# Patient Record
Sex: Female | Born: 1960
Health system: Southern US, Community
[De-identification: ages and names within clinical notes are randomized; demographics above are authoritative.]

## PROBLEM LIST (undated history)

## (undated) DIAGNOSIS — R7611 Nonspecific reaction to tuberculin skin test without active tuberculosis: Secondary | ICD-10-CM

## (undated) DIAGNOSIS — M869 Osteomyelitis, unspecified: Secondary | ICD-10-CM

## (undated) DIAGNOSIS — M545 Low back pain, unspecified: Secondary | ICD-10-CM

## (undated) DIAGNOSIS — D126 Benign neoplasm of colon, unspecified: Secondary | ICD-10-CM

## (undated) DIAGNOSIS — T7840XA Allergy, unspecified, initial encounter: Secondary | ICD-10-CM

## (undated) DIAGNOSIS — R9431 Abnormal electrocardiogram [ECG] [EKG]: Secondary | ICD-10-CM

## (undated) DIAGNOSIS — E0500 Thyrotoxicosis with diffuse goiter without thyrotoxic crisis or storm: Secondary | ICD-10-CM

## (undated) DIAGNOSIS — K5790 Diverticulosis of intestine, part unspecified, without perforation or abscess without bleeding: Secondary | ICD-10-CM

## (undated) DIAGNOSIS — I10 Essential (primary) hypertension: Secondary | ICD-10-CM

## (undated) DIAGNOSIS — Z85038 Personal history of other malignant neoplasm of large intestine: Secondary | ICD-10-CM

## (undated) DIAGNOSIS — G5621 Lesion of ulnar nerve, right upper limb: Secondary | ICD-10-CM

## (undated) DIAGNOSIS — G4730 Sleep apnea, unspecified: Secondary | ICD-10-CM

## (undated) DIAGNOSIS — D649 Anemia, unspecified: Secondary | ICD-10-CM

## (undated) DIAGNOSIS — E0590 Thyrotoxicosis, unspecified without thyrotoxic crisis or storm: Secondary | ICD-10-CM

## (undated) DIAGNOSIS — K219 Gastro-esophageal reflux disease without esophagitis: Secondary | ICD-10-CM

## (undated) DIAGNOSIS — M25562 Pain in left knee: Secondary | ICD-10-CM

## (undated) DIAGNOSIS — I639 Cerebral infarction, unspecified: Secondary | ICD-10-CM

## (undated) DIAGNOSIS — G8929 Other chronic pain: Secondary | ICD-10-CM

## (undated) DIAGNOSIS — Z89422 Acquired absence of other left toe(s): Secondary | ICD-10-CM

## (undated) DIAGNOSIS — K2970 Gastritis, unspecified, without bleeding: Secondary | ICD-10-CM

## (undated) DIAGNOSIS — M5010 Cervical disc disorder with radiculopathy, unspecified cervical region: Secondary | ICD-10-CM

## (undated) DIAGNOSIS — S161XXA Strain of muscle, fascia and tendon at neck level, initial encounter: Secondary | ICD-10-CM

## (undated) DIAGNOSIS — R002 Palpitations: Secondary | ICD-10-CM

## (undated) DIAGNOSIS — E039 Hypothyroidism, unspecified: Secondary | ICD-10-CM

## (undated) DIAGNOSIS — G43909 Migraine, unspecified, not intractable, without status migrainosus: Secondary | ICD-10-CM

## (undated) DIAGNOSIS — K449 Diaphragmatic hernia without obstruction or gangrene: Secondary | ICD-10-CM

## (undated) DIAGNOSIS — Z8719 Personal history of other diseases of the digestive system: Secondary | ICD-10-CM

## (undated) HISTORY — DX: Abnormal electrocardiogram (ECG) (EKG): R94.31

## (undated) HISTORY — PX: UPPER GASTROINTESTINAL ENDOSCOPY: SHX188

## (undated) HISTORY — PX: CHOLECYSTECTOMY: SHX55

## (undated) HISTORY — DX: Personal history of other malignant neoplasm of large intestine: Z85.038

## (undated) HISTORY — PX: COLONOSCOPY: SHX174

## (undated) HISTORY — DX: Benign neoplasm of colon, unspecified: D12.6

## (undated) HISTORY — DX: Strain of muscle, fascia and tendon at neck level, initial encounter: S16.1XXA

## (undated) HISTORY — DX: Lesion of ulnar nerve, right upper limb: G56.21

## (undated) HISTORY — DX: Palpitations: R00.2

## (undated) HISTORY — DX: Osteomyelitis, unspecified: M86.9

## (undated) HISTORY — PX: GALLBLADDER SURGERY: SHX652

## (undated) HISTORY — DX: Gastritis, unspecified, without bleeding: K29.70

## (undated) HISTORY — DX: Cervical disc disorder with radiculopathy, unspecified cervical region: M50.10

## (undated) HISTORY — DX: Diverticulosis of intestine, part unspecified, without perforation or abscess without bleeding: K57.90

## (undated) HISTORY — DX: Allergy, unspecified, initial encounter: T78.40XA

## (undated) HISTORY — DX: Sleep apnea, unspecified: G47.30

## (undated) HISTORY — DX: Cerebral infarction, unspecified: I63.9

## (undated) HISTORY — DX: Pain in left knee: M25.562

## (undated) HISTORY — DX: Acquired absence of other left toe(s): Z89.422

---

## 1988-06-27 HISTORY — PX: TUBAL LIGATION: SHX77

## 1998-01-22 ENCOUNTER — Encounter: Admission: RE | Admit: 1998-01-22 | Discharge: 1998-01-22 | Payer: Self-pay | Admitting: Family Medicine

## 1998-01-24 ENCOUNTER — Emergency Department (HOSPITAL_COMMUNITY): Admission: EM | Admit: 1998-01-24 | Discharge: 1998-01-25 | Payer: Self-pay | Admitting: Emergency Medicine

## 1998-05-04 ENCOUNTER — Encounter: Admission: RE | Admit: 1998-05-04 | Discharge: 1998-05-04 | Payer: Self-pay | Admitting: Family Medicine

## 1998-05-04 ENCOUNTER — Other Ambulatory Visit: Admission: RE | Admit: 1998-05-04 | Discharge: 1998-05-04 | Payer: Self-pay | Admitting: *Deleted

## 1998-11-23 ENCOUNTER — Encounter: Admission: RE | Admit: 1998-11-23 | Discharge: 1998-11-23 | Payer: Self-pay | Admitting: Family Medicine

## 1999-05-17 ENCOUNTER — Encounter: Admission: RE | Admit: 1999-05-17 | Discharge: 1999-05-17 | Payer: Self-pay | Admitting: Family Medicine

## 1999-05-31 ENCOUNTER — Encounter: Admission: RE | Admit: 1999-05-31 | Discharge: 1999-05-31 | Payer: Self-pay | Admitting: Family Medicine

## 2000-01-19 ENCOUNTER — Emergency Department (HOSPITAL_COMMUNITY): Admission: EM | Admit: 2000-01-19 | Discharge: 2000-01-19 | Payer: Self-pay | Admitting: Emergency Medicine

## 2000-02-01 ENCOUNTER — Encounter: Admission: RE | Admit: 2000-02-01 | Discharge: 2000-02-01 | Payer: Self-pay | Admitting: Family Medicine

## 2000-02-08 ENCOUNTER — Encounter: Admission: RE | Admit: 2000-02-08 | Discharge: 2000-02-08 | Payer: Self-pay | Admitting: Family Medicine

## 2000-03-08 ENCOUNTER — Encounter: Admission: RE | Admit: 2000-03-08 | Discharge: 2000-03-08 | Payer: Self-pay | Admitting: Family Medicine

## 2000-03-22 ENCOUNTER — Encounter: Admission: RE | Admit: 2000-03-22 | Discharge: 2000-03-22 | Payer: Self-pay | Admitting: Family Medicine

## 2000-04-21 ENCOUNTER — Encounter: Admission: RE | Admit: 2000-04-21 | Discharge: 2000-04-21 | Payer: Self-pay | Admitting: Family Medicine

## 2000-04-28 ENCOUNTER — Encounter: Admission: RE | Admit: 2000-04-28 | Discharge: 2000-04-28 | Payer: Self-pay | Admitting: *Deleted

## 2000-07-06 ENCOUNTER — Encounter: Admission: RE | Admit: 2000-07-06 | Discharge: 2000-07-06 | Payer: Self-pay | Admitting: Family Medicine

## 2000-07-28 ENCOUNTER — Encounter (INDEPENDENT_AMBULATORY_CARE_PROVIDER_SITE_OTHER): Payer: Self-pay | Admitting: *Deleted

## 2000-07-28 LAB — CONVERTED CEMR LAB

## 2000-08-06 ENCOUNTER — Emergency Department (HOSPITAL_COMMUNITY): Admission: EM | Admit: 2000-08-06 | Discharge: 2000-08-06 | Payer: Self-pay | Admitting: Internal Medicine

## 2000-08-06 ENCOUNTER — Encounter: Payer: Self-pay | Admitting: Internal Medicine

## 2000-08-11 ENCOUNTER — Other Ambulatory Visit: Admission: RE | Admit: 2000-08-11 | Discharge: 2000-08-11 | Payer: Self-pay | Admitting: Family Medicine

## 2000-09-06 ENCOUNTER — Encounter: Admission: RE | Admit: 2000-09-06 | Discharge: 2000-09-06 | Payer: Self-pay | Admitting: Family Medicine

## 2001-05-17 ENCOUNTER — Other Ambulatory Visit: Admission: RE | Admit: 2001-05-17 | Discharge: 2001-06-11 | Payer: Self-pay | Admitting: Unknown Physician Specialty

## 2001-05-21 ENCOUNTER — Ambulatory Visit (HOSPITAL_COMMUNITY): Admission: RE | Admit: 2001-05-21 | Discharge: 2001-05-21 | Payer: Self-pay | Admitting: Family Medicine

## 2001-05-22 ENCOUNTER — Ambulatory Visit (HOSPITAL_COMMUNITY): Admission: RE | Admit: 2001-05-22 | Discharge: 2001-05-22 | Payer: Self-pay | Admitting: *Deleted

## 2001-05-22 ENCOUNTER — Encounter: Payer: Self-pay | Admitting: *Deleted

## 2001-10-20 ENCOUNTER — Emergency Department (HOSPITAL_COMMUNITY): Admission: EM | Admit: 2001-10-20 | Discharge: 2001-10-20 | Payer: Self-pay | Admitting: Emergency Medicine

## 2001-10-22 ENCOUNTER — Encounter: Payer: Self-pay | Admitting: Emergency Medicine

## 2001-10-22 ENCOUNTER — Emergency Department (HOSPITAL_COMMUNITY): Admission: EM | Admit: 2001-10-22 | Discharge: 2001-10-22 | Payer: Self-pay | Admitting: Emergency Medicine

## 2002-01-08 ENCOUNTER — Encounter: Admission: RE | Admit: 2002-01-08 | Discharge: 2002-04-08 | Payer: Self-pay | Admitting: Internal Medicine

## 2002-02-14 ENCOUNTER — Encounter: Payer: Self-pay | Admitting: Internal Medicine

## 2002-02-14 ENCOUNTER — Encounter: Admission: RE | Admit: 2002-02-14 | Discharge: 2002-02-14 | Payer: Self-pay | Admitting: Internal Medicine

## 2002-08-12 ENCOUNTER — Other Ambulatory Visit: Admission: RE | Admit: 2002-08-12 | Discharge: 2002-08-12 | Payer: Self-pay | Admitting: Obstetrics and Gynecology

## 2002-12-07 ENCOUNTER — Emergency Department (HOSPITAL_COMMUNITY): Admission: AD | Admit: 2002-12-07 | Discharge: 2002-12-07 | Payer: Self-pay

## 2003-01-25 ENCOUNTER — Emergency Department (HOSPITAL_COMMUNITY): Admission: EM | Admit: 2003-01-25 | Discharge: 2003-01-25 | Payer: Self-pay | Admitting: Emergency Medicine

## 2003-03-25 ENCOUNTER — Encounter: Admission: RE | Admit: 2003-03-25 | Discharge: 2003-03-25 | Payer: Self-pay | Admitting: Internal Medicine

## 2003-03-25 ENCOUNTER — Encounter: Payer: Self-pay | Admitting: Internal Medicine

## 2003-04-14 ENCOUNTER — Emergency Department (HOSPITAL_COMMUNITY): Admission: AD | Admit: 2003-04-14 | Discharge: 2003-04-14 | Payer: Self-pay | Admitting: Family Medicine

## 2003-04-27 ENCOUNTER — Emergency Department (HOSPITAL_COMMUNITY): Admission: EM | Admit: 2003-04-27 | Discharge: 2003-04-27 | Payer: Self-pay | Admitting: Emergency Medicine

## 2003-10-16 ENCOUNTER — Emergency Department (HOSPITAL_COMMUNITY): Admission: EM | Admit: 2003-10-16 | Discharge: 2003-10-16 | Payer: Self-pay | Admitting: Emergency Medicine

## 2003-12-09 ENCOUNTER — Encounter: Admission: RE | Admit: 2003-12-09 | Discharge: 2003-12-09 | Payer: Self-pay | Admitting: Internal Medicine

## 2004-06-27 HISTORY — PX: ABDOMINAL HYSTERECTOMY: SHX81

## 2004-06-27 HISTORY — PX: APPENDECTOMY: SHX54

## 2004-07-31 ENCOUNTER — Emergency Department (HOSPITAL_COMMUNITY): Admission: EM | Admit: 2004-07-31 | Discharge: 2004-07-31 | Payer: Self-pay | Admitting: Emergency Medicine

## 2004-08-07 ENCOUNTER — Ambulatory Visit (HOSPITAL_COMMUNITY): Admission: RE | Admit: 2004-08-07 | Discharge: 2004-08-07 | Payer: Self-pay | Admitting: Internal Medicine

## 2004-09-18 ENCOUNTER — Emergency Department (HOSPITAL_COMMUNITY): Admission: EM | Admit: 2004-09-18 | Discharge: 2004-09-18 | Payer: Self-pay | Admitting: Emergency Medicine

## 2004-09-20 ENCOUNTER — Emergency Department (HOSPITAL_COMMUNITY): Admission: EM | Admit: 2004-09-20 | Discharge: 2004-09-21 | Payer: Self-pay | Admitting: Emergency Medicine

## 2004-09-21 ENCOUNTER — Inpatient Hospital Stay (HOSPITAL_COMMUNITY): Admission: AD | Admit: 2004-09-21 | Discharge: 2004-09-24 | Payer: Self-pay | Admitting: Internal Medicine

## 2004-10-18 ENCOUNTER — Other Ambulatory Visit: Admission: RE | Admit: 2004-10-18 | Discharge: 2004-10-18 | Payer: Self-pay | Admitting: Obstetrics and Gynecology

## 2004-11-02 ENCOUNTER — Encounter (INDEPENDENT_AMBULATORY_CARE_PROVIDER_SITE_OTHER): Payer: Self-pay | Admitting: Specialist

## 2004-11-02 ENCOUNTER — Inpatient Hospital Stay (HOSPITAL_COMMUNITY): Admission: RE | Admit: 2004-11-02 | Discharge: 2004-11-04 | Payer: Self-pay | Admitting: Obstetrics and Gynecology

## 2004-11-12 ENCOUNTER — Inpatient Hospital Stay (HOSPITAL_COMMUNITY): Admission: AD | Admit: 2004-11-12 | Discharge: 2004-11-12 | Payer: Self-pay | Admitting: Obstetrics and Gynecology

## 2004-11-14 ENCOUNTER — Encounter: Payer: Self-pay | Admitting: Obstetrics and Gynecology

## 2004-11-14 ENCOUNTER — Observation Stay (HOSPITAL_COMMUNITY): Admission: AD | Admit: 2004-11-14 | Discharge: 2004-11-15 | Payer: Self-pay | Admitting: General Surgery

## 2004-11-15 ENCOUNTER — Encounter (INDEPENDENT_AMBULATORY_CARE_PROVIDER_SITE_OTHER): Payer: Self-pay | Admitting: Specialist

## 2005-10-17 ENCOUNTER — Encounter (HOSPITAL_COMMUNITY): Admission: RE | Admit: 2005-10-17 | Discharge: 2006-01-15 | Payer: Self-pay | Admitting: Internal Medicine

## 2006-03-24 ENCOUNTER — Emergency Department (HOSPITAL_COMMUNITY): Admission: EM | Admit: 2006-03-24 | Discharge: 2006-03-24 | Payer: Self-pay | Admitting: Emergency Medicine

## 2006-08-14 ENCOUNTER — Ambulatory Visit (HOSPITAL_BASED_OUTPATIENT_CLINIC_OR_DEPARTMENT_OTHER): Admission: RE | Admit: 2006-08-14 | Discharge: 2006-08-14 | Payer: Self-pay | Admitting: Ophthalmology

## 2006-08-25 ENCOUNTER — Encounter (INDEPENDENT_AMBULATORY_CARE_PROVIDER_SITE_OTHER): Payer: Self-pay | Admitting: *Deleted

## 2006-12-27 ENCOUNTER — Encounter: Admission: RE | Admit: 2006-12-27 | Discharge: 2006-12-27 | Payer: Self-pay | Admitting: Internal Medicine

## 2007-01-01 ENCOUNTER — Encounter: Admission: RE | Admit: 2007-01-01 | Discharge: 2007-04-01 | Payer: Self-pay | Admitting: Internal Medicine

## 2007-01-03 ENCOUNTER — Emergency Department (HOSPITAL_COMMUNITY): Admission: EM | Admit: 2007-01-03 | Discharge: 2007-01-03 | Payer: Self-pay | Admitting: Emergency Medicine

## 2007-04-30 ENCOUNTER — Encounter: Admission: RE | Admit: 2007-04-30 | Discharge: 2007-04-30 | Payer: Self-pay | Admitting: Internal Medicine

## 2007-05-03 ENCOUNTER — Encounter: Admission: RE | Admit: 2007-05-03 | Discharge: 2007-05-03 | Payer: Self-pay | Admitting: Internal Medicine

## 2007-05-25 ENCOUNTER — Emergency Department (HOSPITAL_COMMUNITY): Admission: EM | Admit: 2007-05-25 | Discharge: 2007-05-25 | Payer: Self-pay | Admitting: Emergency Medicine

## 2007-06-06 ENCOUNTER — Encounter: Admission: RE | Admit: 2007-06-06 | Discharge: 2007-06-06 | Payer: Self-pay | Admitting: Orthopaedic Surgery

## 2007-09-21 ENCOUNTER — Encounter: Admission: RE | Admit: 2007-09-21 | Discharge: 2007-09-21 | Payer: Self-pay | Admitting: Obstetrics and Gynecology

## 2008-08-10 ENCOUNTER — Emergency Department (HOSPITAL_COMMUNITY): Admission: EM | Admit: 2008-08-10 | Discharge: 2008-08-10 | Payer: Self-pay | Admitting: Emergency Medicine

## 2009-04-07 ENCOUNTER — Emergency Department (HOSPITAL_COMMUNITY): Admission: EM | Admit: 2009-04-07 | Discharge: 2009-04-07 | Payer: Self-pay | Admitting: Emergency Medicine

## 2009-04-09 ENCOUNTER — Emergency Department (HOSPITAL_COMMUNITY): Admission: EM | Admit: 2009-04-09 | Discharge: 2009-04-09 | Payer: Self-pay | Admitting: Emergency Medicine

## 2009-09-29 ENCOUNTER — Emergency Department (HOSPITAL_COMMUNITY): Admission: EM | Admit: 2009-09-29 | Discharge: 2009-09-29 | Payer: Self-pay | Admitting: Emergency Medicine

## 2010-02-09 ENCOUNTER — Emergency Department (HOSPITAL_COMMUNITY): Admission: EM | Admit: 2010-02-09 | Discharge: 2010-02-10 | Payer: Self-pay | Admitting: Emergency Medicine

## 2010-07-18 ENCOUNTER — Encounter: Payer: Self-pay | Admitting: Internal Medicine

## 2010-09-09 LAB — DIFFERENTIAL
Basophils Absolute: 0 10*3/uL (ref 0.0–0.1)
Basophils Relative: 0 % (ref 0–1)
Eosinophils Absolute: 0.1 10*3/uL (ref 0.0–0.7)
Eosinophils Relative: 1 % (ref 0–5)
Lymphocytes Relative: 30 % (ref 12–46)
Lymphs Abs: 1.8 10*3/uL (ref 0.7–4.0)
Monocytes Absolute: 0.4 10*3/uL (ref 0.1–1.0)
Monocytes Relative: 7 % (ref 3–12)
Neutro Abs: 3.8 10*3/uL (ref 1.7–7.7)
Neutrophils Relative %: 63 % (ref 43–77)

## 2010-09-09 LAB — URINALYSIS, ROUTINE W REFLEX MICROSCOPIC
Bilirubin Urine: NEGATIVE
Glucose, UA: NEGATIVE mg/dL
Ketones, ur: NEGATIVE mg/dL
Leukocytes, UA: NEGATIVE
Nitrite: NEGATIVE
Protein, ur: NEGATIVE mg/dL
Specific Gravity, Urine: 1.015 (ref 1.005–1.030)
Urobilinogen, UA: 1 mg/dL (ref 0.0–1.0)
pH: 6.5 (ref 5.0–8.0)

## 2010-09-09 LAB — RAPID URINE DRUG SCREEN, HOSP PERFORMED
Amphetamines: NOT DETECTED
Barbiturates: NOT DETECTED
Benzodiazepines: NOT DETECTED
Cocaine: NOT DETECTED
Opiates: NOT DETECTED
Tetrahydrocannabinol: NOT DETECTED

## 2010-09-09 LAB — COMPREHENSIVE METABOLIC PANEL
ALT: 19 U/L (ref 0–35)
AST: 26 U/L (ref 0–37)
Albumin: 3.6 g/dL (ref 3.5–5.2)
Alkaline Phosphatase: 58 U/L (ref 39–117)
BUN: 10 mg/dL (ref 6–23)
CO2: 29 mEq/L (ref 19–32)
Calcium: 9.1 mg/dL (ref 8.4–10.5)
Chloride: 104 mEq/L (ref 96–112)
Creatinine, Ser: 0.97 mg/dL (ref 0.4–1.2)
GFR calc Af Amer: >60 mL/min (ref >60)
GFR calc non Af Amer: >60 mL/min (ref >60)
Glucose, Bld: 186 mg/dL — ABNORMAL HIGH (ref 70–99)
Potassium: 3.2 mEq/L — ABNORMAL LOW (ref 3.5–5.1)
Sodium: 139 mEq/L (ref 135–145)
Total Bilirubin: 0.8 mg/dL (ref 0.3–1.2)
Total Protein: 6.5 g/dL (ref 6.0–8.3)

## 2010-09-09 LAB — CBC
HCT: 37.4 % (ref 36.0–46.0)
Hemoglobin: 12.6 g/dL (ref 12.0–15.0)
MCH: 27.6 pg (ref 26.0–34.0)
MCHC: 33.7 g/dL (ref 30.0–36.0)
MCV: 81.8 fL (ref 78.0–100.0)
Platelets: 271 10*3/uL (ref 150–400)
RBC: 4.57 MIL/uL (ref 3.87–5.11)
RDW: 13.9 % (ref 11.5–15.5)
WBC: 6.1 10*3/uL (ref 4.0–10.5)

## 2010-09-09 LAB — URINE MICROSCOPIC-ADD ON

## 2010-09-09 LAB — LIPASE, BLOOD: Lipase: 26 U/L (ref 11–59)

## 2010-09-15 LAB — DIFFERENTIAL
Basophils Absolute: 0 10*3/uL (ref 0.0–0.1)
Basophils Relative: 0 % (ref 0–1)
Eosinophils Absolute: 0.2 10*3/uL (ref 0.0–0.7)
Eosinophils Relative: 3 % (ref 0–5)
Lymphocytes Relative: 36 % (ref 12–46)
Lymphs Abs: 2.8 10*3/uL (ref 0.7–4.0)
Monocytes Absolute: 0.3 10*3/uL (ref 0.1–1.0)
Monocytes Relative: 4 % (ref 3–12)
Neutro Abs: 4.6 10*3/uL (ref 1.7–7.7)
Neutrophils Relative %: 57 % (ref 43–77)

## 2010-09-15 LAB — URINALYSIS, ROUTINE W REFLEX MICROSCOPIC
Bilirubin Urine: NEGATIVE
Glucose, UA: NEGATIVE mg/dL
Ketones, ur: NEGATIVE mg/dL
Leukocytes, UA: NEGATIVE
Nitrite: NEGATIVE
Protein, ur: NEGATIVE mg/dL
Specific Gravity, Urine: 1.014 (ref 1.005–1.030)
Urobilinogen, UA: 0.2 mg/dL (ref 0.0–1.0)
pH: 7 (ref 5.0–8.0)

## 2010-09-15 LAB — COMPREHENSIVE METABOLIC PANEL
ALT: 22 U/L (ref 0–35)
AST: 20 U/L (ref 0–37)
Albumin: 3.9 g/dL (ref 3.5–5.2)
Alkaline Phosphatase: 57 U/L (ref 39–117)
BUN: 16 mg/dL (ref 6–23)
CO2: 24 mEq/L (ref 19–32)
Calcium: 9.2 mg/dL (ref 8.4–10.5)
Chloride: 107 mEq/L (ref 96–112)
Creatinine, Ser: 0.89 mg/dL (ref 0.4–1.2)
GFR calc Af Amer: >60 mL/min (ref >60)
GFR calc non Af Amer: >60 mL/min (ref >60)
Glucose, Bld: 91 mg/dL (ref 70–99)
Potassium: 3.3 mEq/L — ABNORMAL LOW (ref 3.5–5.1)
Sodium: 139 mEq/L (ref 135–145)
Total Bilirubin: 0.5 mg/dL (ref 0.3–1.2)
Total Protein: 7 g/dL (ref 6.0–8.3)

## 2010-09-15 LAB — CBC
HCT: 40.1 % (ref 36.0–46.0)
Hemoglobin: 13.4 g/dL (ref 12.0–15.0)
MCHC: 33.3 g/dL (ref 30.0–36.0)
MCV: 84.4 fL (ref 78.0–100.0)
Platelets: 229 10*3/uL (ref 150–400)
RBC: 4.75 MIL/uL (ref 3.87–5.11)
RDW: 14.1 % (ref 11.5–15.5)
WBC: 7.9 10*3/uL (ref 4.0–10.5)

## 2010-09-15 LAB — CK: Total CK: 142 U/L (ref 7–177)

## 2010-09-15 LAB — URINE MICROSCOPIC-ADD ON

## 2010-09-30 LAB — URINALYSIS, ROUTINE W REFLEX MICROSCOPIC
Glucose, UA: NEGATIVE mg/dL
Hgb urine dipstick: NEGATIVE
Ketones, ur: NEGATIVE mg/dL
Nitrite: NEGATIVE
Protein, ur: NEGATIVE mg/dL
Specific Gravity, Urine: 1.027 (ref 1.005–1.030)
Urobilinogen, UA: 1 mg/dL (ref 0.0–1.0)
pH: 6 (ref 5.0–8.0)

## 2010-09-30 LAB — BASIC METABOLIC PANEL
BUN: 9 mg/dL (ref 6–23)
CO2: 28 mEq/L (ref 19–32)
Calcium: 9.8 mg/dL (ref 8.4–10.5)
Chloride: 107 mEq/L (ref 96–112)
Creatinine, Ser: 0.99 mg/dL (ref 0.4–1.2)
GFR calc Af Amer: >60 mL/min (ref >60)
GFR calc non Af Amer: >60 mL/min (ref >60)
Glucose, Bld: 89 mg/dL (ref 70–99)
Potassium: 3.5 mEq/L (ref 3.5–5.1)
Sodium: 142 mEq/L (ref 135–145)

## 2010-09-30 LAB — POCT I-STAT, CHEM 8
BUN: 16 mg/dL (ref 6–23)
Calcium, Ion: 1.18 mmol/L (ref 1.12–1.32)
Chloride: 107 mEq/L (ref 96–112)
Creatinine, Ser: 1 mg/dL (ref 0.4–1.2)
Glucose, Bld: 91 mg/dL (ref 70–99)
HCT: 39 % (ref 36.0–46.0)
Hemoglobin: 13.3 g/dL (ref 12.0–15.0)
Potassium: 3.5 mEq/L (ref 3.5–5.1)
Sodium: 141 mEq/L (ref 135–145)
TCO2: 23 mmol/L (ref 0–100)

## 2010-09-30 LAB — DIFFERENTIAL
Basophils Absolute: 0.1 10*3/uL (ref 0.0–0.1)
Basophils Relative: 1 % (ref 0–1)
Eosinophils Absolute: 0.2 10*3/uL (ref 0.0–0.7)
Eosinophils Relative: 2 % (ref 0–5)
Lymphocytes Relative: 32 % (ref 12–46)
Lymphs Abs: 2.8 10*3/uL (ref 0.7–4.0)
Monocytes Absolute: 0.4 10*3/uL (ref 0.1–1.0)
Monocytes Relative: 5 % (ref 3–12)
Neutro Abs: 5.2 10*3/uL (ref 1.7–7.7)
Neutrophils Relative %: 60 % (ref 43–77)

## 2010-09-30 LAB — CBC
HCT: 42.1 % (ref 36.0–46.0)
Hemoglobin: 14 g/dL (ref 12.0–15.0)
MCHC: 33.3 g/dL (ref 30.0–36.0)
MCV: 85.1 fL (ref 78.0–100.0)
Platelets: 236 10*3/uL (ref 150–400)
RBC: 4.95 MIL/uL (ref 3.87–5.11)
RDW: 13.8 % (ref 11.5–15.5)
WBC: 8.7 10*3/uL (ref 4.0–10.5)

## 2010-10-10 ENCOUNTER — Emergency Department (HOSPITAL_COMMUNITY): Payer: Self-pay

## 2010-10-10 ENCOUNTER — Emergency Department (HOSPITAL_COMMUNITY)
Admission: EM | Admit: 2010-10-10 | Discharge: 2010-10-10 | Disposition: A | Discharge status: Home / Self Care | Payer: Self-pay | Attending: Emergency Medicine | Admitting: Emergency Medicine

## 2010-10-10 DIAGNOSIS — M79609 Pain in unspecified limb: Secondary | ICD-10-CM | POA: Insufficient documentation

## 2010-10-10 DIAGNOSIS — F17200 Nicotine dependence, unspecified, uncomplicated: Secondary | ICD-10-CM | POA: Insufficient documentation

## 2010-10-10 DIAGNOSIS — R209 Unspecified disturbances of skin sensation: Secondary | ICD-10-CM | POA: Insufficient documentation

## 2010-10-10 DIAGNOSIS — M62838 Other muscle spasm: Secondary | ICD-10-CM | POA: Insufficient documentation

## 2010-10-10 DIAGNOSIS — I10 Essential (primary) hypertension: Secondary | ICD-10-CM | POA: Insufficient documentation

## 2010-10-10 LAB — BASIC METABOLIC PANEL
BUN: 12 mg/dL (ref 6–23)
CO2: 27 mEq/L (ref 19–32)
Calcium: 9.4 mg/dL (ref 8.4–10.5)
Chloride: 100 mEq/L (ref 96–112)
Creatinine, Ser: 1.03 mg/dL (ref 0.4–1.2)
GFR calc Af Amer: >60 mL/min (ref >60)
GFR calc non Af Amer: 57 mL/min — ABNORMAL LOW (ref >60)
Glucose, Bld: 100 mg/dL — ABNORMAL HIGH (ref 70–99)
Potassium: 3.3 mEq/L — ABNORMAL LOW (ref 3.5–5.1)
Sodium: 138 mEq/L (ref 135–145)

## 2010-10-10 LAB — CBC
HCT: 42.4 % (ref 36.0–46.0)
Hemoglobin: 14.5 g/dL (ref 12.0–15.0)
MCH: 27.7 pg (ref 26.0–34.0)
MCHC: 34.2 g/dL (ref 30.0–36.0)
MCV: 81.1 fL (ref 78.0–100.0)
Platelets: 293 10*3/uL (ref 150–400)
RBC: 5.23 MIL/uL — ABNORMAL HIGH (ref 3.87–5.11)
RDW: 14.1 % (ref 11.5–15.5)
WBC: 10.9 10*3/uL — ABNORMAL HIGH (ref 4.0–10.5)

## 2010-10-10 LAB — DIFFERENTIAL
Basophils Absolute: 0.1 10*3/uL (ref 0.0–0.1)
Basophils Relative: 1 % (ref 0–1)
Eosinophils Absolute: 0.4 10*3/uL (ref 0.0–0.7)
Eosinophils Relative: 4 % (ref 0–5)
Lymphocytes Relative: 31 % (ref 12–46)
Lymphs Abs: 3.4 10*3/uL (ref 0.7–4.0)
Monocytes Absolute: 0.7 10*3/uL (ref 0.1–1.0)
Monocytes Relative: 7 % (ref 3–12)
Neutro Abs: 6.3 10*3/uL (ref 1.7–7.7)
Neutrophils Relative %: 58 % (ref 43–77)

## 2010-10-10 LAB — CK TOTAL AND CKMB (NOT AT ARMC)
CK, MB: 0.9 ng/mL (ref 0.3–4.0)
Relative Index: 0.8 (ref 0.0–2.5)
Total CK: 108 U/L (ref 7–177)

## 2010-10-10 LAB — TROPONIN I: Troponin I: 0.02 ng/mL (ref 0.00–0.06)

## 2010-11-12 NOTE — Op Note (Signed)
Teresa [name], Teresa [name]               ACCOUNT NO.:  [id number]   MEDICAL RECORD NO.:  [id number]          PATIENT TYPE:  OBV   LOCATION:  9399                          FACILITY:  [facility]   PHYSICIAN:  Hal Morales, M.D.DATE OF BIRTH:  [birth year]   DATE OF PROCEDURE:  11/02/2004  DATE OF DISCHARGE:                                 OPERATIVE REPORT   PREOPERATIVE DIAGNOSES:  Menometrorrhagia, severe dysmenorrhea.   POSTOPERATIVE DIAGNOSES:  Menometrorrhagia, severe dysmenorrhea. Uterine  fibroids, pelvic adhesions, rule out endometriosis and simple left ovarian  cyst.   PROCEDURE:  Open laparoscopy total abdominal hysterectomy, bilateral  salpingectomy with lysis of adhesions.   SURGEON:  Hal Morales, M.D.   FIRST ASSISTANT:  Elmira J. Adline Peals.  Student; Channing Carter   ANESTHESIA:  General orotracheal.   ESTIMATED BLOOD LOSS:  250 mL.   COMPLICATIONS:  None.   FINDINGS:  The uterus was upper limits of normal size with a 2 cm myoma on  the anterior fundus. The tubes were status post interruption for tubal  sterilization. The ovaries were normal bilaterally with a simple left  ovarian cyst measuring approximately 2 cm. There was a peritoneal window in  the posterior cul-de-sac near the uterosacral ligament that could be  consistent with endometriosis. There were no other stigmata of  endometriosis. There were adhesions to the left round ligament from the left  pelvic side Danzer. There were adhesions in the bladder flap area.   PROCEDURE:  The patient was taken to the operating room after appropriate  identification and placed on the operating table. After the attainment of  adequate general anesthesia she was placed in modified lithotomy position.  The abdomen, perineum and vagina were prepped with multiple layers of  Betadine. A Foley catheter was inserted into the bladder and connected to  straight drainage. A Hulka tenaculum was placed on the anterior cervix. The  abdomen and perineum were draped as a sterile field. Subumbilical and  suprapubic injection of 0.25% Marcaine for total of 10 mL was undertaken. A  subumbilical incision was made and the abdomen opened in layers to enter the  peritoneal cavity. The Hassan cannula was placed through that incision into  the peritoneal cavity and pneumoperitoneum was created with 3 liters of CO2.  Laparoscope was placed through that trocar sleeve and suprapubic incisions  to the right and left of midline were made. Laparoscopic probe trocars were  placed through those incisions into the peritoneal cavity under direct  visualization. The above-noted findings were made. The right round ligament  was cauterized and cut and the anterior leaf of the broad ligament incised  down to the level of the bladder flap. Here it was clear that dense  adhesions of the bladder flap would make it difficult to take the bladder  down either vaginally or laparoscopically. Likewise, it was noted that there  was limited mobility of the uterus because of the aforementioned adhesions.  At this time the decision was made to proceed with abdominal hysterectomy  and all instruments were removed from the peritoneal cavity. The  subumbilical incision was  closed with fascial sutures of 0 Vicryl and a  subcuticular suture of 3-0 Vicryl. The suprapubic incisions were then  connected to complete a suprapubic transverse incision and the abdomen  opened in layers. Peritoneum was entered and a self-retaining retractor was  placed in the peritoneal cavity. The uterus was elevated with Kelly clamps  at each cornual region. The utero-ovarian ligament on the right side was  clamped, cut, tied with free tie and suture ligated.  The left round  ligament was dissected away from the aforementioned adhesions with a  combination of blunt and sharp dissection. Once this had been skeletonized.  It was suture ligated and incised. That incision was taken toward  the  bladder. The pubocervical fascia was then identified by incising the  uterocervical junction and the bladder dissected sharply off the anterior  cervix. The left utero-ovarian ligament was then clamped, cut, tied with  free tie and suture ligated. The uterine arteries on the right and left side  were skeletonized, clamped, cut and suture ligated. The parametrial tissues,  paracervical tissues, uterosacral ligaments and vaginal angles were then  successively clamped, cut and suture ligated. The sutures on the vaginal  angles and uterosacral ligaments were held. The remainder of the uterus and  cervix were excised from the upper vagina and the vaginal cuff closed with  figure-of-eight sutures. All sutures to this point were 0 Vicryl. Copious  irrigation carried out and a figure-of-eight suture of 3-0 Vicryl was used  to achieve hemostasis on the posterior bladder. The distal right fallopian  tube was then excised after clamping with Kelly clamp and suture ligating.  The sutures on the uterosacral ligaments and vaginal angles on each side  were tied together and all instruments removed from the abdominal cavity  after having ensured that hemostasis was adequate. At this time sponge and  instrument counts were correct and the abdominal peritoneum closed running  suture of 2-0 Vicryl. The rectus fascia was closed with running sutures and  reinforced on either side of midline with figure-of-eight sutures of 0  Vicryl. The subcutaneous tissue was copiously irrigated and made hemostatic  with Bovie cautery. A subcuticular suture of 3-0 Monocryl was used to close  the skin incision. Sterile dressing was applied and the patient awakened  from general anesthesia and taken to the recovery room in satisfactory  condition having tolerated the procedure well with sponge and instrument  counts correct.   SPECIMENS TO PATHOLOGY:  Uterus and cervix, and portions of right and left  tube.       VPH/MEDQ  D:  11/02/2004  T:  11/02/2004  Job:  [id number]

## 2010-11-12 NOTE — Consult Note (Signed)
Teresa [name], [name]               ACCOUNT NO.:  [id number]   MEDICAL RECORD NO.:  [id number]          PATIENT TYPE:  INP   LOCATION:  2013                         FACILITY:  [facility]   PHYSICIAN:  Hal Morales, M.D.DATE OF BIRTH:  [birth year]   DATE OF CONSULTATION:  09/23/2004  DATE OF DISCHARGE:                                   CONSULTATION   HISTORY OF THE PRESENT ILLNESS:  The patient is a 50 year old black female  para 3-0-0-3 who is followed by [facility] for  menorrhagia and dysmenorrhea.  The patient was admitted on September 21, 2004  for management of the acute onset of right lower quadrant pain, right flank  pain, and radiating pain to the right leg which began on September 18, 2004.   The patient has a long history of dysmenorrhea with severe menstrual cramps  with her periods.  She also has a long history of irregular menses, and  after extensive workup and numerous discussions the patient has made a  decision that she will undergo hysterectomy.  She was in the process of  waiting for the scheduling of this procedure when she was admitted to the  hospital.  She denies any vomiting, though she has had nausea.  She has had  no constipation, but did have an episode of diarrhea on September 18, 2004.  She  stated that the pain began at her umbilicus and then moved to the right  lower quadrant and radiated to her back.  At its worst, she was unable to  walk during the time of this pain.  She was seen in the emergency department  and was discharged home with Vicodin.  She took the Vicodin for most of the  day on September 19, 2004, with some relief of her pain.  By the morning of  September 20, 2004, however, the pain had become extremely severe, and she  presented to her primary care physician, Dr. Concepcion Elk, for evaluation.  He  ordered a CT scan that was indeterminate for appendicitis originally, and  requested a general surgery consultation in the emergency room.   The patient  was unable to be given certainty that appendicitis was the etiology of her  pain, and she declined surgical intervention at that time.  It was  recommended that she undergo gynecologic evaluation because a small cyst was  seen on the left ovary at the time of the CT scan.  She was evaluated in our  office on September 21, 2004, with no specific gynecologic etiology found for  her right lower quadrant pain.  Dr. Concepcion Elk was notified at that time, and  directly admitted her to the hospital.  A repeat CT scan was read as  negative for appendicitis, and the left adnexal cystic mass remained.  On  September 22, 2004, a gyn consultation was requested.  An ultrasound was  ordered, which showed a normal-sized uterus, thin endometrium, normal right  ovary, and a left ovary with a 3.2 x 2.7 x 2.3 cm mildly hyperechoic nodule  in the left ovary, with a nonspecific appearance that  could represent a  hemorrhagic cyst or potentially a small dermoid, though the Hounsfield  attenuation of 29 seemed to make a dermoid less likely.  Recommendation was  for followup in one to two months.   The last menstrual period began on September 02, 2004 and lasted for five days,  with no subsequent bleeding.  Her menses typically are irregular and very  heavy.  This last menstrual period was less heavy than usual.   PAST MEDICAL HISTORY:  1.  Hypertension.  2.  Allergies.  3.  Hiatal hernia.   SURGICAL HISTORY:  Cesarean sections x 3.   REVIEW OF SYSTEMS:  As noted above.   EXAMINATION:  GENERAL:  The patient is a well-developed black female in no  acute distress.  VITAL SIGNS:  Temperature 98.1; pulse 70; respirations 20; blood pressure  159/99.  ABDOMEN:  Soft, without masses or organomegaly.  There is a well-healed  midline incision.  There is some tenderness to deep palpation in the right  lower quadrant, with no rebound.  PELVIC:  EGBUS within normal limits.  The speculum examination is deferred,  as it  was performed in our office on September 21, 2004 and was felt to be  unremarkable.  Bimanual examination:  Cervix was mildly tender to lateral  motion.  The uterus was upper limits of normal size, mobile, and nontender.  The adnexae were without palpable masses.   LABORATORY STUDIES:  GC and Chlamydia cultures from February 2006 were  negative.  Endometrial biopsy from February 2006 was benign.  Pap smear from  February 2006 was within normal limits.   IMPRESSION:  1.  Long history of irregular menses, dysmenorrhea, and menorrhagia, with      the patient having decided to undergo hysterectomy for management of      these gynecologic issues.  2.  Acute onset of right lower quadrant pain on September 18, 2004, which is now      improving, but without clear gynecologic etiology.  3.  Left ovarian cyst which appears of a non-neoplastic etiology, probably a      functional hemorrhagic corpus luteum.   DISPOSITION:  1.  The patient has decided to undergo hysterectomy for management of her      menorrhagia, dysmenorrhea, and that will be scheduled.  The left ovarian      cyst can be evaluated at that time.  I will contact the patient with the      date for the scheduling of her surgery.  2.  I agreed that the patient can be discharged once her hypertension and      hypokalemia are management, and we will contact her about followup for      her gynecologic concerns.      VPH/MEDQ  D:  09/23/2004  T:  09/23/2004  Job:  [id number]

## 2010-11-12 NOTE — Discharge Summary (Signed)
Teresa [name], Teresa [name]               ACCOUNT NO.:  [id number]   MEDICAL RECORD NO.:  [id number]          PATIENT TYPE:  INP   LOCATION:  9317                          FACILITY:  [facility]   PHYSICIAN:  Hal Morales, M.D.DATE OF BIRTH:  [birth year]   DATE OF ADMISSION:  11/02/2004  DATE OF DISCHARGE:  11/04/2004                                 DISCHARGE SUMMARY   DISCHARGE DIAGNOSES:  1.  Menometrorrhagia.  2.  Severe dysmenorrhea.  3.  Fibroid uterus.  4.  Pelvic adhesions.  5.  Simple left ovarian cyst.   PROCEDURE:  On the date of admission, the patient underwent an open  laparoscopy with a total abdominal hysterectomy, bilateral salpingectomy,  and lysis of adhesions.  Tolerated the procedures well.  The patient was  found to have an upper limits of normal size uterus weighing 158.2 grams  along with normal appearing right and left tubes and right ovary with a  simple cyst on her left ovary.   HISTORY OF PRESENT ILLNESS:  Teresa [name] is a 50 year old single African-  American female with a history of hypertension, para 3-0-1-3, who presents  for hysterectomy because of severe dysmenorrhea and menometrorrhagia.  Please see the patient's dictated history and physical examination for  details.   PREOPERATIVE PHYSICAL EXAMINATION:  VITAL SIGNS:  Blood pressure 124/80,  weight 168, height 5 feet 4-1/2 inches tall.  Temperature 98.3 degrees  Fahrenheit orally.  GENERAL:  Within normal limits.  However, do note on abdominal examination  the patient's bowel sounds were present, it was soft, the patient did have  diffuse lower quadrant tenderness with voluntary guarding, however, there  was no rebound or organomegaly.  PELVIC:  EGBUS was within normal limits.  The patient does have bilateral  inguinal tenderness, however, there is no adenopathy.  The vagina is rugous.  Cervix is nontender without lesions.  Uterus appears normal size, shape, and  consistency with tenderness over the fundal  region.  The adnexa was without  any palpable masses.  However, there was a left adnexal tenderness and a  palpable mass.  RECTOVAGINAL:  Without tenderness or masses.   HOSPITAL COURSE:  On the date of admission, the patient underwent  aforementioned procedures tolerating them all well.  Postoperative course  was marked by the patient having poor control of her hypertension, however,  she tolerated a postoperative hemoglobin of 10.5 without difficulty  (preoperative hemoglobin 12.9).  By postoperative day #2, the patient had  resumed bowel and bladder function and was therefore deemed ready for  discharge home with recommendation to follow up with her primary physician  at [facility] for further management of her chronic hypertension.   DISCHARGE MEDICATIONS:  1.  Phenergan 25 mg one tablet every 6 hours as needed for nausea.  2.  Iron 325 mg one tablet twice daily for 6 weeks.  3.  Colace 100 mg one tablet twice daily until bowel movements are regular.  4.  Tylox one to two tablets every 4 to 6 hours as needed for pain.  5.  Lotrel 5/10 one tablet daily.  6.  Hydrochlorothiazide 25 mg one tablet daily.  7.  Flexeril 10 mg one tablet three times a day as needed.   FOLLOW UP:  The patient is scheduled for a 6-week postoperative visit with  Dr. Pennie Rushing on December 07, 2004, at 11:30 a.m.  The patient is to call [facility] to schedule an appointment with Fleet Contras, M.D. for  management of her hypertension.   DISCHARGE INSTRUCTIONS:  The patient was given a copy of [facility] postoperative instruction sheet.  She is further advised to avoid  driving for 2 weeks, heavy lifting for 4 weeks, and intercourse for 6 weeks.  The patient's diet was without restriction.   PATHOLOGY:  Uterus and cervix with portions of left and right fallopian  tubes:  Cervix; squamous metaplasia, no dysplasia identified, proliferative  endometrium, no hyperplasia or carcinoma  identified.  Leiomyomata,  submucosal, intramural, and subserosal. Uterine serosa; focus fibrous  adhesions, no endometriosis or evidence of malignancy.  Right and left  fallopian tubes; findings consistent with previous tubal ligation.  Detached  mucosal tissue.  Benign squamous mucosa.       EJP/MEDQ  D:  12/14/2004  T:  12/14/2004  Job:  [id number]

## 2010-11-12 NOTE — Consult Note (Signed)
[name], Teresa [name]               ACCOUNT NO.:  [id number]   MEDICAL RECORD NO.:  [id number]          PATIENT TYPE:  EMS   LOCATION:  ED                           [facility]ACILITY[facility]   PHYSICIAN:  Anselm Pancoast. Weatherly, M.D.DATE OF BIRTH:  [birth year]   DATE OF CONSULTATION:  09/20/2004  DATE OF DISCHARGE:                                   CONSULTATION   CHIEF COMPLAINT:  Abdominal pain.   HISTORY OF PRESENT ILLNESS:  Teresa [name] is a 50 year old black female who  has been in the ER several times and the last one was here Saturday when she  was seen by Dr. Markham Jordan L. Effie Shy with kind of diffuse abdominal pain, several  loose bowel movements. She had laboratory studies with a urinalysis  performed with a white count of 7500 with no left shift, normal urinalysis,  and normal CMET. She was treated with pain medication. No definite diagnosis  made but not thought to be acute appendicitis. The patient about two years  ago was seen here in the ER with similar findings. Had a CT at that time  that showed a normal appendix with air and contrast in it and she was  released. The patient was given pain medication, Vicodin, and instructed to  follow with her regular physician if she was not improved. Today, she saw  [facility] and  his note is included. She complains of sharp abdominal pain, nausea. No  vomiting of two days duration and stated that she was treated with pain  medication of Vicodin with mild relief. She had diarrhea initially but now  is more like constipation and was nauseated but has not vomited.   When she was first seen, she was thought to have pain more on the right  lower quadrant and then sent for a CT at [facility] and then Dr.  Frederica Kuster supposedly read the x-ray and called Dr. Fleet Contras and  said there was mild enlargement in the middle of the appendix and requested  to go by and get the x-rays and  bring them and send a report of the those x-  rays. The patient did go back to [facility] and picked up the CT and  comes in and was with numerous family members initially.   On physical exam when I first examined her here, she was more tender in the  left lower quadrant with a kind of cramp and bowel sounds down the right  lower quadrant. I obtained a CBC and CMET. The CBC showed a white count of  6400, CMET was normal, and on review of the CTs I was unable to definitely  see any evidence of appendiceal stranding. I think what I see is an appendix  that may be slightly larger than normal but not any obvious periappendiceal  inflammation.   Dr. Sandria Bales. Ezzard Standing was here seeing a trauma patient in the adjacent room  and I got him to both examine the patient and review her CT. He likewise was  not impressed on her  abdominal exam and on review of the CTs we were never  able to definitely convince ourselves that we thought that this was  consistent with acute appendicitis.   I then called her primary care physician, Dr. Fleet Contras, who stated that  she frequently presents to his office with abdominal pain, has a back  problem, has sometimes pain on the left and sometimes on the right. She has  a history of fibroids. She saw Dr. Maris Berger [facility] approximately a month  ago and had not found any GYN pathology. Has no explanation of her pain and  he would recommend that we go ahead and remove the appendix just to rule out  this as a source of this recurring chronic pain. I likewise went back to CT  and found the old x-rays of two years ago and compared the picture side-to-  side and the midportion of the appendix is slightly larger now than it was  two years ago, though I agree with Dr. Suzie Portela even though we do not have an  official x-ray report that possibly the appendix is slightly larger.  However, on examination of the patient at this time, she is really not  tender on deep palpation  of the right lower quadrant.   At this point, I recommended to the patient that we proceed on with an open  appendectomy and she balked and said unless I guarantee that this was going  to control this recurring, chronic abdominal pain that has been reasonably  diffuse and not localized to the right lower quadrant, she did not want her  appendix removed at this time.   On physical exam now, she has active bowel sounds. Her pain is more on the  left lower quadrant probably related to the cramping from the CT contrast  and is certainly not muscle guarding or rebound tenderness well localized to  the right lower quadrant. She is afebrile and she has a normal white count.  I certainly do not think there is any urgency that she has to have an  appendectomy this evening. I have suggested that if she does not want to  proceed with having her appendix removed tonight then I would be happy to  see her in the office in follow-up either tomorrow or Wednesday and she  stated that she rather just go home since she has had these pains off and on  and not proceed with surgery tonight.   I had called the operating room to post her and now called and canceled her  and we will release her with no additional medications and suggest if she  returns to see [place] either in the morning office or return to the ER if she  has increasing right lower quadrant pain.      WJW/MEDQ  D:  09/20/2004  T:  09/21/2004  Job:  [id number]   cc:   Patient's chart   Fleet Contras, M.D.  [address]  [place]  [place] [address]  Fax: [phone]

## 2010-11-12 NOTE — H&P (Signed)
NAME:  Teresa [name], Teresa [name]               ACCOUNT NO.:  [id number]   MEDICAL RECORD NO.:  [id number]          PATIENT TYPE:  OBV   LOCATION:  9306                          FACILITY:  [facility]   PHYSICIAN:  Janine Limbo, M.D.DATE OF BIRTH:  26-Dec-1960   DATE OF ADMISSION:  11/14/2004  DATE OF DISCHARGE:                                HISTORY & PHYSICAL   HISTORY OF PRESENT ILLNESS:  Ms. Dancer is a 50 year old female, para 3-0-1-3,  who presents to the maternity admissions area at the [facility]  [facility] complaining of severe abdominal pain.  The patient had an  abdominal hysterectomy performed on Nov 02, 2004.  Operative findings at the  time included pelvic adhesions and a fibroid.  The patient's indications for  her hysterectomy were menometrorrhagia, dysmenorrhea, fibroids, and pelvic  adhesions.  The patient reports that she has experienced constipation  recently.  She has tried stool softeners and this was not successful in  helping her have a good bowel movement.  She was able to have a small amount  of a bowel movement.  She denies any rectal bleeding.  She denies vomiting  at home.  She did have a small amount of nausea.  Her last meal was at 7  p.m. on Nov 13, 2004.  The patient was seen on Nov 12, 2004 and was  diagnosed with a urinary tract infection.  She was treated with  ciprofloxacin.  The patient reports that on Nov 13, 2004 her upper abdominal  pain began to get severely and progressively worse.  She presents at this  time for evaluation.  She reports that her lower abdominal pain from her  surgery is actually slightly improved.   OBSTETRICAL HISTORY:  The patient has had three cesarean sections in the  past.   DRUG ALLERGIES:  The patient says that ASPIRIN and IBUPROFEN causes  gastrointestinal distress.  She reports that LATEX causes a rash.   PAST MEDICAL HISTORY:  The patient has a history of hypertension,  gastroesophageal reflux disease, degenerative disk  disease at the level of  L5-S1 with nerve root compression, migraine headaches, anemia, and a history  of ovarian cysts.   CURRENT MEDICATIONS:  1. Flexeril 10 mg one tablet as needed.  2. Lotrel 5/10 one tablet daily.  3. Hydrochlorothiazide 25 mg daily.  4. Ciprofloxacin 500 mg b.i.d.  5. Hydrocodone.     SURGICAL HISTORY:  The patient had a dilatation and curettage in 1986.  She  had a tubal ligation in 1990.   REVIEW OF SYSTEMS:  Please see history of present illness.   SOCIAL HISTORY:  The patient smokes one pack of cigarettes each day.  She  denies alcohol use and other recreational drug uses.  The patient is single  and she works as a Interior and spatial designer.   FAMILY HISTORY:  Noncontributory.   PHYSICAL EXAMINATION:  VITAL SIGNS:  Temperature is 98, pulse 96,  respirations 24, blood pressure is 181/107.  HEENT:  Patient is writhing in pain in the bed.  She does respond to  commands.  CHEST:  Clear.  HEART:  Regular rate and rhythm.  ABDOMEN:  Her abdomen is actually soft and there is no rebound tenderness.  Her bowel sounds are positive.  There is some voluntary guarding.  EXTREMITIES:  Her extremities appear to be grossly normal.  NEUROLOGIC EXAM:  Grossly normal.  PELVIC EXAM:  External genitalia is normal.  The vagina is tender to exam  but does not elicit the tenderness that the patient expresses when she comes  in.  Cervix is absent.  Uterus is absent.  Adnexa tenderness bilaterally but  the pain that the patient expresses is not generated on exam.   LABORATORY VALUES:  Her chemistries are within normal limits except for a  potassium of 3.2, a glucose of 118, and an albumin of 3.4.  Her white blood  cell count is 9300, her hemoglobin is 11.3, her hematocrit is 34.5%, her  platelet count is 520,000.   ASSESSMENT:  1. Upper abdominal pain of uncertain etiology.  2. Constipation.  3. Status post abdominal hysterectomy and bilateral salpingectomy on Nov 02, 2004.   4. Hypertension.  5. Gastroesophageal reflux disease.     PLAN:  The patient will be observed at the [facility] [place].  We will give her pain medicine and evaluate her upper abdominal pain.  We  will give her medicine for her gastroesophageal reflux disease.  We will  obtain ultrasound to rule out pelvic abscess formation and we will obtain x-  rays to rule out a bowel obstruction.      AVS/MEDQ  D:  11/14/2004  T:  11/14/2004  Job:  [id number]

## 2010-11-12 NOTE — Op Note (Signed)
[name], [name]               ACCOUNT NO.:  [id number]   MEDICAL RECORD NO.:  [id number]          PATIENT TYPE:  INP   LOCATION:  1611                         FACILITY:  [facility]   PHYSICIAN:  Sharlet Salina T. Hoxworth, M.D.DATE OF BIRTH:  10-04-1960   DATE OF PROCEDURE:  11/14/2004  DATE OF DISCHARGE:                                 OPERATIVE REPORT   PREOPERATIVE DIAGNOSIS:  Acute appendicitis.   POSTOPERATIVE DIAGNOSIS:  Acute appendicitis.   SURGICAL PROCEDURES:  Laparoscopic appendectomy.   SURGEON:  Lorne Skeens. Hoxworth, M.D.   ANESTHESIA:  General.   BRIEF HISTORY:  Teresa [name] is a 50 year old female who is approximately two  weeks status post hysterectomy through a Pfannenstiel incision.  She has  developed a progressive epigastric, right midabdominal and right lower  quadrant pain over the last several days and was admitted to gynecology for  evaluation.  A CT scan was obtained today, which shows definite evidence of  acute appendicitis.  Exam was consistent with this.  I recommended  proceeding with laparoscopic appendectomy.  The nature of the procedure, its  indications, the risks of bleeding, infection, and possible need for open  procedure were discussed and understood.  She is now brought to the  operating room for this procedure.   DESCRIPTION OF OPERATION:  The patient was brought to the operating room and  placed in the supine position on the operating table and general orotracheal  anesthesia was induced.  She was already on IV antibiotics.  The abdomen was  widely sterilely prepped and draped.  Local anesthesia was used to  infiltrate the trocar sites prior to the incisions.  The recent  infraumbilical laparoscopic incision was anesthetized, sharply opened, and  dissection carried down to the midline fascia.  A suture was removed and  with careful blunt dissection, this incision was opened into the free  peritoneal cavity.  Through a mattress suture of Vicryl,  he Hasson trocar  was placed and pneumoperitoneum established.  Now laparoscopy revealed  relatively few adhesions.  There were some chronic adhesions of the omentum  up over the edge of the right lobe of the liver.  No apparent acute  inflammatory process here.  Bowel loops were mildly dilated but certainly no  evidence of obstruction or inflammation.  The appendix was visualized and  did appear acutely inflamed with some exudate at the tip and adherent to the  right lateral pelvic sidewall.  Vaginal cuff was inspected and appeared  normal.  There was a small amount of bloody fluid in the cul-de-sac.  Ovaries were somewhat adherent down over the colon due to recent surgery but  appeared otherwise normal, and the sigmoid colon appeared normal.  The  gallbladder was not inflamed.  Liver appeared normal.  The appendix was grasped and elevated and the base  the appendix and mesoappendix exposed.  The mesoappendix was sequentially  divided with the Harmonic scalpel and appendix was completely freed down to  the base and the tip of the cecum.  The base was slightly thickened, but the  cecum appeared normal.  The Endo-GIA 45  mm blue load stapler was then used  to divide the appendix just below its base.  The staple line was intact and  without bleeding.  The appendix was placed in an EndoCatch bag and brought  out through the umbilicus.  The abdomen was then thoroughly irrigated and  completed and hemostasis assured.  Trocars were then  removed under direct vision and all CO2 evacuated.  The mattress suture was  secured to the umbilicus.  Skin incisions were closed with interrupted  subcuticular 4-0 Monocryl and Steri-Strips.  Sponge and needle counts were  correct. Dry sterile dressings were applied and the patient taken to  recovery in good condition.      BTH/MEDQ  D:  11/14/2004  T:  11/15/2004  Job:  [id number]

## 2010-11-12 NOTE — Discharge Summary (Signed)
[name], [name]               ACCOUNT NO.:  [id number]   MEDICAL RECORD NO.:  [id number]          PATIENT TYPE:  INP   LOCATION:  2013                         FACILITY:  [facility]   PHYSICIAN:  Fleet Contras, M.D.    DATE OF BIRTH:  1961-03-29   DATE OF ADMISSION:  09/21/2004  DATE OF DISCHARGE:  09/24/2004                                 DISCHARGE SUMMARY   Teresa [name] is a 50 year old African American lady with a history of recurrent  abdominal pain for several months.  She has been evaluated by a gynecologist  without significant findings.  She has been seen in the emergency room twice  with severe abdominal pain.  A CT scan performed as an outpatient suggested  the possibility of appendicitis.  She was thus admitted for that surgical  evaluation.  She was seen by Dr. Zachery Dakins who felt that the patient did not  have evidence of acute appendicitis.  The patient wanted to be discharged  home.  The patient could not be given a guarantee that appendectomy would  relieve all her symptoms.  Due to the persistent nature of the abdominal  pain, she was admitted now to the hospital for further evaluation.   HOSPITAL COURSE:  On admission, the patient was placed on a clear liquid  diet.  Further laboratory studies including CBC, C-MET, urinalysis, EKG, CT  scan of the abdomen and pelvis were performed.  A gynecologic consult was  requested.  The patient was seen by Dr. Pennie Rushing.  Acute appendicitis was  ruled.  She was given intravenous narcotic analgesic with adequate symptom  relief.  Her diet was slowly advanced.  An ultrasound scan of the pelvis was  performed to evaluate for possible ovarian cyst or masses.  She had some  hypokalemia episode in the hospital and this was replenished orally.  Ultrasound scan did show a left renal cyst but the gynecologist doubted this  as the cause of her symptomatology.  On September 14, 2004, her vital signs were  stable.  She has much less abdominal pain.   She was tolerating a full oral  intake without any nausea, vomiting, or diarrhea.  She was, therefore,  considered stable for discharge home.   DISCHARGE DIAGNOSES:  1.  Lower quadrant abdominal pain.  2.  Ovarian cyst.   DISCHARGE MEDICATIONS:  1.  HCTZ 25 mg every day.  2.  Ferrous sulfate 325 mg every day.  3.  Lotrel 5/20 one every day.  4.  Protonix 40 mg every day.  5.  Percocet 5/325 two q.6h. p.r.n. #30.   Follow up with Dr. Fleet Contras in one week.       EA/MEDQ  D:  12/07/2004  T:  12/07/2004  Job:  [id number]

## 2010-11-12 NOTE — Op Note (Signed)
NAME:  Teresa [name], Teresa [name]               ACCOUNT NO.:  [id number]   MEDICAL RECORD NO.:  [id number]          PATIENT TYPE:  AMB   LOCATION:  DSC                          FACILITY:  [facility]   PHYSICIAN:  Salley Scarlet., M.D.DATE OF BIRTH:  Feb 06, 1961   DATE OF PROCEDURE:  08/16/2006  DATE OF DISCHARGE:  08/14/2006                               OPERATIVE REPORT   PREOPERATIVE DIAGNOSIS:  Chalazion upper and lower lids, left eye.   POSTOPERATIVE DIAGNOSIS:  Chalazion upper and lower lids, left eye.   OPERATION:  Chalazion excision, upper and lower lid, left eye.   ANESTHESIA:  Local using Xylocaine 1%.   PROCEDURE:  The upper and lower lids of the left eye were inspected and  there were moderate size chalazia along the medial aspect of the upper  and lower lid of the left eye.  The patient was prepped and draped in  the usual manner.  Each lid, upper and lower lid was infiltrated with  several mL of Xylocaine 1%.  The chalazion clamp was applied over the  lesion which was located on the lower lid.  The lid was everted and a  cruciate incision made in the tarsus over the lesion.  The chalazion was  curetted using chalazion curette.  The sac was incised in toto using  sharp and blunt dissection.  A second chalazion clamp was applied over  the lesion on the upper lid.  The lid was everted and a cruciate  incision made in the tarsus over the lesion.  The lesion was curetted  using chalazion curette.  Each chalazion clamp was then removed and  after polysporin ophthalmic was applied to the eye.  A pressure patch  was applied.  The patient tolerated the procedure well and was  discharged to the recovery room in satisfactory condition.  She is  instructed to take Vicodin every four hours as needed for pain.  She is  instructed to remove patch tomorrow, to resume her drops four times a  day and to see me in the office in one week.   DISCHARGE DIAGNOSIS:  Chalazion upper and lower lid, left  eye.      Salley Scarlet., M.D.  Electronically Signed     TB/MEDQ  D:  08/16/2006  T:  08/17/2006  Job:  [id number]

## 2010-11-12 NOTE — H&P (Signed)
NAME:  Teresa [name], Marquisa               ACCOUNT NO.:  [id number]   MEDICAL RECORD NO.:  [id number]          PATIENT TYPE:  AMB   LOCATION:  SDC                           FACILITY:  [facility]   PHYSICIAN:  Hal Morales, M.D.DATE OF BIRTH:  [birth year]   DATE OF ADMISSION:  DATE OF DISCHARGE:                                HISTORY & PHYSICAL   HISTORY OF PRESENT ILLNESS:  Teresa [name] is a 50 year old single African-  American female with a history of hypertension, para 3-0-1-3, who presents  for hysterectomy because of severe dysmenorrhea and menometrorrhagia. For  the past 10 years, the patient has complained of heavy painful periods which  have worsened over the past 2 years. The patient's menses are characterized  by bleeding with clots every 2 weeks for 3 days in which she changes a  tampon every 30-90 minutes. She also experiences severe cramping which she  rates as a 10/10 on a 10-point scale with minimal relief from narcotic  analgesia and muscle relaxants. She denies any dyspareunia, vaginitis  symptoms, fever, or urinary tract symptoms. In March 2006 the patient  experienced severe right lower quadrant pelvic pain and was seen by her  family physician and by [facility] emergency department. The patient had a CT scan  done at that time which was indeterminate for appendicitis; however, it did  reveal a small cyst on the patient's left ovary. A follow-up pelvic  ultrasound at that same time showed a normal-appearing uterus and right  ovary along with a 3.2 cm x 2.7 cm x 2.3 cm hyperechoic nodule arising from  the left ovary. The characteristics of this nodule yielded a differential  diagnosis of hemorrhagic versus dermoid cyst; however, hemorrhagic cyst was  deemed to be most likely. There was no definitive diagnosis given for the  patient's right-sided pelvic pain and gonorrhea with chlamydia cultures done  in February 2006 had returned negative. Additionally, an endometrial biopsy  done in  February 2006 was benign. A review of management options were given  for the patient's presenting symptoms to include Lupron Depot, laparoscopy  with ablative procedure, and hysterectomy. In 2004 the patient had expressed  a desire for hysterectomy, but had not followed through with that decision.  Again, at this time, given the disruptive and chronic nature of her  symptoms, she wishes to proceed with definitive therapy.   PAST MEDICAL HISTORY:  1.  OB history:  Gravida 4 para 3-0-1-3. The patient had cesarean sections      in 1979, 1986, and 1990.  2.  GYN history: Menarche 50 years old. The patient uses bilateral tubal      ligation as her method of contraception. Her last menstrual period was      October 02, 2004. She denies any history of sexually-transmitted diseases      or abnormal Pap smear. The patient's last Pap smear was October 18, 2004      and it is pending at the time of this dictation.  3.  Medical history:  Positive for hypertension, hiatal hernia, degenerative      disc disease at  the level of L5-S1 with nerve root compression, ovarian      cyst, anemia, gastroesophageal reflux disease, migraines.  4.  Surgical history:  In 1986, D&C. In 1990, bilateral tubal ligation.      Denies any history of blood transfusions or problems with anesthesia.   FAMILY HISTORY:  Positive for cardiovascular disease, hypertension, diabetes  mellitus, stroke, thyroid disease, colon cancer, lung cancer, skin cancer,  and asthma.   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   HABITS:  She smokes one pack of cigarettes per day. She denies alcohol use.   CURRENT MEDICATIONS:  1.  Flexeril 10 mg one tablet as needed.  2.  Lotrel 5/10 one tablet daily.  3.  Hydrochlorothiazide 25 mg one tablet daily.   ALLERGIES:  1.  ASPIRIN and IBUPROFEN both cause gastrointestinal upset.  2.  LATEX causes a rash.   REVIEW OF SYSTEMS:  The patient does wear glasses. She has lower  back pain,  problems with constipation. Otherwise, review of systems are negative except  as is mentioned in the history of present illness.   PHYSICAL EXAMINATION:  VITAL SIGNS:  Blood pressure 124/80, weight is 168,  height is 5 feet 4.5 inches tall, temperature is 98.3 degrees Fahrenheit  orally.  NECK:  Supple. There are no masses, adenopathy, or thyromegaly.  HEART:  Regular rate and rhythm. There is no murmur.  LUNGS:  Clear to auscultation. There are no wheezes, rales, or rhonchi.  BACK:  No CVA tenderness.  ABDOMEN:  Bowel sounds are present. It is soft. The patient does have  diffuse lower quadrant tenderness with voluntary guarding. However, there is  no rebound or organomegaly.  EXTREMITIES:  Without clubbing, cyanosis, or edema.  PELVIC:  EG/BUS is within normal limits. The patient does have bilateral  inguinal tenderness; however, there is no adenopathy. The vagina is rugous.  Cervix is nontender without lesions. Uterus appears normal size, shape, and  consistency; however, there is tenderness over the fundal region. Adnexa  without any palpable masses; however, there is left adnexal tenderness and a  palpable mass. Rectovaginal exam without tenderness or masses.   IMPRESSION:  1.  Menometrorrhagia.  2.  Severe dysmenorrhea.   DISPOSITION:  As previously outlined, a discussion was held with the patient  regarding the options for management of her symptoms, and she has decided to  proceed  with definitive therapy. The patient verbalizes understanding of  the indications for her procedure, along with the risks associated with it  to include but not limited to reaction to anesthesia, damage to adjacent  organs, infection, and excessive bleeding. The patient was given a Fleet's  Phospho-Soda bowel prep to be administered 24 hours prior to her surgical  procedure, along with the [employer] brochure on understanding hysterectomy and  preparing for  surgery. The patient is consented to undergo a laparoscopic-assisted vaginal  hysterectomy with the possibility of a total abdominal hysterectomy and  possible bilateral salpingo-oophorectomy at [facility] [place]  on Nov 02, 2004 at 7:30 a.m.      EJP/MEDQ  D:  10/21/2004  T:  10/21/2004  Job:  [id number]

## 2010-11-12 NOTE — Consult Note (Signed)
NAMETORIANN, SPADONI               ACCOUNT NO.:  [id number]   MEDICAL RECORD NO.:  [id number]          PATIENT TYPE:  INP   LOCATION:  1611                         FACILITY:  [facility]   PHYSICIAN:  Sharlet Salina T. Hoxworth, M.D.DATE OF BIRTH:  1960/09/01   DATE OF CONSULTATION:  11/14/2004  DATE OF DISCHARGE:                                   CONSULTATION   CHIEF COMPLAINT:  Abdominal pain.   HISTORY OF PRESENT ILLNESS:  I was asked by Dr. Stefano Gaul to evaluate Ms.  Vines.  She is a 50 year old black female who is approximately two weeks  status post hysterectomy for dysfunctional uterine bleeding.  This procedure  was done through a Pfannenstiel incision without complication.  The patient  was discharged after two days doing well.  Her recovery initially seemed  uneventful, but she did have trouble with constipation requiring laxative  prescribed by her primary care physician.  Her abdomen felt kind of bloated  and tight because of this.  She was having some mild, crampy pain.  This  gradually began to worsen about five days ago when she began to have  intermittent worsening epigastric right mid abdominal and right lower  quadrant abdominal pain.  This was occasionally sharp or crampy.  It  gradually became more frequent and severe.  She was actually seen in [facility] triage two days ago and was apparently found to have a urinary  tract infection, and was started on p.o. Cipro at that time.  Last night,  however, the pain became much more intense and constant, again, located in  her epigastrium, right mid abdomen and right lower quadrant.  This became  associated with nausea and vomiting.  She presented back again to [facility] and was admitted late last night, early this morning by Dr.  Stefano Gaul.  She has been passing gas, but only had small loose bowel  movements in surgery with laxative.  She interestingly had an episode of  right lower quadrant pain in March of this year  and underwent evaluation in  the emergency room with a CT scan that showed no evidence of appendicitis  and no definite diagnosis was made.   She currently has no urinary symptoms.  No vomiting since last night.  No  melena or hematochezia.  No fever or chills.   PAST MEDICAL HISTORY:  Surgery includes C section x2 and hysterectomy  through Pfannenstiel incision as above.  Medically, she is followed for  hypertension and seasonal allergies.   MEDICATIONS:  1.  Lotrel 5/10 one daily.  2.  Hydrochlorothiazide 25 mg daily.  3.  Zyrtec one daily.  4.  Flonase nasal spray daily, and she has been on oral Cipro for two days      for presumed UTI.   ALLERGIES:  ASPIRIN (GI UPSET), LATEX CAUSES ITCHING AND RASH.   SOCIAL HISTORY:  Smokes cigarettes, does not drink alcohol.   FAMILY HISTORY:  Noncontributory.   REVIEW OF SYSTEMS:  GENERAL:  No fevers, chills, weight change.  RESPIRATORY:  No shortness of breath, cough, wheezing.  HEENT:  No vision,  hearing or swallowing problems.  CARDIAC:  No history of chest pain or  palpitations.  ABDOMEN:  GI as above.  GU:  Denies urinary burning  frequency.  HEMATOLOGIC:  No history of blood clots or excessive bleeding.   PHYSICAL EXAMINATION:  VITAL SIGNS:  She is afebrile and vital signs are all  within normal limits.  GENERAL APPEARANCE:  Well-developed black female in no distress.  SKIN:  Warm and dry without rash or infection.  HEENT:  No palpable mass or thyromegaly.  Sclerae nonicteric.  LUNGS:  Clear to auscultation without wheezes, increased work or breathing.  CARDIAC:  Regular rate and rhythm without murmurs.  No edema.  No JVD.  ABDOMEN:  There is a well-healed low midline incision and a healing  Pfannenstiel incision with no evidence of infection or hematoma.  Bowel  sounds are present but hypoactive.  There is tenderness in the epigastrium,  right mid abdomen and right lower quadrant, but greatest in the right lower  quadrant with  guarding in this area.  No palpable masses or  hepatosplenomegaly.  PELVIC/RECTAL:  No repeated.  EXTREMITIES:  No joint swelling or deformity.  NEUROLOGICAL:  Alert and oriented.  Gait normal.   LABORATORY AND X-RAY:  CBC, electrolytes, urinalysis all unremarkable.   I reviewed the CT scan of the abdomen and pelvis which was obtained today.  This shows clear evidence of appendicitis with a thickened, dilated appendix  and small amount of periappendiceal fluid.  No other abnormalities seen  except a stable hemangioma of the liver.   ASSESSMENT/PLAN:  Apparent acute appendicitis which is developed in the  early postoperative period following hysterectomy.  I see no evidence for  any postoperative complication that could be causing this CT finding.  I  have recommended proceeding with laparoscopic appendectomy, and the patient  is to be transferred to [facility] for this procedure.      BTH/MEDQ  D:  11/14/2004  T:  11/15/2004  Job:  [id number]   cc:   Janine Limbo, M.D.  [address], [address]  [place]  [place] [address]  Fax: [phone]   Hal Morales, M.D.  Fax: [phone]

## 2010-11-12 NOTE — H&P (Signed)
Teresa [name], Teresa [name]               ACCOUNT NO.:  [id number]   MEDICAL RECORD NO.:  [id number]          PATIENT TYPE:  INP   LOCATION:  2013                         FACILITY:  [facility]   PHYSICIAN:  Fleet Contras, M.D.    DATE OF BIRTH:  13-Mar-1961   DATE OF ADMISSION:  09/21/2004  DATE OF DISCHARGE:                                HISTORY & PHYSICAL   PRESENTING COMPLAINT:  Abdominal pain.   HISTORY OF PRESENTING COMPLAINT:  [name] is a 50 year old,  African-American lady who has had recurrent abdominal pain for several  months and has been evaluated by a gynecologist as well as two recent  emergency room visits in the last week with severe abdominal pain.  She  stated that the pain started in the epigastrium and the periumbilical region  but has centered in the lower abdomen, specifically in the left lower  quadrant and right lower quadrant in the last few days.  This is associated  with nausea.  The patient denies any vomiting.  She initially had some  diarrhea, but she is more constipated now.  She has had no fevers but has  some chills and sweating episodes.  She has been unable to eat or keep any  food in her stomach for almost 48 hours.  She was further evaluated by me 48  hours ago and was thought to have developed acute appendicitis.  She  attended [facility] and had a CT scan of her abdomen and pelvis.  The  report on this is attached, and according to the reading by Dr. Cristal Deer  Pain, the mid-appendix was dilated to about 11 mm in diameter, probably  associated with mild circumferential Ishibashi thickening and trace  periappendicular inflammation, likely reflecting appendicitis.  The scan  also demonstrated two small liver lesions, maybe hemangiomas, as well as a  2.5 cm nonenhancing left adnexal mass, likely reflecting hemorrhagic left  ovarian cyst.  The patient was sent to the emergency room to be evaluated by  a surgeon, Dr. Zachery Dakins, who kindly agreed to see  the patient.  He  evaluated the patient, and although he was not entirely convinced, based on  the patient's physical examination, of the CT scan findings, he was willing  to proceed with appendectomy to rule this out as the cause of her chronic  recurrent abdominal pain.  However, according to his report, the patient  declined proceeding with the surgery unless she could be guaranteed that her  pain would completely resolve.  Dr. Zachery Dakins obviously could not give her  this guarantee, and the patient chose to go home and follow up with him in  his office the next day.  I was called by the nurse practitioner working  with her gynecologist, Dr. Osborn Coho, today, saying that the patient  was in their facility with the same complaints and wanted some gynecological  procedure performed.  After a long discussion with her, I agreed to take  over her care and admit her to the hospital for further evaluation and  treatment.   PAST MEDICAL HISTORY:  1.  Hypertension.  2.  History of herniated lumbar disc.  3.  History of uterine fibroids.  4.  History of tobacco use disorder.  5.  History of anemia.  6.  History of recurrent headaches.   MEDICATION HISTORY:  1.  Hydrochlorothiazide 25 mg one p.o. daily.  2.  Flonase 1 spray each nostril daily.  3.  Ferrous sulfate 325 mg one p.o. daily.  4.  Zyrtec 10 mg one daily.  5.  Lotrel 5/10 one p.o. daily.  6.  Ambien 10 mg one p.o. at bedtime p.r.n.   PAST SURGICAL HISTORY:  1.  C-section x 3.  2.  Tubal ligation in 1990.   FAMILY AND SOCIAL HISTORY:  She resides with her family.  She has three  children in good health.  She is a Social worker by trade.  Her father is  deceased from colon cancer.  Her mother is alive with hypertension.  She  smokes about one pack of cigarettes per two days.  She drinks alcohol  occasionally.  She does not use any illicit drugs.   REVIEW OF SYSTEMS:  GENERAL:  She denies any fevers.  She has no skin  rash.  RESPIRATORY:  She has no cough, sputum production, hemoptysis, or wheezing.  CARDIAC:  She denies any chest pain, dyspnea, palpitations, orthopnea, or  PND.  GI:  As above.  URINARY:  She denies any dysuria, frequency, or  hematuria.  MUSCULOSKELETAL:  She denies any joint pains, joint swelling, or  joint stiffness.  NEUROLOGIC:  She denies any syncope, dizziness, focal  weakness, or paresthesias.   PHYSICAL EXAMINATION:  GENERAL:  She is well nourished, but she is  uncomfortable, acutely ill looking.  She is in moderate pain.  She is not  dyspneic.  She is slightly dehydrated, not pale, not febrile.  VITAL SIGNS:  Blood pressure 150/90; heart rate 76, regular; respiratory  rate 18; temperature 99 degrees Fahrenheit; weight 168; she is 5 feet 5  inches tall.  HEENT:  She is normocephalic and atraumatic.  NECK:  Supple, with no elevated JVD.  Carotid pulses are present  bilaterally, with no bruit.  CHEST:  Shows good air entry bilaterally, with no adventitious sounds.  HEART:  Sounds 1 and 2 are heard, with no murmurs.  ABDOMEN:  Obese, with a suprapubic scar.  She is diffusely tender in the  abdomen, mostly in the right lower quadrant and left lower quadrant, where  there is guarding and rebound.  Her bowel sounds are present.  There are no  palpable masses.  EXTREMITIES:  Show no edema, no calf tenderness, no finger clubbing.  CNS:  Alert, oriented x 3, with no focal neurological deficits.  PULSES:  Her peripheral pulses are present bilaterally.   LABORATORY STUDIES:  Labs performed at the emergency room yesterday showed  WBC 6.3, hemoglobin 12.1, hematocrit 37, platelet count 326.  Sodium 134,  potassium 3.2, chloride 101, bicarbonate 27, glucose 90, BUN 4, creatinine  0.8, bilirubin 0.8, alkaline phosphatase 70, AST 16, ALT 20, albumin 3.8.  Her urinalysis was negative.  Urine drug screen was negative.  Her pregnancy test was negative.  Amylase and lipase that were performed  on September 18, 2004  at the emergency room were both normal.  CT scan of the abdomen and pelvis  performed at [facility] on September 20, 2004 was reported above.   ASSESSMENT:  [name] is a 50 year old, African-American lady with  acute on chronic abdominal pain.  She is  being admitted for further  evaluation, pain control, and appropriate consultation and therapy.   ADMITTING DIAGNOSIS:  Abdominal pain, rule out acute appendicitis.   PLAN OF CARE:  Admit to medical bed.  To be placed on clear liquid diet.  Vital signs q.4 hours.  Further laboratory studies including a CBC, CMET,  urinalysis, EKG, CT scan of the abdomen and pelvis will be performed.  She  will be continued on her home medications.  She will also receive IV  Dilaudid 2-4 mg q.4 h. p.r.n. for pain; IV  Protonix 40 mg daily; and IV Phenergan 12.5 mg q.6 h.; IV D5-half normal  saline with 20 mEq of potassium at 100 cc/hour.  This plan of care has been  discussed with her and her mother, and further treatment plan will be  dependent on results of above investigations.      EA/MEDQ  D:  09/22/2004  T:  09/22/2004  Job:  [id number]

## 2010-11-12 NOTE — Consult Note (Signed)
Teresa [name], Teresa [name]               ACCOUNT NO.:  [id number]   MEDICAL RECORD NO.:  [id number]          PATIENT TYPE:  INP   LOCATION:  2013                         FACILITY:  [facility]   PHYSICIAN:  Cherylynn Ridges, M.D.    DATE OF BIRTH:  06/08/1961   DATE OF CONSULTATION:  09/22/2004  DATE OF DISCHARGE:                                   CONSULTATION   Thank you for asking [place] to see this 50 year old female who had previously  been evaluated over the weekend with abdominal pain.  She had had an outside  CT scan done which demonstrated questionable inflammation in the right lower  quadrant but not definitive enough for Dr. Zachery Dakins who had seen the  patient to recommend a definite appendectomy.  The patient, however, because  of that equivocation decided to go home but came back in today with  abdominal pain.  She had a repeat CT scan which shows no appendicitis but an  ovarian cyst.  She has had a previous history of fibroid tumors and ovarian  cyst and is planned for hysterectomy in the near future but it is not on the  schedule.   PAST MEDICAL HISTORY:  1.  Hypertension.  2.  History of uterine and ovarian tumors and cyst.   PAST SURGICAL HISTORY:  Previous C-section.   ALLERGIES:  SHE HAS AN INTOLERANCE TO ASPIRIN.   MEDICATIONS:  1.  Norvasc 5 mg a day.  2.  Lotensin 10 mg a day.  3.  Iron daily.  4.  Hydrochlorothiazide 25 mg a day.  5.  Claritin 10 mg a day.  6.  Protonix 40 mg IV now and Dilaudid p.r.n. for pain in the hospital.   SOCIAL HISTORY:  Unremarkable, she lives in [place], works at [employer].  Stephania Fragmin, she is a one pack a day smoker, she does not drink alcohol and she  is no restricted diet.   FAMILY HISTORY:  Noncontributory for this current process.   REVIEW OF SYSTEMS:  She has had some fevers and chills, and some possible  night sweats.  Her last menstrual period was on September 02, 2004.  She has had  nausea, vomiting and diarrhea.   PHYSICAL EXAMINATION:   VITAL SIGNS:  Currently, she is afebrile, pulse 66,  blood pressure 124/79, O2 saturations are 97% on room air.  GENERAL:  She does not appear to be in significant distress but she has just  received Dilaudid for pain.  HEENT:  She is normocephalic, atraumatic and anicteric.  NECK:  Supple, there are no bruits, no palpable adenopathy.  CHEST:  Clear to auscultation and she has symmetrical excursion bilaterally.  CARDIAC:  Regular rate and rhythm, no murmurs, rubs, gallops or heaves.  ABDOMEN:  Soft and mildly tender in the lower portion but no very focal  points of tenderness.  She has normoactive bowel sounds.  RECTAL:  Not performed.  EXTREMITIES:  She has no clubbing, cyanosis or edema.  She has palpable  pulses that are symmetrical bilaterally.   Reviewing laboratory studies, her white count is 6.2 without a left shift,  her hemoglobin is 12.  Normal liver function tests and her electrolytes are  normal with the exception of a potassium of 3.1.   IMPRESSION:  Lower abdominal pain in the pelvic area both right and left  side, possibly and probably attributing both to a gynecologic pathology.  She does have an ovarian cyst and a history of fibroid tumors by history.  She also has a history of hypertension.  With her negative computerized  tomographic scan for acute appendicitis recently and normal white count  without a left shift and no focal tenderness in the right lower quadrant, it  is very unlikely she has a general surgical process.  However, her cyst and  tumors may be causing her lower abdominal pain and a gynecologic  consultation would be recommended.  There is no need for general surgery  involvement further today.      JOW/MEDQ  D:  09/22/2004  T:  09/22/2004  Job:  [id number]

## 2011-04-05 LAB — POCT CARDIAC MARKERS
CKMB, poc: <1 — ABNORMAL LOW
CKMB, poc: <1 — ABNORMAL LOW
Myoglobin, poc: 39.6
Myoglobin, poc: 52.1
Operator id: 4533
Operator id: 4661
Troponin i, poc: <0.05
Troponin i, poc: <0.05

## 2011-04-05 LAB — COMPREHENSIVE METABOLIC PANEL
ALT: 21
AST: 19
Albumin: 3.7
Alkaline Phosphatase: 65
BUN: 12
CO2: 28
Calcium: 8.9
Chloride: 102
Creatinine, Ser: 0.83
GFR calc Af Amer: >60
GFR calc non Af Amer: >60
Glucose, Bld: 98
Potassium: 2.9 — ABNORMAL LOW
Sodium: 140
Total Bilirubin: 0.7
Total Protein: 6.6

## 2011-04-05 LAB — CBC
HCT: 40.1
Hemoglobin: 13.8
MCHC: 34.3
MCV: 82.4
Platelets: 288
RBC: 4.87
RDW: 13.4
WBC: 13.5 — ABNORMAL HIGH

## 2011-04-05 LAB — D-DIMER, QUANTITATIVE: D-Dimer, Quant: <0.22

## 2011-05-04 ENCOUNTER — Encounter: Payer: Self-pay | Admitting: *Deleted

## 2011-05-04 ENCOUNTER — Emergency Department (HOSPITAL_COMMUNITY): Payer: Self-pay

## 2011-05-04 ENCOUNTER — Other Ambulatory Visit: Payer: Self-pay

## 2011-05-04 ENCOUNTER — Emergency Department (HOSPITAL_COMMUNITY)
Admission: EM | Admit: 2011-05-04 | Discharge: 2011-05-05 | Disposition: A | Discharge status: Home / Self Care | Payer: Self-pay | Attending: Emergency Medicine | Admitting: Emergency Medicine

## 2011-05-04 DIAGNOSIS — R0789 Other chest pain: Secondary | ICD-10-CM | POA: Insufficient documentation

## 2011-05-04 DIAGNOSIS — R202 Paresthesia of skin: Secondary | ICD-10-CM

## 2011-05-04 DIAGNOSIS — I10 Essential (primary) hypertension: Secondary | ICD-10-CM | POA: Insufficient documentation

## 2011-05-04 DIAGNOSIS — R209 Unspecified disturbances of skin sensation: Secondary | ICD-10-CM | POA: Insufficient documentation

## 2011-05-04 DIAGNOSIS — G8929 Other chronic pain: Secondary | ICD-10-CM | POA: Insufficient documentation

## 2011-05-04 DIAGNOSIS — M542 Cervicalgia: Secondary | ICD-10-CM | POA: Insufficient documentation

## 2011-05-04 DIAGNOSIS — R42 Dizziness and giddiness: Secondary | ICD-10-CM | POA: Insufficient documentation

## 2011-05-04 DIAGNOSIS — F17200 Nicotine dependence, unspecified, uncomplicated: Secondary | ICD-10-CM | POA: Insufficient documentation

## 2011-05-04 HISTORY — DX: Diaphragmatic hernia without obstruction or gangrene: K44.9

## 2011-05-04 HISTORY — DX: Migraine, unspecified, not intractable, without status migrainosus: G43.909

## 2011-05-04 HISTORY — DX: Essential (primary) hypertension: I10

## 2011-05-04 HISTORY — DX: Anemia, unspecified: D64.9

## 2011-05-04 LAB — URINALYSIS, ROUTINE W REFLEX MICROSCOPIC
Bilirubin Urine: NEGATIVE
Glucose, UA: NEGATIVE mg/dL
Ketones, ur: NEGATIVE mg/dL
Leukocytes, UA: NEGATIVE
Nitrite: NEGATIVE
Protein, ur: NEGATIVE mg/dL
Specific Gravity, Urine: 1.01 (ref 1.005–1.030)
Urobilinogen, UA: 0.2 mg/dL (ref 0.0–1.0)
pH: 8 (ref 5.0–8.0)

## 2011-05-04 LAB — BASIC METABOLIC PANEL
BUN: 12 mg/dL (ref 6–23)
CO2: 27 mEq/L (ref 19–32)
Calcium: 10 mg/dL (ref 8.4–10.5)
Chloride: 100 mEq/L (ref 96–112)
Creatinine, Ser: 0.79 mg/dL (ref 0.50–1.10)
GFR calc Af Amer: >90 mL/min (ref >90)
GFR calc non Af Amer: >90 mL/min (ref >90)
Glucose, Bld: 88 mg/dL (ref 70–99)
Potassium: 3.4 mEq/L — ABNORMAL LOW (ref 3.5–5.1)
Sodium: 138 mEq/L (ref 135–145)

## 2011-05-04 LAB — DIFFERENTIAL
Basophils Absolute: 0.1 10*3/uL (ref 0.0–0.1)
Basophils Relative: 1 % (ref 0–1)
Eosinophils Absolute: 0.2 10*3/uL (ref 0.0–0.7)
Eosinophils Relative: 2 % (ref 0–5)
Lymphocytes Relative: 36 % (ref 12–46)
Lymphs Abs: 3.3 10*3/uL (ref 0.7–4.0)
Monocytes Absolute: 0.4 10*3/uL (ref 0.1–1.0)
Monocytes Relative: 4 % (ref 3–12)
Neutro Abs: 5.2 10*3/uL (ref 1.7–7.7)
Neutrophils Relative %: 57 % (ref 43–77)

## 2011-05-04 LAB — CBC
HCT: 41.2 % (ref 36.0–46.0)
Hemoglobin: 14.2 g/dL (ref 12.0–15.0)
MCH: 28.2 pg (ref 26.0–34.0)
MCHC: 34.5 g/dL (ref 30.0–36.0)
MCV: 81.9 fL (ref 78.0–100.0)
Platelets: 276 10*3/uL (ref 150–400)
RBC: 5.03 MIL/uL (ref 3.87–5.11)
RDW: 13.8 % (ref 11.5–15.5)
WBC: 9.1 10*3/uL (ref 4.0–10.5)

## 2011-05-04 LAB — URINE MICROSCOPIC-ADD ON

## 2011-05-04 LAB — POCT PREGNANCY, URINE: Preg Test, Ur: NEGATIVE

## 2011-05-04 LAB — POCT I-STAT TROPONIN I: Troponin i, poc: 0 ng/mL (ref 0.00–0.08)

## 2011-05-04 MED ORDER — MORPHINE SULFATE 4 MG/ML IJ SOLN
4.0000 mg | INTRAMUSCULAR | Status: DC | PRN
  Administered 2011-05-04: 4 mg via INTRAVENOUS
  Filled 2011-05-04: qty 1

## 2011-05-04 MED ORDER — HYDROMORPHONE HCL PF 1 MG/ML IJ SOLN
1.0000 mg | Freq: Once | INTRAMUSCULAR | Status: AC
  Administered 2011-05-04: 1 mg via INTRAVENOUS
  Filled 2011-05-04: qty 1

## 2011-05-04 MED ORDER — SODIUM CHLORIDE 0.9 % IV SOLN
INTRAVENOUS | Status: DC
  Administered 2011-05-04: 21:00:00 via INTRAVENOUS

## 2011-05-04 NOTE — ED Provider Notes (Signed)
History     CSN: [id number] Arrival date & time: 05/04/2011  8:03 PM     Chief Complaint  Patient presents with  . Chest Pain     HPI Pt was seen at 2035.  Per pt, c/o gradual onset and persistence of constant left lower ribs "pain" x4 days.  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  Also c/o gradual onset and persistence of constant left side of neck "pain" as well as left hand "tingling" x3-4 months.  Pt describes the tingling as starting in her left hand and radiating up into her left forearm.  States she has been out of her anti-HTN meds for "at least " 6 months or longer.  States she "owes her doctor money" and hasn't followed up in the office.  Denies shoulder pain.  No palpitations, no cough/SOB, no fevers, no rash, no abd pain, no N/V/D, no visual changes, no focal motor weakness.    Past Medical History  Diagnosis Date  . Hypertension   . Hiatal hernia   . Chronic back pain   . Anemia   . Migraine headache     Past Surgical History  Procedure Date  . Abdominal hysterectomy      History  Substance Use Topics  . Smoking status: Current Everyday Smoker  . Smokeless tobacco: Not on file  . Alcohol Use: No    Review of Systems ROS: Statement: All systems negative except as marked or noted in the HPI; Constitutional: Negative for fever and chills. ; ; Eyes: Negative for eye pain, redness and discharge. ; ; ENMT: Negative for ear pain, hoarseness, nasal congestion, sinus pressure and sore throat. ; ; Cardiovascular: +chest pain.  Negative for palpitations, diaphoresis, dyspnea and peripheral edema. ; ; Respiratory: Negative for cough, wheezing and stridor. ; ; Gastrointestinal: Negative for nausea, vomiting, diarrhea and abdominal pain, blood in stool, hematemesis, jaundice and rectal bleeding. . ; ; Genitourinary: Negative for dysuria, flank pain and hematuria. ; ; Musculoskeletal: Negative for back pain  and +left neck pain. Negative for swelling and trauma.; ; Skin: Negative for pruritus, rash, abrasions, blisters, bruising and skin lesion.; ; Neuro: Negative for headache, lightheadedness and neck stiffness. Negative for weakness, altered level of consciousness , altered mental status, extremity weakness, involuntary movement, seizure and syncope. +paresthesias left hand.   Allergies  Aspirin  Home Medications   Current Outpatient Rx  Name Route Sig Dispense Refill  . CALCIUM-MAGNESIUM-ZINC 500-250-12.5 MG PO TABS Oral Take 1 tablet by mouth 2 (two) times daily.      Dewayne Shorter SEED OIL Oral Take 1 tablet by mouth 3 (three) times daily.      Marland Kitchen VITAMIN D 1000 UNITS PO TABS Oral Take 1,000 Units by mouth daily.      . CYANOCOBALAMIN 1000 MCG PO TABS Oral Take 200 mcg by mouth daily.        BP 215/117  Pulse 84  Temp(Src) 98.8 F (37.1 C) (Oral)  Resp 20  SpO2 98% BP 189/108  Pulse 72  Temp(Src) 98.1 F (36.7 C) (Oral)  Resp 20  SpO2 96%   Physical Exam 2040: Physical examination:  Nursing notes reviewed; Vital signs and O2 SAT reviewed;  Constitutional: Well developed, Well nourished, Well hydrated, In no acute distress; Head:  Normocephalic, atraumatic; Eyes: EOMI, PERRL, No scleral icterus; ENMT: Mouth and pharynx normal, Mucous membranes moist; Neck: Supple, Full range of motion, No lymphadenopathy; Cardiovascular: Regular  rate and rhythm, No murmur, rub, or gallop; Respiratory: Breath sounds clear & equal bilaterally, No rales, rhonchi, wheezes, or rub, Normal respiratory effort/excursion; Chest: +left lower anterior-lateral ribs and left latissimus muscle tender to light touch. Movement normal; Abdomen: Soft, Nontender, Nondistended, Normal bowel sounds; Extremities: Pulses normal, No tenderness, No edema, No calf edema or asymmetry.  Spine:  No midline CS, TS, LS tenderness. +TTP hypertonic left trapezius muscle.; Neuro: AA&Ox3, Major CN grossly intact. Strength 5/5 equal bilat UE's  and LE's.  DTR 2/4 equal bilat UE's and LE's. Normal cerebellar testing bilat UE's and LE's.  Speech clear.  No facial droop.  No nystagmus.   Motor strength at left shoulder normal.  Sensation intact over left deltoid region.  Left hand has intact strength in the distribution of the median, radial, and ulnar nerve function with subjective decreased sensation left hand; otherwise no gross focal sensory deficits.  Strong radial pulse left wrist.  +FROM left elbow with intact motor strength biceps and triceps muscles to resistance.  LUE muscle compartments soft and NT to palp..; Skin: Color normal, Warm, Dry, no rash.    ED Course  Procedures  2040:  Upon my arrival into exam room, pt was talking on the phone, holding it in her left hand, NAD.  After I entered, pt closed the phone and started to loudly complain CP.  Pt has been eval in ED previously for RIGHT UE "numbness and tingling" with RIGHT UE subjective sensory deficits at that time (09/2010), as well as left forearm tingling/numbness (03/2009) .  Pt asking me after my exam "should I call for a ride since you'll be giving me pain meds?"  Will continue workup.  MDM  MDM Reviewed: nursing note and vitals Reviewed previous: ECG Interpretation: labs, ECG, x-ray and CT scan    Date: 05/04/2011  Rate: 88  Rhythm: normal sinus rhythm  QRS Axis: normal  Intervals: normal  ST/T Wave abnormalities: nonspecific ST/T changes, flipped T-waves ant-lat and inferior leads  Conduction Disutrbances:none  Narrative Interpretation:   Old EKG Reviewed: unchanged, no significant changes from previous EKG dated 10/10/2010.  Results for orders placed during the hospital encounter of 05/04/11  BASIC METABOLIC PANEL      Component Value Range   Sodium 138  135 - 145 (mEq/L)   Potassium 3.4 (*) 3.5 - 5.1 (mEq/L)   Chloride 100  96 - 112 (mEq/L)   CO2 27  19 - 32 (mEq/L)   Glucose, Bld 88  70 - 99 (mg/dL)   BUN 12  6 - 23 (mg/dL)   Creatinine, Ser 6.21   0.50 - 1.10 (mg/dL)   Calcium 30.8  8.4 - 10.5 (mg/dL)   GFR calc non Af Amer >90  >90 (mL/min)   GFR calc Af Amer >90  >90 (mL/min)  CBC      Component Value Range   WBC 9.1  4.0 - 10.5 (K/uL)   RBC 5.03  3.87 - 5.11 (MIL/uL)   Hemoglobin 14.2  12.0 - 15.0 (g/dL)   HCT 65.7  84.6 - 96.2 (%)   MCV 81.9  78.0 - 100.0 (fL)   MCH 28.2  26.0 - 34.0 (pg)   MCHC 34.5  30.0 - 36.0 (g/dL)   RDW 95.2  84.1 - 32.4 (%)   Platelets 276  150 - 400 (K/uL)  DIFFERENTIAL      Component Value Range   Neutrophils Relative 57  43 - 77 (%)   Neutro Abs 5.2  1.7 -  7.7 (K/uL)   Lymphocytes Relative 36  12 - 46 (%)   Lymphs Abs 3.3  0.7 - 4.0 (K/uL)   Monocytes Relative 4  3 - 12 (%)   Monocytes Absolute 0.4  0.1 - 1.0 (K/uL)   Eosinophils Relative 2  0 - 5 (%)   Eosinophils Absolute 0.2  0.0 - 0.7 (K/uL)   Basophils Relative 1  0 - 1 (%)   Basophils Absolute 0.1  0.0 - 0.1 (K/uL)  URINALYSIS, ROUTINE W REFLEX MICROSCOPIC      Component Value Range   Color, Urine YELLOW  YELLOW    Appearance CLOUDY (*) CLEAR    Specific Gravity, Urine 1.010  1.005 - 1.030    pH 8.0  5.0 - 8.0    Glucose, UA NEGATIVE  NEGATIVE (mg/dL)   Hgb urine dipstick SMALL (*) NEGATIVE    Bilirubin Urine NEGATIVE  NEGATIVE    Ketones, ur NEGATIVE  NEGATIVE (mg/dL)   Protein, ur NEGATIVE  NEGATIVE (mg/dL)   Urobilinogen, UA 0.2  0.0 - 1.0 (mg/dL)   Nitrite NEGATIVE  NEGATIVE    Leukocytes, UA NEGATIVE  NEGATIVE   POCT PREGNANCY, URINE      Component Value Range   Preg Test, Ur NEGATIVE    POCT I-STAT TROPONIN I      Component Value Range   Troponin i, poc 0.00  0.00 - 0.08 (ng/mL)   Comment 3           URINE MICROSCOPIC-ADD ON      Component Value Range   Squamous Epithelial / LPF FEW (*) RARE    WBC, UA 0-2  <3 (WBC/hpf)   RBC / HPF 3-6  <3 (RBC/hpf)   Bacteria, UA RARE  RARE    Dg Chest 2 View  05/04/2011  *RADIOLOGY REPORT*  Clinical Data: Chest pain and weakness.  CHEST - 2 VIEW  Comparison: None  Findings:  The cardiac silhouette, mediastinal and hilar contours are stable.  The lungs are clear.  No pleural effusion.  The bony thorax is intact.  IMPRESSION: No acute cardiopulmonary findings.  Original Report Authenticated By: P. Loralie Champagne, M.D.   Ct Head Wo Contrast  05/04/2011  *RADIOLOGY REPORT*  Clinical Data:  Dizziness.  Left arm numbness.  CT HEAD WITHOUT CONTRAST CT CERVICAL SPINE WITHOUT CONTRAST  Technique:  Multidetector CT imaging of the head and cervical spine was performed following the standard protocol without intravenous contrast.  Multiplanar CT image reconstructions of the cervical spine were also generated.  Comparison:  Head CT 02/10/2010.  CT HEAD  Findings: The ventricles are normal.  No extra-axial fluid collections are seen.  The brainstem and cerebellum are unremarkable.  No acute intracranial findings such as infarction or hemorrhage.  No mass lesions.  The bony calvarium is intact.  The visualized paranasal sinuses and mastoid air cells are clear.  IMPRESSION: No acute intracranial findings or mass lesions.  No change since prior study.  CT CERVICAL SPINE  Findings: The sagittal reformatted images demonstrate normal alignment of the cervical vertebral bodies.  Disc spaces and vertebral bodies are maintained.  No acute bony findings or abnormal prevertebral soft tissue swelling.  The facets are normally aligned.  No facet or laminar fractures are seen. No large disc protrusions.  The neural foramen are patent.  The skull base C1 and C1-C2 articulations are maintained.  The dens is normal.  There are scattered cervical lymph nodes.  The lung apices are clear.  IMPRESSION: Normal alignment  and no acute bony findings.  Original Report Authenticated By: P. Loralie Champagne, M.D.   Ct Cervical Spine Wo Contrast  05/04/2011  *RADIOLOGY REPORT*  Clinical Data:  Dizziness.  Left arm numbness.  CT HEAD WITHOUT CONTRAST CT CERVICAL SPINE WITHOUT CONTRAST  Technique:  Multidetector CT imaging of  the head and cervical spine was performed following the standard protocol without intravenous contrast.  Multiplanar CT image reconstructions of the cervical spine were also generated.  Comparison:  Head CT 02/10/2010.  CT HEAD  Findings: The ventricles are normal.  No extra-axial fluid collections are seen.  The brainstem and cerebellum are unremarkable.  No acute intracranial findings such as infarction or hemorrhage.  No mass lesions.  The bony calvarium is intact.  The visualized paranasal sinuses and mastoid air cells are clear.  IMPRESSION: No acute intracranial findings or mass lesions.  No change since prior study.  CT CERVICAL SPINE  Findings: The sagittal reformatted images demonstrate normal alignment of the cervical vertebral bodies.  Disc spaces and vertebral bodies are maintained.  No acute bony findings or abnormal prevertebral soft tissue swelling.  The facets are normally aligned.  No facet or laminar fractures are seen. No large disc protrusions.  The neural foramen are patent.  The skull base C1 and C1-C2 articulations are maintained.  The dens is normal.  There are scattered cervical lymph nodes.  The lung apices are clear.  IMPRESSION: Normal alignment and no acute bony findings.  Original Report Authenticated By: P. Loralie Champagne, M.D.    1:24 AM:  Pt is sound asleep on stretcher with her significant other laying next to her with her left arm wrapped around/under them.  NAD, resps easy.  When she is woken up for re-eval, she starts yelling that she "is in so much pain."  Doubt ACS as cause for her CP today, given she has had 4 days of constant pain with unchanged EKG from previous and normal troponin.  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  Stressed importance of taking her BP meds and f/u with her PMD for same.   States she "thinks she had a problem with" clonidine, but has taken HCTZ previously without any issues.  Will rx today. Family wants to go home now, pt also wants to go home.  Dx testing d/w pt and family.  Questions answered.  Verb understanding, agreeable to d/c home with outpt f/u.   New Prescriptions   HYDROCHLOROTHIAZIDE (HYDRODIURIL) 25 MG TABLET    Take 1 tablet (25 mg total) by mouth daily.   METHOCARBAMOL (ROBAXIN) 500 MG TABLET    Take 2 tablets (1,000 mg total) by mouth 4 (four) times daily as needed (muscle spasm/pain).   OXYCODONE-ACETAMINOPHEN (PERCOCET) 5-325 MG PER TABLET    1 or 2 tabs PO q6h prn pain        Shatori Bertucci Allison Quarry, DO 05/05/11 1421

## 2011-05-04 NOTE — ED Notes (Signed)
Pt in c/o left sided chest pain x4 days with left arm numbness today, chest is tender to palpation, pt states pain is intermittent and increased when laying down, pt states today also noted lightheadedness and also shortness of breath

## 2011-05-05 LAB — URINE CULTURE
Colony Count: 8000
Culture  Setup Time: 201211080132

## 2011-05-05 MED ORDER — METHOCARBAMOL 500 MG PO TABS: 1000.0000 mg | ORAL_TABLET | Freq: Four times a day (QID) | ORAL | Status: AC | PRN

## 2011-05-05 MED ORDER — HYDROCHLOROTHIAZIDE 25 MG PO TABS: 25.0000 mg | ORAL_TABLET | Freq: Every day | ORAL | Status: DC

## 2011-05-05 MED ORDER — OXYCODONE-ACETAMINOPHEN 5-325 MG PO TABS: ORAL_TABLET | ORAL | Status: AC

## 2011-05-05 NOTE — ED Notes (Signed)
MD at bedside. 

## 2011-07-13 ENCOUNTER — Encounter (HOSPITAL_COMMUNITY): Payer: Self-pay | Admitting: Cardiology

## 2011-07-13 ENCOUNTER — Emergency Department (INDEPENDENT_AMBULATORY_CARE_PROVIDER_SITE_OTHER)
Admission: EM | Admit: 2011-07-13 | Discharge: 2011-07-13 | Disposition: A | Discharge status: Home / Self Care | Payer: Self-pay | Source: Home / Self Care

## 2011-07-13 DIAGNOSIS — B9789 Other viral agents as the cause of diseases classified elsewhere: Secondary | ICD-10-CM

## 2011-07-13 DIAGNOSIS — I10 Essential (primary) hypertension: Secondary | ICD-10-CM

## 2011-07-13 LAB — POCT I-STAT, CHEM 8
BUN: 9 mg/dL (ref 6–23)
Calcium, Ion: 1.17 mmol/L (ref 1.12–1.32)
Chloride: 103 mEq/L (ref 96–112)
Creatinine, Ser: 1 mg/dL (ref 0.50–1.10)
Glucose, Bld: 91 mg/dL (ref 70–99)
HCT: 47 % — ABNORMAL HIGH (ref 36.0–46.0)
Hemoglobin: 16 g/dL — ABNORMAL HIGH (ref 12.0–15.0)
Potassium: 3.4 mEq/L — ABNORMAL LOW (ref 3.5–5.1)
Sodium: 143 mEq/L (ref 135–145)
TCO2: 27 mmol/L (ref 0–100)

## 2011-07-13 MED ORDER — GUAIFENESIN-CODEINE 100-10 MG/5ML PO SYRP: ORAL_SOLUTION | ORAL | Status: AC

## 2011-07-13 MED ORDER — ACETAMINOPHEN 325 MG PO TABS
650.0000 mg | ORAL_TABLET | Freq: Once | ORAL | Status: AC
  Administered 2011-07-13: 650 mg via ORAL

## 2011-07-13 MED ORDER — HYDROCHLOROTHIAZIDE 25 MG PO TABS: 25.0000 mg | ORAL_TABLET | Freq: Every day | ORAL | Status: DC

## 2011-07-13 MED ORDER — CLONIDINE HCL 0.1 MG PO TABS
0.1000 mg | ORAL_TABLET | Freq: Once | ORAL | Status: AC
  Administered 2011-07-13: 0.1 mg via ORAL

## 2011-07-13 MED ORDER — CLONIDINE HCL 0.1 MG PO TABS
ORAL_TABLET | ORAL | Status: AC
  Filled 2011-07-13: qty 1

## 2011-07-13 MED ORDER — ACETAMINOPHEN 325 MG PO TABS
ORAL_TABLET | ORAL | Status: AC
  Filled 2011-07-13: qty 2

## 2011-07-13 NOTE — ED Notes (Signed)
Pt reports sore throat, bilat ear pain, headache and cough that started this past Sat. Denies fever. Toleration PO intake. BS CTA. Pt has tried OTC contac with some relief of headache.

## 2011-07-13 NOTE — ED Provider Notes (Signed)
History     CSN: [id number]  Arrival date & time 07/13/11  1055   None     Chief Complaint  Patient presents with  . Sore Throat  . Otalgia  . Headache  . Cough    (Consider location/radiation/quality/duration/timing/severity/associated sxs/prior treatment) HPI Comments: Patient presents with complaints of sore throat, productive cough with clear clear phlegm, nasal congestion, sneezing and headache 5 days ago. She has been afebrile. She has been taking Contact at home without improvement though denies taking any today. She also has a history of hypertension and has been out of her blood pressure medications for the last 2 months. She states that she has been unable to see her primary care doctor but because she is currently uninsured and cannot afford the office visit. Her PCP will not refill her medication any more until she is seen. She hopes to afford an OV the end of this month. She denies chest pain, peripheral edema or dyspnea. 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    Past Medical History  Diagnosis Date  . Hypertension   . Hiatal hernia   . Chronic back pain   . Anemia   . Migraine headache   . Thyroid disease     Past Surgical History  Procedure Date  . Cesarean section 1979 1986 1990  . Appendectomy 2006  . Abdominal hysterectomy 2006    Family History  Problem Relation Age of Onset  . Heart disease Other   . Diabetes Other     History  Substance Use Topics  . Smoking status: Current Everyday Smoker -- 0.2 packs/day    Types: Cigarettes  . Smokeless tobacco: Not on file  . Alcohol Use: No    OB History    Grav Para Term Preterm Abortions TAB SAB Ect Mult Living                  Review of Systems  Constitutional: Positive for fever, chills, appetite change and fatigue.  HENT: Positive for congestion, sore throat, rhinorrhea and sinus pressure. Negative for ear pain, sneezing and postnasal drip.   Respiratory:  Positive for cough. Negative for shortness of breath and wheezing.   Cardiovascular: Negative for chest pain, palpitations and leg swelling.  Gastrointestinal: Negative for nausea, vomiting and abdominal pain.  Neurological: Positive for headaches. Negative for dizziness.    Allergies  Aspirin  Home Medications   Current Outpatient Rx  Name Route Sig Dispense Refill  . CALCIUM-MAGNESIUM-ZINC 500-250-12.5 MG PO TABS Oral Take 1 tablet by mouth 2 (two) times daily.      Dewayne Shorter SEED OIL Oral Take 1 tablet by mouth 3 (three) times daily.      Marland Kitchen VITAMIN D 1000 UNITS PO TABS Oral Take 1,000 Units by mouth daily.      . CYANOCOBALAMIN 1000 MCG PO TABS Oral Take 200 mcg by mouth daily.      Marland Kitchen GARLIC PO Oral Take 1 tablet by mouth daily.    . GUAIFENESIN-CODEINE 100-10 MG/5ML PO SYRP  1-2 tsp every 6 hrs prn cough 120 mL 0  . HYDROCHLOROTHIAZIDE 25 MG PO TABS Oral Take 1 tablet (25 mg total) by mouth daily. 30 tablet 0    BP 180/105  Pulse 66  Temp(Src) 98.2 F (36.8 C) (Oral)  Resp 16  SpO2 100%  Physical Exam  Nursing note and vitals reviewed. Constitutional: She appears well-developed and well-nourished. No distress.  HENT:  Head: Normocephalic and atraumatic.  Right Ear: Tympanic membrane, external ear and ear canal normal.  Left Ear: Tympanic membrane, external ear and ear canal normal.  Nose: Nose normal.  Mouth/Throat: Uvula is midline, oropharynx is clear and moist and mucous membranes are normal. No oropharyngeal exudate, posterior oropharyngeal edema or posterior oropharyngeal erythema.  Eyes: Conjunctivae, EOM and lids are normal. Pupils are equal, round, and reactive to light.  Neck: Neck supple.  Cardiovascular: Normal rate, regular rhythm and normal heart sounds.   Pulmonary/Chest: Effort normal and breath sounds normal. No respiratory distress.  Lymphadenopathy:    She has no cervical adenopathy.  Neurological: She is alert. No cranial nerve deficit.  Reflex  Scores:      Bicep reflexes are 2+ on the right side and 2+ on the left side. Skin: Skin is warm and dry.  Psychiatric: She has a normal mood and affect.    ED Course  Procedures (including critical care time)  Labs Reviewed  POCT I-STAT, CHEM 8 - Abnormal; Notable for the following:    Potassium 3.4 (*)    Hemoglobin 16.0 (*)    HCT 47.0 (*)    All other components within normal limits  I-STAT, CHEM 8   No results found.   1. Viral respiratory illness   2. Uncontrolled hypertension       MDM  Viral respiratory symptoms w/o fever.  Uncontrolled hypertension. No medication x 2 months.  Pt reports improvement of HA with Clonidine and Tylenol. Encouraged medication compliance for HTN and f/u with PCP in one week.         Melody Comas, [place] 07/13/11 1328

## 2011-07-14 NOTE — ED Provider Notes (Signed)
Medical screening examination/treatment/procedure(s) were performed by non-physician practitioner and as supervising physician I was immediately available for consultation/collaboration.   MORENO-COLL,Taequan Stockhausen; MD   Zalmen Wrightsman Moreno-Coll, MD 07/14/11 2121 

## 2011-08-26 ENCOUNTER — Other Ambulatory Visit: Payer: Self-pay | Admitting: Internal Medicine

## 2011-08-26 DIAGNOSIS — E039 Hypothyroidism, unspecified: Secondary | ICD-10-CM

## 2011-08-31 ENCOUNTER — Ambulatory Visit
Admission: RE | Admit: 2011-08-31 | Discharge: 2011-08-31 | Disposition: A | Discharge status: Home / Self Care | Payer: No Typology Code available for payment source | Source: Ambulatory Visit | Attending: Internal Medicine | Admitting: Internal Medicine

## 2011-08-31 DIAGNOSIS — E039 Hypothyroidism, unspecified: Secondary | ICD-10-CM

## 2011-09-08 ENCOUNTER — Encounter (HOSPITAL_COMMUNITY): Payer: Self-pay | Admitting: *Deleted

## 2011-09-08 ENCOUNTER — Emergency Department (HOSPITAL_COMMUNITY): Payer: Self-pay

## 2011-09-08 ENCOUNTER — Emergency Department (HOSPITAL_COMMUNITY)
Admission: EM | Admit: 2011-09-08 | Discharge: 2011-09-08 | Disposition: A | Discharge status: Home / Self Care | Payer: Self-pay | Attending: Emergency Medicine | Admitting: Emergency Medicine

## 2011-09-08 DIAGNOSIS — M542 Cervicalgia: Secondary | ICD-10-CM | POA: Insufficient documentation

## 2011-09-08 DIAGNOSIS — R1310 Dysphagia, unspecified: Secondary | ICD-10-CM | POA: Insufficient documentation

## 2011-09-08 DIAGNOSIS — Z79899 Other long term (current) drug therapy: Secondary | ICD-10-CM | POA: Insufficient documentation

## 2011-09-08 DIAGNOSIS — H9209 Otalgia, unspecified ear: Secondary | ICD-10-CM | POA: Insufficient documentation

## 2011-09-08 DIAGNOSIS — I10 Essential (primary) hypertension: Secondary | ICD-10-CM | POA: Insufficient documentation

## 2011-09-08 LAB — CBC
HCT: 39.2 % (ref 36.0–46.0)
Hemoglobin: 13.4 g/dL (ref 12.0–15.0)
MCH: 27.6 pg (ref 26.0–34.0)
MCHC: 34.2 g/dL (ref 30.0–36.0)
MCV: 80.7 fL (ref 78.0–100.0)
Platelets: 308 10*3/uL (ref 150–400)
RBC: 4.86 MIL/uL (ref 3.87–5.11)
RDW: 13.4 % (ref 11.5–15.5)
WBC: 8.4 10*3/uL (ref 4.0–10.5)

## 2011-09-08 LAB — POCT I-STAT, CHEM 8
BUN: 10 mg/dL (ref 6–23)
Calcium, Ion: 1.22 mmol/L (ref 1.12–1.32)
Chloride: 103 mEq/L (ref 96–112)
Creatinine, Ser: 0.8 mg/dL (ref 0.50–1.10)
Glucose, Bld: 89 mg/dL (ref 70–99)
HCT: 41 % (ref 36.0–46.0)
Hemoglobin: 13.9 g/dL (ref 12.0–15.0)
Potassium: 3.3 mEq/L — ABNORMAL LOW (ref 3.5–5.1)
Sodium: 141 mEq/L (ref 135–145)
TCO2: 27 mmol/L (ref 0–100)

## 2011-09-08 LAB — DIFFERENTIAL
Basophils Absolute: 0.1 10*3/uL (ref 0.0–0.1)
Basophils Relative: 1 % (ref 0–1)
Eosinophils Absolute: 0.3 10*3/uL (ref 0.0–0.7)
Eosinophils Relative: 3 % (ref 0–5)
Lymphocytes Relative: 41 % (ref 12–46)
Lymphs Abs: 3.5 10*3/uL (ref 0.7–4.0)
Monocytes Absolute: 0.4 10*3/uL (ref 0.1–1.0)
Monocytes Relative: 5 % (ref 3–12)
Neutro Abs: 4.2 10*3/uL (ref 1.7–7.7)
Neutrophils Relative %: 50 % (ref 43–77)

## 2011-09-08 MED ORDER — SODIUM CHLORIDE 0.9 % IV SOLN
Freq: Once | INTRAVENOUS | Status: AC
  Administered 2011-09-08: 04:00:00 via INTRAVENOUS

## 2011-09-08 MED ORDER — ONDANSETRON HCL 4 MG/2ML IJ SOLN
4.0000 mg | Freq: Once | INTRAMUSCULAR | Status: AC
  Administered 2011-09-08: 4 mg via INTRAVENOUS
  Filled 2011-09-08: qty 2

## 2011-09-08 MED ORDER — IOHEXOL 300 MG/ML  SOLN
100.0000 mL | Freq: Once | INTRAMUSCULAR | Status: AC | PRN
  Administered 2011-09-08: 100 mL via INTRAVENOUS

## 2011-09-08 MED ORDER — HYDROCODONE-ACETAMINOPHEN 7.5-500 MG/15ML PO SOLN: 5.0000 mL | Freq: Four times a day (QID) | ORAL | Status: AC | PRN

## 2011-09-08 MED ORDER — MORPHINE SULFATE 4 MG/ML IJ SOLN
4.0000 mg | Freq: Once | INTRAMUSCULAR | Status: AC
  Administered 2011-09-08: 4 mg via INTRAVENOUS
  Filled 2011-09-08: qty 1

## 2011-09-08 NOTE — ED Provider Notes (Signed)
History     CSN: [id number]  Arrival date & time 09/08/11  0038   First MD Initiated Contact with Patient 09/08/11 0242      Chief Complaint  Patient presents with  . Sore Throat    (Consider location/radiation/quality/duration/timing/severity/associated sxs/prior treatment) HPI Comments: Patient has had for greater than one month.  Throat pain, and dysphagia.  She has been seen by Dr. Concepcion Elk several times.  She recently had an ultrasound of her thyroid to 2 enlargement.,  Which revealed a small enlargement on the right side that we'll need to be monitored.  She says she has persistent neck pain, just at the sternoclavicular joint without history of trauma swelling erythema.  She states she has been treated with multiple courses of antibiotics for sinusitis without any relief of her "throat pain"  Patient is a 51 y.o. female presenting with pharyngitis. The history is provided by the patient.  Sore Throat This is a chronic problem. The current episode started more than 1 month ago. The problem occurs constantly. The problem has been unchanged. Associated symptoms include neck pain, a sore throat and swollen glands. Pertinent negatives include no congestion, fever, headaches or nausea. The symptoms are aggravated by eating and swallowing. The treatment provided no relief.    Past Medical History  Diagnosis Date  . Hypertension   . Hiatal hernia   . Chronic back pain   . Anemia   . Migraine headache   . Thyroid disease     Past Surgical History  Procedure Date  . Cesarean section 1979 1986 1990  . Appendectomy 2006  . Abdominal hysterectomy 2006    Family History  Problem Relation Age of Onset  . Heart disease Other   . Diabetes Other     History  Substance Use Topics  . Smoking status: Current Everyday Smoker -- 0.2 packs/day    Types: Cigarettes  . Smokeless tobacco: Not on file  . Alcohol Use: No    OB History    Grav Para Term Preterm Abortions TAB SAB Ect  Mult Living                  Review of Systems  Constitutional: Negative for fever.  HENT: Positive for ear pain, sore throat, trouble swallowing and neck pain. Negative for nosebleeds, congestion, facial swelling, rhinorrhea, mouth sores, neck stiffness and tinnitus.   Gastrointestinal: Negative for nausea.  Musculoskeletal: Negative for back pain.  Neurological: Negative for dizziness and headaches.    Allergies  Aspirin  Home Medications   Current Outpatient Rx  Name Route Sig Dispense Refill  . AMLODIPINE-OLMESARTAN 10-40 MG PO TABS Oral Take 1 tablet by mouth daily.    Marland Kitchen CALCIUM-MAGNESIUM-ZINC 500-250-12.5 MG PO TABS Oral Take 1 tablet by mouth 2 (two) times daily.      Dewayne Shorter SEED OIL Oral Take 1 tablet by mouth 3 (three) times daily.      Marland Kitchen VITAMIN D 1000 UNITS PO TABS Oral Take 1,000 Units by mouth daily.      . CYANOCOBALAMIN 1000 MCG PO TABS Oral Take 200 mcg by mouth daily.      Marland Kitchen FERROUS SULFATE 325 (65 FE) MG PO TABS Oral Take 325 mg by mouth daily with breakfast.    . GARLIC PO Oral Take 1 tablet by mouth daily.    Marland Kitchen HYDROCHLOROTHIAZIDE 25 MG PO TABS Oral Take 1 tablet (25 mg total) by mouth daily. 30 tablet 0  . LEVOTHYROXINE SODIUM 50 MCG PO TABS  Oral Take 50 mcg by mouth daily.    . NEBIVOLOL HCL 10 MG PO TABS Oral Take 10 mg by mouth daily.    Marland Kitchen HYDROCODONE-ACETAMINOPHEN 7.5-500 MG/15ML PO SOLN Oral Take 5 mLs by mouth every 6 (six) hours as needed for pain. 120 mL 0    BP 129/84  Pulse 62  Temp(Src) 98.8 F (37.1 C) (Oral)  Resp 14  SpO2 93%  Physical Exam  ED Course  Procedures (including critical care time)  Labs Reviewed  POCT I-STAT, CHEM 8 - Abnormal; Notable for the following:    Potassium 3.3 (*)    All other components within normal limits  CBC  DIFFERENTIAL   Ct Soft Tissue Neck W Contrast  09/08/2011  *RADIOLOGY REPORT*  Clinical Data: Right-sided neck pain, sore throat.  CT NECK WITH CONTRAST  Technique:  Multidetector CT imaging of  the neck was performed with intravenous contrast.  Contrast: OMNIPAQUE IOHEXOL 300 MG/ML IJ SOLN  Comparison: 05/04/2011  Findings: Lung apices are predominately clear.  The visualized intracranial contents are within normal limits. The visualized paranasal sinuses and mastoid air cells are clear.  The nasal cavity and nasopharynx are within normal limits.  There is asymmetry of the left palatine tonsil with low attenuation (image 42 of series 604).  Normal appearance to the epiglottis and glottis. No appreciable adenopathy.  Symmetric parotid and submandibular glands.  Bilateral carotid and jugular vasculature within normal limits. Atrophic thyroid gland.  No acute osseous abnormality.  IMPRESSION: Left tonsillar hypoattenuation is nonspecific. Correlate with clinical history and direct inspection if concerned for developing infection or less likely mass.  Original Report Authenticated By: Waneta Martins, M.D.     1. Neck pain on right side   2. Difficulty swallowing       MDM  CT neck due to patient's chronic complaint, looking for posterior pharyngeal abscess, Mass,         Arman Filter, NP 09/08/11 [phone]

## 2011-09-08 NOTE — ED Notes (Signed)
Pt in c/o sore throat and swelling x3 months, dx with sinus infection, worse over last few days

## 2011-09-08 NOTE — ED Provider Notes (Signed)
Medical screening examination/treatment/procedure(s) were performed by non-physician practitioner and as supervising physician I was immediately available for consultation/collaboration.   Hanley Seamen, MD 09/08/11 [phone]

## 2011-09-08 NOTE — ED Notes (Signed)
NP at bedside. NP Manus Rudd, at bedside.

## 2011-09-08 NOTE — Discharge Instructions (Signed)
Today your CT scan of your neck is normal.  Your white count, which indicates infection is normal.  Your electrolytes are in normal as well.  No definitive cause for your discomfort has been ascertained tonight .  Please followup with Dr. Claudie Revering.  You have been given a prescription for pain medication.  Please use this as needed

## 2011-09-21 ENCOUNTER — Encounter: Payer: Self-pay | Admitting: Family Medicine

## 2011-09-21 ENCOUNTER — Ambulatory Visit (INDEPENDENT_AMBULATORY_CARE_PROVIDER_SITE_OTHER): Payer: No Typology Code available for payment source | Admitting: Family Medicine

## 2011-09-21 VITALS — BP 132/76 | HR 60 | Temp 98.2°F | Ht 65.0 in | Wt 179.0 lb

## 2011-09-21 DIAGNOSIS — Z720 Tobacco use: Secondary | ICD-10-CM

## 2011-09-21 DIAGNOSIS — R070 Pain in throat: Secondary | ICD-10-CM | POA: Insufficient documentation

## 2011-09-21 DIAGNOSIS — F17200 Nicotine dependence, unspecified, uncomplicated: Secondary | ICD-10-CM

## 2011-09-21 DIAGNOSIS — I10 Essential (primary) hypertension: Secondary | ICD-10-CM | POA: Insufficient documentation

## 2011-09-21 DIAGNOSIS — Z87891 Personal history of nicotine dependence: Secondary | ICD-10-CM | POA: Insufficient documentation

## 2011-09-21 DIAGNOSIS — D649 Anemia, unspecified: Secondary | ICD-10-CM | POA: Insufficient documentation

## 2011-09-21 DIAGNOSIS — E039 Hypothyroidism, unspecified: Secondary | ICD-10-CM | POA: Insufficient documentation

## 2011-09-21 HISTORY — DX: Pain in throat: R07.0

## 2011-09-21 NOTE — Patient Instructions (Signed)
Nice to meet you!  Make appointment for pharmacy clinic for ambulatory blood pressure monitoring.  [place] [facility] smoking counseling: 1-800-QUIT-NOW  Take a daily antihistamine for your allergies (may help with drainage causing sore throat and itchy eyes)  Consider generic zyrtec or claritin.  Let me know if Jaynee Eagles knows of available ENT referral for uninsured patients.and I would be glad to refer you.  At your next appointment (please make for 2-3 weeks after pharmacy clinic) we will go over your results from blood pressure clinic, your records from [facility]

## 2011-09-21 NOTE — Assessment & Plan Note (Signed)
Patient states was checked at prior [facility] in past month.  Will get records.

## 2011-09-21 NOTE — Assessment & Plan Note (Signed)
Intermittent throat pain and dysphagia.  Ultrasound and CT of neck do not show any mass lesion (March 2013).  Seasonal allergies may be playing a role- advised to start taking zyrtec.  Would like to refer to ENT, limited by no insurance.  Patient will inquire with hospital indigent program about resources.

## 2011-09-21 NOTE — Assessment & Plan Note (Addendum)
Has tried Chantix before but had possible allergic reaction.  Has been successful in cutting back on her own.  Counseled on [place] quit line and possible availability of free nicotine replacement products.

## 2011-09-21 NOTE — Assessment & Plan Note (Addendum)
Chronic, no known cause per patient.  Hospital records indicate normal CBC.  May consider d/c iron supplements in future.

## 2011-09-21 NOTE — Progress Notes (Signed)
  Subjective:    Patient ID: Teresa [name], female    DOB: December 04, 1960, 51 y.o.   MRN: [id number]  HPINew patient  HYPERTENSION  BP Readings from Last 3 Encounters:  09/21/11 158/82  09/08/11 129/84  07/13/11 180/105    Hypertension ROS: taking medications as instructed, no medication side effects noted, no TIA's, no chest pain on exertion, no dyspnea on exertion and no swelling of ankles.   Dysphagia:  Trouble for several months.  Intermittent.  Sudden onset, with right under jaw pain.  No associated with swelling, trouble breathing.  ? Itchiness in throat.  Episode lasted until she got narcotic and for next 3 days.  Notes has been hoarse.       Review of SystemsGen:  No fever, chills, unexplained weight loss Ears:  Feels her hearing is worse bilaterally, using sweet oil Eyes: Notes Blurry vision, white spots Nose:  No rhinorrhea, congestion Throat:   Trouble swallowing CV:  No chest pain, palpitations, dyspnea on exertion, or edema Resp: No cough, dyspnea, wheezing Abd: No nausea, vomting, diarrhea, constipation, or change in bowel color, size, or caliber. MSK: joint pain in back and neck Neuro:  No headache, numbness, weakness  Notes tingnlging in feet.       Objective:   Physical Exam  GEN: Alert & Oriented, No acute distress HEENT: [place]/AT. EOMI, PERRLA, no conjunctival injection or scleral icterus.  Bilateral tympanic membranes intact without erythema or effusion.  .  Nares without edema or rhinorrhea.  Oropharynx is without erythema or exudates.  No anterior or posterior cervical lymphadenopathy.  No thyromgelaly or nodules. CV:  Regular Rate & Rhythm, no murmur Respiratory:  Normal work of breathing, CTAB Abd:  + BS, soft, no tenderness to palpation Ext: no pre-tibial edema Psych:  Normal judgment, momry.  Mood and affect euthymic.       Assessment & Plan:

## 2011-09-21 NOTE — Assessment & Plan Note (Addendum)
Strong family history of early CAD, Stroke, Renal failure.   Brings in BP meds today- is compliant on 4 agents.  Before making changes to long term meds or evaluating renal artery (uninsured) will get ambulatory BP monitoring.  Will follow-up after appt with pharmacy clinic.  Supplements she takes are for blood pressure.  Discussed I do not feel they are necessary and if financial limitation, may consider d/c. (garlic, calcium.mg/zinc, B12, vit D)  Continue daily multivitamin.

## 2011-09-26 ENCOUNTER — Ambulatory Visit (INDEPENDENT_AMBULATORY_CARE_PROVIDER_SITE_OTHER): Payer: No Typology Code available for payment source | Admitting: Family Medicine

## 2011-09-26 ENCOUNTER — Encounter: Payer: Self-pay | Admitting: Pharmacist

## 2011-09-26 ENCOUNTER — Encounter: Payer: Self-pay | Admitting: Family Medicine

## 2011-09-26 ENCOUNTER — Ambulatory Visit (INDEPENDENT_AMBULATORY_CARE_PROVIDER_SITE_OTHER): Payer: No Typology Code available for payment source | Admitting: Pharmacist

## 2011-09-26 VITALS — BP 142/82 | HR 88 | Temp 98.6°F | Ht 65.0 in | Wt 181.0 lb

## 2011-09-26 DIAGNOSIS — J019 Acute sinusitis, unspecified: Secondary | ICD-10-CM

## 2011-09-26 DIAGNOSIS — J209 Acute bronchitis, unspecified: Secondary | ICD-10-CM

## 2011-09-26 DIAGNOSIS — J029 Acute pharyngitis, unspecified: Secondary | ICD-10-CM

## 2011-09-26 LAB — POCT RAPID STREP A (OFFICE): Rapid Strep A Screen: NEGATIVE

## 2011-09-26 MED ORDER — GUAIFENESIN-CODEINE 100-10 MG/5ML PO SYRP: 5.0000 mL | ORAL_SOLUTION | Freq: Three times a day (TID) | ORAL | Status: DC | PRN

## 2011-09-26 MED ORDER — AZITHROMYCIN 500 MG PO TABS: 500.0000 mg | ORAL_TABLET | Freq: Every day | ORAL | Status: AC

## 2011-09-26 NOTE — Assessment & Plan Note (Signed)
Advised to quit smoking. She will call the quitline.  Advised to use gum.

## 2011-09-26 NOTE — Assessment & Plan Note (Signed)
Will use zithromax for 5 days.

## 2011-09-26 NOTE — Patient Instructions (Signed)
You have bronchitis and sinusitis. This is most likely VIRAL related and will clear up on its own. I will treat you with antibiotics for five days to make sure there is no bacterial component.  This is directly related to your smoking habit. If you quit smoking you will not have bronchitis anymore. Smoking will give you COPD and you will have to use oxygen to breath. You are almost 51, it is time to quit for real.   Please call 1800 quit now

## 2011-09-26 NOTE — Progress Notes (Signed)
  Subjective:    Patient ID: Teresa [name], female    DOB: 04-04-1961, 51 y.o.   MRN: [id number]  HPI 1. Cough/sinus pain/left ear pain Patient has 4 day history of cough with sputum production. She is a smoker 6 per day, down from pack per day. She has not had a fever. No n/v/d/ abd. Pain. No hemoptysis. She has not had sick contacts.she has had sinusitis and bronchitis in the past. She has no SOB. No appetite loss. No weight loss.  Strep negative today.   Tobacco use: Patient is a smoker. Advised patient to about the risks of smoking to health.  Review of Systems Pertinent items are noted in HPI.    Objective:   Physical Exam Vital signs: 98.6. AVSS  Lungs:  Normal respiratory effort, chest expands symmetrically. Lungs are clear to auscultation, no crackles or wheezes. Heart - Regular rate and rhythm.  No murmurs, gallops or rubs.    Throat: normal mucosa, no exudate, uvula midline, no redness Ears:  External ear exam shows no significant lesions or deformities.  TM intact, no bulging. Nose:  External nasal examination shows no deformity or inflammation. Nasal mucosa are pink and moist without lesions or exudates. No septal dislocation or dislocation.No obstruction to airflow.     Assessment & Plan:

## 2011-09-27 MED ORDER — AZITHROMYCIN 500 MG PO TABS: 500.0000 mg | ORAL_TABLET | Freq: Every day | ORAL | Status: AC

## 2011-09-27 MED ORDER — GUAIFENESIN-CODEINE 100-10 MG/5ML PO SYRP: 5.0000 mL | ORAL_SOLUTION | Freq: Three times a day (TID) | ORAL | Status: AC | PRN

## 2011-09-27 NOTE — Progress Notes (Signed)
Opened encounter to sign off medications.

## 2011-10-06 ENCOUNTER — Encounter: Payer: Self-pay | Admitting: Pharmacist

## 2011-10-06 ENCOUNTER — Ambulatory Visit (INDEPENDENT_AMBULATORY_CARE_PROVIDER_SITE_OTHER): Payer: No Typology Code available for payment source | Admitting: Pharmacist

## 2011-10-06 VITALS — BP 152/88 | Ht 65.0 in | Wt 182.8 lb

## 2011-10-06 DIAGNOSIS — I10 Essential (primary) hypertension: Secondary | ICD-10-CM

## 2011-10-06 NOTE — Progress Notes (Signed)
Subjective:    Patient ID: Teresa [name], female    DOB: Jun 23, 1961, 51 y.o.   MRN: [id number]  HPI  50yoAA female with a long history of hypertension (>20 years) and strong family history of early CAD, stroke, CKD presented to the clinic for 24-hour ambulatory blood pressure monitoring. She has had several elevated office BP readings despite being adherent with her medications. She gets her Azor and Bystolic through samples (Dr. Norris Cross??) since she does not have insurance coverage; discussed that we will keep that in mind if we need possible medication changes. She is a current smoker but is interested in quitting. She has cut down on the number of cigarettes she smokes and is planning on calling 1-800-QUIT-NOW. She also reports having significant trouble falling asleep, where she lays down to sleep around 9:30pm but does not fall asleep until 1-2am but is able to stay asleep after that.  Discussed procedure for wearing monitor and gave directions to come back to the clinic on 4/12 for monitor removal. Patient denied any significant issues during the day other than slight discomfort x 2. Reports having issues falling asleep (went to sleep at 10:00pm but didn't fall asleep until ~2:30am). Says she kept thinking about her blood pressure all day but other than that things went well. Also reports calling the 1-800-QUIT-NOW number and she will be getting lozenges sent to her. She reported some visual changes that she described as "kaleidoscope-like," but denied having issues today. She says it occurs occasionally (maybe once every 1-2 months). Told her to follow-up with Dr. Earnest Bailey or an eye doctor and to monitor BP at home during these episodes to rule out elevated BP causes.   Current Antihypertensive Regimen: HCTZ 25mg  QAM Azor (Amlodipine/Olmesartan) 10-40mg  QAM Bystolic (Nebivolol) 10mg  QAM  Allergies: Aspirin (nausea) Clonidine (facial swelling)  Review of Systems     Objective:   Physical  Exam  Previous Office BP readings: 09/26/11: 142/82 mmHg 09/21/11: 132/76 mmHg 07/13/11: 180/105 mmHg  Today's BP reading (manual after sitting several minutes): 152/88 mmHg  24-ABPM Study Data:  Arm Placement right arm   Overall Mean 24hr BP:   152/ 88 mmHg HR: 65    Daytime Mean BP:    155/90 mmHg HR: 69  Nighttime Mean BP:    146/83 mmHg HR: 58  Dipping Pattern:  Abnormal (-5.9% Sys, -8.4% Dias)     [normal dipping ~10-20%]  BMET    Component Value Date/Time   NA 141 09/08/2011 0316   K 3.3* 09/08/2011 0316   CL 103 09/08/2011 0316   CO2 27 05/04/2011 2115   GLUCOSE 89 09/08/2011 0316   BUN 10 09/08/2011 0316   CREATININE 0.80 09/08/2011 0316   CALCIUM 10.0 05/04/2011 2115   GFRNONAA >90 05/04/2011 2115   GFRAA >90 05/04/2011 2115       Assessment & Plan:   50yoF with significant family history of CAD, stroke, CKD with uncontrolled hypertension (goal <140/90 mmHg) based on 24-hour ambulatory blood pressure average of 152/88 mmHg with abnormal dipping pattern. Normal diurnal dipping pattern may be blunted due to reported issues with sleep, so addressing this issue at follow-up may improve overnight BP control in addition to recommending patient to start taking Azor combination pill in the evening. Patient would also benefit from additional drug therapy as overall average is high. Initiated spironolactone 25mg  daily. Discussed with patient and patient verbalized understanding. Follow-up for BP and BMET check next Friday, April 19th. Patient seen with Benjaman Pott, PharmD Resident and  Zoe Lan, PharmD Resident.

## 2011-10-06 NOTE — Patient Instructions (Addendum)
It was good to meet you!  Your 24-hour average blood pressure was 152/88 mmHg (which is above goal of less than 140/90 mmHg)  1) Start taking Spironolactone 25mg  once daily 2) Starting tomorrow, start taking Azor tablet in the evening 3) Come back to the clinic next week to check your blood pressure and get some blood work. 4) Call the clinic if you have any questions or experience any issues. 5) Follow-up with Dr. Earnest Bailey and/or eye doctor in regards to your vision changes.   Clinic follow-up with Dr. Raymondo Band next Friday April 19th at 9:45.

## 2011-10-07 MED ORDER — SPIRONOLACTONE 25 MG PO TABS: 25.0000 mg | ORAL_TABLET | Freq: Every day | ORAL | Status: DC

## 2011-10-07 NOTE — Progress Notes (Signed)
  Subjective:    Patient ID: Teresa [name], female    DOB: 30-Dec-1960, 51 y.o.   MRN: [id number]  HPI Reviewed and agree with Dr. Macky Lower management.    Review of Systems     Objective:   Physical Exam        Assessment & Plan:

## 2011-10-07 NOTE — Assessment & Plan Note (Signed)
  50yoF with significant family history of CAD, stroke, CKD with uncontrolled hypertension (goal <140/90 mmHg) based on 24-hour ambulatory blood pressure average of 152/88 mmHg with abnormal dipping pattern. Normal diurnal dipping pattern may be blunted due to reported issues with sleep, so addressing this issue at follow-up may improve overnight BP control in addition to recommending patient to start taking Azor combination pill in the evening. Patient would also benefit from additional drug therapy as overall average is high. Initiated spironolactone 25mg  daily. Discussed with patient and patient verbalized understanding. Follow-up for BP and BMET check next Friday, April 19th. Patient seen with Benjaman Pott, PharmD Resident and Zoe Lan, PharmD Resident.

## 2011-10-14 ENCOUNTER — Encounter: Payer: Self-pay | Admitting: Pharmacist

## 2011-10-14 ENCOUNTER — Ambulatory Visit (INDEPENDENT_AMBULATORY_CARE_PROVIDER_SITE_OTHER): Payer: Self-pay | Admitting: Pharmacist

## 2011-10-14 ENCOUNTER — Telehealth: Payer: Self-pay | Admitting: Family Medicine

## 2011-10-14 VITALS — BP 150/78 | HR 64 | Ht 65.0 in | Wt 181.3 lb

## 2011-10-14 DIAGNOSIS — I10 Essential (primary) hypertension: Secondary | ICD-10-CM

## 2011-10-14 DIAGNOSIS — Z720 Tobacco use: Secondary | ICD-10-CM

## 2011-10-14 DIAGNOSIS — F17200 Nicotine dependence, unspecified, uncomplicated: Secondary | ICD-10-CM

## 2011-10-14 MED ORDER — CARVEDILOL 12.5 MG PO TABS: 12.5000 mg | ORAL_TABLET | Freq: Two times a day (BID) | ORAL | Status: DC

## 2011-10-14 MED ORDER — SPIRONOLACTONE 25 MG PO TABS: 25.0000 mg | ORAL_TABLET | Freq: Every day | ORAL | Status: DC

## 2011-10-14 MED ORDER — LISINOPRIL-HYDROCHLOROTHIAZIDE 20-25 MG PO TABS: 1.0000 | ORAL_TABLET | Freq: Every day | ORAL | Status: DC

## 2011-10-14 MED ORDER — AMLODIPINE BESYLATE 10 MG PO TABS: 10.0000 mg | ORAL_TABLET | Freq: Every day | ORAL | Status: DC

## 2011-10-14 NOTE — Telephone Encounter (Signed)
Attempted call back left message - Apologized that amlodipine was NOT covered at [facility]   However [facility] does cover this for similar $10 per month (with 5 joining fee).   She was asked to call back and provide correct pharmacy [facility] was appropriate).   A few other options exist if [facility] is not acceptable.

## 2011-10-14 NOTE — Progress Notes (Signed)
  Subjective:    Patient ID: Teresa [name], female    DOB: 09/07/1960, 50 y.o.   MRN: [id number]  HPI Reviewed and agree with Dr. Koval's management.    Review of Systems     Objective:   Physical Exam        Assessment & Plan:   

## 2011-10-14 NOTE — Assessment & Plan Note (Signed)
50yoF with significant family history of CAD, stroke, CKD with resistant, uncontrolled hypertension (goal <140/90 mmHg). Patient is adherent to her medications but has been relying on samples, which she is almost run completely out of. Patient denies any intolerable side effects from her BP medicines, tolerating new spironolactone medication well. Patient is not surprised that her BP is above goal today, considering recent distressing family news re: sister and mother and lack of sleep last night after mother fell. She expresses willingness to spend ~$16-20 per month worth of HTN medications.   1. Replace nebivolol with carvedilol 12.5mg  PO BID. 2. Replace olmesartan/amlodipine and HCTZ with lisinopril/HCTZ 20mg /25mg  PO daily and amlodipine 10mg  PO qHS. 3. Continue taking spironolactone 25mg  PO daily. 4. Will reassess BMET on spironolactone and BP control at follow up visit 11/04/11. Morning pills: carvedilol, lisinopril/HCTZ combination pill, spironolactone Evening pills: carvedilol and amlodipine  Total time in face-to-face counseling 40 minutes.  Patient seen with Tana Conch, PharmD, Pharmacy Resident.

## 2011-10-14 NOTE — Progress Notes (Signed)
  Subjective:    Patient ID: Teresa [name], female    DOB: [birth year], 51 y.o.   MRN: [id number]  HPI Patient arrives distressed about recent family stresses. Pt denies hypotensive side effects from blood pressure medications. Tolerating spironolactone well, started during last visit.  Age when started using tobacco on a daily basis over 20 years ago. Number of Cigarettes per day down to 3-6. Brand smoked Misty Menthol light. Estimated Nicotine Content per Cigarette (mg) 0.5-0.8.  Estimated Nicotine intake per day 2-5 mg.   Smokes first cigarette after she returns from work, before starting homework. Estimated Fagerstrom Score <5/10.  Most recent quit attempt 1990. Longest time ever been tobacco free 1-2 years. Triggers to use tobacco include relaxation after work.   Review of Systems     Objective:   Physical Exam        Assessment & Plan:  51yoF with significant family history of CAD, stroke, CKD with resistant, uncontrolled hypertension (goal <140/90 mmHg). Patient is adherent to her medications but has been relying on samples, which she is almost run completely out of. Patient denies any intolerable side effects from her BP medicines, tolerating new spironolactone medication well. Patient is not surprised that her BP is above goal today, considering recent distressing family news re: sister and mother and lack of sleep last night after mother fell. She expresses willingness to spend ~$16-20 per month worth of HTN medications.   1. Replace nebivolol with carvedilol 12.5mg  PO BID. 2. Replace olmesartan/amlodipine and HCTZ with lisinopril/HCTZ 20mg /25mg  PO daily and amlodipine 10mg  PO qHS. 3. Continue taking spironolactone 25mg  PO daily. 4. Will reassess BMET on spironolactone and BP control at follow up visit 11/04/11. Morning pills: carvedilol, lisinopril/HCTZ combination pill, spironolactone Evening pills: carvedilol and amlodipine  Mild nicotine dependence of over 20 years  duration in a patient who is fair candidate for success b/c of lack of recent long-term quit and recent family stresses. Initiated nicotine replacement tx using lozenges through 1800 QUIT NOW program. Patients plans for quit date 10/25/11. Patient counseled on purpose, proper use, and potential adverse effects. F/U Rx Clinic Visit 11/04/11. Total time in face-to-face counseling 40 minutes.  Patient seen with Tana Conch, PharmD, Pharmacy Resident.

## 2011-10-14 NOTE — Assessment & Plan Note (Signed)
Mild nicotine dependence of over 20 years duration in a patient who is fair candidate for success b/c of lack of recent long-term quit and recent family stresses. Initiated nicotine replacement tx using lozenges through 1800 QUIT NOW program. Patients plans for quit date 10/25/11. Patient counseled on purpose, proper use, and potential adverse effects. F/U Rx Clinic Visit 11/04/11. Total time in face-to-face counseling 40 minutes.  Patient seen with Tana Conch, PharmD, Pharmacy Resident.

## 2011-10-14 NOTE — Patient Instructions (Addendum)
It is great to see you again today. Here are some changes to your blood pressure medications: 1. Stop taking Bystolic, Azor, and hydrochlorothiazide. 2. Start taking carvedilol 12.5mg  by mouth twice daily. This will replace your Bystolic. 3. Start taking amlodipine 10mg  by mouth at night before bed. This was part of Azor. 4. Start taking lisinopril/HCTZ 20mg /25mg  (has 2 medicines in one pill) by mouth daily.  5. Continue taking spironolactone 25mg  by mouth daily.  Morning pills: carvedilol, lisinopril/HCTZ combination pill, spironolactone Evening pills: carvedilol and amlodipine

## 2011-10-14 NOTE — Telephone Encounter (Signed)
The Rx for Norvasc (Amlodipine) is not $4.00, it will cost $60 and she needs it to be $4.00.

## 2011-10-17 MED ORDER — AMLODIPINE BESYLATE 10 MG PO TABS: 10.0000 mg | ORAL_TABLET | Freq: Every day | ORAL | Status: DC

## 2011-10-17 NOTE — Telephone Encounter (Signed)
Addended by: Kathrin Ruddy on: 10/17/2011 11:34 AM   Modules accepted: Orders

## 2011-10-17 NOTE — Telephone Encounter (Signed)
Clarified best pharmacy with patient.  Patient requested use of [employer].     New Rx for amlodipine 10mg  take 1 daily #90 with 3 refills provided.  Asked for patient drug discount list to be used.     Patient call returned and advised to make switch for amlodipine at [facility] and other 3 Rxs (spiro, lisino/HCTZ and Carvedilol) at [facility].

## 2011-10-19 ENCOUNTER — Ambulatory Visit: Payer: No Typology Code available for payment source | Admitting: Family Medicine

## 2011-10-20 ENCOUNTER — Encounter: Payer: Self-pay | Admitting: *Deleted

## 2011-10-20 ENCOUNTER — Telehealth: Payer: Self-pay | Admitting: Family Medicine

## 2011-10-20 NOTE — Telephone Encounter (Signed)
This encounter was created in error - please disregard.

## 2011-10-20 NOTE — Telephone Encounter (Addendum)
Patient states she started new meds on Monday of this week.  On Tuesday started with diarrhea , very light in color ( light tan ) Has diarrhea 3-4 times daily  but generally after eating.    Started with headache yesterday and today. Wakes up with headache .  Does not take anything and will usually go away as day goes on. Also feels discomfort in muscles of neck and shoulders . she has no appetite. No nausea but just doesn't feel like eating . Feet are swelling , but she stands on feet all day and thinks this has been going on since before starting any new meds. Hasn't taken meds today yet.  Advised will discuss with MD and call her back.

## 2011-10-20 NOTE — Telephone Encounter (Addendum)
Consulted with MD and she states these symptoms are generally not a common problem with any of these meds. She would recommend that patient continue meds now and if getting worse or she feels she cannot tolerate needs to come in for appointment.  Patient actually has appointment scheduled for tomorrow she states. She voices understanding. Offered appointment today but she declines.   Patient denies any fevers. Does report light headedness. Cautioned her about sudden change in position. Advised to  avoid greasy or fried foods.  Drinks plenty of fluids  while having diarrhea.

## 2011-10-20 NOTE — Telephone Encounter (Signed)
Pt changed meds and is now having diarrhea and headaches.  Dr Raymondo Band is the one that changed her meds.

## 2011-10-21 ENCOUNTER — Ambulatory Visit: Payer: No Typology Code available for payment source | Admitting: Family Medicine

## 2011-10-24 ENCOUNTER — Encounter: Payer: Self-pay | Admitting: Family Medicine

## 2011-10-24 ENCOUNTER — Ambulatory Visit (INDEPENDENT_AMBULATORY_CARE_PROVIDER_SITE_OTHER): Payer: Self-pay | Admitting: Family Medicine

## 2011-10-24 VITALS — BP 151/96 | HR 70 | Temp 98.4°F | Ht 65.0 in | Wt 182.6 lb

## 2011-10-24 DIAGNOSIS — E039 Hypothyroidism, unspecified: Secondary | ICD-10-CM

## 2011-10-24 DIAGNOSIS — I10 Essential (primary) hypertension: Secondary | ICD-10-CM

## 2011-10-24 DIAGNOSIS — R070 Pain in throat: Secondary | ICD-10-CM

## 2011-10-24 LAB — COMPREHENSIVE METABOLIC PANEL
ALT: 41 U/L — ABNORMAL HIGH (ref 0–35)
AST: 29 U/L (ref 0–37)
Albumin: 4.5 g/dL (ref 3.5–5.2)
Alkaline Phosphatase: 61 U/L (ref 39–117)
BUN: 13 mg/dL (ref 6–23)
CO2: 27 mEq/L (ref 19–32)
Calcium: 9.9 mg/dL (ref 8.4–10.5)
Chloride: 101 mEq/L (ref 96–112)
Creat: 0.85 mg/dL (ref 0.50–1.10)
Glucose, Bld: 112 mg/dL — ABNORMAL HIGH (ref 70–99)
Potassium: 3.9 mEq/L (ref 3.5–5.3)
Sodium: 138 mEq/L (ref 135–145)
Total Bilirubin: 0.4 mg/dL (ref 0.3–1.2)
Total Protein: 6.9 g/dL (ref 6.0–8.3)

## 2011-10-24 LAB — TSH: TSH: 11.468 u[IU]/mL — ABNORMAL HIGH (ref 0.350–4.500)

## 2011-10-24 NOTE — Assessment & Plan Note (Addendum)
Symptoms not typical for medications she is on.  Patient had previously been on spironolactone without symptoms so not likely the cause.  Will stop BP meds except for spironolactone and Lisinopril-HCTZ, if well tolerated, will add back amlodipine, if well tolerated there, will add back carvedilol.  Has follow-up appt with pharm clinic in approx 10 days  Will check CMET

## 2011-10-24 NOTE — Progress Notes (Signed)
  Subjective:    Patient ID: Teresa [name], female    DOB: 03/09/1961, 51 y.o.   MRN: [id number]  HPI Here for follow-up of hypertension  Has been seeing pharmacy clinic- many medications changed due to now uninsured- needed generic equivalent.  The day after she started new medications, had diarrhea, lighter colored stools, nausea, vision  Blurriness.  Continues to take medications with no improvement.    NO fever, pain, abdominal pain ,chest pain.    Review of Systems See HPI    Objective:   Physical Exam  GEN: Alert & Oriented, No acute distress CV:  Regular Rate & Rhythm, no murmur Respiratory:  Normal work of breathing, CTAB Abd:  + BS, soft, no tenderness to palpation Ext: no pre-tibial edema       Assessment & Plan:

## 2011-10-24 NOTE — Assessment & Plan Note (Addendum)
Will check TSH today 

## 2011-10-24 NOTE — Patient Instructions (Addendum)
Get record of labwork from doctor  Will check thyroid and kidney/potassium labs today  Will check out blood pressure medicines  Step 1: spironolactone + lisinopril/HCTZ Step 2: add on Amlodipine Step 3:  Add carvedilol  See Dr. Raymondo Band next week  Use nasal saline irrigation (neti pot) daily  Follow-up with me in 2 weeks.

## 2011-10-24 NOTE — Assessment & Plan Note (Signed)
Advised stopping smoking, daily nasal saline irrigation, antihistamine.  Unable to refer to ENT due to uninsured.

## 2011-10-25 ENCOUNTER — Telehealth: Payer: Self-pay | Admitting: Family Medicine

## 2011-10-25 NOTE — Telephone Encounter (Signed)
Called to discuss thyroid labs.

## 2011-10-27 MED ORDER — LEVOTHYROXINE SODIUM 100 MCG PO TABS: 50.0000 ug | ORAL_TABLET | Freq: Every day | ORAL | Status: DC

## 2011-10-27 NOTE — Telephone Encounter (Signed)
Patient will take increase from 50 to 75 mg, if no adverse symptoms, will go up to 100 mcg daily and recheck in 6-8 weeks.  I have sent in Rx for 100 mcg tablets.

## 2011-11-04 ENCOUNTER — Ambulatory Visit (INDEPENDENT_AMBULATORY_CARE_PROVIDER_SITE_OTHER): Payer: Self-pay | Admitting: Family Medicine

## 2011-11-04 ENCOUNTER — Encounter: Payer: Self-pay | Admitting: Family Medicine

## 2011-11-04 ENCOUNTER — Encounter: Payer: Self-pay | Admitting: Pharmacist

## 2011-11-04 ENCOUNTER — Ambulatory Visit (INDEPENDENT_AMBULATORY_CARE_PROVIDER_SITE_OTHER): Payer: Self-pay | Admitting: Pharmacist

## 2011-11-04 VITALS — BP 145/90 | HR 60 | Ht 65.0 in | Wt 183.6 lb

## 2011-11-04 VITALS — BP 159/92 | HR 62 | Temp 98.8°F | Ht 65.0 in | Wt 183.6 lb

## 2011-11-04 DIAGNOSIS — I10 Essential (primary) hypertension: Secondary | ICD-10-CM

## 2011-11-04 DIAGNOSIS — T6391XA Toxic effect of contact with unspecified venomous animal, accidental (unintentional), initial encounter: Secondary | ICD-10-CM

## 2011-11-04 DIAGNOSIS — Z720 Tobacco use: Secondary | ICD-10-CM

## 2011-11-04 DIAGNOSIS — T63301A Toxic effect of unspecified spider venom, accidental (unintentional), initial encounter: Secondary | ICD-10-CM

## 2011-11-04 DIAGNOSIS — F17200 Nicotine dependence, unspecified, uncomplicated: Secondary | ICD-10-CM

## 2011-11-04 DIAGNOSIS — T63391A Toxic effect of venom of other spider, accidental (unintentional), initial encounter: Secondary | ICD-10-CM

## 2011-11-04 MED ORDER — METOPROLOL TARTRATE 25 MG PO TABS: 12.5000 mg | ORAL_TABLET | Freq: Two times a day (BID) | ORAL | Status: DC

## 2011-11-04 NOTE — Patient Instructions (Addendum)
Great job with smoking cessation. Continue to watch and avoid triggers and life stressors: bingo, family, other stressors. Continue to use the nicotine lozenges as needed Stop Taking Carvedilol Start taking Metoprolol 12.5 mg twice a day on Monday Continue exercise regimen and pilates and as tolerated Continue to drink more water/liquids to overcome dehydration Follow up appointment in 1 month with Dr. Earnest Bailey

## 2011-11-04 NOTE — Assessment & Plan Note (Signed)
Possible arthropod bite.  Versus allergic irritation of the skin.   Does not appear to be infected currently. Plan to treat symptomatically with hydrocortisone, antibiotic ointments as needed. Recommended Zyrtec. Discussed signs or symptoms of infection. Please see discharge instructions. Patient expresses understanding.

## 2011-11-04 NOTE — Assessment & Plan Note (Signed)
Patient has been started back on blood pressure regimen and is tolerating the regimen. Blood pressure today is 145/90 (not at goal) but is improving. She tolerated restarting her medications but began feeling GI effects potentially from carvedilol. Plan to stop carvedilol now and change to Metoprolol 12.5 mg bid starting 11/07/11. Patient mentioning edema in lower extremities. This is could be from amlodipine as a potential cause, but swelling/edema is not a concern at this time and appears to potentially be due to long hours on her feet at work. Discussed wearing compression stockings at work to increase flow and periodic leg exercises. Discussed calling back if swelling worsens/unable to put shoes on. May consider decreasing or stopping amlodipine. Will hold off on this for now especially with starting different beta blocker. Patient to begin exercising at pilates. Counseled patient on drinking more water and minimizing caffeine to improve hydration and urine output. Follow up with Dr. Earnest Bailey on 5/15. Patient seen with Melynda Ripple, PharmD. Candidate and Janace Litten, PharmD, Pharmacy Resident.

## 2011-11-04 NOTE — Progress Notes (Signed)
Teresa [name] is a 51 y.o. female who presents to [facility] today for question of a spider bite on her left forearm.  Patient woke with a painful red bump on her left forearm 2 days ago. She noted that it initially had extending swelling tenderness and redness.  This has reduced with hydrocortisone cream.  She denies any fevers chills difficulty moving her arm or significant pain.   PMH: Reviewed patient has hypertension History  Substance Use Topics  . Smoking status: Former Smoker -- 0.0 packs/day    Types: Cigarettes    Quit date: 10/25/2011  . Smokeless tobacco: Never Used   Comment: Quit date planned for October 25, 2011. Been smoking for over 20 years.  . Alcohol Use: No   ROS as above  Medications reviewed. Current Outpatient Prescriptions  Medication Sig Dispense Refill  . amLODipine (NORVASC) 10 MG tablet Take 1 tablet (10 mg total) by mouth at bedtime.  90 tablet  3  . Calcium-Magnesium-Zinc 500-250-12.5 MG TABS Take 1 tablet by mouth daily.       . carvedilol (COREG) 12.5 MG tablet Take 1 tablet (12.5 mg total) by mouth 2 (two) times daily with a meal.  180 tablet  3  . levothyroxine (SYNTHROID, LEVOTHROID) 100 MCG tablet Take 100 mcg by mouth daily.      Marland Kitchen lisinopril-hydrochlorothiazide (PRINZIDE,ZESTORETIC) 20-25 MG per tablet Take 1 tablet by mouth daily.  90 tablet  3  . Multiple Vitamins-Minerals (MULTIVITAMIN WITH MINERALS) tablet Take 1 tablet by mouth daily.      Marland Kitchen spironolactone (ALDACTONE) 25 MG tablet Take 1 tablet (25 mg total) by mouth daily.  90 tablet  3  . DISCONTD: levothyroxine (SYNTHROID, LEVOTHROID) 100 MCG tablet Take 0.5 tablets (50 mcg total) by mouth daily.  30 tablet  1    Exam:  BP 159/92  Pulse 62  Temp(Src) 98.8 F (37.1 C) (Oral)  Ht 5\' 5"  (1.651 m)  Wt 183 lb 9.6 oz (83.28 kg)  BMI 30.55 kg/m2 Gen: Well NAD SKIN: Small erythematous papule on left forearm the size of a dime with extending induration without erythema approximately 2 cm in diameter.   Normal hand motion and strength.  Mildly tender papule  No results found for this or any previous visit (from the past 72 hour(s)).

## 2011-11-04 NOTE — Assessment & Plan Note (Signed)
Smoker of > 20 years who is a good candidate for smoking cessation based on her quit date of 10/25/11 and doing well since then, only using 1 cigarette since this time. She uses minimal nicotine replacement therapy (lozenges) for cravings and during stressful times. She is aware and proactive at avoiding her triggers which include playing bingo, and working at the computer. Plan is to encourage continued smoking cessation and avoiding triggers. Continue to use nicotine replacement therapy as necessary. Will follow up in 1 month to evaluate progress. Patient seen with Melynda Ripple, PharmD. Candidate and Janace Litten, PharmD, Pharmacy Resident.

## 2011-11-04 NOTE — Progress Notes (Signed)
  Subjective:    Patient ID: Teresa [name], female    DOB: 07/08/1960, 51 y.o.   MRN: [id number]  HPI 51 yo Female who returns to clinic today for follow up to smoking cessation and hypertension. Pt reports left arm redness/itching in the last 24 hours that appears could be a "spider bite." This was evaluated and seen by Dr. Denyse Amass. The swelling and redness has decreased significantly in the last 24hrs. She set a quit date for smoking on 4/30 and has done well since then. Only having 1 cigarette due to traveling out of town. She has begun exercising with pilates class this week and plans to continue. She has been restarted on a blood pressure regimen with: lisinopril/HCTZ, spironolactone, carvedilol, and amlodipine. She has been complaining of GI side effects (diarrhea, upset stomach) since starting the carvedilol.    Review of Systems     Objective:   Physical Exam        Assessment & Plan:  .tobn

## 2011-11-04 NOTE — Patient Instructions (Signed)
Thank you for coming in today.  I think you may have been bitten.  Please put antibiotic ointment on your arm. If it starts itching again takes Zyrtec and apply "[place] itch" Go to urgent care or the emergency room if it gets very red and painful or you have a fever. That is a sign of infection.  Spider Bite Spider bites are not common. Most spider bites do not cause serious problems. The elderly, very young children, and people with certain existing medical conditions are more likely to experience significant symptoms. SYMPTOMS   Spider bites may not cause any pain at first. Within 1 or 2 days of the bite, there may be swelling, redness, and pain in the bite area. However, some spider bites can cause pain within the first hour. TREATMENT   Your caregiver may prescribe antibiotic medicine if a bacterial infection develops in the bite. However, not all spider bites require antibiotics or prescription medicines.   HOME CARE INSTRUCTIONS  Do not scratch the bite area.   Keep the bite area clean and dry. Wash the area with soap and water as directed.   Put ice or cool compresses on the bite area.   Put ice in a plastic bag.   Place a towel between your skin and the bag.   Leave the ice on for 20 minutes, 4 times a day for the first 2 to 3 days, or as directed.   Keep the bite area elevated above the level of your heart. This helps reduce redness and swelling.   Only take over-the-counter or prescription medicines as directed by your caregiver.   If you are given antibiotics, take them as directed. Finish them even if you start to feel better.  You may need a tetanus shot if:  You cannot remember when you had your last tetanus shot.   You have never had a tetanus shot.   The injury broke your skin.  If you get a tetanus shot, your arm may swell, get red, and feel warm to the touch. This is common and not a problem. If you need a tetanus shot and you choose not to have one, there  is a rare chance of getting tetanus. Sickness from tetanus can be serious. SEEK MEDICAL CARE IF: Your bite is not better after 3 days of treatment. SEEK IMMEDIATE MEDICAL CARE IF:  Your bite turns purple or develops increased swelling, pain, or redness.   You develop shortness of breath or chest pain.   You have muscle cramps or painful muscle spasms.   You develop abdominal pain, nausea, or vomiting.   You feel unusually tired or sleepy.  MAKE SURE YOU:  Understand these instructions.   Will watch your condition.   Will get help right away if you are not doing well or get worse.  Document Released: 07/21/2004 Document Revised: 06/02/2011 Document Reviewed: 01/12/2011 [facility] Patient Information 2012 [place], [place].

## 2011-11-04 NOTE — Progress Notes (Signed)
  Subjective:    Patient ID: Teresa [name], female    DOB: 1960/10/08, 51 y.o.   MRN: [id number]  HPI 51 yo Female who returns to clinic today for follow up on smoking cessation and blood pressure management. She also was seen by Dr. Denyse Amass today for a potential spider-bite on her left forearm (that is improving). She set a quit date to stop smoking on 10/25/11. States she has had 1 cigarette since this time, when she went out of town for a day. She states that she has control over her triggers and understands that going out to play bingo and working on the computer were her main triggers. Since last visit she has been restarted on a blood pressure regimen with: lisinopril/HCTZ, amlodipine, spironolactone, and carvedilol. Since starting the carvedilol she has been experiencing GI effects (diarrhea, upset stomach). She also complains of swelling in her feet as well as decreased urination during the day.   Review of Systems     Objective:   Physical Exam 0-2 pitting edema      Assessment & Plan:  Tobacco Smoker of > 20 years who is a good candidate for smoking cessation based on her quit date of 10/25/11 and doing well since then, only using 1 cigarette since this time. She uses minimal nicotine replacement therapy (lozenges) for cravings and during stressful times. She is aware and proactive at avoiding her triggers which include playing bingo, and working at the computer. Plan is to encourage continued smoking cessation and avoiding triggers. Continue to use nicotine replacement therapy as necessary. Will follow up in 1 month to evaluate progress. Patient seen with Melynda Ripple, PharmD. Candidate and Janace Litten, PharmD, Pharmacy Resident.    Hypertension Patient has been started back on blood pressure regimen and is tolerating the regimen. Blood pressure today is 145/90 (not at goal) but is improving. She tolerated restarting her medications but began feeling GI effects potentially from  carvedilol. Plan to stop carvedilol now and change to Metoprolol 12.5 mg bid starting 11/07/11. Patient mentioning edema in lower extremities. This is could be from amlodipine as a potential cause, but swelling/edema is not a concern at this time and appears to potentially be due to long hours on her feet at work. Discussed wearing compression stockings at work to increase flow and periodic leg exercises. Discussed calling back if swelling worsens/unable to put shoes on. May consider decreasing or stopping amlodipine. Will hold off on this for now especially with starting different beta blocker. Patient to begin exercising at pilates. Counseled patient on drinking more water and minimizing caffeine to improve hydration and urine output. Follow up with Dr. Earnest Bailey on 5/15. Patient seen with Melynda Ripple, PharmD. Candidate and Janace Litten, PharmD, Pharmacy Resident.

## 2011-11-07 ENCOUNTER — Telehealth: Payer: Self-pay | Admitting: Family Medicine

## 2011-11-07 MED ORDER — LEVOTHYROXINE SODIUM 100 MCG PO TABS: 100.0000 ug | ORAL_TABLET | Freq: Every day | ORAL | Status: DC

## 2011-11-07 NOTE — Telephone Encounter (Signed)
lvm for pt informing that Rx sent to pharmacy.Teresa [name]'

## 2011-11-07 NOTE — Telephone Encounter (Signed)
90 day supply sent to pharmacy

## 2011-11-07 NOTE — Telephone Encounter (Signed)
Pt stated that her synthroid was to be changed to 100 mcg and the pharmacy has not received the new Rx for this as of yet, also pt is asking if this can be a 90 Rx instead of 1 month. She is using [facility] on [address]. Pt is asking that if this cannot be done as a 3 month supply to please inform her.Loralee Pacas Holden

## 2011-11-07 NOTE — Progress Notes (Signed)
Patient ID: Teresa [name], female   DOB: February 06, 1961, 51 y.o.   MRN: [id number] Reviewed and agree with agree with Dr. Macky Lower management.

## 2011-11-09 ENCOUNTER — Ambulatory Visit (INDEPENDENT_AMBULATORY_CARE_PROVIDER_SITE_OTHER): Payer: Self-pay | Admitting: Family Medicine

## 2011-11-09 ENCOUNTER — Encounter: Payer: Self-pay | Admitting: Family Medicine

## 2011-11-09 VITALS — BP 154/82 | HR 60 | Temp 98.7°F | Ht 65.0 in | Wt 185.2 lb

## 2011-11-09 DIAGNOSIS — T63391A Toxic effect of venom of other spider, accidental (unintentional), initial encounter: Secondary | ICD-10-CM

## 2011-11-09 DIAGNOSIS — T63301A Toxic effect of unspecified spider venom, accidental (unintentional), initial encounter: Secondary | ICD-10-CM

## 2011-11-09 DIAGNOSIS — R609 Edema, unspecified: Secondary | ICD-10-CM | POA: Insufficient documentation

## 2011-11-09 DIAGNOSIS — T6391XA Toxic effect of contact with unspecified venomous animal, accidental (unintentional), initial encounter: Secondary | ICD-10-CM

## 2011-11-09 DIAGNOSIS — N644 Mastodynia: Secondary | ICD-10-CM

## 2011-11-09 MED ORDER — LEVOTHYROXINE SODIUM 100 MCG PO TABS: 100.0000 ug | ORAL_TABLET | Freq: Every day | ORAL | Status: DC

## 2011-11-09 NOTE — Assessment & Plan Note (Signed)
Area has resolved with only   Mild postinflammatory hyperpigmentation.

## 2011-11-09 NOTE — Progress Notes (Signed)
  Subjective:    Patient ID: Teresa [name], female    DOB: [birth year], 51 y.o.   MRN: [id number]  HPI Here to discuss foot swelling and breast tenderness  Foot swelling, bilateral, right >L intermittent for years, feel it has worsened since starting blood pressure medicines.  Did not make any of the changes discussed at pharmacy clinic.  Also notes 1 day of pain on top of foot.  Requests a letter for her workplace stating she needs to be able to wear supportive shoes.  Breast pain:  Bilateral breast pain x 2 weeks.  No skin changes, masses, nipple discharge.  Last mammogram 1.5 years ago per patient, normal.  HTN:  BP elevated today.  States she has not stated to take lopressor yet.  She does not want to talk about her blood pressure at this visit and instead desires to keep appointment with pharmacy clinic.  I have reviewed patient's  PMH, FH, and Social history and Medications as related to this visit.  Review of Systemssee HPI     Objective:   Physical Exam  GEN: Alert & Oriented, No acute distress CV:  Regular Rate & Rhythm, no murmur Respiratory:  Normal work of breathing, CTAB Abd:  + BS, soft, no tenderness to palpation Ext: no pre-tibial edema.  Right foot with dorsal foot pain, no swelling, erythema. Breast:  Tender throughout.  No erythema, nodules, skin changes, nipple discharge.       Assessment & Plan:

## 2011-11-09 NOTE — Assessment & Plan Note (Signed)
Bilateral breast pain, non focal, no abnormalities noted on exam.  Possibly due to initiation of spironolactone.  Advised tylenol/motrin for pain.  She has many intolerances to medications and will try to work past some of these that may be clinically mild.  If continues may need to d/c spironolactone.  Patient filled out form for free breast cancer screening today.

## 2011-11-09 NOTE — Assessment & Plan Note (Signed)
Mild dependent edema after a long day on her feet.  Not significant on exam today.  Agreed to write letter stating she may wear supportive shoes at work. Urged her to consider knee high support stockings and dietary considerations as discussed with Dr. Raymondo Band.  Not concerned for need to decrease amlodipine at this time.

## 2011-11-09 NOTE — Patient Instructions (Signed)
I sent refill for thyroid medicine to your [facility]  Buy some knee high compression stockings to help with swelling and leg fatigue  Use ibuprofen and tylenol for breast pain- will see if improves once your body gets used to medicines  See information to schedule a mammogram  Dont forget to get your thyroid level checked after you have been taking new dose for 6 weeks

## 2011-11-11 ENCOUNTER — Other Ambulatory Visit: Payer: Self-pay | Admitting: Family Medicine

## 2011-11-11 DIAGNOSIS — Z1231 Encounter for screening mammogram for malignant neoplasm of breast: Secondary | ICD-10-CM

## 2011-11-15 ENCOUNTER — Ambulatory Visit (HOSPITAL_COMMUNITY)
Admission: RE | Admit: 2011-11-15 | Discharge: 2011-11-15 | Disposition: A | Discharge status: Home / Self Care | Payer: Self-pay | Source: Ambulatory Visit | Attending: Family Medicine | Admitting: Family Medicine

## 2011-11-15 DIAGNOSIS — Z1231 Encounter for screening mammogram for malignant neoplasm of breast: Secondary | ICD-10-CM

## 2011-12-06 ENCOUNTER — Ambulatory Visit: Payer: Self-pay | Admitting: Pharmacist

## 2011-12-09 ENCOUNTER — Encounter: Payer: Self-pay | Admitting: Pharmacist

## 2011-12-09 ENCOUNTER — Ambulatory Visit (INDEPENDENT_AMBULATORY_CARE_PROVIDER_SITE_OTHER): Payer: Self-pay | Admitting: Pharmacist

## 2011-12-09 VITALS — BP 133/84 | HR 58 | Ht 65.5 in | Wt 183.6 lb

## 2011-12-09 DIAGNOSIS — F17200 Nicotine dependence, unspecified, uncomplicated: Secondary | ICD-10-CM

## 2011-12-09 DIAGNOSIS — Z720 Tobacco use: Secondary | ICD-10-CM

## 2011-12-09 DIAGNOSIS — R609 Edema, unspecified: Secondary | ICD-10-CM

## 2011-12-09 DIAGNOSIS — I10 Essential (primary) hypertension: Secondary | ICD-10-CM

## 2011-12-09 NOTE — Patient Instructions (Addendum)
Great to see you today! STAY quit from smoking.   Do some go active things to keep your weight at goal.  Next visit with Dr. Earnest Bailey in late July

## 2011-12-09 NOTE — Progress Notes (Signed)
  Subjective:    Patient ID: Teresa [name], female    DOB: 1961/06/10, 51 y.o.   MRN: [id number]  HPI Patient arrives in good spirits.  She is chewing on a lollypop stick.   She states she has had two slips of a "few drags" on a cigarette (from her son) during stress over the last week.   She denies smoking other than these episodes of stress.     She shared that she believes her stress level will decrease when her 26yo son moves out from her house within the next month.      He states adherence with all blood pressure medications as prescribed AND denies ANY intolerances with current regimen.  She also states that she has been able to afford this regimen.   Self-established weight goal per patient is 165 lbs.    Review of Systems     Objective:   Physical Exam  Filed Vitals:   12/09/11 0942  Height: 5' 5.75" (1.67 m)  Weight: 183 lb 9.6 oz (83.28 kg)        Assessment & Plan:   Chronic hypertension current under improved control with multiple drug regimen that the patient is able to afford.   Encouraged more activity and to attempt stress reduction and weight loss through increase in exercise.   Patient is willing to restart scheduled exercise regimen.   If BP remains elevated above 140 Systolic in Right Arm at next visit, consider increase in spironolactone as next time.   Hx of moderate Nicotine Dependence in a patient who abstained from smoking cigarettes for > 1 month with two slips of smoking a few puffs this past week.   She remains an excellent candidate for long-term success b/c of current level of motivation AND potential for reduction in stress and availability of cigarettes with her son moving out in the the next 30 days.   Patient continues to use 1 800-QUIT NOW support program.  F/U Clinic Visit with Dr. Earnest Bailey   Total time in face-to-face counseling 20 minutes.

## 2011-12-09 NOTE — Assessment & Plan Note (Signed)
Chronic hypertension current under improved control with multiple drug regimen that the patient is able to afford.   Encouraged more activity and to attempt stress reduction and weight loss through increase in exercise.   Patient is willing to restart scheduled exercise regimen.   If BP remains elevated above 140 Systolic in Right Arm at next visit, consider increase in spironolactone as next time.

## 2011-12-09 NOTE — Assessment & Plan Note (Signed)
Denies edema with use of amlodipine at this time.  Has been able to tolerate amlodipine without edema.

## 2011-12-09 NOTE — Progress Notes (Signed)
Patient ID: Teresa [name], female   DOB: 1960/12/24, 51 y.o.   MRN: [id number] Reviewed and agree with Dr. Macky Lower management.

## 2011-12-09 NOTE — Assessment & Plan Note (Signed)
Hx of moderate Nicotine Dependence in a patient who abstained from smoking cigarettes for > 1 month with two slips of smoking a few puffs this past week.   She remains an excellent candidate for long-term success b/c of current level of motivation AND potential for reduction in stress and availability of cigarettes with her son moving out in the the next 30 days.   Patient continues to use 1 800-QUIT NOW support program.  F/U Clinic Visit with Dr. Earnest Bailey   Total time in face-to-face counseling 20 minutes.

## 2012-01-19 ENCOUNTER — Ambulatory Visit (INDEPENDENT_AMBULATORY_CARE_PROVIDER_SITE_OTHER): Payer: Self-pay | Admitting: Family Medicine

## 2012-01-19 ENCOUNTER — Encounter: Payer: Self-pay | Admitting: Family Medicine

## 2012-01-19 VITALS — BP 158/103 | HR 63 | Temp 98.6°F | Ht 65.5 in | Wt 186.8 lb

## 2012-01-19 DIAGNOSIS — F17200 Nicotine dependence, unspecified, uncomplicated: Secondary | ICD-10-CM

## 2012-01-19 DIAGNOSIS — R070 Pain in throat: Secondary | ICD-10-CM

## 2012-01-19 DIAGNOSIS — E039 Hypothyroidism, unspecified: Secondary | ICD-10-CM

## 2012-01-19 DIAGNOSIS — Z720 Tobacco use: Secondary | ICD-10-CM

## 2012-01-19 DIAGNOSIS — N390 Urinary tract infection, site not specified: Secondary | ICD-10-CM

## 2012-01-19 DIAGNOSIS — R300 Dysuria: Secondary | ICD-10-CM

## 2012-01-19 DIAGNOSIS — I10 Essential (primary) hypertension: Secondary | ICD-10-CM

## 2012-01-19 LAB — POCT URINALYSIS DIPSTICK
Bilirubin, UA: NEGATIVE
Glucose, UA: NEGATIVE
Ketones, UA: NEGATIVE
Nitrite, UA: NEGATIVE
Protein, UA: NEGATIVE
Spec Grav, UA: 1.015
Urobilinogen, UA: 0.2
pH, UA: 7

## 2012-01-19 LAB — POCT UA - MICROSCOPIC ONLY: WBC, Ur, HPF, POC: >20

## 2012-01-19 MED ORDER — SPIRONOLACTONE 25 MG PO TABS: 50.0000 mg | ORAL_TABLET | Freq: Every day | ORAL | Status: DC

## 2012-01-19 MED ORDER — SPIRONOLACTONE 50 MG PO TABS: 50.0000 mg | ORAL_TABLET | Freq: Every day | ORAL | Status: DC

## 2012-01-19 MED ORDER — CEPHALEXIN 500 MG PO CAPS: 500.0000 mg | ORAL_CAPSULE | Freq: Two times a day (BID) | ORAL | Status: AC

## 2012-01-19 NOTE — Patient Instructions (Addendum)
Come back in 1 week for fasting labwork (nothing but water and medicines for 8 hours)  Make follow-up appt in 2 weeks  Increase your spironolactone from 25 mg to 50 mg a day

## 2012-01-19 NOTE — Progress Notes (Signed)
  Subjective:    Patient ID: Teresa [name], female    DOB: 08/12/1960, 51 y.o.   MRN: [id number]  HPI Itchy throat:  Having to clear throat due to itching throat.  Started since she has started back smoking.  States she was tempted from being exposed to smells from smoking from her son.  Son has now moved out.  Feels she has a supportive home to quit on her own.  HYPERTENSION  BP Readings from Last 3 Encounters:  01/19/12 158/103  12/09/11 133/84  11/09/11 154/82    Hypertension ROS: taking medications as instructed, no medication side effects noted, no chest pain on exertion, no dyspnea on exertion, no swelling of ankles and no intermittent claudication symptoms.  States has been taking meds off schedule, but has taken them in the past few days.  Dysuria:  Mild burnign with urination.  Intermittent for the past 2 weeks.  She associates this with drinking soda.  Had some frequency, but now resolved.   Wt Readings from Last 3 Encounters:  01/19/12 186 lb 12.8 oz (84.732 kg)  12/09/11 183 lb 9.6 oz (83.28 kg)  11/09/11 185 lb 3.2 oz (84.006 kg)       Review of Systems See HPI    Objective:   Physical Exam GEN: Alert & Oriented, No acute distress HEENT: [place]/AT. EOMI, PERRLA, no conjunctival injection or scleral icterus.  Bilateral tympanic membranes intact, left tm ? Prominent ossicle vs retraction no erythema  .  Nares without edema or rhinorrhea.  Oropharynx is without erythema or exudates.  No anterior or posterior cervical lymphadenopathy. CV:  Regular Rate & Rhythm, no murmur Respiratory:  Normal work of breathing, CTAB Abd:  + BS, soft, no tenderness to palpation Ext: no pre-tibial edema        Assessment & Plan:

## 2012-01-20 ENCOUNTER — Telehealth: Payer: Self-pay | Admitting: *Deleted

## 2012-01-20 DIAGNOSIS — N390 Urinary tract infection, site not specified: Secondary | ICD-10-CM | POA: Insufficient documentation

## 2012-01-20 NOTE — Assessment & Plan Note (Signed)
Will increase spironolcatone, follow-up labs in 1 week, follow-up bp in 2 weeks

## 2012-01-20 NOTE — Telephone Encounter (Signed)
Message copied by Farrell Ours on Fri Jan 20, 2012  5:36 PM ------      Message from: Macy Mis      Created: Fri Jan 20, 2012  2:51 PM       Please call and let her know i sent in rx for keflex for urinary tract infection

## 2012-01-20 NOTE — Assessment & Plan Note (Signed)
Low physiologic nicotine dependence at this point.  She feels she has the behavioral tools she needs to quit at this time

## 2012-01-20 NOTE — Assessment & Plan Note (Signed)
Will rx keflex 

## 2012-01-20 NOTE — Assessment & Plan Note (Signed)
Now with itchiness, since restarting smoking. Likely irritant.  No indicated fir further evaluation at this time.  Will continue to quit smoking, advised to let me know if continues so we can evaluate for need for further intervention.

## 2012-01-20 NOTE — Telephone Encounter (Signed)
LM informing patient of below

## 2012-01-20 NOTE — Assessment & Plan Note (Signed)
Did not come for follow-up repeat TSh after was found to be 11 in April and dose was increased.  Will check when she comes back for fasting labwork next week.

## 2012-01-26 ENCOUNTER — Other Ambulatory Visit: Payer: Self-pay

## 2012-01-26 DIAGNOSIS — I10 Essential (primary) hypertension: Secondary | ICD-10-CM

## 2012-01-26 DIAGNOSIS — E039 Hypothyroidism, unspecified: Secondary | ICD-10-CM

## 2012-01-26 LAB — COMPREHENSIVE METABOLIC PANEL
ALT: 23 U/L (ref 0–35)
AST: 17 U/L (ref 0–37)
Albumin: 4.3 g/dL (ref 3.5–5.2)
Alkaline Phosphatase: 56 U/L (ref 39–117)
BUN: 14 mg/dL (ref 6–23)
CO2: 27 mEq/L (ref 19–32)
Calcium: 10 mg/dL (ref 8.4–10.5)
Chloride: 104 mEq/L (ref 96–112)
Creat: 0.89 mg/dL (ref 0.50–1.10)
Glucose, Bld: 93 mg/dL (ref 70–99)
Potassium: 4 mEq/L (ref 3.5–5.3)
Sodium: 137 mEq/L (ref 135–145)
Total Bilirubin: 0.5 mg/dL (ref 0.3–1.2)
Total Protein: 6.9 g/dL (ref 6.0–8.3)

## 2012-01-26 LAB — TSH: TSH: 2.61 u[IU]/mL (ref 0.350–4.500)

## 2012-01-26 LAB — LIPID PANEL
Cholesterol: 157 mg/dL (ref 0–200)
HDL: 38 mg/dL — ABNORMAL LOW (ref >39)
LDL Cholesterol: 109 mg/dL — ABNORMAL HIGH (ref 0–99)
Total CHOL/HDL Ratio: 4.1 Ratio
Triglycerides: 50 mg/dL (ref <150)
VLDL: 10 mg/dL (ref 0–40)

## 2012-01-26 NOTE — Progress Notes (Signed)
CMP,FLP AND TSH DONE TODAY Teresa [name] 

## 2012-01-27 ENCOUNTER — Encounter: Payer: Self-pay | Admitting: Family Medicine

## 2012-02-06 ENCOUNTER — Ambulatory Visit (INDEPENDENT_AMBULATORY_CARE_PROVIDER_SITE_OTHER): Payer: Self-pay | Admitting: Family Medicine

## 2012-02-06 ENCOUNTER — Encounter: Payer: Self-pay | Admitting: Family Medicine

## 2012-02-06 VITALS — BP 129/84 | HR 64 | Temp 98.3°F | Ht 65.5 in | Wt 183.0 lb

## 2012-02-06 DIAGNOSIS — I10 Essential (primary) hypertension: Secondary | ICD-10-CM

## 2012-02-06 DIAGNOSIS — R070 Pain in throat: Secondary | ICD-10-CM

## 2012-02-06 DIAGNOSIS — K219 Gastro-esophageal reflux disease without esophagitis: Secondary | ICD-10-CM

## 2012-02-06 DIAGNOSIS — M542 Cervicalgia: Secondary | ICD-10-CM

## 2012-02-06 DIAGNOSIS — S161XXA Strain of muscle, fascia and tendon at neck level, initial encounter: Secondary | ICD-10-CM | POA: Insufficient documentation

## 2012-02-06 HISTORY — DX: Strain of muscle, fascia and tendon at neck level, initial encounter: S16.1XXA

## 2012-02-06 MED ORDER — OMEPRAZOLE 20 MG PO CPDR: 20.0000 mg | DELAYED_RELEASE_CAPSULE | Freq: Every day | ORAL | Status: DC

## 2012-02-06 NOTE — Patient Instructions (Addendum)
Acid reducer medicine- omeprazole  Will refer you for physical therapy, call office if you dont hear about appointment in 1 week  Congratulations on  your blood pressure  If you have chest pain while walking or new symptoms, please let me know  Gastroesophageal Reflux Disease, Adult Gastroesophageal reflux disease (GERD) happens when acid from your stomach goes into your food pipe (esophagus). The acid can cause a burning feeling in your chest. Over time, the acid can make small holes (ulcers) in your food pipe.   HOME CARE  Ask your doctor for advice about:   Losing weight.   Quitting smoking.   Alcohol use.   Avoid foods and drinks that make your problems worse. You may want to avoid:   Caffeine and alcohol.   Chocolate.   Mints.   Garlic and onions.   Spicy foods.   Citrus fruits, such as oranges, lemons, or limes.   Foods that contain tomato, such as sauce, chili, salsa, and pizza.   Fried and fatty foods.   Avoid lying down for 3 hours before you go to bed or before you take a nap.   Eat small meals often, instead of large meals.   Wear loose-fitting clothing. Do not wear anything tight around your waist.   Raise (elevate) the head of your bed 6 to 8 inches with wood blocks. Using extra pillows does not help.   Only take medicines as told by your doctor.   Do not take aspirin or ibuprofen.  GET HELP RIGHT AWAY IF:    You have pain in your arms, neck, jaw, teeth, or back.   Your pain gets worse or changes.   You feel sick to your stomach (nauseous), throw up (vomit), or sweat (diaphoresis).   You feel short of breath, or you pass out (faint).   Your throw up is green, yellow, black, or looks like coffee grounds or blood.   Your poop (stool) is red, bloody, or black.  MAKE SURE YOU:    Understand these instructions.   Will watch your condition.   Will get help right away if you are not doing well or get worse.  Document Released: 11/30/2007  Document Revised: 06/02/2011 Document Reviewed: 12/31/2010 [facility] Patient Information 2012 [place], [place].

## 2012-02-06 NOTE — Assessment & Plan Note (Signed)
Improved.  Continue current regimen

## 2012-02-06 NOTE — Progress Notes (Signed)
  Subjective:    Patient ID: Teresa [name], female    DOB: 03-04-1961, 51 y.o.   MRN: [id number]  HPIFollow-up appt:  HTN: at last visit increased spironolactone.  Tolerating well.  BP normal..  Reports good compliance with meds, no side effects.  Continued ear pain pulling into her throat.  Chronic.  Has tried antibiotics and cough medications in the past without improvement.  Feels a pressure like a ball in the back of her nexk at times.  2-3 weeks of chest burning: Wakes up in the middle of the night with chest burning- feels like heartburn per patient.  Makes herself regurgitate to help.   Triggered by eating salads.  Took zantac which helped.  Started back walking- walking 2.5 miles per day.  No chest pain with walking or dyspnea.  Also has pains in leftneck and arms, wakes her up at night.  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   Tobacco abuse- smoking 1-2 cigarettes per week.  Review of Systems See HPI    Objective:   Physical Exam GEN: Alert & Oriented, No acute distress HEENT: Bilateral tympanic membranes intact without erythema or effusion.    Oropharynx is without erythema or exudates.  No anterior or posterior cervical lymphadenopathy. CV:  Regular Rate & Rhythm, no murmur Respiratory:  Normal work of breathing, CTAB Abd:  + BS, soft, no tenderness to palpation Ext: no pre-tibial edema MSK:  Area of pain on left trapezius consistent with muscle spasm.  Full rom of neck.       Assessment & Plan:

## 2012-02-06 NOTE — Assessment & Plan Note (Signed)
Throat pain today sounds like left trapezius MSK pain.  No evidence of acute infection.  No mass on exam or imaging.

## 2012-02-06 NOTE — Assessment & Plan Note (Signed)
Will rx omeprazole for GERD symptoms.  Discussed that her symptoms are atypical for chest pain and is reassuring that she has started a new walking program and has no symptoms of chest pain or dyspnea when walking.  Discussed red flags for re-eveluation.

## 2012-02-06 NOTE — Assessment & Plan Note (Signed)
Pain MSK in origin.  Will refer to physical therapy

## 2012-02-09 ENCOUNTER — Ambulatory Visit: Payer: Self-pay | Attending: Family Medicine

## 2012-02-09 ENCOUNTER — Other Ambulatory Visit: Payer: Self-pay | Admitting: Family Medicine

## 2012-02-09 DIAGNOSIS — IMO0001 Reserved for inherently not codable concepts without codable children: Secondary | ICD-10-CM | POA: Insufficient documentation

## 2012-02-09 DIAGNOSIS — M2569 Stiffness of other specified joint, not elsewhere classified: Secondary | ICD-10-CM | POA: Insufficient documentation

## 2012-02-09 DIAGNOSIS — M2550 Pain in unspecified joint: Secondary | ICD-10-CM | POA: Insufficient documentation

## 2012-02-16 ENCOUNTER — Ambulatory Visit: Payer: Self-pay | Admitting: Physical Therapy

## 2012-02-17 ENCOUNTER — Ambulatory Visit: Payer: Self-pay

## 2012-02-22 ENCOUNTER — Ambulatory Visit: Payer: Self-pay | Admitting: Physical Therapy

## 2012-02-24 ENCOUNTER — Ambulatory Visit: Payer: Self-pay

## 2012-03-06 ENCOUNTER — Ambulatory Visit: Payer: Self-pay | Admitting: Family Medicine

## 2012-03-12 ENCOUNTER — Encounter: Payer: Self-pay | Admitting: Physical Therapy

## 2012-03-15 ENCOUNTER — Encounter: Payer: Self-pay | Admitting: Physical Therapy

## 2012-03-20 ENCOUNTER — Encounter: Payer: Self-pay | Admitting: Physical Therapy

## 2012-03-21 ENCOUNTER — Other Ambulatory Visit: Payer: Self-pay | Admitting: Family Medicine

## 2012-03-21 ENCOUNTER — Encounter: Payer: Self-pay | Admitting: Family Medicine

## 2012-03-23 ENCOUNTER — Emergency Department (HOSPITAL_COMMUNITY)
Admission: EM | Admit: 2012-03-23 | Discharge: 2012-03-23 | Disposition: A | Discharge status: Home / Self Care | Payer: Self-pay | Attending: Emergency Medicine | Admitting: Emergency Medicine

## 2012-03-23 ENCOUNTER — Encounter (HOSPITAL_COMMUNITY): Payer: Self-pay | Admitting: Family Medicine

## 2012-03-23 ENCOUNTER — Emergency Department (INDEPENDENT_AMBULATORY_CARE_PROVIDER_SITE_OTHER)
Admission: EM | Admit: 2012-03-23 | Discharge: 2012-03-23 | Disposition: A | Discharge status: Nursing Home (Includes ICF) | Payer: Self-pay | Source: Home / Self Care | Attending: Family Medicine | Admitting: Family Medicine

## 2012-03-23 ENCOUNTER — Emergency Department (HOSPITAL_COMMUNITY): Payer: Self-pay

## 2012-03-23 ENCOUNTER — Encounter (HOSPITAL_COMMUNITY): Payer: Self-pay | Admitting: *Deleted

## 2012-03-23 DIAGNOSIS — R519 Headache, unspecified: Secondary | ICD-10-CM

## 2012-03-23 DIAGNOSIS — I10 Essential (primary) hypertension: Secondary | ICD-10-CM | POA: Insufficient documentation

## 2012-03-23 DIAGNOSIS — Z9089 Acquired absence of other organs: Secondary | ICD-10-CM | POA: Insufficient documentation

## 2012-03-23 DIAGNOSIS — G8929 Other chronic pain: Secondary | ICD-10-CM | POA: Insufficient documentation

## 2012-03-23 DIAGNOSIS — R51 Headache: Secondary | ICD-10-CM | POA: Insufficient documentation

## 2012-03-23 DIAGNOSIS — Z79899 Other long term (current) drug therapy: Secondary | ICD-10-CM | POA: Insufficient documentation

## 2012-03-23 LAB — BASIC METABOLIC PANEL
BUN: 12 mg/dL (ref 6–23)
CO2: 28 mEq/L (ref 19–32)
Calcium: 10.5 mg/dL (ref 8.4–10.5)
Chloride: 100 mEq/L (ref 96–112)
Creatinine, Ser: 0.93 mg/dL (ref 0.50–1.10)
GFR calc Af Amer: 82 mL/min — ABNORMAL LOW (ref >90)
GFR calc non Af Amer: 70 mL/min — ABNORMAL LOW (ref >90)
Glucose, Bld: 85 mg/dL (ref 70–99)
Potassium: 3.9 mEq/L (ref 3.5–5.1)
Sodium: 139 mEq/L (ref 135–145)

## 2012-03-23 MED ORDER — ONDANSETRON HCL 4 MG/2ML IJ SOLN
4.0000 mg | Freq: Once | INTRAMUSCULAR | Status: AC
  Administered 2012-03-23: 4 mg via INTRAVENOUS
  Filled 2012-03-23: qty 2

## 2012-03-23 MED ORDER — ONDANSETRON 4 MG PO TBDP
ORAL_TABLET | ORAL | Status: AC
  Filled 2012-03-23: qty 1

## 2012-03-23 MED ORDER — KETOROLAC TROMETHAMINE 30 MG/ML IJ SOLN: 30.0000 mg | Freq: Once | INTRAMUSCULAR | Status: DC

## 2012-03-23 MED ORDER — HYDROMORPHONE HCL PF 1 MG/ML IJ SOLN
2.0000 mg | Freq: Once | INTRAMUSCULAR | Status: AC
  Administered 2012-03-23: 2 mg via INTRAMUSCULAR

## 2012-03-23 MED ORDER — KETOROLAC TROMETHAMINE 60 MG/2ML IM SOLN
60.0000 mg | Freq: Once | INTRAMUSCULAR | Status: AC
  Administered 2012-03-23: 60 mg via INTRAMUSCULAR

## 2012-03-23 MED ORDER — HYDROMORPHONE HCL PF 1 MG/ML IJ SOLN
INTRAMUSCULAR | Status: AC
  Filled 2012-03-23: qty 2

## 2012-03-23 MED ORDER — KETOROLAC TROMETHAMINE 60 MG/2ML IM SOLN
INTRAMUSCULAR | Status: AC
  Filled 2012-03-23: qty 2

## 2012-03-23 MED ORDER — HYDRALAZINE HCL 20 MG/ML IJ SOLN
10.0000 mg | Freq: Once | INTRAMUSCULAR | Status: AC
  Administered 2012-03-23: 10 mg via INTRAVENOUS
  Filled 2012-03-23: qty 1

## 2012-03-23 MED ORDER — HYDROMORPHONE HCL PF 1 MG/ML IJ SOLN
0.5000 mg | Freq: Once | INTRAMUSCULAR | Status: AC
  Administered 2012-03-23: 0.5 mg via INTRAVENOUS
  Filled 2012-03-23: qty 1

## 2012-03-23 MED ORDER — HYDROMORPHONE HCL PF 1 MG/ML IJ SOLN
1.0000 mg | Freq: Once | INTRAMUSCULAR | Status: AC
  Administered 2012-03-23: 1 mg via INTRAVENOUS
  Filled 2012-03-23: qty 1

## 2012-03-23 MED ORDER — ONDANSETRON 4 MG PO TBDP
4.0000 mg | ORAL_TABLET | Freq: Once | ORAL | Status: AC
  Administered 2012-03-23: 4 mg via ORAL

## 2012-03-23 NOTE — ED Notes (Signed)
Pt  Reports  Pain mainly  On r  Side  Of head

## 2012-03-23 NOTE — ED Provider Notes (Addendum)
History     CSN: [id number]  Arrival date & time 03/23/12  1135   First MD Initiated Contact with Patient 03/23/12 1153      Chief Complaint  Patient presents with  . Headache    (Consider location/radiation/quality/duration/timing/severity/associated sxs/prior treatment) Patient is a 51 y.o. female presenting with neurologic complaint. The history is provided by the patient.  Neurologic Problem The primary symptoms include headaches. The symptoms began 2 to 6 hours ago. The symptoms are worsening. The neurological symptoms are focal.  The headache is not associated with eye pain.  Associated symptoms comments: Acute lancinating excruciating shooting pains to right auricular face, then radiating to scalp focally...    Past Medical History  Diagnosis Date  . Hypertension   . Chronic back pain   . Anemia   . Migraine headache   . Thyroid disease 2005    [facility]  . Neck strain 02/06/2012    Past Surgical History  Procedure Date  . Cesarean section 1979 1986 1990  . Appendectomy 2006  . Abdominal hysterectomy 2006    Family History  Problem Relation Age of Onset  . Heart disease Other   . Diabetes Other   . Heart disease Mother     CAB age 69  . Diabetes Mother   . Cancer Father 11    colon cancer  . Stroke Father   . Diabetes Sister   . Kidney disease Sister     renal failure  . Diabetes Brother   . Kidney disease Brother     renal failure    History  Substance Use Topics  . Smoking status: Current Some Day Smoker -- 0.1 packs/day    Types: Cigarettes    Last Attempt to Quit: 10/25/2011  . Smokeless tobacco: Never Used   Comment: Quit date planned for October 25, 2011. Been smoking for over 20 years.  . Alcohol Use: No    OB History    Grav Para Term Preterm Abortions TAB SAB Ect Mult Living                  Review of Systems  Constitutional: Negative.   HENT: Positive for ear pain. Negative for neck pain.   Eyes: Negative for pain.    Cardiovascular: Negative for chest pain.  Neurological: Positive for headaches.    Allergies  Clonidine derivatives and Aspirin  Home Medications   Current Outpatient Rx  Name Route Sig Dispense Refill  . AMLODIPINE BESYLATE 10 MG PO TABS Oral Take 1 tablet (10 mg total) by mouth at bedtime. 90 tablet 3  . LEVOTHYROXINE SODIUM 100 MCG PO TABS Oral Take 100 mcg by mouth every morning.    Marland Kitchen LISINOPRIL-HYDROCHLOROTHIAZIDE 20-25 MG PO TABS Oral Take 1 tablet by mouth daily. 90 tablet 3  . METOPROLOL TARTRATE 25 MG PO TABS Oral Take 0.5 tablets (12.5 mg total) by mouth 2 (two) times daily. 180 tablet 3  . OMEPRAZOLE 20 MG PO CPDR Oral Take 1 capsule (20 mg total) by mouth daily. 30 capsule 5  . SPIRONOLACTONE 25 MG PO TABS Oral Take 50 mg by mouth daily.      BP 190/92  Pulse 78  Temp 98.6 F (37 C) (Oral)  Resp 16  SpO2 100%  Physical Exam  Nursing note and vitals reviewed. Constitutional: She is oriented to person, place, and time. She appears well-developed and well-nourished. She appears distressed.  HENT:  Head: Normocephalic.  Right Ear: External ear normal.  Left Ear: External  ear normal.  Mouth/Throat: Oropharynx is clear and moist.  Eyes: Conjunctivae normal are normal. Pupils are equal, round, and reactive to light.  Neck: Normal range of motion. Neck supple.  Lymphadenopathy:    She has no cervical adenopathy.  Neurological: She is alert and oriented to person, place, and time.       Crying from acute right scalp pain.  Skin: Skin is warm and dry.    ED Course  Procedures (including critical care time)  Labs Reviewed - No data to display Ct Head Wo Contrast  03/23/2012  *RADIOLOGY REPORT*  Clinical Data: Stabbing pain in the right occipital area.  CT HEAD WITHOUT CONTRAST  Technique:  Contiguous axial images were obtained from the base of the skull through the vertex without contrast.  Comparison: 05/04/2011.  Findings: No evidence of acute infarct, acute  hemorrhage, mass lesion, mass effect or hydrocephalus.  Visualized portions of the paranasal sinuses show scattered mucosal thickening in the ethmoid air cells.  Mastoid air cells are clear.  IMPRESSION: No acute intracranial abnormality.   Original Report Authenticated By: Reyes Ivan, M.D.      1. Headache disorder       MDM          Linna Hoff, MD 03/23/12 7829  Linna Hoff, MD 03/24/12 [id number]

## 2012-03-23 NOTE — ED Notes (Addendum)
Pt updated on care and plan.

## 2012-03-23 NOTE — ED Notes (Signed)
Pt returned from CT °

## 2012-03-23 NOTE — ED Provider Notes (Signed)
History     CSN: [id number]  Arrival date & time 03/23/12  1242   First MD Initiated Contact with Patient 03/23/12 1808      Chief Complaint  Patient presents with  . Headache    (Consider location/radiation/quality/duration/timing/severity/associated sxs/prior treatment) Patient is a 51 y.o. female presenting with headaches. The history is provided by the patient.  Headache  This is a new problem. The current episode started 6 to 12 hours ago. The problem occurs constantly. The problem has not changed since onset.The headache is associated with nothing. The pain is located in the right unilateral and parietal region. The quality of the pain is described as sharp. The pain is at a severity of 10/10. The pain is severe. The pain does not radiate. Pertinent negatives include no anorexia, no fever, no malaise/fatigue, no chest pressure, no near-syncope, no orthopnea, no palpitations, no syncope, no shortness of breath, no nausea and no vomiting. She has tried nothing (Pt states she did take her BP meds today) for the symptoms. The treatment provided no relief.    Past Medical History  Diagnosis Date  . Hypertension   . Chronic back pain   . Anemia   . Migraine headache   . Thyroid disease 2005    [facility]  . Neck strain 02/06/2012    Past Surgical History  Procedure Date  . Cesarean section 1979 1986 1990  . Appendectomy 2006  . Abdominal hysterectomy 2006    Family History  Problem Relation Age of Onset  . Heart disease Other   . Diabetes Other   . Heart disease Mother     CAB age 31  . Diabetes Mother   . Cancer Father 49    colon cancer  . Stroke Father   . Diabetes Sister   . Kidney disease Sister     renal failure  . Diabetes Brother   . Kidney disease Brother     renal failure    History  Substance Use Topics  . Smoking status: Current Some Day Smoker -- 0.1 packs/day    Types: Cigarettes    Last Attempt to Quit: 10/25/2011  . Smokeless tobacco: Never Used    Comment: Quit date planned for October 25, 2011. Been smoking for over 20 years.  . Alcohol Use: No    OB History    Grav Para Term Preterm Abortions TAB SAB Ect Mult Living                  Review of Systems  Constitutional: Negative for fever and malaise/fatigue.  Respiratory: Negative for cough and shortness of breath.   Cardiovascular: Negative for chest pain, palpitations, orthopnea, syncope and near-syncope.  Gastrointestinal: Negative for nausea, vomiting, abdominal pain, diarrhea and anorexia.  Neurological: Positive for headaches.  All other systems reviewed and are negative.    Allergies  Clonidine derivatives and Aspirin  Home Medications   Current Outpatient Rx  Name Route Sig Dispense Refill  . AMLODIPINE BESYLATE 10 MG PO TABS Oral Take 1 tablet (10 mg total) by mouth at bedtime. 90 tablet 3  . LEVOTHYROXINE SODIUM 100 MCG PO TABS Oral Take 100 mcg by mouth every morning.    Marland Kitchen LISINOPRIL-HYDROCHLOROTHIAZIDE 20-25 MG PO TABS Oral Take 1 tablet by mouth daily. 90 tablet 3  . METOPROLOL TARTRATE 25 MG PO TABS Oral Take 0.5 tablets (12.5 mg total) by mouth 2 (two) times daily. 180 tablet 3  . OMEPRAZOLE 20 MG PO CPDR Oral Take 1 capsule (  20 mg total) by mouth daily. 30 capsule 5  . SPIRONOLACTONE 25 MG PO TABS Oral Take 50 mg by mouth daily.      BP 181/90  Pulse 54  Temp 98.3 F (36.8 C) (Oral)  Resp 12  SpO2 100%  Physical Exam  Nursing note and vitals reviewed. Constitutional: She is oriented to person, place, and time. She appears well-developed and well-nourished. No distress.  HENT:  Head: Normocephalic and atraumatic.       Scarring of bilateral TM  Eyes: EOM are normal. Pupils are equal, round, and reactive to light.  Neck: Normal range of motion.  Cardiovascular: Normal rate and normal heart sounds.   Pulmonary/Chest: Effort normal and breath sounds normal. No respiratory distress.  Abdominal: Soft. She exhibits no distension. There is no  tenderness.  Musculoskeletal: Normal range of motion.  Neurological: She is alert and oriented to person, place, and time. No cranial nerve deficit. She exhibits normal muscle tone. Coordination normal.  Skin: Skin is warm and dry.    ED Course  Procedures (including critical care time)  Labs Reviewed - No data to display Ct Head Wo Contrast  03/23/2012  *RADIOLOGY REPORT*  Clinical Data: Stabbing pain in the right occipital area.  CT HEAD WITHOUT CONTRAST  Technique:  Contiguous axial images were obtained from the base of the skull through the vertex without contrast.  Comparison: 05/04/2011.  Findings: No evidence of acute infarct, acute hemorrhage, mass lesion, mass effect or hydrocephalus.  Visualized portions of the paranasal sinuses show scattered mucosal thickening in the ethmoid air cells.  Mastoid air cells are clear.  IMPRESSION: No acute intracranial abnormality.   Original Report Authenticated By: Reyes Ivan, M.D.      1. Headache       MDM   6:25 PM Pt seen and examined. Pt awoke this morning with a sharp right sided pain in the parietal region. She states this is different from her typical migraines. Pt got some relief with dose of toradol. Pt also with chronic ear infections that have not resolved after three rounds of antibiotics. Concern for possible intercranial process, so will get head CT. Will give pain medicine for headache.  8:09 PM CT head unremarkable. Pt states that recently her BP has been well controlled so these numbers for her are unormally high. Will get EKG and BMP to ensure no end organ damage. Will then treat BP with hydralizine.  10:52 PM Pt now remembering that she sewed her weave on last night and had to scrub real hard in that area and feels that irritation is where the pain is coming from. Pt advised to follow with her doctor to talk about her headache and high BP. BP lower (170s now). BMP unremarkable.     Daleen Bo, MD 03/23/12  [phone]

## 2012-03-23 NOTE — ED Notes (Signed)
Explained wait time and plan of care verbalized understanding.

## 2012-03-23 NOTE — ED Notes (Signed)
Pt sts she woke up this am to intermittent sharp pains on the right side of head. Sent here from [facility]. Pain treated at [facility]

## 2012-03-23 NOTE — ED Notes (Addendum)
Pt   Reports      History      Headache       In  Past       Reports  Symptoms  Of  A  Headache    No  Nausea  No  Vomiting         Symptoms   Began    After  Waking  Up   2  Hours  Ago       She  Is  Crying  In pain            She  Reports   She  Has  Had  Ct  Scan  In  Past  About 1  Yr  Ago   What  She  descibes was  Negative

## 2012-03-23 NOTE — ED Notes (Signed)
Pt admitted today for a headache. Pt rates pain as 4/10. She states that at times she reaches a 10. Pt states that headache started this morning around 0830 and awakened her from sleep. Pt states that pain is stabbing pain R posterior.

## 2012-03-24 NOTE — ED Provider Notes (Addendum)
  I performed a history and physical examination of Teresa [name] and discussed her management with Dr. Aubery Lapping.  I agree with the history, physical, assessment, and plan of care, with the following exceptions: None  O2: 99%ra, normal  Cardiac: 55sr, normal   Date: 03/24/2012  Rate: 54  Rhythm: normal sinus rhythm  QRS Axis: normal  Intervals: normal  ST/T Wave abnormalities: nonspecific T wave changes  Conduction Disutrbances:none  Narrative Interpretation:   Old EKG Reviewed: unchanged BORDERLINE ECG   Ramel Tobon, Elvis Coil, MD 03/24/12 1610  Gerhard Munch, MD 03/24/12 0028

## 2012-06-12 ENCOUNTER — Ambulatory Visit: Payer: Self-pay | Admitting: Family Medicine

## 2012-06-14 ENCOUNTER — Ambulatory Visit: Payer: Self-pay | Admitting: Family Medicine

## 2012-06-21 ENCOUNTER — Ambulatory Visit: Payer: Self-pay | Admitting: Family Medicine

## 2012-06-22 ENCOUNTER — Encounter: Payer: Self-pay | Admitting: Family Medicine

## 2012-06-22 ENCOUNTER — Ambulatory Visit (INDEPENDENT_AMBULATORY_CARE_PROVIDER_SITE_OTHER): Payer: No Typology Code available for payment source | Admitting: Family Medicine

## 2012-06-22 VITALS — BP 174/94 | HR 58 | Temp 98.8°F | Ht 65.0 in | Wt 187.7 lb

## 2012-06-22 DIAGNOSIS — H6690 Otitis media, unspecified, unspecified ear: Secondary | ICD-10-CM | POA: Insufficient documentation

## 2012-06-22 DIAGNOSIS — J029 Acute pharyngitis, unspecified: Secondary | ICD-10-CM

## 2012-06-22 DIAGNOSIS — J069 Acute upper respiratory infection, unspecified: Secondary | ICD-10-CM

## 2012-06-22 LAB — POCT RAPID STREP A (OFFICE): Rapid Strep A Screen: NEGATIVE

## 2012-06-22 MED ORDER — AMOXICILLIN 500 MG PO CAPS: 500.0000 mg | ORAL_CAPSULE | Freq: Two times a day (BID) | ORAL | Status: DC

## 2012-06-22 MED ORDER — BENZONATATE 100 MG PO CAPS: 100.0000 mg | ORAL_CAPSULE | Freq: Two times a day (BID) | ORAL | Status: DC | PRN

## 2012-06-22 NOTE — Assessment & Plan Note (Signed)
Likely 2/2 viral URI, L ear. Appears purulent with some erythema  Amoxicillin 500 BID for 7 days, explained that it may take a few weeks for the pressure to relieve.

## 2012-06-22 NOTE — Progress Notes (Signed)
  Subjective:    Patient ID: Teresa [name], female    DOB: 1960/10/06, 51 y.o.   MRN: [id number]  HPI 51 y/o F here with sore throat, cough, chest pain (similar to chronic GERD), headache, ear pain, and congestion for 5 days No sick contacts Chest pain started last 2 days and is like chronic GERD No wheezing Cough productive of clear mucus Fever subjectively X1 3 days ago, ocasional chills Smoking 3-6 ciggarettes a day, quit but recently started back  Review of Systems Gen: + fever/chils HEENT: As above+ Hoarseness Resp: No SOB, + cough GI: No n/v/d     Objective:   Physical Exam  Gen: NAD, alert, cooperative with exam HEENT: NCAT, EOMI, PERRL, L Ear with thick white material behind it and erythemetous strip. R ear appears normal but view limited by cerumen, No LAD, nares clear, throat erythemetous, Hoarse when speaking CV: RRR, good S1/S2, no murmur Resp: CTABL, no wheezes, non-labored     Assessment & Plan:

## 2012-06-22 NOTE — Patient Instructions (Signed)
Thanks for coming in today, It was nice to meet you  You have an ear infection probably caused by this virus like I explained  I have sent two prescriptions Amoxicillin to treat the infection- finish all of these pills Tessalon pearls for the cough  Seek medical attention right away for difficulty breathing or chest [ain that is unlike your usual heartburn.

## 2012-06-22 NOTE — Assessment & Plan Note (Addendum)
For 5 days, no respiratory distress, afebrile Rapid Strep negative  Fluids and rest as possible Amoxicillin for OM OK to return to work as last fever was 3+ days ago.  Counseled for reasons to RTC

## 2012-06-28 ENCOUNTER — Telehealth: Payer: Self-pay | Admitting: Family Medicine

## 2012-06-28 DIAGNOSIS — B373 Candidiasis of vulva and vagina: Secondary | ICD-10-CM

## 2012-06-28 DIAGNOSIS — B3731 Acute candidiasis of vulva and vagina: Secondary | ICD-10-CM

## 2012-06-28 NOTE — Telephone Encounter (Signed)
Chart reviewed.  Pt was started on amox, so a yeast infection is certainly possible.  If she calls back and is reporting typical yeast infection symptoms (vaginal itching, burning, possibly discharge), then I think it would be reasonable to call in a dose of fluconazole.

## 2012-06-28 NOTE — Telephone Encounter (Signed)
Pt was on abx and now has a yeast inf - wants to know if she can have something called in.   [facility] [place]

## 2012-06-28 NOTE — Telephone Encounter (Signed)
Attempted call back , message left to call me back . Will send  message to preceptor.

## 2012-06-29 MED ORDER — FLUCONAZOLE 150 MG PO TABS: 150.0000 mg | ORAL_TABLET | Freq: Once | ORAL | Status: DC

## 2012-06-29 NOTE — Telephone Encounter (Signed)
Spoke with patient and she reports vaginal itching  And burning, not really discharge . States she always gets this when she takes antibiotic. RX sent to pharmacy as directed by Dr. Swazil[place].Marland Kitchen

## 2012-06-29 NOTE — Telephone Encounter (Signed)
Pt called back and she is asking about meds- I read to her message from Dr [place] and told her that the nurse can have that called in today

## 2012-07-17 ENCOUNTER — Ambulatory Visit (INDEPENDENT_AMBULATORY_CARE_PROVIDER_SITE_OTHER): Payer: No Typology Code available for payment source | Admitting: Family Medicine

## 2012-07-17 ENCOUNTER — Encounter: Payer: Self-pay | Admitting: Family Medicine

## 2012-07-17 VITALS — BP 140/84 | HR 62 | Temp 98.8°F | Ht 65.0 in | Wt 182.3 lb

## 2012-07-17 DIAGNOSIS — H60399 Other infective otitis externa, unspecified ear: Secondary | ICD-10-CM

## 2012-07-17 DIAGNOSIS — I10 Essential (primary) hypertension: Secondary | ICD-10-CM

## 2012-07-17 DIAGNOSIS — Z23 Encounter for immunization: Secondary | ICD-10-CM

## 2012-07-17 DIAGNOSIS — Z1211 Encounter for screening for malignant neoplasm of colon: Secondary | ICD-10-CM

## 2012-07-17 DIAGNOSIS — H6090 Unspecified otitis externa, unspecified ear: Secondary | ICD-10-CM

## 2012-07-17 DIAGNOSIS — R519 Headache, unspecified: Secondary | ICD-10-CM | POA: Insufficient documentation

## 2012-07-17 DIAGNOSIS — R51 Headache: Secondary | ICD-10-CM

## 2012-07-17 DIAGNOSIS — R070 Pain in throat: Secondary | ICD-10-CM

## 2012-07-17 MED ORDER — AMLODIPINE BESYLATE 10 MG PO TABS: 10.0000 mg | ORAL_TABLET | Freq: Every day | ORAL | Status: DC

## 2012-07-17 MED ORDER — CIPROFLOXACIN HCL 0.2 % OT SOLN: 0.2000 mL | Freq: Two times a day (BID) | OTIC | Status: DC

## 2012-07-17 MED ORDER — AMITRIPTYLINE HCL 25 MG PO TABS: 25.0000 mg | ORAL_TABLET | Freq: Every day | ORAL | Status: DC

## 2012-07-17 MED ORDER — NEOMYCIN-POLYMYXIN-HC 3.5-10000-1 OT SOLN: 3.0000 [drp] | Freq: Four times a day (QID) | OTIC | Status: DC

## 2012-07-17 NOTE — Assessment & Plan Note (Signed)
Improved on recheck.  Encouraged her to restart amlodipine- inadvertently left off due to patient misunderstanding

## 2012-07-17 NOTE — Assessment & Plan Note (Signed)
Otitis media improved, now with left sided otitis externa.  Will treat with cirpodex drops

## 2012-07-17 NOTE — Patient Instructions (Addendum)
Due for follow-up ultrasound for thyroid in march  Stop smoking- worsens cough and throat drainage  Ear drops for ear pain  Amitriptyline use at night.  Can cause sleepiness.  Will help with recurrent facial pain/headache  Follow-up in 4-6 weeks

## 2012-07-17 NOTE — Assessment & Plan Note (Signed)
vague chronic symtpoms  X 1 year or more of occipital headache, facial pain, neck pain, with sensation of right sided neck pain but not all at once.  Changes in character- at times throbbing or stabbing.  Otitis externa today, but is chronic, intermittent.  No evidence of temporal artery tenderness on exam, No TMJ or dental pain, no mass felt.  No hearing loss ortinnitus.  Will try amitriptyline 35 mg qhs for what sounds to be neuropathic pain- ? Atypical trigeminal neuralgia.  Follow-up in 3-4 weeks.

## 2012-07-17 NOTE — Progress Notes (Signed)
  Subjective:    Patient ID: Teresa [name], female    DOB: 07/19/1960, 52 y.o.   MRN: [id number]  HPI Here for re-evaluation of left sides ear/neck/head pain  Chronic.  Has normal neck US and Neck CT in March 2013.  Went to Urgent care in Fall 2013 and had normal Head CT.   Had ear infection with effusion in December treated with antibiotics.  Pain is present both days.  Located in left ear and throat, sometimes radiates.  "feel like something is crawling in ear:  Has a draining cough. Headache can be in back of neck or in between eyes or near ear. No hearing change.  Both ears canals also itch.  Past tinnitus but not present any longer.      HYPERTENSION  BP Readings from Last 3 Encounters:  07/17/12 140/84  06/22/12 174/94  03/23/12 188/91    Has not been taking amlodipine       Review of Systems See HPI    Objective:   Physical Exam  GEN: Alert & Oriented, No acute distress HEENT: [place]/AT. EOMI, PERRLA, no conjunctival injection or scleral icterus.  Bilateral tympanic membranes intact without erythema or effusion.  .  Nares without edema or rhinorrhea.  Oropharynx is without erythema or exudates.  No anterior or posterior cervical lymphadenopathy.  CV:  Regular Rate & Rhythm, no murmur Respiratory:  Normal work of breathing, CTAB Abd:  + BS, soft, no tenderness to palpation Ext: no pre-tibial edema       Assessment & Plan:

## 2012-07-18 ENCOUNTER — Other Ambulatory Visit: Payer: Self-pay | Admitting: Family Medicine

## 2012-07-18 DIAGNOSIS — Z1231 Encounter for screening mammogram for malignant neoplasm of breast: Secondary | ICD-10-CM

## 2012-07-25 ENCOUNTER — Ambulatory Visit (INDEPENDENT_AMBULATORY_CARE_PROVIDER_SITE_OTHER): Payer: No Typology Code available for payment source | Admitting: Family Medicine

## 2012-07-25 ENCOUNTER — Encounter: Payer: Self-pay | Admitting: Family Medicine

## 2012-07-25 ENCOUNTER — Ambulatory Visit: Payer: No Typology Code available for payment source | Admitting: Family Medicine

## 2012-07-25 ENCOUNTER — Ambulatory Visit (HOSPITAL_COMMUNITY): Payer: No Typology Code available for payment source

## 2012-07-25 VITALS — BP 184/95 | HR 62 | Temp 98.0°F | Ht 65.0 in | Wt 182.7 lb

## 2012-07-25 DIAGNOSIS — H60399 Other infective otitis externa, unspecified ear: Secondary | ICD-10-CM

## 2012-07-25 DIAGNOSIS — H6090 Unspecified otitis externa, unspecified ear: Secondary | ICD-10-CM

## 2012-07-25 MED ORDER — NEOMYCIN-POLYMYXIN-HC 3.5-10000-1 OT SOLN: 3.0000 [drp] | Freq: Four times a day (QID) | OTIC | Status: DC

## 2012-07-25 NOTE — Patient Instructions (Addendum)
Nice to meet you. I am unsure why you have ear pain for so long. Keep using drops for ear canal infection and elavil at night. Will make ENT referral for evaluation. Make appointment with Dr. Earnest Bailey for follow up 1 week.

## 2012-07-25 NOTE — Progress Notes (Signed)
  Subjective:    Patient ID: Teresa [name], female    DOB: 06-Nov-1960, 52 y.o.   MRN: [id number]  HPI  1. Right ear pain. 52 yo patient with HTN, smoker having chronic problems with bilateral ear pain, shooting head pains. Has been seen for headaches, ear pain, and multiple bilateral otitis media/externa in past one year with negative head and neck CT scan. Treated for left otitis externa one week ago and started on elavil for neuralgia. Her ear symptoms have not improved, but worsened last night in the right ear. She states elavil did seem to help her sleep and pain slightly, but she woke up at 2 am with sharp pain in right ear again. Radiates to right jaw sometimes, or to inside of her head other times.  Has chronic dry eyes.    Review of Systems She denies ear drainage, hearing deficits, new visual deficit, significant pain with chewing, hoarseness, confusion, fever, chills, numbness, tingling, weakness.    Objective:   Physical Exam  Vitals reviewed. Constitutional: She is oriented to person, place, and time. She appears well-developed and well-nourished.  HENT:  Head: Normocephalic and atraumatic.  Mouth/Throat: Oropharynx is clear and moist. No oropharyngeal exudate.       TTP in right mandibular margin, anterior to ear, right paraspinal musculature/trapezius.  No clicks with TMJ ROM. No sinus or temporal TTP.  Right ear TTP with manipulation of pinna, pain with insertion of speculum. White discharge/pus in external canal and erythema adjacent to TM. TM appears normal. Left TM with slight similar appearing white discharge, TM normal landmarks.  Eyes: Conjunctivae normal and EOM are normal. Pupils are equal, round, and reactive to light.  Neck: Normal range of motion. Neck supple.  Pulmonary/Chest: Effort normal.  Lymphadenopathy:    She has no cervical adenopathy.  Neurological: She is alert and oriented to person, place, and time.  Skin: No rash noted. She is not diaphoretic.    Psychiatric:       Somewhat agitated but calms down after explaining her frustration with longstanding symptoms.       Assessment & Plan:

## 2012-07-25 NOTE — Assessment & Plan Note (Signed)
Recurrent infections with appearance of right otitis externa today, which may be triggering ear pains. Will continue drops and refer to ENT given longstanding and recurrent nature of the problem. Could also consider fungal coverage, but do not suspect based on exam.  Additionally, the shooting head pains/sharp ear pains could be a neuralgia or migrainous type syndrome, such as glossopharyngeal neuralgia, so recommended patient continue the elavil if beneficial without side effect for now. Could also consider tegretol if fails to respond after adequate trial of medication.  Case d/w Dr. Sheffield Slider.

## 2012-08-01 ENCOUNTER — Ambulatory Visit (INDEPENDENT_AMBULATORY_CARE_PROVIDER_SITE_OTHER): Payer: No Typology Code available for payment source | Admitting: Family Medicine

## 2012-08-01 ENCOUNTER — Encounter: Payer: Self-pay | Admitting: Family Medicine

## 2012-08-01 ENCOUNTER — Ambulatory Visit (HOSPITAL_COMMUNITY)
Admission: RE | Admit: 2012-08-01 | Discharge: 2012-08-01 | Disposition: A | Discharge status: Home / Self Care | Payer: No Typology Code available for payment source | Source: Ambulatory Visit | Attending: Family Medicine | Admitting: Family Medicine

## 2012-08-01 VITALS — BP 155/85 | HR 67 | Temp 98.8°F | Ht 65.0 in | Wt 182.0 lb

## 2012-08-01 DIAGNOSIS — H60399 Other infective otitis externa, unspecified ear: Secondary | ICD-10-CM

## 2012-08-01 DIAGNOSIS — Z1231 Encounter for screening mammogram for malignant neoplasm of breast: Secondary | ICD-10-CM | POA: Insufficient documentation

## 2012-08-01 DIAGNOSIS — J329 Chronic sinusitis, unspecified: Secondary | ICD-10-CM

## 2012-08-01 DIAGNOSIS — R070 Pain in throat: Secondary | ICD-10-CM

## 2012-08-01 DIAGNOSIS — H6090 Unspecified otitis externa, unspecified ear: Secondary | ICD-10-CM

## 2012-08-01 MED ORDER — HYDROCORTISONE-ACETIC ACID 1-2 % OT SOLN: 4.0000 [drp] | Freq: Two times a day (BID) | OTIC | Status: DC

## 2012-08-01 MED ORDER — DOXYCYCLINE HYCLATE 100 MG PO TABS: 100.0000 mg | ORAL_TABLET | Freq: Two times a day (BID) | ORAL | Status: DC

## 2012-08-01 NOTE — Progress Notes (Signed)
  Subjective:    Patient ID: Teresa [name], female    DOB: 05/11/1961, 52 y.o.   MRN: [id number]  HPI Here for follow-up of right otitis externa  Has been using polymixin drops for 1-2 weeks.  Somewhat improved but now some pain in left ear as well.  Described as intense itching.  No ear drainage.  Also notes worsening facial pressure, bilateral sinus congestion despite use of neti pot.  NO fever, chills or Cough.  Over maxillary sinuses and eye pressure  amitryptilien 25 mg was started several weeks ago for facial pain, patient reports it has helped with sleep and has reduced the number of painful episodes.   I have reviewed patient's  PMH, FH, and Social history and Medications as related to this visit. Patient is a smoker   Review of Systemssee HPI     Objective:   Physical Exam GEN: Alert & Oriented, No acute distress HEENT: [place]/AT. EOMI, PERRLA, no conjunctival injection or scleral icterus.  Bilateral tympanic membranes intact without erythema or effusion. Canals with dryness but no erythema or exudates.  Nares without edema or rhinorrhea.  Oropharynx is without erythema or exudates.  No anterior or posterior cervical lymphadenopathy. CV:  Regular Rate & Rhythm, no murmur Respiratory:  Normal work of breathing, CTAB          Assessment & Plan:

## 2012-08-01 NOTE — Patient Instructions (Addendum)
Stop using your polymixin ear drops  I prescribed VOsol HC for yoru ear itching  Continue amitryptiline  Start doxycycline for sinus infection  You are on the waiting list to see an ENT  Sinusitis Sinusitis an infection of the air pockets (sinuses) in your face. This can cause puffiness (swelling). It can also cause drainage from your sinuses.   HOME CARE    Only take medicine as told by your doctor.   Drink enough fluids to keep your pee (urine) clear or pale yellow.   Apply moist heat or ice packs for pain relief.   Use salt (saline) nose sprays. The spray will wet the thick fluid in the nose. This can help the sinuses drain.  GET HELP RIGHT AWAY IF:    You have a fever.   Your baby is older than 3 months with a rectal temperature of 102 F (38.9 C) or higher.   Your baby is 51 months old or younger with a rectal temperature of 100.4 F (38 C) or higher.   The pain gets worse.   You get a very bad headache.   You keep throwing up (vomiting).   Your face gets puffy.  MAKE SURE YOU:    Understand these instructions.   Will watch your condition.   Will get help right away if you are not doing well or get worse.  Document Released: 11/30/2007 Document Revised: 09/05/2011 Document Reviewed: 11/30/2007 [facility] Patient Information 2013 [place], [place].

## 2012-08-01 NOTE — Assessment & Plan Note (Signed)
otits externa now resolved with some residual itching possibly from contact dermatitis from polymixin ear drops.  Will rx vosol-HC.  Advised patient if not able to afford, may try some 1% hydrocortisone cream OTC

## 2012-08-01 NOTE — Assessment & Plan Note (Signed)
Somewhat improved with amytriptlyine.  I gave patient copies of her CT scans and ultrasounds to take with her to ENT referral.

## 2012-08-01 NOTE — Assessment & Plan Note (Signed)
Will treat with doxycycline as Augmentin not affordable for patient.

## 2012-09-03 ENCOUNTER — Encounter (HOSPITAL_COMMUNITY): Payer: Self-pay

## 2012-09-03 ENCOUNTER — Emergency Department (INDEPENDENT_AMBULATORY_CARE_PROVIDER_SITE_OTHER)
Admission: EM | Admit: 2012-09-03 | Discharge: 2012-09-03 | Disposition: A | Discharge status: Home / Self Care | Payer: No Typology Code available for payment source | Source: Home / Self Care

## 2012-09-03 DIAGNOSIS — H61109 Unspecified noninfective disorders of pinna, unspecified ear: Secondary | ICD-10-CM

## 2012-09-03 DIAGNOSIS — H61102 Unspecified noninfective disorders of pinna, left ear: Secondary | ICD-10-CM

## 2012-09-03 DIAGNOSIS — H60392 Other infective otitis externa, left ear: Secondary | ICD-10-CM

## 2012-09-03 DIAGNOSIS — H60399 Other infective otitis externa, unspecified ear: Secondary | ICD-10-CM

## 2012-09-03 MED ORDER — CEPHALEXIN 500 MG PO CAPS: 500.0000 mg | ORAL_CAPSULE | Freq: Four times a day (QID) | ORAL | Status: DC

## 2012-09-03 MED ORDER — HYDROCODONE-ACETAMINOPHEN 5-325 MG PO TABS: 1.0000 | ORAL_TABLET | ORAL | Status: DC | PRN

## 2012-09-03 MED ORDER — TRIAMCINOLONE ACETONIDE 40 MG/ML IJ SUSP
40.0000 mg | Freq: Once | INTRAMUSCULAR | Status: AC
  Administered 2012-09-03: 40 mg via INTRAMUSCULAR

## 2012-09-03 MED ORDER — TRIAMCINOLONE ACETONIDE 40 MG/ML IJ SUSP
INTRAMUSCULAR | Status: AC
  Filled 2012-09-03: qty 5

## 2012-09-03 MED ORDER — TRIAMCINOLONE ACETONIDE 0.1 % EX CREA: TOPICAL_CREAM | Freq: Two times a day (BID) | CUTANEOUS | Status: DC

## 2012-09-03 NOTE — ED Notes (Signed)
Reports pain in her left ear since last PM, thought it was a blackhead , so she had been scratching at it . This AM, left ear felt swollen, daughter commented she looked as if something had bitten her on her ear, throat feels swollen, felt lighthead

## 2012-09-03 NOTE — ED Provider Notes (Signed)
History     CSN: [id number]  Arrival date & time 09/03/12  1853   None     Chief Complaint  Patient presents with  . Otalgia    (Consider location/radiation/quality/duration/timing/severity/associated sxs/prior treatment) HPI Comments: 52 year old female presents with pain of the left outer ear. It started much earlier today. Initially there was some itching and mild swelling to the the outer ear and she applied hydrocortisone cream. Is minimally improved. But she is concerned about the discomfort, mild itching and swelling. She is also developed a mild headache associated with it. She denies problems with hearing she denies fever, chills, intraoral complaints. Her only other complaint is occasional lightheadedness.   Past Medical History  Diagnosis Date  . Hypertension   . Chronic back pain   . Anemia   . Migraine headache   . Thyroid disease 2005    [facility]  . Neck strain 02/06/2012    Past Surgical History  Procedure Laterality Date  . Cesarean section  1979 1986 1990  . Appendectomy  2006  . Abdominal hysterectomy  2006    Family History  Problem Relation Age of Onset  . Heart disease Other   . Diabetes Other   . Heart disease Mother     CAB age 27  . Diabetes Mother   . Cancer Father 70    colon cancer  . Stroke Father   . Diabetes Sister   . Kidney disease Sister     renal failure  . Diabetes Brother   . Kidney disease Brother     renal failure    History  Substance Use Topics  . Smoking status: Current Some Day Smoker -- 0.10 packs/day    Types: Cigarettes    Last Attempt to Quit: 10/25/2011  . Smokeless tobacco: Never Used     Comment: Quit date planned for October 25, 2011. Been smoking for over 20 years.  . Alcohol Use: No    OB History   Grav Para Term Preterm Abortions TAB SAB Ect Mult Living                  Review of Systems  Constitutional: Negative.   HENT: Positive for ear pain. Negative for hearing loss, sore throat, facial  swelling, rhinorrhea, sneezing, neck pain, postnasal drip and ear discharge.   Eyes: Negative.   Respiratory: Negative.   Gastrointestinal: Negative.   Musculoskeletal: Negative.   Skin: Negative.   Psychiatric/Behavioral: Negative.     Allergies  Clonidine derivatives and Aspirin  Home Medications   Current Outpatient Rx  Name  Route  Sig  Dispense  Refill  . acetic acid-hydrocortisone (VOSOL-HC) otic solution   Both Ears   Place 4 drops into both ears 2 (two) times daily.   10 mL   0   . amitriptyline (ELAVIL) 25 MG tablet   Oral   Take 1 tablet (25 mg total) by mouth at bedtime.   30 tablet   2   . amLODipine (NORVASC) 10 MG tablet   Oral   Take 1 tablet (10 mg total) by mouth at bedtime.   90 tablet   3   . cephALEXin (KEFLEX) 500 MG capsule   Oral   Take 1 capsule (500 mg total) by mouth 4 (four) times daily.   28 capsule   0   . doxycycline (VIBRA-TABS) 100 MG tablet   Oral   Take 1 tablet (100 mg total) by mouth 2 (two) times daily.   14 tablet  0   . fluconazole (DIFLUCAN) 150 MG tablet   Oral   Take 1 tablet (150 mg total) by mouth once.   1 tablet   0   . levothyroxine (SYNTHROID, LEVOTHROID) 100 MCG tablet   Oral   Take 100 mcg by mouth every morning.         Marland Kitchen lisinopril-hydrochlorothiazide (PRINZIDE,ZESTORETIC) 20-25 MG per tablet   Oral   Take 1 tablet by mouth daily.   90 tablet   3   . metoprolol tartrate (LOPRESSOR) 25 MG tablet   Oral   Take 0.5 tablets (12.5 mg total) by mouth 2 (two) times daily.   180 tablet   3   . neomycin-polymyxin-hydrocortisone (CORTISPORIN) otic solution   Both Ears   Place 3 drops into both ears 4 (four) times daily.   10 mL   0   . omeprazole (PRILOSEC) 20 MG capsule   Oral   Take 1 capsule (20 mg total) by mouth daily.   30 capsule   5   . spironolactone (ALDACTONE) 25 MG tablet   Oral   Take 50 mg by mouth daily.         Marland Kitchen triamcinolone cream (KENALOG) 0.1 %   Topical   Apply  topically 2 (two) times daily. Apply for 2 weeks. May use on face   30 g   0     BP 153/94  Pulse 78  Temp(Src) 98.6 F (37 C) (Oral)  Resp 20  SpO2 95%  Physical Exam  Nursing note and vitals reviewed. Constitutional: She is oriented to person, place, and time. She appears well-developed and well-nourished. No distress.  HENT:  Right Ear: External ear normal.  Nose: Nose normal.  Mouth/Throat: Oropharynx is clear and moist. No oropharyngeal exudate.  Swelling of the outer ear along the helix, and the antihelix and choncha. The EAC is is widely patent. There is minor retraction of the eardrum however the patient states that she has been having trouble with that for months and is currently seeing an ENT. No surrounding visible or palpable lymphadenopathy. No erythema or evidence of cellulitis. No drainage or lesions suggestive of a bite or pustule.  Eyes: Conjunctivae and EOM are normal.  Neck: Neck supple.  Cardiovascular: Normal rate.   Pulmonary/Chest: Effort normal.  Lymphadenopathy:    She has no cervical adenopathy.  Neurological: She is alert and oriented to person, place, and time. She exhibits normal muscle tone.  Skin: Skin is warm and dry. No erythema.  Psychiatric: She has a normal mood and affect.    ED Course  Procedures (including critical care time)  Labs Reviewed - No data to display No results found.   1. Pinna infection, acute, left   2. Pinna disorder, left       MDM   Triamcinolone cream to the outer ear 2-3 times a day Keflex 500 mg 4 times a day as directed  Kenalog 40 mg IM now  Followup with your primary care doctor later this week to clear getting better. If worse may return  Norco 5 mg every 4 hours when necessary pain #10   Hayden Rasmussen, NP 09/03/12 2104

## 2012-09-05 NOTE — ED Provider Notes (Signed)
Medical screening examination/treatment/procedure(s) were performed by resident physician or non-physician practitioner and as supervising physician I was immediately available for consultation/collaboration.   Marquia Costello DOUGLAS MD.   [place] Souter D Chezney Huether, MD 09/05/12 2034 

## 2012-09-25 ENCOUNTER — Ambulatory Visit (INDEPENDENT_AMBULATORY_CARE_PROVIDER_SITE_OTHER): Payer: No Typology Code available for payment source | Admitting: Family Medicine

## 2012-09-25 ENCOUNTER — Encounter: Payer: Self-pay | Admitting: Family Medicine

## 2012-09-25 VITALS — BP 156/80 | HR 69 | Temp 98.1°F | Ht 65.0 in | Wt 180.0 lb

## 2012-09-25 DIAGNOSIS — J329 Chronic sinusitis, unspecified: Secondary | ICD-10-CM

## 2012-09-25 DIAGNOSIS — S025XXA Fracture of tooth (traumatic), initial encounter for closed fracture: Secondary | ICD-10-CM | POA: Insufficient documentation

## 2012-09-25 DIAGNOSIS — S025XXS Fracture of tooth (traumatic), sequela: Secondary | ICD-10-CM

## 2012-09-25 DIAGNOSIS — S0291XS Unspecified fracture of skull, sequela: Secondary | ICD-10-CM

## 2012-09-25 DIAGNOSIS — I10 Essential (primary) hypertension: Secondary | ICD-10-CM

## 2012-09-25 MED ORDER — FLUTICASONE PROPIONATE 50 MCG/ACT NA SUSP: 2.0000 | Freq: Every day | NASAL | Status: DC

## 2012-09-25 MED ORDER — TRAMADOL HCL 50 MG PO TABS: 25.0000 mg | ORAL_TABLET | Freq: Three times a day (TID) | ORAL | Status: DC | PRN

## 2012-09-25 NOTE — Assessment & Plan Note (Signed)
Symptomatic with toothache. Tramadol prn pain. Dental care instruction was given. I referred to dentist for assessment.

## 2012-09-25 NOTE — Assessment & Plan Note (Signed)
Trial of Flonase recommended Prescription e-scribed.

## 2012-09-25 NOTE — Patient Instructions (Addendum)
Tooth Fracture A tooth consists of the root and the crown. The yellowish, softer dentin is covered by enamel to form the crown. The crown is the visible white part of the tooth. The tooth is attached by the root to the tooth socket by small ligaments. The pulp of the tooth contains the nerves and blood vessels. These are found in the pulp chamber and root canals.  THREE MAIN AND DIFFERENT TOOTH INJURIES ARE:  A fracture usually splits the tooth into two or more parts, one attached to the socket and one free.  A luxation shifts the tooth position at the level of the root but does not remove it from the socket.  An avulsion removes the entire tooth from its socket. A fracture can be classified as a root fracture, broken tooth (crown fracture), or chipped tooth. They can also be broken down to fracture types of crown, crown-root, or root. Sometimes they are also broken down into two categories that are simple (no pulp involvement) or complex (pulp involvement). A crown fracture can involve the pulp.  Tooth fracture may cause problems ranging from cosmetic defects to tooth death. Involvement of the pulp is the most important factor in severity rather than the amount of the tooth affected. Pulp involvement can be identified by a bleeding site or a pink or red dot in the middle of the dentin. Pulp exposure can be very painful. Limiting nerve exposure to air, saliva, temperature changes, and the tongue will decrease the pain. TREATMENT   The root canal can be sealed by covering the nerve with a drop of a "super glue," or cyanoacrylate. The use of cyanoacrylate has not been approved by the [place] Food and Drug Administration. This is called an off label use. However, cyanoacrylate is currently used in dentistry for similar purposes.  Gently biting into gauze or a towel will help control the bleeding. An exposed nerve requires a dental exam and care. Referral need not be immediate if the pain is  controlled.  Cover exposed dentin with a layer of zinc oxide or calcium hydroxide paste (Dycal) if it is available. PREVENTION   Most dental and associated injuries can be prevented or lessened by the use of a mouth guard. These should be worn in all contact.  Mouth guards also prevent concussions in contact and non contact sports. Document Released: 06/14/2004 Document Revised: 09/05/2011 Document Reviewed: 06/01/2007 [facility] Patient Information 2013 [place], [place].

## 2012-09-25 NOTE — Assessment & Plan Note (Signed)
Poorly controlled HTN I rechecked her BP with only slight improvement from the initial record. I recommended medication optimization,she declined at this time. Patient prefer to continue current med and work on diet and exercise. She is instructed to follow up with her PMD in 1 wk for reassessment,she agreed with plan.

## 2012-09-25 NOTE — Progress Notes (Signed)
Subjective:     Patient ID: Teresa [name], female   DOB: 16-Aug-1960, 52 y.o.   MRN: [id number]  HPI Tooth pain: C/O pain in her tooth on the right side which started while eating about 3 wks ago.She noticed while eating 3 wks ago a smal part of her tooth came off in her food,she denies any trauma or any other injury.No fever.Her pain is about 7/10 in severity,aching whenever she eats or drink some form of liquid. EXB:MWUXLKG has been compliant with all BP  Med as listed,last dose was this morning,she claimed her BP has always been good even at other doctors office.She denies any other concern related to her BP. Allergy: C/O  Nasal congestion on and off for many years for which she had been on Claritin and Zyrtec with no improvement.She has had symptom all her life and this has been worsening over the last few week.Her symptom occurs anytime of the season.She denies headache,no fever.  Past Medical History  Diagnosis Date  . Hypertension   . Chronic back pain   . Anemia   . Migraine headache   . Thyroid disease 2005    [place]  . Neck strain 02/06/2012    Review of Systems  Constitutional: Negative for fever and fatigue.  HENT: Positive for sneezing, dental problem and sinus pressure. Negative for facial swelling.   Respiratory: Negative.   Cardiovascular: Negative.   Gastrointestinal: Negative.   Neurological: Negative.   All other systems reviewed and are negative.   Filed Vitals:   09/25/12 1010 09/25/12 1025  BP: 161/82 156/80  Pulse: 69   Temp: 98.1 F (36.7 C)   TempSrc: Oral   Height: 5\' 5"  (1.651 m)   Weight: 180 lb (81.647 kg)        Objective:   Physical Exam  Nursing note and vitals reviewed. Constitutional: She is oriented to person, place, and time. She appears well-developed. No distress.  HENT:  Head: Normocephalic.  Nose: Right sinus exhibits no maxillary sinus tenderness. Left sinus exhibits no maxillary sinus tenderness.  Mouth/Throat:    Eyes:  Conjunctivae are normal.  Cardiovascular: Normal rate, regular rhythm, normal heart sounds and intact distal pulses.   No murmur heard. Pulmonary/Chest: Effort normal and breath sounds normal. No respiratory distress. She has no wheezes.  Abdominal: Soft. Bowel sounds are normal. She exhibits no distension and no mass. There is no tenderness.  Neurological: She is alert and oriented to person, place, and time. No cranial nerve deficit.        Assessment/Plan:

## 2012-10-20 ENCOUNTER — Other Ambulatory Visit: Payer: Self-pay | Admitting: Family Medicine

## 2012-10-23 ENCOUNTER — Ambulatory Visit (INDEPENDENT_AMBULATORY_CARE_PROVIDER_SITE_OTHER): Payer: No Typology Code available for payment source | Admitting: Family Medicine

## 2012-10-23 ENCOUNTER — Encounter: Payer: Self-pay | Admitting: Family Medicine

## 2012-10-23 VITALS — BP 192/94 | HR 66 | Temp 98.9°F | Ht 65.0 in | Wt 179.0 lb

## 2012-10-23 DIAGNOSIS — I10 Essential (primary) hypertension: Secondary | ICD-10-CM

## 2012-10-23 DIAGNOSIS — J329 Chronic sinusitis, unspecified: Secondary | ICD-10-CM

## 2012-10-23 MED ORDER — OMEPRAZOLE 20 MG PO CPDR: 20.0000 mg | DELAYED_RELEASE_CAPSULE | Freq: Every day | ORAL | Status: DC

## 2012-10-23 MED ORDER — SPIRONOLACTONE 25 MG PO TABS: ORAL_TABLET | ORAL | Status: DC

## 2012-10-23 MED ORDER — AMLODIPINE BESYLATE 10 MG PO TABS: 10.0000 mg | ORAL_TABLET | Freq: Every day | ORAL | Status: DC

## 2012-10-23 MED ORDER — LEVOTHYROXINE SODIUM 100 MCG PO TABS: 100.0000 ug | ORAL_TABLET | Freq: Every morning | ORAL | Status: DC

## 2012-10-23 MED ORDER — LISINOPRIL-HYDROCHLOROTHIAZIDE 20-25 MG PO TABS: ORAL_TABLET | ORAL | Status: DC

## 2012-10-23 MED ORDER — METOPROLOL TARTRATE 25 MG PO TABS: 12.5000 mg | ORAL_TABLET | Freq: Two times a day (BID) | ORAL | Status: DC

## 2012-10-23 MED ORDER — CETIRIZINE HCL 10 MG PO TABS: 10.0000 mg | ORAL_TABLET | Freq: Every day | ORAL | Status: DC

## 2012-10-23 NOTE — Patient Instructions (Addendum)
It was nice seeing you today.  I have refilled your medications and also started you on an antihistamine.  You can also use a Nettipot or nasal saline.  Please be sure to follow up with ENT and continue to use your Flonase.  Be sure to address your chronic sinus issues with ENT.

## 2012-10-23 NOTE — Assessment & Plan Note (Signed)
Patient reports that she is out of her medication and has not taken it today. Refilled all antihypertensives today.  Encouraged compliance.

## 2012-10-23 NOTE — Progress Notes (Signed)
Subjective:     Patient ID: Teresa [name], female   DOB: April 25, 1961, 52 y.o.   MRN: [id number]  HPI 52 year old female presents with URI symptoms and chest congestion  1) URI symptoms and chest congestion - She has had sore throat, sneezing, runny nose, and cough.  This has been present for weeks and worsening for approximately 1 week. - No associated fever, chills.  - Reports clear sputum production.  Also reports chest congestion.  She states that she feels "rattling" in her chest. - No purulent nasal discharge. - This, especially the sore throat, appears to be a chronic problem for the patient.  She is followed by ENT and was seen last week.  She continues to be on Flonase.  Review of Systems See HPI     Objective:   Physical Exam  Constitutional: She appears well-developed and well-nourished.  Appears in mild distress secondary to pain.  HENT:  Head: Normocephalic and atraumatic.  Mouth/Throat: No oropharyngeal exudate.    Right and left TM's appear scarred/sclerotic. Labelled tooth recently removed.  No abscess or focal infection.  Pulmonary/Chest: Effort normal and breath sounds normal. She has no wheezes. She has no rales.  Lymphadenopathy:    She has no cervical adenopathy.      Assessment:         Plan:

## 2012-10-23 NOTE — Assessment & Plan Note (Signed)
Appears to be chronic. Recommended that patient follow up with closely with ENT. Started Antihistamine for symptomatic treatment today.  Also recommended daily use of nettipot. Due to lack of systemic symptoms and signs of bacterial infection (no purulent discharge), will not prescribe antibiotic course at this time.   Encouraged patient to return if symptoms worsen.

## 2012-11-12 ENCOUNTER — Ambulatory Visit: Payer: No Typology Code available for payment source | Admitting: Family Medicine

## 2013-02-08 ENCOUNTER — Encounter (HOSPITAL_COMMUNITY): Payer: Self-pay | Admitting: Emergency Medicine

## 2013-02-08 ENCOUNTER — Emergency Department (HOSPITAL_COMMUNITY)
Admission: EM | Admit: 2013-02-08 | Discharge: 2013-02-08 | Disposition: A | Discharge status: Home / Self Care | Payer: Self-pay | Attending: Emergency Medicine | Admitting: Emergency Medicine

## 2013-02-08 DIAGNOSIS — Z8739 Personal history of other diseases of the musculoskeletal system and connective tissue: Secondary | ICD-10-CM | POA: Insufficient documentation

## 2013-02-08 DIAGNOSIS — F17200 Nicotine dependence, unspecified, uncomplicated: Secondary | ICD-10-CM | POA: Insufficient documentation

## 2013-02-08 DIAGNOSIS — Z87828 Personal history of other (healed) physical injury and trauma: Secondary | ICD-10-CM | POA: Insufficient documentation

## 2013-02-08 DIAGNOSIS — E0500 Thyrotoxicosis with diffuse goiter without thyrotoxic crisis or storm: Secondary | ICD-10-CM | POA: Insufficient documentation

## 2013-02-08 DIAGNOSIS — L03113 Cellulitis of right upper limb: Secondary | ICD-10-CM

## 2013-02-08 DIAGNOSIS — G43909 Migraine, unspecified, not intractable, without status migrainosus: Secondary | ICD-10-CM | POA: Insufficient documentation

## 2013-02-08 DIAGNOSIS — R21 Rash and other nonspecific skin eruption: Secondary | ICD-10-CM | POA: Insufficient documentation

## 2013-02-08 DIAGNOSIS — I10 Essential (primary) hypertension: Secondary | ICD-10-CM | POA: Insufficient documentation

## 2013-02-08 DIAGNOSIS — L02519 Cutaneous abscess of unspecified hand: Secondary | ICD-10-CM | POA: Insufficient documentation

## 2013-02-08 DIAGNOSIS — R6889 Other general symptoms and signs: Secondary | ICD-10-CM | POA: Insufficient documentation

## 2013-02-08 DIAGNOSIS — Z862 Personal history of diseases of the blood and blood-forming organs and certain disorders involving the immune mechanism: Secondary | ICD-10-CM | POA: Insufficient documentation

## 2013-02-08 DIAGNOSIS — L03019 Cellulitis of unspecified finger: Secondary | ICD-10-CM | POA: Insufficient documentation

## 2013-02-08 DIAGNOSIS — Z79899 Other long term (current) drug therapy: Secondary | ICD-10-CM | POA: Insufficient documentation

## 2013-02-08 MED ORDER — CEPHALEXIN 250 MG PO CAPS
500.0000 mg | ORAL_CAPSULE | Freq: Once | ORAL | Status: AC
  Administered 2013-02-08: 500 mg via ORAL
  Filled 2013-02-08: qty 2

## 2013-02-08 MED ORDER — CEPHALEXIN 500 MG PO CAPS: 500.0000 mg | ORAL_CAPSULE | Freq: Four times a day (QID) | ORAL | Status: DC

## 2013-02-08 NOTE — ED Provider Notes (Signed)
CSN: [id number]     Arrival date & time 02/08/13  1020 History     First MD Initiated Contact with Patient 02/08/13 1024     Chief Complaint  Patient presents with  . Wound Infection   (Consider location/radiation/quality/duration/timing/severity/associated sxs/prior Treatment) HPI  52 year old female with a erythematous lesion to right forearm which he first noticed yesterday. No specific trauma that she is aware of. Area itches. Today she noticed some red streaking extending up her arm from this area. No fevers or chills. No nausea or vomiting. Patient did rub some hydrocortisone cream on it yesterday, otherwise no intervention.  On review of symptoms she is also c/o of some throat tightness. This preceded other symptoms by about 2 days though. Constant. No facial swelling. No cough, wheezing or sob. No dizziness or lightheadedness. No n/v or abdominal pain. No new specific exposures that she is aware of. She has been in the process of moving though over the past couple day though.   Past Medical History  Diagnosis Date  . Hypertension   . Chronic back pain   . Anemia   . Migraine headache   . Thyroid disease 2005    [place]  . Neck strain 02/06/2012   Past Surgical History  Procedure Laterality Date  . Cesarean section  1979 1986 1990  . Appendectomy  2006  . Abdominal hysterectomy  2006   Family History  Problem Relation Age of Onset  . Heart disease Other   . Diabetes Other   . Heart disease Mother     CAB age 40  . Diabetes Mother   . Cancer Father 64    colon cancer  . Stroke Father   . Diabetes Sister   . Kidney disease Sister     renal failure  . Diabetes Brother   . Kidney disease Brother     renal failure   History  Substance Use Topics  . Smoking status: Current Some Day Smoker -- 0.10 packs/day    Types: Cigarettes    Last Attempt to Quit: 10/25/2011  . Smokeless tobacco: Never Used     Comment: Quit date is today (09/25/12)  . Alcohol Use: No   OB  History   Grav Para Term Preterm Abortions TAB SAB Ect Mult Living                 Review of Systems  All systems reviewed and negative, other than as noted in HPI.   Allergies  Clonidine derivatives and Aspirin  Home Medications   Current Outpatient Rx  Name  Route  Sig  Dispense  Refill  . amitriptyline (ELAVIL) 25 MG tablet   Oral   Take 1 tablet (25 mg total) by mouth at bedtime.   30 tablet   2   . amLODipine (NORVASC) 10 MG tablet   Oral   Take 1 tablet (10 mg total) by mouth at bedtime.   90 tablet   3   . cephALEXin (KEFLEX) 500 MG capsule   Oral   Take 1 capsule (500 mg total) by mouth 4 (four) times daily.   28 capsule   0   . cetirizine (ZYRTEC) 10 MG tablet   Oral   Take 1 tablet (10 mg total) by mouth daily.   90 tablet   3   . levothyroxine (SYNTHROID, LEVOTHROID) 100 MCG tablet   Oral   Take 1 tablet (100 mcg total) by mouth every morning.   90 tablet   3   .  lisinopril-hydrochlorothiazide (PRINZIDE,ZESTORETIC) 20-25 MG per tablet      TAKE ONE TABLET BY MOUTH EVERY DAY   90 tablet   3   . metoprolol tartrate (LOPRESSOR) 25 MG tablet   Oral   Take 0.5 tablets (12.5 mg total) by mouth 2 (two) times daily.   90 tablet   3   . spironolactone (ALDACTONE) 25 MG tablet      TAKE TWO TABLETS BY MOUTH EVERY DAY   180 tablet   3    BP 160/86  Pulse 76  Temp(Src) 98.6 F (37 C)  Resp 16  SpO2 100% Physical Exam  Nursing note and vitals reviewed. Constitutional: She appears well-developed and well-nourished. No distress.  HENT:  Head: Normocephalic and atraumatic.  Mouth/Throat: Oropharynx is clear and moist.  Eyes: Conjunctivae are normal. Right eye exhibits no discharge. Left eye exhibits no discharge.  Neck: Normal range of motion. Neck supple.  Cardiovascular: Normal rate, regular rhythm and normal heart sounds.  Exam reveals no gallop and no friction rub.   No murmur heard. Pulmonary/Chest: Effort normal and breath sounds  normal. No stridor. No respiratory distress.  Abdominal: Soft. She exhibits no distension. There is no tenderness.  Musculoskeletal: She exhibits no edema and no tenderness.  Neurological: She is alert.  Skin: Skin is warm and dry. Rash noted. There is erythema.  Approximately 3 centimeter roughly circular area soft tissue swelling and induration into the volar aspect proximal right forearm. There is a small area of streaking erythema proximally to the level of antecubital fossa. Warm to touch. No fluctuance. No drainage. Not tender to touch. Neurovascularly intact distally. No axillary nodes palpated.  Psychiatric: She has a normal mood and affect. Her behavior is normal. Thought content normal.    ED Course   Procedures (including critical care time)  Labs Reviewed - No data to display No results found. 1. Cellulitis of forearm, right     MDM  52 year old female with mild cellulitis to right forearm. Afebrile and not systemically ill. No history of diabetes or other identified immunocompromising factors. No associated abscess. Is appropriate for outpatient therapy. Will give a course of cephalexin. Return precautions were discussed. Outpatient followup otherwise.   Raeford Razor, MD 02/08/13 1640

## 2013-02-08 NOTE — ED Notes (Signed)
Pt has bite of some type on back of right forearm that is red swollen and has heat.  There is one red streak moving from bite to elbow.  Pt alert oriented X4.  Pt has denied fever, chills but states her thraot feels congested.

## 2013-02-08 NOTE — ED Notes (Addendum)
Bump to rt forearm since yesterday has gotten bigger and she thinks she has streaks going up her arm area is re and swollen and her throat feels clogged

## 2013-02-11 ENCOUNTER — Inpatient Hospital Stay (HOSPITAL_COMMUNITY)
Admission: EM | Admit: 2013-02-11 | Discharge: 2013-02-13 | DRG: 603 | Disposition: A | Discharge status: Home / Self Care | Payer: MEDICAID | Attending: Internal Medicine | Admitting: Internal Medicine

## 2013-02-11 ENCOUNTER — Encounter (HOSPITAL_COMMUNITY): Payer: Self-pay | Admitting: *Deleted

## 2013-02-11 DIAGNOSIS — Z87891 Personal history of nicotine dependence: Secondary | ICD-10-CM | POA: Diagnosis present

## 2013-02-11 DIAGNOSIS — K219 Gastro-esophageal reflux disease without esophagitis: Secondary | ICD-10-CM

## 2013-02-11 DIAGNOSIS — E039 Hypothyroidism, unspecified: Secondary | ICD-10-CM | POA: Diagnosis present

## 2013-02-11 DIAGNOSIS — E041 Nontoxic single thyroid nodule: Secondary | ICD-10-CM | POA: Diagnosis present

## 2013-02-11 DIAGNOSIS — G43909 Migraine, unspecified, not intractable, without status migrainosus: Secondary | ICD-10-CM | POA: Diagnosis present

## 2013-02-11 DIAGNOSIS — F17200 Nicotine dependence, unspecified, uncomplicated: Secondary | ICD-10-CM | POA: Diagnosis present

## 2013-02-11 DIAGNOSIS — G8929 Other chronic pain: Secondary | ICD-10-CM | POA: Diagnosis present

## 2013-02-11 DIAGNOSIS — L0291 Cutaneous abscess, unspecified: Secondary | ICD-10-CM

## 2013-02-11 DIAGNOSIS — Z720 Tobacco use: Secondary | ICD-10-CM

## 2013-02-11 DIAGNOSIS — IMO0002 Reserved for concepts with insufficient information to code with codable children: Principal | ICD-10-CM | POA: Diagnosis present

## 2013-02-11 DIAGNOSIS — I10 Essential (primary) hypertension: Secondary | ICD-10-CM | POA: Diagnosis present

## 2013-02-11 DIAGNOSIS — M549 Dorsalgia, unspecified: Secondary | ICD-10-CM | POA: Diagnosis present

## 2013-02-11 DIAGNOSIS — E876 Hypokalemia: Secondary | ICD-10-CM | POA: Diagnosis present

## 2013-02-11 DIAGNOSIS — Z79899 Other long term (current) drug therapy: Secondary | ICD-10-CM

## 2013-02-11 DIAGNOSIS — L0390 Cellulitis, unspecified: Secondary | ICD-10-CM

## 2013-02-11 HISTORY — DX: Nonspecific reaction to tuberculin skin test without active tuberculosis: R76.11

## 2013-02-11 HISTORY — DX: Personal history of other diseases of the digestive system: Z87.19

## 2013-02-11 HISTORY — DX: Low back pain: M54.5

## 2013-02-11 HISTORY — DX: Low back pain, unspecified: M54.50

## 2013-02-11 HISTORY — DX: Other chronic pain: G89.29

## 2013-02-11 HISTORY — DX: Thyrotoxicosis with diffuse goiter without thyrotoxic crisis or storm: E05.00

## 2013-02-11 LAB — CBC WITH DIFFERENTIAL/PLATELET
Basophils Absolute: 0.1 10*3/uL (ref 0.0–0.1)
Basophils Relative: 1 % (ref 0–1)
Eosinophils Absolute: 0.4 10*3/uL (ref 0.0–0.7)
Eosinophils Relative: 5 % (ref 0–5)
HCT: 36.8 % (ref 36.0–46.0)
Hemoglobin: 12.7 g/dL (ref 12.0–15.0)
Lymphocytes Relative: 40 % (ref 12–46)
Lymphs Abs: 3.3 10*3/uL (ref 0.7–4.0)
MCH: 28 pg (ref 26.0–34.0)
MCHC: 34.5 g/dL (ref 30.0–36.0)
MCV: 81.2 fL (ref 78.0–100.0)
Monocytes Absolute: 0.4 10*3/uL (ref 0.1–1.0)
Monocytes Relative: 4 % (ref 3–12)
Neutro Abs: 4.3 10*3/uL (ref 1.7–7.7)
Neutrophils Relative %: 51 % (ref 43–77)
Platelets: 287 10*3/uL (ref 150–400)
RBC: 4.53 MIL/uL (ref 3.87–5.11)
RDW: 13.9 % (ref 11.5–15.5)
WBC: 8.4 10*3/uL (ref 4.0–10.5)

## 2013-02-11 LAB — POCT I-STAT, CHEM 8
BUN: 11 mg/dL (ref 6–23)
Calcium, Ion: 1.19 mmol/L (ref 1.12–1.23)
Chloride: 105 mEq/L (ref 96–112)
Creatinine, Ser: 0.8 mg/dL (ref 0.50–1.10)
Glucose, Bld: 96 mg/dL (ref 70–99)
HCT: 39 % (ref 36.0–46.0)
Hemoglobin: 13.3 g/dL (ref 12.0–15.0)
Potassium: 3.2 mEq/L — ABNORMAL LOW (ref 3.5–5.1)
Sodium: 140 mEq/L (ref 135–145)
TCO2: 24 mmol/L (ref 0–100)

## 2013-02-11 MED ORDER — MORPHINE SULFATE 4 MG/ML IJ SOLN
4.0000 mg | Freq: Once | INTRAMUSCULAR | Status: AC
  Administered 2013-02-11: 4 mg via INTRAVENOUS
  Filled 2013-02-11: qty 1

## 2013-02-11 MED ORDER — POTASSIUM CHLORIDE CRYS ER 20 MEQ PO TBCR
40.0000 meq | EXTENDED_RELEASE_TABLET | Freq: Once | ORAL | Status: AC
  Administered 2013-02-11: 40 meq via ORAL
  Filled 2013-02-11: qty 2

## 2013-02-11 MED ORDER — VANCOMYCIN HCL IN DEXTROSE 1-5 GM/200ML-% IV SOLN
1000.0000 mg | Freq: Once | INTRAVENOUS | Status: AC
  Administered 2013-02-11: 1000 mg via INTRAVENOUS
  Filled 2013-02-11: qty 200

## 2013-02-11 MED ORDER — FAMOTIDINE IN NACL 20-0.9 MG/50ML-% IV SOLN
20.0000 mg | Freq: Once | INTRAVENOUS | Status: AC
  Administered 2013-02-11: 20 mg via INTRAVENOUS
  Filled 2013-02-11: qty 50

## 2013-02-11 MED ORDER — SODIUM CHLORIDE 0.9 % IV SOLN
INTRAVENOUS | Status: DC
  Administered 2013-02-11 – 2013-02-13 (×4): via INTRAVENOUS

## 2013-02-11 MED ORDER — ONDANSETRON HCL 4 MG/2ML IJ SOLN
4.0000 mg | Freq: Once | INTRAMUSCULAR | Status: AC
  Administered 2013-02-11: 4 mg via INTRAVENOUS
  Filled 2013-02-11: qty 2

## 2013-02-11 MED ORDER — SODIUM CHLORIDE 0.9 % IV SOLN
Freq: Once | INTRAVENOUS | Status: AC
  Administered 2013-02-11: 20:00:00 via INTRAVENOUS

## 2013-02-11 NOTE — ED Notes (Signed)
Pt was seen here and placed on po anbx for spider biter to right forearm but now redness is swelling up her arm

## 2013-02-11 NOTE — H&P (Signed)
[facility] History and Physical  Teresa [name] AVW:[id number] DOB: [birth year]    PCP:   NONE  Chief Complaint: right arm redness and swelling increased.  HPI: Teresa [name] is an 52 y.o. female with hx of HTN, Grave's disease, anemia, hx of migraine, presents to the ER as her right arm redness and swelling has increased since she has been taken her Keflex.  She said she thought she was bitten by something on her right forearm, and it has been tender there.  She has been placed on Keflex, and it has been five days since the redness increase and spread proximally.  There has been no fever, chills, or any systemic symptoms.  She has no leukocytosis, and her labs were unremarkable.  Hospitalist was asked to admit her for cellulitis not responding to outpatient oral antibiotic.  Rewiew of Systems:  Constitutional: Negative for malaise, fever and chills. No significant weight loss or weight gain Eyes: Negative for eye pain, redness and discharge, diplopia, visual changes, or flashes of light. ENMT: Negative for ear pain, hoarseness, nasal congestion, sinus pressure and sore throat. No headaches; tinnitus, drooling, or problem swallowing. Cardiovascular: Negative for chest pain, palpitations, diaphoresis, dyspnea and peripheral edema. ; No orthopnea, PND Respiratory: Negative for cough, hemoptysis, wheezing and stridor. No pleuritic chestpain. Gastrointestinal: Negative for nausea, vomiting, diarrhea, constipation, abdominal pain, melena, blood in stool, hematemesis, jaundice and rectal bleeding.    Genitourinary: Negative for frequency, dysuria, incontinence,flank pain and hematuria; Musculoskeletal: Negative for back pain and neck pain. Negative for swelling and trauma.;  Skin: . Negative for pruritus, rash, abrasions, bruising and skin lesion.; ulcerations Neuro: Negative for headache, lightheadedness and neck stiffness. Negative for weakness, altered level of consciousness , altered mental  status, extremity weakness, burning feet, involuntary movement, seizure and syncope.  Psych: negative for anxiety, depression, insomnia, tearfulness, panic attacks, hallucinations, paranoia, suicidal or homicidal ideation   Past Medical History  Diagnosis Date  . Hypertension   . Chronic back pain   . Anemia   . Migraine headache   . Thyroid disease 2005    Graves  . Neck strain 02/06/2012    Past Surgical History  Procedure Laterality Date  . Cesarean section  1979 1986 1990  . Appendectomy  2006  . Abdominal hysterectomy  2006    Medications:  HOME MEDS: Prior to Admission medications   Medication Sig Start Date End Date Taking? Authorizing Provider  amitriptyline (ELAVIL) 25 MG tablet Take 1 tablet (25 mg total) by mouth at bedtime. 07/17/12  Yes Macy Mis, MD  amLODipine (NORVASC) 10 MG tablet Take 1 tablet (10 mg total) by mouth at bedtime. 10/23/12 10/23/13 Yes Jayce G Cook, DO  cephALEXin (KEFLEX) 500 MG capsule Take 500 mg by mouth 4 (four) times daily. For 7 days; Start date 02/08/13   Yes Historical Provider, MD  cetirizine (ZYRTEC) 10 MG tablet Take 1 tablet (10 mg total) by mouth daily. 10/23/12  Yes Tommie Sams, DO  levothyroxine (SYNTHROID, LEVOTHROID) 100 MCG tablet Take 1 tablet (100 mcg total) by mouth every morning. 10/23/12  Yes Tommie Sams, DO  lisinopril-hydrochlorothiazide (PRINZIDE,ZESTORETIC) 20-25 MG per tablet Take 1 tablet by mouth daily.  10/23/12  Yes Tommie Sams, DO  metoprolol tartrate (LOPRESSOR) 25 MG tablet Take 0.5 tablets (12.5 mg total) by mouth 2 (two) times daily. 10/23/12 10/23/13 Yes Jayce G Cook, DO  spironolactone (ALDACTONE) 25 MG tablet Take 25 mg by mouth 2 (two) times daily. 10/23/12  Yes Jayce  Berton Lan, DO     Allergies:  Allergies  Allergen Reactions  . Clonidine Derivatives Swelling    Facial swelling  . Aspirin Nausea Only    Social History:   reports that she has been smoking Cigarettes.  She has been smoking about 0.10  packs per day. She has never used smokeless tobacco. She reports that she does not drink alcohol or use illicit drugs.  Family History: Family History  Problem Relation Age of Onset  . Heart disease Other   . Diabetes Other   . Heart disease Mother     CAB age 46  . Diabetes Mother   . Cancer Father 21    colon cancer  . Stroke Father   . Diabetes Sister   . Kidney disease Sister     renal failure  . Diabetes Brother   . Kidney disease Brother     renal failure     Physical Exam: Filed Vitals:   02/11/13 1948 02/11/13 2000 02/11/13 2015 02/11/13 2030  BP:  152/86  167/89  Pulse: 59 56 57 58  Temp:      TempSrc:      Resp:      SpO2: 100% 100% 99% 99%   Blood pressure 167/89, pulse 58, temperature 98.3 F (36.8 C), temperature source Oral, resp. rate 18, SpO2 99.00%.  GEN:  Pleasant  patient lying in the stretcher in no acute distress; cooperative with exam. PSYCH:  alert and oriented x4; does not appear anxious or depressed; affect is appropriate. HEENT: Mucous membranes pink and anicteric; PERRLA; EOM intact; no cervical lymphadenopathy nor thyromegaly or carotid bruit; no JVD; There were no stridor. Neck is very supple. Breasts:: Not examined CHEST [place]: No tenderness CHEST: Normal respiration, clear to auscultation bilaterally.  HEART: Regular rate and rhythm.  There are no murmur, rub, or gallops.   BACK: No kyphosis or scoliosis; no CVA tenderness ABDOMEN: soft and non-tender; no masses, no organomegaly, normal abdominal bowel sounds; no pannus; no intertriginous candida. There is no rebound and no distention. Rectal Exam: Not done EXTREMITIES: No bone or joint deformity; age-appropriate arthropathy of the hands and knees; no edema; no ulcerations.  There is no calf tenderness. Genitalia: not examined PULSES: 2+ and symmetric SKIN: Normal hydration. There is a rash on the right arm and forearm.  It was marked, dated, and time. CNS: Cranial nerves 2-12 grossly  intact no focal lateralizing neurologic deficit.  Speech is fluent; uvula elevated with phonation, facial symmetry and tongue midline. DTR are normal bilaterally, cerebella exam is intact, barbinski is negative and strengths are equaled bilaterally.  No sensory loss.   Labs on Admission:  Basic Metabolic Panel:  Recent Labs Lab 02/11/13 2020  NA 140  K 3.2*  CL 105  GLUCOSE 96  BUN 11  CREATININE 0.80   Liver Function Tests: No results found for this basename: AST, ALT, ALKPHOS, BILITOT, PROT, ALBUMIN,  in the last 168 hours No results found for this basename: LIPASE, AMYLASE,  in the last 168 hours No results found for this basename: AMMONIA,  in the last 168 hours CBC:  Recent Labs Lab 02/11/13 1958 02/11/13 2020  WBC 8.4  --   NEUTROABS 4.3  --   HGB 12.7 13.3  HCT 36.8 39.0  MCV 81.2  --   PLT 287  --    Cardiac Enzymes: No results found for this basename: CKTOTAL, CKMB, CKMBINDEX, TROPONINI,  in the last 168 hours  CBG: No results found  for this basename: GLUCAP,  in the last 168 hours   Radiological Exams on Admission: No results found.  Assessment/Plan Present on Admission:  . Cellulitis and abscess of upper arm and forearm . Hypertension . Hypothyroidism . Tobacco abuse  PLAN:  I am not sure if it is cellulitis or an allergic, inflammatory response.  I suspect it may be a cellulitis.  In any event, there area is quite small.  Will give VAN IV along with antihistamine.  I have drawn the border of her rash, dated, and timed for comparison.  For her hypothyrodism, will continue her med and check TSH.  For her HTN, will continue her meds as well.  She was given some pain meds, and I have encouraged her to stop smoking (she smokes 3 cigarettes per day). She has slightly low K and will be supplemented.  She is stable, full code, and will be admitted to [facility] [place] service.  Thank you for allowing me to take care of this pleasant patient.  Other plans as per  orders.  Code Status: FULL Unk Lightning, MD. [facility] Pager [phone] 7pm to 7am.  02/11/2013, 11:59 PM

## 2013-02-11 NOTE — ED Provider Notes (Signed)
CSN: [id number]     Arrival date & time 02/11/13  1823 History     First MD Initiated Contact with Patient 02/11/13 1957     Chief Complaint  Patient presents with  . Insect Bite   (Consider location/radiation/quality/duration/timing/severity/associated sxs/prior Treatment) HPI Comments: Patient was seen in this emergency room 3 days ago for presumed insect bite on her right forearm.  She, states she's been taking the antibiotic/Keflex, as directed.  Her arm.  Has become more tender, swollen, red, with streaking up to mid upper arm.  She's developed chills, and myalgias, and return for further treatment.  The history is provided by the patient.    Past Medical History  Diagnosis Date  . Hypertension   . Chronic back pain   . Anemia   . Migraine headache   . Thyroid disease 2005    Gra[place]  . Neck strain 02/06/2012   Past Surgical History  Procedure Laterality Date  . Cesarean section  1979 1986 1990  . Appendectomy  2006  . Abdominal hysterectomy  2006   Family History  Problem Relation Age of Onset  . Heart disease Other   . Diabetes Other   . Heart disease Mother     CAB age 35  . Diabetes Mother   . Cancer Father 18    colon cancer  . Stroke Father   . Diabetes Sister   . Kidney disease Sister     renal failure  . Diabetes Brother   . Kidney disease Brother     renal failure   History  Substance Use Topics  . Smoking status: Current Some Day Smoker -- 0.10 packs/day    Types: Cigarettes    Last Attempt to Quit: 10/25/2011  . Smokeless tobacco: Never Used     Comment: Quit date is today (09/25/12)  . Alcohol Use: No   OB History   Grav Para Term Preterm Abortions TAB SAB Ect Mult Living                 Review of Systems  Constitutional: Positive for chills. Negative for fever.  Musculoskeletal: Positive for myalgias.  Skin: Positive for wound.  All other systems reviewed and are negative.    Allergies  Clonidine derivatives and Aspirin  Home  Medications   Current Outpatient Rx  Name  Route  Sig  Dispense  Refill  . amitriptyline (ELAVIL) 25 MG tablet   Oral   Take 1 tablet (25 mg total) by mouth at bedtime.   30 tablet   2   . amLODipine (NORVASC) 10 MG tablet   Oral   Take 1 tablet (10 mg total) by mouth at bedtime.   90 tablet   3   . cephALEXin (KEFLEX) 500 MG capsule   Oral   Take 500 mg by mouth 4 (four) times daily. For 7 days; Start date 02/08/13         . cetirizine (ZYRTEC) 10 MG tablet   Oral   Take 1 tablet (10 mg total) by mouth daily.   90 tablet   3   . levothyroxine (SYNTHROID, LEVOTHROID) 100 MCG tablet   Oral   Take 1 tablet (100 mcg total) by mouth every morning.   90 tablet   3   . lisinopril-hydrochlorothiazide (PRINZIDE,ZESTORETIC) 20-25 MG per tablet   Oral   Take 1 tablet by mouth daily.          . metoprolol tartrate (LOPRESSOR) 25 MG tablet   Oral  Take 0.5 tablets (12.5 mg total) by mouth 2 (two) times daily.   90 tablet   3   . spironolactone (ALDACTONE) 25 MG tablet   Oral   Take 25 mg by mouth 2 (two) times daily.          BP 167/89  Pulse 58  Temp(Src) 98.3 F (36.8 C) (Oral)  Resp 18  SpO2 99% Physical Exam  Nursing note and vitals reviewed. Constitutional: She appears well-developed and well-nourished.  HENT:  Head: Normocephalic.  Eyes: Pupils are equal, round, and reactive to light.  Neck: Normal range of motion.  Cardiovascular: Normal rate and regular rhythm.   Pulmonary/Chest: Effort normal and breath sounds normal.  Musculoskeletal: Normal range of motion. She exhibits tenderness. She exhibits no edema.       Right elbow: She exhibits normal range of motion, no swelling, no effusion and no deformity. Tenderness found.       Arms: Neurological: She is alert.  Skin: Skin is warm. There is erythema.    ED Course   Procedures (including critical care time)  Labs Reviewed  POCT I-STAT, CHEM 8 - Abnormal; Notable for the following:     Potassium 3.2 (*)    All other components within normal limits  CBC WITH DIFFERENTIAL   No results found. 1. Cellulitis     MDM   Will obtain labs, start IV vancomycin consider admission We'll admit for outpatient therapy, failure  Arman Filter, NP 02/11/13 2012  Arman Filter, NP 02/11/13 2256  Arman Filter, NP 02/11/13 2258

## 2013-02-12 ENCOUNTER — Encounter (HOSPITAL_COMMUNITY): Payer: Self-pay | Admitting: General Practice

## 2013-02-12 DIAGNOSIS — IMO0002 Reserved for concepts with insufficient information to code with codable children: Principal | ICD-10-CM

## 2013-02-12 DIAGNOSIS — E041 Nontoxic single thyroid nodule: Secondary | ICD-10-CM | POA: Diagnosis present

## 2013-02-12 LAB — TSH
TSH: 15.778 u[IU]/mL — ABNORMAL HIGH (ref 0.350–4.500)
TSH: 12.639 u[IU]/mL — ABNORMAL HIGH (ref 0.350–4.500)

## 2013-02-12 LAB — T4, FREE: Free T4: 0.91 ng/dL (ref 0.80–1.80)

## 2013-02-12 MED ORDER — ONDANSETRON HCL 4 MG/2ML IJ SOLN
4.0000 mg | Freq: Four times a day (QID) | INTRAMUSCULAR | Status: DC | PRN
Start: 2013-02-12 — End: 2013-02-13

## 2013-02-12 MED ORDER — LEVOTHYROXINE SODIUM 50 MCG PO TABS
50.0000 ug | ORAL_TABLET | Freq: Every day | ORAL | Status: DC
  Administered 2013-02-12 – 2013-02-13 (×2): 50 ug via ORAL
  Filled 2013-02-12 (×3): qty 1

## 2013-02-12 MED ORDER — MORPHINE SULFATE 2 MG/ML IJ SOLN
2.0000 mg | INTRAMUSCULAR | Status: DC | PRN
  Administered 2013-02-12 (×3): 2 mg via INTRAVENOUS
  Filled 2013-02-12 (×3): qty 1

## 2013-02-12 MED ORDER — POTASSIUM CHLORIDE CRYS ER 20 MEQ PO TBCR
40.0000 meq | EXTENDED_RELEASE_TABLET | Freq: Every day | ORAL | Status: DC
  Administered 2013-02-12 – 2013-02-13 (×2): 40 meq via ORAL
  Filled 2013-02-12 (×2): qty 2

## 2013-02-12 MED ORDER — DOCUSATE SODIUM 100 MG PO CAPS
100.0000 mg | ORAL_CAPSULE | Freq: Two times a day (BID) | ORAL | Status: DC
  Administered 2013-02-12 – 2013-02-13 (×3): 100 mg via ORAL
  Filled 2013-02-12 (×4): qty 1

## 2013-02-12 MED ORDER — LORATADINE 10 MG PO TABS
10.0000 mg | ORAL_TABLET | Freq: Every day | ORAL | Status: DC
  Administered 2013-02-12 – 2013-02-13 (×2): 10 mg via ORAL
  Filled 2013-02-12 (×2): qty 1

## 2013-02-12 MED ORDER — LEVOTHYROXINE SODIUM 100 MCG PO TABS
100.0000 ug | ORAL_TABLET | Freq: Every day | ORAL | Status: DC
  Administered 2013-02-12: 100 ug via ORAL
  Filled 2013-02-12 (×2): qty 1

## 2013-02-12 MED ORDER — SPIRONOLACTONE 25 MG PO TABS
25.0000 mg | ORAL_TABLET | Freq: Two times a day (BID) | ORAL | Status: DC
  Administered 2013-02-12 – 2013-02-13 (×4): 25 mg via ORAL
  Filled 2013-02-12 (×5): qty 1

## 2013-02-12 MED ORDER — HYDROCHLOROTHIAZIDE 25 MG PO TABS
25.0000 mg | ORAL_TABLET | Freq: Every day | ORAL | Status: DC
  Filled 2013-02-12: qty 1

## 2013-02-12 MED ORDER — METOPROLOL TARTRATE 12.5 MG HALF TABLET
12.5000 mg | ORAL_TABLET | Freq: Two times a day (BID) | ORAL | Status: DC
  Administered 2013-02-12 – 2013-02-13 (×4): 12.5 mg via ORAL
  Filled 2013-02-12 (×5): qty 1

## 2013-02-12 MED ORDER — AMITRIPTYLINE HCL 25 MG PO TABS
25.0000 mg | ORAL_TABLET | Freq: Every day | ORAL | Status: DC
  Administered 2013-02-12 (×2): 25 mg via ORAL
  Filled 2013-02-12 (×3): qty 1

## 2013-02-12 MED ORDER — AMLODIPINE BESYLATE 10 MG PO TABS
10.0000 mg | ORAL_TABLET | Freq: Every day | ORAL | Status: DC
  Administered 2013-02-12 (×2): 10 mg via ORAL
  Filled 2013-02-12 (×3): qty 1

## 2013-02-12 MED ORDER — ONDANSETRON HCL 4 MG PO TABS: 4.0000 mg | ORAL_TABLET | Freq: Four times a day (QID) | ORAL | Status: DC | PRN

## 2013-02-12 MED ORDER — LISINOPRIL 40 MG PO TABS
40.0000 mg | ORAL_TABLET | Freq: Every day | ORAL | Status: DC
  Administered 2013-02-12 – 2013-02-13 (×2): 40 mg via ORAL
  Filled 2013-02-12 (×2): qty 1

## 2013-02-12 MED ORDER — HYDROCHLOROTHIAZIDE 25 MG PO TABS
25.0000 mg | ORAL_TABLET | Freq: Every day | ORAL | Status: DC
  Administered 2013-02-12 – 2013-02-13 (×2): 25 mg via ORAL
  Filled 2013-02-12 (×2): qty 1

## 2013-02-12 MED ORDER — LISINOPRIL 20 MG PO TABS
20.0000 mg | ORAL_TABLET | Freq: Every day | ORAL | Status: DC
  Filled 2013-02-12: qty 1

## 2013-02-12 MED ORDER — ZOLPIDEM TARTRATE 5 MG PO TABS
5.0000 mg | ORAL_TABLET | Freq: Every evening | ORAL | Status: DC | PRN
  Administered 2013-02-13: 5 mg via ORAL
  Filled 2013-02-12: qty 1

## 2013-02-12 MED ORDER — LISINOPRIL-HYDROCHLOROTHIAZIDE 20-25 MG PO TABS: 1.0000 | ORAL_TABLET | Freq: Every day | ORAL | Status: DC

## 2013-02-12 MED ORDER — VANCOMYCIN HCL IN DEXTROSE 1-5 GM/200ML-% IV SOLN
1000.0000 mg | Freq: Two times a day (BID) | INTRAVENOUS | Status: DC
  Administered 2013-02-12 – 2013-02-13 (×3): 1000 mg via INTRAVENOUS
  Filled 2013-02-12 (×4): qty 200

## 2013-02-12 MED ORDER — DIPHENHYDRAMINE HCL 50 MG/ML IJ SOLN
25.0000 mg | Freq: Four times a day (QID) | INTRAMUSCULAR | Status: DC | PRN
  Administered 2013-02-12: 25 mg via INTRAVENOUS
  Filled 2013-02-12: qty 1

## 2013-02-12 NOTE — Progress Notes (Signed)
ANTIBIOTIC CONSULT NOTE - INITIAL  Pharmacy Consult for vancomycin  Indication: cellulitis right forearm failed keflex  Allergies  Allergen Reactions  . Clonidine Derivatives Swelling    Facial swelling  . Aspirin Nausea Only    Patient Measurements: Height: 5\' 5"  (165.1 cm) Weight: 184 lb (83.462 kg) IBW/kg (Calculated) : 57 Adjusted Body Weight:   Vital Signs: Temp: 97.8 F (36.6 C) (08/19 0007) Temp src: Oral (08/19 0007) BP: 190/87 mmHg (08/19 0007) Pulse Rate: 51 (08/19 0007) Intake/Output from previous day:   Intake/Output from this shift:    Labs:  Recent Labs  02/11/13 1958 02/11/13 2020  WBC 8.4  --   HGB 12.7 13.3  PLT 287  --   CREATININE  --  0.80   Estimated Creatinine Clearance: 89.8 ml/min (by C-G formula based on Cr of 0.8). No results found for this basename: VANCOTROUGH, VANCOPEAK, VANCORANDOM, GENTTROUGH, GENTPEAK, GENTRANDOM, TOBRATROUGH, TOBRAPEAK, TOBRARND, AMIKACINPEAK, AMIKACINTROU, AMIKACIN,  in the last 72 hours   Microbiology: No results found for this or any previous visit (from the past 720 hour(s)).  Medical History: Past Medical History  Diagnosis Date  . Hypertension   . Chronic back pain   . Anemia   . Migraine headache   . Thyroid disease 2005    Graves  . Neck strain 02/06/2012    Medications:  Prescriptions prior to admission  Medication Sig Dispense Refill  . amitriptyline (ELAVIL) 25 MG tablet Take 1 tablet (25 mg total) by mouth at bedtime.  30 tablet  2  . amLODipine (NORVASC) 10 MG tablet Take 1 tablet (10 mg total) by mouth at bedtime.  90 tablet  3  . cephALEXin (KEFLEX) 500 MG capsule Take 500 mg by mouth 4 (four) times daily. For 7 days; Start date 02/08/13      . cetirizine (ZYRTEC) 10 MG tablet Take 1 tablet (10 mg total) by mouth daily.  90 tablet  3  . levothyroxine (SYNTHROID, LEVOTHROID) 100 MCG tablet Take 1 tablet (100 mcg total) by mouth every morning.  90 tablet  3  . lisinopril-hydrochlorothiazide  (PRINZIDE,ZESTORETIC) 20-25 MG per tablet Take 1 tablet by mouth daily.       . metoprolol tartrate (LOPRESSOR) 25 MG tablet Take 0.5 tablets (12.5 mg total) by mouth 2 (two) times daily.  90 tablet  3  . spironolactone (ALDACTONE) 25 MG tablet Take 25 mg by mouth 2 (two) times daily.       Assessment: Bitten on right forearm with subsequent cellulitits that failed keflex 500 qid x5 of 7 days. Now admitted for iv vancomycin. No systemic response  Goal of Therapy:  Vancomycin trough level 10-15 mcg/ml  Plan:  Vancomycin 1gm q12h  Trough before 4th dose.   Janice Coffin 02/12/2013,12:21 AM

## 2013-02-12 NOTE — Discharge Summary (Addendum)
Physician Discharge Summary  Teresa [name] UJW:[id number] DOB: [birth year] DOA: 02/11/2013  PCP: No PCP Per Patient  Admit date: 02/11/2013 Discharge date: 02/13/2013  Time spent: 30 minutes  Recommendations for Outpatient Follow-up:  1. Cellulitis; continue vancomycin 2. HTN uncontrolled on current medication regimen, and hypokalemic will increase lisinopril to 40 mg daily 3. Thyroid nodules; some tenderness to palpation bilateral lobes seen #4 4. Subacute Hypothyroidism; TSH= 12.60, free T4= 2.91 will start patient on daily --Would follow up on thyroid ultrasound secondary to bilateral thyroid nodules diagnosed 2013 --Order to antithyroid peroxidase, anti-thyroglobulin antibody R/O Hashimoto's 5. tobacco abuse; strongly counseled patient to be D/C smoking  6. Repeat renal panel in 1-2 weeks to eval potassium level  Procedure Thyroid ultrasound 08/31/2011 Focal nodules: 4 x 4 x 6 mm hypoechoic solid, mid-right  4 x 5 x 5 mm hypoechoic solid, superior left  Discharge Diagnoses:  Principal Problem:   Cellulitis and abscess of upper arm and forearm Active Problems:   Tobacco abuse   Hypertension   Hypothyroidism   Thyroid nodule  Discharge Condition:  Stable  Diet recommendation:  Regular  Filed Weights   02/12/13 0007 02/12/13 2038  Weight: 83.462 kg (184 lb) 83.598 kg (184 lb 4.8 oz)    History of present illness:  Teresa [name] 52 y.o. BF PMHx HTN, Grave's disease, anemia, hx of migraine, presents to the ER as her right arm redness and swelling has increased since she has been taken her Keflex. She said she thought she was bitten by something on her right forearm, and it has been tender there. She has been placed on Keflex, and it has been five days since the redness increase and spread proximally. There has been no fever, chills, or any systemic symptoms. She has no leukocytosis, and her labs were unremarkable. Hospitalist was asked to admit her for cellulitis not  responding to outpatient oral antibiotic.   Antibiotic  Vancomycin 02/11/2013 day 2  Hospital Course: The patient was admitted to the floor. IV vancomycin was started. The patient remained without leukocytosis or fevers. The area of erythema had improved markedly. The patient was otherwise stable for discharge home. Since the patient did not respond to keflex, will d/c with empiric doxy.  Discharge Exam: Filed Vitals:   02/12/13 1743 02/12/13 2038 02/13/13 0554 02/13/13 1000  BP: 136/76 149/76 130/75 138/76  Pulse: 78 58 60 66  Temp: 98.6 F (37 C) 98.4 F (36.9 C) 98.3 F (36.8 C) 98.5 F (36.9 C)  TempSrc: Oral Oral Oral Oral  Resp: 18 18 18 18   Height:  5\' 5"  (1.651 m)    Weight:  83.598 kg (184 lb 4.8 oz)    SpO2: 100% 98% 96% 98%    General:Alert, NAD Cardiovascular: Regular rhythm and rate, negative murmurs rubs gallops, DP/PT pulse 2+ bilateral Respiratory: Clear to auscultation bilaterally ENT; bilateral thyroid lobes/inferior thyroid lobe both diffusely tender, unable to palpate nodules  Discharge Instructions     Medication List    STOP taking these medications       cephALEXin 500 MG capsule  Commonly known as:  KEFLEX      TAKE these medications       amitriptyline 25 MG tablet  Commonly known as:  ELAVIL  Take 1 tablet (25 mg total) by mouth at bedtime.     amLODipine 10 MG tablet  Commonly known as:  NORVASC  Take 1 tablet (10 mg total) by mouth at bedtime.     cetirizine 10  MG tablet  Commonly known as:  ZYRTEC  Take 1 tablet (10 mg total) by mouth daily.     doxycycline 100 MG capsule  Commonly known as:  VIBRAMYCIN  Take 1 capsule (100 mg total) by mouth 2 (two) times daily.     levothyroxine 100 MCG tablet  Commonly known as:  SYNTHROID, LEVOTHROID  Take 1 tablet (100 mcg total) by mouth every morning.     lisinopril-hydrochlorothiazide 20-25 MG per tablet  Commonly known as:  PRINZIDE,ZESTORETIC  Take 1 tablet by mouth daily.      metoprolol tartrate 25 MG tablet  Commonly known as:  LOPRESSOR  Take 0.5 tablets (12.5 mg total) by mouth 2 (two) times daily.     potassium chloride SA 20 MEQ tablet  Commonly known as:  K-DUR,KLOR-CON  Take 2 tablets (40 mEq total) by mouth daily.     spironolactone 25 MG tablet  Commonly known as:  ALDACTONE  Take 25 mg by mouth 2 (two) times daily.       Allergies  Allergen Reactions  . Clonidine Derivatives Swelling    Facial swelling  . Aspirin Nausea Only      The results of significant diagnostics from this hospitalization (including imaging, microbiology, ancillary and laboratory) are listed below for reference.    Significant Diagnostic Studies: No results found.  Microbiology: No results found for this or any previous visit (from the past 240 hour(s)).   Labs: Basic Metabolic Panel:  Recent Labs Lab 02/11/13 2020  NA 140  K 3.2*  CL 105  GLUCOSE 96  BUN 11  CREATININE 0.80   Liver Function Tests: No results found for this basename: AST, ALT, ALKPHOS, BILITOT, PROT, ALBUMIN,  in the last 168 hours No results found for this basename: LIPASE, AMYLASE,  in the last 168 hours No results found for this basename: AMMONIA,  in the last 168 hours CBC:  Recent Labs Lab 02/11/13 1958 02/11/13 2020  WBC 8.4  --   NEUTROABS 4.3  --   HGB 12.7 13.3  HCT 36.8 39.0  MCV 81.2  --   PLT 287  --    Cardiac Enzymes: No results found for this basename: CKTOTAL, CKMB, CKMBINDEX, TROPONINI,  in the last 168 hours BNP: BNP (last 3 results) No results found for this basename: PROBNP,  in the last 8760 hours CBG: No results found for this basename: GLUCAP,  in the last 168 hours     Signed:  Nesiah Jump K  [facility] 02/13/2013, 1:05 PM

## 2013-02-12 NOTE — ED Provider Notes (Signed)
Medical screening examination/treatment/procedure(s) were performed by non-physician practitioner and as supervising physician I was immediately available for consultation/collaboration.   Junius Argyle, MD 02/12/13 [phone]

## 2013-02-13 ENCOUNTER — Inpatient Hospital Stay (HOSPITAL_COMMUNITY): Payer: MEDICAID

## 2013-02-13 LAB — THYROID PEROXIDASE ANTIBODY: Thyroperoxidase Ab SerPl-aCnc: 15.3 IU/mL (ref <35.0)

## 2013-02-13 LAB — THYROGLOBULIN ANTIBODY: Thyroglobulin Ab: <20 U/mL (ref <40.0)

## 2013-02-13 MED ORDER — DOXYCYCLINE HYCLATE 100 MG PO CAPS: 100.0000 mg | ORAL_CAPSULE | Freq: Two times a day (BID) | ORAL | Status: DC

## 2013-02-13 MED ORDER — SULFAMETHOXAZOLE-TMP DS 800-160 MG PO TABS: 1.0000 | ORAL_TABLET | Freq: Two times a day (BID) | ORAL | Status: DC

## 2013-02-13 MED ORDER — POTASSIUM CHLORIDE CRYS ER 20 MEQ PO TBCR: 40.0000 meq | EXTENDED_RELEASE_TABLET | Freq: Every day | ORAL | Status: DC

## 2013-02-13 NOTE — Progress Notes (Signed)
Talked to patient about follow up medical care; patient stated that her PCP is Dr Delbert Harness with [facility] and plans go making an appointment with the MD after discharge. Prescriptions are filled at [facility] on [address], [place]; patient stated that she is able to get her prescription filled at discharge. Abelino Derrick RN,BSN,MHA

## 2013-02-13 NOTE — Progress Notes (Signed)
Patient discharged home.  IV removed.  Educated on discharge instructions.  Patient notified of new medications and how to take medications.  Patient verbalized return of all belongings.  Social worker gave patient paper to obtain medications at [facility] pharmacy.  Patient ambulatory, steady gait, no pain medications or driving restrictions, able to drive self home.

## 2013-03-06 ENCOUNTER — Ambulatory Visit: Payer: Self-pay

## 2013-05-02 ENCOUNTER — Other Ambulatory Visit: Payer: Self-pay

## 2013-05-28 ENCOUNTER — Ambulatory Visit: Payer: Self-pay | Admitting: Family Medicine

## 2013-05-30 ENCOUNTER — Ambulatory Visit (INDEPENDENT_AMBULATORY_CARE_PROVIDER_SITE_OTHER): Payer: Self-pay | Admitting: Family Medicine

## 2013-05-30 ENCOUNTER — Encounter: Payer: Self-pay | Admitting: Family Medicine

## 2013-05-30 VITALS — BP 191/98 | HR 64 | Temp 98.9°F | Wt 184.0 lb

## 2013-05-30 DIAGNOSIS — Z23 Encounter for immunization: Secondary | ICD-10-CM

## 2013-05-30 DIAGNOSIS — E039 Hypothyroidism, unspecified: Secondary | ICD-10-CM

## 2013-05-30 DIAGNOSIS — I10 Essential (primary) hypertension: Secondary | ICD-10-CM

## 2013-05-30 MED ORDER — METOPROLOL TARTRATE 25 MG PO TABS: 12.5000 mg | ORAL_TABLET | Freq: Two times a day (BID) | ORAL | Status: DC

## 2013-05-30 MED ORDER — LISINOPRIL-HYDROCHLOROTHIAZIDE 20-25 MG PO TABS: 1.0000 | ORAL_TABLET | Freq: Every day | ORAL | Status: DC

## 2013-05-30 MED ORDER — SPIRONOLACTONE 25 MG PO TABS: 25.0000 mg | ORAL_TABLET | Freq: Two times a day (BID) | ORAL | Status: DC

## 2013-05-30 MED ORDER — AMLODIPINE BESYLATE 10 MG PO TABS: 10.0000 mg | ORAL_TABLET | Freq: Every day | ORAL | Status: DC

## 2013-05-30 NOTE — Assessment & Plan Note (Signed)
Patient reports being out of Synthroid for one month - explained the serious consequences of hypothyroidism with patient - She reports she will go directly to the pharmacy and get Synthroid after her visit today - Schedule appointment with PCP in 2 weeks consider checking TSH at that time

## 2013-05-30 NOTE — Progress Notes (Signed)
  Patient name: Teresa [name] MRN [id number]  Date of birth: [birth year]  CC & HPI:  Teresa [name] is a 52 y.o. female presnting today for for blood pressure check.  She reports being out of most of her blood pressure medications and thyroid medications for one month.  Reports periodic headaches over the course of the month that were consistent with previous migraine, but denies chest pain, shortness of breath, or lower extremity edema.  She reports taking her metoprolol today, but needs refills on all other medications.  She reports a plan for our chart in the past, but was told that she made too much money at that time.  He denies having any financial limitations preventing her from getting her medications currently.  But will see financial counselor at the health department to discuss medication assistance programs. He reports doing like her throat is swelling, but denies trouble breathing or swallowing.  She also endorses weight gain, and feeling pressure behind her eye, but has been told that no ophthalmologist takes orange-card.  She has questions about whether thyroid surgery would be of benefit to her.  ROS: See HPI above otherwise negative.  Medical & Surgical Hx:  Reviewed.  Medications & Allergies: Reviewed & Updated - see associated section Social History: Reviewed:   Objective Findings:  Vitals: BP 191/98  Pulse 64  Temp(Src) 98.9 F (37.2 C) (Oral)  Wt 184 lb (83.462 kg)  Gen: NAD, Obese Neck: Supple & nontender; Trachea midline; Thyroid normal; No lymph nodes palpated  CV: RRR w/o m/r/g, pulses +2 b/l Resp: CTAB w/ normal respiratory effort Lower Ext: No skin changes; No edema; distal pulses intact; Calves nontender  Assessment & Plan:   Please See Problem Focused Assessment & Plan

## 2013-05-30 NOTE — Assessment & Plan Note (Signed)
Hypertensive urgency today BP 191/98 w/ HA; denies chest pain, shortness of breath, or vision changes - reports being out of BP medications for one month, but took metoprolol today - Unable to give clonidine today due to allergies - patient chooses to go to pharmacy and refill BP meds after her appointment today - schedule follow-up appointment with PCP in 2 weeks for recheck

## 2013-05-30 NOTE — Patient Instructions (Addendum)
It was great seeing you today.   1. Restart your Synthroid as soon as possible 2. Restart your Lisinopril-Hydrochlorothiazide and Spironolactone as soon as possible 3. Restart you Amlodipine next week 4. Make apt with Dr Lum Babe in 2 weeks for BP check: Make apt for morning and do not eat the morning of your visit   Please bring all your medications to every doctors visit  Sign up for My Chart to have easy access to your labs results, and communication with your Primary care physician.   Please check-out at the front desk before leaving the clinic.   I look forward to talking with you again at our next visit. If you have any questions or concerns before then, please call the clinic at [phone].  Take Care,   Dr Wenda Low

## 2013-06-10 ENCOUNTER — Ambulatory Visit: Payer: Self-pay | Admitting: Family Medicine

## 2013-07-19 ENCOUNTER — Telehealth: Payer: Self-pay | Admitting: Family Medicine

## 2013-07-19 ENCOUNTER — Encounter: Payer: Self-pay | Admitting: Family Medicine

## 2013-07-19 ENCOUNTER — Ambulatory Visit (INDEPENDENT_AMBULATORY_CARE_PROVIDER_SITE_OTHER): Payer: Self-pay | Admitting: Family Medicine

## 2013-07-19 ENCOUNTER — Ambulatory Visit: Payer: Self-pay | Admitting: Family Medicine

## 2013-07-19 VITALS — BP 159/90 | HR 67 | Temp 98.2°F | Ht 65.0 in | Wt 185.0 lb

## 2013-07-19 DIAGNOSIS — M25569 Pain in unspecified knee: Secondary | ICD-10-CM

## 2013-07-19 DIAGNOSIS — H1010 Acute atopic conjunctivitis, unspecified eye: Secondary | ICD-10-CM | POA: Insufficient documentation

## 2013-07-19 DIAGNOSIS — E039 Hypothyroidism, unspecified: Secondary | ICD-10-CM

## 2013-07-19 DIAGNOSIS — J309 Allergic rhinitis, unspecified: Secondary | ICD-10-CM

## 2013-07-19 LAB — TSH: TSH: 0.892 u[IU]/mL (ref 0.350–4.500)

## 2013-07-19 MED ORDER — TRIAMCINOLONE ACETONIDE 55 MCG/ACT NA AERO: 2.0000 | INHALATION_SPRAY | Freq: Every day | NASAL | Status: DC

## 2013-07-19 MED ORDER — ACETAMINOPHEN 325 MG PO TABS: 650.0000 mg | ORAL_TABLET | Freq: Four times a day (QID) | ORAL | Status: DC

## 2013-07-19 MED ORDER — CETIRIZINE HCL 10 MG PO TABS: 10.0000 mg | ORAL_TABLET | Freq: Every day | ORAL | Status: DC

## 2013-07-19 NOTE — Assessment & Plan Note (Signed)
Check TSH today and adjust meds accordingly.

## 2013-07-19 NOTE — Assessment & Plan Note (Signed)
Likely bursitis/tendonitis.  Needs NSAIDS--will try tylenol.  If not helpful, could try very short steroid burst.

## 2013-07-19 NOTE — Progress Notes (Signed)
   Subjective:    Patient ID: [name], female    DOB: 1960-08-19, 53 y.o.   MRN: [id number]  Knee Pain    Fell at apartment complex on stairs about 4 wks ago.  Still having pain on the outside portion of knee.  Tried ice, heat.  Can not take NSAIDS secondary to GI upset.  Has knee brace.  Working on [employer].  Works as a Theatre manager. Needs TSH repeated.  Was elevated in 8/14 and then had dosage increased of Synthroid.  She was unable to return for further testing.   Review of Systems  Constitutional: Negative for fever and fatigue.  Respiratory: Negative for shortness of breath and wheezing.   Cardiovascular: Negative for chest pain.  Gastrointestinal: Negative for abdominal pain.       Objective:   Physical Exam  Vitals reviewed. Constitutional: She is oriented to person, place, and time. She appears well-developed and well-nourished.  HENT:  Head: Normocephalic and atraumatic.  Eyes: No scleral icterus.  Neck: Neck supple.  Cardiovascular: Normal rate and regular rhythm.   Pulmonary/Chest: Effort normal. She has no wheezes.  Abdominal: There is no tenderness.  Musculoskeletal: She exhibits tenderness.  Lateral aspect of left lower leg, below knee joint. There is point tenderness.  Neurological: She is alert and oriented to person, place, and time.          Assessment & Plan:

## 2013-07-19 NOTE — Telephone Encounter (Signed)
Pt called and the Nasacort is too expensive. She would like if you could find something on the 4 dollar list she can get since she does not have any insurance. jw

## 2013-07-19 NOTE — Patient Instructions (Signed)
Knee Pain Knee pain can be a result of an injury or other medical conditions. Treatment will depend on the cause of your pain. HOME CARE  Only take medicine as told by your doctor.  Keep a healthy weight. Being overweight can make the knee hurt more.  Stretch before exercising or playing sports.  If there is constant knee pain, change the way you exercise. Ask your doctor for advice.  Make sure shoes fit well. Choose the right shoe for the sport or activity.  Protect your knees. Wear kneepads if needed.  Rest when you are tired. GET HELP RIGHT AWAY IF:   Your knee pain does not stop.  Your knee pain does not get better.  Your knee joint feels hot to the touch.  You have a fever. MAKE SURE YOU:   Understand these instructions.  Will watch this condition.  Will get help right away if you are not doing well or get worse. Document Released: 09/09/2008 Document Revised: 09/05/2011 Document Reviewed: 09/09/2008 [facility] Patient Information 2014 [place], [place]. Allergies Allergies may happen from anything your body is sensitive to. This may be food, medicines, pollens, chemicals, and nearly anything around you in everyday life that produces allergens. An allergen is anything that causes an allergy producing substance. Heredity is often a factor in causing these problems. This means you may have some of the same allergies as your parents. Food allergies happen in all age groups. Food allergies are some of the most severe and life threatening. Some common food allergies are cow's milk, seafood, eggs, nuts, wheat, and soybeans. SYMPTOMS   Swelling around the mouth.  An itchy red rash or hives.  Vomiting or diarrhea.  Difficulty breathing. SEVERE ALLERGIC REACTIONS ARE LIFE-THREATENING. This reaction is called anaphylaxis. It can cause the mouth and throat to swell and cause difficulty with breathing and swallowing. In severe reactions only a trace amount of food (for example,  peanut oil in a salad) may cause death within seconds. Seasonal allergies occur in all age groups. These are seasonal because they usually occur during the same season every year. They may be a reaction to molds, grass pollens, or tree pollens. Other causes of problems are house dust mite allergens, pet dander, and mold spores. The symptoms often consist of nasal congestion, a runny itchy nose associated with sneezing, and tearing itchy eyes. There is often an associated itching of the mouth and ears. The problems happen when you come in contact with pollens and other allergens. Allergens are the particles in the air that the body reacts to with an allergic reaction. This causes you to release allergic antibodies. Through a chain of events, these eventually cause you to release histamine into the blood stream. Although it is meant to be protective to the body, it is this release that causes your discomfort. This is why you were given anti-histamines to feel better. If you are unable to pinpoint the offending allergen, it may be determined by skin or blood testing. Allergies cannot be cured but can be controlled with medicine. Hay fever is a collection of all or some of the seasonal allergy problems. It may often be treated with simple over-the-counter medicine such as diphenhydramine. Take medicine as directed. Do not drink alcohol or drive while taking this medicine. Check with your caregiver or package insert for child dosages. If these medicines are not effective, there are many new medicines your caregiver can prescribe. Stronger medicine such as nasal spray, eye drops, and corticosteroids may be  used if the first things you try do not work well. Other treatments such as immunotherapy or desensitizing injections can be used if all else fails. Follow up with your caregiver if problems continue. These seasonal allergies are usually not life threatening. They are generally more of a nuisance that can often be  handled using medicine. HOME CARE INSTRUCTIONS   If unsure what causes a reaction, keep a diary of foods eaten and symptoms that follow. Avoid foods that cause reactions.  If hives or rash are present:  Take medicine as directed.  You may use an over-the-counter antihistamine (diphenhydramine) for hives and itching as needed.  Apply cold compresses (cloths) to the skin or take baths in cool water. Avoid hot baths or showers. Heat will make a rash and itching worse.  If you are severely allergic:  Following a treatment for a severe reaction, hospitalization is often required for closer follow-up.  Wear a medic-alert bracelet or necklace stating the allergy.  You and your family must learn how to give adrenaline or use an anaphylaxis kit.  If you have had a severe reaction, always carry your anaphylaxis kit or EpiPen with you. Use this medicine as directed by your caregiver if a severe reaction is occurring. Failure to do so could have a fatal outcome. SEEK MEDICAL CARE IF:  You suspect a food allergy. Symptoms generally happen within 30 minutes of eating a food.  Your symptoms have not gone away within 2 days or are getting worse.  You develop new symptoms.  You want to retest yourself or your child with a food or drink you think causes an allergic reaction. Never do this if an anaphylactic reaction to that food or drink has happened before. Only do this under the care of a caregiver. SEEK IMMEDIATE MEDICAL CARE IF:   You have difficulty breathing, are wheezing, or have a tight feeling in your chest or throat.  You have a swollen mouth, or you have hives, swelling, or itching all over your body.  You have had a severe reaction that has responded to your anaphylaxis kit or an EpiPen. These reactions may return when the medicine has worn off. These reactions should be considered life threatening. MAKE SURE YOU:   Understand these instructions.  Will watch your  condition.  Will get help right away if you are not doing well or get worse. Document Released: 09/06/2002 Document Revised: 10/08/2012 Document Reviewed: 02/11/2008 [facility] Patient Information 2014 [place].

## 2013-07-23 NOTE — Telephone Encounter (Signed)
Patient informed of message from MD, expressed understanding. 

## 2013-07-23 NOTE — Telephone Encounter (Signed)
Please inform patient none of the nasal allergy medicine is on $4 list at [facility],not sure it will be at any place. Here are the prices without insurance: Nasacort Rx >$100 Nasonex $158.22 Flonase $106.29  Alternative that might be cheaper without insurance: Nasacort OTC: She can ask the pharmacy for over the counter Nasacort allergy 24hr. Benedryl allergy oral. Allegra oral.

## 2013-07-23 NOTE — Telephone Encounter (Signed)
LMOVM for pt to return call .Jordy Hewins Dawn  

## 2013-07-28 ENCOUNTER — Emergency Department (HOSPITAL_COMMUNITY): Payer: Self-pay

## 2013-07-28 ENCOUNTER — Encounter (HOSPITAL_COMMUNITY): Payer: Self-pay | Admitting: Emergency Medicine

## 2013-07-28 ENCOUNTER — Emergency Department (HOSPITAL_COMMUNITY)
Admission: EM | Admit: 2013-07-28 | Discharge: 2013-07-28 | Disposition: A | Discharge status: Home / Self Care | Payer: Self-pay | Attending: Emergency Medicine | Admitting: Emergency Medicine

## 2013-07-28 DIAGNOSIS — Z79899 Other long term (current) drug therapy: Secondary | ICD-10-CM | POA: Insufficient documentation

## 2013-07-28 DIAGNOSIS — IMO0002 Reserved for concepts with insufficient information to code with codable children: Secondary | ICD-10-CM | POA: Insufficient documentation

## 2013-07-28 DIAGNOSIS — Z862 Personal history of diseases of the blood and blood-forming organs and certain disorders involving the immune mechanism: Secondary | ICD-10-CM | POA: Insufficient documentation

## 2013-07-28 DIAGNOSIS — I10 Essential (primary) hypertension: Secondary | ICD-10-CM | POA: Insufficient documentation

## 2013-07-28 DIAGNOSIS — M25579 Pain in unspecified ankle and joints of unspecified foot: Secondary | ICD-10-CM | POA: Insufficient documentation

## 2013-07-28 DIAGNOSIS — F17200 Nicotine dependence, unspecified, uncomplicated: Secondary | ICD-10-CM | POA: Insufficient documentation

## 2013-07-28 DIAGNOSIS — G8929 Other chronic pain: Secondary | ICD-10-CM | POA: Insufficient documentation

## 2013-07-28 DIAGNOSIS — Z8719 Personal history of other diseases of the digestive system: Secondary | ICD-10-CM | POA: Insufficient documentation

## 2013-07-28 DIAGNOSIS — Z8639 Personal history of other endocrine, nutritional and metabolic disease: Secondary | ICD-10-CM | POA: Insufficient documentation

## 2013-07-28 DIAGNOSIS — G8911 Acute pain due to trauma: Secondary | ICD-10-CM | POA: Insufficient documentation

## 2013-07-28 DIAGNOSIS — M25569 Pain in unspecified knee: Secondary | ICD-10-CM | POA: Insufficient documentation

## 2013-07-28 DIAGNOSIS — G43909 Migraine, unspecified, not intractable, without status migrainosus: Secondary | ICD-10-CM | POA: Insufficient documentation

## 2013-07-28 MED ORDER — HYDROCODONE-ACETAMINOPHEN 5-325 MG PO TABS: 1.0000 | ORAL_TABLET | Freq: Four times a day (QID) | ORAL | Status: DC | PRN

## 2013-07-28 MED ORDER — HYDROCODONE-ACETAMINOPHEN 5-325 MG PO TABS
2.0000 | ORAL_TABLET | Freq: Once | ORAL | Status: AC
  Administered 2013-07-28: 2 via ORAL
  Filled 2013-07-28: qty 2

## 2013-07-28 MED ORDER — ONDANSETRON 4 MG PO TBDP
4.0000 mg | ORAL_TABLET | Freq: Once | ORAL | Status: AC
  Administered 2013-07-28: 4 mg via ORAL
  Filled 2013-07-28: qty 1

## 2013-07-28 NOTE — ED Notes (Signed)
EDP at bedside. Doppler screen in progress

## 2013-07-28 NOTE — ED Provider Notes (Signed)
CSN: [id number]     Arrival date & time 07/28/13  1030 History   First MD Initiated Contact with Patient 07/28/13 1043     Chief Complaint  Patient presents with  . Ankle Pain    l/ankle pain x 2 month  . Leg Pain    pain from midcalf to thigh  . Fall    fell 2 months ago   (Consider location/radiation/quality/duration/timing/severity/associated sxs/prior Treatment) HPI This is a 53 year old female who presents emergency department chief complaint of severe left leg pain.  The patient had a fall off of 4 stairs approximately 2 months ago.  She was seen by Dr.  Darron Doom one month ago for the same complaint of severe pain.  On physical exam and that visit she had point tenderness over the left lateral aspect of the knee.  Patient states he has had continued pain and been using Tylenol however the pain has severely worsened over the past day.  She complains that the pain is radiating up her leg and her ankle is also painful.  She complains of swelling in the left ankle. Patient denies heat, redness, swelling in the knee joint.  She has pain with ambulation.  She states her pain is worse after sitting still for a long time and getting up.  Denies fevers, chills, myalgias, arthralgias. Denies DOE, SOB, chest tightness or pressure, radiation to left arm, jaw or back, or diaphoresis. Denies dysuria, flank pain, suprapubic pain, frequency, urgency, or hematuria. Denies headaches, light headedness, weakness, visual disturbances. Denies abdominal pain, nausea, vomiting, diarrhea or constipation.    Past Medical History  Diagnosis Date  . Hypertension   . Anemia   . Neck strain 02/06/2012  . Graves' disease   . Positive TB test   . H/O hiatal hernia   . Migraine headache     "couple times/month" (02/12/2013)  . Chronic lower back pain    Past Surgical History  Procedure Laterality Date  . Cesarean section  1979 1986 1990  . Appendectomy  2006  . Abdominal hysterectomy  2006   Family  History  Problem Relation Age of Onset  . Heart disease Other   . Diabetes Other   . Heart disease Mother     CAB age 11  . Diabetes Mother   . Cancer Father 26    colon cancer  . Stroke Father   . Diabetes Sister   . Kidney disease Sister     renal failure  . Diabetes Brother   . Kidney disease Brother     renal failure   History  Substance Use Topics  . Smoking status: Current Every Day Smoker -- 0.25 packs/day for 30 years    Types: Cigarettes  . Smokeless tobacco: Never Used  . Alcohol Use: No   OB History   Grav Para Term Preterm Abortions TAB SAB Ect Mult Living                 Review of Systems Ten systems reviewed and are negative for acute change, except as noted in the HPI.   Allergies  Clonidine derivatives and Aspirin  Home Medications   Current Outpatient Rx  Name  Route  Sig  Dispense  Refill  . acetaminophen (TYLENOL) 325 MG tablet   Oral   Take 2 tablets (650 mg total) by mouth every 6 (six) hours.   30 tablet   1   . amLODipine (NORVASC) 10 MG tablet   Oral   Take  1 tablet (10 mg total) by mouth at bedtime.   90 tablet   3   . cetirizine (ZYRTEC) 10 MG tablet   Oral   Take 1 tablet (10 mg total) by mouth daily.   90 tablet   3   . levothyroxine (SYNTHROID, LEVOTHROID) 100 MCG tablet   Oral   Take 1 tablet (100 mcg total) by mouth every morning.   90 tablet   3   . lisinopril-hydrochlorothiazide (PRINZIDE,ZESTORETIC) 20-25 MG per tablet   Oral   Take 1 tablet by mouth daily.   30 tablet   3   . metoprolol tartrate (LOPRESSOR) 25 MG tablet   Oral   Take 0.5 tablets (12.5 mg total) by mouth 2 (two) times daily.   90 tablet   3   . spironolactone (ALDACTONE) 25 MG tablet   Oral   Take 1 tablet (25 mg total) by mouth 2 (two) times daily.   30 tablet   3   . triamcinolone (NASACORT AQ) 55 MCG/ACT AERO nasal inhaler   Nasal   Place 2 sprays into the nose daily.   1 Inhaler   12    BP 140/79  Pulse 74  Temp(Src) 97.9  F (36.6 C) (Oral)  Resp 16  SpO2 98% Physical Exam Physical Exam  Nursing note and vitals reviewed. Constitutional: She is oriented to person, place, and time. She appears well-developed and well-nourished.  Smells of cigarettes.  Patient is tearful. HENT:  Head: Normocephalic and atraumatic.  Eyes: Conjunctivae normal and EOM are normal. Pupils are equal, round, and reactive to light. No scleral icterus.  Neck: Normal range of motion.  Cardiovascular: Normal rate, regular rhythm and normal heart sounds.  Exam reveals no gallop and no friction rub.   No murmur heard. Pulmonary/Chest: Effort normal and breath sounds normal. No respiratory distress.  Abdominal: Soft. Bowel sounds are normal. She exhibits no distension and no mass. There is no tenderness. There is no guarding.  Musculoskeletal: Left ankle with nonpitting edema over the left lateral malleolus.  It is equal in swelling to the right ankle.  Does not appear that there is any difference in size between the left calf the right calf.  Distal pulses are strong.  Range of motion and strength are maintained in the left ankle.  No ligamentous instability.  Sensation is intact.  Patient has full range of motion of the left knee.  She has exquisite point tenderness over the left iliotibial band and surgeon.  No swelling crepitus deformity or ligamentous instability of the left leg noted.  Patient has full range of motion and strength of the left hip.  She has pain with range of motion.  Gait assessment is referred to pain at this time.   Neurological: She is alert and oriented to person, place, and time.  Skin: Skin is warm and dry. She is not diaphoretic.    ED Course  Procedures (including critical care time) Labs Review Labs Reviewed - No data to display Imaging Review No results found.  EKG Interpretation   None       MDM   1. Lateral knee pain    Patient with severe pain in the left leg.  Films are being taken of the  ankle knee and hip.  To rule out any hip issues causing radiating pain to the knee.  I suspect iliotibial band dysfunction versus bursitis.   Patient seen in shared visit with Dr. Darl Householder. Beside US performed (see Dr. Kathleen Lime  note)/ no bursitis effusions noted. Patient will be given pain meds./ advised to follow  with Ortho.   Margarita Mail, PA-C 07/28/13 2125

## 2013-07-28 NOTE — Discharge Instructions (Signed)
Do not drive, operate heavy machinery, drink alcohol, or take other tylenol containing products with Norco. Follow up with the orthopedist for continued knee pain   Knee Pain Knee pain can be a result of an injury or other medical conditions. Treatment will depend on the cause of your pain. HOME CARE  Only take medicine as told by your doctor.  Keep a healthy weight. Being overweight can make the knee hurt more.  Stretch before exercising or playing sports.  If there is constant knee pain, change the way you exercise. Ask your doctor for advice.  Make sure shoes fit well. Choose the right shoe for the sport or activity.  Protect your knees. Wear kneepads if needed.  Rest when you are tired. GET HELP RIGHT AWAY IF:   Your knee pain does not stop.  Your knee pain does not get better.  Your knee joint feels hot to the touch.  You have a fever. MAKE SURE YOU:   Understand these instructions.  Will watch this condition.  Will get help right away if you are not doing well or get worse. Document Released: 09/09/2008 Document Revised: 09/05/2011 Document Reviewed: 09/09/2008 [facility] Patient Information 2014 [place], [place].

## 2013-07-28 NOTE — ED Notes (Signed)
Pt reports pain in l/ankle and upper  l/leg x 2 months. Seen by PCP last week. No xrays completed by PCP. Pt reports increased pain while walking today.

## 2013-07-29 NOTE — ED Provider Notes (Signed)
Medical screening examination/treatment/procedure(s) were conducted as a shared visit with non-physician practitioner(s) and myself.  I personally evaluated the patient during the encounter.  EKG Interpretation   None       Teresa [name] is a 53 y.o. female hx of gout here with L leg pain. L leg pain for the last several months. She noticed that her L ankle and knee was more swollen since yesterday. On exam, minimally swollen L knee and L ankle. No calf tenderness. Joints were not red and hot and she had full range of motion. xrays showed no fracture and she had no trama. I was suspicious for gout flare vs iliotibial band dysfunction vs bursitis. I performed bedside US below to look for knee effusion. There is small knee effusion, likely physiologic. Too small for an arthrocentesis. She was given steroids, pain meds empirically for gout.    EMERGENCY DEPARTMENT US SOFT TISSUE INTERPRETATION "Study: Limited Ultrasound of the noted body part in comments below"  INDICATIONS: Pain Multiple views of the body part are obtained with a multi-frequency linear probe  PERFORMED BY:  Myself  IMAGES ARCHIVED?: Yes  SIDE:Left  BODY PART:Other soft tisse (comment in note)  FINDINGS: Other small knee effusion, no obvious loculations  LIMITATIONS:  Emergent Procedure  INTERPRETATION:  No cellulitis noted  COMMENT:  L knee US performed, small effusion, likely physiologic. Too small for arthrocentesis     Wandra Arthurs, MD 07/29/13 [id number]

## 2013-08-06 ENCOUNTER — Encounter: Payer: Self-pay | Admitting: Family Medicine

## 2013-08-06 ENCOUNTER — Ambulatory Visit (INDEPENDENT_AMBULATORY_CARE_PROVIDER_SITE_OTHER): Payer: Self-pay | Admitting: Family Medicine

## 2013-08-06 VITALS — BP 152/79 | HR 64 | Temp 98.8°F | Resp 20 | Wt 179.0 lb

## 2013-08-06 DIAGNOSIS — M25562 Pain in left knee: Secondary | ICD-10-CM | POA: Insufficient documentation

## 2013-08-06 DIAGNOSIS — I10 Essential (primary) hypertension: Secondary | ICD-10-CM

## 2013-08-06 DIAGNOSIS — M25569 Pain in unspecified knee: Secondary | ICD-10-CM

## 2013-08-06 HISTORY — DX: Pain in left knee: M25.562

## 2013-08-06 MED ORDER — METHYLPREDNISOLONE ACETATE 40 MG/ML IJ SUSP
40.0000 mg | Freq: Once | INTRAMUSCULAR | Status: AC
  Administered 2013-08-06: 40 mg via INTRA_ARTICULAR

## 2013-08-06 NOTE — Assessment & Plan Note (Signed)
BP elevated today despite normal values at home. This might be due to her pain. I rechecked her BP with some improvement. Continue current BP regimen.

## 2013-08-06 NOTE — Assessment & Plan Note (Addendum)
I reviewed her xray of the left knee done few weeks ago which was normal. Etiology of pain might be traumatic from previous fall. Intraarticular depo-medrol + Lidocaine injection was performed under sterile condition after obtaining written and informed consent. Patient felt immediate relieve after procedure. Ace bandage was applied to her left knee and I recommended knee brace. Continue Narco prn pain for now. I referred her for PT therapy, if no improvement  As discussed with patient I would obtain knee MRI.  More than 30 min spent on face to face encounter,knee joint injection and coordination of care. If worsening pain she is advised to proceed to the ED.

## 2013-08-06 NOTE — Progress Notes (Signed)
Subjective:     Patient ID: Teresa [name], female   DOB: September 06, 1960, 53 y.o.   MRN: [id number]  HPI Knee pain: Here for worsening left knee pain which started after she took a fall in Nov 2014, yesterday while walking on a flat surface at home she heard a pop sound on her left knee with her knee giving way, since then she has had pain about 7/10 in severity, she uses Norco with minimal improvement. Denies any recent trauma to her left knee.ssively worsening..Pain is worse with movement. Se would like to try hydrocortisone shot which she had in the past for her back pain.She feels like her knee is a little swollen. HTN: Compliant with her medications,last dose was prior to her visit to the clinic today. Her home BP has been normal. Current Outpatient Prescriptions on File Prior to Visit  Medication Sig Dispense Refill  . amLODipine (NORVASC) 10 MG tablet Take 1 tablet (10 mg total) by mouth at bedtime.  90 tablet  3  . HYDROcodone-acetaminophen (NORCO) 5-325 MG per tablet Take 1-2 tablets by mouth every 6 (six) hours as needed for moderate pain.  20 tablet  0  . levothyroxine (SYNTHROID, LEVOTHROID) 100 MCG tablet Take 1 tablet (100 mcg total) by mouth every morning.  90 tablet  3  . lisinopril-hydrochlorothiazide (PRINZIDE,ZESTORETIC) 20-25 MG per tablet Take 1 tablet by mouth daily.  30 tablet  3  . metoprolol tartrate (LOPRESSOR) 25 MG tablet Take 0.5 tablets (12.5 mg total) by mouth 2 (two) times daily.  90 tablet  3  . spironolactone (ALDACTONE) 25 MG tablet Take 1 tablet (25 mg total) by mouth 2 (two) times daily.  30 tablet  3  . acetaminophen (TYLENOL) 325 MG tablet Take 2 tablets (650 mg total) by mouth every 6 (six) hours.  30 tablet  1   No current facility-administered medications on file prior to visit.   Past Medical History  Diagnosis Date  . Hypertension   . Anemia   . Neck strain 02/06/2012  . Graves' disease   . Positive TB test   . H/O hiatal hernia   . Migraine headache     "couple times/month" (02/12/2013)  . Chronic lower back pain       Review of Systems  Respiratory: Negative.   Cardiovascular: Negative.   Gastrointestinal: Negative.   Musculoskeletal: Positive for arthralgias.       Left knee pain.  All other systems reviewed and are negative.   Filed Vitals:   08/06/13 1109 08/06/13 1132  BP: 165/98 152/79  Pulse: 64   Temp: 98.8 F (37.1 C)   TempSrc: Oral   Resp: 20   Weight: 179 lb (81.194 kg)   SpO2: 97%        Objective:   Physical Exam  Nursing note and vitals reviewed. Constitutional: She appears well-developed. No distress.  Cardiovascular: Normal rate, regular rhythm, normal heart sounds and intact distal pulses.   No murmur heard. Pulmonary/Chest: Effort normal and breath sounds normal. No respiratory distress. She has no wheezes. She exhibits no tenderness.  Abdominal: Soft. Bowel sounds are normal. She exhibits no distension and no mass. There is no tenderness.  Musculoskeletal:       Right knee: Normal.       Left knee: She exhibits decreased range of motion. She exhibits no effusion. Tenderness found.       Legs:      Assessment:     Knee pain: Left HTN:  Plan:     Check problem list.

## 2013-08-06 NOTE — Patient Instructions (Signed)
Knee Exercises EXERCISES RANGE OF MOTION(ROM) AND STRETCHING EXERCISES These exercises may help you when beginning to rehabilitate your injury. Your symptoms may resolve with or without further involvement from your physician, physical therapist or athletic trainer. While completing these exercises, remember:   Restoring tissue flexibility helps normal motion to return to the joints. This allows healthier, less painful movement and activity.  An effective stretch should be held for at least 30 seconds.  A stretch should never be painful. You should only feel a gentle lengthening or release in the stretched tissue. STRETCH - Knee Extension, Prone  Lie on your stomach on a firm surface, such as a bed or countertop. Place your right / left knee and leg just beyond the edge of the surface. You may wish to place a towel under the far end of your right / left thigh for comfort.  Relax your leg muscles and allow gravity to straighten your knee. Your clinician may advise you to add an ankle weight if more resistance is helpful for you.  You should feel a stretch in the back of your right / left knee. Hold this position for __________ seconds. Repeat __________ times. Complete this stretch __________ times per day. * Your physician, physical therapist or athletic trainer may ask you to add ankle weight to enhance your stretch.  RANGE OF MOTION - Knee Flexion, Active  Lie on your back with both knees straight. (If this causes back discomfort, bend your opposite knee, placing your foot flat on the floor.)  Slowly slide your heel back toward your buttocks until you feel a gentle stretch in the front of your knee or thigh.  Hold for __________ seconds. Slowly slide your heel back to the starting position. Repeat __________ times. Complete this exercise __________ times per day.  STRETCH - Quadriceps, Prone   Lie on your stomach on a firm surface, such as a bed or padded floor.  Bend your right /  left knee and grasp your ankle. If you are unable to reach, your ankle or pant leg, use a belt around your foot to lengthen your reach.  Gently pull your heel toward your buttocks. Your knee should not slide out to the side. You should feel a stretch in the front of your thigh and/or knee.  Hold this position for __________ seconds. Repeat __________ times. Complete this stretch __________ times per day.  STRETCH  Hamstrings, Supine   Lie on your back. Loop a belt or towel over the ball of your right / left foot.  Straighten your right / left knee and slowly pull on the belt to raise your leg. Do not allow the right / left knee to bend. Keep your opposite leg flat on the floor.  Raise the leg until you feel a gentle stretch behind your right / left knee or thigh. Hold this position for __________ seconds. Repeat __________ times. Complete this stretch __________ times per day.  STRENGTHENING EXERCISES These exercises may help you when beginning to rehabilitate your injury. They may resolve your symptoms with or without further involvement from your physician, physical therapist or athletic trainer. While completing these exercises, remember:   Muscles can gain both the endurance and the strength needed for everyday activities through controlled exercises.  Complete these exercises as instructed by your physician, physical therapist or athletic trainer. Progress the resistance and repetitions only as guided.  You may experience muscle soreness or fatigue, but the pain or discomfort you are trying to eliminate should   never worsen during these exercises. If this pain does worsen, stop and make certain you are following the directions exactly. If the pain is still present after adjustments, discontinue the exercise until you can discuss the trouble with your clinician. STRENGTH - Quadriceps, Isometrics  Lie on your back with your right / left leg extended and your opposite knee bent.  Gradually  tense the muscles in the front of your right / left thigh. You should see either your knee cap slide up toward your hip or increased dimpling just above the knee. This motion will push the back of the knee down toward the floor/mat/bed on which you are lying.  Hold the muscle as tight as you can without increasing your pain for __________ seconds.  Relax the muscles slowly and completely in between each repetition. Repeat __________ times. Complete this exercise __________ times per day.  STRENGTH - Quadriceps, Short Arcs   Lie on your back. Place a __________ inch towel roll under your knee so that the knee slightly bends.  Raise only your lower leg by tightening the muscles in the front of your thigh. Do not allow your thigh to rise.  Hold this position for __________ seconds. Repeat __________ times. Complete this exercise __________ times per day.  OPTIONAL ANKLE WEIGHTS: Begin with ____________________, but DO NOT exceed ____________________. Increase in 1 pound/0.5 kilogram increments.  STRENGTH - Quadriceps, Straight Leg Raises  Quality counts! Watch for signs that the quadriceps muscle is working to insure you are strengthening the correct muscles and not "cheating" by substituting with healthier muscles.  Lay on your back with your right / left leg extended and your opposite knee bent.  Tense the muscles in the front of your right / left thigh. You should see either your knee cap slide up or increased dimpling just above the knee. Your thigh may even quiver.  Tighten these muscles even more and raise your leg 4 to 6 inches off the floor. Hold for __________ seconds.  Keeping these muscles tense, lower your leg.  Relax the muscles slowly and completely in between each repetition. Repeat __________ times. Complete this exercise __________ times per day.  STRENGTH - Hamstring, Curls  Lay on your stomach with your legs extended. (If you lay on a bed, your feet may hang over the  edge.)  Tighten the muscles in the back of your thigh to bend your right / left knee up to 90 degrees. Keep your hips flat on the bed/floor.  Hold this position for __________ seconds.  Slowly lower your leg back to the starting position. Repeat __________ times. Complete this exercise __________ times per day.  OPTIONAL ANKLE WEIGHTS: Begin with ____________________, but DO NOT exceed ____________________. Increase in 1 pound/0.5 kilogram increments.  STRENGTH  Quadriceps, Squats  Stand in a door frame so that your feet and knees are in line with the frame.  Use your hands for balance, not support, on the frame.  Slowly lower your weight, bending at the hips and knees. Keep your lower legs upright so that they are parallel with the door frame. Squat only within the range that does not increase your knee pain. Never let your hips drop below your knees.  Slowly return upright, pushing with your legs, not pulling with your hands. Repeat __________ times. Complete this exercise __________ times per day.  STRENGTH - Quadriceps, Slotnick Slides  Follow guidelines for form closely. Increased knee pain often results from poorly placed feet or knees.  Lean against   a smooth [place] or door and walk your feet out 18-24 inches. Place your feet hip-width apart.  Slowly slide down the [place] or door until your knees bend __________ degrees.* Keep your knees over your heels, not your toes, and in line with your hips, not falling to either side.  Hold for __________ seconds. Stand up to rest for __________ seconds in between each repetition. Repeat __________ times. Complete this exercise __________ times per day. * Your physician, physical therapist or athletic trainer will alter this angle based on your symptoms and progress. Document Released: 04/27/2005 Document Revised: 09/05/2011 Document Reviewed: 09/25/2008 [employer] Patient Information 2014 [employer].  

## 2013-08-07 ENCOUNTER — Telehealth: Payer: Self-pay | Admitting: Family Medicine

## 2013-08-07 NOTE — Telephone Encounter (Signed)
Spoke with [facility] and PT doesn't accept the pink card which is what she has.  Just needed to know if you mentioned this patient when you spoke with her earlier. Jazmin Hartsell,CMA

## 2013-08-07 NOTE — Telephone Encounter (Signed)
Pt called and wanted the doctor to know that the cortisone shots helped some but she still hurts. SHe has until Friday to get a MRI done if the doctor wants her to. Please call and discuss her options. jw

## 2013-08-07 NOTE — Telephone Encounter (Signed)
I just sent [facility] blue team this message about 30 min ago,please review.  Hi Teresa [name] or Teresa [name],   Could you please inform patient PT would not take her insurance i.e pink card, if she continues to have pain to go to the ED.

## 2013-08-07 NOTE — Telephone Encounter (Signed)
Spoke with patient and she is aware of this.  She can not afford to go cash pay. Prithvi Kooi,CMA

## 2013-08-07 NOTE — Telephone Encounter (Signed)
I spoke with patient, she stated her knee pain is better but she mentioned to the person she spoke to earlier is foot pain. At this time I recommended PT, if no improvement to schedule follow up for reassessment.

## 2013-10-27 ENCOUNTER — Emergency Department (HOSPITAL_COMMUNITY)
Admission: EM | Admit: 2013-10-27 | Discharge: 2013-10-27 | Disposition: A | Discharge status: Home / Self Care | Payer: Self-pay | Attending: Emergency Medicine | Admitting: Emergency Medicine

## 2013-10-27 ENCOUNTER — Encounter (HOSPITAL_COMMUNITY): Payer: Self-pay | Admitting: Emergency Medicine

## 2013-10-27 ENCOUNTER — Emergency Department (HOSPITAL_COMMUNITY): Payer: Self-pay

## 2013-10-27 DIAGNOSIS — Z87828 Personal history of other (healed) physical injury and trauma: Secondary | ICD-10-CM | POA: Insufficient documentation

## 2013-10-27 DIAGNOSIS — Z862 Personal history of diseases of the blood and blood-forming organs and certain disorders involving the immune mechanism: Secondary | ICD-10-CM | POA: Insufficient documentation

## 2013-10-27 DIAGNOSIS — F17200 Nicotine dependence, unspecified, uncomplicated: Secondary | ICD-10-CM | POA: Insufficient documentation

## 2013-10-27 DIAGNOSIS — J309 Allergic rhinitis, unspecified: Secondary | ICD-10-CM | POA: Insufficient documentation

## 2013-10-27 DIAGNOSIS — J302 Other seasonal allergic rhinitis: Secondary | ICD-10-CM

## 2013-10-27 DIAGNOSIS — I10 Essential (primary) hypertension: Secondary | ICD-10-CM | POA: Insufficient documentation

## 2013-10-27 DIAGNOSIS — Z79899 Other long term (current) drug therapy: Secondary | ICD-10-CM | POA: Insufficient documentation

## 2013-10-27 DIAGNOSIS — G8929 Other chronic pain: Secondary | ICD-10-CM | POA: Insufficient documentation

## 2013-10-27 DIAGNOSIS — Z91040 Latex allergy status: Secondary | ICD-10-CM | POA: Insufficient documentation

## 2013-10-27 DIAGNOSIS — Z8611 Personal history of tuberculosis: Secondary | ICD-10-CM | POA: Insufficient documentation

## 2013-10-27 DIAGNOSIS — Z8639 Personal history of other endocrine, nutritional and metabolic disease: Secondary | ICD-10-CM | POA: Insufficient documentation

## 2013-10-27 DIAGNOSIS — M549 Dorsalgia, unspecified: Secondary | ICD-10-CM | POA: Insufficient documentation

## 2013-10-27 LAB — BASIC METABOLIC PANEL
BUN: 12 mg/dL (ref 6–23)
CO2: 24 mEq/L (ref 19–32)
Calcium: 9.4 mg/dL (ref 8.4–10.5)
Chloride: 102 mEq/L (ref 96–112)
Creatinine, Ser: 0.97 mg/dL (ref 0.50–1.10)
GFR calc Af Amer: 77 mL/min — ABNORMAL LOW (ref >90)
GFR calc non Af Amer: 66 mL/min — ABNORMAL LOW (ref >90)
Glucose, Bld: 82 mg/dL (ref 70–99)
Potassium: 4.1 mEq/L (ref 3.7–5.3)
Sodium: 139 mEq/L (ref 137–147)

## 2013-10-27 LAB — CBC
HCT: 41.4 % (ref 36.0–46.0)
Hemoglobin: 14.1 g/dL (ref 12.0–15.0)
MCH: 27.7 pg (ref 26.0–34.0)
MCHC: 34.1 g/dL (ref 30.0–36.0)
MCV: 81.3 fL (ref 78.0–100.0)
Platelets: 322 10*3/uL (ref 150–400)
RBC: 5.09 MIL/uL (ref 3.87–5.11)
RDW: 13.1 % (ref 11.5–15.5)
WBC: 7.1 10*3/uL (ref 4.0–10.5)

## 2013-10-27 LAB — PRO B NATRIURETIC PEPTIDE: Pro B Natriuretic peptide (BNP): 107.8 pg/mL (ref 0–125)

## 2013-10-27 LAB — I-STAT TROPONIN, ED: Troponin i, poc: 0 ng/mL (ref 0.00–0.08)

## 2013-10-27 MED ORDER — FEXOFENADINE HCL 60 MG PO TABS: 60.0000 mg | ORAL_TABLET | Freq: Every day | ORAL | Status: DC

## 2013-10-27 MED ORDER — ALBUTEROL SULFATE HFA 108 (90 BASE) MCG/ACT IN AERS: 1.0000 | INHALATION_SPRAY | Freq: Four times a day (QID) | RESPIRATORY_TRACT | Status: DC | PRN

## 2013-10-27 MED ORDER — IBUPROFEN 800 MG PO TABS
800.0000 mg | ORAL_TABLET | Freq: Once | ORAL | Status: AC
  Administered 2013-10-27: 800 mg via ORAL
  Filled 2013-10-27: qty 1

## 2013-10-27 MED ORDER — ALBUTEROL SULFATE (2.5 MG/3ML) 0.083% IN NEBU: 5.0000 mg | INHALATION_SOLUTION | Freq: Once | RESPIRATORY_TRACT | Status: DC

## 2013-10-27 MED ORDER — FLUTICASONE PROPIONATE 50 MCG/ACT NA SUSP: 2.0000 | Freq: Every day | NASAL | Status: DC

## 2013-10-27 NOTE — ED Notes (Signed)
Pt from home c/o of central chest pain radiating to back. She has had congestion and "allergies" x 2 weeks. She also c/o a headache.

## 2013-10-27 NOTE — ED Provider Notes (Signed)
CSN: [id number]     Arrival date & time 10/27/13  1415 History   First MD Initiated Contact with Patient 10/27/13 1537     Chief Complaint  Patient presents with  . Chest Pain  . Nasal Congestion  . Back Pain     (Consider location/radiation/quality/duration/timing/severity/associated sxs/prior Treatment) The history is provided by the patient.  Cortne Zywicki is a 53 y.o. female hx of HTN, anemia, chronic back pain, seasonal allergies here with sinus congestion, cough. Has been having sinus congestion about a month ago. She has tried over-the-counter Coricidin and improved her symptoms briefly. For the last two weeks, she had worse congestion and nonproductive cough. No fevers. Had worse cough yesterday and had trouble sleeping. When she coughs, she gets headaches and worsening of her chronic back pain. Had seasonal allergies last year, improved with flonase and allegra but didn't have insurance now so wasn't able to see her doctor.    Past Medical History  Diagnosis Date  . Hypertension   . Anemia   . Neck strain 02/06/2012  . Graves' disease   . Positive TB test   . H/O hiatal hernia   . Migraine headache     "couple times/month" (02/12/2013)  . Chronic lower back pain    Past Surgical History  Procedure Laterality Date  . Cesarean section  1979 1986 1990  . Appendectomy  2006  . Abdominal hysterectomy  2006   Family History  Problem Relation Age of Onset  . Heart disease Other   . Diabetes Other   . Heart disease Mother     CAB age 44  . Diabetes Mother   . Cancer Father 15    colon cancer  . Stroke Father   . Diabetes Sister   . Kidney disease Sister     renal failure  . Diabetes Brother   . Kidney disease Brother     renal failure   History  Substance Use Topics  . Smoking status: Current Every Day Smoker -- 0.25 packs/day for 30 years    Types: Cigarettes  . Smokeless tobacco: Never Used  . Alcohol Use: No   OB History   Grav Para Term Preterm Abortions  TAB SAB Ect Mult Living                 Review of Systems  Cardiovascular: Positive for chest pain.  Musculoskeletal: Positive for back pain.  All other systems reviewed and are negative.     Allergies  Clonidine derivatives; Aspirin; and Latex  Home Medications   Prior to Admission medications   Medication Sig Start Date End Date Taking? Authorizing Provider  amLODipine (NORVASC) 10 MG tablet Take 1 tablet (10 mg total) by mouth at bedtime. 05/30/13 05/30/14 Yes Phill Myron, MD  Chlorpheniramine-Acetaminophen (CORICIDIN HBP COLD/FLU PO) Take 1 tablet by mouth 2 (two) times daily.   Yes Historical Provider, MD  levothyroxine (SYNTHROID, LEVOTHROID) 100 MCG tablet Take 1 tablet (100 mcg total) by mouth every morning. 10/23/12  Yes Coral Spikes, DO  lisinopril-hydrochlorothiazide (PRINZIDE,ZESTORETIC) 20-25 MG per tablet Take 1 tablet by mouth daily. 05/30/13  Yes Phill Myron, MD  metoprolol tartrate (LOPRESSOR) 25 MG tablet Take 0.5 tablets (12.5 mg total) by mouth 2 (two) times daily. 05/30/13 05/30/14 Yes Phill Myron, MD  Polyvinyl Alcohol-Povidone (REFRESH OP) Apply 2 drops to eye 3 (three) times daily.   Yes Historical Provider, MD  potassium chloride SA (K-DUR,KLOR-CON) 20 MEQ tablet Take 20 mEq by mouth daily.  Yes Historical Provider, MD  spironolactone (ALDACTONE) 25 MG tablet Take 1 tablet (25 mg total) by mouth 2 (two) times daily. 05/30/13  Yes Phill Myron, MD   BP 152/90  Pulse 71  Temp(Src) 97.9 F (36.6 C) (Oral)  Resp 18  Ht 5\' 5"  (1.651 m)  Wt 180 lb (81.647 kg)  BMI 29.95 kg/m2  SpO2 98% Physical Exam  Nursing note and vitals reviewed. Constitutional: She is oriented to person, place, and time. She appears well-developed and well-nourished.  HENT:  Head: Normocephalic.  Mouth/Throat: Oropharynx is clear and moist.  No sinus tenderness   Eyes: Conjunctivae and EOM are normal. Pupils are equal, round, and reactive to light.  Neck: Normal range of motion. Neck  supple.  Cardiovascular: Normal rate, regular rhythm and normal heart sounds.   Pulmonary/Chest: Effort normal and breath sounds normal. No respiratory distress. She has no wheezes. She has no rales.  Abdominal: Soft. Bowel sounds are normal. She exhibits no distension. There is no tenderness. There is no rebound and no guarding.  Musculoskeletal: Normal range of motion. She exhibits no edema and no tenderness.  Neurological: She is alert and oriented to person, place, and time. No cranial nerve deficit. Coordination normal.  Skin: Skin is warm and dry.  Psychiatric: She has a normal mood and affect. Her behavior is normal. Judgment and thought content normal.    ED Course  Procedures (including critical care time) Labs Review Labs Reviewed  BASIC METABOLIC PANEL - Abnormal; Notable for the following:    GFR calc non Af Amer 66 (*)    GFR calc Af Amer 77 (*)    All other components within normal limits  CBC  PRO B NATRIURETIC PEPTIDE  I-STAT TROPOININ, ED    Imaging Review Dg Chest 2 View  10/27/2013   CLINICAL DATA:  Chest pain  EXAM: CHEST  2 VIEW  COMPARISON:  05/04/2011  FINDINGS: Cardiac silhouette is normal in size. Aorta is mildly uncoiled. No mediastinal or hilar masses. Clear lungs. No pleural effusion. No pneumothorax.  Bony thorax is intact.  IMPRESSION: No active cardiopulmonary disease.   Electronically Signed   By: Lajean Manes M.D.   On: 10/27/2013 14:56     EKG Interpretation   Date/Time:  Sunday Oct 27 2013 14:31:03 EDT Ventricular Rate:  65 PR Interval:  146 QRS Duration: 77 QT Interval:  423 QTC Calculation: 440 R Axis:   20 Text Interpretation:  Sinus rhythm Probable left atrial enlargement Left  ventricular hypertrophy Abnormal T, consider ischemia, diffuse leads  Baseline wander in lead(s) II aVF No significant change since last tracing  Confirmed by POLLINA  MD, CHRISTOPHER [phone]) on 10/27/2013 2:45:08 PM      MDM   Final diagnoses:  None    Keni Grippi is a 53 y.o. female here with cough, congestion. CXR nl, labs sent in triage and is nl. EKG unchanged. I doubt PE or ACS or pneumonia. Likely seasonal allergies. Will give flonase, allegra, prn albuterol for cough. Recommend outpatient f/u.    Wandra Arthurs, MD 10/27/13 [phone]

## 2013-10-27 NOTE — ED Notes (Signed)
MD at bedside. 

## 2013-10-27 NOTE — Discharge Instructions (Signed)
Take allegra daily.   Use flonase as prescribed.   Use albuterol as needed for trouble breathing.   Follow up with your doctor.   Return to ER if you have worse congestion, trouble breathing.

## 2013-12-01 ENCOUNTER — Emergency Department (HOSPITAL_COMMUNITY)
Admission: EM | Admit: 2013-12-01 | Discharge: 2013-12-01 | Disposition: A | Discharge status: Home / Self Care | Payer: Self-pay | Attending: Emergency Medicine | Admitting: Emergency Medicine

## 2013-12-01 ENCOUNTER — Encounter (HOSPITAL_COMMUNITY): Payer: Self-pay | Admitting: Emergency Medicine

## 2013-12-01 DIAGNOSIS — W57XXXA Bitten or stung by nonvenomous insect and other nonvenomous arthropods, initial encounter: Secondary | ICD-10-CM

## 2013-12-01 DIAGNOSIS — Z8639 Personal history of other endocrine, nutritional and metabolic disease: Secondary | ICD-10-CM | POA: Insufficient documentation

## 2013-12-01 DIAGNOSIS — Z8719 Personal history of other diseases of the digestive system: Secondary | ICD-10-CM | POA: Insufficient documentation

## 2013-12-01 DIAGNOSIS — G43909 Migraine, unspecified, not intractable, without status migrainosus: Secondary | ICD-10-CM | POA: Insufficient documentation

## 2013-12-01 DIAGNOSIS — Z79899 Other long term (current) drug therapy: Secondary | ICD-10-CM | POA: Insufficient documentation

## 2013-12-01 DIAGNOSIS — L089 Local infection of the skin and subcutaneous tissue, unspecified: Secondary | ICD-10-CM | POA: Insufficient documentation

## 2013-12-01 DIAGNOSIS — I10 Essential (primary) hypertension: Secondary | ICD-10-CM | POA: Insufficient documentation

## 2013-12-01 DIAGNOSIS — Z91040 Latex allergy status: Secondary | ICD-10-CM | POA: Insufficient documentation

## 2013-12-01 DIAGNOSIS — Y929 Unspecified place or not applicable: Secondary | ICD-10-CM | POA: Insufficient documentation

## 2013-12-01 DIAGNOSIS — Z87828 Personal history of other (healed) physical injury and trauma: Secondary | ICD-10-CM | POA: Insufficient documentation

## 2013-12-01 DIAGNOSIS — G8929 Other chronic pain: Secondary | ICD-10-CM | POA: Insufficient documentation

## 2013-12-01 DIAGNOSIS — IMO0002 Reserved for concepts with insufficient information to code with codable children: Secondary | ICD-10-CM | POA: Insufficient documentation

## 2013-12-01 DIAGNOSIS — F17200 Nicotine dependence, unspecified, uncomplicated: Secondary | ICD-10-CM | POA: Insufficient documentation

## 2013-12-01 DIAGNOSIS — S20169A Insect bite (nonvenomous) of breast, unspecified breast, initial encounter: Principal | ICD-10-CM

## 2013-12-01 DIAGNOSIS — Z862 Personal history of diseases of the blood and blood-forming organs and certain disorders involving the immune mechanism: Secondary | ICD-10-CM | POA: Insufficient documentation

## 2013-12-01 DIAGNOSIS — Y9389 Activity, other specified: Secondary | ICD-10-CM | POA: Insufficient documentation

## 2013-12-01 MED ORDER — DOXYCYCLINE HYCLATE 100 MG PO CAPS: 100.0000 mg | ORAL_CAPSULE | Freq: Two times a day (BID) | ORAL | Status: DC

## 2013-12-01 NOTE — ED Notes (Signed)
Declined W/C at D/C and was escorted to lobby by RN. 

## 2013-12-01 NOTE — ED Notes (Signed)
She found a tick on her back Thursday. She thought she got the tick off but today she woke with redness and tenderness at the site. She is concerned that some of the tick may remain

## 2013-12-01 NOTE — Discharge Instructions (Signed)
Read the information below.  Use the prescribed medication as directed.  Please discuss all new medications with your pharmacist.  You may return to the Emergency Department at any time for worsening condition or any new symptoms that concern you.    If you develop fever, chills, body aches, diffuse rash, return to the ER immediately for a recheck.    Tick Bite Information Ticks are insects that attach themselves to the skin and draw blood for food. There are various types of ticks. Common types include wood ticks and deer ticks. Most ticks live in shrubs and grassy areas. Ticks can climb onto your body when you make contact with leaves or grass where the tick is waiting. The most common places on the body for ticks to attach themselves are the scalp, neck, armpits, waist, and groin. Most tick bites are harmless, but sometimes ticks carry germs that cause diseases. These germs can be spread to a person during the tick's feeding process. The chance of a disease spreading through a tick bite depends on:   The type of tick.  Time of year.   How long the tick is attached.   Geographic location.  HOW CAN YOU PREVENT TICK BITES? Take these steps to help prevent tick bites when you are outdoors:  Wear protective clothing. Long sleeves and long pants are best.   Wear white clothes so you can see ticks more easily.  Tuck your pant legs into your socks.   If walking on a trail, stay in the middle of the trail to avoid brushing against bushes.  Avoid walking through areas with long grass.  Put insect repellent on all exposed skin and along boot tops, pant legs, and sleeve cuffs.   Check clothing, hair, and skin repeatedly and before going inside.   Brush off any ticks that are not attached.  Take a shower or bath as soon as possible after being outdoors.  WHAT IS THE PROPER WAY TO REMOVE A TICK? Ticks should be removed as soon as possible to help prevent diseases caused by tick  bites. 1. If latex gloves are available, put them on before trying to remove a tick.  2. Using fine-point tweezers, grasp the tick as close to the skin as possible. You may also use curved forceps or a tick removal tool. Grasp the tick as close to its head as possible. Avoid grasping the tick on its body. 3. Pull gently with steady upward pressure until the tick lets go. Do not twist the tick or jerk it suddenly. This may break off the tick's head or mouth parts. 4. Do not squeeze or crush the tick's body. This could force disease-carrying fluids from the tick into your body.  5. After the tick is removed, wash the bite area and your hands with soap and water or other disinfectant such as alcohol. 6. Apply a small amount of antiseptic cream or ointment to the bite site.  7. Wash and disinfect any instruments that were used.  Do not try to remove a tick by applying a hot match, petroleum jelly, or fingernail polish to the tick. These methods do not work and may increase the chances of disease being spread from the tick bite.  WHEN SHOULD YOU SEEK MEDICAL CARE? Contact your health care provider if you are unable to remove a tick from your skin or if a part of the tick breaks off and is stuck in the skin.  After a tick bite, you need to  be aware of signs and symptoms that could be related to diseases spread by ticks. Contact your health care provider if you develop any of the following in the days or weeks after the tick bite:  Unexplained fever.  Rash. A circular rash that appears days or weeks after the tick bite may indicate the possibility of Lyme disease. The rash may resemble a target with a bull's-eye and may occur at a different part of your body than the tick bite.  Redness and swelling in the area of the tick bite.   Tender, swollen lymph glands.   Diarrhea.   Weight loss.   Cough.   Fatigue.   Muscle, joint, or bone pain.   Abdominal pain.   Headache.    Lethargy or a change in your level of consciousness.  Difficulty walking or moving your legs.   Numbness in the legs.   Paralysis.  Shortness of breath.   Confusion.   Repeated vomiting.  Document Released: 06/10/2000 Document Revised: 04/03/2013 Document Reviewed: 11/21/2012 [facility] Patient Information 2014 [place].

## 2013-12-01 NOTE — ED Provider Notes (Signed)
CSN: [id number]     Arrival date & time 12/01/13  1016 History  This chart was scribed for non-physician practitioner, Clayton Bibles, PA-C working with Neta Ehlers, MD, by Erling Conte, ED Scribe. This patient was seen in room TR06C/TR06C and the patient's care was started at 10:30 AM.    Chief Complaint  Patient presents with  . Tick Removal      The history is provided by the patient. No language interpreter was used.   HPI Comments: Teresa [name] is a 53 y.o. female who presents to the Emergency Department due a tick she found on her back two days ago. States it was likely attached two days as she went fishing two days prior to finding the tick. Patient states she kept having this sharp pain in her right armpit and she taking her clothes off and found the tick. Removed the tick with tweezers. She states that she has some swelling, itchiness, redness and tenderness in the site where she removed the tick. She states that a bump formed in the site and that her sister popped the bumped and their was some discharge from it. Patient is unsure of what the discharge consisted of. She is concerned some of the tick could be stuck in her body. Patient denies any fever, chills, myalgias, diffuse rash.  She generally feels well, has no other complaints.         Past Medical History  Diagnosis Date  . Hypertension   . Anemia   . Neck strain 02/06/2012  . Graves' disease   . Positive TB test   . H/O hiatal hernia   . Migraine headache     "couple times/month" (02/12/2013)  . Chronic lower back pain    Past Surgical History  Procedure Laterality Date  . Cesarean section  1979 1986 1990  . Appendectomy  2006  . Abdominal hysterectomy  2006   Family History  Problem Relation Age of Onset  . Heart disease Other   . Diabetes Other   . Heart disease Mother     CAB age 69  . Diabetes Mother   . Cancer Father 40    colon cancer  . Stroke Father   . Diabetes Sister   . Kidney disease  Sister     renal failure  . Diabetes Brother   . Kidney disease Brother     renal failure   History  Substance Use Topics  . Smoking status: Current Every Day Smoker -- 0.25 packs/day for 30 years    Types: Cigarettes  . Smokeless tobacco: Never Used  . Alcohol Use: No   OB History   Grav Para Term Preterm Abortions TAB SAB Ect Mult Living                 Review of Systems  Constitutional: Negative for fever.  Skin: Positive for color change (redness of tick bite site).       Tick bite to her back, tenderness, itching, and swelling present  All other systems reviewed and are negative.     Allergies  Clonidine derivatives; Aspirin; and Latex  Home Medications   Prior to Admission medications   Medication Sig Start Date End Date Taking? Authorizing Provider  albuterol (PROVENTIL HFA;VENTOLIN HFA) 108 (90 BASE) MCG/ACT inhaler Inhale 1-2 puffs into the lungs every 6 (six) hours as needed for wheezing or shortness of breath. 10/27/13   Wandra Arthurs, MD  amLODipine (NORVASC) 10 MG tablet Take 1 tablet (10  mg total) by mouth at bedtime. 05/30/13 05/30/14  Phill Myron, MD  Chlorpheniramine-Acetaminophen (CORICIDIN HBP COLD/FLU PO) Take 1 tablet by mouth 2 (two) times daily.    Historical Provider, MD  fexofenadine (ALLEGRA) 60 MG tablet Take 1 tablet (60 mg total) by mouth daily. 10/27/13   Wandra Arthurs, MD  fluticasone (FLONASE) 50 MCG/ACT nasal spray Place 2 sprays into both nostrils daily. 10/27/13   Wandra Arthurs, MD  levothyroxine (SYNTHROID, LEVOTHROID) 100 MCG tablet Take 1 tablet (100 mcg total) by mouth every morning. 10/23/12   Coral Spikes, DO  lisinopril-hydrochlorothiazide (PRINZIDE,ZESTORETIC) 20-25 MG per tablet Take 1 tablet by mouth daily. 05/30/13   Phill Myron, MD  metoprolol tartrate (LOPRESSOR) 25 MG tablet Take 0.5 tablets (12.5 mg total) by mouth 2 (two) times daily. 05/30/13 05/30/14  Phill Myron, MD  Polyvinyl Alcohol-Povidone (REFRESH OP) Apply 2 drops to eye 3 (three)  times daily.    Historical Provider, MD  potassium chloride SA (K-DUR,KLOR-CON) 20 MEQ tablet Take 20 mEq by mouth daily.    Historical Provider, MD  spironolactone (ALDACTONE) 25 MG tablet Take 1 tablet (25 mg total) by mouth 2 (two) times daily. 05/30/13   Phill Myron, MD   Triage Vitals: BP 192/89  Pulse 58  Temp(Src) 98.2 F (36.8 C) (Oral)  SpO2 100%  Physical Exam  Nursing note and vitals reviewed. Constitutional: She appears well-developed and well-nourished. No distress.  HENT:  Head: Normocephalic and atraumatic.  Neck: Neck supple.  Pulmonary/Chest: Effort normal.  Lymphadenopathy:    She has no axillary adenopathy.       Right: No supraclavicular and no epitrochlear adenopathy present.  Neurological: She is alert.  Skin: She is not diaphoretic.  Right mid back with small area of induration, approx 1 by 3cm, with overlying erythema, and central scabbing,  no fluctuance, no discharge. No central clearing. Tender to palpation.     ED Course  Procedures (including critical care time)  DIAGNOSTIC STUDIES: Oxygen Saturation is 100% on RA, normal by my interpretation.    COORDINATION OF CARE: 11:39 AM Will discharge and order doxycycline.  Pt advised of plan for treatment and pt agrees.   Labs Review Labs Reviewed - No data to display  Imaging Review No results found.   EKG Interpretation None      MDM   Final diagnoses:  Infected tick bite    Afebrile nontoxic patient with tick attached approximately 48 hours, now two days later with localized erythema and induration.  No active discharge.  Suspect local infection.  Will start doxycycline.  Have advised pt to follow up in 2-3 days for recheck.  Doubt disseminated infection such as Lyme or RSSS but doxycycline started for infection and pt to have close follow up.  Discussed findings, treatment, and follow up  with patient.  Pt given return precautions.  Pt verbalizes understanding and agrees with plan.      I  personally performed the services described in this documentation, which was scribed in my presence. The recorded information has been reviewed and is accurate.     Clayton Bibles, PA-C 12/01/13 1212

## 2013-12-02 NOTE — ED Provider Notes (Signed)
Medical screening examination/treatment/procedure(s) were performed by non-physician practitioner and as supervising physician I was immediately available for consultation/collaboration.  Neta Ehlers, MD 12/02/13 [phone]

## 2013-12-06 ENCOUNTER — Encounter (HOSPITAL_COMMUNITY): Payer: Self-pay | Admitting: Emergency Medicine

## 2013-12-06 ENCOUNTER — Emergency Department (HOSPITAL_COMMUNITY)
Admission: EM | Admit: 2013-12-06 | Discharge: 2013-12-06 | Disposition: A | Discharge status: Home / Self Care | Payer: Self-pay | Attending: Emergency Medicine | Admitting: Emergency Medicine

## 2013-12-06 DIAGNOSIS — L02219 Cutaneous abscess of trunk, unspecified: Secondary | ICD-10-CM | POA: Insufficient documentation

## 2013-12-06 DIAGNOSIS — Z8611 Personal history of tuberculosis: Secondary | ICD-10-CM | POA: Insufficient documentation

## 2013-12-06 DIAGNOSIS — I10 Essential (primary) hypertension: Secondary | ICD-10-CM | POA: Insufficient documentation

## 2013-12-06 DIAGNOSIS — Z792 Long term (current) use of antibiotics: Secondary | ICD-10-CM | POA: Insufficient documentation

## 2013-12-06 DIAGNOSIS — Z91040 Latex allergy status: Secondary | ICD-10-CM | POA: Insufficient documentation

## 2013-12-06 DIAGNOSIS — L03319 Cellulitis of trunk, unspecified: Principal | ICD-10-CM

## 2013-12-06 DIAGNOSIS — M545 Low back pain, unspecified: Secondary | ICD-10-CM | POA: Insufficient documentation

## 2013-12-06 DIAGNOSIS — G8929 Other chronic pain: Secondary | ICD-10-CM | POA: Insufficient documentation

## 2013-12-06 DIAGNOSIS — IMO0002 Reserved for concepts with insufficient information to code with codable children: Secondary | ICD-10-CM | POA: Insufficient documentation

## 2013-12-06 DIAGNOSIS — Z79899 Other long term (current) drug therapy: Secondary | ICD-10-CM | POA: Insufficient documentation

## 2013-12-06 DIAGNOSIS — Z862 Personal history of diseases of the blood and blood-forming organs and certain disorders involving the immune mechanism: Secondary | ICD-10-CM | POA: Insufficient documentation

## 2013-12-06 DIAGNOSIS — Z8639 Personal history of other endocrine, nutritional and metabolic disease: Secondary | ICD-10-CM | POA: Insufficient documentation

## 2013-12-06 DIAGNOSIS — Z8719 Personal history of other diseases of the digestive system: Secondary | ICD-10-CM | POA: Insufficient documentation

## 2013-12-06 DIAGNOSIS — L0390 Cellulitis, unspecified: Secondary | ICD-10-CM

## 2013-12-06 DIAGNOSIS — F17200 Nicotine dependence, unspecified, uncomplicated: Secondary | ICD-10-CM | POA: Insufficient documentation

## 2013-12-06 MED ORDER — CEPHALEXIN 500 MG PO CAPS: 500.0000 mg | ORAL_CAPSULE | Freq: Four times a day (QID) | ORAL | Status: DC

## 2013-12-06 NOTE — ED Notes (Signed)
Pt reports she was bit by a tic last Wednesday and came to ER on Sunday and placed on antibiotic which she is still taking. Pt reports that area is getting bigger. Area is indurated and red. Pt sts it itches and only painful when its touched.

## 2013-12-06 NOTE — ED Provider Notes (Signed)
CSN: [id number]     Arrival date & time 12/06/13  1953 History   First MD Initiated Contact with Patient 12/06/13 2028    This chart was scribed for non-physician practitioner, Hyman Bible, PA, working with Osvaldo Shipper, MD by Terressa Koyanagi, ED Scribe. This patient was seen in room TR08C/TR08C and the patient's care was started at 9:45 PM.  PCP: [name], MD  Chief Complaint  Patient presents with  . Wound Check   HPI HPI Comments: Teresa [name] is a 53 y.o. female, with an extensive medical Hx including HTN and positive TB test, who presents to the Emergency Department complaining of wound check. Pt reports that she presented to, and was treated at, the ED 5 days ago for a tick bite to the right side of her back; at that time pt was started on doxycycline and advised to f/u in 2-3 days for recheck. Pt presently reports that the wound area is getting larger, is mildly indurated and red. Pt also states that it itches and is painful to touch.  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 Pt reports that she has been compliant with the doxycycline. Pt denies fever, chills, nausea, vomiting.  Patient denies history of DM.  Pt is a current everyday smoker who smokes .25 ppd for the past 30 years.   Past Medical History  Diagnosis Date  . Hypertension   . Anemia   . Neck strain 02/06/2012  . Graves' disease   . Positive TB test   . H/O hiatal hernia   . Migraine headache     "couple times/month" (02/12/2013)  . Chronic lower back pain    Past Surgical History  Procedure Laterality Date  . Cesarean section  1979 1986 1990  . Appendectomy  2006  . Abdominal hysterectomy  2006   Family History  Problem Relation Age of Onset  . Heart disease Other   . Diabetes Other   . Heart disease Mother     CAB age 7  . Diabetes Mother   . Cancer Father 65    colon cancer  . Stroke Father    . Diabetes Sister   . Kidney disease Sister     renal failure  . Diabetes Brother   . Kidney disease Brother     renal failure   History  Substance Use Topics  . Smoking status: Current Every Day Smoker -- 0.25 packs/day for 30 years    Types: Cigarettes  . Smokeless tobacco: Never Used  . Alcohol Use: No   OB History   Grav Para Term Preterm Abortions TAB SAB Ect Mult Living                 Review of Systems  Constitutional: Negative for fever and chills.  Gastrointestinal: Negative for nausea and vomiting.  Musculoskeletal:       Body aches (at baseline)  Skin:       Tick bite to back that is mildly indurated, red, itchy and painful when touched       Allergies  Clonidine derivatives; Aspirin; and Latex  Home Medications   Prior to Admission medications   Medication Sig Start Date End Date Taking? Authorizing Provider  albuterol (PROVENTIL HFA;VENTOLIN HFA) 108 (90 BASE) MCG/ACT inhaler Inhale 1-2 puffs into the lungs every 6 (six) hours as needed for wheezing or shortness of  breath. 10/27/13   Wandra Arthurs, MD  amLODipine (NORVASC) 10 MG tablet Take 1 tablet (10 mg total) by mouth at bedtime. 05/30/13 05/30/14  Phill Myron, MD  Chlorpheniramine-Acetaminophen (CORICIDIN HBP COLD/FLU PO) Take 1 tablet by mouth 2 (two) times daily.    Historical Provider, MD  doxycycline (VIBRAMYCIN) 100 MG capsule Take 1 capsule (100 mg total) by mouth 2 (two) times daily. One po bid x 7 days 12/01/13   Clayton Bibles, PA-C  fexofenadine (ALLEGRA) 60 MG tablet Take 1 tablet (60 mg total) by mouth daily. 10/27/13   Wandra Arthurs, MD  fluticasone (FLONASE) 50 MCG/ACT nasal spray Place 2 sprays into both nostrils daily. 10/27/13   Wandra Arthurs, MD  levothyroxine (SYNTHROID, LEVOTHROID) 100 MCG tablet Take 1 tablet (100 mcg total) by mouth every morning. 10/23/12   Coral Spikes, DO  lisinopril-hydrochlorothiazide (PRINZIDE,ZESTORETIC) 20-25 MG per tablet Take 1 tablet by mouth daily. 05/30/13   Phill Myron,  MD  metoprolol tartrate (LOPRESSOR) 25 MG tablet Take 0.5 tablets (12.5 mg total) by mouth 2 (two) times daily. 05/30/13 05/30/14  Phill Myron, MD  Polyvinyl Alcohol-Povidone (REFRESH OP) Apply 2 drops to eye 3 (three) times daily.    Historical Provider, MD  potassium chloride SA (K-DUR,KLOR-CON) 20 MEQ tablet Take 20 mEq by mouth daily.    Historical Provider, MD  spironolactone (ALDACTONE) 25 MG tablet Take 1 tablet (25 mg total) by mouth 2 (two) times daily. 05/30/13   Phill Myron, MD   Triage Vitals: BP 180/90  Pulse 72  Temp(Src) 98.6 F (37 C) (Oral)  Resp 20  Ht 5\' 5"  (1.651 m)  Wt 186 lb 11.2 oz (84.687 kg)  BMI 31.07 kg/m2  SpO2 95% Physical Exam  Nursing note and vitals reviewed. Constitutional: She is oriented to person, place, and time. She appears well-developed and well-nourished. No distress.  HENT:  Head: Normocephalic and atraumatic.  Eyes: Conjunctivae and EOM are normal.  Neck: Normal range of motion. No tracheal deviation present.  Cardiovascular: Normal rate.   Pulmonary/Chest: Effort normal. No respiratory distress.  Musculoskeletal: Normal range of motion.  Neurological: She is alert and oriented to person, place, and time.  Skin: Skin is warm and dry.  Right mid back with small area of induration, approx 2.5 by 1cm, with overlying erythema, and central scabbing,  no fluctuance, no discharge. No central clearing. Tender to palpation.    Psychiatric: She has a normal mood and affect. Her behavior is normal.    ED Course  Procedures (including critical care time) DIAGNOSTIC STUDIES: Oxygen Saturation is 95% on RA, adequate by my interpretation.    COORDINATION OF CARE: 9:53 PM-Discussed treatment plan which includes icing the area (20 min off/ 20 min on), adding another antibiotic (Kelfex), complete doxycycline, with pt at bedside. Patient verbalizes understanding and agrees with treatment plan. Pt advised to f/u at ED, PCP or Urgent Care if Sx not improving  in 2-3 days.   Labs Review Labs Reviewed - No data to display  Imaging Review No results found.   EKG Interpretation None      MDM   Final diagnoses:  None   Patient presenting with a tic bite to her back.  Area appears to be indurated, but no fluctuance.  Appearance not consistent with Lyme Disease.  Patient is afebrile and nontoxic appearing.  Suspect mild Cellulitis.  Patient instructed to continue Doxycycline and also added Keflex.  Feel that the patient is stable for discharge.  Return  precautions given.   Hyman Bible, PA-C 12/07/13 0139

## 2013-12-09 NOTE — ED Provider Notes (Signed)
Medical screening examination/treatment/procedure(s) were performed by non-physician practitioner and as supervising physician I was immediately available for consultation/collaboration.   EKG Interpretation None        Osvaldo Shipper, MD 12/09/13 [phone]

## 2013-12-19 ENCOUNTER — Emergency Department (HOSPITAL_COMMUNITY)
Admission: EM | Admit: 2013-12-19 | Discharge: 2013-12-19 | Disposition: A | Discharge status: Home / Self Care | Payer: Self-pay | Attending: Emergency Medicine | Admitting: Emergency Medicine

## 2013-12-19 ENCOUNTER — Encounter (HOSPITAL_COMMUNITY): Payer: Self-pay | Admitting: Emergency Medicine

## 2013-12-19 ENCOUNTER — Emergency Department (HOSPITAL_COMMUNITY): Payer: Self-pay

## 2013-12-19 DIAGNOSIS — M79605 Pain in left leg: Secondary | ICD-10-CM

## 2013-12-19 DIAGNOSIS — Z79899 Other long term (current) drug therapy: Secondary | ICD-10-CM | POA: Insufficient documentation

## 2013-12-19 DIAGNOSIS — Z87828 Personal history of other (healed) physical injury and trauma: Secondary | ICD-10-CM | POA: Insufficient documentation

## 2013-12-19 DIAGNOSIS — Z862 Personal history of diseases of the blood and blood-forming organs and certain disorders involving the immune mechanism: Secondary | ICD-10-CM | POA: Insufficient documentation

## 2013-12-19 DIAGNOSIS — M79609 Pain in unspecified limb: Secondary | ICD-10-CM | POA: Insufficient documentation

## 2013-12-19 DIAGNOSIS — M10062 Idiopathic gout, left knee: Secondary | ICD-10-CM

## 2013-12-19 DIAGNOSIS — G8929 Other chronic pain: Secondary | ICD-10-CM | POA: Insufficient documentation

## 2013-12-19 DIAGNOSIS — Z792 Long term (current) use of antibiotics: Secondary | ICD-10-CM | POA: Insufficient documentation

## 2013-12-19 DIAGNOSIS — I10 Essential (primary) hypertension: Secondary | ICD-10-CM | POA: Insufficient documentation

## 2013-12-19 DIAGNOSIS — F17200 Nicotine dependence, unspecified, uncomplicated: Secondary | ICD-10-CM | POA: Insufficient documentation

## 2013-12-19 DIAGNOSIS — Z91040 Latex allergy status: Secondary | ICD-10-CM | POA: Insufficient documentation

## 2013-12-19 DIAGNOSIS — G43909 Migraine, unspecified, not intractable, without status migrainosus: Secondary | ICD-10-CM | POA: Insufficient documentation

## 2013-12-19 DIAGNOSIS — M109 Gout, unspecified: Secondary | ICD-10-CM | POA: Insufficient documentation

## 2013-12-19 DIAGNOSIS — S8290XD Unspecified fracture of unspecified lower leg, subsequent encounter for closed fracture with routine healing: Secondary | ICD-10-CM | POA: Insufficient documentation

## 2013-12-19 DIAGNOSIS — Z8719 Personal history of other diseases of the digestive system: Secondary | ICD-10-CM | POA: Insufficient documentation

## 2013-12-19 DIAGNOSIS — S82142S Displaced bicondylar fracture of left tibia, sequela: Secondary | ICD-10-CM

## 2013-12-19 LAB — CBC WITH DIFFERENTIAL/PLATELET
Basophils Absolute: 0.1 10*3/uL (ref 0.0–0.1)
Basophils Relative: 1 % (ref 0–1)
Eosinophils Absolute: 0.3 10*3/uL (ref 0.0–0.7)
Eosinophils Relative: 4 % (ref 0–5)
HCT: 40.8 % (ref 36.0–46.0)
Hemoglobin: 13.5 g/dL (ref 12.0–15.0)
Lymphocytes Relative: 38 % (ref 12–46)
Lymphs Abs: 2.4 10*3/uL (ref 0.7–4.0)
MCH: 27 pg (ref 26.0–34.0)
MCHC: 33.1 g/dL (ref 30.0–36.0)
MCV: 81.6 fL (ref 78.0–100.0)
Monocytes Absolute: 0.4 10*3/uL (ref 0.1–1.0)
Monocytes Relative: 7 % (ref 3–12)
Neutro Abs: 3.3 10*3/uL (ref 1.7–7.7)
Neutrophils Relative %: 50 % (ref 43–77)
Platelets: 239 10*3/uL (ref 150–400)
RBC: 5 MIL/uL (ref 3.87–5.11)
RDW: 13.5 % (ref 11.5–15.5)
WBC: 6.5 10*3/uL (ref 4.0–10.5)

## 2013-12-19 LAB — BASIC METABOLIC PANEL
BUN: 12 mg/dL (ref 6–23)
CO2: 26 mEq/L (ref 19–32)
Calcium: 9.2 mg/dL (ref 8.4–10.5)
Chloride: 103 mEq/L (ref 96–112)
Creatinine, Ser: 0.93 mg/dL (ref 0.50–1.10)
GFR calc Af Amer: 81 mL/min — ABNORMAL LOW (ref >90)
GFR calc non Af Amer: 70 mL/min — ABNORMAL LOW (ref >90)
Glucose, Bld: 97 mg/dL (ref 70–99)
Potassium: 3.4 mEq/L — ABNORMAL LOW (ref 3.7–5.3)
Sodium: 144 mEq/L (ref 137–147)

## 2013-12-19 LAB — SEDIMENTATION RATE: Sed Rate: 2 mm/hr (ref 0–22)

## 2013-12-19 MED ORDER — MORPHINE SULFATE 4 MG/ML IJ SOLN
4.0000 mg | INTRAMUSCULAR | Status: DC | PRN
  Administered 2013-12-19 (×2): 4 mg via INTRAVENOUS
  Filled 2013-12-19 (×2): qty 1

## 2013-12-19 MED ORDER — KETOROLAC TROMETHAMINE 30 MG/ML IJ SOLN
30.0000 mg | Freq: Once | INTRAMUSCULAR | Status: AC
  Administered 2013-12-19: 30 mg via INTRAVENOUS
  Filled 2013-12-19: qty 1

## 2013-12-19 MED ORDER — ONDANSETRON HCL 4 MG/2ML IJ SOLN
4.0000 mg | Freq: Once | INTRAMUSCULAR | Status: AC
  Administered 2013-12-19: 4 mg via INTRAVENOUS
  Filled 2013-12-19: qty 2

## 2013-12-19 NOTE — ED Notes (Signed)
Pt sts left leg swelling x a few weeks. sts swelling from ankle to knee. sts that she doesn't know if it is from a tic bite or twisted ankle.

## 2013-12-19 NOTE — ED Provider Notes (Signed)
CSN: [id number]     Arrival date & time 12/19/13  0759 History   First MD Initiated Contact with Patient 12/19/13 0802     Chief Complaint  Patient presents with  . Leg Swelling      HPI  She presents with knee, and ankle pain. She twisted her knee about a month ago in her knee and ankle been sore since that time. Also was seen for a tick bite or some doxycycline about 3 weeks ago. Has a small area of induration that is healing on her right mid back. Left knee and ankle are painful red pain or swelling. History of gout. Does feel similar. Is taking hydrochlorothiazide, and has for years without other flares of gout. No rash. No fever. No chest pain difficulty breathing. No neurological symptoms.  Past Medical History  Diagnosis Date  . Hypertension   . Anemia   . Neck strain 02/06/2012  . Graves' disease   . Positive TB test   . H/O hiatal hernia   . Migraine headache     "couple times/month" (02/12/2013)  . Chronic lower back pain    Past Surgical History  Procedure Laterality Date  . Cesarean section  1979 1986 1990  . Appendectomy  2006  . Abdominal hysterectomy  2006   Family History  Problem Relation Age of Onset  . Heart disease Other   . Diabetes Other   . Heart disease Mother     CAB age 23  . Diabetes Mother   . Cancer Father 31    colon cancer  . Stroke Father   . Diabetes Sister   . Kidney disease Sister     renal failure  . Diabetes Brother   . Kidney disease Brother     renal failure   History  Substance Use Topics  . Smoking status: Current Every Day Smoker -- 0.25 packs/day for 30 years    Types: Cigarettes  . Smokeless tobacco: Never Used  . Alcohol Use: No   OB History   Grav Para Term Preterm Abortions TAB SAB Ect Mult Living                 Review of Systems  Constitutional: Negative for fever, chills, diaphoresis, appetite change and fatigue.  HENT: Negative for mouth sores, sore throat and trouble swallowing.   Eyes: Negative for  visual disturbance.  Respiratory: Negative for cough, chest tightness, shortness of breath and wheezing.   Cardiovascular: Negative for chest pain.  Gastrointestinal: Negative for nausea, vomiting, abdominal pain, diarrhea and abdominal distention.  Endocrine: Negative for polydipsia, polyphagia and polyuria.  Genitourinary: Negative for dysuria, frequency and hematuria.  Musculoskeletal: Positive for arthralgias. Negative for gait problem.       Complains of pain in the left knee, left ankle.  Skin: Negative for color change, pallor and rash.       Healing tick bite to the right mid back/flank  Neurological: Negative for dizziness, syncope, light-headedness and headaches.  Hematological: Does not bruise/bleed easily.  Psychiatric/Behavioral: Negative for behavioral problems and confusion.      Allergies  Clonidine derivatives; Aspirin; and Latex  Home Medications   Prior to Admission medications   Medication Sig Start Date End Date Taking? Authorizing Provider  amLODipine (NORVASC) 10 MG tablet Take 1 tablet (10 mg total) by mouth at bedtime. 05/30/13 05/30/14 Yes Phill Myron, MD  fexofenadine (ALLEGRA) 60 MG tablet Take 1 tablet (60 mg total) by mouth daily. 10/27/13  Yes Wandra Arthurs,  MD  levothyroxine (SYNTHROID, LEVOTHROID) 100 MCG tablet Take 1 tablet (100 mcg total) by mouth every morning. 10/23/12  Yes Coral Spikes, DO  lisinopril-hydrochlorothiazide (PRINZIDE,ZESTORETIC) 20-25 MG per tablet Take 1 tablet by mouth daily. 05/30/13  Yes Phill Myron, MD  metoprolol tartrate (LOPRESSOR) 25 MG tablet Take 0.5 tablets (12.5 mg total) by mouth 2 (two) times daily. 05/30/13 05/30/14 Yes Phill Myron, MD  Multiple Vitamin (MULTIVITAMIN WITH MINERALS) TABS tablet Take 1 tablet by mouth daily.   Yes Historical Provider, MD  potassium chloride SA (K-DUR,KLOR-CON) 20 MEQ tablet Take 20 mEq by mouth daily.   Yes Historical Provider, MD  spironolactone (ALDACTONE) 25 MG tablet Take 1 tablet (25 mg  total) by mouth 2 (two) times daily. 05/30/13  Yes Phill Myron, MD  cephALEXin (KEFLEX) 500 MG capsule Take 1 capsule (500 mg total) by mouth 4 (four) times daily. 12/06/13   Heather Laisure, PA-C  doxycycline (VIBRAMYCIN) 100 MG capsule Take 1 capsule (100 mg total) by mouth 2 (two) times daily. One po bid x 7 days 12/01/13   Clayton Bibles, PA-C   BP 182/96  Temp(Src) 98.3 F (36.8 C) (Oral)  Resp 15  SpO2 98% Physical Exam  Constitutional: She is oriented to person, place, and time. She appears well-developed and well-nourished. No distress.  HENT:  Head: Normocephalic.  Eyes: Conjunctivae are normal. Pupils are equal, round, and reactive to light. No scleral icterus.  Neck: Normal range of motion. Neck supple. No thyromegaly present.  Cardiovascular: Normal rate and regular rhythm.  Exam reveals no gallop and no friction rub.   No murmur heard. Pulmonary/Chest: Effort normal and breath sounds normal. No respiratory distress. She has no wheezes. She has no rales.  Abdominal: Soft. Bowel sounds are normal. She exhibits no distension. There is no tenderness. There is no rebound.  Musculoskeletal: Normal range of motion.       Back:       Legs: Neurological: She is alert and oriented to person, place, and time.  Skin: Skin is warm and dry. No rash noted.  Psychiatric: She has a normal mood and affect. Her behavior is normal.    ED Course  Procedures (including critical care time) Labs Review Labs Reviewed  BASIC METABOLIC PANEL - Abnormal; Notable for the following:    Potassium 3.4 (*)    GFR calc non Af Amer 70 (*)    GFR calc Af Amer 81 (*)    All other components within normal limits  CBC WITH DIFFERENTIAL  SEDIMENTATION RATE  ROCKY MTN SPOTTED FVR AB, IGG-BLOOD  ROCKY MTN SPOTTED FVR AB, IGM-BLOOD    Imaging Review Dg Ankle Complete Left  12/19/2013   CLINICAL DATA:  Lateral left ankle swelling. No reported history of trauma.  EXAM: LEFT ANKLE COMPLETE - 3+ VIEW   COMPARISON:  None.  FINDINGS: There is asymmetric lateral malleolar soft tissue swelling without underlying acute osseous abnormality. The mortise is symmetric. Tiny plantar calcaneal spur noted. No radiopaque foreign body.  IMPRESSION: Lateral malleolar soft tissue swelling which is of unclear clinical significance and could indicate ligamentous injury although there is a reported absence of trauma. Clinical correlation is recommended to determine whether a palpable mass is present.   Electronically Signed   By: Conchita Paris M.D.   On: 12/19/2013 11:32   Dg Knee Complete 4 Views Left  12/19/2013   CLINICAL DATA:  Left knee pain  EXAM: LEFT KNEE - COMPLETE 4+ VIEW  COMPARISON:  07/28/2013  FINDINGS:  Moderate joint effusion is identified. Mild irregularity in the lateral tibial plateau is noted suggestive of prior healed fracture. This was not well appreciated on the prior exam. No acute fracture or dislocation is seen.  IMPRESSION: Irregularity of the lateral tibial plateau which may be related to healed fracture.  Moderate joint effusion.   Electronically Signed   By: Inez Catalina M.D.   On: 12/19/2013 11:33     EKG Interpretation None      MDM   Final diagnoses:  Pain of left lower extremity  Acute idiopathic gout of left knee  Tibial plateau fracture, left, sequela    Patient's x-rays show old healing fracture. Does show joint effusion. Centesis was attempted and no fluid was able to be aspirated from the joint despite using approach with 18-gauge needle. Plan will be immobilizer and crutches. Minimize weightbearing. Orthopedic followup. Treatment for gout with colchicine, indomethacin, Percocet.    Tanna Furry, MD 12/19/13 1236

## 2013-12-19 NOTE — Progress Notes (Signed)
Orthopedic Tech Progress Note Patient Details:  Teresa [name] October 06, 1960 [id number] Knee immobilizer applied; cruthces fit for height and comfort Ortho Devices Type of Ortho Device: Crutches;Knee Immobilizer Ortho Device/Splint Location: LLE Ortho Device/Splint Interventions: Application   Asia R Thompson 12/19/2013, 12:50 PM

## 2013-12-19 NOTE — Discharge Instructions (Signed)
Your x rays show an old, healed fracture of the tibia (lower bone in the knee). You can work, and bear weight as tolerated on the leg. Use the immobilizer, and walk with crutches until your pain is better.  Gout Gout is when your joints become red, sore, and swell (inflammed). This is caused by the buildup of uric acid crystals in the joints. Uric acid is a chemical that is normally in the blood. If the level of uric acid gets too high in the blood, these crystals form in your joints and tissues. Over time, these crystals can form into masses near the joints and tissues. These masses can destroy bone and cause the bone to look misshapen (deformed). HOME CARE   Do not take aspirin for pain.  Only take medicine as told by your doctor.  Rest the joint as much as you can. When in bed, keep sheets and blankets off painful areas.  Keep the sore joints raised (elevated).  Put warm or cold packs on painful joints. Use of warm or cold packs depends on which works best for you.  Use crutches if the painful joint is in your leg.  Drink enough fluids to keep your pee (urine) clear or pale yellow. Limit alcohol, sugary drinks, and drinks with fructose in them.  Follow your diet instructions. Pay careful attention to how much protein you eat. Include fruits, vegetables, whole grains, and fat-free or low-fat milk products in your daily diet. Talk to your doctor or dietician about the use of coffee, vitamin C, and cherries. These may help lower uric acid levels.  Keep a healthy body weight. GET HELP RIGHT AWAY IF:   You have watery poop (diarrhea), throw up (vomit), or have any side effects from medicines.  You do not feel better in 24 hours, or you are getting worse.  Your joint becomes suddenly more tender, and you have chills or a fever. MAKE SURE YOU:   Understand these instructions.  Will watch your condition.  Will get help right away if you are not doing well or get worse. Document  Released: 03/22/2008 Document Revised: 10/08/2012 Document Reviewed: 09/21/2009 [facility] Patient Information 2015 [place], [place]. This information is not intended to replace advice given to you by your health care provider. Make sure you discuss any questions you have with your health care provider.

## 2013-12-20 LAB — ROCKY MTN SPOTTED FVR AB, IGM-BLOOD: RMSF IgM: 0.74 IV (ref 0.00–0.89)

## 2013-12-20 LAB — ROCKY MTN SPOTTED FVR AB, IGG-BLOOD: RMSF IgG: 0.16 IV

## 2013-12-20 NOTE — Discharge Planning (Signed)
[facility] was not able to see patient, [facility] orange card information and primary resource guide will be sent to the address provided in EPIC.

## 2014-01-22 ENCOUNTER — Ambulatory Visit: Payer: Self-pay

## 2014-01-27 ENCOUNTER — Emergency Department (HOSPITAL_COMMUNITY)
Admission: EM | Admit: 2014-01-27 | Discharge: 2014-01-27 | Disposition: A | Discharge status: Home / Self Care | Payer: Self-pay | Attending: Emergency Medicine | Admitting: Emergency Medicine

## 2014-01-27 ENCOUNTER — Ambulatory Visit (INDEPENDENT_AMBULATORY_CARE_PROVIDER_SITE_OTHER): Payer: Self-pay | Admitting: Family Medicine

## 2014-01-27 ENCOUNTER — Other Ambulatory Visit: Payer: Self-pay

## 2014-01-27 ENCOUNTER — Encounter (HOSPITAL_COMMUNITY): Payer: Self-pay | Admitting: Emergency Medicine

## 2014-01-27 ENCOUNTER — Ambulatory Visit (HOSPITAL_COMMUNITY)
Admission: RE | Admit: 2014-01-27 | Discharge: 2014-01-27 | Disposition: A | Discharge status: Home / Self Care | Payer: Self-pay | Source: Ambulatory Visit | Attending: Family Medicine | Admitting: Family Medicine

## 2014-01-27 ENCOUNTER — Emergency Department (HOSPITAL_COMMUNITY): Payer: Self-pay

## 2014-01-27 ENCOUNTER — Encounter: Payer: Self-pay | Admitting: Family Medicine

## 2014-01-27 VITALS — BP 193/108 | HR 62 | Temp 98.2°F | Wt 187.0 lb

## 2014-01-27 DIAGNOSIS — R12 Heartburn: Secondary | ICD-10-CM | POA: Insufficient documentation

## 2014-01-27 DIAGNOSIS — R059 Cough, unspecified: Secondary | ICD-10-CM | POA: Insufficient documentation

## 2014-01-27 DIAGNOSIS — E0500 Thyrotoxicosis with diffuse goiter without thyrotoxic crisis or storm: Secondary | ICD-10-CM | POA: Insufficient documentation

## 2014-01-27 DIAGNOSIS — Z79899 Other long term (current) drug therapy: Secondary | ICD-10-CM | POA: Insufficient documentation

## 2014-01-27 DIAGNOSIS — G43909 Migraine, unspecified, not intractable, without status migrainosus: Secondary | ICD-10-CM | POA: Insufficient documentation

## 2014-01-27 DIAGNOSIS — Z8719 Personal history of other diseases of the digestive system: Secondary | ICD-10-CM | POA: Insufficient documentation

## 2014-01-27 DIAGNOSIS — F17200 Nicotine dependence, unspecified, uncomplicated: Secondary | ICD-10-CM | POA: Insufficient documentation

## 2014-01-27 DIAGNOSIS — D649 Anemia, unspecified: Secondary | ICD-10-CM | POA: Insufficient documentation

## 2014-01-27 DIAGNOSIS — G8929 Other chronic pain: Secondary | ICD-10-CM | POA: Insufficient documentation

## 2014-01-27 DIAGNOSIS — I10 Essential (primary) hypertension: Secondary | ICD-10-CM

## 2014-01-27 DIAGNOSIS — Z91040 Latex allergy status: Secondary | ICD-10-CM | POA: Insufficient documentation

## 2014-01-27 DIAGNOSIS — R0789 Other chest pain: Secondary | ICD-10-CM | POA: Insufficient documentation

## 2014-01-27 DIAGNOSIS — R079 Chest pain, unspecified: Secondary | ICD-10-CM

## 2014-01-27 DIAGNOSIS — E039 Hypothyroidism, unspecified: Secondary | ICD-10-CM

## 2014-01-27 DIAGNOSIS — R05 Cough: Secondary | ICD-10-CM | POA: Insufficient documentation

## 2014-01-27 LAB — CBG MONITORING, ED: Glucose-Capillary: 90 mg/dL (ref 70–99)

## 2014-01-27 LAB — URINALYSIS, ROUTINE W REFLEX MICROSCOPIC
Bilirubin Urine: NEGATIVE
Glucose, UA: NEGATIVE mg/dL
Ketones, ur: NEGATIVE mg/dL
Leukocytes, UA: NEGATIVE
Nitrite: NEGATIVE
Protein, ur: NEGATIVE mg/dL
Specific Gravity, Urine: 1.018 (ref 1.005–1.030)
Urobilinogen, UA: 1 mg/dL (ref 0.0–1.0)
pH: 6.5 (ref 5.0–8.0)

## 2014-01-27 LAB — I-STAT TROPONIN, ED: Troponin i, poc: 0 ng/mL (ref 0.00–0.08)

## 2014-01-27 LAB — CBC WITH DIFFERENTIAL/PLATELET
Basophils Absolute: 0.1 10*3/uL (ref 0.0–0.1)
Basophils Relative: 1 % (ref 0–1)
Eosinophils Absolute: 0.3 10*3/uL (ref 0.0–0.7)
Eosinophils Relative: 4 % (ref 0–5)
HCT: 41.6 % (ref 36.0–46.0)
Hemoglobin: 13.9 g/dL (ref 12.0–15.0)
Lymphocytes Relative: 39 % (ref 12–46)
Lymphs Abs: 2.7 10*3/uL (ref 0.7–4.0)
MCH: 27.2 pg (ref 26.0–34.0)
MCHC: 33.4 g/dL (ref 30.0–36.0)
MCV: 81.4 fL (ref 78.0–100.0)
Monocytes Absolute: 0.3 10*3/uL (ref 0.1–1.0)
Monocytes Relative: 4 % (ref 3–12)
Neutro Abs: 3.7 10*3/uL (ref 1.7–7.7)
Neutrophils Relative %: 52 % (ref 43–77)
Platelets: 235 10*3/uL (ref 150–400)
RBC: 5.11 MIL/uL (ref 3.87–5.11)
RDW: 13.7 % (ref 11.5–15.5)
WBC: 7 10*3/uL (ref 4.0–10.5)

## 2014-01-27 LAB — BASIC METABOLIC PANEL
Anion gap: 12 (ref 5–15)
BUN: 12 mg/dL (ref 6–23)
CO2: 23 mEq/L (ref 19–32)
Calcium: 9 mg/dL (ref 8.4–10.5)
Chloride: 105 mEq/L (ref 96–112)
Creatinine, Ser: 0.79 mg/dL (ref 0.50–1.10)
GFR calc Af Amer: >90 mL/min (ref >90)
GFR calc non Af Amer: >90 mL/min (ref >90)
Glucose, Bld: 89 mg/dL (ref 70–99)
Potassium: 3.9 mEq/L (ref 3.7–5.3)
Sodium: 140 mEq/L (ref 137–147)

## 2014-01-27 LAB — URINE MICROSCOPIC-ADD ON

## 2014-01-27 MED ORDER — LISINOPRIL-HYDROCHLOROTHIAZIDE 20-25 MG PO TABS: 1.0000 | ORAL_TABLET | Freq: Every day | ORAL | Status: DC

## 2014-01-27 MED ORDER — LABETALOL HCL 5 MG/ML IV SOLN
20.0000 mg | Freq: Once | INTRAVENOUS | Status: AC
  Administered 2014-01-27: 10 mg via INTRAVENOUS
  Filled 2014-01-27: qty 4

## 2014-01-27 MED ORDER — SPIRONOLACTONE 25 MG PO TABS: 25.0000 mg | ORAL_TABLET | Freq: Two times a day (BID) | ORAL | Status: DC

## 2014-01-27 MED ORDER — METOPROLOL TARTRATE 25 MG PO TABS: 12.5000 mg | ORAL_TABLET | Freq: Two times a day (BID) | ORAL | Status: DC

## 2014-01-27 MED ORDER — MORPHINE SULFATE 4 MG/ML IJ SOLN: 4.0000 mg | Freq: Once | INTRAMUSCULAR | Status: DC

## 2014-01-27 MED ORDER — AMLODIPINE BESYLATE 10 MG PO TABS: 10.0000 mg | ORAL_TABLET | Freq: Every day | ORAL | Status: DC

## 2014-01-27 NOTE — Discharge Instructions (Signed)

## 2014-01-27 NOTE — Assessment & Plan Note (Signed)
HTN urgency, See A/P for CP - Refilled BP meds; re-eval in 1 week

## 2014-01-27 NOTE — Assessment & Plan Note (Addendum)
Pertinent S&O  Chest pain "tightness" x 2 days since being out of several BP meds; BP elevated today 193/108  Associated with SOB  Current smoker  No previous MI;  Assessment  HTN Urgency  EKG unchanged from previous except for sinus bradycardia; LVH present with t-wave inversions in V5-V6.  Plan  Patient transported to ED for Chest pain eval / ACS rule-out  Patient offered ASA 325mg  but she declined until she gets to the ED stating "it makes me vomit some times"  Refilled BP medications and stressed importance of compliance given CKD stage 2   She was instructed to follow-up with me in 1 week for BP check; If BP remains elevated after compliance with medications will concern evaluation for secondary HTN  Greater than 30 minutes was spent with patient discussing complications, prognosis and treatment/evaluation options

## 2014-01-27 NOTE — ED Provider Notes (Signed)
CSN: [id number]     Arrival date & time 01/27/14  1214 History   First MD Initiated Contact with Patient 01/27/14 1224     Chief Complaint  Patient presents with  . Blurred Vision  . Chest Pain  . Cough   Teresa [name] is a 53 yo AAF w/PMH of HTN, hiatal hernia and GERD who presents from PcPs' office today w/HTN and chest pressure. Pt says her chest pressure has been going on constantly for over 1 week now. It feels like her hiatal hernia is acting up. She can usually take warm tea w/resolution but this has not helped. Pt complains that yesterday she felt dizzy and lightheaded but never fainted. At PCP's office was found to be very hypertensive. Admits she has been out of her medications for a few days. She takes 4 BP meds.   She denies palpitations, LE edema, DOE, SOB, fever, chills, constipation, hematemesis, dysuria, hematuria, sick contacts, or recent travel.   (Consider location/radiation/quality/duration/timing/severity/associated sxs/prior Treatment) Patient is a 53 y.o. female presenting with chest pain. The history is provided by the patient.  Chest Pain Pain location:  Substernal area Pain quality: pressure   Pain radiates to:  Does not radiate Pain radiates to the back: no   Pain severity:  Moderate Onset quality:  Gradual Duration:  10 days Timing:  Constant Progression:  Waxing and waning Chronicity:  New Associated symptoms: cough, dizziness and heartburn   Associated symptoms: no abdominal pain, no back pain, no diaphoresis, no fatigue, no fever, no nausea, no numbness, no palpitations, no PND, no shortness of breath and no weakness   Risk factors: hypertension   Risk factors: no coronary artery disease, no diabetes mellitus and no prior DVT/PE     Past Medical History  Diagnosis Date  . Hypertension   . Anemia   . Neck strain 02/06/2012  . Graves' disease   . Positive TB test   . H/O hiatal hernia   . Migraine headache     "couple times/month" (02/12/2013)  .  Chronic lower back pain    Past Surgical History  Procedure Laterality Date  . Cesarean section  1979 1986 1990  . Appendectomy  2006  . Abdominal hysterectomy  2006   Family History  Problem Relation Age of Onset  . Heart disease Other   . Diabetes Other   . Heart disease Mother     CAB age 32  . Diabetes Mother   . Cancer Father 26    colon cancer  . Stroke Father   . Diabetes Sister   . Kidney disease Sister     renal failure  . Diabetes Brother   . Kidney disease Brother     renal failure   History  Substance Use Topics  . Smoking status: Current Every Day Smoker -- 0.25 packs/day for 30 years    Types: Cigarettes  . Smokeless tobacco: Never Used  . Alcohol Use: No   OB History   Grav Para Term Preterm Abortions TAB SAB Ect Mult Living                 Review of Systems  Unable to perform ROS Constitutional: Negative for fever, diaphoresis and fatigue.  Respiratory: Positive for cough and chest tightness. Negative for apnea, choking, shortness of breath, wheezing and stridor.   Cardiovascular: Positive for chest pain. Negative for palpitations, leg swelling and PND.  Gastrointestinal: Positive for heartburn. Negative for nausea, abdominal pain, diarrhea, constipation and abdominal distention.  Musculoskeletal: Negative for back pain.  Neurological: Positive for dizziness. Negative for weakness and numbness.  All other systems reviewed and are negative.     Allergies  Clonidine derivatives; Aspirin; and Latex  Home Medications   Prior to Admission medications   Medication Sig Start Date End Date Taking? Authorizing Provider  amLODipine (NORVASC) 10 MG tablet Take 1 tablet (10 mg total) by mouth at bedtime. 01/27/14 01/27/15 Yes Phill Myron, MD  Cyanocobalamin (VITAMIN B 12 PO) Take 1 tablet by mouth daily.   Yes Historical Provider, MD  fexofenadine (ALLEGRA) 60 MG tablet Take 1 tablet (60 mg total) by mouth daily. 10/27/13  Yes Wandra Arthurs, MD  levothyroxine  (SYNTHROID, LEVOTHROID) 100 MCG tablet Take 1 tablet (100 mcg total) by mouth every morning. 10/23/12  Yes Coral Spikes, DO  lisinopril-hydrochlorothiazide (PRINZIDE,ZESTORETIC) 20-25 MG per tablet Take 1 tablet by mouth daily. 01/27/14  Yes Phill Myron, MD  loratadine (CLARITIN) 10 MG tablet Take 10 mg by mouth daily.   Yes Historical Provider, MD  metoprolol tartrate (LOPRESSOR) 25 MG tablet Take 0.5 tablets (12.5 mg total) by mouth 2 (two) times daily. 01/27/14 01/27/15 Yes Phill Myron, MD  Multiple Vitamin (MULTIVITAMIN WITH MINERALS) TABS tablet Take 1 tablet by mouth daily.   Yes Historical Provider, MD  spironolactone (ALDACTONE) 25 MG tablet Take 1 tablet (25 mg total) by mouth 2 (two) times daily. 01/27/14  Yes Phill Myron, MD   BP 209/92  Pulse 66  Resp 17  SpO2 90% Physical Exam  Nursing note and vitals reviewed. Constitutional: She is oriented to person, place, and time. No distress.  Cardiovascular: Normal rate, regular rhythm, normal heart sounds and intact distal pulses.  Exam reveals no gallop and no friction rub.   No murmur heard. Pulmonary/Chest: Effort normal and breath sounds normal. No respiratory distress. She has no wheezes. She has no rales. She exhibits tenderness (lower substernal).  Abdominal: Soft. Bowel sounds are normal. She exhibits no distension and no mass. There is no tenderness. There is no rebound and no guarding.  Musculoskeletal: Normal range of motion. She exhibits no edema and no tenderness.  Neurological: She is alert and oriented to person, place, and time.  Skin: She is not diaphoretic.    ED Course  Procedures (including critical care time) Labs Review Labs Reviewed  URINALYSIS, ROUTINE W REFLEX MICROSCOPIC - Abnormal; Notable for the following:    APPearance HAZY (*)    Hgb urine dipstick TRACE (*)    All other components within normal limits  URINE MICROSCOPIC-ADD ON - Abnormal; Notable for the following:    Squamous Epithelial / LPF FEW (*)     Bacteria, UA FEW (*)    All other components within normal limits  CBC WITH DIFFERENTIAL  BASIC METABOLIC PANEL  CBG MONITORING, ED  Randolm Idol, ED    Imaging Review Dg Chest 2 View  01/27/2014   CLINICAL DATA:  Shortness of breath, chest pain  EXAM: CHEST  2 VIEW  COMPARISON:  10/27/2013  FINDINGS: Heart is borderline in size. Lungs are clear. No effusion or pneumothorax. No acute bony abnormality.  IMPRESSION: Borderline heart size.  No active disease.   Electronically Signed   By: Rolm Baptise M.D.   On: 01/27/2014 13:27     EKG Interpretation None      MDM   53 yo AAF here w/chest pressure and HTN. Please see HPI for details. AF. Vitals show HTN at 200/100s. Concern for ACS vs HTN  urgency/emergency. No focal neural deficits. Vision intact bilaterally. Will obtain EKG, CXR, CBC, BMP, and troponin to look for any end-organ signs / ACS. EKG w/no sign of ischemia or arrhythmia.  Given labetalol and BP down to 308M systolic. Labs wnl.  No sign of end-organ damage. Trop at 0.00. Renal function wnl. DCed home w/follow up to PCP w/in 1 week to ensure BP medications are controlling HTN. PCP refilled her BP meds today.  Strict return precautions include worsening CP, SOB, worst HA of life, decreased UOP or darker than normal urine.   Final diagnoses:  Essential hypertension    Pt was seen under the supervision of Dr. Ashok Cordia.     Sherian Maroon, MD 01/27/14 1730

## 2014-01-27 NOTE — Progress Notes (Signed)
   Subjective:    Patient ID: Teresa [name], female    DOB: Jun 30, 1960, 53 y.o.   MRN: [id number]  HPI Comments: Teresa [name] comes in today for evaluation of multiple issues.  She complains today of chest pain, and being out of her blood pressure medications (except Metoprolol) for 2 days. She reports substernal tightness that has been going on for the past 2 days.  Pain does not radiate.  She does endorse shortness of breath, but questions whether this could be associated with previous "hiatal hernia."  She denies any diaphoresis or lower extremity swelling. However she does report lightheadedness yesterday and presyncopal symptoms.  She continues to smoke. She reports being compliant with her Synthroid over the past several months. She reports difficult with affording medications and says she only qualified for Pink card for medication assistance.   She is also concerned about her ongoing left knee pain which has been previously evaluated by the ED and is still have pain and giving out; I deferred evaluation of her knee and asked her to schedule appointment next visit to discuss it.      Review of Systems  Constitutional: Negative for fever, chills and unexpected weight change.  Eyes: Positive for visual disturbance.  Respiratory: Positive for chest tightness and shortness of breath. Negative for cough and wheezing.   Cardiovascular: Positive for chest pain. Negative for palpitations and leg swelling.  Gastrointestinal: Positive for abdominal pain. Negative for nausea and vomiting.  Musculoskeletal: Positive for arthralgias.  Skin: Negative for pallor.       Objective:   Physical Exam  Constitutional: She is oriented to person, place, and time. She appears well-developed and well-nourished. No distress.  Eyes: Pupils are equal, round, and reactive to light.  Cardiovascular: Normal rate, regular rhythm and normal heart sounds.   No murmur heard. Pulmonary/Chest: Effort normal and breath  sounds normal. No respiratory distress. She has no wheezes. She exhibits no tenderness.  Abdominal: Soft. Bowel sounds are normal. There is no rebound and no guarding.  Neurological: She is alert and oriented to person, place, and time.  Skin: No rash noted. She is not diaphoretic. No pallor.     Assessment/Plan:      See Problem Focused Assessment & Plan

## 2014-01-27 NOTE — Progress Notes (Signed)
  CARE MANAGEMENT ED NOTE 01/27/2014  Patient:  Teresa [name], Teresa [name]   Account Number:  [id number]  Date Initiated:  01/27/2014  Documentation initiated by:  Edwyna Shell  Subjective/Objective Assessment:   53 yo presenting to the ED with HA and blurred vision     Subjective/Objective Assessment Detail:     Action/Plan:   Patient will continue to follow up with [facility] and use the [facility] $4 list for medication refills   Action/Plan Detail:   Anticipated DC Date:       Status Recommendation to Physician:   Result of Recommendation:  Agreed    DC Planning Services  CM consult  Other    Choice offered to / List presented to:  C-1 Patient          Status of service:  Completed, signed off  ED Comments:   ED Comments Detail:  CM consulted to assit with medications. CM spoke with the patient regarding the ED visit. She stated that she has been out of her medications becasue she has not been able to afford them. She did state that she is a patient at [facility] and she also stated that she gets most of her medications from [place] off the $4 list. The patient stated that she is not eligible for the orange card which is 100% coverage but she has the "pink card" which she states is 75% coverage. This CM provided the patient with the [facility] $4 medication list and recommended that is she is unable to afford the 90 day supply, which she stated [facility] usually prescribes, to then let them know to only do 30 days at a time for affordability. Patient verbalized understanding and had no other questions or concerns.

## 2014-01-27 NOTE — Assessment & Plan Note (Signed)
She reports compliance with Synthroid - Will recheck TSH next week in clinic if not evaluated in ED today

## 2014-01-27 NOTE — ED Notes (Signed)
Per [facility]: pt from [facility] c/o blurred vision x 2 days with some intermittent CP at times and cough; pt noted htn at PCP office; IV 22g R wrist

## 2014-01-28 ENCOUNTER — Other Ambulatory Visit: Payer: Self-pay | Admitting: Family Medicine

## 2014-01-28 ENCOUNTER — Telehealth: Payer: Self-pay | Admitting: Family Medicine

## 2014-01-28 DIAGNOSIS — I10 Essential (primary) hypertension: Secondary | ICD-10-CM

## 2014-01-28 MED ORDER — AMLODIPINE BESYLATE 10 MG PO TABS: 10.0000 mg | ORAL_TABLET | Freq: Every day | ORAL | Status: DC

## 2014-01-28 NOTE — Telephone Encounter (Signed)
Refill call in to pharmacy.

## 2014-01-28 NOTE — Telephone Encounter (Signed)
Pt called and needs to have her Amlodipine called in to the [facility] on [address]. jw

## 2014-01-28 NOTE — ED Provider Notes (Signed)
I saw and evaluated the patient, reviewed the resident's note and I agree with the findings and plan.  Pt w hx htn, recent non compliance w rx, c/o very high blood pressure. Denies headache. No cp. No numbness/weakness. Labs. Will provide rx w her bp meds.    Mirna Mires, MD 01/28/14 [phone]

## 2014-02-11 ENCOUNTER — Encounter: Payer: Self-pay | Admitting: Family Medicine

## 2014-02-11 ENCOUNTER — Encounter (HOSPITAL_COMMUNITY): Payer: Self-pay | Admitting: Emergency Medicine

## 2014-02-11 ENCOUNTER — Emergency Department (HOSPITAL_COMMUNITY): Payer: Self-pay

## 2014-02-11 ENCOUNTER — Ambulatory Visit (INDEPENDENT_AMBULATORY_CARE_PROVIDER_SITE_OTHER): Payer: Self-pay | Admitting: Family Medicine

## 2014-02-11 ENCOUNTER — Emergency Department (HOSPITAL_COMMUNITY)
Admission: EM | Admit: 2014-02-11 | Discharge: 2014-02-11 | Disposition: A | Discharge status: Home / Self Care | Payer: Self-pay | Attending: Emergency Medicine | Admitting: Emergency Medicine

## 2014-02-11 VITALS — BP 152/87 | HR 66 | Temp 98.2°F | Wt 185.0 lb

## 2014-02-11 DIAGNOSIS — G43909 Migraine, unspecified, not intractable, without status migrainosus: Secondary | ICD-10-CM | POA: Insufficient documentation

## 2014-02-11 DIAGNOSIS — M25562 Pain in left knee: Secondary | ICD-10-CM

## 2014-02-11 DIAGNOSIS — G8929 Other chronic pain: Secondary | ICD-10-CM

## 2014-02-11 DIAGNOSIS — X58XXXA Exposure to other specified factors, initial encounter: Secondary | ICD-10-CM | POA: Insufficient documentation

## 2014-02-11 DIAGNOSIS — Z79899 Other long term (current) drug therapy: Secondary | ICD-10-CM | POA: Insufficient documentation

## 2014-02-11 DIAGNOSIS — M25559 Pain in unspecified hip: Secondary | ICD-10-CM | POA: Insufficient documentation

## 2014-02-11 DIAGNOSIS — M25569 Pain in unspecified knee: Secondary | ICD-10-CM

## 2014-02-11 DIAGNOSIS — Z91040 Latex allergy status: Secondary | ICD-10-CM | POA: Insufficient documentation

## 2014-02-11 DIAGNOSIS — M542 Cervicalgia: Secondary | ICD-10-CM

## 2014-02-11 DIAGNOSIS — M25551 Pain in right hip: Secondary | ICD-10-CM | POA: Insufficient documentation

## 2014-02-11 DIAGNOSIS — Y939 Activity, unspecified: Secondary | ICD-10-CM | POA: Insufficient documentation

## 2014-02-11 DIAGNOSIS — R1013 Epigastric pain: Secondary | ICD-10-CM

## 2014-02-11 DIAGNOSIS — F17200 Nicotine dependence, unspecified, uncomplicated: Secondary | ICD-10-CM | POA: Insufficient documentation

## 2014-02-11 DIAGNOSIS — E039 Hypothyroidism, unspecified: Secondary | ICD-10-CM

## 2014-02-11 DIAGNOSIS — Y929 Unspecified place or not applicable: Secondary | ICD-10-CM | POA: Insufficient documentation

## 2014-02-11 DIAGNOSIS — I10 Essential (primary) hypertension: Secondary | ICD-10-CM

## 2014-02-11 DIAGNOSIS — S76011A Strain of muscle, fascia and tendon of right hip, initial encounter: Secondary | ICD-10-CM

## 2014-02-11 DIAGNOSIS — IMO0002 Reserved for concepts with insufficient information to code with codable children: Secondary | ICD-10-CM | POA: Insufficient documentation

## 2014-02-11 DIAGNOSIS — D649 Anemia, unspecified: Secondary | ICD-10-CM | POA: Insufficient documentation

## 2014-02-11 HISTORY — DX: Cervicalgia: M54.2

## 2014-02-11 LAB — CBC WITH DIFFERENTIAL/PLATELET
Basophils Absolute: 0.1 K/uL (ref 0.0–0.1)
Basophils Relative: 1 % (ref 0–1)
Eosinophils Absolute: 0.4 K/uL (ref 0.0–0.7)
Eosinophils Relative: 6 % — ABNORMAL HIGH (ref 0–5)
HCT: 44 % (ref 36.0–46.0)
Hemoglobin: 14.6 g/dL (ref 12.0–15.0)
Lymphocytes Relative: 42 % (ref 12–46)
Lymphs Abs: 2.6 K/uL (ref 0.7–4.0)
MCH: 26.6 pg (ref 26.0–34.0)
MCHC: 33.2 g/dL (ref 30.0–36.0)
MCV: 80.1 fL (ref 78.0–100.0)
Monocytes Absolute: 0.3 K/uL (ref 0.1–1.0)
Monocytes Relative: 4 % (ref 3–12)
Neutro Abs: 3 K/uL (ref 1.7–7.7)
Neutrophils Relative %: 47 % (ref 43–77)
Platelets: 279 K/uL (ref 150–400)
RBC: 5.49 MIL/uL — ABNORMAL HIGH (ref 3.87–5.11)
RDW: 13.5 % (ref 11.5–15.5)
WBC: 6.3 K/uL (ref 4.0–10.5)

## 2014-02-11 LAB — URINALYSIS, ROUTINE W REFLEX MICROSCOPIC
Bilirubin Urine: NEGATIVE
Glucose, UA: NEGATIVE mg/dL
Ketones, ur: NEGATIVE mg/dL
Leukocytes, UA: NEGATIVE
Nitrite: NEGATIVE
Protein, ur: NEGATIVE mg/dL
Specific Gravity, Urine: 1.013 (ref 1.005–1.030)
Urobilinogen, UA: 0.2 mg/dL (ref 0.0–1.0)
pH: 7 (ref 5.0–8.0)

## 2014-02-11 LAB — LIPID PANEL
Cholesterol: 143 mg/dL (ref 0–200)
HDL: 43 mg/dL (ref >39)
LDL Cholesterol: 90 mg/dL (ref 0–99)
Total CHOL/HDL Ratio: 3.3 Ratio
Triglycerides: 48 mg/dL (ref <150)
VLDL: 10 mg/dL (ref 0–40)

## 2014-02-11 LAB — URINE MICROSCOPIC-ADD ON

## 2014-02-11 LAB — TSH: TSH: 3.089 u[IU]/mL (ref 0.350–4.500)

## 2014-02-11 MED ORDER — PANTOPRAZOLE SODIUM 40 MG PO TBEC: 40.0000 mg | DELAYED_RELEASE_TABLET | Freq: Every day | ORAL | Status: DC

## 2014-02-11 MED ORDER — HYDROCODONE-ACETAMINOPHEN 5-325 MG PO TABS
1.0000 | ORAL_TABLET | ORAL | Status: DC | PRN
Start: 2014-02-11 — End: 2014-04-15

## 2014-02-11 MED ORDER — METHOCARBAMOL 500 MG PO TABS: 500.0000 mg | ORAL_TABLET | Freq: Two times a day (BID) | ORAL | Status: DC

## 2014-02-11 MED ORDER — NAPROXEN 500 MG PO TABS: 500.0000 mg | ORAL_TABLET | Freq: Two times a day (BID) | ORAL | Status: DC

## 2014-02-11 NOTE — Progress Notes (Signed)
Subjective:     Patient ID: Teresa [name], female   DOB: 01-09-1961, 53 y.o.   MRN: [id number]  HPI HTN: here for follow up after medication adjustment, denies any chest pain, SOB or palpitation. Hypothyroidism:Compliant with synthroid 100 mcg qd, here for follow up. Pelvic pain:C/O right hip pain on going for 2 days about 8/10 in severity, pain is worse with ambulation, she woke up with pain, she denies any injury.She does have pain medicine at home. Knee pain: Left knee pain has improved only a little but tolerable. More concern about her right hip pain today. Neck pain:C/O recurrent neck pain for months, this was initially mostly on the right side of her neck but now on the left. Pain is on and off currently asymptomatic. At times she will have firmness of her neck with what feels like a knot. Denies HA, no neck injury. She is concern about this and will like to get it checked out. Epigastric pain:C/O epigastric pain on and off for more than 1 month, now worsening over the last few days, she denies N/V, no constipation, she has occasional diarrhea,no blood in her stool. She feels like lump in her epigastric area with eating.  Current Outpatient Prescriptions on File Prior to Visit  Medication Sig Dispense Refill  . amLODipine (NORVASC) 10 MG tablet Take 1 tablet (10 mg total) by mouth at bedtime.  30 tablet  4  . Cyanocobalamin (VITAMIN B 12 PO) Take 1 tablet by mouth daily.      . fexofenadine (ALLEGRA) 60 MG tablet Take 1 tablet (60 mg total) by mouth daily.  30 tablet  0  . levothyroxine (SYNTHROID, LEVOTHROID) 100 MCG tablet Take 1 tablet (100 mcg total) by mouth every morning.  90 tablet  3  . lisinopril-hydrochlorothiazide (PRINZIDE,ZESTORETIC) 20-25 MG per tablet Take 1 tablet by mouth daily.  30 tablet  0  . metoprolol tartrate (LOPRESSOR) 25 MG tablet Take 0.5 tablets (12.5 mg total) by mouth 2 (two) times daily.  30 tablet  0  . Multiple Vitamin (MULTIVITAMIN WITH MINERALS) TABS tablet  Take 1 tablet by mouth daily.      Marland Kitchen spironolactone (ALDACTONE) 25 MG tablet Take 1 tablet (25 mg total) by mouth 2 (two) times daily.  30 tablet  0  . loratadine (CLARITIN) 10 MG tablet Take 10 mg by mouth daily.       No current facility-administered medications on file prior to visit.   Past Medical History  Diagnosis Date  . Hypertension   . Anemia   . Neck strain 02/06/2012  . Graves' disease   . Positive TB test   . H/O hiatal hernia   . Migraine headache     "couple times/month" (02/12/2013)  . Chronic lower back pain       Review of Systems  Respiratory: Negative.   Cardiovascular: Negative.   Gastrointestinal: Positive for diarrhea. Negative for vomiting, constipation, blood in stool and anal bleeding.       Epigastric pain  Genitourinary: Negative.   Musculoskeletal: Positive for arthralgias.       Right hip pain, left knee pain.  All other systems reviewed and are negative.  Filed Vitals:   02/11/14 0909  BP: 152/87  Pulse: 66  Temp: 98.2 F (36.8 C)  TempSrc: Oral  Weight: 185 lb (83.915 kg)       Objective:   Physical Exam  Nursing note and vitals reviewed. Constitutional: She appears well-developed. No distress.  Cardiovascular: Normal rate, regular rhythm  and normal heart sounds.   No murmur heard. Pulmonary/Chest: Effort normal and breath sounds normal. No respiratory distress. She has no wheezes. She exhibits no tenderness.  Abdominal: There is no hepatosplenomegaly. There is tenderness in the epigastric area. There is no rebound. No hernia.  Musculoskeletal:       Right hip: She exhibits decreased range of motion and tenderness. She exhibits no bony tenderness, no swelling and no crepitus.       Left hip: Normal.       Legs:      Assessment:     HTN: Hypothyroidism: Pelvic pain: Right Knee pain Neck pain Epigastric pain:    Plan:     Check problem list.

## 2014-02-11 NOTE — ED Notes (Signed)
Patient returned from xray.

## 2014-02-11 NOTE — Discharge Instructions (Signed)
Call your PCP regarding  Physical Therapy coverage.  Muscle Strain A muscle strain is an injury that occurs when a muscle is stretched beyond its normal length. Usually a small number of muscle fibers are torn when this happens. Muscle strain is rated in degrees. First-degree strains have the least amount of muscle fiber tearing and pain. Second-degree and third-degree strains have increasingly more tearing and pain.  Usually, recovery from muscle strain takes 1-2 weeks. Complete healing takes 5-6 weeks.  CAUSES  Muscle strain happens when a sudden, violent force placed on a muscle stretches it too far. This may occur with lifting, sports, or a fall.  RISK FACTORS Muscle strain is especially common in athletes.  SIGNS AND SYMPTOMS At the site of the muscle strain, there may be:  Pain.  Bruising.  Swelling.  Difficulty using the muscle due to pain or lack of normal function. DIAGNOSIS  Your health care provider will perform a physical exam and ask about your medical history. TREATMENT  Often, the best treatment for a muscle strain is resting, icing, and applying cold compresses to the injured area.  HOME CARE INSTRUCTIONS   Use the PRICE method of treatment to promote muscle healing during the first 2-3 days after your injury. The PRICE method involves:  Protecting the muscle from being injured again.  Restricting your activity and resting the injured body part.  Icing your injury. To do this, put ice in a plastic bag. Place a towel between your skin and the bag. Then, apply the ice and leave it on from 15-20 minutes each hour. After the third day, switch to moist heat packs.  Apply compression to the injured area with a splint or elastic bandage. Be careful not to wrap it too tightly. This may interfere with blood circulation or increase swelling.  Elevate the injured body part above the level of your heart as often as you can.  Only take over-the-counter or prescription  medicines for pain, discomfort, or fever as directed by your health care provider.  Warming up prior to exercise helps to prevent future muscle strains. SEEK MEDICAL CARE IF:   You have increasing pain or swelling in the injured area.  You have numbness, tingling, or a significant loss of strength in the injured area. MAKE SURE YOU:   Understand these instructions.  Will watch your condition.  Will get help right away if you are not doing well or get worse. Document Released: 06/13/2005 Document Revised: 04/03/2013 Document Reviewed: 01/10/2013 [facility] Patient Information 2015 [place], [place]. This information is not intended to replace advice given to you by your health care provider. Make sure you discuss any questions you have with your health care provider.

## 2014-02-11 NOTE — ED Notes (Signed)
Pt reports she woke up with her entire body hurting, now haivng continued back and hip pain. Seen her pmd and was sent here

## 2014-02-11 NOTE — Assessment & Plan Note (Signed)
Chronic but worsened over the last few days. Hx of GERD. Due to age and hx of chronic GERD she will benefit from EGD. Instruction was given on scheduling appointment with [facility] due to insurance. I also started her on protonix. F/U prn.

## 2014-02-11 NOTE — ED Provider Notes (Signed)
CSN: [id number]     Arrival date & time 02/11/14  0944 History   First MD Initiated Contact with Patient 02/11/14 1022     Chief Complaint  Patient presents with  . Hip Pain      HPI  Patient presents with right hip pain. Situate yes he was sore all over. She has a history of a previous knee injury with previous plateau fracture. She has not been seen by orthopedics in followup. Has received an orange card and [facility] She states she is to receive an orthopedic referral. Has intermittent swelling and intermittent pain causing limp in her left leg. 2 days ago she states she was sore all through her right side primarily her right leg. Today she has pain with lifting the right leg and points to her upper aspect of her right thigh as her area of pain.  Past Medical History  Diagnosis Date  . Hypertension   . Anemia   . Neck strain 02/06/2012  . Graves' disease   . Positive TB test   . H/O hiatal hernia   . Migraine headache     "couple times/month" (02/12/2013)  . Chronic lower back pain    Past Surgical History  Procedure Laterality Date  . Cesarean section  1979 1986 1990  . Appendectomy  2006  . Abdominal hysterectomy  2006   Family History  Problem Relation Age of Onset  . Heart disease Other   . Diabetes Other   . Heart disease Mother     CAB age 58  . Diabetes Mother   . Cancer Father 24    colon cancer  . Stroke Father   . Diabetes Sister   . Kidney disease Sister     renal failure  . Diabetes Brother   . Kidney disease Brother     renal failure   History  Substance Use Topics  . Smoking status: Current Every Day Smoker -- 0.25 packs/day for 30 years    Types: Cigarettes  . Smokeless tobacco: Never Used  . Alcohol Use: No   OB History   Grav Para Term Preterm Abortions TAB SAB Ect Mult Living                 Review of Systems  Constitutional: Negative for fever, chills, diaphoresis, appetite change and fatigue.  HENT:  Negative for mouth sores, sore throat and trouble swallowing.   Eyes: Negative for visual disturbance.  Respiratory: Negative for cough, chest tightness, shortness of breath and wheezing.   Cardiovascular: Negative for chest pain.  Gastrointestinal: Negative for nausea, vomiting, abdominal pain, diarrhea and abdominal distention.  Endocrine: Negative for polydipsia, polyphagia and polyuria.  Genitourinary: Negative for dysuria, frequency and hematuria.  Musculoskeletal: Positive for arthralgias and gait problem.  Skin: Negative for color change, pallor and rash.  Neurological: Negative for dizziness, syncope, light-headedness and headaches.  Hematological: Does not bruise/bleed easily.  Psychiatric/Behavioral: Negative for behavioral problems and confusion.      Allergies  Clonidine derivatives; Aspirin; and Latex  Home Medications   Prior to Admission medications   Medication Sig Start Date End Date Taking? Authorizing Provider  amLODipine (NORVASC) 10 MG tablet Take 1 tablet (10 mg total) by mouth at bedtime. 01/28/14 01/28/15 Yes [name], MD  Cholecalciferol (VITAMIN D PO) Take 1 tablet by mouth daily.   Yes Historical Provider, MD  Cyanocobalamin (VITAMIN B 12 PO) Take 1 tablet by mouth daily.   Yes Historical Provider,  MD  fexofenadine (ALLEGRA) 60 MG tablet Take 1 tablet (60 mg total) by mouth daily. 10/27/13  Yes Wandra Arthurs, MD  levothyroxine (SYNTHROID, LEVOTHROID) 100 MCG tablet Take 1 tablet (100 mcg total) by mouth every morning. 10/23/12  Yes Coral Spikes, DO  lisinopril-hydrochlorothiazide (PRINZIDE,ZESTORETIC) 20-25 MG per tablet Take 1 tablet by mouth daily. 01/27/14  Yes Olam Idler, MD  loratadine (CLARITIN) 10 MG tablet Take 10 mg by mouth daily.   Yes Historical Provider, MD  metoprolol tartrate (LOPRESSOR) 25 MG tablet Take 0.5 tablets (12.5 mg total) by mouth 2 (two) times daily. 01/27/14 01/27/15 Yes Olam Idler, MD  Multiple Vitamin (MULTIVITAMIN WITH MINERALS)  TABS tablet Take 1 tablet by mouth daily.   Yes Historical Provider, MD  spironolactone (ALDACTONE) 25 MG tablet Take 1 tablet (25 mg total) by mouth 2 (two) times daily. 01/27/14  Yes Olam Idler, MD  HYDROcodone-acetaminophen (NORCO/VICODIN) 5-325 MG per tablet Take 1 tablet by mouth every 4 (four) hours as needed. 02/11/14   Tanna Furry, MD  methocarbamol (ROBAXIN) 500 MG tablet Take 1 tablet (500 mg total) by mouth 2 (two) times daily. 02/11/14   Tanna Furry, MD  naproxen (NAPROSYN) 500 MG tablet Take 1 tablet (500 mg total) by mouth 2 (two) times daily. 02/11/14   Tanna Furry, MD   BP 160/87  Pulse 58  Temp(Src) 98.3 F (36.8 C) (Oral)  Resp 20  Ht 5\' 5"  (1.651 m)  Wt 184 lb (83.462 kg)  BMI 30.62 kg/m2  SpO2 100% Physical Exam  Constitutional: She is oriented to person, place, and time. She appears well-developed and well-nourished. No distress.  HENT:  Head: Normocephalic.  Eyes: Conjunctivae are normal. Pupils are equal, round, and reactive to light. No scleral icterus.  Neck: Normal range of motion. Neck supple. No thyromegaly present.  Cardiovascular: Normal rate and regular rhythm.  Exam reveals no gallop and no friction rub.   No murmur heard. Pulmonary/Chest: Effort normal and breath sounds normal. No respiratory distress. She has no wheezes. She has no rales.  Abdominal: Soft. Bowel sounds are normal. She exhibits no distension. There is no tenderness. There is no rebound.  Musculoskeletal: Normal range of motion.       Legs: Nontender the abdomen. Normal neurological exam the bilateral extremities. Her appendix is surgically absent by history.  Neurological: She is alert and oriented to person, place, and time.  Skin: Skin is warm and dry. No rash noted.  Psychiatric: She has a normal mood and affect. Her behavior is normal.    ED Course  Procedures (including critical care time) Labs Review Labs Reviewed  URINALYSIS, ROUTINE W REFLEX MICROSCOPIC - Abnormal; Notable for  the following:    Hgb urine dipstick TRACE (*)    All other components within normal limits  CBC WITH DIFFERENTIAL - Abnormal; Notable for the following:    RBC 5.49 (*)    Eosinophils Relative 6 (*)    All other components within normal limits  URINE MICROSCOPIC-ADD ON    Imaging Review Dg Hip Complete Right  02/11/2014   CLINICAL DATA:  Right hip pain.  EXAM: RIGHT HIP - COMPLETE 2+ VIEW  COMPARISON:  MRI 06/06/2007  FINDINGS: Nonspecific bowel gas pattern. The pelvic bony ring is intact. The right hip is located without a fracture.  IMPRESSION: No acute bone abnormality to the pelvis or right hip.   Electronically Signed   By: Markus Daft M.D.   On: 02/11/2014 11:25  EKG Interpretation None      MDM   Final diagnoses:  Strain of hip flexor, right, initial encounter    Symptoms localized to her right hip flexor muscles. She has pain    actively flex the right leg. Has pain with acids stretch of the right hip as well. Pain refers to the right anterior flexor crease. no pain in the midline back. No radicular findings or complaints. Think she is appropriate for outpatient treatment. Physical therapy referral. Anti-inflammatory, pain medicine, most relaxants.  Tanna Furry, MD 02/11/14 [phone]

## 2014-02-11 NOTE — Patient Instructions (Signed)
Gastroesophageal Reflux Disease, Adult Gastroesophageal reflux disease (GERD) happens when acid from your stomach flows up into the esophagus. When acid comes in contact with the esophagus, the acid causes soreness (inflammation) in the esophagus. Over time, GERD may create small holes (ulcers) in the lining of the esophagus. CAUSES   Increased body weight. This puts pressure on the stomach, making acid rise from the stomach into the esophagus.  Smoking. This increases acid production in the stomach.  Drinking alcohol. This causes decreased pressure in the lower esophageal sphincter (valve or ring of muscle between the esophagus and stomach), allowing acid from the stomach into the esophagus.  Late evening meals and a full stomach. This increases pressure and acid production in the stomach.  A malformed lower esophageal sphincter. Sometimes, no cause is found. SYMPTOMS   Burning pain in the lower part of the mid-chest behind the breastbone and in the mid-stomach area. This may occur twice a week or more often.  Trouble swallowing.  Sore throat.  Dry cough.  Asthma-like symptoms including chest tightness, shortness of breath, or wheezing. DIAGNOSIS  Your caregiver may be able to diagnose GERD based on your symptoms. In some cases, X-rays and other tests may be done to check for complications or to check the condition of your stomach and esophagus. TREATMENT  Your caregiver may recommend over-the-counter or prescription medicines to help decrease acid production. Ask your caregiver before starting or adding any new medicines.  HOME CARE INSTRUCTIONS   Change the factors that you can control. Ask your caregiver for guidance concerning weight loss, quitting smoking, and alcohol consumption.  Avoid foods and drinks that make your symptoms worse, such as:  Caffeine or alcoholic drinks.  Chocolate.  Peppermint or mint flavorings.  Garlic and onions.  Spicy foods.  Citrus fruits,  such as oranges, lemons, or limes.  Tomato-based foods such as sauce, chili, salsa, and pizza.  Fried and fatty foods.  Avoid lying down for the 3 hours prior to your bedtime or prior to taking a nap.  Eat small, frequent meals instead of large meals.  Wear loose-fitting clothing. Do not wear anything tight around your waist that causes pressure on your stomach.  Raise the head of your bed 6 to 8 inches with wood blocks to help you sleep. Extra pillows will not help.  Only take over-the-counter or prescription medicines for pain, discomfort, or fever as directed by your caregiver.  Do not take aspirin, ibuprofen, or other nonsteroidal anti-inflammatory drugs (NSAIDs). SEEK IMMEDIATE MEDICAL CARE IF:   You have pain in your arms, neck, jaw, teeth, or back.  Your pain increases or changes in intensity or duration.  You develop nausea, vomiting, or sweating (diaphoresis).  You develop shortness of breath, or you faint.  Your vomit is green, yellow, black, or looks like coffee grounds or blood.  Your stool is red, bloody, or black. These symptoms could be signs of other problems, such as heart disease, gastric bleeding, or esophageal bleeding. MAKE SURE YOU:   Understand these instructions.  Will watch your condition.  Will get help right away if you are not doing well or get worse. Document Released: 03/23/2005 Document Revised: 09/05/2011 Document Reviewed: 12/31/2010 Exit[employer] Patient Information 2015 [employer]. This information is not intended to replace advice given to you by your health care provider. Make sure you discuss any questions you have with your health care provider.  

## 2014-02-11 NOTE — Assessment & Plan Note (Addendum)
BP improved a lot from last visit. Continue current regimen. Lipid profile ordered today.

## 2014-02-11 NOTE — Assessment & Plan Note (Signed)
Continue synthroid at current dose. TSH rechecked today.

## 2014-02-11 NOTE — Assessment & Plan Note (Addendum)
Neck exam benign. ?? Arthritic changes. Xray neck ordered. Patient is aware.

## 2014-02-11 NOTE — Assessment & Plan Note (Signed)
Likely muscle sprain. Fracture or dislocation unlikely since no hx of fall. Xray ordered due to severity of pain which was normal. Continue home pain regimen. Consider PT referral if no improvement.

## 2014-02-11 NOTE — Assessment & Plan Note (Signed)
With minimal improvement. Xray done few months ago showed healed fracture. Consider orthopedic referral if persistent but she only has pink card/insurance. I will reassess at next visit.

## 2014-02-12 ENCOUNTER — Telehealth: Payer: Self-pay | Admitting: Family Medicine

## 2014-02-12 NOTE — Telephone Encounter (Signed)
Lab result discussed with patient this morning.

## 2014-02-20 ENCOUNTER — Telehealth: Payer: Self-pay | Admitting: Family Medicine

## 2014-02-20 NOTE — Telephone Encounter (Signed)
Pt said that Dr. Gwendlyn Deutscher referred her to [facility], but they will not see her because she has the orange card. The only Gastro office will see patients with the orange card is in [facility], but the patient has to bee seen by Ms. Pamala Hurry first to fill out a different application ([place]) through [facility] and if approved the doctor can do a referral to [facility] and then the patient can be seen. If not the patient will have to pay out of pocket. jw

## 2014-02-20 NOTE — Telephone Encounter (Signed)
Please have patient schedule appointment with Pamala Hurry to help with [facility] GI referral. Thanks.

## 2014-03-12 ENCOUNTER — Other Ambulatory Visit: Payer: Self-pay | Admitting: *Deleted

## 2014-03-12 ENCOUNTER — Other Ambulatory Visit: Payer: Self-pay | Admitting: Family Medicine

## 2014-03-12 DIAGNOSIS — I10 Essential (primary) hypertension: Secondary | ICD-10-CM

## 2014-03-12 NOTE — Telephone Encounter (Signed)
Please schedule lab work appointment for patient since she is on Aldactone.

## 2014-03-12 NOTE — Telephone Encounter (Signed)
Patient stated she has not been taking Aldactone and her BP is fine, she was given limited prescription and no refill. She thought it was just for a few period of time so she never checked back for refill. I recommended f/u with me soon to assess BP and adjust medication as needed. I am not so comfortable with her on Aldactone and Lisinopril due to risk for hyperkalemia. She verbalized understanding.   [facility], please schedule f/u appointment for patient with me.

## 2014-03-12 NOTE — Telephone Encounter (Signed)
LM for patient to call back and schedule an appt with her PCP. Jazmin Hartsell,CMA

## 2014-03-17 ENCOUNTER — Other Ambulatory Visit: Payer: Self-pay | Admitting: *Deleted

## 2014-03-17 DIAGNOSIS — I10 Essential (primary) hypertension: Secondary | ICD-10-CM

## 2014-03-17 MED ORDER — LISINOPRIL-HYDROCHLOROTHIAZIDE 20-25 MG PO TABS: 1.0000 | ORAL_TABLET | Freq: Every day | ORAL | Status: DC

## 2014-03-18 ENCOUNTER — Other Ambulatory Visit: Payer: Self-pay

## 2014-03-18 NOTE — Telephone Encounter (Signed)
Left voice message for pt to call for an appt for med refill.  Derl Barrow, RN

## 2014-03-18 NOTE — Telephone Encounter (Signed)
Please have patient follow up with me for refill of spironolactone.

## 2014-03-18 NOTE — Telephone Encounter (Signed)
I will like patient to be seen prior to refilling Aldactone, please schedule follow up with me or any provider in 1-2 wks. Need lab work  And BP check.

## 2014-03-20 ENCOUNTER — Other Ambulatory Visit: Payer: Self-pay

## 2014-03-20 DIAGNOSIS — I10 Essential (primary) hypertension: Secondary | ICD-10-CM

## 2014-03-20 LAB — BASIC METABOLIC PANEL
BUN: 11 mg/dL (ref 6–23)
CO2: 28 mEq/L (ref 19–32)
Calcium: 10 mg/dL (ref 8.4–10.5)
Chloride: 102 mEq/L (ref 96–112)
Creat: 1 mg/dL (ref 0.50–1.10)
Glucose, Bld: 92 mg/dL (ref 70–99)
Potassium: 3.5 mEq/L (ref 3.5–5.3)
Sodium: 140 mEq/L (ref 135–145)

## 2014-03-20 NOTE — Progress Notes (Signed)
BMP DONE TODAY Teresa [name] 

## 2014-03-21 ENCOUNTER — Encounter: Payer: Self-pay | Admitting: Family Medicine

## 2014-03-25 ENCOUNTER — Ambulatory Visit: Payer: Self-pay | Admitting: Family Medicine

## 2014-04-02 ENCOUNTER — Ambulatory Visit: Payer: Self-pay | Admitting: Family Medicine

## 2014-04-03 ENCOUNTER — Ambulatory Visit: Payer: Self-pay | Admitting: Family Medicine

## 2014-04-15 ENCOUNTER — Ambulatory Visit (INDEPENDENT_AMBULATORY_CARE_PROVIDER_SITE_OTHER): Payer: Self-pay | Admitting: Family Medicine

## 2014-04-15 ENCOUNTER — Encounter: Payer: Self-pay | Admitting: Family Medicine

## 2014-04-15 ENCOUNTER — Ambulatory Visit (HOSPITAL_COMMUNITY)
Admission: RE | Admit: 2014-04-15 | Discharge: 2014-04-15 | Disposition: A | Discharge status: Home / Self Care | Payer: Self-pay | Source: Ambulatory Visit | Attending: Family Medicine | Admitting: Family Medicine

## 2014-04-15 ENCOUNTER — Telehealth: Payer: Self-pay | Admitting: Family Medicine

## 2014-04-15 VITALS — BP 185/80 | HR 61 | Temp 98.2°F | Wt 187.0 lb

## 2014-04-15 DIAGNOSIS — I10 Essential (primary) hypertension: Secondary | ICD-10-CM

## 2014-04-15 DIAGNOSIS — M25521 Pain in right elbow: Secondary | ICD-10-CM | POA: Insufficient documentation

## 2014-04-15 DIAGNOSIS — Z23 Encounter for immunization: Secondary | ICD-10-CM

## 2014-04-15 DIAGNOSIS — E669 Obesity, unspecified: Secondary | ICD-10-CM

## 2014-04-15 DIAGNOSIS — Z0000 Encounter for general adult medical examination without abnormal findings: Secondary | ICD-10-CM | POA: Insufficient documentation

## 2014-04-15 DIAGNOSIS — M25511 Pain in right shoulder: Secondary | ICD-10-CM

## 2014-04-15 DIAGNOSIS — M542 Cervicalgia: Secondary | ICD-10-CM | POA: Insufficient documentation

## 2014-04-15 DIAGNOSIS — E039 Hypothyroidism, unspecified: Secondary | ICD-10-CM

## 2014-04-15 MED ORDER — SPIRONOLACTONE 50 MG PO TABS: 50.0000 mg | ORAL_TABLET | Freq: Every day | ORAL | Status: DC

## 2014-04-15 MED ORDER — HYDROCODONE-ACETAMINOPHEN 5-325 MG PO TABS
1.0000 | ORAL_TABLET | ORAL | Status: DC | PRN
Start: 2014-04-15 — End: 2014-04-26

## 2014-04-15 NOTE — Progress Notes (Signed)
Subjective:     Patient ID: Teresa [name], female   DOB: 10-Jan-1961, 53 y.o.   MRN: [id number]  HPI  HTN: here for follow up, currently on Prinzide 20/25 mg qd, Metoprolol 25 mg BID and Norvasc 10 mg qd, She was started on Aldactone a while ago but had not had any refill for weeks now. She denies any HA, no change in vision, no N/V, no limb weakness. Arm pain: C/O right arm for 1.5 months,she woke up with the pain, this is gradually worsening. Pain is throbbing in nature,starts in elbow and radiates to wrist and shoulder. Pain now is about 5/10 in severity. Positive stiffness in the right shoulder. 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 She had trauma to her right elbow  when she was younger when she fell on a hard rock landing onher right elbow. Neck pain: Still having neck pain for which she was seen few weeks ago, she was meant to get xray of the neck but missed her appointment, pain has been on going for 1 yr, she will like to get neck xray now. Hypothyroidism:Compliant with her synthroid, here for follow up. Weight issue: Tries to change diet. Does not often drink soda, eats a lot of fruit and vegetable. HM: She need Mammogram and flu.  Current Outpatient Prescriptions on File Prior to Visit  Medication Sig Dispense Refill  . amLODipine (NORVASC) 10 MG tablet Take 1 tablet (10 mg total) by mouth at bedtime.  30 tablet  4  . levothyroxine (SYNTHROID, LEVOTHROID) 100 MCG tablet TAKE ONE TABLET BY MOUTH IN THE MORNING  90 tablet  1  . lisinopril-hydrochlorothiazide (PRINZIDE,ZESTORETIC) 20-25 MG per tablet Take 1 tablet by mouth daily.  90 tablet  1  . loratadine (CLARITIN) 10 MG tablet Take 10 mg by mouth daily.      . metoprolol tartrate (LOPRESSOR) 25 MG tablet TAKE ONE-HALF TABLET BY MOUTH TWICE DAILY  90 tablet  1  . Multiple Vitamin (MULTIVITAMIN WITH MINERALS) TABS tablet Take 1 tablet by mouth daily.      . methocarbamol (ROBAXIN) 500 MG tablet Take 1  tablet (500 mg total) by mouth 2 (two) times daily.  20 tablet  0  . naproxen (NAPROSYN) 500 MG tablet Take 1 tablet (500 mg total) by mouth 2 (two) times daily.  30 tablet  0  . pantoprazole (PROTONIX) 40 MG tablet Take 1 tablet (40 mg total) by mouth daily.  30 tablet  3   No current facility-administered medications on file prior to visit.   Past Medical History  Diagnosis Date  . Hypertension   . Anemia   . Neck strain 02/06/2012  . Graves' disease   . Positive TB test   . H/O hiatal hernia   . Migraine headache     "couple times/month" (02/12/2013)  . Chronic lower back pain       Review of Systems  Respiratory: Negative.   Cardiovascular: Negative.   Gastrointestinal: Negative.   Genitourinary: Negative.   Musculoskeletal: Positive for arthralgias, myalgias and neck pain. Negative for back pain.  Neurological: Negative for dizziness, seizures, weakness and headaches.  All other systems reviewed and are negative.      Filed Vitals:   04/15/14 0855 04/15/14 0917  BP: 183/83 185/80  Pulse: 61   Temp: 98.2 F (36.8 C)   TempSrc: Oral   Weight: 187 lb (84.823 kg)     Objective:   Physical Exam  Nursing note and vitals reviewed. Constitutional: She is oriented to person, place, and time. She appears well-developed. No distress.  Cardiovascular: Normal rate, regular rhythm, normal heart sounds and intact distal pulses.   No murmur heard. Pulmonary/Chest: Effort normal and breath sounds normal. No respiratory distress. She has no wheezes.  Abdominal: Soft. Bowel sounds are normal. She exhibits no distension and no mass. There is no tenderness.  Musculoskeletal: She exhibits no edema.       Right shoulder: She exhibits decreased range of motion and tenderness. She exhibits no swelling, no crepitus and no deformity.       Right elbow: She exhibits decreased range of motion. She exhibits no swelling and no deformity. Tenderness found.       Cervical back: She exhibits  tenderness. She exhibits normal range of motion, no bony tenderness, no swelling and no deformity.  Neurological: She is alert and oriented to person, place, and time. She has normal strength. No cranial nerve deficit or sensory deficit.  Psychiatric: She has a normal mood and affect.       Assessment:     HTN Right elbow pain Right shoulder pain Neck pain Hypothyroidism Weight gain Health maintenance    Plan:     Check problem list.

## 2014-04-15 NOTE — Assessment & Plan Note (Signed)
BP elevated. I am trying to avoid Aldactone since she is already on Lisinopril which can cause hyperkalemia. I recommended adding hydralazine to regimen but she declined. Plan to give her treatment while in the clinic with systolic of 830 but we only have Clonidine which she is allergic to. Patient is currently asymptomatic. I refilled her Aldactone and instructed she pick it up from pharmacy and use right away. Return precaution given. Continue home BP monitoring. RTC in 2 wks for BP check and K+ check.

## 2014-04-15 NOTE — Assessment & Plan Note (Signed)
??   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 Xray of her elbow showed mild DJD with possible loose body. This could be bone fragment from previous elbow injury many years ago. I refilled her pain medicine for few more days. PT recommended but cannot afford it hence home exercise recommended. If no improvement may need more imaging ( MRI).

## 2014-04-15 NOTE — Assessment & Plan Note (Signed)
??   DJD. Xray done today was normal. Plan home exercise. I refilled her Oxycodone for few more days. Home exercise instruction given.

## 2014-04-15 NOTE — Telephone Encounter (Signed)
Xray result discussed with patient, cervical, shoulder and elbow of the right side. All normal except for left elbow with possible DJD and loose body which could be from trauma she had many years ago. Plan to continue pain medicine and home exercise as instructed. If no improvement I will obtain MRI to further assess her elbow joint. She verbalized understanding and agreed with plan.

## 2014-04-15 NOTE — Assessment & Plan Note (Signed)
Stable on current regimen. Last TSH done few weeks ago was normal.

## 2014-04-15 NOTE — Assessment & Plan Note (Signed)
Etiology unclear. Xray done today was normal. ?? Muscle/ ligament sprain. Home stretching exercise and pain medicine as needed recommended.

## 2014-04-15 NOTE — Assessment & Plan Note (Signed)
Body mass index is 31.12 kg/(m^2). Diet and exercise counseling done. She will work on this for the next few weeks.

## 2014-04-15 NOTE — Assessment & Plan Note (Signed)
Flu shot recommended and will be given today. I gave her mammogram scheduling instruction card today. She will call to schedule her own mammogram.

## 2014-04-15 NOTE — Patient Instructions (Addendum)
It was nice seeing you toay, BP is quite elevated. I have refilled your spironolactone, please go get it right away. Continue monitoring your BP at home, if persistently elevated despite medication or if you are having symptoms, please call or go to the ED. I will like to see you back in 2 wk.   Shoulder Range of Motion Exercises The shoulder is the most flexible joint in the human body. Because of this it is also the most unstable joint in the body. All ages can develop shoulder problems. Early treatment of problems is necessary for a good outcome. People react to shoulder pain by decreasing the movement of the joint. After a brief period of time, the shoulder can become "frozen". This is an almost complete loss of the ability to move the damaged shoulder. Following injuries your caregivers can give you instructions on exercises to keep your range of motion (ability to move your shoulder freely), or regain it if it has been lost.  Brooks: Codman's Exercise or Pendulum Exercise  This exercise may be performed in a prone (face-down) lying position or standing while leaning on a chair with the opposite arm. Its purpose is to relax the muscles in your shoulder and slowly but surely increase the range of motion and to relieve pain.  Lie on your stomach close to the side edge of the bed. Let your weak arm hang over the edge of the bed. Relax your shoulder, arm and hand. Let your shoulder blade relax and drop down.  Slowly and gently swing your arm forward and back. Do not use your neck muscles; relax them. It might be easier to have someone else gently start swinging your arm.  As pain decreases, increase your swing. To start, arm swing should begin at 15 degree angles. In time and as pain lessens, move to 30-45 degree angles. Start with swinging for about 15 seconds, and work towards swinging for 3 to 5 minutes.  This exercise may also be performed  in a standing/bent over position.  Stand and hold onto a sturdy chair with your good arm. Bend forward at the waist and bend your knees slightly to help protect your back. Relax your weak arm, let it hang limp. Relax your shoulder blade and let it drop.  Keep your shoulder relaxed and use body motion to swing your arm in small circles.  Stand up tall and relax.  Repeat motion and change direction of circles.  Start with swinging for about 30 seconds, and work towards swinging for 3 to 5 minutes. STRETCHING EXERCISES:  Lift your arm out in front of you with the elbow bent at 90 degrees. Using your other arm gently pull the elbow forward and across your body.  Bend one arm behind you with the palm facing outward. Using the other arm, hold a towel or rope and reach this arm up above your head, then bend it at the elbow to move your wrist to behind your neck. Grab the free end of the towel with the hand behind your back. Gently pull the towel up with the hand behind your neck, gradually increasing the pull on the hand behind the small of your back. Then, gradually pull down with the hand behind the small of your back. This will pull the hand and arm behind your neck further. Both shoulders will have an increased range of motion with repetition of this exercise. STRENGTHENING EXERCISES:  Standing with your  arm at your side and straight out from your shoulder with the elbow bent at 90 degrees, hold onto a small weight and slowly raise your hand so it points straight up in the air. Repeat this five times to begin with, and gradually increase to ten times. Do this four times per day. As you grow stronger you can gradually increase the weight.  Repeat the above exercise, only this time using an elastic band. Start with your hand up in the air and pull down until your hand is by your side. As you grow stronger, gradually increase the amount you pull by increasing the number or size of the elastic bands.  Use the same amount of repetitions.  Standing with your hand at your side and holding onto a weight, gradually lift the hand in front of you until it is over your head. Do the same also with the hand remaining at your side and lift the hand away from your body until it is again over your head. Repeat this five times to begin with, and gradually increase to ten times. Do this four times per day. As you grow stronger you can gradually increase the weight. Document Released: 03/12/2003 Document Revised: 06/18/2013 Document Reviewed: 06/13/2005 [facility] Patient Information 2015 [place], [place]. This information is not intended to replace advice given to you by your health care provider. Make sure you discuss any questions you have with your health care provider.

## 2014-04-16 ENCOUNTER — Other Ambulatory Visit: Payer: Self-pay | Admitting: Family Medicine

## 2014-04-16 DIAGNOSIS — Z1231 Encounter for screening mammogram for malignant neoplasm of breast: Secondary | ICD-10-CM

## 2014-04-23 ENCOUNTER — Ambulatory Visit (HOSPITAL_COMMUNITY)
Admission: RE | Admit: 2014-04-23 | Discharge: 2014-04-23 | Disposition: A | Discharge status: Home / Self Care | Payer: Self-pay | Source: Ambulatory Visit | Attending: Family Medicine | Admitting: Family Medicine

## 2014-04-23 DIAGNOSIS — Z1231 Encounter for screening mammogram for malignant neoplasm of breast: Secondary | ICD-10-CM | POA: Insufficient documentation

## 2014-04-24 ENCOUNTER — Other Ambulatory Visit: Payer: Self-pay | Admitting: Family Medicine

## 2014-04-24 DIAGNOSIS — R928 Other abnormal and inconclusive findings on diagnostic imaging of breast: Secondary | ICD-10-CM

## 2014-04-25 ENCOUNTER — Telehealth: Payer: Self-pay | Admitting: Family Medicine

## 2014-04-25 ENCOUNTER — Encounter: Payer: Self-pay | Admitting: Family Medicine

## 2014-04-25 NOTE — Telephone Encounter (Signed)
Pt made aware of appt on 05/08/2014 @ 8:15am.  Appt reminder mailed. Jazmin Hartsell,CMA

## 2014-04-25 NOTE — Telephone Encounter (Signed)
Patient returned call, I discussed her mammogram report with her. I advised she will need to get diagnostic mammogram. Our clinical staff informed to schedule this.

## 2014-04-25 NOTE — Telephone Encounter (Signed)
Message left for her to call back about her mammogram report.

## 2014-04-26 ENCOUNTER — Emergency Department (HOSPITAL_COMMUNITY)
Admission: EM | Admit: 2014-04-26 | Discharge: 2014-04-26 | Disposition: A | Discharge status: Home / Self Care | Payer: Self-pay | Attending: Emergency Medicine | Admitting: Emergency Medicine

## 2014-04-26 ENCOUNTER — Encounter (HOSPITAL_COMMUNITY): Payer: Self-pay | Admitting: Emergency Medicine

## 2014-04-26 DIAGNOSIS — Z791 Long term (current) use of non-steroidal anti-inflammatories (NSAID): Secondary | ICD-10-CM | POA: Insufficient documentation

## 2014-04-26 DIAGNOSIS — Z8639 Personal history of other endocrine, nutritional and metabolic disease: Secondary | ICD-10-CM | POA: Insufficient documentation

## 2014-04-26 DIAGNOSIS — I10 Essential (primary) hypertension: Secondary | ICD-10-CM | POA: Insufficient documentation

## 2014-04-26 DIAGNOSIS — G8929 Other chronic pain: Secondary | ICD-10-CM | POA: Insufficient documentation

## 2014-04-26 DIAGNOSIS — Z91040 Latex allergy status: Secondary | ICD-10-CM | POA: Insufficient documentation

## 2014-04-26 DIAGNOSIS — G43909 Migraine, unspecified, not intractable, without status migrainosus: Secondary | ICD-10-CM | POA: Insufficient documentation

## 2014-04-26 DIAGNOSIS — L03114 Cellulitis of left upper limb: Secondary | ICD-10-CM | POA: Insufficient documentation

## 2014-04-26 DIAGNOSIS — Z862 Personal history of diseases of the blood and blood-forming organs and certain disorders involving the immune mechanism: Secondary | ICD-10-CM | POA: Insufficient documentation

## 2014-04-26 DIAGNOSIS — Z720 Tobacco use: Secondary | ICD-10-CM | POA: Insufficient documentation

## 2014-04-26 DIAGNOSIS — Z79899 Other long term (current) drug therapy: Secondary | ICD-10-CM | POA: Insufficient documentation

## 2014-04-26 DIAGNOSIS — Z8611 Personal history of tuberculosis: Secondary | ICD-10-CM | POA: Insufficient documentation

## 2014-04-26 MED ORDER — SULFAMETHOXAZOLE-TRIMETHOPRIM 800-160 MG PO TABS: 2.0000 | ORAL_TABLET | Freq: Two times a day (BID) | ORAL | Status: DC

## 2014-04-26 MED ORDER — CEPHALEXIN 500 MG PO CAPS: 500.0000 mg | ORAL_CAPSULE | Freq: Four times a day (QID) | ORAL | Status: DC

## 2014-04-26 NOTE — ED Provider Notes (Signed)
Medical screening examination/treatment/procedure(s) were conducted as a shared visit with non-physician practitioner(s) or resident and myself. I personally evaluated the patient during the encounter and agree with the findings.  I have personally reviewed any xrays and/ or EKG's with the provider and I agree with interpretation.  Patient with history of insect bite presents with left arm warmth redness and tenderness. The erythema has spread since Thursday. On exam patient has warmth, erythema and tenderness the proximal left posterior arm. Patient denies fevers or vomiting. Patient doesn't have any immunosuppression history. Bedside ultrasound no significant fluid collection. Plan for cellulitis treatment oral antibiotics  EMERGENCY DEPARTMENT US SOFT TISSUE INTERPRETATION  "Study: Limited Soft Tissue Ultrasound"  INDICATIONS: Pain and Soft tissue infection  Multiple views of the body part were obtained in real-time with a multi-frequency linear probe  PERFORMED BY: Myself  IMAGES ARCHIVED?: Yes  SIDE:Left  BODY PART:Upper extremity  FINDINGS: No abcess noted and Cellulitis present  INTERPRETATION: No abcess noted and Cellulitis present  Left arm cellulitis   Mariea Clonts, MD 04/26/14 [phone]

## 2014-04-26 NOTE — ED Notes (Addendum)
Thursday evening while at choir practice, patient first noticed itching on left upper arm.  It progressively worsened until yesterday she noticed a reddened area the size of a pea.  This morning she woke up and  there was a slightly raised area 3 inches by 5 inches of erythema, redness, and induration.  Area is warm to touch.  Marked.  Patient was admitted for insect/spider bite last year

## 2014-04-26 NOTE — ED Provider Notes (Signed)
CSN: [id number]     Arrival date & time 04/26/14  1004 History   First MD Initiated Contact with Patient 04/26/14 1026     Chief Complaint  Patient presents with  . Insect Bite     (Consider location/radiation/quality/duration/timing/severity/associated sxs/prior Treatment) HPI Comments: Patient presents today with what she thinks is an insect bite to the left posterior upper arm.  She reports that she noticed a pea sized area two days ago.  She thinks it is an insect bite, but did not actually see an insect or feel an insect bite her.  This morning she states that she noticed increasing redness around the area and the area was tender to the touch.  She denies fever, chills, nausea, or vomiting.  No history of DM or Immunosuppresion.  She reports that she has applied Hydrocortisone to the area without improvement.  She denies swelling of the lips, tongue, throat, or SOB.  She reports that she was admitted for similar presentation 2 years ago.    The history is provided by the patient.    Past Medical History  Diagnosis Date  . Hypertension   . Anemia   . Neck strain 02/06/2012  . Graves' disease   . Positive TB test   . H/O hiatal hernia   . Migraine headache     "couple times/month" (02/12/2013)  . Chronic lower back pain    Past Surgical History  Procedure Laterality Date  . Cesarean section  1979 1986 1990  . Appendectomy  2006  . Abdominal hysterectomy  2006   Family History  Problem Relation Age of Onset  . Heart disease Other   . Diabetes Other   . Heart disease Mother     CAB age 62  . Diabetes Mother   . Cancer Father 27    colon cancer  . Stroke Father   . Diabetes Sister   . Kidney disease Sister     renal failure  . Diabetes Brother   . Kidney disease Brother     renal failure   History  Substance Use Topics  . Smoking status: Current Every Day Smoker -- 0.25 packs/day for 30 years    Types: Cigarettes  . Smokeless tobacco: Never Used  . Alcohol Use:  No   OB History   Grav Para Term Preterm Abortions TAB SAB Ect Mult Living                 Review of Systems  All other systems reviewed and are negative.     Allergies  Clonidine derivatives; Aspirin; and Latex  Home Medications   Prior to Admission medications   Medication Sig Start Date End Date Taking? Authorizing Provider  amLODipine (NORVASC) 10 MG tablet Take 1 tablet (10 mg total) by mouth at bedtime. 01/28/14 01/28/15  [name], MD  HYDROcodone-acetaminophen (NORCO/VICODIN) 5-325 MG per tablet Take 1 tablet by mouth every 4 (four) hours as needed. 04/15/14   [name], MD  levothyroxine (SYNTHROID, LEVOTHROID) 100 MCG tablet TAKE ONE TABLET BY MOUTH IN THE MORNING 03/12/14   [name], MD  lisinopril-hydrochlorothiazide (PRINZIDE,ZESTORETIC) 20-25 MG per tablet Take 1 tablet by mouth daily. 03/17/14   [name], MD  loratadine (CLARITIN) 10 MG tablet Take 10 mg by mouth daily.    Historical Provider, MD  methocarbamol (ROBAXIN) 500 MG tablet Take 1 tablet (500 mg total) by mouth 2 (two) times daily. 02/11/14   Tanna Furry, MD  metoprolol tartrate (LOPRESSOR) 25 MG tablet TAKE  ONE-HALF TABLET BY MOUTH TWICE DAILY 03/12/14   [name], MD  Multiple Vitamin (MULTIVITAMIN WITH MINERALS) TABS tablet Take 1 tablet by mouth daily.    Historical Provider, MD  naproxen (NAPROSYN) 500 MG tablet Take 1 tablet (500 mg total) by mouth 2 (two) times daily. 02/11/14   Tanna Furry, MD  pantoprazole (PROTONIX) 40 MG tablet Take 1 tablet (40 mg total) by mouth daily. 02/11/14   [name], MD  spironolactone (ALDACTONE) 50 MG tablet Take 1 tablet (50 mg total) by mouth daily. 04/15/14   [name], MD   BP 170/83  Pulse 67  Temp(Src) 97.9 F (36.6 C) (Oral)  Resp 18  SpO2 98% Physical Exam  Nursing note and vitals reviewed. Constitutional: She appears well-developed and well-nourished.  HENT:  Head: Normocephalic and atraumatic.  Neck: Normal range of motion.  Neck supple.  Cardiovascular: Normal rate, regular rhythm and normal heart sounds.   Pulses:      Radial pulses are 2+ on the left side.  Pulmonary/Chest: Effort normal and breath sounds normal.  Musculoskeletal: Normal range of motion.  Lymphadenopathy:       Left axillary: No pectoral and no lateral adenopathy present. Neurological: She is alert.  Distal sensation of left hand intact  Skin: Skin is warm and dry.  Approximately 6 cm x 8 cm erythematous, indurated, warm area of the posterior left upper arm.  No erythematous streaking.  No fluctuance.  Area marked with a skin marking pen.  Psychiatric: She has a normal mood and affect.    ED Course  Procedures (including critical care time) Labs Review Labs Reviewed - No data to display  Imaging Review No results found.   EKG Interpretation None      MDM   Final diagnoses:  None   Patient with cellulitis of the LUE.  Patient is afebrile and well appearing.  She is not immunocompromised.  No nausea or vomiting.  Therefore, feel that the patient can be treated with outpatient antibiotics.  Area of Cellulitis marked with a skin marking pen.  Area evaluated with bedside Ultrasound by Dr. Reather Converse and no fluid collection visualized.  Patient instructed to follow up with PCP in 2 days for recheck.  Patient stable for discharge.  Return precautions given.      Hyman Bible, PA-C 04/26/14 1350

## 2014-04-26 NOTE — ED Notes (Signed)
Dr Zavitz at bedside  

## 2014-04-26 NOTE — ED Notes (Signed)
Pt having increased redness on left posterior arm that has been worsening redness, hot, and itching since Thurs. Wound started as pea sized and is now spreading. Burning, itching, soreness.

## 2014-05-08 ENCOUNTER — Ambulatory Visit
Admission: RE | Admit: 2014-05-08 | Discharge: 2014-05-08 | Disposition: A | Discharge status: Home / Self Care | Payer: No Typology Code available for payment source | Source: Ambulatory Visit | Attending: Family Medicine | Admitting: Family Medicine

## 2014-05-08 DIAGNOSIS — R928 Other abnormal and inconclusive findings on diagnostic imaging of breast: Secondary | ICD-10-CM

## 2014-05-14 ENCOUNTER — Encounter: Payer: Self-pay | Admitting: Family Medicine

## 2014-05-22 ENCOUNTER — Emergency Department (HOSPITAL_COMMUNITY)
Admission: EM | Admit: 2014-05-22 | Discharge: 2014-05-22 | Disposition: A | Discharge status: Home / Self Care | Payer: Self-pay | Attending: Emergency Medicine | Admitting: Emergency Medicine

## 2014-05-22 ENCOUNTER — Encounter (HOSPITAL_COMMUNITY): Payer: Self-pay | Admitting: *Deleted

## 2014-05-22 ENCOUNTER — Emergency Department (HOSPITAL_COMMUNITY): Payer: Self-pay

## 2014-05-22 DIAGNOSIS — M25562 Pain in left knee: Secondary | ICD-10-CM

## 2014-05-22 DIAGNOSIS — Z862 Personal history of diseases of the blood and blood-forming organs and certain disorders involving the immune mechanism: Secondary | ICD-10-CM | POA: Insufficient documentation

## 2014-05-22 DIAGNOSIS — Z792 Long term (current) use of antibiotics: Secondary | ICD-10-CM | POA: Insufficient documentation

## 2014-05-22 DIAGNOSIS — Z8719 Personal history of other diseases of the digestive system: Secondary | ICD-10-CM | POA: Insufficient documentation

## 2014-05-22 DIAGNOSIS — Z91040 Latex allergy status: Secondary | ICD-10-CM | POA: Insufficient documentation

## 2014-05-22 DIAGNOSIS — Z8639 Personal history of other endocrine, nutritional and metabolic disease: Secondary | ICD-10-CM | POA: Insufficient documentation

## 2014-05-22 DIAGNOSIS — Z720 Tobacco use: Secondary | ICD-10-CM | POA: Insufficient documentation

## 2014-05-22 DIAGNOSIS — Z79899 Other long term (current) drug therapy: Secondary | ICD-10-CM | POA: Insufficient documentation

## 2014-05-22 DIAGNOSIS — I10 Essential (primary) hypertension: Secondary | ICD-10-CM | POA: Insufficient documentation

## 2014-05-22 DIAGNOSIS — G8929 Other chronic pain: Secondary | ICD-10-CM | POA: Insufficient documentation

## 2014-05-22 DIAGNOSIS — Z8611 Personal history of tuberculosis: Secondary | ICD-10-CM | POA: Insufficient documentation

## 2014-05-22 DIAGNOSIS — R0602 Shortness of breath: Secondary | ICD-10-CM

## 2014-05-22 LAB — BASIC METABOLIC PANEL
Anion gap: 15 (ref 5–15)
BUN: 17 mg/dL (ref 6–23)
CO2: 21 mEq/L (ref 19–32)
Calcium: 9.9 mg/dL (ref 8.4–10.5)
Chloride: 101 mEq/L (ref 96–112)
Creatinine, Ser: 1.05 mg/dL (ref 0.50–1.10)
GFR calc Af Amer: 70 mL/min — ABNORMAL LOW (ref >90)
GFR calc non Af Amer: 60 mL/min — ABNORMAL LOW (ref >90)
Glucose, Bld: 91 mg/dL (ref 70–99)
Potassium: 3.6 mEq/L — ABNORMAL LOW (ref 3.7–5.3)
Sodium: 137 mEq/L (ref 137–147)

## 2014-05-22 LAB — D-DIMER, QUANTITATIVE (NOT AT ARMC): D-Dimer, Quant: 0.28 ug/mL-FEU (ref 0.00–0.48)

## 2014-05-22 LAB — CBC WITH DIFFERENTIAL/PLATELET
Basophils Absolute: 0.1 10*3/uL (ref 0.0–0.1)
Basophils Relative: 1 % (ref 0–1)
Eosinophils Absolute: 0.3 10*3/uL (ref 0.0–0.7)
Eosinophils Relative: 4 % (ref 0–5)
HCT: 39.5 % (ref 36.0–46.0)
Hemoglobin: 13.3 g/dL (ref 12.0–15.0)
Lymphocytes Relative: 34 % (ref 12–46)
Lymphs Abs: 2.9 10*3/uL (ref 0.7–4.0)
MCH: 26.7 pg (ref 26.0–34.0)
MCHC: 33.7 g/dL (ref 30.0–36.0)
MCV: 79.2 fL (ref 78.0–100.0)
Monocytes Absolute: 0.4 10*3/uL (ref 0.1–1.0)
Monocytes Relative: 5 % (ref 3–12)
Neutro Abs: 4.8 10*3/uL (ref 1.7–7.7)
Neutrophils Relative %: 56 % (ref 43–77)
Platelets: 323 10*3/uL (ref 150–400)
RBC: 4.99 MIL/uL (ref 3.87–5.11)
RDW: 13.6 % (ref 11.5–15.5)
WBC: 8.5 10*3/uL (ref 4.0–10.5)

## 2014-05-22 LAB — I-STAT TROPONIN, ED
Troponin i, poc: 0 ng/mL (ref 0.00–0.08)
Troponin i, poc: 0.01 ng/mL (ref 0.00–0.08)

## 2014-05-22 MED ORDER — OXYCODONE-ACETAMINOPHEN 5-325 MG PO TABS: 2.0000 | ORAL_TABLET | Freq: Four times a day (QID) | ORAL | Status: DC | PRN

## 2014-05-22 MED ORDER — ACETAMINOPHEN 500 MG PO TABS
1000.0000 mg | ORAL_TABLET | Freq: Once | ORAL | Status: AC
  Administered 2014-05-22: 1000 mg via ORAL
  Filled 2014-05-22: qty 2

## 2014-05-22 MED ORDER — OXYCODONE-ACETAMINOPHEN 5-325 MG PO TABS
2.0000 | ORAL_TABLET | Freq: Once | ORAL | Status: AC
  Administered 2014-05-22: 2 via ORAL
  Filled 2014-05-22: qty 2

## 2014-05-22 NOTE — ED Provider Notes (Signed)
CSN: [id number]     Arrival date & time 05/22/14  1906 History   First MD Initiated Contact with Patient 05/22/14 1916     Chief Complaint  Patient presents with  . Knee Pain     (Consider location/radiation/quality/duration/timing/severity/associated sxs/prior Treatment) HPI Comments: Patient presents to the emergency department with chief complaint of left leg pain. She states that she has had pain in her left calf for the past 2 weeks. She denies any injury. Denies any recent travel or surgery. She states that the pain is worsened with palpation. She reports mild-to-moderate associated swelling. She denies any redness or infection. She has not taken anything to alleviate her symptoms.  Additionally, patient states that she has had some increasingly frequent shortness of breath. She denies any associated chest pain.  There are no aggravating or alleviating factors.  The history is provided by the patient. No language interpreter was used.    Past Medical History  Diagnosis Date  . Hypertension   . Anemia   . Neck strain 02/06/2012  . Graves' disease   . Positive TB test   . H/O hiatal hernia   . Migraine headache     "couple times/month" (02/12/2013)  . Chronic lower back pain    Past Surgical History  Procedure Laterality Date  . Cesarean section  1979 1986 1990  . Appendectomy  2006  . Abdominal hysterectomy  2006   Family History  Problem Relation Age of Onset  . Heart disease Other   . Diabetes Other   . Heart disease Mother     CAB age 48  . Diabetes Mother   . Cancer Father 67    colon cancer  . Stroke Father   . Diabetes Sister   . Kidney disease Sister     renal failure  . Diabetes Brother   . Kidney disease Brother     renal failure   History  Substance Use Topics  . Smoking status: Current Every Day Smoker -- 0.25 packs/day for 30 years    Types: Cigarettes  . Smokeless tobacco: Never Used  . Alcohol Use: No   OB History    No data available      Review of Systems  Constitutional: Negative for fever and chills.  Respiratory: Positive for shortness of breath.   Cardiovascular: Positive for leg swelling. Negative for chest pain.  Gastrointestinal: Negative for nausea, vomiting, diarrhea and constipation.  Genitourinary: Negative for dysuria.  Musculoskeletal: Positive for myalgias and arthralgias.  All other systems reviewed and are negative.     Allergies  Clonidine derivatives; Aspirin; and Latex  Home Medications   Prior to Admission medications   Medication Sig Start Date End Date Taking? Authorizing Provider  amLODipine (NORVASC) 10 MG tablet Take 1 tablet (10 mg total) by mouth at bedtime. 01/28/14 01/28/15  [name], MD  cephALEXin (KEFLEX) 500 MG capsule Take 1 capsule (500 mg total) by mouth 4 (four) times daily. 04/26/14   Heather Laisure, PA-C  Hypromellose (ARTIFICIAL TEARS OP) Place 1 drop into both eyes as needed (for dry eyes).    Historical Provider, MD  levothyroxine (SYNTHROID, LEVOTHROID) 100 MCG tablet Take 100 mcg by mouth daily before breakfast.    Historical Provider, MD  lisinopril-hydrochlorothiazide (PRINZIDE,ZESTORETIC) 20-25 MG per tablet Take 1 tablet by mouth daily. 03/17/14   [name], MD  loratadine (CLARITIN) 10 MG tablet Take 10 mg by mouth daily.    Historical Provider, MD  metoprolol tartrate (LOPRESSOR) 25 MG tablet  Take 12.5 mg by mouth 2 (two) times daily.    Historical Provider, MD  Multiple Vitamin (MULTIVITAMIN WITH MINERALS) TABS tablet Take 1 tablet by mouth daily.    Historical Provider, MD  spironolactone (ALDACTONE) 50 MG tablet Take 1 tablet (50 mg total) by mouth daily. 04/15/14   [name], MD  sulfamethoxazole-trimethoprim (SEPTRA DS) 800-160 MG per tablet Take 2 tablets by mouth 2 (two) times daily. 04/26/14   Heather Laisure, PA-C   BP 136/72 mmHg  Pulse 70  Temp(Src) 98.1 F (36.7 C) (Oral)  Resp 16  SpO2 96% Physical Exam  Constitutional: She is oriented  to person, place, and time. She appears well-developed and well-nourished.  HENT:  Head: Normocephalic and atraumatic.  Eyes: Conjunctivae and EOM are normal. Pupils are equal, round, and reactive to light.  Neck: Normal range of motion. Neck supple.  Cardiovascular: Normal rate and regular rhythm.  Exam reveals no gallop and no friction rub.   No murmur heard. Intact distal pulses with brisk capillary refill  Pulmonary/Chest: Effort normal and breath sounds normal. No respiratory distress. She has no wheezes. She has no rales. She exhibits no tenderness.  Clear to auscultation  Abdominal: Soft. Bowel sounds are normal. She exhibits no distension and no mass. There is no tenderness. There is no rebound and no guarding.  Musculoskeletal: Normal range of motion. She exhibits no edema or tenderness.  Very mild swelling to the left calf, with tenderness to the posterior calf, range of motion and strength 5/5, no bony abnormality or deformity  Neurological: She is alert and oriented to person, place, and time.  Sensation intact throughout  Skin: Skin is warm and dry.  Psychiatric: She has a normal mood and affect. Her behavior is normal. Judgment and thought content normal.  Nursing note and vitals reviewed.   ED Course  Procedures (including critical care time) Results for orders placed or performed during the hospital encounter of 05/22/14  D-dimer, quantitative  Result Value Ref Range   D-Dimer, Quant 0.28 0.00 - 0.48 ug/mL-FEU  CBC with Differential  Result Value Ref Range   WBC 8.5 4.0 - 10.5 K/uL   RBC 4.99 3.87 - 5.11 MIL/uL   Hemoglobin 13.3 12.0 - 15.0 g/dL   HCT 39.5 36.0 - 46.0 %   MCV 79.2 78.0 - 100.0 fL   MCH 26.7 26.0 - 34.0 pg   MCHC 33.7 30.0 - 36.0 g/dL   RDW 13.6 11.5 - 15.5 %   Platelets 323 150 - 400 K/uL   Neutrophils Relative % 56 43 - 77 %   Neutro Abs 4.8 1.7 - 7.7 K/uL   Lymphocytes Relative 34 12 - 46 %   Lymphs Abs 2.9 0.7 - 4.0 K/uL   Monocytes  Relative 5 3 - 12 %   Monocytes Absolute 0.4 0.1 - 1.0 K/uL   Eosinophils Relative 4 0 - 5 %   Eosinophils Absolute 0.3 0.0 - 0.7 K/uL   Basophils Relative 1 0 - 1 %   Basophils Absolute 0.1 0.0 - 0.1 K/uL  Basic metabolic panel  Result Value Ref Range   Sodium 137 137 - 147 mEq/L   Potassium 3.6 (L) 3.7 - 5.3 mEq/L   Chloride 101 96 - 112 mEq/L   CO2 21 19 - 32 mEq/L   Glucose, Bld 91 70 - 99 mg/dL   BUN 17 6 - 23 mg/dL   Creatinine, Ser 1.05 0.50 - 1.10 mg/dL   Calcium 9.9 8.4 - 10.5 mg/dL  GFR calc non Af Amer 60 (L) >90 mL/min   GFR calc Af Amer 70 (L) >90 mL/min   Anion gap 15 5 - 15  I-stat troponin, ED  Result Value Ref Range   Troponin i, poc 0.01 0.00 - 0.08 ng/mL   Comment 3          I-stat troponin, ED  Result Value Ref Range   Troponin i, poc 0.00 0.00 - 0.08 ng/mL   Comment 3           Dg Chest 2 View  05/22/2014   CLINICAL DATA:  53 year old female with chronic shortness of breath while standing. History of hypertension. Twenty year pack history of smoking.  EXAM: CHEST  2 VIEW  COMPARISON:  Chest x-ray 01/27/2014.  FINDINGS: Mild diffuse peribronchial cuffing. Lung volumes are normal. No consolidative airspace disease. No pleural effusions. No pneumothorax. No pulmonary nodule or mass noted. Pulmonary vasculature and the cardiomediastinal silhouette are within normal limits. Atherosclerosis in the thoracic aorta.  IMPRESSION: 1. Mild diffuse peribronchial cuffing. This could indicate either acute or chronic bronchitis in this patient with history of smoking. 2. Atherosclerosis.   Electronically Signed   By: Vinnie Langton M.D.   On: 05/22/2014 20:56   Dg Knee Complete 4 Views Left  05/22/2014   CLINICAL DATA:  Lower anterior knee pain for 2 weeks.  EXAM: LEFT KNEE - COMPLETE 4+ VIEW  COMPARISON:  None  FINDINGS: There is sharpening of the tibial spines. No fracture or subluxation identified. No radiopaque foreign bodies identifiedThere is no evidence of fracture,  dislocation, or joint effusion.  IMPRESSION: No acute findings.   Electronically Signed   By: Kerby Moors M.D.   On: 05/22/2014 20:57   Mm Digital Diagnostic Unilat L  05/08/2014   CLINICAL DATA:  Callback for left breast mass  EXAM: DIGITAL DIAGNOSTIC LEFT MAMMOGRAM WITH CAD  ULTRASOUND LEFT BREAST  COMPARISON:  04/23/2014, 08/01/2012 and 11/15/2011  ACR Breast Density Category b: There are scattered areas of fibroglandular density.  FINDINGS: The patient returns for additional views of a 7 mm circumscribed mass identified in the approximately 6 o'clock position of the left breast, posterior depth. This mass persists on additional imaging. This finding appears to be present on the MLO mammogram from 2013.  Mammographic images were processed with CAD.  On physical exam, I palpated no suspicious abnormality.  Ultrasound is performed, showing a 0.7 x 0.4 x 0.5 cm parallel hypoechoic mass in the 6 o'clock position of the left breast, 6 cm from the nipple. This finding is probably benign and was likely present on prior mammograms as above described above, demonstrating stability.  IMPRESSION: Left breast mass is probably benign.  RECOMMENDATION: Recommend follow-up diagnostic left breast mammogram and ultrasound in 6 months to monitor for continued stability.  I have discussed the findings and recommendations with the patient. Results were also provided in writing at the conclusion of the visit. If applicable, a reminder letter will be sent to the patient regarding the next appointment.  BI-RADS CATEGORY  3: Probably benign.   Electronically Signed   By: Rosemarie Ax   On: 05/08/2014 09:36   Mm Digital Screening Bilateral  04/23/2014   CLINICAL DATA:  Screening.  EXAM: DIGITAL SCREENING BILATERAL MAMMOGRAM WITH CAD  COMPARISON:  Previous exam(s)  ACR Breast Density Category b: There are scattered areas of fibroglandular density.  FINDINGS: In the left breast, a possible mass warrants further evaluation  with spot compression views and possibly  ultrasound. In the right breast, no findings suspicious for malignancy.  Images were processed with CAD.  IMPRESSION: Further evaluation is suggested for possible mass in the left breast.  RECOMMENDATION: Diagnostic mammogram and possibly ultrasound of the left breast. (Code:FI-L-80M)  The patient will be contacted regarding the findings, and additional imaging will be scheduled.  BI-RADS CATEGORY  0: Incomplete. Need additional imaging evaluation and/or prior mammograms for comparison.   Electronically Signed   By: Lajean Manes M.D.   On: 04/23/2014 16:25   US Breast Ltd Uni Left Inc Axilla  05/08/2014   CLINICAL DATA:  Callback for left breast mass  EXAM: DIGITAL DIAGNOSTIC LEFT MAMMOGRAM WITH CAD  ULTRASOUND LEFT BREAST  COMPARISON:  04/23/2014, 08/01/2012 and 11/15/2011  ACR Breast Density Category b: There are scattered areas of fibroglandular density.  FINDINGS: The patient returns for additional views of a 7 mm circumscribed mass identified in the approximately 6 o'clock position of the left breast, posterior depth. This mass persists on additional imaging. This finding appears to be present on the MLO mammogram from 2013.  Mammographic images were processed with CAD.  On physical exam, I palpated no suspicious abnormality.  Ultrasound is performed, showing a 0.7 x 0.4 x 0.5 cm parallel hypoechoic mass in the 6 o'clock position of the left breast, 6 cm from the nipple. This finding is probably benign and was likely present on prior mammograms as above described above, demonstrating stability.  IMPRESSION: Left breast mass is probably benign.  RECOMMENDATION: Recommend follow-up diagnostic left breast mammogram and ultrasound in 6 months to monitor for continued stability.  I have discussed the findings and recommendations with the patient. Results were also provided in writing at the conclusion of the visit. If applicable, a reminder letter will be sent to the  patient regarding the next appointment.  BI-RADS CATEGORY  3: Probably benign.   Electronically Signed   By: Rosemarie Ax   On: 05/08/2014 09:36     Imaging Review Dg Chest 2 View  05/22/2014   CLINICAL DATA:  53 year old female with chronic shortness of breath while standing. History of hypertension. Twenty year pack history of smoking.  EXAM: CHEST  2 VIEW  COMPARISON:  Chest x-ray 01/27/2014.  FINDINGS: Mild diffuse peribronchial cuffing. Lung volumes are normal. No consolidative airspace disease. No pleural effusions. No pneumothorax. No pulmonary nodule or mass noted. Pulmonary vasculature and the cardiomediastinal silhouette are within normal limits. Atherosclerosis in the thoracic aorta.  IMPRESSION: 1. Mild diffuse peribronchial cuffing. This could indicate either acute or chronic bronchitis in this patient with history of smoking. 2. Atherosclerosis.   Electronically Signed   By: Vinnie Langton M.D.   On: 05/22/2014 20:56   Dg Knee Complete 4 Views Left  05/22/2014   CLINICAL DATA:  Lower anterior knee pain for 2 weeks.  EXAM: LEFT KNEE - COMPLETE 4+ VIEW  COMPARISON:  None  FINDINGS: There is sharpening of the tibial spines. No fracture or subluxation identified. No radiopaque foreign bodies identifiedThere is no evidence of fracture, dislocation, or joint effusion.  IMPRESSION: No acute findings.   Electronically Signed   By: Kerby Moors M.D.   On: 05/22/2014 20:57     EKG Interpretation None      MDM   Final diagnoses:  SOB (shortness of breath)  Left knee pain    Patient with left calf pain and swelling. 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 No history of DVT or PE. Patient  also reports associated shortness of breath. We'll check basic labs, will reassess.  Cannot use PERC criteria given age.  With calf swelling and pain will check d-dimer.  If negative plan for discharge to home.    D-dimer is negative.  Wells' DVT score is 1,  indicating low risk of DVT.  Will recommend PCP follow-up.    Will give knee sleeve for chronic knee pain.  Patient is stable and ready for discharge.    Montine Circle, PA-C 05/22/14 2126  Tanna Furry, MD 05/27/14 [phone]

## 2014-05-22 NOTE — ED Notes (Signed)
Patient transported to X-ray 

## 2014-05-22 NOTE — ED Notes (Addendum)
Patient returned from X-ray 

## 2014-05-22 NOTE — ED Notes (Signed)
The pt is c/o lt knee and calf pain for 2 weeks and she feels like her foot is going numb.  lmp none

## 2014-05-22 NOTE — Discharge Instructions (Signed)

## 2014-05-30 ENCOUNTER — Ambulatory Visit: Payer: Self-pay | Admitting: Family Medicine

## 2014-06-02 ENCOUNTER — Encounter: Payer: Self-pay | Admitting: Family Medicine

## 2014-06-02 ENCOUNTER — Ambulatory Visit (INDEPENDENT_AMBULATORY_CARE_PROVIDER_SITE_OTHER): Payer: Self-pay | Admitting: Family Medicine

## 2014-06-02 VITALS — BP 151/84 | HR 76 | Temp 98.4°F | Wt 188.0 lb

## 2014-06-02 DIAGNOSIS — M25562 Pain in left knee: Secondary | ICD-10-CM

## 2014-06-02 MED ORDER — METHYLPREDNISOLONE ACETATE 40 MG/ML IJ SUSP: 40.0000 mg | Freq: Once | INTRAMUSCULAR | Status: DC

## 2014-06-02 MED ORDER — LIDOCAINE HCL 1 % IJ SOLN: 5.0000 mL | Freq: Once | INTRAMUSCULAR | Status: DC

## 2014-06-02 MED ORDER — NAPROXEN 500 MG PO TABS: 500.0000 mg | ORAL_TABLET | Freq: Two times a day (BID) | ORAL | Status: DC

## 2014-06-02 MED ORDER — METHYLPREDNISOLONE ACETATE 40 MG/ML IJ SUSP
40.0000 mg | Freq: Once | INTRAMUSCULAR | Status: AC
  Administered 2014-06-02: 40 mg via INTRA_ARTICULAR

## 2014-06-02 MED ORDER — CYCLOBENZAPRINE HCL 5 MG PO TABS: 5.0000 mg | ORAL_TABLET | Freq: Three times a day (TID) | ORAL | Status: DC | PRN

## 2014-06-02 NOTE — Patient Instructions (Signed)
Knee Injection Joint injections are shots. Your caregiver will place a needle into your knee joint. The needle is used to put medicine into the joint. These shots can be used to help treat different painful knee conditions such as osteoarthritis, bursitis, local flare-ups of rheumatoid arthritis, and pseudogout. Anti-inflammatory medicines such as corticosteroids and anesthetics are the most common medicines used for joint and soft tissue injections.  PROCEDURE  The skin over the kneecap will be cleaned with an antiseptic solution.  Your caregiver will inject a small amount of a local anesthetic (a medicine like Novocaine) just under the skin in the area that was cleaned.  After the area becomes numb, a second injection is done. This second injection usually includes an anesthetic and an anti-inflammatory medicine called a steroid or cortisone. The needle is carefully placed in between the kneecap and the knee, and the medicine is injected into the joint space.  After the injection is done, the needle is removed. Your caregiver may place a bandage over the injection site. The whole procedure takes no more than a couple of minutes. BEFORE THE PROCEDURE  Wash all of the skin around the entire knee area. Try to remove any loose, scaling skin. There is no other specific preparation necessary unless advised otherwise by your caregiver. LET YOUR CAREGIVER KNOW ABOUT:   Allergies.  Medications taken including herbs, eye drops, over the counter medications, and creams.  Use of steroids (by mouth or creams).  Possible pregnancy, if applicable.  Previous problems with anesthetics or Novocaine.  History of blood clots (thrombophlebitis).  History of bleeding or blood problems.  Previous surgery.  Other health problems. RISKS AND COMPLICATIONS Side effects from cortisone shots are rare. They include:   Slight bruising of the skin.  Shrinkage of the normal fatty tissue under the skin where  the shot was given.  Increase in pain after the shot.  Infection.  Weakening of tendons or tendon rupture.  Allergic reaction to the medicine.  Diabetics may have a temporary increase in their blood sugar after a shot.  Cortisone can temporarily weaken the immune system. While receiving these shots, you should not get certain vaccines. Also, avoid contact with anyone who has chickenpox or measles. Especially if you have never had these diseases or have not been previously immunized. Your immune system may not be strong enough to fight off the infection while the cortisone is in your system. AFTER THE PROCEDURE   You can go home after the procedure.  You may need to put ice on the joint 15-20 minutes every 3 or 4 hours until the pain goes away.  You may need to put an elastic bandage on the joint. HOME CARE INSTRUCTIONS   Only take over-the-counter or prescription medicines for pain, discomfort, or fever as directed by your caregiver.  You should avoid stressing the joint. Unless advised otherwise, avoid activities that put a lot of pressure on a knee joint, such as:  Jogging.  Bicycling.  Recreational climbing.  Hiking.  Laying down and elevating the leg/knee above the level of your heart can help to minimize swelling. SEEK MEDICAL CARE IF:   You have repeated or worsening swelling.  There is drainage from the puncture area.  You develop red streaking that extends above or below the site where the needle was inserted. SEEK IMMEDIATE MEDICAL CARE IF:   You develop a fever.  You have pain that gets worse even though you are taking pain medicine.  The area is   red and warm, and you have trouble moving the joint. MAKE SURE YOU:   Understand these instructions.  Will watch your condition.  Will get help right away if you are not doing well or get worse. Document Released: 09/04/2006 Document Revised: 09/05/2011 Document Reviewed: 06/01/2007 [employer] Patient  Information 2015 [employer]. This information is not intended to replace advice given to you by your health care provider. Make sure you discuss any questions you have with your health care provider.  

## 2014-06-02 NOTE — Addendum Note (Signed)
Addended by: Valerie Roys on: 06/02/2014 04:38 PM   Modules accepted: Orders

## 2014-06-02 NOTE — Progress Notes (Signed)
   Subjective:    Patient ID: Teresa [name], female    DOB: 1960-12-02, 53 y.o.   MRN: [id number]  HPI  Left knee pain: started last November, diagnosed with gout then diagnosed with ?healed tibial plateau fracture.  Reports inciting factor was fall down stairs.  Pain again started in November this year, 3/10 up to 10/10.  Takes tylenol occasionally.  Has tried ICE/heating with no improvement, has knee immobilizer from ER which she uses at night occasionally and feels it helps somewhat.  Seen in ER last month for knee and calf pain, D dimer negative.  Occasionally feels unsteady, also feels it locks at times.  Brain aneurysm: reports she feels muscle spasm in neck, often has to stretch neck to help with discomfort and concerned she has brain aneurysm.  Review of Systems  Constitutional: Negative for chills and diaphoresis.  Respiratory: Negative for cough and shortness of breath.   Cardiovascular: Negative for leg swelling.  Gastrointestinal: Negative for abdominal pain, diarrhea, constipation and anal bleeding.  Endocrine: Negative for cold intolerance and heat intolerance.  Genitourinary: Positive for frequency. Negative for dysuria, urgency and decreased urine volume.  Musculoskeletal: Positive for joint swelling (left knee).  Neurological: Negative for headaches.       Objective:   Physical Exam  Constitutional: She appears well-developed and well-nourished. No distress.  HENT:  Mouth/Throat: Mucous membranes are moist. Pharynx is normal.  Eyes: Conjunctivae and EOM are normal.  Neck: No adenopathy.  Cardiovascular: Normal rate and S2 normal.   Pulmonary/Chest: Effort normal. No respiratory distress.  Abdominal: She exhibits no distension. There is no tenderness.  Musculoskeletal: Normal range of motion.  Left knee Pain with valgus and varus stress (equal pain), neg anterior/posterior drawer, left knee mild effusion.  No calf size discrepancy  Neurological: She is alert. No  cranial nerve deficit. Coordination normal.  Skin: Skin is warm. No rash noted. She is not diaphoretic. No pallor.     Left knee injection - discussed risks and benefits - area cleansed with betadine x 2 - in sterile fashion 4cc lidocaine 1% and 40mg  DepoMedrol mixed administered via anterior approach.  No complications - pt tolerated well, EBL 0, no complications.     Assessment & Plan:  53 yo female with left knee pain with significant hx of ?tibial plateau fracture - trial of injection, overall knee exam nonspecific - if no improvement would consider MRI given complaints of knee locking and instability. - rx naproxen 500mg  BID x 2 weeks, RF flexeril  Pt has questions about brain aneurysm, muscle spasm, need for yearly colonoscopy.  She is to follow up in clinic in 6 weeks to follow up any other complaints.

## 2014-07-04 ENCOUNTER — Other Ambulatory Visit: Payer: Self-pay | Admitting: *Deleted

## 2014-07-04 MED ORDER — SPIRONOLACTONE 50 MG PO TABS: 50.0000 mg | ORAL_TABLET | Freq: Every day | ORAL | Status: DC

## 2014-08-06 ENCOUNTER — Ambulatory Visit (INDEPENDENT_AMBULATORY_CARE_PROVIDER_SITE_OTHER): Payer: No Typology Code available for payment source | Admitting: Family Medicine

## 2014-08-06 ENCOUNTER — Encounter: Payer: Self-pay | Admitting: Family Medicine

## 2014-08-06 VITALS — BP 186/90 | HR 64 | Temp 99.1°F | Ht 65.0 in | Wt 188.0 lb

## 2014-08-06 DIAGNOSIS — I10 Essential (primary) hypertension: Secondary | ICD-10-CM

## 2014-08-06 DIAGNOSIS — J0110 Acute frontal sinusitis, unspecified: Secondary | ICD-10-CM

## 2014-08-06 MED ORDER — FLUTICASONE PROPIONATE 50 MCG/ACT NA SUSP: 2.0000 | Freq: Every day | NASAL | Status: DC

## 2014-08-06 MED ORDER — AMOXICILLIN-POT CLAVULANATE 875-125 MG PO TABS: 1.0000 | ORAL_TABLET | Freq: Two times a day (BID) | ORAL | Status: DC

## 2014-08-06 NOTE — Patient Instructions (Addendum)
Good to see you.  This is likely a viral sinusitis, but there is the chance it is bacterial especially with your left ear symptoms and face pain. - Take augmentin daily for 10 days. - Hydrate with plenty of warm fluids. Wash hands plenty. Use neti pot and get lots of rest. - Use steroid spray daily.  Find out about the BP medications (amlodipine and spironolactone) - if covered by your new insurance. - If not, let us know right away this week so Dr Gwendlyn Deutscher can call in something different. - She will want to see you in clinic to follow up your BP and other chronic issues. - If you have chest pain or trouble breathing, or other concerns, seek immediate care. - Keep workign on quitting smoking! Use the QUIT line for help (1-800-QUIT-NOW) - free, trained professionals who can help you quit!  Best,  Teresa Sinclair, MD

## 2014-08-06 NOTE — Progress Notes (Signed)
Patient ID: Teresa [name], female   DOB: Aug 12, 1960, 54 y.o.   MRN: [id number] Subjective:   CC: Headache, face pain  HPI:   Headache Patient has had headache feeling like pressure in eyes.  Also reports chills, sore throat x 3 days, difficulty hearing, left ear pain and itching, cough with whitish sputum, rhinorrhea, sneezing, and eye clear discharge with itching. Has been around multiple sick contacts at work. No vomiting or stomach discomfort. Mild diarrhea. Denies dyspnea.  Not hungry but is making self eat.  Voiding normally per usual but chronic difficulty with this. Has had subjective fever Mon night. No stiff neck. No rashes. Took some medication for decongestion (unsure if sudafed, mucinex, or what it was).  Blood pressure elevated Has not been taking amlodipine and spironolactone for some time because insurance previously did not cover. Now has new insurance and has not checked to see if they will cover. Denies dyspnea. Does not report chest pain. No LE edema. Reports taking lisinopril-HCTZ.  Review of Systems - Per HPI.   PMH - h/o chest pain, GERD, HTN, hypothyroidism, obesity, throat pain, and tobacco abuse  Smoking status: trying to cut back, current every day smoker    Objective:  Physical Exam BP 186/90 mmHg  Pulse 64  Temp(Src) 99.1 F (37.3 C)  Ht 5\' 5"  (1.651 m)  Wt 188 lb (85.276 kg)  BMI 31.28 kg/m2  Prior BP 212/90 GEN: NAD CV: RRR, no m/r/g PULM: CTAB, normal effort, occasional dry-sounding cough ABD: S/NT/ND EXTR: No LE edema or calf tenderness HEENT: AT/[place], sclera clear, PERRL, EOMI, o/p clear with MMM, tenderness bilateral frontal and maxillary sinuses, left TM with erythema and moderate purulence but no bulging; right TM mildly erythematous; neck supple, no LAD SKIN: No rash or cyanosis  Assessment:     Teresa [name] is a 54 y.o. female here for headache/face pain.    Plan:     # See problem list and after visit summary for  problem-specific plans. - Discuss chronic voiding "difficulty" with PCP at a follow up appt.  # Health Maintenance: Discussed f/u with PCP for HTN.   Follow-up: Follow up in 1 week if sinusitis symptoms not improving.   Hilton Sinclair, MD [place]

## 2014-08-06 NOTE — Assessment & Plan Note (Signed)
On 3 BP meds, but not taking amlodipine and spironolactone for "a while" because of new insurance. Elevated 212/90, no reported chest pain or dyspnea. Possibly due to medication, though pt not sure what she took for decongestion. Recheck 186/90. - Find out what new insurance will cover this week and inform PCP; if not covered, she will likely be started on another medication. - Follow up with Dr Gwendlyn Deutscher when available. - Return precautions reviewed (chest pain, dyspnea, other concerns) - Quit line information provided and urged cessation.

## 2014-08-06 NOTE — Assessment & Plan Note (Signed)
Sinusitis, likely viral but with erythema and purulence left TM and tenderness bilateral frontal sinuses, will tx with antibiotics. - Augmentin x 10 days. - Discussed hydration, hand washing, and nasal saline. Pt has neti pot. - Flonase nasal spray rx'ed.  - F/u 1 week if not improving or sooner if worsening.

## 2014-08-07 ENCOUNTER — Emergency Department (HOSPITAL_COMMUNITY)
Admission: EM | Admit: 2014-08-07 | Discharge: 2014-08-07 | Disposition: A | Discharge status: Home / Self Care | Payer: No Typology Code available for payment source | Attending: Emergency Medicine | Admitting: Emergency Medicine

## 2014-08-07 ENCOUNTER — Encounter (HOSPITAL_COMMUNITY): Payer: Self-pay | Admitting: Adult Health

## 2014-08-07 DIAGNOSIS — G43909 Migraine, unspecified, not intractable, without status migrainosus: Secondary | ICD-10-CM | POA: Diagnosis not present

## 2014-08-07 DIAGNOSIS — J0110 Acute frontal sinusitis, unspecified: Secondary | ICD-10-CM

## 2014-08-07 DIAGNOSIS — G8929 Other chronic pain: Secondary | ICD-10-CM | POA: Insufficient documentation

## 2014-08-07 DIAGNOSIS — R197 Diarrhea, unspecified: Secondary | ICD-10-CM | POA: Insufficient documentation

## 2014-08-07 DIAGNOSIS — Z8639 Personal history of other endocrine, nutritional and metabolic disease: Secondary | ICD-10-CM | POA: Insufficient documentation

## 2014-08-07 DIAGNOSIS — Z79899 Other long term (current) drug therapy: Secondary | ICD-10-CM | POA: Insufficient documentation

## 2014-08-07 DIAGNOSIS — Z91040 Latex allergy status: Secondary | ICD-10-CM | POA: Insufficient documentation

## 2014-08-07 DIAGNOSIS — Z720 Tobacco use: Secondary | ICD-10-CM | POA: Diagnosis not present

## 2014-08-07 DIAGNOSIS — Z7951 Long term (current) use of inhaled steroids: Secondary | ICD-10-CM | POA: Insufficient documentation

## 2014-08-07 DIAGNOSIS — Z8611 Personal history of tuberculosis: Secondary | ICD-10-CM | POA: Insufficient documentation

## 2014-08-07 DIAGNOSIS — R112 Nausea with vomiting, unspecified: Secondary | ICD-10-CM | POA: Diagnosis not present

## 2014-08-07 DIAGNOSIS — Z87828 Personal history of other (healed) physical injury and trauma: Secondary | ICD-10-CM | POA: Diagnosis not present

## 2014-08-07 DIAGNOSIS — R51 Headache: Secondary | ICD-10-CM

## 2014-08-07 DIAGNOSIS — R1013 Epigastric pain: Secondary | ICD-10-CM | POA: Diagnosis not present

## 2014-08-07 DIAGNOSIS — J0100 Acute maxillary sinusitis, unspecified: Secondary | ICD-10-CM | POA: Diagnosis not present

## 2014-08-07 DIAGNOSIS — Z862 Personal history of diseases of the blood and blood-forming organs and certain disorders involving the immune mechanism: Secondary | ICD-10-CM | POA: Diagnosis not present

## 2014-08-07 DIAGNOSIS — I10 Essential (primary) hypertension: Secondary | ICD-10-CM

## 2014-08-07 DIAGNOSIS — Z791 Long term (current) use of non-steroidal anti-inflammatories (NSAID): Secondary | ICD-10-CM | POA: Diagnosis not present

## 2014-08-07 DIAGNOSIS — Z8719 Personal history of other diseases of the digestive system: Secondary | ICD-10-CM | POA: Insufficient documentation

## 2014-08-07 DIAGNOSIS — R519 Headache, unspecified: Secondary | ICD-10-CM

## 2014-08-07 LAB — CBC WITH DIFFERENTIAL/PLATELET
Basophils Absolute: 0.1 10*3/uL (ref 0.0–0.1)
Basophils Relative: 1 % (ref 0–1)
Eosinophils Absolute: 0.2 10*3/uL (ref 0.0–0.7)
Eosinophils Relative: 4 % (ref 0–5)
HCT: 48.4 % — ABNORMAL HIGH (ref 36.0–46.0)
Hemoglobin: 16.4 g/dL — ABNORMAL HIGH (ref 12.0–15.0)
Lymphocytes Relative: 33 % (ref 12–46)
Lymphs Abs: 2.1 10*3/uL (ref 0.7–4.0)
MCH: 27.9 pg (ref 26.0–34.0)
MCHC: 33.9 g/dL (ref 30.0–36.0)
MCV: 82.5 fL (ref 78.0–100.0)
Monocytes Absolute: 0.2 10*3/uL (ref 0.1–1.0)
Monocytes Relative: 4 % (ref 3–12)
Neutro Abs: 3.7 10*3/uL (ref 1.7–7.7)
Neutrophils Relative %: 58 % (ref 43–77)
Platelets: 278 10*3/uL (ref 150–400)
RBC: 5.87 MIL/uL — ABNORMAL HIGH (ref 3.87–5.11)
RDW: 14 % (ref 11.5–15.5)
WBC: 6.3 10*3/uL (ref 4.0–10.5)

## 2014-08-07 LAB — COMPREHENSIVE METABOLIC PANEL
ALT: 36 U/L — ABNORMAL HIGH (ref 0–35)
AST: 28 U/L (ref 0–37)
Albumin: 4.4 g/dL (ref 3.5–5.2)
Alkaline Phosphatase: 79 U/L (ref 39–117)
Anion gap: 11 (ref 5–15)
BUN: 10 mg/dL (ref 6–23)
CO2: 28 mmol/L (ref 19–32)
Calcium: 10.2 mg/dL (ref 8.4–10.5)
Chloride: 99 mmol/L (ref 96–112)
Creatinine, Ser: 1.1 mg/dL (ref 0.50–1.10)
GFR calc Af Amer: 66 mL/min — ABNORMAL LOW (ref >90)
GFR calc non Af Amer: 57 mL/min — ABNORMAL LOW (ref >90)
Glucose, Bld: 120 mg/dL — ABNORMAL HIGH (ref 70–99)
Potassium: 3.7 mmol/L (ref 3.5–5.1)
Sodium: 138 mmol/L (ref 135–145)
Total Bilirubin: 0.8 mg/dL (ref 0.3–1.2)
Total Protein: 8.3 g/dL (ref 6.0–8.3)

## 2014-08-07 MED ORDER — KETOROLAC TROMETHAMINE 30 MG/ML IJ SOLN
30.0000 mg | Freq: Once | INTRAMUSCULAR | Status: AC
  Administered 2014-08-07: 30 mg via INTRAVENOUS
  Filled 2014-08-07: qty 1

## 2014-08-07 MED ORDER — SODIUM CHLORIDE 0.9 % IV BOLUS (SEPSIS)
1000.0000 mL | Freq: Once | INTRAVENOUS | Status: AC
  Administered 2014-08-07: 1000 mL via INTRAVENOUS

## 2014-08-07 MED ORDER — METOCLOPRAMIDE HCL 5 MG/ML IJ SOLN
10.0000 mg | Freq: Once | INTRAMUSCULAR | Status: AC
  Administered 2014-08-07: 10 mg via INTRAVENOUS
  Filled 2014-08-07: qty 2

## 2014-08-07 MED ORDER — METOCLOPRAMIDE HCL 10 MG PO TABS
10.0000 mg | ORAL_TABLET | Freq: Four times a day (QID) | ORAL | Status: DC | PRN
Start: 2014-08-07 — End: 2015-08-13

## 2014-08-07 MED ORDER — NAPROXEN 500 MG PO TABS: 500.0000 mg | ORAL_TABLET | Freq: Two times a day (BID) | ORAL | Status: DC | PRN

## 2014-08-07 MED ORDER — DIPHENHYDRAMINE HCL 25 MG PO TABS: 25.0000 mg | ORAL_TABLET | Freq: Four times a day (QID) | ORAL | Status: DC | PRN

## 2014-08-07 MED ORDER — DOXYCYCLINE HYCLATE 100 MG PO CAPS: 100.0000 mg | ORAL_CAPSULE | Freq: Two times a day (BID) | ORAL | Status: DC

## 2014-08-07 MED ORDER — DIPHENHYDRAMINE HCL 50 MG/ML IJ SOLN
25.0000 mg | Freq: Once | INTRAMUSCULAR | Status: AC
Start: 2014-08-07 — End: 2014-08-07
  Administered 2014-08-07: 25 mg via INTRAVENOUS
  Filled 2014-08-07: qty 1

## 2014-08-07 NOTE — ED Provider Notes (Signed)
CSN: [id number]     Arrival date & time 08/07/14  1850 History   First MD Initiated Contact with Patient 08/07/14 1902     Chief Complaint  Patient presents with  . Headache     (Consider location/radiation/quality/duration/timing/severity/associated sxs/prior Treatment) HPI Comments: Teresa [name] is a 54 y.o. female with a PMHx of HTN, chronic migraines, chronic back/neck pain, Graves' disease, anemia, and hiatal hernia, with a PSHx of appendectomy and abd hysterectomy, who presents to the ED with complaints of gradual onset headache 3 days. She states that she was seen yesterday by her PCP for this headache as well as for sinus congestion and left ear pain, where she was diagnosed with sinusitis and left ear infection and given Augmentin and flonase. She states that she has been using these medications with no relief of her symptoms. She states that her headache is currently 10/10 frontal pressure-like pain behind her eyes, nonradiating, constant, worse with light exposure, and unrelieved with resting in a dark room, Flexeril, Flonase, and Augmentin. She states she has had one episode of nonbloody nonbilious emesis today, and diarrhea which is watery and nonbloody. She states she is unsure of how many episodes of diarrhea she has had, but states that it started after she took Augmentin. Chart Review reveals that she did have reports of diarrhea yesterday, and when asked patient states that she doesn't recall that she may have reported diarrhea yesterday as well. She states she continues to have sinus congestion. She reports associated lightheadedness along with this headache. She denies any fevers, chills, neck pain or stiffness, chest pain, shortness of breath, abdominal pain, constipation, obstipation, melena, hematochezia, dysuria, hematuria, vaginal bleeding or discharge, vision changes, numbness, tingling, weakness, or syncope. Denies any rashes. Reports sick contacts at work with these symptoms.     Patient is a 54 y.o. female presenting with headaches. The history is provided by the patient. No language interpreter was used.  Headache Pain location:  Frontal Quality: pressure. Radiates to:  Does not radiate Severity currently:  10/10 Severity at highest:  10/10 Onset quality:  Gradual Duration:  3 days Timing:  Constant Progression:  Unchanged Chronicity:  Recurrent Similar to prior headaches: yes   Context: bright light   Relieved by:  Nothing Worsened by:  Light Ineffective treatments:  Resting in a darkened room and prescription medications (flexeril, flonase, and augmentin) Associated symptoms: congestion, diarrhea, dizziness, nausea, photophobia, sinus pressure, sore throat, URI and vomiting   Associated symptoms: no abdominal pain, no back pain, no blurred vision, no cough, no drainage, no ear pain, no eye pain, no facial pain, no fever, no focal weakness, no hearing loss, no loss of balance, no myalgias, no near-syncope, no neck pain, no neck stiffness, no numbness, no paresthesias, no syncope, no tingling, no visual change and no weakness     Past Medical History  Diagnosis Date  . Hypertension   . Anemia   . Neck strain 02/06/2012  . Graves' disease   . Positive TB test   . H/O hiatal hernia   . Migraine headache     "couple times/month" (02/12/2013)  . Chronic lower back pain    Past Surgical History  Procedure Laterality Date  . Cesarean section  1979 1986 1990  . Appendectomy  2006  . Abdominal hysterectomy  2006   Family History  Problem Relation Age of Onset  . Heart disease Other   . Diabetes Other   . Heart disease Mother     CAB  age 73  . Diabetes Mother   . Cancer Father 11    colon cancer  . Stroke Father   . Diabetes Sister   . Kidney disease Sister     renal failure  . Diabetes Brother   . Kidney disease Brother     renal failure   History  Substance Use Topics  . Smoking status: Current Every Day Smoker -- 0.25 packs/day for 30  years    Types: Cigarettes  . Smokeless tobacco: Never Used  . Alcohol Use: No   OB History    No data available     Review of Systems  Constitutional: Negative for fever and chills.  HENT: Positive for congestion, sinus pressure and sore throat. Negative for ear discharge, ear pain, hearing loss and postnasal drip.   Eyes: Positive for photophobia. Negative for blurred vision and pain.  Respiratory: Negative for cough and shortness of breath.   Cardiovascular: Negative for chest pain, syncope and near-syncope.  Gastrointestinal: Positive for nausea, vomiting and diarrhea. Negative for abdominal pain, constipation and blood in stool.  Genitourinary: Negative for dysuria, hematuria, vaginal bleeding and vaginal discharge.  Musculoskeletal: Negative for myalgias, back pain, arthralgias, neck pain and neck stiffness.  Skin: Negative for rash.  Neurological: Positive for dizziness and headaches. Negative for tremors, focal weakness, syncope, weakness, numbness, paresthesias and loss of balance.  Psychiatric/Behavioral: Negative for confusion.   10 Systems reviewed and are negative for acute change except as noted in the HPI.    Allergies  Clonidine derivatives; Aspirin; and Latex  Home Medications   Prior to Admission medications   Medication Sig Start Date End Date Taking? Authorizing Provider  amLODipine (NORVASC) 10 MG tablet Take 1 tablet (10 mg total) by mouth at bedtime. 01/28/14 01/28/15  [name], MD  amoxicillin-clavulanate (AUGMENTIN) 875-125 MG per tablet Take 1 tablet by mouth 2 (two) times daily. 08/06/14   Hilton Sinclair, MD  cyclobenzaprine (FLEXERIL) 5 MG tablet Take 1 tablet (5 mg total) by mouth 3 (three) times daily as needed for muscle spasms. 06/02/14   Merla Riches, MD  fluticasone [facility]) 50 MCG/ACT nasal spray Place 2 sprays into both nostrils daily. 08/06/14   Hilton Sinclair, MD  Hypromellose (ARTIFICIAL TEARS OP) Place 1 drop into both  eyes as needed (for dry eyes).    Historical Provider, MD  levothyroxine (SYNTHROID, LEVOTHROID) 100 MCG tablet Take 100 mcg by mouth daily before breakfast.    Historical Provider, MD  lisinopril-hydrochlorothiazide (PRINZIDE,ZESTORETIC) 20-25 MG per tablet Take 1 tablet by mouth daily. 03/17/14   [name], MD  loratadine (CLARITIN) 10 MG tablet Take 10 mg by mouth daily.    Historical Provider, MD  metoprolol tartrate (LOPRESSOR) 25 MG tablet Take 12.5 mg by mouth 2 (two) times daily.    Historical Provider, MD  Multiple Vitamin (MULTIVITAMIN WITH MINERALS) TABS tablet Take 1 tablet by mouth daily.    Historical Provider, MD  naproxen (NAPROSYN) 500 MG tablet Take 1 tablet (500 mg total) by mouth 2 (two) times daily with a meal. 06/02/14   Merla Riches, MD  oxyCODONE-acetaminophen (PERCOCET/ROXICET) 5-325 MG per tablet Take 2 tablets by mouth every 6 (six) hours as needed for severe pain. 05/22/14   Montine Circle, PA-C  spironolactone (ALDACTONE) 50 MG tablet Take 1 tablet (50 mg total) by mouth daily. 07/04/14   [name], MD   BP 179/97 mmHg  Pulse 70  Temp(Src) 98 F (36.7 C) (Oral)  Resp 18  SpO2 99% Physical Exam  Constitutional: She is oriented to person, place, and time. She appears well-developed and well-nourished.  Non-toxic appearance. She appears distressed (appears uncomfortable).  Afebrile, nontoxic, uncomfortable appearing covering her eyes, mildly hypertensive but during exam noted to have BP of 130s/90s. VSS otherwise.   HENT:  Head: Normocephalic and atraumatic.  Right Ear: Hearing, tympanic membrane, external ear and ear canal normal.  Left Ear: Hearing, external ear and ear canal normal. Tympanic membrane is injected.  Nose: Mucosal edema and rhinorrhea present. Right sinus exhibits maxillary sinus tenderness and frontal sinus tenderness. Left sinus exhibits maxillary sinus tenderness and frontal sinus tenderness.  Mouth/Throat: Uvula is midline,  oropharynx is clear and moist and mucous membranes are normal. No trismus in the jaw. No uvula swelling.  Nose with b/l mucosal edema and erythema and mild rhinorrhea. B/L nasal sinuses TTP. Oropharynx clear and moist Mildly injected left TM, no bulging  Eyes: Conjunctivae and EOM are normal. Pupils are equal, round, and reactive to light. Right eye exhibits no discharge. Left eye exhibits no discharge.  PERRL, EOMI, no nystagmus  Neck: Normal range of motion. Neck supple. No spinous process tenderness and no muscular tenderness present. No rigidity. Normal range of motion present.  FROM intact without spinous process or paraspinous muscle TTP, no bony stepoffs or deformities, no muscle spasms. No rigidity or meningeal signs. No bruising or swelling.   Cardiovascular: Normal rate, regular rhythm, normal heart sounds and intact distal pulses.  Exam reveals no gallop and no friction rub.   No murmur heard. RRR, nl s1/s2, no m/r/g, distal pulses intact, no pedal edema   Pulmonary/Chest: Effort normal and breath sounds normal. No respiratory distress. She has no decreased breath sounds. She has no wheezes. She has no rhonchi. She has no rales.  CTAB in all lung fields, no w/r/r  Abdominal: Soft. Normal appearance and bowel sounds are normal. She exhibits no distension. There is tenderness in the epigastric area. There is no rigidity, no rebound, no guarding, no CVA tenderness, no tenderness at McBurney's point and negative [name]'s sign.    Soft, obese but nondistended, +BS throughout, with very mild epigastric TTP, no r/g/r, neg murphy's, neg mcburney's, no CVA TTP   Musculoskeletal: Normal range of motion.  MAE x4 Strength 5/5 in all extremities Sensation grossly intact in all extremities Distal pulses intact without pedal edema Gait steady  Neurological: She is alert and oriented to person, place, and time. She has normal strength. No cranial nerve deficit or sensory deficit. She displays a  negative Romberg sign. Coordination and gait normal. GCS eye subscore is 4. GCS verbal subscore is 5. GCS motor subscore is 6.  CN 2-12 grossly intact A&O x4 GCS 15 Sensation and strength intact Gait nonataxic including with tandem walking Coordination with finger-to-nose WNL Neg romberg, neg pronator drift   Skin: Skin is warm, dry and intact. No rash noted.  No rashes  Psychiatric: She has a normal mood and affect.  Nursing note and vitals reviewed.   ED Course  Procedures (including critical care time) Labs Review Labs Reviewed  CBC WITH DIFFERENTIAL/PLATELET - Abnormal; Notable for the following:    RBC 5.87 (*)    Hemoglobin 16.4 (*)    HCT 48.4 (*)    All other components within normal limits  COMPREHENSIVE METABOLIC PANEL - Abnormal; Notable for the following:    Glucose, Bld 120 (*)    ALT 36 (*)    GFR calc non Af Amer 57 (*)  GFR calc Af Amer 66 (*)    All other components within normal limits    Imaging Review No results found.   EKG Interpretation None      MDM   Final diagnoses:  Acute frontal sinusitis, recurrence not specified  Sinus headache  Nausea vomiting and diarrhea  Essential hypertension    54 y.o. female with headache x3 days. Diagnosed with sinusitis and L ear infection, given augmentin. States her n/v/d got worse since starting this medication, and she feels her headache worsened as well. States this headache is somewhat similar to prior headaches, although states the location is behind her eyes mostly which is different than her migraines, and that it won't go away. Nonfocal neuro exam, no meningismus. Very tender over sinuses bilaterally. No red flag s/sx, doubt SAH, TIA/CVA, meningitis, temporal arteritis, or other intracranial process requiring further CT imaging. Pt is hypertensive, but she denies symptoms of CP/SOB/LE edema, and she is actually less hypertensive than yesterday. Admits not taking all of her BP meds, only taking  lopressor, spironolactone, and lisinopril-HCTZ but not amlodipine. On exam her BP had actually improved down to the 130s/90s. Given ongoing n/v/d, will get basic labs. For her HA, will give migraine cocktail. Will reassess shortly.   9:19 PM BP now 148/64, headache completely resolved, nausea resolved. Labs reveal some hemoconcentration but overall unremarkable. Will PO challenge now. Will send home with reglan, naprosyn, and doxycycline to be used only if she uses augmentin today for second dose and still feels it's making her diarrhea worse. I believe the diarrhea was there before augmentin was started since chart review reveals this to be true. Pt willing to try augmentin but understands for severe persistent diarrhea with augmentin she can switch to doxycycline.   9:43 PM Tolerating PO well. Will d/c home with prior instructions. Will have her f/up with PCP in 3-5 days for ongoing re-evaluation and management. I explained the diagnosis and have given explicit precautions to return to the ER including for any other new or worsening symptoms. The patient understands and accepts the medical plan as it's been dictated and I have answered their questions. Discharge instructions concerning home care and prescriptions have been given. The patient is STABLE and is discharged to home in good condition.   BP 147/84 mmHg  Pulse 64  Temp(Src) 98 F (36.7 C) (Oral)  Resp 18  SpO2 97%  Meds ordered this encounter  Medications  . metoCLOPramide (REGLAN) injection 10 mg    Sig:    And  . diphenhydrAMINE (BENADRYL) injection 25 mg    Sig:    And  . sodium chloride 0.9 % bolus 1,000 mL    Sig:    And  . ketorolac (TORADOL) 30 MG/ML injection 30 mg    Sig:   . metoCLOPramide (REGLAN) 10 MG tablet    Sig: Take 1 tablet (10 mg total) by mouth every 6 (six) hours as needed for nausea (nausea/headache).    Dispense:  6 tablet    Refill:  0    Order Specific Question:  Supervising Provider    Answer:   Noemi Chapel D [[id number]]  . diphenhydrAMINE (BENADRYL) 25 MG tablet    Sig: Take 1 tablet (25 mg total) by mouth every 6 (six) hours as needed for sleep (with reglan for headaches).    Dispense:  10 tablet    Refill:  0    Order Specific Question:  Supervising Provider    Answer:  Johnna Acosta [  [id number]]  . naproxen (NAPROSYN) 500 MG tablet    Sig: Take 1 tablet (500 mg total) by mouth 2 (two) times daily as needed for mild pain, moderate pain or headache (TAKE WITH MEALS.).    Dispense:  20 tablet    Refill:  0    Order Specific Question:  Supervising Provider    Answer:  Noemi Chapel D [[id number]]  . doxycycline (VIBRAMYCIN) 100 MG capsule    Sig: Take 1 capsule (100 mg total) by mouth 2 (two) times daily. One po bid x 7 days. START ONLY IF YOU STOP TAKING AUGMENTIN DUE TO SIDE EFFECTS    Dispense:  14 capsule    Refill:  0    Order Specific Question:  Supervising Provider    Answer:  Johnna Acosta [address] [place], PA-C 08/07/14 2143  Dorie Rank, MD 08/07/14 2146

## 2014-08-07 NOTE — Discharge Instructions (Signed)
Continue to stay well-hydrated. Gargle warm salt water and spit it out. Continue to alternate between Tylenol and naprosyn for pain or fever. Use netipot and flonase to help with nasal congestion. Use reglan with benadryl for headaches and nausea. Continue to take Augmentin but if your diarrhea persists then you can switch to using doxycycline. Followup with your primary care doctor in 3-5 days for recheck of ongoing symptoms. Return to emergency department for emergent changing or worsening of symptoms. Stay well hydrated with small sips of fluids throughout the day. Follow a BRAT (banana-rice-applesauce-toast) diet as described below for the next 24-48 hours. The 'BRAT' diet is suggested, then progress to diet as tolerated as symptoms abate. Call if bloody stools, persistent diarrhea, vomiting, fever or abdominal pain. Return to ER for changing or worsening of symptoms.  Food Choices to Help Relieve Diarrhea When you have diarrhea, the foods you eat and your eating habits are very important. Choosing the right foods and drinks can help relieve diarrhea. Also, because diarrhea can last up to 7 days, you need to replace lost fluids and electrolytes (such as sodium, potassium, and chloride) in order to help prevent dehydration.  WHAT GENERAL GUIDELINES DO I NEED TO FOLLOW?  Slowly drink 1 cup (8 oz) of fluid for each episode of diarrhea. If you are getting enough fluid, your urine will be clear or pale yellow.  Eat starchy foods. Some good choices include white rice, white toast, pasta, low-fiber cereal, baked potatoes (without the skin), saltine crackers, and bagels.  Avoid large servings of any cooked vegetables.  Limit fruit to two servings per day. A serving is  cup or 1 small piece.  Choose foods with less than 2 g of fiber per serving.  Limit fats to less than 8 tsp (38 g) per day.  Avoid fried foods.  Eat foods that have probiotics in them. Probiotics can be found in certain dairy  products.  Avoid foods and beverages that may increase the speed at which food moves through the stomach and intestines (gastrointestinal tract). Things to avoid include:  High-fiber foods, such as dried fruit, raw fruits and vegetables, nuts, seeds, and whole grain foods.  Spicy foods and high-fat foods.  Foods and beverages sweetened with high-fructose corn syrup, honey, or sugar alcohols such as xylitol, sorbitol, and mannitol. WHAT FOODS ARE RECOMMENDED? Grains White rice. White, [place], or pita breads (fresh or toasted), including plain rolls, buns, or bagels. White pasta. Saltine, soda, or graham crackers. Pretzels. Low-fiber cereal. Cooked cereals made with water (such as cornmeal, farina, or cream cereals). Plain muffins. Matzo. Melba toast. Zwieback.  Vegetables Potatoes (without the skin). Strained tomato and vegetable juices. Most well-cooked and canned vegetables without seeds. Tender lettuce. Fruits Cooked or canned applesauce, apricots, cherries, fruit cocktail, grapefruit, peaches, pears, or plums. Fresh bananas, apples without skin, cherries, grapes, cantaloupe, grapefruit, peaches, oranges, or plums.  Meat and Other Protein Products Baked or boiled chicken. Eggs. Tofu. Fish. Seafood. Smooth peanut butter. Ground or well-cooked tender beef, ham, veal, lamb, pork, or poultry.  Dairy Plain yogurt, kefir, and unsweetened liquid yogurt. Lactose-free milk, buttermilk, or soy milk. Plain hard cheese. Beverages Sport drinks. Clear broths. Diluted fruit juices (except prune). Regular, caffeine-free sodas such as ginger ale. Water. Decaffeinated teas. Oral rehydration solutions. Sugar-free beverages not sweetened with sugar alcohols. Other Bouillon, broth, or soups made from recommended foods.  The items listed above may not be a complete list of recommended foods or beverages. Contact your dietitian for more  options. WHAT FOODS ARE NOT RECOMMENDED? Grains Whole grain, whole wheat,  bran, or rye breads, rolls, pastas, crackers, and cereals. Wild or brown rice. Cereals that contain more than 2 g of fiber per serving. Corn tortillas or taco shells. Cooked or dry oatmeal. Granola. Popcorn. Vegetables Raw vegetables. Cabbage, broccoli, Brussels sprouts, artichokes, baked beans, beet greens, corn, kale, legumes, peas, sweet potatoes, and yams. Potato skins. Cooked spinach and cabbage. Fruits Dried fruit, including raisins and dates. Raw fruits. Stewed or dried prunes. Fresh apples with skin, apricots, mangoes, pears, raspberries, and strawberries.  Meat and Other Protein Products Chunky peanut butter. Nuts and seeds. Beans and lentils. Berniece Salines.  Dairy High-fat cheeses. Milk, chocolate milk, and beverages made with milk, such as milk shakes. Cream. Ice cream. Sweets and Desserts Sweet rolls, doughnuts, and sweet breads. Pancakes and waffles. Fats and Oils Butter. Cream sauces. Margarine. Salad oils. Plain salad dressings. Olives. Avocados.  Beverages Caffeinated beverages (such as coffee, tea, soda, or energy drinks). Alcoholic beverages. Fruit juices with pulp. Prune juice. Soft drinks sweetened with high-fructose corn syrup or sugar alcohols. Other Coconut. Hot sauce. Chili powder. Mayonnaise. Gravy. Cream-based or milk-based soups.  The items listed above may not be a complete list of foods and beverages to avoid. Contact your dietitian for more information. WHAT SHOULD I DO IF I BECOME DEHYDRATED? Diarrhea can sometimes lead to dehydration. Signs of dehydration include dark urine and dry mouth and skin. If you think you are dehydrated, you should rehydrate with an oral rehydration solution. These solutions can be purchased at pharmacies, retail stores, or online.  Drink -1 cup (120-240 mL) of oral rehydration solution each time you have an episode of diarrhea. If drinking this amount makes your diarrhea worse, try drinking smaller amounts more often. For example, drink 1-3 tsp  (5-15 mL) every 5-10 minutes.  A general rule for staying hydrated is to drink 1-2 L of fluid per day. Talk to your health care provider about the specific amount you should be drinking each day. Drink enough fluids to keep your urine clear or pale yellow. Document Released: 09/03/2003 Document Revised: 06/18/2013 Document Reviewed: 05/06/2013 [facility] Patient Information 2015 [place], [place]. This information is not intended to replace advice given to you by your health care provider. Make sure you discuss any questions you have with your health care provider.    Sinusitis Sinusitis is redness, soreness, and inflammation of the paranasal sinuses. Paranasal sinuses are air pockets within the bones of your face (beneath the eyes, the middle of the forehead, or above the eyes). In healthy paranasal sinuses, mucus is able to drain out, and air is able to circulate through them by way of your nose. However, when your paranasal sinuses are inflamed, mucus and air can become trapped. This can allow bacteria and other germs to grow and cause infection. Sinusitis can develop quickly and last only a short time (acute) or continue over a long period (chronic). Sinusitis that lasts for more than 12 weeks is considered chronic.  CAUSES  Causes of sinusitis include:  Allergies.  Structural abnormalities, such as displacement of the cartilage that separates your nostrils (deviated septum), which can decrease the air flow through your nose and sinuses and affect sinus drainage.  Functional abnormalities, such as when the small hairs (cilia) that line your sinuses and help remove mucus do not work properly or are not present. SIGNS AND SYMPTOMS  Symptoms of acute and chronic sinusitis are the same. The primary symptoms are pain and  pressure around the affected sinuses. Other symptoms include:  Upper toothache.  Earache.  Headache.  Bad breath.  Decreased sense of smell and taste.  A cough, which  worsens when you are lying flat.  Fatigue.  Fever.  Thick drainage from your nose, which often is green and may contain pus (purulent).  Swelling and warmth over the affected sinuses. DIAGNOSIS  Your health care provider will perform a physical exam. During the exam, your health care provider may:  Look in your nose for signs of abnormal growths in your nostrils (nasal polyps).  Tap over the affected sinus to check for signs of infection.  View the inside of your sinuses (endoscopy) using an imaging device that has a light attached (endoscope). If your health care provider suspects that you have chronic sinusitis, one or more of the following tests may be recommended:  Allergy tests.  Nasal culture. A sample of mucus is taken from your nose, sent to a lab, and screened for bacteria.  Nasal cytology. A sample of mucus is taken from your nose and examined by your health care provider to determine if your sinusitis is related to an allergy. TREATMENT  Most cases of acute sinusitis are related to a viral infection and will resolve on their own within 10 days. Sometimes medicines are prescribed to help relieve symptoms (pain medicine, decongestants, nasal steroid sprays, or saline sprays).  However, for sinusitis related to a bacterial infection, your health care provider will prescribe antibiotic medicines. These are medicines that will help kill the bacteria causing the infection.  Rarely, sinusitis is caused by a fungal infection. In theses cases, your health care provider will prescribe antifungal medicine. For some cases of chronic sinusitis, surgery is needed. Generally, these are cases in which sinusitis recurs more than 3 times per year, despite other treatments. HOME CARE INSTRUCTIONS   Drink plenty of water. Water helps thin the mucus so your sinuses can drain more easily.  Use a humidifier.  Inhale steam 3 to 4 times a day (for example, sit in the bathroom with the shower  running).  Apply a warm, moist washcloth to your face 3 to 4 times a day, or as directed by your health care provider.  Use saline nasal sprays to help moisten and clean your sinuses.  Take medicines only as directed by your health care provider.  If you were prescribed either an antibiotic or antifungal medicine, finish it all even if you start to feel better. SEEK IMMEDIATE MEDICAL CARE IF:  You have increasing pain or severe headaches.  You have nausea, vomiting, or drowsiness.  You have swelling around your face.  You have vision problems.  You have a stiff neck.  You have difficulty breathing. MAKE SURE YOU:   Understand these instructions.  Will watch your condition.  Will get help right away if you are not doing well or get worse. Document Released: 06/13/2005 Document Revised: 10/28/2013 Document Reviewed: 06/28/2011 [facility] Patient Information 2015 [place], [place]. This information is not intended to replace advice given to you by your health care provider. Make sure you discuss any questions you have with your health care provider.  Sinus Headache A sinus headache happens when your sinuses become clogged or puffy (swollen). Sinus headaches can be mild or severe. HOME CARE  Take your medicines (antibiotics) as told. Finish them even if you start to feel better.  Only take medicine as told by your doctor.  Use a nose spray if you feel stuffed up (  congested). GET HELP RIGHT AWAY IF:  You have a fever.  You have trouble seeing.  You suddenly have pain in your face or head.  You start to twitch or shake (seizure).  You are confused.  You get headaches more than once a week.  Light or sound bothers you.  You feel sick to your stomach (nauseous) or throw up (vomit).  Your headaches do not get better with treatment. MAKE SURE YOU:  Understand these instructions.  Will watch your condition.  Will get help right away if you are not doing well or get  worse. Document Released: 10/13/2010 Document Revised: 09/05/2011 Document Reviewed: 10/13/2010 [facility] Patient Information 2015 [place], [place]. This information is not intended to replace advice given to you by your health care provider. Make sure you discuss any questions you have with your health care provider.  Viral Infections A virus is a type of germ. Viruses can cause:  Minor sore throats.  Aches and pains.  Headaches.  Runny nose.  Rashes.  Watery eyes.  Tiredness.  Coughs.  Loss of appetite.  Feeling sick to your stomach (nausea).  Throwing up (vomiting).  Watery poop (diarrhea). HOME CARE   Only take medicines as told by your doctor.  Drink enough water and fluids to keep your pee (urine) clear or pale yellow. Sports drinks are a good choice.  Get plenty of rest and eat healthy. Soups and broths with crackers or rice are fine. GET HELP RIGHT AWAY IF:   You have a very bad headache.  You have shortness of breath.  You have chest pain or neck pain.  You have an unusual rash.  You cannot stop throwing up.  You have watery poop that does not stop.  You cannot keep fluids down.  You or your child has a temperature by mouth above 102 F (38.9 C), not controlled by medicine.  Your baby is older than 3 months with a rectal temperature of 102 F (38.9 C) or higher.  Your baby is 18 months old or younger with a rectal temperature of 100.4 F (38 C) or higher. MAKE SURE YOU:   Understand these instructions.  Will watch this condition.  Will get help right away if you are not doing well or get worse. Document Released: 05/26/2008 Document Revised: 09/05/2011 Document Reviewed: 10/19/2010 [facility] Patient Information 2015 [place], [place]. This information is not intended to replace advice given to you by your health care provider. Make sure you discuss any questions you have with your health care provider.  Nausea and Vomiting Nausea is a sick  feeling that often comes before throwing up (vomiting). Vomiting is a reflex where stomach contents come out of your mouth. Vomiting can cause severe loss of body fluids (dehydration). Children and elderly adults can become dehydrated quickly, especially if they also have diarrhea. Nausea and vomiting are symptoms of a condition or disease. It is important to find the cause of your symptoms. CAUSES   Direct irritation of the stomach lining. This irritation can result from increased acid production (gastroesophageal reflux disease), infection, food poisoning, taking certain medicines (such as nonsteroidal anti-inflammatory drugs), alcohol use, or tobacco use.  Signals from the brain.These signals could be caused by a headache, heat exposure, an inner ear disturbance, increased pressure in the brain from injury, infection, a tumor, or a concussion, pain, emotional stimulus, or metabolic problems.  An obstruction in the gastrointestinal tract (bowel obstruction).  Illnesses such as diabetes, hepatitis, gallbladder problems, appendicitis, kidney problems, cancer,  sepsis, atypical symptoms of a heart attack, or eating disorders.  Medical treatments such as chemotherapy and radiation.  Receiving medicine that makes you sleep (general anesthetic) during surgery. DIAGNOSIS Your caregiver may ask for tests to be done if the problems do not improve after a few days. Tests may also be done if symptoms are severe or if the reason for the nausea and vomiting is not clear. Tests may include:  Urine tests.  Blood tests.  Stool tests.  Cultures (to look for evidence of infection).  X-rays or other imaging studies. Test results can help your caregiver make decisions about treatment or the need for additional tests. TREATMENT You need to stay well hydrated. Drink frequently but in small amounts.You may wish to drink water, sports drinks, clear broth, or eat frozen ice pops or gelatin dessert to help stay  hydrated.When you eat, eating slowly may help prevent nausea.There are also some antinausea medicines that may help prevent nausea. HOME CARE INSTRUCTIONS   Take all medicine as directed by your caregiver.  If you do not have an appetite, do not force yourself to eat. However, you must continue to drink fluids.  If you have an appetite, eat a normal diet unless your caregiver tells you differently.  Eat a variety of complex carbohydrates (rice, wheat, potatoes, bread), lean meats, yogurt, fruits, and vegetables.  Avoid high-fat foods because they are more difficult to digest.  Drink enough water and fluids to keep your urine clear or pale yellow.  If you are dehydrated, ask your caregiver for specific rehydration instructions. Signs of dehydration may include:  Severe thirst.  Dry lips and mouth.  Dizziness.  Dark urine.  Decreasing urine frequency and amount.  Confusion.  Rapid breathing or pulse. SEEK IMMEDIATE MEDICAL CARE IF:   You have blood or brown flecks (like coffee grounds) in your vomit.  You have black or bloody stools.  You have a severe headache or stiff neck.  You are confused.  You have severe abdominal pain.  You have chest pain or trouble breathing.  You do not urinate at least once every 8 hours.  You develop cold or clammy skin.  You continue to vomit for longer than 24 to 48 hours.  You have a fever. MAKE SURE YOU:   Understand these instructions.  Will watch your condition.  Will get help right away if you are not doing well or get worse. Document Released: 06/13/2005 Document Revised: 09/05/2011 Document Reviewed: 11/10/2010 [facility] Patient Information 2015 [place], [place]. This information is not intended to replace advice given to you by your health care provider. Make sure you discuss any questions you have with your health care provider.

## 2014-08-07 NOTE — ED Notes (Addendum)
Presents with 3 days of headache, she was seen by her primary yesterday and told she had an ear infection, placed on Amoxicillin-since starting the amox, headache is worse associated with nausea, light and sound sensitivity and vomiting and diarrhea. Pt is also hypertensive at 179/97. Alert, oriented and MAE x4. She is tearful and actively vomiting.

## 2014-08-27 ENCOUNTER — Encounter (HOSPITAL_COMMUNITY): Payer: Self-pay

## 2014-08-27 ENCOUNTER — Emergency Department (HOSPITAL_COMMUNITY)
Admission: EM | Admit: 2014-08-27 | Discharge: 2014-08-27 | Disposition: A | Discharge status: Home / Self Care | Payer: No Typology Code available for payment source | Attending: Emergency Medicine | Admitting: Emergency Medicine

## 2014-08-27 DIAGNOSIS — Z720 Tobacco use: Secondary | ICD-10-CM | POA: Diagnosis not present

## 2014-08-27 DIAGNOSIS — Z8639 Personal history of other endocrine, nutritional and metabolic disease: Secondary | ICD-10-CM | POA: Diagnosis not present

## 2014-08-27 DIAGNOSIS — Z79899 Other long term (current) drug therapy: Secondary | ICD-10-CM | POA: Insufficient documentation

## 2014-08-27 DIAGNOSIS — Z792 Long term (current) use of antibiotics: Secondary | ICD-10-CM | POA: Diagnosis not present

## 2014-08-27 DIAGNOSIS — Z791 Long term (current) use of non-steroidal anti-inflammatories (NSAID): Secondary | ICD-10-CM | POA: Insufficient documentation

## 2014-08-27 DIAGNOSIS — R51 Headache: Secondary | ICD-10-CM

## 2014-08-27 DIAGNOSIS — Z8719 Personal history of other diseases of the digestive system: Secondary | ICD-10-CM | POA: Insufficient documentation

## 2014-08-27 DIAGNOSIS — Z91040 Latex allergy status: Secondary | ICD-10-CM | POA: Insufficient documentation

## 2014-08-27 DIAGNOSIS — I10 Essential (primary) hypertension: Secondary | ICD-10-CM | POA: Insufficient documentation

## 2014-08-27 DIAGNOSIS — Z862 Personal history of diseases of the blood and blood-forming organs and certain disorders involving the immune mechanism: Secondary | ICD-10-CM | POA: Diagnosis not present

## 2014-08-27 DIAGNOSIS — G8929 Other chronic pain: Secondary | ICD-10-CM | POA: Diagnosis not present

## 2014-08-27 DIAGNOSIS — R519 Headache, unspecified: Secondary | ICD-10-CM

## 2014-08-27 DIAGNOSIS — G43909 Migraine, unspecified, not intractable, without status migrainosus: Secondary | ICD-10-CM | POA: Insufficient documentation

## 2014-08-27 DIAGNOSIS — Z7952 Long term (current) use of systemic steroids: Secondary | ICD-10-CM | POA: Diagnosis not present

## 2014-08-27 MED ORDER — ONDANSETRON 4 MG PO TBDP: 4.0000 mg | ORAL_TABLET | Freq: Three times a day (TID) | ORAL | Status: DC | PRN

## 2014-08-27 MED ORDER — KETOROLAC TROMETHAMINE 60 MG/2ML IM SOLN
60.0000 mg | Freq: Once | INTRAMUSCULAR | Status: AC
  Administered 2014-08-27: 60 mg via INTRAMUSCULAR
  Filled 2014-08-27: qty 2

## 2014-08-27 MED ORDER — DIAZEPAM 5 MG/ML IJ SOLN
5.0000 mg | Freq: Once | INTRAMUSCULAR | Status: AC
  Administered 2014-08-27: 5 mg via INTRAMUSCULAR
  Filled 2014-08-27: qty 2

## 2014-08-27 MED ORDER — DIAZEPAM 5 MG PO TABS: 5.0000 mg | ORAL_TABLET | Freq: Two times a day (BID) | ORAL | Status: DC

## 2014-08-27 MED ORDER — KETOROLAC TROMETHAMINE 10 MG PO TABS: 10.0000 mg | ORAL_TABLET | Freq: Four times a day (QID) | ORAL | Status: DC | PRN

## 2014-08-27 MED ORDER — DIAZEPAM 5 MG/ML IJ SOLN: 5.0000 mg | Freq: Once | INTRAMUSCULAR | Status: DC

## 2014-08-27 NOTE — Discharge Instructions (Signed)
Take the prescribed medication as directed. Follow-up with neurology-- call to schedule appt. Return to the ED for new or worsening symptoms.

## 2014-08-27 NOTE — ED Notes (Signed)
Pt reports intermittent "sharp pains" in the back, right side of head, stating, "its not a headache." pain is intermittent and when sharp pain occurs patient screams out and covers her right ear, endorsing right ear pain. Hx of ear infection but was left ear. Neuro exam intact.

## 2014-08-27 NOTE — ED Provider Notes (Signed)
CSN: [id number]     Arrival date & time 08/27/14  4098 History   First MD Initiated Contact with Patient 08/27/14 0601     Chief Complaint  Patient presents with  . Headache     (Consider location/radiation/quality/duration/timing/severity/associated sxs/prior Treatment) The history is provided by the patient and medical records.   This is a 54 year old female with past medical history significant for hypertension, Graves' disease, migraine headaches, chronic back pain, presenting to the ED for "sharp pains" in the back of her head. Patient states this is not a headache.  She states it feels like someone is stabbing here in the back of her head and neck.  Pains are intermittent and radiate to her right ear.  She does have some noted photophobia.  Denies dizziness, lightheadedness, tinnitus, changes in speech, visual disturbance, confusion, or ataxia.  No fever, chills, or neck stiffness.  Patient was recently treated for an ear infection, states she finished all her antibiotics.  Patient has been taking amitriptyline at night with relief, but states she cannot take this during the day because it causes drowsiness. She has not tried any other medications for day time.   Patient is not currently on any type of anti-coagulation.  No prior hx of TIA or stroke.  Patient not followed by neurology for migraines.  VSS on arrival.  Past Medical History  Diagnosis Date  . Hypertension   . Anemia   . Neck strain 02/06/2012  . Graves' disease   . Positive TB test   . H/O hiatal hernia   . Migraine headache     "couple times/month" (02/12/2013)  . Chronic lower back pain    Past Surgical History  Procedure Laterality Date  . Cesarean section  1979 1986 1990  . Appendectomy  2006  . Abdominal hysterectomy  2006   Family History  Problem Relation Age of Onset  . Heart disease Other   . Diabetes Other   . Heart disease Mother     CAB age 24  . Diabetes Mother   . Cancer Father 35    colon cancer   . Stroke Father   . Diabetes Sister   . Kidney disease Sister     renal failure  . Diabetes Brother   . Kidney disease Brother     renal failure   History  Substance Use Topics  . Smoking status: Current Every Day Smoker -- 0.25 packs/day for 30 years    Types: Cigarettes  . Smokeless tobacco: Never Used  . Alcohol Use: No   OB History    No data available     Review of Systems  Neurological: Positive for headaches.  All other systems reviewed and are negative.     Allergies  Clonidine derivatives; Aspirin; and Latex  Home Medications   Prior to Admission medications   Medication Sig Start Date End Date Taking? Authorizing Provider  amLODipine (NORVASC) 10 MG tablet Take 1 tablet (10 mg total) by mouth at bedtime. 01/28/14 01/28/15 Yes [name], MD  diphenhydrAMINE (BENADRYL) 25 MG tablet Take 1 tablet (25 mg total) by mouth every 6 (six) hours as needed for sleep (with reglan for headaches). 08/07/14  Yes Mercedes Strupp Camprubi-Soms, PA-C  fluticasone (FLONASE) 50 MCG/ACT nasal spray Place 2 sprays into both nostrils daily. 08/06/14  Yes Hilton Sinclair, MD  Hypromellose (ARTIFICIAL TEARS OP) Place 1 drop into both eyes as needed (for dry eyes).   Yes Historical Provider, MD  levothyroxine (SYNTHROID, LEVOTHROID) 100  MCG tablet Take 100 mcg by mouth daily before breakfast.   Yes Historical Provider, MD  lisinopril-hydrochlorothiazide (PRINZIDE,ZESTORETIC) 20-25 MG per tablet Take 1 tablet by mouth daily. 03/17/14  Yes [name], MD  loratadine (CLARITIN) 10 MG tablet Take 10 mg by mouth daily.   Yes Historical Provider, MD  metoCLOPramide (REGLAN) 10 MG tablet Take 1 tablet (10 mg total) by mouth every 6 (six) hours as needed for nausea (nausea/headache). 08/07/14  Yes Mercedes Strupp Camprubi-Soms, PA-C  metoprolol tartrate (LOPRESSOR) 25 MG tablet Take 12.5 mg by mouth 2 (two) times daily.   Yes Historical Provider, MD  Multiple Vitamin (MULTIVITAMIN WITH  MINERALS) TABS tablet Take 1 tablet by mouth daily.   Yes Historical Provider, MD  naproxen (NAPROSYN) 500 MG tablet Take 1 tablet (500 mg total) by mouth 2 (two) times daily as needed for mild pain, moderate pain or headache (TAKE WITH MEALS.). 08/07/14  Yes Mercedes Strupp Camprubi-Soms, PA-C  spironolactone (ALDACTONE) 50 MG tablet Take 1 tablet (50 mg total) by mouth daily. 07/04/14  Yes [name], MD  amoxicillin-clavulanate (AUGMENTIN) 875-125 MG per tablet Take 1 tablet by mouth 2 (two) times daily. Patient not taking: Reported on 08/27/2014 08/06/14   Hilton Sinclair, MD  cyclobenzaprine (FLEXERIL) 5 MG tablet Take 1 tablet (5 mg total) by mouth 3 (three) times daily as needed for muscle spasms. Patient not taking: Reported on 08/27/2014 06/02/14   Merla Riches, MD  doxycycline (VIBRAMYCIN) 100 MG capsule Take 1 capsule (100 mg total) by mouth 2 (two) times daily. One po bid x 7 days. START ONLY IF YOU STOP TAKING AUGMENTIN DUE TO SIDE EFFECTS Patient not taking: Reported on 08/27/2014 08/07/14   Patty Sermons Camprubi-Soms, PA-C  naproxen (NAPROSYN) 500 MG tablet Take 1 tablet (500 mg total) by mouth 2 (two) times daily with a meal. Patient not taking: Reported on 08/27/2014 06/02/14   Merla Riches, MD  oxyCODONE-acetaminophen (PERCOCET/ROXICET) 5-325 MG per tablet Take 2 tablets by mouth every 6 (six) hours as needed for severe pain. Patient not taking: Reported on 08/27/2014 05/22/14   Montine Circle, PA-C   BP 134/78 mmHg  Pulse 77  Temp(Src) 97.5 F (36.4 C) (Oral)  Resp 16  Ht 5\' 5"  (1.651 m)  Wt 188 lb (85.276 kg)  BMI 31.28 kg/m2  SpO2 99%   Physical Exam  Constitutional: She is oriented to person, place, and time. She appears well-developed and well-nourished. No distress.  HENT:  Head: Normocephalic and atraumatic.  Right Ear: Tympanic membrane and ear canal normal.  Left Ear: Tympanic membrane and ear canal normal.  Mouth/Throat: Uvula is midline, oropharynx  is clear and moist and mucous membranes are normal.  TM's normal bilaterally; mastoid's non-tender, no swelling noted  Eyes: Conjunctivae and EOM are normal. Pupils are equal, round, and reactive to light.  Neck: Trachea normal, normal range of motion, full passive range of motion without pain and phonation normal. Neck supple. Muscular tenderness present. No spinous process tenderness present. No rigidity.  Muscular tenderness with spasm along cervical paraspinal muscles, R > L; no midline tenderness or deformities; full ROM; no rigidity  Cardiovascular: Normal rate, regular rhythm and normal heart sounds.   Pulmonary/Chest: Effort normal and breath sounds normal. No respiratory distress. She has no wheezes.  Abdominal: Soft. Bowel sounds are normal. There is no tenderness. There is no guarding.  Musculoskeletal: Normal range of motion.  Neurological: She is alert and oriented to person, place, and time.  AAOx3, answering  questions and following commands appropriately; equal strength UE and LE bilaterally; CN grossly intact; moves all extremities appropriately without ataxia; no focal neuro deficits or facial asymmetry appreciated  Skin: Skin is warm and dry. She is not diaphoretic.  Psychiatric: She has a normal mood and affect.  Nursing note and vitals reviewed.   ED Course  Procedures (including critical care time) Labs Review Labs Reviewed - No data to display  Imaging Review No results found.   EKG Interpretation None      MDM   Final diagnoses:  Headache, unspecified headache type   54 year old female with sharp, stabbing pains in the back of her head and in her neck which radiate to her right ear. On exam, patient is afebrile and nontoxic in appearance. HEENT exam WNL, no mastoid tenderness.  She does have noted tenderness with spasm of her cervical paraspinal muscles, but maintains full range of motion of her neck without rigidity. When sharp pains come she screams out.   Her neurologic exam is intact. Low suspicion for acute intracranial pathology at this time.  Suspect tension headache.  Will treat with Toradol and Valium.  After medication patient is resting comfortably.  She states she is feeling much better, no recurrence of pain in the past 30 minutes.  Neurologic exam remains non-focal without clinical signs of meningitis.  Patient will be d/c home with toradol and valium.  She was given neurology follow-up, encouraged to FU with PCP in the interim.  Discussed plan with patient, he/she acknowledged understanding and agreed with plan of care.  Return precautions given for new or worsening symptoms.  Larene Pickett, PA-C 21/19/41 [id number]  Delora Fuel, MD 14/48/18 5631

## 2014-09-06 ENCOUNTER — Other Ambulatory Visit: Payer: Self-pay | Admitting: Family Medicine

## 2014-10-21 ENCOUNTER — Other Ambulatory Visit: Payer: Self-pay | Admitting: Family Medicine

## 2014-11-13 ENCOUNTER — Other Ambulatory Visit: Payer: Self-pay | Admitting: Family Medicine

## 2014-11-13 DIAGNOSIS — N63 Unspecified lump in unspecified breast: Secondary | ICD-10-CM

## 2014-11-19 ENCOUNTER — Ambulatory Visit
Admission: RE | Admit: 2014-11-19 | Discharge: 2014-11-19 | Disposition: A | Discharge status: Home / Self Care | Payer: No Typology Code available for payment source | Source: Ambulatory Visit | Attending: Family Medicine | Admitting: Family Medicine

## 2014-11-19 ENCOUNTER — Other Ambulatory Visit: Payer: Self-pay | Admitting: Family Medicine

## 2014-11-19 ENCOUNTER — Telehealth: Payer: Self-pay | Admitting: Family Medicine

## 2014-11-19 DIAGNOSIS — N63 Unspecified lump in unspecified breast: Secondary | ICD-10-CM

## 2014-11-19 DIAGNOSIS — N6320 Unspecified lump in the left breast, unspecified quadrant: Secondary | ICD-10-CM

## 2014-11-19 NOTE — Telephone Encounter (Signed)
I called patient to confirm that she is aware of her mammogram report and the need for biopsy. She stated her biopsy is scheduled for 11/26/14. She also asked for throat biopsy due to past hx of some findings in her throat in the past. I suggested she follow up with me soon to further discuss this, then I can refer her to ENT for assessment. She agreed with plan.

## 2014-11-20 ENCOUNTER — Other Ambulatory Visit: Payer: Self-pay | Admitting: Family Medicine

## 2014-11-20 DIAGNOSIS — N6320 Unspecified lump in the left breast, unspecified quadrant: Secondary | ICD-10-CM

## 2014-11-26 ENCOUNTER — Other Ambulatory Visit: Payer: Self-pay | Admitting: Diagnostic Radiology

## 2014-11-26 ENCOUNTER — Ambulatory Visit
Admission: RE | Admit: 2014-11-26 | Discharge: 2014-11-26 | Disposition: A | Discharge status: Home / Self Care | Payer: No Typology Code available for payment source | Source: Ambulatory Visit | Attending: Family Medicine | Admitting: Family Medicine

## 2014-11-26 DIAGNOSIS — N6320 Unspecified lump in the left breast, unspecified quadrant: Secondary | ICD-10-CM

## 2014-11-26 HISTORY — PX: BREAST BIOPSY: SHX20

## 2015-01-05 ENCOUNTER — Ambulatory Visit (INDEPENDENT_AMBULATORY_CARE_PROVIDER_SITE_OTHER): Payer: No Typology Code available for payment source | Admitting: Family Medicine

## 2015-01-05 ENCOUNTER — Encounter: Payer: Self-pay | Admitting: Family Medicine

## 2015-01-05 VITALS — BP 150/83 | HR 63 | Temp 98.6°F | Ht 65.0 in | Wt 184.7 lb

## 2015-01-05 DIAGNOSIS — L299 Pruritus, unspecified: Secondary | ICD-10-CM | POA: Diagnosis not present

## 2015-01-05 DIAGNOSIS — L089 Local infection of the skin and subcutaneous tissue, unspecified: Secondary | ICD-10-CM

## 2015-01-05 DIAGNOSIS — W57XXXA Bitten or stung by nonvenomous insect and other nonvenomous arthropods, initial encounter: Secondary | ICD-10-CM | POA: Diagnosis not present

## 2015-01-05 DIAGNOSIS — T148 Other injury of unspecified body region: Secondary | ICD-10-CM

## 2015-01-05 MED ORDER — DOXYCYCLINE HYCLATE 100 MG PO TABS: 100.0000 mg | ORAL_TABLET | Freq: Two times a day (BID) | ORAL | Status: DC

## 2015-01-05 MED ORDER — HYDROXYZINE HCL 10 MG PO TABS: 10.0000 mg | ORAL_TABLET | Freq: Three times a day (TID) | ORAL | Status: DC | PRN

## 2015-01-05 NOTE — Assessment & Plan Note (Addendum)
Unclear etiology of 2 discrete spots (left forearm and right inner thigh) of erythema, edema (improved) and tenderness x 2 days. Clinically appear may be localized cellulitis vs inflammatory reaction to bug bite, not consistent with target lesion or erythema migrans. No appreciable bite or erythema on face. Given significant h/o within 1-2 years of cellulitis from bug bite and tick exposure, concern infected insect / spider bites. - Afebrile, no abscess identified or purulence.  Plan: 1. Trial on Doxycycline 100mg  PO BID x 10 days, cover tick exposure and possible skin cellulitis 2. Add Hydroxyzine PO PRN itching, may try topical therapies PRN 3. RTC 1 week for re-evaluation, may need to switch coverage if more consistent with cellulitis

## 2015-01-05 NOTE — Patient Instructions (Signed)
Dear Teresa [name], Thank you for coming in to clinic today.  1. I think that this is a skin infection / allergic reaction to a bug bite. May be mosquito or may be spider, alternatively with history of ticks we will cover you for both. - Start Doxycycline 100mg  twice daily for next 10 days - Try OTC benadryl anti-itch or cortizone ointment as needed for symptoms - Given rx Hydroxyzine if worsening and need to try pill, can  Make you sleepy  Please schedule a follow-up appointment with Dr. Gwendlyn Deutscher in 1 week to follow-up see if improving.  If you have any other questions or concerns, please feel free to call the clinic to contact me. You may also schedule an earlier appointment if necessary.  However, if your symptoms get significantly worse, please go to the Emergency Department to seek immediate medical attention.  Nobie Putnam, [place]

## 2015-01-05 NOTE — Progress Notes (Signed)
   Subjective:    Patient ID: Teresa [name], female    DOB: 1960-10-28, 54 y.o.   MRN: [id number]  Patient presents for a same day appointment.  HPI  MULTIPLE BUG BITES: - Today complains of several bug bites experienced over weekend. Describes itching, pain, burning sensation in multiple skin spots (left forearm, right mid thigh, right face, left small toe). States she woke up Saturday morning with irritation in these locations. Admits to similar reaction 1-2 years ago with hospitalization for bug bite ("spider") and cellulitis, additionally had tick bite / exposure a different time, required Doxycycline therapy. - Currently living by herself and no one else with any other symptoms, sister at home without other symptoms. Recently outside but no known identified bugs, spiders, or ticks. - Admits to some swelling associated with each bite site with improvement now, but still redness. - Denies fevers/chills, abdominal pain, nausea / vomiting, joint pain or swelling, extensive spreading rash  I have reviewed and updated the following as appropriate: allergies and current medications  Social Hx: - Active smoker  Review of Systems  See above HPI    Objective:   Physical Exam  BP 150/83 mmHg  Pulse 63  Temp(Src) 98.6 F (37 C) (Oral)  Ht 5\' 5"  (1.651 m)  Wt 184 lb 11.2 oz (83.779 kg)  BMI 30.74 kg/m2  Gen - well-appearing, comfortable and cooperative, NAD HEENT - NCAT mild discomfort with +TTP right maxillary region without appreciable erythema or edema, PERRL, EOMI, patent nares w/o congestion, oropharynx clear, MMM Neck - supple, non-tender, no LAD Lungs - CTAB, no wheezing, crackles, or rhonchi. Normal work of breathing. Ext - non-tender, no edema, peripheral pulses intact +2 b/l Skin - warm, dry. Scattered multiple wide erythematous patches without discrete borders or pattern localized to left forearm and right inner thigh without evidence of tick or bug bite, no induration,  pustule, or drainage. Mild +TTP. No warmth or extending rash. Left 5th toe with some dark discoloration and lateral corn / callus formation. No edema or erythema, non-tender. Neuro - awake, alert, oriented, grossly non-focal, intact muscle strength 5/5 b/l grip and ankle dorsiflexion, intact distal sensation to light touch, gait normal    Assessment & Plan:   See specific A&P problem list for details.

## 2015-02-12 ENCOUNTER — Ambulatory Visit
Admission: RE | Admit: 2015-02-12 | Discharge: 2015-02-12 | Disposition: A | Discharge status: Home / Self Care | Payer: No Typology Code available for payment source | Source: Ambulatory Visit | Attending: Family Medicine | Admitting: Family Medicine

## 2015-02-12 ENCOUNTER — Encounter: Payer: Self-pay | Admitting: Family Medicine

## 2015-02-12 ENCOUNTER — Ambulatory Visit (INDEPENDENT_AMBULATORY_CARE_PROVIDER_SITE_OTHER): Payer: No Typology Code available for payment source | Admitting: Family Medicine

## 2015-02-12 VITALS — BP 164/89 | HR 61 | Temp 98.5°F | Ht 65.0 in | Wt 187.5 lb

## 2015-02-12 DIAGNOSIS — M79675 Pain in left toe(s): Secondary | ICD-10-CM

## 2015-02-12 DIAGNOSIS — T148 Other injury of unspecified body region: Secondary | ICD-10-CM

## 2015-02-12 DIAGNOSIS — L089 Local infection of the skin and subcutaneous tissue, unspecified: Secondary | ICD-10-CM | POA: Diagnosis not present

## 2015-02-12 DIAGNOSIS — M25551 Pain in right hip: Secondary | ICD-10-CM

## 2015-02-12 DIAGNOSIS — W57XXXA Bitten or stung by nonvenomous insect and other nonvenomous arthropods, initial encounter: Secondary | ICD-10-CM | POA: Diagnosis not present

## 2015-02-12 MED ORDER — DOXYCYCLINE HYCLATE 100 MG PO TABS: 100.0000 mg | ORAL_TABLET | Freq: Two times a day (BID) | ORAL | Status: DC

## 2015-02-12 NOTE — Progress Notes (Signed)
   Subjective:    Patient ID: Teresa [name], female    DOB: 25-Nov-1960, 54 y.o.   MRN: [id number]  Seen for Same day visit for   CC: Left fifth toe pain  She reports being previously seen about one month ago for left fifth toe pain and insect bite, as well as in segment bites on other areas.  She was treated with antibodies for 10 days and the area resolved.  However, she noticed darkening the skin on the fifth toe as well as corns and ulcer developing in the last week.  Patient is on the superior aspect and radiates to the dorsum of the foot.  Denies any plantar pain or pain with ambulation, other than the pressure applied by shoes.  She denies any fevers, chills.  Denies any previous foot trauma or surgeries.  She continues to smoke.  Denies any numbness, paresthesias, or coldness in her feet.  Denies any calf cramping with ambulation.   Review of Systems   See HPI for ROS. Objective:  BP 164/89 mmHg  Pulse 61  Temp(Src) 98.5 F (36.9 C) (Oral)  Ht 5\' 5"  (1.651 m)  Wt 187 lb 8 oz (85.049 kg)  BMI 31.20 kg/m2  General: NAD Left foot.  Small superficial ulcer on medial aspect of the fifth toe; callus formation on lateral aspect of the toe.  Warmth and tenderness along dorsum of left foot.  Mild swelling.  Cap refill less than 2 seconds.  DP and PT pulses 2+.      Assessment & Plan:  See Problem List Documentation

## 2015-02-12 NOTE — Patient Instructions (Signed)
It was great seeing you today.   1. You currently have a infection in your left foot and fifth toe.  Take doxycycline 100 mg twice a day for 1 week.  2. I have ordered x-rays of your foot to evaluate for infection in the bone or fractures.  3. Take Aleve/ibuprofen or Tylenol as needed for pain 4. Keep the wound clean and dry with soap and water and covered with gauze.    Please bring all your medications to every doctors visit  Sign up for My Chart to have easy access to your labs results, and communication with your Primary care physician.  Next Appointment  Please make an appointment with Dr Berkley Harvey or Gwendlyn Deutscher early next week for reevaluation   Make appointment with Dr Valentina Lucks for smoking cessation  I look forward to talking with you again at our next visit. If you have any questions or concerns before then, please call the clinic at [phone].  Take Care,   Dr Phill Myron

## 2015-02-12 NOTE — Assessment & Plan Note (Signed)
Fifth toe ulcer and tenderness, warmth of dorsum of foot concerning for cellulitis.  Likely exacerbated to foot wear excessive pressure.  No systemic symptoms.  No indications of avascular necrosis or poor circulation; Although she is a smoker - Doxycycline 100 mg twice a day 1 week - X-rays ordered; low suspicion for avascular necrosis or osteomyelitis - Note provided to allow her to wear supportive tennis shoes to work - Advised keeping the wound clean, dry, and following up in clinic early next week for reassessment

## 2015-02-13 ENCOUNTER — Telehealth: Payer: Self-pay | Admitting: Family Medicine

## 2015-02-13 NOTE — Telephone Encounter (Signed)
Will forward to Dr. Berkley Harvey for results of recent xray. Neita Landrigan,CMA

## 2015-02-16 ENCOUNTER — Encounter (HOSPITAL_COMMUNITY): Payer: Self-pay | Admitting: *Deleted

## 2015-02-16 ENCOUNTER — Emergency Department (HOSPITAL_COMMUNITY)
Admission: EM | Admit: 2015-02-16 | Discharge: 2015-02-16 | Disposition: A | Discharge status: Home / Self Care | Payer: No Typology Code available for payment source | Attending: Emergency Medicine | Admitting: Emergency Medicine

## 2015-02-16 DIAGNOSIS — M79672 Pain in left foot: Secondary | ICD-10-CM | POA: Diagnosis present

## 2015-02-16 DIAGNOSIS — Z8639 Personal history of other endocrine, nutritional and metabolic disease: Secondary | ICD-10-CM | POA: Insufficient documentation

## 2015-02-16 DIAGNOSIS — Z87828 Personal history of other (healed) physical injury and trauma: Secondary | ICD-10-CM | POA: Insufficient documentation

## 2015-02-16 DIAGNOSIS — I10 Essential (primary) hypertension: Secondary | ICD-10-CM | POA: Diagnosis not present

## 2015-02-16 DIAGNOSIS — L03032 Cellulitis of left toe: Secondary | ICD-10-CM | POA: Diagnosis not present

## 2015-02-16 DIAGNOSIS — Z91040 Latex allergy status: Secondary | ICD-10-CM | POA: Insufficient documentation

## 2015-02-16 DIAGNOSIS — Z792 Long term (current) use of antibiotics: Secondary | ICD-10-CM | POA: Insufficient documentation

## 2015-02-16 DIAGNOSIS — Z7951 Long term (current) use of inhaled steroids: Secondary | ICD-10-CM | POA: Insufficient documentation

## 2015-02-16 DIAGNOSIS — Z862 Personal history of diseases of the blood and blood-forming organs and certain disorders involving the immune mechanism: Secondary | ICD-10-CM | POA: Insufficient documentation

## 2015-02-16 DIAGNOSIS — Z79899 Other long term (current) drug therapy: Secondary | ICD-10-CM | POA: Diagnosis not present

## 2015-02-16 DIAGNOSIS — G8929 Other chronic pain: Secondary | ICD-10-CM | POA: Insufficient documentation

## 2015-02-16 DIAGNOSIS — Z720 Tobacco use: Secondary | ICD-10-CM | POA: Insufficient documentation

## 2015-02-16 MED ORDER — HYDROCODONE-ACETAMINOPHEN 5-325 MG PO TABS: 1.0000 | ORAL_TABLET | Freq: Four times a day (QID) | ORAL | Status: DC | PRN

## 2015-02-16 MED ORDER — LIDOCAINE HCL (PF) 1 % IJ SOLN
5.0000 mL | Freq: Once | INTRAMUSCULAR | Status: AC
  Administered 2015-02-16: 5 mL via INTRADERMAL
  Filled 2015-02-16: qty 5

## 2015-02-16 MED ORDER — SULFAMETHOXAZOLE-TRIMETHOPRIM 800-160 MG PO TABS: 1.0000 | ORAL_TABLET | Freq: Two times a day (BID) | ORAL | Status: AC

## 2015-02-16 MED ORDER — OXYCODONE-ACETAMINOPHEN 5-325 MG PO TABS
2.0000 | ORAL_TABLET | Freq: Once | ORAL | Status: AC
  Administered 2015-02-16: 2 via ORAL
  Filled 2015-02-16: qty 2

## 2015-02-16 NOTE — ED Notes (Signed)
MD at the bedside  

## 2015-02-16 NOTE — ED Notes (Signed)
Pt reports a sore to left small toe for 1 month. Pt states that she thinks that she was bit by something. Pt has been on antibiotics for this already. Pt reports pain that has continued. Pt has PMS intact. Denies hx of diabetes.

## 2015-02-16 NOTE — ED Provider Notes (Signed)
CSN: [id number]     Arrival date & time 02/16/15  1056 History   First MD Initiated Contact with Patient 02/16/15 1518     Chief Complaint  Patient presents with  . Foot Pain     (Consider location/radiation/quality/duration/timing/severity/associated sxs/prior Treatment) Patient is a 54 y.o. female presenting with toe pain.  Toe Pain This is a new problem. The current episode started more than 1 week ago. The problem occurs constantly. The problem has not changed since onset.Pertinent negatives include no chest pain, no headaches and no shortness of breath. Nothing aggravates the symptoms. Nothing relieves the symptoms. She has tried nothing for the symptoms. The treatment provided no relief.    Past Medical History  Diagnosis Date  . Hypertension   . Anemia   . Neck strain 02/06/2012  . Graves' disease   . Positive TB test   . H/O hiatal hernia   . Migraine headache     "couple times/month" (02/12/2013)  . Chronic lower back pain    Past Surgical History  Procedure Laterality Date  . Cesarean section  1979 1986 1990  . Appendectomy  2006  . Abdominal hysterectomy  2006   Family History  Problem Relation Age of Onset  . Heart disease Other   . Diabetes Other   . Heart disease Mother     CAB age 57  . Diabetes Mother   . Cancer Father 67    colon cancer  . Stroke Father   . Diabetes Sister   . Kidney disease Sister     renal failure  . Diabetes Brother   . Kidney disease Brother     renal failure   Social History  Substance Use Topics  . Smoking status: Current Every Day Smoker -- 0.25 packs/day for 30 years    Types: Cigarettes  . Smokeless tobacco: Never Used  . Alcohol Use: No   OB History    No data available     Review of Systems  Respiratory: Negative for shortness of breath.   Cardiovascular: Negative for chest pain.  Skin: Positive for rash.  Neurological: Negative for headaches.      Allergies  Clonidine derivatives; Aspirin; and  Latex  Home Medications   Prior to Admission medications   Medication Sig Start Date End Date Taking? Authorizing Provider  amLODipine (NORVASC) 10 MG tablet TAKE ONE TABLET BY MOUTH AT BEDTIME 09/08/14   Kinnie Feil, MD  diphenhydrAMINE (BENADRYL) 25 MG tablet Take 1 tablet (25 mg total) by mouth every 6 (six) hours as needed for sleep (with reglan for headaches). 08/07/14   Mercedes Camprubi-Soms, PA-C  doxycycline (VIBRA-TABS) 100 MG tablet Take 1 tablet (100 mg total) by mouth 2 (two) times daily. 02/12/15   Olam Idler, MD  fluticasone (FLONASE) 50 MCG/ACT nasal spray Place 2 sprays into both nostrils daily. 08/06/14   Hilton Sinclair, MD  HYDROcodone-acetaminophen (NORCO/VICODIN) 5-325 MG per tablet Take 1 tablet by mouth every 6 (six) hours as needed for moderate pain. 02/16/15   Merrily Pew, MD  hydrOXYzine (ATARAX/VISTARIL) 10 MG tablet Take 1 tablet (10 mg total) by mouth 3 (three) times daily as needed. 01/05/15   Olin Hauser, DO  Hypromellose (ARTIFICIAL TEARS OP) Place 1 drop into both eyes as needed (for dry eyes).    Historical Provider, MD  levothyroxine (SYNTHROID, LEVOTHROID) 100 MCG tablet TAKE ONE TABLET BY MOUTH IN THE MORNING 10/22/14   Kinnie Feil, MD  lisinopril-hydrochlorothiazide (PRINZIDE,ZESTORETIC) 20-25 MG per  tablet TAKE ONE TABLET BY MOUTH ONCE DAILY 09/08/14   Kinnie Feil, MD  loratadine (CLARITIN) 10 MG tablet Take 10 mg by mouth daily.    Historical Provider, MD  metoCLOPramide (REGLAN) 10 MG tablet Take 1 tablet (10 mg total) by mouth every 6 (six) hours as needed for nausea (nausea/headache). 08/07/14   Mercedes Camprubi-Soms, PA-C  metoprolol tartrate (LOPRESSOR) 25 MG tablet TAKE ONE-HALF TABLET BY MOUTH TWICE DAILY 09/08/14   Kinnie Feil, MD  Multiple Vitamin (MULTIVITAMIN WITH MINERALS) TABS tablet Take 1 tablet by mouth daily.    Historical Provider, MD  spironolactone (ALDACTONE) 50 MG tablet Take 1 tablet (50 mg total) by  mouth daily. 07/04/14   Kinnie Feil, MD  sulfamethoxazole-trimethoprim (BACTRIM DS,SEPTRA DS) 800-160 MG per tablet Take 1 tablet by mouth 2 (two) times daily. 02/16/15 02/21/15  Merrily Pew, MD   BP 179/84 mmHg  Pulse 65  Temp(Src) 97.9 F (36.6 C) (Oral)  Resp 18  Ht 5\' 5"  (1.651 m)  Wt 187 lb (84.823 kg)  BMI 31.12 kg/m2  SpO2 98% Physical Exam  Constitutional: She is oriented to person, place, and time. She appears well-developed and well-nourished.  HENT:  Head: Normocephalic and atraumatic.  Eyes: Conjunctivae and EOM are normal. Right eye exhibits no discharge. Left eye exhibits no discharge.  Cardiovascular: Normal rate and regular rhythm.   Pulmonary/Chest: Effort normal and breath sounds normal. No respiratory distress.  Abdominal: Soft. She exhibits no distension. There is no tenderness. There is no rebound.  Musculoskeletal: Normal range of motion. She exhibits tenderness (fifth MTP with swelling, ttp, discoloration). She exhibits no edema.  Neurological: She is alert and oriented to person, place, and time.  Skin: Skin is warm and dry.  Nursing note and vitals reviewed.   ED Course  INCISION AND DRAINAGE Date/Time: 02/16/2015 4:27 PM Performed by: Merrily Pew Authorized by: Merrily Pew Consent: Verbal consent obtained. Risks and benefits: risks, benefits and alternatives were discussed Consent given by: patient Type: abscess Body area: lower extremity Location details: left little toe Anesthesia: digital block and local infiltration Local anesthetic: lidocaine 1% without epinephrine Anesthetic total: 2 ml Scalpel size: 11 Incision type: single straight Incision depth: dermal Complexity: simple Drainage: bloody Drainage amount: scant Wound treatment: wound left open Packing material: none Patient tolerance: Patient tolerated the procedure well with no immediate complications   (including critical care time) ULTRASOUND LIMITED SOFT TISSUE: Left 5th  toe dorsal Indication: swelling and pain Linear probe used to evaluate area of interest in two planes. Findings:  Abscess and cellulitis Performed by: Dr Dayna Barker Images saved electronically   [place] - No data to display  Imaging Review No results found. I have personally reviewed and evaluated these images and lab results as part of my medical decision-making.   EKG Interpretation None      MDM   Final diagnoses:  Cellulitis of toe of left foot    54 yo F w/ persistent discoloration, pain and swelling of left small  Toe. On antibiotics already. Exam as above. [place] with small area of fluid collection so I&D performed without purulent discharge. Symptoms c/w cellulitis and hematoma. Possibly partially ingrown toenail so will continue with abxc treatment, supportive care and podiatry follow up.   I have personally and contemperaneously reviewed labs and imaging and used in my decision making as above.   A medical screening exam was performed and I feel the patient has had an appropriate workup for their  chief complaint at this time and likelihood of emergent condition existing is low. They have been counseled on decision, discharge, follow up and which symptoms necessitate immediate return to the emergency department. They or their family verbally stated understanding and agreement with plan and discharged in stable condition.      Merrily Pew, MD 02/16/15 [phone]

## 2015-02-18 ENCOUNTER — Ambulatory Visit: Payer: No Typology Code available for payment source | Admitting: Family Medicine

## 2015-02-25 ENCOUNTER — Encounter: Payer: Self-pay | Admitting: Podiatry

## 2015-02-25 ENCOUNTER — Ambulatory Visit (INDEPENDENT_AMBULATORY_CARE_PROVIDER_SITE_OTHER): Payer: No Typology Code available for payment source

## 2015-02-25 ENCOUNTER — Ambulatory Visit (INDEPENDENT_AMBULATORY_CARE_PROVIDER_SITE_OTHER): Payer: No Typology Code available for payment source | Admitting: Podiatry

## 2015-02-25 VITALS — BP 175/94 | HR 68 | Resp 16

## 2015-02-25 DIAGNOSIS — M2040 Other hammer toe(s) (acquired), unspecified foot: Secondary | ICD-10-CM

## 2015-02-25 DIAGNOSIS — M79672 Pain in left foot: Secondary | ICD-10-CM

## 2015-02-25 DIAGNOSIS — L03032 Cellulitis of left toe: Secondary | ICD-10-CM

## 2015-02-25 DIAGNOSIS — L02612 Cutaneous abscess of left foot: Secondary | ICD-10-CM

## 2015-02-25 MED ORDER — SULFAMETHOXAZOLE-TRIMETHOPRIM 800-160 MG PO TABS: 1.0000 | ORAL_TABLET | Freq: Two times a day (BID) | ORAL | Status: DC

## 2015-02-25 NOTE — Progress Notes (Signed)
   Subjective:    Patient ID: Teresa [name], female    DOB: [birth year], 54 y.o.   MRN: [id number]  HPI Pt presents with painful corn on her left 5th toe. Toe is red swollen, pt went to hospital 2 weeks ago for this problem, toe pain has improved but still hurts   Review of Systems  HENT: Positive for rhinorrhea.   Cardiovascular: Positive for leg swelling.  Allergic/Immunologic: Positive for environmental allergies.  Neurological: Positive for headaches.  All other systems reviewed and are negative.      Objective:   Physical Exam        Assessment & Plan:

## 2015-02-25 NOTE — Progress Notes (Signed)
Subjective:     Patient ID: Teresa [name], female   DOB: [birth year], 54 y.o.   MRN: [id number]  HPI patient states I been having some pain in my left fifth toe for several weeks and I don't know if I got bit by something. I went to the emergency room and they tried to open it up and it didn't work and I been on antibiotics previously which helped me for a while but it never seem to completely go away and it seems a little bit worse now   Review of Systems  All other systems reviewed and are negative.      Objective:   Physical Exam  Constitutional: She is oriented to person, place, and time.  Cardiovascular: Intact distal pulses.   Musculoskeletal: Normal range of motion.  Neurological: She is oriented to person, place, and time.  Skin: Skin is warm.  Nursing note and vitals reviewed.  neurovascular status found to be intact with muscle strength adequate range of motion within normal limits. Patient's noted to have a swollen fifth toe left that irritated with some small bumps on the medial side of the toe and keratotic lesion on the head of the phalanx which she also has on the right foot and states that she wants to get fixed long-term. Patient has not noted any drainage but some swelling and discoloration     Assessment:     Possibility for infection of the left fifth toe abscess formation with no proximal edema erythema or drainage noted currently    Plan:     H&P and x-rays reviewed with patient. Today I went ahead and I numbed the fifth toe with 60 mg Xylocaine Marcaine mixture and I carefully trim the area but was unable to find any abscess currently or area that I could culture. I did do an aggressive debridement of the area with sharp sterile instrumentation to reduce any abscess or cellulitic formation and afterwards applied sterile dressing. Placed on Septra DS and advised on elevation and soaks and reappoint again in the next 2-3 weeks or earlier if any issues should occur and  we will also discussed correction of her hammertoes at next visit

## 2015-03-05 ENCOUNTER — Emergency Department (HOSPITAL_COMMUNITY): Payer: No Typology Code available for payment source

## 2015-03-05 ENCOUNTER — Encounter (HOSPITAL_COMMUNITY)
Admission: EM | Disposition: A | Discharge status: Home / Self Care | Payer: Self-pay | Source: Home / Self Care | Attending: Family Medicine

## 2015-03-05 ENCOUNTER — Encounter (HOSPITAL_COMMUNITY): Payer: Self-pay | Admitting: Emergency Medicine

## 2015-03-05 ENCOUNTER — Inpatient Hospital Stay (HOSPITAL_COMMUNITY): Payer: No Typology Code available for payment source | Admitting: Anesthesiology

## 2015-03-05 ENCOUNTER — Inpatient Hospital Stay (HOSPITAL_COMMUNITY)
Admission: EM | Admit: 2015-03-05 | Discharge: 2015-03-07 | DRG: 504 | Disposition: A | Discharge status: Home / Self Care | Payer: No Typology Code available for payment source | Attending: Family Medicine | Admitting: Family Medicine

## 2015-03-05 DIAGNOSIS — I10 Essential (primary) hypertension: Secondary | ICD-10-CM | POA: Diagnosis not present

## 2015-03-05 DIAGNOSIS — K449 Diaphragmatic hernia without obstruction or gangrene: Secondary | ICD-10-CM | POA: Diagnosis present

## 2015-03-05 DIAGNOSIS — M79675 Pain in left toe(s): Secondary | ICD-10-CM

## 2015-03-05 DIAGNOSIS — B9689 Other specified bacterial agents as the cause of diseases classified elsewhere: Secondary | ICD-10-CM | POA: Diagnosis not present

## 2015-03-05 DIAGNOSIS — M869 Osteomyelitis, unspecified: Secondary | ICD-10-CM

## 2015-03-05 DIAGNOSIS — M545 Low back pain: Secondary | ICD-10-CM | POA: Diagnosis present

## 2015-03-05 DIAGNOSIS — N179 Acute kidney failure, unspecified: Secondary | ICD-10-CM | POA: Diagnosis not present

## 2015-03-05 DIAGNOSIS — R609 Edema, unspecified: Secondary | ICD-10-CM | POA: Diagnosis present

## 2015-03-05 DIAGNOSIS — M7989 Other specified soft tissue disorders: Secondary | ICD-10-CM

## 2015-03-05 DIAGNOSIS — K219 Gastro-esophageal reflux disease without esophagitis: Secondary | ICD-10-CM | POA: Diagnosis present

## 2015-03-05 DIAGNOSIS — M868X7 Other osteomyelitis, ankle and foot: Secondary | ICD-10-CM | POA: Diagnosis not present

## 2015-03-05 DIAGNOSIS — T63301A Toxic effect of unspecified spider venom, accidental (unintentional), initial encounter: Secondary | ICD-10-CM | POA: Diagnosis present

## 2015-03-05 DIAGNOSIS — G8929 Other chronic pain: Secondary | ICD-10-CM | POA: Diagnosis present

## 2015-03-05 DIAGNOSIS — E039 Hypothyroidism, unspecified: Secondary | ICD-10-CM | POA: Diagnosis not present

## 2015-03-05 DIAGNOSIS — I1A0 Resistant hypertension: Secondary | ICD-10-CM | POA: Insufficient documentation

## 2015-03-05 DIAGNOSIS — M86272 Subacute osteomyelitis, left ankle and foot: Secondary | ICD-10-CM | POA: Diagnosis not present

## 2015-03-05 DIAGNOSIS — Z89422 Acquired absence of other left toe(s): Secondary | ICD-10-CM | POA: Diagnosis not present

## 2015-03-05 DIAGNOSIS — M25521 Pain in right elbow: Secondary | ICD-10-CM | POA: Diagnosis present

## 2015-03-05 DIAGNOSIS — F17210 Nicotine dependence, cigarettes, uncomplicated: Secondary | ICD-10-CM | POA: Diagnosis present

## 2015-03-05 HISTORY — DX: Gastro-esophageal reflux disease without esophagitis: K21.9

## 2015-03-05 HISTORY — DX: Osteomyelitis, unspecified: M86.9

## 2015-03-05 HISTORY — PX: AMPUTATION TOE: SHX6595

## 2015-03-05 HISTORY — DX: Thyrotoxicosis, unspecified without thyrotoxic crisis or storm: E05.90

## 2015-03-05 HISTORY — DX: Hypothyroidism, unspecified: E03.9

## 2015-03-05 HISTORY — PX: TOE AMPUTATION: SHX809

## 2015-03-05 LAB — CBC WITH DIFFERENTIAL/PLATELET
Basophils Absolute: 0 10*3/uL (ref 0.0–0.1)
Basophils Relative: 0 % (ref 0–1)
Eosinophils Absolute: 0.3 10*3/uL (ref 0.0–0.7)
Eosinophils Relative: 4 % (ref 0–5)
HCT: 42.7 % (ref 36.0–46.0)
Hemoglobin: 13.8 g/dL (ref 12.0–15.0)
Lymphocytes Relative: 40 % (ref 12–46)
Lymphs Abs: 3.1 10*3/uL (ref 0.7–4.0)
MCH: 26.5 pg (ref 26.0–34.0)
MCHC: 32.3 g/dL (ref 30.0–36.0)
MCV: 82 fL (ref 78.0–100.0)
Monocytes Absolute: 0.3 10*3/uL (ref 0.1–1.0)
Monocytes Relative: 4 % (ref 3–12)
Neutro Abs: 4 10*3/uL (ref 1.7–7.7)
Neutrophils Relative %: 52 % (ref 43–77)
Platelets: 282 10*3/uL (ref 150–400)
RBC: 5.21 MIL/uL — ABNORMAL HIGH (ref 3.87–5.11)
RDW: 13.9 % (ref 11.5–15.5)
WBC: 7.7 10*3/uL (ref 4.0–10.5)

## 2015-03-05 LAB — BASIC METABOLIC PANEL
Anion gap: 9 (ref 5–15)
BUN: 9 mg/dL (ref 6–20)
CO2: 23 mmol/L (ref 22–32)
Calcium: 9.4 mg/dL (ref 8.9–10.3)
Chloride: 104 mmol/L (ref 101–111)
Creatinine, Ser: 1.03 mg/dL — ABNORMAL HIGH (ref 0.44–1.00)
GFR calc Af Amer: >60 mL/min (ref >60)
GFR calc non Af Amer: >60 mL/min (ref >60)
Glucose, Bld: 97 mg/dL (ref 65–99)
Potassium: 3.8 mmol/L (ref 3.5–5.1)
Sodium: 136 mmol/L (ref 135–145)

## 2015-03-05 LAB — URIC ACID: Uric Acid, Serum: 5.1 mg/dL (ref 2.3–6.6)

## 2015-03-05 LAB — SEDIMENTATION RATE: Sed Rate: 7 mm/hr (ref 0–22)

## 2015-03-05 LAB — C-REACTIVE PROTEIN: CRP: <0.5 mg/dL (ref <1.0)

## 2015-03-05 SURGERY — AMPUTATION, TOE
Anesthesia: Choice | Site: Toe | Laterality: Left

## 2015-03-05 MED ORDER — ONDANSETRON HCL 4 MG/2ML IJ SOLN
INTRAMUSCULAR | Status: DC | PRN
  Administered 2015-03-05: 4 mg via INTRAVENOUS

## 2015-03-05 MED ORDER — SODIUM CHLORIDE 0.9 % IV SOLN: INTRAVENOUS | Status: DC

## 2015-03-05 MED ORDER — VANCOMYCIN HCL IN DEXTROSE 1-5 GM/200ML-% IV SOLN: 1000.0000 mg | Freq: Two times a day (BID) | INTRAVENOUS | Status: DC

## 2015-03-05 MED ORDER — MIDAZOLAM HCL 2 MG/2ML IJ SOLN
INTRAMUSCULAR | Status: AC
  Filled 2015-03-05: qty 2

## 2015-03-05 MED ORDER — GADOBENATE DIMEGLUMINE 529 MG/ML IV SOLN
20.0000 mL | Freq: Once | INTRAVENOUS | Status: AC
  Administered 2015-03-05: 18 mL via INTRAVENOUS

## 2015-03-05 MED ORDER — METOPROLOL TARTRATE 12.5 MG HALF TABLET
12.5000 mg | ORAL_TABLET | Freq: Two times a day (BID) | ORAL | Status: DC
  Administered 2015-03-05 – 2015-03-07 (×4): 12.5 mg via ORAL
  Filled 2015-03-05 (×4): qty 1

## 2015-03-05 MED ORDER — SENNOSIDES-DOCUSATE SODIUM 8.6-50 MG PO TABS
1.0000 | ORAL_TABLET | Freq: Every evening | ORAL | Status: DC | PRN
Start: 2015-03-05 — End: 2015-03-07

## 2015-03-05 MED ORDER — ACETAMINOPHEN 325 MG PO TABS: 650.0000 mg | ORAL_TABLET | Freq: Four times a day (QID) | ORAL | Status: DC | PRN

## 2015-03-05 MED ORDER — MORPHINE SULFATE (PF) 2 MG/ML IV SOLN
1.0000 mg | INTRAVENOUS | Status: DC | PRN
  Administered 2015-03-06 (×4): 1 mg via INTRAVENOUS
  Filled 2015-03-05 (×5): qty 1

## 2015-03-05 MED ORDER — HYDROMORPHONE HCL 1 MG/ML IJ SOLN: 0.2500 mg | INTRAMUSCULAR | Status: DC | PRN

## 2015-03-05 MED ORDER — OXYCODONE HCL 5 MG PO TABS: 5.0000 mg | ORAL_TABLET | Freq: Once | ORAL | Status: DC | PRN

## 2015-03-05 MED ORDER — AMLODIPINE BESYLATE 10 MG PO TABS
10.0000 mg | ORAL_TABLET | Freq: Every day | ORAL | Status: DC
  Administered 2015-03-05 – 2015-03-06 (×2): 10 mg via ORAL
  Filled 2015-03-05 (×2): qty 1

## 2015-03-05 MED ORDER — HYDROCHLOROTHIAZIDE 25 MG PO TABS
25.0000 mg | ORAL_TABLET | Freq: Every day | ORAL | Status: DC
  Administered 2015-03-05 – 2015-03-07 (×3): 25 mg via ORAL
  Filled 2015-03-05 (×3): qty 1

## 2015-03-05 MED ORDER — PANTOPRAZOLE SODIUM 40 MG PO TBEC
40.0000 mg | DELAYED_RELEASE_TABLET | Freq: Every day | ORAL | Status: DC
  Administered 2015-03-05 – 2015-03-07 (×3): 40 mg via ORAL
  Filled 2015-03-05 (×3): qty 1

## 2015-03-05 MED ORDER — LACTATED RINGERS IV SOLN
INTRAVENOUS | Status: DC
  Administered 2015-03-05: 13:00:00 via INTRAVENOUS

## 2015-03-05 MED ORDER — ADULT MULTIVITAMIN W/MINERALS CH
1.0000 | ORAL_TABLET | Freq: Every day | ORAL | Status: DC
  Administered 2015-03-05 – 2015-03-07 (×3): 1 via ORAL
  Filled 2015-03-05 (×3): qty 1

## 2015-03-05 MED ORDER — ENOXAPARIN SODIUM 40 MG/0.4ML ~~LOC~~ SOLN
40.0000 mg | SUBCUTANEOUS | Status: DC
  Administered 2015-03-05 – 2015-03-06 (×2): 40 mg via SUBCUTANEOUS
  Filled 2015-03-05 (×2): qty 0.4

## 2015-03-05 MED ORDER — OXYCODONE HCL 5 MG PO TABS
5.0000 mg | ORAL_TABLET | ORAL | Status: DC | PRN
  Administered 2015-03-05 – 2015-03-06 (×4): 5 mg via ORAL
  Filled 2015-03-05 (×4): qty 1

## 2015-03-05 MED ORDER — DOCUSATE SODIUM 100 MG PO CAPS
100.0000 mg | ORAL_CAPSULE | Freq: Two times a day (BID) | ORAL | Status: DC
  Administered 2015-03-05 – 2015-03-07 (×4): 100 mg via ORAL
  Filled 2015-03-05 (×4): qty 1

## 2015-03-05 MED ORDER — ONDANSETRON HCL 4 MG PO TABS: 4.0000 mg | ORAL_TABLET | Freq: Four times a day (QID) | ORAL | Status: DC | PRN

## 2015-03-05 MED ORDER — ACETAMINOPHEN 650 MG RE SUPP
650.0000 mg | Freq: Four times a day (QID) | RECTAL | Status: DC | PRN
Start: 2015-03-05 — End: 2015-03-07

## 2015-03-05 MED ORDER — MORPHINE SULFATE (PF) 4 MG/ML IV SOLN: 4.0000 mg | INTRAVENOUS | Status: DC | PRN

## 2015-03-05 MED ORDER — BUPIVACAINE-EPINEPHRINE (PF) 0.5% -1:200000 IJ SOLN
INTRAMUSCULAR | Status: AC
  Filled 2015-03-05: qty 30

## 2015-03-05 MED ORDER — 0.9 % SODIUM CHLORIDE (POUR BTL) OPTIME
TOPICAL | Status: DC | PRN
  Administered 2015-03-05: 1000 mL

## 2015-03-05 MED ORDER — MIDAZOLAM HCL 5 MG/5ML IJ SOLN
INTRAMUSCULAR | Status: DC | PRN
  Administered 2015-03-05: 2 mg via INTRAVENOUS

## 2015-03-05 MED ORDER — MEPIVACAINE HCL 1.5 % IJ SOLN
INTRAMUSCULAR | Status: DC | PRN
  Administered 2015-03-05: 20 mL via PERINEURAL

## 2015-03-05 MED ORDER — MORPHINE SULFATE (PF) 4 MG/ML IV SOLN
4.0000 mg | Freq: Once | INTRAVENOUS | Status: AC
  Administered 2015-03-05: 4 mg via INTRAVENOUS
  Filled 2015-03-05: qty 1

## 2015-03-05 MED ORDER — LISINOPRIL 20 MG PO TABS
20.0000 mg | ORAL_TABLET | Freq: Every day | ORAL | Status: DC
  Administered 2015-03-05 – 2015-03-07 (×3): 20 mg via ORAL
  Filled 2015-03-05 (×3): qty 1

## 2015-03-05 MED ORDER — VANCOMYCIN HCL 10 G IV SOLR
1500.0000 mg | Freq: Once | INTRAVENOUS | Status: DC
  Filled 2015-03-05: qty 1500

## 2015-03-05 MED ORDER — FENTANYL CITRATE (PF) 100 MCG/2ML IJ SOLN
INTRAMUSCULAR | Status: DC | PRN
  Administered 2015-03-05 (×2): 50 ug via INTRAVENOUS

## 2015-03-05 MED ORDER — VANCOMYCIN HCL IN DEXTROSE 1-5 GM/200ML-% IV SOLN
1000.0000 mg | Freq: Two times a day (BID) | INTRAVENOUS | Status: DC
  Administered 2015-03-06 – 2015-03-07 (×3): 1000 mg via INTRAVENOUS
  Filled 2015-03-05 (×5): qty 200

## 2015-03-05 MED ORDER — PROPOFOL 10 MG/ML IV BOLUS
INTRAVENOUS | Status: AC
  Filled 2015-03-05: qty 20

## 2015-03-05 MED ORDER — FENTANYL CITRATE (PF) 250 MCG/5ML IJ SOLN
INTRAMUSCULAR | Status: AC
  Filled 2015-03-05: qty 5

## 2015-03-05 MED ORDER — LIDOCAINE HCL (CARDIAC) 20 MG/ML IV SOLN
INTRAVENOUS | Status: DC | PRN
  Administered 2015-03-05: 50 mg via INTRAVENOUS

## 2015-03-05 MED ORDER — LIDOCAINE HCL (CARDIAC) 20 MG/ML IV SOLN
INTRAVENOUS | Status: AC
  Filled 2015-03-05: qty 5

## 2015-03-05 MED ORDER — ONDANSETRON HCL 4 MG/2ML IJ SOLN
4.0000 mg | Freq: Four times a day (QID) | INTRAMUSCULAR | Status: DC | PRN
  Administered 2015-03-06: 4 mg via INTRAVENOUS
  Filled 2015-03-05: qty 2

## 2015-03-05 MED ORDER — PROPOFOL 10 MG/ML IV BOLUS
INTRAVENOUS | Status: DC | PRN
  Administered 2015-03-05: 150 mg via INTRAVENOUS

## 2015-03-05 MED ORDER — PROPOFOL INFUSION 10 MG/ML OPTIME
INTRAVENOUS | Status: DC | PRN
  Administered 2015-03-05: 100 ug/kg/min via INTRAVENOUS

## 2015-03-05 MED ORDER — ONDANSETRON HCL 4 MG/2ML IJ SOLN
4.0000 mg | Freq: Once | INTRAMUSCULAR | Status: AC
  Administered 2015-03-05: 4 mg via INTRAVENOUS
  Filled 2015-03-05: qty 2

## 2015-03-05 MED ORDER — OXYCODONE HCL 5 MG/5ML PO SOLN: 5.0000 mg | Freq: Once | ORAL | Status: DC | PRN

## 2015-03-05 MED ORDER — SENNA 8.6 MG PO TABS
1.0000 | ORAL_TABLET | Freq: Two times a day (BID) | ORAL | Status: DC
  Administered 2015-03-05 – 2015-03-07 (×4): 8.6 mg via ORAL
  Filled 2015-03-05 (×4): qty 1

## 2015-03-05 MED ORDER — VANCOMYCIN HCL 1000 MG IV SOLR
1000.0000 mg | INTRAVENOUS | Status: DC | PRN
  Administered 2015-03-05: 1000 mg via INTRAVENOUS

## 2015-03-05 MED ORDER — BUPIVACAINE-EPINEPHRINE 0.5% -1:200000 IJ SOLN
INTRAMUSCULAR | Status: DC | PRN
  Administered 2015-03-05: 30 mL

## 2015-03-05 MED ORDER — PROMETHAZINE HCL 25 MG/ML IJ SOLN: 6.2500 mg | INTRAMUSCULAR | Status: DC | PRN

## 2015-03-05 MED ORDER — LISINOPRIL-HYDROCHLOROTHIAZIDE 20-25 MG PO TABS: 1.0000 | ORAL_TABLET | Freq: Every day | ORAL | Status: DC

## 2015-03-05 MED ORDER — LEVOTHYROXINE SODIUM 100 MCG PO TABS
100.0000 ug | ORAL_TABLET | Freq: Every day | ORAL | Status: DC
  Administered 2015-03-06 – 2015-03-07 (×2): 100 ug via ORAL
  Filled 2015-03-05 (×2): qty 1

## 2015-03-05 SURGICAL SUPPLY — 42 items
BANDAGE ELASTIC 4 VELCRO ST LF (GAUZE/BANDAGES/DRESSINGS) ×1 IMPLANT
BNDG CMPR 9X4 STRL LF SNTH (GAUZE/BANDAGES/DRESSINGS) ×1
BNDG COHESIVE 6X5 TAN STRL LF (GAUZE/BANDAGES/DRESSINGS) ×2 IMPLANT
BNDG CONFORM 3 STRL LF (GAUZE/BANDAGES/DRESSINGS) ×1 IMPLANT
BNDG ESMARK 4X9 LF (GAUZE/BANDAGES/DRESSINGS) ×2 IMPLANT
CANISTER SUCT 3000ML PPV (MISCELLANEOUS) ×2 IMPLANT
CHLORAPREP W/TINT 26ML (MISCELLANEOUS) ×2 IMPLANT
CUFF TOURNIQUET SINGLE 34IN LL (TOURNIQUET CUFF) IMPLANT
CUFF TOURNIQUET SINGLE 44IN (TOURNIQUET CUFF) IMPLANT
DRAPE U-SHAPE 47X51 STRL (DRAPES) ×3 IMPLANT
DRSG ADAPTIC 3X8 NADH LF (GAUZE/BANDAGES/DRESSINGS) ×2 IMPLANT
ELECT REM PT RETURN 9FT ADLT (ELECTROSURGICAL) ×2
ELECTRODE REM PT RTRN 9FT ADLT (ELECTROSURGICAL) ×1 IMPLANT
GAUZE SPONGE 4X4 12PLY STRL (GAUZE/BANDAGES/DRESSINGS) ×1 IMPLANT
GLOVE BIO SURGEON STRL SZ7 (GLOVE) ×3 IMPLANT
GLOVE BIO SURGEON STRL SZ8 (GLOVE) ×3 IMPLANT
GLOVE BIOGEL PI IND STRL 7.5 (GLOVE) ×1 IMPLANT
GLOVE BIOGEL PI IND STRL 8 (GLOVE) ×1 IMPLANT
GLOVE BIOGEL PI INDICATOR 7.5 (GLOVE) ×1
GLOVE BIOGEL PI INDICATOR 8 (GLOVE) ×1
GOWN STRL REUS W/ TWL LRG LVL3 (GOWN DISPOSABLE) ×1 IMPLANT
GOWN STRL REUS W/ TWL XL LVL3 (GOWN DISPOSABLE) ×1 IMPLANT
GOWN STRL REUS W/TWL LRG LVL3 (GOWN DISPOSABLE) ×2
GOWN STRL REUS W/TWL XL LVL3 (GOWN DISPOSABLE) ×2
KIT BASIN OR (CUSTOM PROCEDURE TRAY) ×2 IMPLANT
KIT ROOM TURNOVER OR (KITS) ×2 IMPLANT
NEEDLE 22X1 1/2 (OR ONLY) (NEEDLE) ×1 IMPLANT
NS IRRIG 1000ML POUR BTL (IV SOLUTION) ×2 IMPLANT
PACK ORTHO EXTREMITY (CUSTOM PROCEDURE TRAY) ×2 IMPLANT
PAD ARMBOARD 7.5X6 YLW CONV (MISCELLANEOUS) ×3 IMPLANT
PAD CAST 4YDX4 CTTN HI CHSV (CAST SUPPLIES) ×1 IMPLANT
PADDING CAST COTTON 4X4 STRL (CAST SUPPLIES) ×2
SPONGE GAUZE 4X4 12PLY STER LF (GAUZE/BANDAGES/DRESSINGS) ×1 IMPLANT
SPONGE LAP 18X18 X RAY DECT (DISPOSABLE) ×1 IMPLANT
SUCTION FRAZIER TIP 10 FR DISP (SUCTIONS) ×2 IMPLANT
SUT ETHILON 3 0 PS 1 (SUTURE) ×2 IMPLANT
SYR CONTROL 10ML LL (SYRINGE) ×1 IMPLANT
TOWEL OR 17X24 6PK STRL BLUE (TOWEL DISPOSABLE) ×2 IMPLANT
TOWEL OR 17X26 10 PK STRL BLUE (TOWEL DISPOSABLE) ×2 IMPLANT
TUBE CONNECTING 12X1/4 (SUCTIONS) ×2 IMPLANT
UNDERPAD 30X30 INCONTINENT (UNDERPADS AND DIAPERS) ×2 IMPLANT
WATER STERILE IRR 1000ML POUR (IV SOLUTION) ×1 IMPLANT

## 2015-03-05 NOTE — ED Provider Notes (Signed)
CSN: [id number]     Arrival date & time 03/05/15  2423 History   First MD Initiated Contact with Patient 03/05/15 0459     Chief Complaint  Patient presents with  . Toe Pain     (Consider location/radiation/quality/duration/timing/severity/associated sxs/prior Treatment) Patient is a 54 y.o. female presenting with toe pain. The history is provided by the patient.  Toe Pain  She has been having pain and swelling and discoloration of her left fifth toe for approximately the last month. Symptoms started when she had multiple insect bites on her legs. She has been seen in emergency department and by her podiatrist and has had the toe incised on 2 occasions with no improvement. She has been on several courses of anabolic with no improvement. Pain is burning and worse with movement and weightbearing. She has not been able to wear shoes because of this. She rates pain at 6/10. She has taken hydrocodone-acetaminophen or pain with only minimal relief. There has been no fever, chills, sweats. She states she just wants to know why her toe is swollen and have it fixed.  Past Medical History  Diagnosis Date  . Hypertension   . Anemia   . Neck strain 02/06/2012  . Graves' disease   . Positive TB test   . H/O hiatal hernia   . Migraine headache     "couple times/month" (02/12/2013)  . Chronic lower back pain    Past Surgical History  Procedure Laterality Date  . Cesarean section  1979 1986 1990  . Appendectomy  2006  . Abdominal hysterectomy  2006   Family History  Problem Relation Age of Onset  . Heart disease Other   . Diabetes Other   . Heart disease Mother     CAB age 80  . Diabetes Mother   . Cancer Father 2    colon cancer  . Stroke Father   . Diabetes Sister   . Kidney disease Sister     renal failure  . Diabetes Brother   . Kidney disease Brother     renal failure   Social History  Substance Use Topics  . Smoking status: Current Every Day Smoker -- 0.25 packs/day for 30  years    Types: Cigarettes  . Smokeless tobacco: Never Used  . Alcohol Use: No   OB History    No data available     Review of Systems  All other systems reviewed and are negative.     Allergies  Clonidine derivatives; Aspirin; and Latex  Home Medications   Prior to Admission medications   Medication Sig Start Date End Date Taking? Authorizing Provider  amLODipine (NORVASC) 10 MG tablet TAKE ONE TABLET BY MOUTH AT BEDTIME 09/08/14   Kinnie Feil, MD  diphenhydrAMINE (BENADRYL) 25 MG tablet Take 1 tablet (25 mg total) by mouth every 6 (six) hours as needed for sleep (with reglan for headaches). 08/07/14   Mercedes Camprubi-Soms, PA-C  doxycycline (VIBRA-TABS) 100 MG tablet Take 1 tablet (100 mg total) by mouth 2 (two) times daily. 02/12/15   Olam Idler, MD  fluticasone (FLONASE) 50 MCG/ACT nasal spray Place 2 sprays into both nostrils daily. 08/06/14   Hilton Sinclair, MD  HYDROcodone-acetaminophen (NORCO/VICODIN) 5-325 MG per tablet Take 1 tablet by mouth every 6 (six) hours as needed for moderate pain. 02/16/15   Merrily Pew, MD  hydrOXYzine (ATARAX/VISTARIL) 10 MG tablet Take 1 tablet (10 mg total) by mouth 3 (three) times daily as needed. 01/05/15  Alexander Lenna Sciara Karamalegos, DO  Hypromellose (ARTIFICIAL TEARS OP) Place 1 drop into both eyes as needed (for dry eyes).    Historical Provider, MD  levothyroxine (SYNTHROID, LEVOTHROID) 100 MCG tablet TAKE ONE TABLET BY MOUTH IN THE MORNING 10/22/14   Kinnie Feil, MD  lisinopril-hydrochlorothiazide (PRINZIDE,ZESTORETIC) 20-25 MG per tablet TAKE ONE TABLET BY MOUTH ONCE DAILY 09/08/14   Kinnie Feil, MD  loratadine (CLARITIN) 10 MG tablet Take 10 mg by mouth daily.    Historical Provider, MD  metoCLOPramide (REGLAN) 10 MG tablet Take 1 tablet (10 mg total) by mouth every 6 (six) hours as needed for nausea (nausea/headache). 08/07/14   Mercedes Camprubi-Soms, PA-C  metoprolol tartrate (LOPRESSOR) 25 MG tablet TAKE ONE-HALF  TABLET BY MOUTH TWICE DAILY 09/08/14   Kinnie Feil, MD  Multiple Vitamin (MULTIVITAMIN WITH MINERALS) TABS tablet Take 1 tablet by mouth daily.    Historical Provider, MD  spironolactone (ALDACTONE) 50 MG tablet Take 1 tablet (50 mg total) by mouth daily. 07/04/14   Kinnie Feil, MD  sulfamethoxazole-trimethoprim (BACTRIM DS,SEPTRA DS) 800-160 MG per tablet Take 1 tablet by mouth 2 (two) times daily. 02/25/15   Wallene Huh, DPM   BP 163/94 mmHg  Pulse 70  Temp(Src) 98.5 F (36.9 C) (Oral)  Resp 17  Ht 5\' 5"  (1.651 m)  Wt 187 lb (84.823 kg)  BMI 31.12 kg/m2  SpO2 100% Physical Exam  Vitals reviewed.  54 year old female, resting comfortably and in no acute distress. Vital signs are significant for hypertension. Oxygen saturation is 100%, which is normal. Head is normocephalic and atraumatic. PERRLA, EOMI. Oropharynx is clear. Neck is nontender and supple without adenopathy or JVD. Back is nontender and there is no CVA tenderness. Lungs are clear without rales, wheezes, or rhonchi. Chest is nontender. Heart has regular rate and rhythm without murmur. Abdomen is soft, flat, nontender without masses or hepatosplenomegaly and peristalsis is normoactive. Extremities have no cyanosis or edema, full range of motion is present. Left fifth toe is hyperpigmented and swollen and exquisitely tender. Tenderness extends into the midfoot and into the fourth and third toes. There is no erythema or warmth and no lymphangitic streaks are seen. Skin is warm and dry without rash. Neurologic: Mental status is normal, cranial nerves are intact, there are no motor or sensory deficits.  ED Course  Procedures (including critical care time) Labs Review Results for orders placed or performed during the hospital encounter of 87/56/43  Basic metabolic panel  Result Value Ref Range   Sodium 136 135 - 145 mmol/L   Potassium 3.8 3.5 - 5.1 mmol/L   Chloride 104 101 - 111 mmol/L   CO2 23 22 - 32 mmol/L    Glucose, Bld 97 65 - 99 mg/dL   BUN 9 6 - 20 mg/dL   Creatinine, Ser 1.03 (H) 0.44 - 1.00 mg/dL   Calcium 9.4 8.9 - 10.3 mg/dL   GFR calc non Af Amer >60 >60 mL/min   GFR calc Af Amer >60 >60 mL/min   Anion gap 9 5 - 15  CBC with Differential  Result Value Ref Range   WBC 7.7 4.0 - 10.5 K/uL   RBC 5.21 (H) 3.87 - 5.11 MIL/uL   Hemoglobin 13.8 12.0 - 15.0 g/dL   HCT 42.7 36.0 - 46.0 %   MCV 82.0 78.0 - 100.0 fL   MCH 26.5 26.0 - 34.0 pg   MCHC 32.3 30.0 - 36.0 g/dL   RDW 13.9 11.5 -  15.5 %   Platelets 282 150 - 400 K/uL   Neutrophils Relative % 52 43 - 77 %   Neutro Abs 4.0 1.7 - 7.7 K/uL   Lymphocytes Relative 40 12 - 46 %   Lymphs Abs 3.1 0.7 - 4.0 K/uL   Monocytes Relative 4 3 - 12 %   Monocytes Absolute 0.3 0.1 - 1.0 K/uL   Eosinophils Relative 4 0 - 5 %   Eosinophils Absolute 0.3 0.0 - 0.7 K/uL   Basophils Relative 0 0 - 1 %   Basophils Absolute 0.0 0.0 - 0.1 K/uL  Sedimentation rate  Result Value Ref Range   Sed Rate 7 0 - 22 mm/hr  Uric acid  Result Value Ref Range   Uric Acid, Serum 5.1 2.3 - 6.6 mg/dL   I have personally reviewed and evaluated these images as part of my medical decision-making.  MDM   Final diagnoses:  Pain and swelling of toe, left    Left toe pain and swelling of uncertain cause. Old records are reviewed and she had incision and drainage in the emergency department for suspected abscess, but only had bloody drainage. She was also seen by her podiatrist who debridement in the toe and put her on trimethoprim-sulfamethoxazole. I am not sure what is causing her pain. She has not responded to measures that should have given relief. Will check sedimentation rate, uric acid level, and will send for MRI to rule out osteomyelitis.  Laboratory workup is normal including normal WBC, normal sedimentation rate, normal uric acid. Case is signed out to Dr. Dewayne Hatch to evaluate her MRI results.  Delora Fuel, MD 41/42/39 [id number]

## 2015-03-05 NOTE — Brief Op Note (Signed)
03/05/2015  3:32 PM  PATIENT:  Teresa [name]  54 y.o. female  PRE-OPERATIVE DIAGNOSIS:  Left 5th toe osteomyelitis  POST-OPERATIVE DIAGNOSIS:  same  Procedure(s):  Left 5th toe amputation  SURGEON:  Wylene Simmer, MD  ASSISTANT: n/a  ANESTHESIA:   General, regional  EBL:  minimal   TOURNIQUET:  10 min with an ankle esmarch  COMPLICATIONS:  None apparent  DISPOSITION:  Extubated, awake and stable to recovery.  DICTATION ID:  [id number]

## 2015-03-05 NOTE — ED Notes (Signed)
Pt back from MRI 

## 2015-03-05 NOTE — Anesthesia Preprocedure Evaluation (Addendum)
Anesthesia Evaluation  Patient identified by MRN, date of birth, ID band Patient awake    Reviewed: Allergy & Precautions, NPO status , Patient's Chart, lab work & pertinent test results, reviewed documented beta blocker date and time   Airway Mallampati: II  TM Distance: >3 FB Neck ROM: Full    Dental   Pulmonary Current Smoker,    breath sounds clear to auscultation       Cardiovascular hypertension, Pt. on medications and Pt. on home beta blockers  Rhythm:Regular Rate:Normal     Neuro/Psych negative neurological ROS     GI/Hepatic Neg liver ROS, hiatal hernia, GERD  ,  Endo/Other  Hypothyroidism   Renal/GU negative Renal ROS     Musculoskeletal   Abdominal   Peds  Hematology negative hematology ROS (+)   Anesthesia Other Findings   Reproductive/Obstetrics                            Anesthesia Physical Anesthesia Plan  ASA: III  Anesthesia Plan: MAC, Regional and General   Post-op Pain Management:    Induction: Intravenous  Airway Management Planned: Simple Face Mask, Natural Airway and LMA  Additional Equipment:   Intra-op Plan:   Post-operative Plan:   Informed Consent: I have reviewed the patients History and Physical, chart, labs and discussed the procedure including the risks, benefits and alternatives for the proposed anesthesia with the patient or authorized representative who has indicated his/her understanding and acceptance.   Dental advisory given  Plan Discussed with: CRNA and Surgeon  Anesthesia Plan Comments:        Anesthesia Quick Evaluation

## 2015-03-05 NOTE — Progress Notes (Signed)
Orthopedic Tech Progress Note Patient Details:  Teresa [name] 20-Mar-1961 [id number]  Ortho Devices Type of Ortho Device: Postop shoe/boot Ortho Device/Splint Location: LLE Ortho Device/Splint Interventions: Ordered, Application   Braulio Bosch 03/05/2015, 3:59 PM

## 2015-03-05 NOTE — Anesthesia Procedure Notes (Addendum)
Anesthesia Regional Block:  Ankle block  Pre-Anesthetic Checklist: ,, timeout performed, Correct Patient, Correct Site, Correct Laterality, Correct Procedure,, site marked, risks and benefits discussed, Surgical consent, Pre-op evaluation,  At surgeon's request  Laterality: Left  Prep: chloraprep       Needles:  Injection technique: Single-shot      Needle Gauge: 25 and 25 G    Additional Needles: Ankle block Narrative:  Injection made incrementally with aspirations every 5 mL.  Performed by: Personally  Anesthesiologist: Suzette Battiest  Additional Notes: A functioning IV was confirmed and monitors were applied.  Sterile prep and drape, hand hygiene and sterile gloves were used.  Negative aspiration and test dose prior to incremental administration of local anesthetic using the 25 ga needle. 5 ports used.  The patient tolerated the procedure well.    Procedure Name: LMA Insertion Date/Time: 03/05/2015 2:58 PM Performed by: Luciana Axe K Pre-anesthesia Checklist: Patient identified, Emergency Drugs available, Suction available, Patient being monitored and Timeout performed Patient Re-evaluated:Patient Re-evaluated prior to inductionOxygen Delivery Method: Circle system utilized Preoxygenation: Pre-oxygenation with 100% oxygen Intubation Type: IV induction Ventilation: Mask ventilation without difficulty LMA: LMA inserted LMA Size: 4.0 Number of attempts: 1 Airway Equipment and Method: Stylet Placement Confirmation: positive ETCO2,  CO2 detector and breath sounds checked- equal and bilateral Tube secured with: Tape Dental Injury: Teeth and Oropharynx as per pre-operative assessment

## 2015-03-05 NOTE — ED Notes (Signed)
Dr. Roderic Palau to bedside reports for patient to be NPO. Orthopedic surgery to come and see patient.

## 2015-03-05 NOTE — ED Notes (Signed)
Pt taken to MRI  

## 2015-03-05 NOTE — Transfer of Care (Signed)
Immediate Anesthesia Transfer of Care Note  Patient: Teresa [name]  Procedure(s) Performed: Procedure(s): AMPUTATION LEFT 5TH  TOE (Left)  Patient Location: PACU  Anesthesia Type:General  Level of Consciousness: awake, oriented and patient cooperative  Airway & Oxygen Therapy: Patient Spontanous Breathing and Patient connected to nasal cannula oxygen  Post-op Assessment: Report given to RN and Post -op Vital signs reviewed and stable  Post vital signs: Reviewed  Last Vitals:  Filed Vitals:   03/05/15 1230  BP: 136/61  Pulse: 61  Temp:   Resp:     Complications: No apparent anesthesia complications

## 2015-03-05 NOTE — Progress Notes (Signed)
ANTIBIOTIC CONSULT NOTE - INITIAL  Pharmacy Consult for vancomycin Indication: osteomyelitis  Allergies  Allergen Reactions  . Clonidine Derivatives Swelling and Other (See Comments)    Facial swelling  . Aspirin Nausea Only  . Latex Rash    Patient Measurements: Height: 5\' 5"  (165.1 cm) Weight: 187 lb (84.823 kg) IBW/kg (Calculated) : 57 Adjusted Body Weight:   Vital Signs: Temp: 98.5 F (36.9 C) (09/08 0344) Temp Source: Oral (09/08 0344) BP: 136/61 mmHg (09/08 1230) Pulse Rate: 61 (09/08 1230) Intake/Output from previous day:   Intake/Output from this shift:    Labs:  Recent Labs  03/05/15 0524  WBC 7.7  HGB 13.8  PLT 282  CREATININE 1.03*   Estimated Creatinine Clearance: 68.7 mL/min (by C-G formula based on Cr of 1.03). No results for input(s): VANCOTROUGH, VANCOPEAK, VANCORANDOM, GENTTROUGH, GENTPEAK, GENTRANDOM, TOBRATROUGH, TOBRAPEAK, TOBRARND, AMIKACINPEAK, AMIKACINTROU, AMIKACIN in the last 72 hours.   Microbiology: Recent Results (from the past 720 hour(s))  Culture, blood (routine x 2)     Status: None (Preliminary result)   Collection Time: 03/05/15 12:15 PM  Result Value Ref Range Status   Specimen Description BLOOD RIGHT ANTECUBITAL  Final   Special Requests BOTTLES DRAWN AEROBIC AND ANAEROBIC 10CC  Final   Culture PENDING  Incomplete   Report Status PENDING  Incomplete  Culture, blood (routine x 2)     Status: None (Preliminary result)   Collection Time: 03/05/15 12:25 PM  Result Value Ref Range Status   Specimen Description BLOOD LEFT ANTECUBITAL  Final   Special Requests BOTTLES DRAWN AEROBIC AND ANAEROBIC 10CC  Final   Culture PENDING  Incomplete   Report Status PENDING  Incomplete    Medical History: Past Medical History  Diagnosis Date  . Hypertension   . Anemia   . Neck strain 02/06/2012  . Graves' disease   . Positive TB test   . H/O hiatal hernia   . Migraine headache     "couple times/month" (02/12/2013)  . Chronic lower  back pain     Medications:  Anti-infectives    Start     Dose/Rate Route Frequency Ordered Stop   03/06/15 0130  vancomycin (VANCOCIN) IVPB 1000 mg/200 mL premix     1,000 mg 200 mL/hr over 60 Minutes Intravenous Every 12 hours 03/05/15 1305     03/05/15 1330  vancomycin (VANCOCIN) 1,500 mg in sodium chloride 0.9 % 500 mL IVPB     1,500 mg 250 mL/hr over 120 Minutes Intravenous  Once 03/05/15 1305       Assessment: 32 yof presented to the ED with toe pain. MRI demonstrated osteomyelitis. To start IV vancomycin. Pt is afebrile and WBC is WNL.   Vanc 9/8>>  Goal of Therapy:  Vancomycin trough level 15-20 mcg/ml  Plan:  - Vancomycin 1500mg  IV x 1 then 1gm IV Q12H - F/u renal fxn, C&S, clinical status and trough at [place], Rande Lawman 03/05/2015,1:05 PM

## 2015-03-05 NOTE — ED Notes (Signed)
Pt requesting pain meds. MD aware.

## 2015-03-05 NOTE — ED Notes (Signed)
Pt in from home, reports L pinky toe pain/swelling for past 2 mo, has been on abx and pain/infection will not go away, has been to specialist.

## 2015-03-05 NOTE — H&P (Signed)
[facility] Admission History and Physical Service Pager: [phone]  Patient name: Teresa [name] Medical record number: [id number] Date of birth: 13-Sep-1960 Age: 54 y.o. Gender: female  Primary Care Provider: [name], MD Consultants: Orthopedics Code Status: Full (confirmed on admission)  Chief Complaint: Left 5th toe pain and swelling  Assessment and Plan: Teresa [name] is a 54 y.o. female presenting with Left 5th toe pain and swelling, chronic x 2 months with failed outpatient therapy for cellulitis, now with confirmed osteomyelitis on MRI. PMH is significant for HTN, H/o Grave's disease now hypothyroidism, hiatal hernia, migraines, chronic back pain.  # Osteomyelitis with ulceration, subacute to chronic, Left 5th toe Recent prolonged outpatient course of Left 5th toe cellulitis with presumed failure of outpatient antibiotics Doxycycline x 2 courses and Septra-DS (currently on, did not finish complete course), additionally had prior X-ray (negative initially for osteo), I&D in ED without purulence and surgical debridement by Podiatry in August 2016, persistent worsening pain/edema without overt systemic symptoms, pain extended to Left ankle only but without erythema, afebrile, no nausea/vomiting. Significant prior history of cellulitis following bug bites, thought that this initial trigger was insect bite, also ?ingrown toe-nail. On admit, MRI confirmed Left 5th toe osteo, WBC normal, ESR 7, no open ulceration for wound culture, no erythema to mark on skin. Vitals stable without SIRS criteria. BP normal to hypertensive range. - Admit to med-surg, inpatient, FPTS - Vitals per protocol - Added CRP, < 0.5 (negative), ESR 7 (negative) - Consulted Orthopedics for evaluation of possible operative management, bone biopsy vs amputation - Ortho recommendations to proceed to OR today for Left 5th toe amputation - Initially had ordered Vancomycin IV per pharmacy after  discussion with ED / pharm, however ideally would hold, so patient can potentially proceed with surgery first to get deep tissue culture - Pain control with Morphine IV 68m q 3 hr PRN, Zofran PRN nausea - Follow-up blood cultures - Post-op re-evaluate, may need some antibiotic coverage if considering extending cellulitis with pain to ankle, otherwise amputation may be curative. May be MRSA carrier would recommend nasal swab. Unclear why recurrent significant infections in non-DM patient. - Resume diet post-op per ortho  # HTN Currently BP stable, without any hypotension. - Continue home BP therapy with Amlodipine 122m Lisinopril-HCTZ 20-2549maily, hold spiro (unclear if actually taking)  # Hypothyroidism - Continue home Levothyroxine 100m71maily  # Hiatal Hernia - Protonix daily  FEN/GI: NPO (for surgery), SLIV Prophylaxis: Lovenox SQ daily  Disposition: Admit to inpatient for left 5th toe osteomyelitis, ortho consulted to OR 9/8 for toe amputation, may need further post-op abx depending on course, anticipate need PT/OT after amputation.  History of Present Illness:  [name] is a 53 y54. female presenting with Left Toe pain and swelling for >2 months.  Recent significant course over past 2 months for same issue with worsening Left 5th toe complications, initially seen at [facility] by me for other possibly infected insect bites (although on other extremities, not left toe), at that time afebrile and no obvious cellulitis, empirically covered with Doxycycline 100 BID x 10 days, returned on 8/18 to [facility] re-evaluation now with focused complaint of left 5th digit pain, swelling, redness, and warmth, with an ulcer formation, concerning for cellulitis, had X-ray negative for osteo but suggestive of cellulitis, started on repeat course Doxycycline 100mg19m days. Next followed up in ED 8/22 for same left 5th digit, persistent symptoms without improvement, at that time had bedside  US with  small area of fluctuance and I&D performed without significant purulence removed, referred to podiatry, saw Teresa [name] (podiatry) 8/31, given local injection xylocaine/marcaine, and superficial debridement and to do soaks regularly, started Septra-DS 800-160mg  BID x 10 days with refill.  Today returns to ED today for same complaint with Left 5th toe swelling, pain. States just does not seem to be improving despite antibiotics. Currently taking Septra-DS BID (started 2 days ago), unclear antibiotic time course as seems patient had delayed taking antibiotics based on rx date. No new symptoms. Denies any significant erythema extending from toe, pain does radiation to Left foot and ankle but not higher, other toes non-tender, no drainage of pus or bleeding. No new injury. Thinks initially due to bug/spider bite. Denies any CP, SOB, fevers, nausea, vomiting, diarrhea.  Review Of Systems: Per HPI with the following additions: none Otherwise 12 point review of systems was performed and was unremarkable.  Patient Active Problem List   Diagnosis Date Noted  . Pain in toe of left foot 02/12/2015  . Infected insect bite 01/05/2015  . Acute frontal sinusitis 08/06/2014  . Right shoulder pain 04/15/2014  . Right elbow pain 04/15/2014  . Obesity 04/15/2014  . Health care maintenance 04/15/2014  . Right hip pain 02/11/2014  . Neck pain 02/11/2014  . Abdominal pain, chronic, epigastric 02/11/2014  . Chest pain 01/27/2014  . Left knee pain 08/06/2013  . Lateral knee pain 07/19/2013  . Allergic rhinitis 07/19/2013  . Thyroid nodule 02/12/2013  . GERD (gastroesophageal reflux disease) 02/06/2012  . Tobacco abuse 09/21/2011  . Hypertension 09/21/2011  . Hypothyroidism 09/21/2011  . Throat pain 09/21/2011   Past Medical History: Past Medical History  Diagnosis Date  . Hypertension   . Anemia   . Neck strain 02/06/2012  . Graves' disease   . Positive TB test   . H/O hiatal hernia   . Migraine headache      "couple times/month" (02/12/2013)  . Chronic lower back pain    Past Surgical History: Past Surgical History  Procedure Laterality Date  . Cesarean section  1979 1986 1990  . Appendectomy  2006  . Abdominal hysterectomy  2006   Social History: Social History  Substance Use Topics  . Smoking status: Current Every Day Smoker -- 0.25 packs/day for 30 years    Types: Cigarettes  . Smokeless tobacco: Never Used  . Alcohol Use: No   Additional social history: none  Please also refer to relevant sections of EMR.  Family History: Family History  Problem Relation Age of Onset  . Heart disease Other   . Diabetes Other   . Heart disease Mother     CAB age 54  . Diabetes Mother   . Cancer Father 70    colon cancer  . Stroke Father   . Diabetes Sister   . Kidney disease Sister     renal failure  . Diabetes Brother   . Kidney disease Brother     renal failure   Allergies and Medications: Allergies  Allergen Reactions  . Clonidine Derivatives Swelling and Other (See Comments)    Facial swelling  . Aspirin Nausea Only  . Latex Rash   No current facility-administered medications on file prior to encounter.   Current Outpatient Prescriptions on File Prior to Encounter  Medication Sig Dispense Refill  . amLODipine (NORVASC) 10 MG tablet TAKE ONE TABLET BY MOUTH AT BEDTIME 30 tablet 3  . fluticasone (FLONASE) 50 MCG/ACT nasal spray Place 2  sprays into both nostrils daily. 16 g 6  . Hypromellose (ARTIFICIAL TEARS OP) Place 1 drop into both eyes as needed (for dry eyes).    Marland Kitchen levothyroxine (SYNTHROID, LEVOTHROID) 100 MCG tablet TAKE ONE TABLET BY MOUTH IN THE MORNING 90 tablet 1  . lisinopril-hydrochlorothiazide (PRINZIDE,ZESTORETIC) 20-25 MG per tablet TAKE ONE TABLET BY MOUTH ONCE DAILY 90 tablet 1  . loratadine (CLARITIN) 10 MG tablet Take 10 mg by mouth daily.    . metoprolol tartrate (LOPRESSOR) 25 MG tablet TAKE ONE-HALF TABLET BY MOUTH TWICE DAILY 90 tablet 1  .  Multiple Vitamin (MULTIVITAMIN WITH MINERALS) TABS tablet Take 1 tablet by mouth daily.    Marland Kitchen spironolactone (ALDACTONE) 50 MG tablet Take 1 tablet (50 mg total) by mouth daily. 90 tablet 1  . sulfamethoxazole-trimethoprim (BACTRIM DS,SEPTRA DS) 800-160 MG per tablet Take 1 tablet by mouth 2 (two) times daily. 20 tablet 1  . diphenhydrAMINE (BENADRYL) 25 MG tablet Take 1 tablet (25 mg total) by mouth every 6 (six) hours as needed for sleep (with reglan for headaches). (Patient not taking: Reported on 03/05/2015) 10 tablet 0  . doxycycline (VIBRA-TABS) 100 MG tablet Take 1 tablet (100 mg total) by mouth 2 (two) times daily. (Patient not taking: Reported on 03/05/2015) 14 tablet 0  . HYDROcodone-acetaminophen (NORCO/VICODIN) 5-325 MG per tablet Take 1 tablet by mouth every 6 (six) hours as needed for moderate pain. (Patient not taking: Reported on 03/05/2015) 10 tablet 0  . hydrOXYzine (ATARAX/VISTARIL) 10 MG tablet Take 1 tablet (10 mg total) by mouth 3 (three) times daily as needed. (Patient not taking: Reported on 03/05/2015) 20 tablet 0  . metoCLOPramide (REGLAN) 10 MG tablet Take 1 tablet (10 mg total) by mouth every 6 (six) hours as needed for nausea (nausea/headache). 6 tablet 0    Objective: BP 141/75 mmHg  Pulse 57  Temp(Src) 98.5 F (36.9 C) (Oral)  Resp 17  Ht 5' 5"  (1.651 m)  Wt 187 lb (84.823 kg)  BMI 31.12 kg/m2  SpO2 97% Exam: Gen - well-appearing, mild discomfort due to left toe pain but cooperative, NAD HEENT - NCAT, PERRL, EOMI, patent nares w/o congestion, oropharynx clear, MMM Neck - supple, non-tender, no LAD Heart - RRR, no murmurs heard Lungs - CTAB, no wheezing, crackles, or rhonchi. Normal work of breathing. Abd - soft, mild palpable mass in epigastric region with area of prior known hiatal hernia mild +TTP in this region, otherwise no rebound or guarding, +active BS Ext/Skin - warm, dry. No warmth or extending rash. Left 5th toe with some localized edema with superficial  dryness and ulceration without opening in skin, no drainage or bleeding, moderate +TTP over toe and extending into foot and ankle. No erythema extending from 5th toe. Calves non-tender, otherwise asymmetrical, pulses distally intact +2. Neuro - awake, alert, oriented, grossly non-focal, intact muscle strength 5/5 b/l grip and ankle dorsiflexion, intact distal sensation to light touch, observed ambulating with assistance in hallway  Labs and Imaging: CBC BMET   Recent Labs Lab 03/05/15 0524  WBC 7.7  HGB 13.8  HCT 42.7  PLT 282    Recent Labs Lab 03/05/15 0524  NA 136  K 3.8  CL 104  CO2 23  BUN 9  CREATININE 1.03*  GLUCOSE 97  CALCIUM 9.4     9/8 Blood cultures x 2 - collected  ESR 7 (negatie) CRP < 0.5 (negative)  9/8 Left Foot MRI w/ and w/o contrast IMPRESSION: Cellulitis about the left fifth toe with  marrow edema and enhancement in the head of the proximal phalanx and throughout the distal phalanx compatible with osteomyelitis. Negative for abscess.  Olin Hauser, DO 03/05/2015, 10:42 AM PGY-3, [place] [facility] pager: [phone], text pages welcome

## 2015-03-05 NOTE — ED Notes (Signed)
MRI called and stated will be coming soon

## 2015-03-05 NOTE — Consult Note (Signed)
Reason for Consult:  Left 5th toe infection Referring Physician: Dr. Raynelle Chary is an 54 y.o. female.  HPI: 54 y/o female without significant PMH c/o left 5th toe pain for the last several months.  She believes she may have had a bug bite at that location.  She has been on several courses of oral abx by the family medicine clinic as well as [facility].  She denies any h/o diabetes or peripheral neuropathy.  She c/o severe pain in the left 5th toe that is worse while standing and better with rest and elevation.  She denies f/c/n/v/wt loss.  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    Past Medical History  Diagnosis Date  . Hypertension   . Anemia   . Neck strain 02/06/2012  . Graves' disease   . Positive TB test   . H/O hiatal hernia   . Migraine headache     "couple times/month" (02/12/2013)  . Chronic lower back pain     Past Surgical History  Procedure Laterality Date  . Cesarean section  1979 1986 1990  . Appendectomy  2006  . Abdominal hysterectomy  2006    Family History  Problem Relation Age of Onset  . Heart disease Other   . Diabetes Other   . Heart disease Mother     CAB age 62  . Diabetes Mother   . Cancer Father 3    colon cancer  . Stroke Father   . Diabetes Sister   . Kidney disease Sister     renal failure  . Diabetes Brother   . Kidney disease Brother     renal failure    Social History:  reports that she has been smoking Cigarettes.  She has a 7.5 pack-year smoking history. She has never used smokeless tobacco. She reports that she does not drink alcohol or use illicit drugs.  Allergies:  Allergies  Allergen Reactions  . Clonidine Derivatives Swelling and Other (See Comments)    Facial swelling  . Aspirin Nausea Only  . Latex Rash    Medications: I have reviewed the patient's current medications.  Results for orders placed or performed during the hospital encounter of 03/05/15 (from the past 48 hour(s))   Basic metabolic panel     Status: Abnormal   Collection Time: 03/05/15  5:24 AM  Result Value Ref Range   Sodium 136 135 - 145 mmol/L   Potassium 3.8 3.5 - 5.1 mmol/L   Chloride 104 101 - 111 mmol/L   CO2 23 22 - 32 mmol/L   Glucose, Bld 97 65 - 99 mg/dL   BUN 9 6 - 20 mg/dL   Creatinine, Ser 1.03 (H) 0.44 - 1.00 mg/dL   Calcium 9.4 8.9 - 10.3 mg/dL   GFR calc non Af Amer >60 >60 mL/min   GFR calc Af Amer >60 >60 mL/min    Comment: (NOTE) The eGFR has been calculated using the CKD EPI equation. This calculation has not been validated in all clinical situations. eGFR's persistently <60 mL/min signify possible Chronic Kidney Disease.    Anion gap 9 5 - 15  CBC with Differential     Status: Abnormal   Collection Time: 03/05/15  5:24 AM  Result Value Ref Range   WBC 7.7 4.0 - 10.5 K/uL   RBC 5.21 (H) 3.87 - 5.11 MIL/uL   Hemoglobin 13.8 12.0 - 15.0 g/dL   HCT 42.7 36.0 - 46.0 %  MCV 82.0 78.0 - 100.0 fL   MCH 26.5 26.0 - 34.0 pg   MCHC 32.3 30.0 - 36.0 g/dL   RDW 13.9 11.5 - 15.5 %   Platelets 282 150 - 400 K/uL   Neutrophils Relative % 52 43 - 77 %   Neutro Abs 4.0 1.7 - 7.7 K/uL   Lymphocytes Relative 40 12 - 46 %   Lymphs Abs 3.1 0.7 - 4.0 K/uL   Monocytes Relative 4 3 - 12 %   Monocytes Absolute 0.3 0.1 - 1.0 K/uL   Eosinophils Relative 4 0 - 5 %   Eosinophils Absolute 0.3 0.0 - 0.7 K/uL   Basophils Relative 0 0 - 1 %   Basophils Absolute 0.0 0.0 - 0.1 K/uL  Sedimentation rate     Status: None   Collection Time: 03/05/15  5:24 AM  Result Value Ref Range   Sed Rate 7 0 - 22 mm/hr  Uric acid     Status: None   Collection Time: 03/05/15  5:24 AM  Result Value Ref Range   Uric Acid, Serum 5.1 2.3 - 6.6 mg/dL    Mr Foot Left W Wo Contrast  03/05/2015   CLINICAL DATA:  Pain, swelling and discoloration of the left fifth toe for approximately 1 month since multiple insect bites. The patient has been on antibiotics without improvement. Subsequent encounter.  EXAM: MRI OF  THE LEFT FOREFOOT WITHOUT AND WITH CONTRAST  TECHNIQUE: Multiplanar, multisequence MR imaging was performed both before and after administration of intravenous contrast.  CONTRAST:  18 mL MULTIHANCE GADOBENATE DIMEGLUMINE 529 MG/ML IV SOLN  COMPARISON:  Plain films left foot 02/12/2015 and 02/25/2015.  FINDINGS: Subcutaneous edema and enhancement are seen in the webspace between the fourth and fifth toes and about the dorsal aspect of the fifth toe. No abscess is identified. There is marrow edema and enhancement in the distal phalanx of the little toe consistent with osteomyelitis. There is also mild marrow edema in the distal 0.7 cm of the proximal phalanx of the little toe worrisome for osteomyelitis. Bone marrow signal is otherwise unremarkable. All imaged tendons are intact. No mass is identified. All visualized intrinsic musculature of the foot appears normal. No notable arthropathy is seen.  IMPRESSION: Cellulitis about the left fifth toe with marrow edema and enhancement in the head of the proximal phalanx and throughout the distal phalanx compatible with osteomyelitis. Negative for abscess.   Electronically Signed   By: Inge Rise M.D.   On: 03/05/2015 08:24    ROS:  No recent f/c/n/v/wt loss PE:  Blood pressure 138/77, pulse 56, temperature 98.5 F (36.9 C), temperature source Oral, resp. rate 17, height _0  (1.651 m), weight 84.823 kg (187 lb), SpO2 96 %. wn wd woman in nad.  A and O x 4.  Mood and affect normal.  Exophthalmos present with divergent gaze.  Resp unlabored.  L 5th toe swollen and dusky.  Ulcer at the lateral toe adjacent to the middle phalanx.  Sens to LT intact at the foot.  5/5 strength in PF and DF of the toes.  No lymphadenopathy is noted.  No significant cellulitis.  Assessment/Plan: L 5th toe ulcer and osteomyelitis - to OR for left 5th toe amputation.  The risks and benefits of the alternative treatment options have been discussed in detail.  The patient wishes to  proceed with surgery and specifically understands risks of bleeding, infection, nerve damage, blood clots, need for additional surgery, amputation and death.  Wylene Simmer 03/05/2015, 12:41 PM

## 2015-03-06 ENCOUNTER — Encounter (HOSPITAL_COMMUNITY): Payer: Self-pay | Admitting: Orthopedic Surgery

## 2015-03-06 DIAGNOSIS — E039 Hypothyroidism, unspecified: Secondary | ICD-10-CM

## 2015-03-06 DIAGNOSIS — M869 Osteomyelitis, unspecified: Secondary | ICD-10-CM

## 2015-03-06 DIAGNOSIS — I10 Essential (primary) hypertension: Secondary | ICD-10-CM

## 2015-03-06 LAB — CBC
HCT: 42.1 % (ref 36.0–46.0)
Hemoglobin: 13.7 g/dL (ref 12.0–15.0)
MCH: 27 pg (ref 26.0–34.0)
MCHC: 32.5 g/dL (ref 30.0–36.0)
MCV: 82.9 fL (ref 78.0–100.0)
Platelets: 284 10*3/uL (ref 150–400)
RBC: 5.08 MIL/uL (ref 3.87–5.11)
RDW: 13.8 % (ref 11.5–15.5)
WBC: 7.8 10*3/uL (ref 4.0–10.5)

## 2015-03-06 LAB — BASIC METABOLIC PANEL
Anion gap: 10 (ref 5–15)
BUN: 11 mg/dL (ref 6–20)
CO2: 25 mmol/L (ref 22–32)
Calcium: 9.5 mg/dL (ref 8.9–10.3)
Chloride: 99 mmol/L — ABNORMAL LOW (ref 101–111)
Creatinine, Ser: 1.27 mg/dL — ABNORMAL HIGH (ref 0.44–1.00)
GFR calc Af Amer: 55 mL/min — ABNORMAL LOW (ref >60)
GFR calc non Af Amer: 48 mL/min — ABNORMAL LOW (ref >60)
Glucose, Bld: 98 mg/dL (ref 65–99)
Potassium: 4 mmol/L (ref 3.5–5.1)
Sodium: 134 mmol/L — ABNORMAL LOW (ref 135–145)

## 2015-03-06 MED ORDER — OXYCODONE HCL 5 MG PO TABS: 5.0000 mg | ORAL_TABLET | ORAL | Status: DC | PRN

## 2015-03-06 MED ORDER — OXYCODONE HCL 5 MG PO TABS
5.0000 mg | ORAL_TABLET | ORAL | Status: DC | PRN
  Administered 2015-03-06 – 2015-03-07 (×7): 10 mg via ORAL
  Filled 2015-03-06 (×9): qty 2

## 2015-03-06 MED ORDER — SENNOSIDES-DOCUSATE SODIUM 8.6-50 MG PO TABS: 1.0000 | ORAL_TABLET | Freq: Every evening | ORAL | Status: DC | PRN

## 2015-03-06 MED ORDER — KETOROLAC TROMETHAMINE 30 MG/ML IJ SOLN
30.0000 mg | Freq: Once | INTRAMUSCULAR | Status: AC
  Administered 2015-03-06: 30 mg via INTRAVENOUS
  Filled 2015-03-06: qty 1

## 2015-03-06 NOTE — Discharge Summary (Signed)
[facility] Discharge Summary  Patient name: Teresa [name] Medical record number: [id number] Date of birth: 1960/12/26 Age: 54 y.o. Gender: female Date of Admission: 03/05/2015  Date of Discharge: 03/07/2015 Admitting Physician: Wylene Simmer, MD  Primary Care Provider: [name], MD Consultants: Orthopedics  Indication for Hospitalization:  Left toe osteomyelitis  Discharge Diagnoses/Problem List:   Patient Active Problem List   Diagnosis Date Noted  . Osteomyelitis 03/05/2015  . Osteomyelitis of toe of left foot 03/05/2015  . Essential hypertension   . Thyroid activity decreased   . Pain in toe of left foot 02/12/2015  . Infected insect bite 01/05/2015  . Acute frontal sinusitis 08/06/2014  . Right shoulder pain 04/15/2014  . Right elbow pain 04/15/2014  . Obesity 04/15/2014  . Health care maintenance 04/15/2014  . Right hip pain 02/11/2014  . Neck pain 02/11/2014  . Abdominal pain, chronic, epigastric 02/11/2014  . Chest pain 01/27/2014  . Left knee pain 08/06/2013  . Lateral knee pain 07/19/2013  . Allergic rhinitis 07/19/2013  . Thyroid nodule 02/12/2013  . GERD (gastroesophageal reflux disease) 02/06/2012  . Tobacco abuse 09/21/2011  . Hypertension 09/21/2011  . Hypothyroidism 09/21/2011  . Throat pain 09/21/2011    Disposition: Home  Discharge Condition: Stable  Discharge Exam:   Objective: Temp: [97.8 F (36.6 C)-98.7 F (37.1 C)] 97.8 F (36.6 C) (09/10 0559) Pulse Rate: [57-77] 65 (09/10 1004) Resp: [18] 18 (09/10 0559) BP: (137-149)/(69-83) 138/83 mmHg (09/10 1004) SpO2: [96 %-100 %] 100 % (09/10 1004) Physical Exam: General: NAD, sitting up in bed Cardiovascular: RRR, no m/g/r Respiratory: CTA bilaterally Abdomen: soft, non tender, non distended Extremities: left foot wrapped, dressing c/d/i, no LE edema  Brief Hospital Course:   54 y/o F admitted for left 5th toe pain after failed outpatient cellulitis  treatment and found to have osteomyelitis Orthopedic surgery was consulted and agreed to perform a 5th toe amputation. Her peri opertative course was uncomplicated. She was maintained on 24 hours of IV vancomycin post operatively and was then transitioned to oral clindamycin for 7 days post op without issue. Her pain was controlled with oral oxycodone and IV morphine. She was discharged with 30 tabs of oxycodone She had blood cultures drawn on admission which remained negative at discharge Additionally it was unclear on admission that she was taking her prescribed spironolactone so it was held while in patient and she was directed to stop taking it at discharge.Her blood pressures were well controlled on Amlodipine, Lisinopril-HCTZ   Issues for Follow Up:  1. Improvement on oral antibiotic 2. Blood pressures while off of spironolactone  Significant Procedures:  Left 5th toe amputation  Significant Labs and Imaging:   Recent Labs Lab 03/05/15 0524 03/06/15 0538 03/07/15 0451  WBC 7.7 7.8 8.1  HGB 13.8 13.7 12.9  HCT 42.7 42.1 39.1  PLT 282 284 273    Recent Labs Lab 03/05/15 0524 03/06/15 0538 03/07/15 0451  NA 136 134* 136  K 3.8 4.0 3.5  CL 104 99* 100*  CO2 23 25 28   GLUCOSE 97 98 116*  BUN 9 11 13   CREATININE 1.03* 1.27* 1.28*  CALCIUM 9.4 9.5 9.3    Results/Tests Pending at Time of Discharge:  Blood cultures  Discharge Medications:    Medication List    STOP taking these medications        spironolactone 50 MG tablet  Commonly known as:  ALDACTONE      TAKE these medications  amLODipine 10 MG tablet  Commonly known as:  NORVASC  TAKE ONE TABLET BY MOUTH AT BEDTIME     ARTIFICIAL TEARS OP  Place 1 drop into both eyes as needed (for dry eyes).     clindamycin 300 MG capsule  Commonly known as:  CLEOCIN  Take 1 capsule (300 mg total) by mouth 4 (four) times daily.     diphenhydrAMINE 25 MG tablet  Commonly known as:  BENADRYL  Take 1 tablet  (25 mg total) by mouth every 6 (six) hours as needed for sleep (with reglan for headaches).     fluticasone 50 MCG/ACT nasal spray  Commonly known as:  FLONASE  Place 2 sprays into both nostrils daily.     hydrOXYzine 10 MG tablet  Commonly known as:  ATARAX/VISTARIL  Take 1 tablet (10 mg total) by mouth 3 (three) times daily as needed.     levothyroxine 100 MCG tablet  Commonly known as:  SYNTHROID, LEVOTHROID  TAKE ONE TABLET BY MOUTH IN THE MORNING     lisinopril-hydrochlorothiazide 20-25 MG per tablet  Commonly known as:  PRINZIDE,ZESTORETIC  TAKE ONE TABLET BY MOUTH ONCE DAILY     loratadine 10 MG tablet  Commonly known as:  CLARITIN  Take 10 mg by mouth daily.     metoCLOPramide 10 MG tablet  Commonly known as:  REGLAN  Take 1 tablet (10 mg total) by mouth every 6 (six) hours as needed for nausea (nausea/headache).     metoprolol tartrate 25 MG tablet  Commonly known as:  LOPRESSOR  TAKE ONE-HALF TABLET BY MOUTH TWICE DAILY     multivitamin with minerals Tabs tablet  Take 1 tablet by mouth daily.     oxyCODONE 5 MG immediate release tablet  Commonly known as:  Oxy IR/ROXICODONE  Take 1-2 tablets (5-10 mg total) by mouth every 4 (four) hours as needed for moderate pain or severe pain.     senna-docusate 8.6-50 MG per tablet  Commonly known as:  Senokot-S  Take 1 tablet by mouth at bedtime as needed for mild constipation.        Discharge Instructions: Please refer to Patient Instructions section of EMR for full details.  Patient was counseled important signs and symptoms that should prompt return to medical care, changes in medications, dietary instructions, activity restrictions, and follow up appointments.   Follow-Up Appointments: Follow-up Information    Follow up with HEWITT, Jenny Reichmann, MD. Schedule an appointment as soon as possible for a visit in 2 weeks.   Specialty:  Orthopedic Surgery   Contact information:   [address] [place]  [address] [phone]       Follow up with Evette Doffing, MD On 03/11/2015.   Specialty:  Family Medicine   Why:  3:00 pm   Contact information:   [place] [address] [phone]       Veatrice Bourbon, MD  03/09/2015, 3:14 PM PGY-2, [place]

## 2015-03-06 NOTE — Progress Notes (Signed)
Called  In the room by patient. Pt noticed she had a raised area above her L eye. Pt denies any itching and the area is not red. Pt thinks it may be an allergic reaction. Pt does not have anymore spots on her body that look like this one. Will continue to monitor

## 2015-03-06 NOTE — Progress Notes (Signed)
Family Medicine Teaching Service Daily Progress Note Intern Pager: [phone]  Patient name: Teresa [name] Medical record number: [id number] Date of birth: 1961-03-16 Age: 54 y.o. Gender: female  Primary Care Provider: [name], MD Consultants: Orthopedics Code Status: Full  Pt Overview and Major Events to Date:  9/8 admitted for osteomyelitis, left 5th toe amputation  Assessment and Plan:  Teresa [name] is a 54 y.o. female presenting with Left 5th toe pain and swelling, chronic x 2 months with failed outpatient therapy for cellulitis, now with confirmed osteomyelitis on MRI. PMH is significant for HTN, H/o Grave's disease now hypothyroidism, hiatal hernia, migraines, chronic back pain.  # Osteomyelitis with ulceration, subacute to chronic, Left 5th toe - POD1 s/p left 5th toe amputation - D2 IV nac, per ortho ciotninue for 24 hours, has only had one dose at 1 am 9/9, transition to oral clinda after 24 hours on 9/10 AM to continue for 7 days total - f/u with ortho in 2 weeks - oxycodone 5-10 mg q4 with IV morphine for pain control, consider d/c IV morphine on 9/10 - BCx NGTD - F/u PT/OT recs  AKI- Cr 1.27 today, baseline around 1, likely in setting of post op - trend Cr - encourage good PO  HTN- Stable  - continue home amlodipine, lisinopril./HCTZ - holding home spiro  # Hypothyroidism - Continue home Levothyroxine 126mcg daily  # Hiatal Hernia - Protonix daily  FEN/GI Reg diet, SLIV Prophylaxis: Lovenox SQ daily  Disposition: Likely d/c to home 9/10  Subjective:  Able to tolerate PO, nop chest pain, SOB, reports post op pain overnight  Objective: Temp:  [97.6 F (36.4 C)-98.2 F (36.8 C)] 97.8 F (36.6 C) (09/09 0600) Pulse Rate:  [55-109] 56 (09/09 0600) Resp:  [10-18] 18 (09/09 0600) BP: (125-169)/(61-83) 149/82 mmHg (09/09 0600) SpO2:  [90 %-100 %] 98 % (09/09 0600) FiO2 (%):  [21 %] 21 % (09/08 1800) Physical Exam: General: NAD, lying in  bed Cardiovascular: RRR, no murmurs Respiratory: CTAB Abdomen: soft, non tender, non distended Extremities: left foot wrapped, dressing c/d/i, no LE edema  Laboratory:  Recent Labs Lab 03/05/15 0524 03/06/15 0538  WBC 7.7 7.8  HGB 13.8 13.7  HCT 42.7 42.1  PLT 282 284    Recent Labs Lab 03/05/15 0524 03/06/15 0538  NA 136 134*  K 3.8 4.0  CL 104 99*  CO2 23 25  BUN 9 11  CREATININE 1.03* 1.27*  CALCIUM 9.4 9.5  GLUCOSE 97 98      Imaging/Diagnostic Tests: MRI left foot  IMPRESSION: Cellulitis about the left fifth toe with marrow edema and enhancement in the head of the proximal phalanx and throughout the distal phalanx compatible with osteomyelitis. Negative for abscess.  Veatrice Bourbon, MD 03/06/2015, 9:39 AM PGY-2, [place] Intern pager: [phone], text pages welcome

## 2015-03-06 NOTE — Anesthesia Postprocedure Evaluation (Signed)
  Anesthesia Post-op Note  Patient: Teresa [name]  Procedure(s) Performed: Procedure(s): AMPUTATION LEFT 5TH  TOE (Left)  Patient Location: PACU  Anesthesia Type:General  Level of Consciousness: awake and alert   Airway and Oxygen Therapy: Patient Spontanous Breathing  Post-op Pain: none  Post-op Assessment: Post-op Vital signs reviewed              Post-op Vital Signs: Reviewed  Last Vitals:  Filed Vitals:   03/06/15 0600  BP: 149/82  Pulse: 56  Temp: 36.6 C  Resp: 18    Complications: No apparent anesthesia complications

## 2015-03-06 NOTE — Evaluation (Signed)
Physical Therapy Evaluation Patient Details Name: Teresa [name] MRN: [id number] DOB: [birth year] Today's Date: 03/06/2015   History of Present Illness  Teresa [name] is a 54 y.o. female presenting with Left 5th toe pain and swelling, chronic x 2 months with failed outpatient therapy for cellulitis, now with confirmed osteomyelitis on MRI. PMH is significant for HTN, H/o Grave's disease now hypothyroidism, hiatal hernia, migraines, chronic back pain.  Clinical Impression  Pt presents with acute pain and limited mobility secondary to L 5th toe amputation. Pt is mod. Independence using a RW and post op shoe. Pt completed stair training with min assist. All PT education is complete and pt is cleared to ambulate in the room or hall with RW and post op shoe until d/c home. Pt reports she can use her mother's RW and does not need any follow-up therapy. Pt is d/c from PT services.    Follow Up Recommendations No PT follow up    Equipment Recommendations  None recommended by PT    Recommendations for Other Services       Precautions / Restrictions Precautions Precaution Comments: contact Restrictions Weight Bearing Restrictions: Yes LLE Weight Bearing: Weight bearing as tolerated Other Position/Activity Restrictions: post op shoe      Mobility  Bed Mobility Overal bed mobility: Independent                Transfers Overall transfer level: Modified independent               General transfer comment: reinforced hand placement on bed or chair for improved safety.  Ambulation/Gait Ambulation/Gait assistance: Modified independent (Device/Increase time) Ambulation Distance (Feet): 400 Feet Assistive device: Rolling walker (2 wheeled) Gait Pattern/deviations: Step-through pattern        Stairs Stairs: Yes Stairs assistance: Min assist Stair Management: One rail Left Number of Stairs: 4    Wheelchair Mobility    Modified Rankin (Stroke Patients Only)        Balance Overall balance assessment: Needs assistance Sitting-balance support: No upper extremity supported Sitting balance-Leahy Scale: Normal     Standing balance support: No upper extremity supported Standing balance-Leahy Scale: Fair                               Pertinent Vitals/Pain Pain Assessment: 0-10 Pain Score: 6  Pain Descriptors / Indicators: Discomfort Pain Intervention(s): Limited activity within patient's tolerance;Monitored during session    Home Living Family/patient expects to be discharged to:: Private residence Living Arrangements: Children Available Help at Discharge: Family Type of Home: Apartment Home Access: Stairs to enter Entrance Stairs-Rails: Left Entrance Stairs-Number of Steps: 5 Home Layout: One level Home Equipment: Environmental consultant - 2 wheels      Prior Function Level of Independence: Independent               Hand Dominance        Extremity/Trunk Assessment   Upper Extremity Assessment: Defer to OT evaluation           Lower Extremity Assessment: Overall WFL for tasks assessed         Communication   Communication: No difficulties  Cognition Arousal/Alertness: Awake/alert Behavior During Therapy: WFL for tasks assessed/performed Overall Cognitive Status: Within Functional Limits for tasks assessed                      General Comments      Exercises  Assessment/Plan    PT Assessment Patent does not need any further PT services  PT Diagnosis     PT Problem List    PT Treatment Interventions     PT Goals (Current goals can be found in the Care Plan section)      Frequency     Barriers to discharge        Co-evaluation               End of Session Equipment Utilized During Treatment: Gait belt Activity Tolerance: Patient tolerated treatment well Patient left: in bed;with call bell/phone within reach Nurse Communication: Mobility status (pt is mod Indep with RW)          Time: 7106-2694 PT Time Calculation (min) (ACUTE ONLY): 45 min   Charges:   PT Evaluation $Initial PT Evaluation Tier I: 1 Procedure PT Treatments $Gait Training: 8-22 mins $Therapeutic Activity: 8-22 mins   PT G Codes:        Lelon Mast 03/06/2015, 10:26 AM

## 2015-03-06 NOTE — OR Nursing (Signed)
Late entry for delay code documentation. 

## 2015-03-06 NOTE — Progress Notes (Signed)
Subjective: 1 Day Post-Op Procedure(s) (LRB): AMPUTATION LEFT 5TH  TOE (Left) Patient reports pain as moderate.  Improved with oxycodone.  Needed IV morphine overnight.  No n/v/f/c.  One dose of vanc so far post op.  Objective: Vital signs in last 24 hours: Temp:  [97.6 F (36.4 C)-98.2 F (36.8 C)] 97.8 F (36.6 C) (09/09 0600) Pulse Rate:  [55-109] 56 (09/09 0600) Resp:  [10-18] 18 (09/09 0600) BP: (125-169)/(61-83) 149/82 mmHg (09/09 0600) SpO2:  [90 %-100 %] 98 % (09/09 0600) FiO2 (%):  [21 %] 21 % (09/08 1800)  Intake/Output from previous day: 09/08 0701 - 09/09 0700 In: 600 [I.V.:600] Out: -  Intake/Output this shift:     Recent Labs  03/05/15 0524 03/06/15 0538  HGB 13.8 13.7    Recent Labs  03/05/15 0524 03/06/15 0538  WBC 7.7 7.8  RBC 5.21* 5.08  HCT 42.7 42.1  PLT 282 284    Recent Labs  03/05/15 0524 03/06/15 0538  NA 136 134*  K 3.8 4.0  CL 104 99*  CO2 23 25  BUN 9 11  CREATININE 1.03* 1.27*  GLUCOSE 97 98  CALCIUM 9.4 9.5   No results for input(s): LABPT, INR in the last 72 hours.  PE:  wn wd woman in nad.  L foot dressed and dry.  Assessment/Plan: 1 Day Post-Op Procedure(s) (LRB): AMPUTATION LEFT 5TH  TOE (Left) 24 hrs vanc post op.  Would consider transition to orals for discharge.  No signs of infection proximal to ampuation. Site.  F/u with me in clinic in two weeks for suture removal.  Pain control per FPTS.  WBAT on L LE in post op shoe.  Wylene Simmer 03/06/2015, 7:42 AM

## 2015-03-06 NOTE — Care Management (Signed)
Utilization review completed. Loyda Costin, RN Case Manager [phone]. 

## 2015-03-06 NOTE — Op Note (Signed)
NAMEJAPLEEN, Teresa [name] NO.:  [id number]  MEDICAL RECORD NO.:  [id number]  LOCATION:  5N19C                        FACILITY:  [facility]  PHYSICIAN:  Wylene Simmer, MD        DATE OF BIRTH:  [birth year]  DATE OF PROCEDURE:  03/05/2015 DATE OF DISCHARGE:                              OPERATIVE REPORT   PREOPERATIVE DIAGNOSIS:  Left fifth toe osteomyelitis.  POSTOPERATIVE DIAGNOSIS:  Left fifth toe osteomyelitis.  PROCEDURE:  Left fifth toe amputation through the MTP joint.  SURGEON:  Wylene Simmer, MD  ANESTHESIA:  General, regional.  ESTIMATED BLOOD LOSS:  Minimal.  TOURNIQUET TIME:  10 minutes with an ankle Esmarch.  COMPLICATIONS:  None apparent.  DISPOSITION:  Extubated, awake, and stable to recovery.  INDICATIONS FOR PROCEDURE:  The patient is a 54 year old female without significant past medical history.  She has a history of what she believes to be an insect bite of her left fifth toe several months ago. She has been treated at various times by the [facility] as well as Dr. Paulla Dolly at [facility].  She has had several courses of oral antibiotics and continues to have severe pain and swelling at the fifth toe.  She presented to the emergency department this morning.  An MRI was obtained after x-rays were equivocal.  The MRI showed changes consistent with osteomyelitis of the distal and middle phalanges of the left fifth toe.  She presents now for operative treatment of this condition.  She understands the risks and benefits, the alternative treatment options, and elects surgical treatment.  She specifically understands risks of bleeding, infection, nerve damage, blood clots, need for additional surgery, continued pain, recurrence of her infection, revision, amputation, and death.  PROCEDURE IN DETAIL:  After preoperative consent was obtained and the correct operative site was identified, the patient was brought to the operating room and  placed supine on the operating table.  General anesthesia was induced.  Preoperative antibiotics were administered. Surgical time-out was taken.  Left lower extremity was prepped and draped in standard sterile fashion.  Foot was exsanguinated and a 4-inch Esmarch tourniquet was wrapped around the ankle.  A racquet-style incision was marked at the base of the fifth toe.  Sharp dissection was carried down through the skin and subcutaneous tissue.  Subperiosteal dissection was carried along the proximal phalanx and the toe was disarticulated through the MTP joint.  The remaining soft tissues appeared healthy.  Neurovascular bundles were cauterized.  Wound was irrigated copiously.  Surgical incision was then closed with horizontal mattress sutures of 3-0 nylon.  Sterile dressings were applied, followed by a compression wrap.  Tourniquet was released at 10 minutes after application of the dressings.  The patient was awakened from anesthesia and transported to the recovery room in stable condition.  FOLLOWUP PLAN:  The patient will be weightbearing as tolerated on the left lower extremity in a flat postop shoe.  She will follow up with me in 2 weeks for suture removal.     Wylene Simmer, MD     JH/MEDQ  D:  03/05/2015  T:  03/06/2015  Job:  [id number]

## 2015-03-07 DIAGNOSIS — B9689 Other specified bacterial agents as the cause of diseases classified elsewhere: Secondary | ICD-10-CM

## 2015-03-07 DIAGNOSIS — M86272 Subacute osteomyelitis, left ankle and foot: Secondary | ICD-10-CM

## 2015-03-07 DIAGNOSIS — Z89422 Acquired absence of other left toe(s): Secondary | ICD-10-CM

## 2015-03-07 LAB — CBC
HCT: 39.1 % (ref 36.0–46.0)
Hemoglobin: 12.9 g/dL (ref 12.0–15.0)
MCH: 27.2 pg (ref 26.0–34.0)
MCHC: 33 g/dL (ref 30.0–36.0)
MCV: 82.5 fL (ref 78.0–100.0)
Platelets: 273 10*3/uL (ref 150–400)
RBC: 4.74 MIL/uL (ref 3.87–5.11)
RDW: 13.8 % (ref 11.5–15.5)
WBC: 8.1 10*3/uL (ref 4.0–10.5)

## 2015-03-07 LAB — BASIC METABOLIC PANEL
Anion gap: 8 (ref 5–15)
BUN: 13 mg/dL (ref 6–20)
CO2: 28 mmol/L (ref 22–32)
Calcium: 9.3 mg/dL (ref 8.9–10.3)
Chloride: 100 mmol/L — ABNORMAL LOW (ref 101–111)
Creatinine, Ser: 1.28 mg/dL — ABNORMAL HIGH (ref 0.44–1.00)
GFR calc Af Amer: 55 mL/min — ABNORMAL LOW (ref >60)
GFR calc non Af Amer: 47 mL/min — ABNORMAL LOW (ref >60)
Glucose, Bld: 116 mg/dL — ABNORMAL HIGH (ref 65–99)
Potassium: 3.5 mmol/L (ref 3.5–5.1)
Sodium: 136 mmol/L (ref 135–145)

## 2015-03-07 MED ORDER — CLINDAMYCIN HCL 300 MG PO CAPS
300.0000 mg | ORAL_CAPSULE | Freq: Four times a day (QID) | ORAL | Status: DC
  Administered 2015-03-07: 300 mg via ORAL
  Filled 2015-03-07: qty 1

## 2015-03-07 MED ORDER — CLINDAMYCIN HCL 300 MG PO CAPS: 300.0000 mg | ORAL_CAPSULE | Freq: Four times a day (QID) | ORAL | Status: DC

## 2015-03-07 NOTE — Progress Notes (Signed)
   Subjective: 2 Days Post-Op Procedure(s) (LRB): AMPUTATION LEFT 5TH  TOE (Left) Patient reports pain as mild and moderate.   Patient seen in rounds with Dr. Wynelle Link. Sitting up in bed. Patient is having problems with pain in the foot, requiring pain medications  Home when stable from Medical standpoint.  Objective: Vital signs in last 24 hours: Temp:  [97.8 F (36.6 C)-98.7 F (37.1 C)] 97.8 F (36.6 C) (09/10 0559) Pulse Rate:  [57-77] 58 (09/10 0559) Resp:  [18] 18 (09/10 0559) BP: (137-149)/(69-73) 137/70 mmHg (09/10 0559) SpO2:  [96 %-100 %] 97 % (09/10 0559)  Intake/Output from previous day: 09/09 0701 - 09/10 0700 In: 480 [P.O.:480] Out: -   Recent Labs  03/05/15 0524 03/06/15 0538 03/07/15 0451  HGB 13.8 13.7 12.9    Recent Labs  03/06/15 0538 03/07/15 0451  WBC 7.8 8.1  RBC 5.08 4.74  HCT 42.1 39.1  PLT 284 273    Recent Labs  03/06/15 0538 03/07/15 0451  NA 134* 136  K 4.0 3.5  CL 99* 100*  CO2 25 28  BUN 11 13  CREATININE 1.27* 1.28*  GLUCOSE 98 116*  CALCIUM 9.5 9.3   No results for input(s): LABPT, INR in the last 72 hours.  EXAM General - Patient is Alert and Appropriate Extremity - Neurovascular intact Sensation intact distally to toes showing Dressing - dressing C/D/I  Past Medical History  Diagnosis Date  . Hypertension   . Anemia   . Neck strain 02/06/2012  . Positive TB test   . H/O hiatal hernia   . Chronic lower back pain   . Osteomyelitis     [name] 03/05/2015  . Hypothyroidism   . Hyperthyroidism     hx  . GERD (gastroesophageal reflux disease)   . Migraine headache     "a few times/yr" (03/05/2015)    Assessment/Plan: 2 Days Post-Op Procedure(s) (LRB): AMPUTATION LEFT 5TH  TOE (Left) Active Problems:   Osteomyelitis   Osteomyelitis of toe of left foot   Essential hypertension   Thyroid activity decreased  Estimated body mass index is 31.12 kg/(m^2) as calculated from the following:   Height as of this  encounter: 5\' 5"  (1.651 m).   Weight as of this encounter: 84.823 kg (187 lb). Discharge home when okay from Medical standpoint  Weight-Bearing as tolerated to left leg, Postop shoe for ambulation. F.U with Dr. Doran Durand in two weeks for suture removal. D/C O2 and Pulse OX and try on [place], PA-C Orthopaedic Surgery 03/07/2015, 9:30 AM

## 2015-03-07 NOTE — Discharge Instructions (Signed)
You were admitted for a left toe bone infection and needed to have it amputated. You will be discharged with antibiotics, please take them all. Please call the orthopedic office of you have fevers, chills worsening pain at your amputation site Take Clindamycin 300 mg every 6 hours (4 times daily) starting this afternoon. You will need to take this medication for a total of 7 days.   Please follow up as below:  Follow-up Information    Follow up with HEWITT, Jenny Reichmann, MD. Schedule an appointment as soon as possible for a visit in 2 weeks.   Specialty:  Orthopedic Surgery   Contact information:   [address] [place] [address] [phone]       Follow up with Evette Doffing, MD On 03/11/2015.   Specialty:  Family Medicine   Why:  3:00 pm   Contact information:   [place] [address] [phone]

## 2015-03-07 NOTE — Progress Notes (Signed)
Family Medicine Teaching Service Daily Progress Note Intern Pager: [phone]  Patient name: Teresa [name] Medical record number: [id number] Date of birth: 1961-05-20 Age: 53 y.o. Gender: female  Primary Care Provider: [name], MD Consultants: Orthopedics Code Status: Full  Pt Overview and Major Events to Date:  9/8 admitted for osteomyelitis, left 5th toe amputation  Assessment and Plan:  Teresa [name] is a 54 y.o. female presenting with Left 5th toe pain and swelling, chronic x 2 months with failed outpatient therapy for cellulitis, now with confirmed osteomyelitis on MRI. PMH is significant for HTN, H/o Grave's disease now hypothyroidism, hiatal hernia, migraines, chronic back pain.  Osteomyelitis with ulceration, subacute to chronic, Left 5th toe s/p left 5th toe amputation on 9/9. Pain well controlled.  - Discharge home today - Transition from IV Vanc to oral clinda. Will discharge on 7 day course. - f/u with ortho in 2 weeks - oxycodone 5-10 mg q4 with IV morphine for pain control - BCx NGTD - F/u PT recs>> none needed   Mild AKI- Cr 1.27>1.28 today, baseline around 1, likely in setting of post op Reports good PO intake. - Will need bmet at outpatient follow up visit   HTN- Stable  - continue home amlodipine, lisinopril./HCTZ - Will hold spironolactone upon discharge as there was question if patient taking  Hypothyroidism - Continue home Levothyroxine 175mcg daily  Hiatal Hernia - Protonix daily  FEN/GI Reg diet, SLIV Prophylaxis: Lovenox SQ daily  Disposition: Discharge home today  Subjective:  Patient feeling well this morning. Reports her pain is controlled. Tolerating good PO intake.   Objective: Temp:  [97.8 F (36.6 C)-98.7 F (37.1 C)] 97.8 F (36.6 C) (09/10 0559) Pulse Rate:  [57-77] 65 (09/10 1004) Resp:  [18] 18 (09/10 0559) BP: (137-149)/(69-83) 138/83 mmHg (09/10 1004) SpO2:  [96 %-100 %] 100 % (09/10 1004) Physical Exam: General: NAD,  sitting up in bed Cardiovascular: RRR, no m/g/r Respiratory: CTA bilaterally Abdomen: soft, non tender, non distended Extremities: left foot wrapped, dressing c/d/i, no LE edema  Laboratory:  Recent Labs Lab 03/05/15 0524 03/06/15 0538 03/07/15 0451  WBC 7.7 7.8 8.1  HGB 13.8 13.7 12.9  HCT 42.7 42.1 39.1  PLT 282 284 273    Recent Labs Lab 03/05/15 0524 03/06/15 0538 03/07/15 0451  NA 136 134* 136  K 3.8 4.0 3.5  CL 104 99* 100*  CO2 23 25 28   BUN 9 11 13   CREATININE 1.03* 1.27* 1.28*  CALCIUM 9.4 9.5 9.3  GLUCOSE 97 98 116*      Imaging/Diagnostic Tests: MRI left foot  IMPRESSION: Cellulitis about the left fifth toe with marrow edema and enhancement in the head of the proximal phalanx and throughout the distal phalanx compatible with osteomyelitis. Negative for abscess.  Juliet Rude, MD 03/07/2015, 12:40 PM PGY-II, [facility] FPTS Intern pager: [phone], text pages welcome

## 2015-03-07 NOTE — Care Management Note (Signed)
Case Management Note  Patient Details  Name: Teresa [name] MRN: [id number] Date of Birth: 15-Apr-1961  Subjective/Objective:                  AMPUTATION LEFT 5TH TOE (Left)  Action/Plan: CM spoke to pt at the bedside and offered choice for DME RW with seat and member chose [facility]. Cm called and spoke with Merry Proud with [facility] DME and advised of order. Information accepted and advised it would be delivered to the room. Pt states that she thought she could use her mother's walker but her mother ended up saying that she needed it for herself. Pt denies any further HH or discharge planning needs. CM available for any additional needs that may arise prior to d/c.   Expected Discharge Date:  03/07/15              Expected Discharge Plan:  Home/Self Care  In-House Referral:  NA  Discharge planning Services  CM Consult  Post Acute Care Choice:  Durable Medical Equipment Choice offered to:  Patient  DME Arranged:  Walker rolling with seat DME Agency:  [place]:    [place] Agency:     Status of Service:  Completed, signed off  Medicare Important Message Given:    Date Medicare IM Given:    Medicare IM give by:    Date Additional Medicare IM Given:    Additional Medicare Important Message give by:     If discussed at [place] of Stay Meetings, dates discussed:    Additional Comments:  Guido Sander, RN 03/07/2015, 4:01 PM

## 2015-03-07 NOTE — Progress Notes (Signed)
Discharge instructions and prescription provided to patient, additional prescription was called into pharmacy.  Patient had no questions.  All belongings with patient.  Front wheel walker with seat delivered to room.  Patient escorted to car via wheelchair, mother transported home.

## 2015-03-09 ENCOUNTER — Emergency Department (HOSPITAL_COMMUNITY)
Admission: EM | Admit: 2015-03-09 | Discharge: 2015-03-10 | Disposition: A | Discharge status: Home / Self Care | Payer: No Typology Code available for payment source | Attending: Emergency Medicine | Admitting: Emergency Medicine

## 2015-03-09 ENCOUNTER — Encounter (HOSPITAL_COMMUNITY): Payer: Self-pay | Admitting: Emergency Medicine

## 2015-03-09 DIAGNOSIS — T368X5A Adverse effect of other systemic antibiotics, initial encounter: Secondary | ICD-10-CM | POA: Insufficient documentation

## 2015-03-09 DIAGNOSIS — Z7951 Long term (current) use of inhaled steroids: Secondary | ICD-10-CM | POA: Insufficient documentation

## 2015-03-09 DIAGNOSIS — Z862 Personal history of diseases of the blood and blood-forming organs and certain disorders involving the immune mechanism: Secondary | ICD-10-CM | POA: Insufficient documentation

## 2015-03-09 DIAGNOSIS — Z720 Tobacco use: Secondary | ICD-10-CM | POA: Diagnosis not present

## 2015-03-09 DIAGNOSIS — E0590 Thyrotoxicosis, unspecified without thyrotoxic crisis or storm: Secondary | ICD-10-CM | POA: Diagnosis not present

## 2015-03-09 DIAGNOSIS — Z79899 Other long term (current) drug therapy: Secondary | ICD-10-CM | POA: Diagnosis not present

## 2015-03-09 DIAGNOSIS — G8929 Other chronic pain: Secondary | ICD-10-CM | POA: Diagnosis not present

## 2015-03-09 DIAGNOSIS — Z889 Allergy status to unspecified drugs, medicaments and biological substances status: Secondary | ICD-10-CM | POA: Diagnosis not present

## 2015-03-09 DIAGNOSIS — K219 Gastro-esophageal reflux disease without esophagitis: Secondary | ICD-10-CM | POA: Diagnosis not present

## 2015-03-09 DIAGNOSIS — Z8739 Personal history of other diseases of the musculoskeletal system and connective tissue: Secondary | ICD-10-CM | POA: Insufficient documentation

## 2015-03-09 DIAGNOSIS — I10 Essential (primary) hypertension: Secondary | ICD-10-CM | POA: Insufficient documentation

## 2015-03-09 DIAGNOSIS — Z91040 Latex allergy status: Secondary | ICD-10-CM | POA: Insufficient documentation

## 2015-03-09 DIAGNOSIS — E039 Hypothyroidism, unspecified: Secondary | ICD-10-CM | POA: Diagnosis not present

## 2015-03-09 DIAGNOSIS — Z87828 Personal history of other (healed) physical injury and trauma: Secondary | ICD-10-CM | POA: Insufficient documentation

## 2015-03-09 DIAGNOSIS — R220 Localized swelling, mass and lump, head: Secondary | ICD-10-CM | POA: Diagnosis not present

## 2015-03-09 DIAGNOSIS — T7840XA Allergy, unspecified, initial encounter: Secondary | ICD-10-CM

## 2015-03-09 MED ORDER — PREDNISONE 20 MG PO TABS: ORAL_TABLET | ORAL | Status: DC

## 2015-03-09 MED ORDER — FAMOTIDINE 20 MG PO TABS: 20.0000 mg | ORAL_TABLET | Freq: Two times a day (BID) | ORAL | Status: DC

## 2015-03-09 MED ORDER — DIPHENHYDRAMINE HCL 25 MG PO TABS
25.0000 mg | ORAL_TABLET | Freq: Four times a day (QID) | ORAL | Status: DC
Start: 2015-03-09 — End: 2015-05-28

## 2015-03-09 MED ORDER — DIPHENHYDRAMINE HCL 50 MG/ML IJ SOLN
INTRAMUSCULAR | Status: AC
  Administered 2015-03-09: 50 mg
  Filled 2015-03-09: qty 1

## 2015-03-09 MED ORDER — FAMOTIDINE IN NACL 20-0.9 MG/50ML-% IV SOLN
20.0000 mg | Freq: Once | INTRAVENOUS | Status: AC
  Administered 2015-03-09: 20 mg via INTRAVENOUS

## 2015-03-09 MED ORDER — METHYLPREDNISOLONE SODIUM SUCC 125 MG IJ SOLR
INTRAMUSCULAR | Status: AC
  Administered 2015-03-09: 125 mg
  Filled 2015-03-09: qty 2

## 2015-03-09 NOTE — ED Notes (Signed)
Pt. presents with lower lip swelling , tongue swelling , sore throat and facial swelling/itching  onset this evening - suspects  prescription antibiotic for her recent left toe amputation . Airway intact with mild SOB . No stridor.

## 2015-03-09 NOTE — ED Notes (Signed)
Patient is resting comfortably. 

## 2015-03-09 NOTE — Discharge Instructions (Signed)
Drug Allergy Although you noticed your reaction after starting clindamycin, your blood pressure medication (lisinopril) can cause a facial swelling as well. This can be very serious and is called Angioedema (see description below). At this time stop both your antibiotic as well as your lisinopril. Take prescribed medications for allergy. Return to the ER if your symptoms return/worsen.  Allergic reactions to medicines are common. Some allergic reactions are mild. A delayed type of drug allergy that occurs 1 week or more after exposure to a medicine or vaccine is called serum sickness. A life-threatening, sudden (acute) allergic reaction that involves the whole body is called anaphylaxis. CAUSES  "True" drug allergies occur when there is an allergic reaction to a medicine. This is caused by overactivity of the immune system. First, the body becomes sensitized. The immune system is triggered by your first exposure to the medicine. Following this first exposure, future exposure to the same medicine may be life-threatening. Almost any medicine can cause an allergic reaction. Common ones are:  Penicillin.  Sulfonamides (sulfa drugs).  Local anesthetics.  X-ray dyes that contain iodine. SYMPTOMS  Common symptoms of a minor allergic reaction are:  Swelling around the mouth.  An itchy red rash or hives.  Vomiting or diarrhea. Anaphylaxis can cause swelling of the mouth and throat. This makes it difficult to breathe and swallow. Severe reactions can be fatal within seconds, even after exposure to only a trace amount of the drug that causes the reaction. HOME CARE INSTRUCTIONS   If you are unsure of what caused your reaction, keep a diary of foods and medicines used. Include the symptoms that followed. Avoid anything that causes reactions.  You may want to follow up with an allergy specialist after the reaction has cleared in order to be tested to confirm the allergy. It is important to confirm  that your reaction is an allergy, not just a side effect to the medicine. If you have a true allergy to a medicine, this may prevent that medicine and related medicines from being given to you when you are very ill.  If you have hives or a rash:  Take medicines as directed by your caregiver.  You may use an over-the-counter antihistamine (diphenhydramine) as needed.  Apply cold compresses to the skin or take baths in cool water. Avoid hot baths or showers.  If you are severely allergic:  Continuous observation after a severe reaction may be needed. Hospitalization is often required.  Wear a medical alert bracelet or necklace stating your allergy.  You and your family must learn how to use an anaphylaxis kit or give an epinephrine injection to temporarily treat an emergency allergic reaction. If you have had a severe reaction, always carry your epinephrine injection or anaphylaxis kit with you. This can be lifesaving if you have a severe reaction.  Do not drive or perform tasks after treatment until the medicines used to treat your reaction have worn off, or until your caregiver says it is okay. SEEK MEDICAL CARE IF:   You think you had an allergic reaction. Symptoms usually start within 30 minutes after exposure.  Symptoms are getting worse rather than better.  You develop new symptoms.  The symptoms that brought you to your caregiver return. SEEK IMMEDIATE MEDICAL CARE IF:   You have swelling of the mouth, difficulty breathing, or wheezing.  You have a tight feeling in your chest or throat.  You develop hives, swelling, or itching all over your body.  You develop severe vomiting  or diarrhea.  You feel faint or pass out. This is an emergency. Use your epinephrine injection or anaphylaxis kit as you have been instructed. Call for emergency medical help. Even if you improve after the injection, you need to be examined at a hospital emergency department. MAKE SURE YOU:    Understand these instructions.  Will watch your condition.  Will get help right away if you are not doing well or get worse. Document Released: 06/13/2005 Document Revised: 09/05/2011 Document Reviewed: 11/17/2010 [facility] Patient Information 2015 [place], [place]. This information is not intended to replace advice given to you by your health care provider. Make sure you discuss any questions you have with your health care provider.  Possible Angioedema Angioedema is a sudden swelling of tissues, often of the skin. It can occur on the face or genitals or in the abdomen or other body parts. The swelling usually develops over a short period and gets better in 24 to 48 hours. It often begins during the night and is found when the person wakes up. The person may also get red, itchy patches of skin (hives). Angioedema can be dangerous if it involves swelling of the air passages.  Depending on the cause, episodes of angioedema may only happen once, come back in unpredictable patterns, or repeat for several years and then gradually fade away.  CAUSES  Angioedema can be caused by an allergic reaction to various triggers. It can also result from nonallergic causes, including reactions to drugs, immune system disorders, viral infections, or an abnormal gene that is passed to you from your parents (hereditary). For some people with angioedema, the cause is unknown.  Some things that can trigger angioedema include:   Foods.   Medicines, such as ACE inhibitors, ARBs, nonsteroidal anti-inflammatory agents, or estrogen.   Latex.   Animal saliva.   Insect stings.   Dyes used in X-rays.   Mild injury.   Dental work.  Surgery.  Stress.   Sudden changes in temperature.   Exercise. SIGNS AND SYMPTOMS   Swelling of the skin.  Hives. If these are present, there is also intense itching.  Redness in the affected area.   Pain in the affected area.  Swollen lips or  tongue.  Breathing problems. This may happen if the air passages swell.  Wheezing. If internal organs are involved, there may be:   Nausea.   Abdominal pain.   Vomiting.   Difficulty swallowing.   Difficulty passing urine. DIAGNOSIS   Your health care provider will examine the affected area and take a medical and family history.  Various tests may be done to help determine the cause. Tests may include:  Allergy skin tests to see if the problem is an allergic reaction.   Blood tests to check for hereditary angioedema.   Tests to check for underlying diseases that could cause the condition.   A review of your medicines, including over-the-counter medicines, may be done. TREATMENT  Treatment will depend on the cause of the angioedema. Possible treatments include:   Removal of anything that triggered the condition (such as stopping certain medicines).   Medicines to treat symptoms or prevent attacks. Medicines given may include:   Antihistamines.   Epinephrine injection.   Steroids.   Hospitalization may be required for severe attacks. If the air passages are affected, it can be an emergency. Tubes may need to be placed to keep the airway open. HOME CARE INSTRUCTIONS   Take all medicines as directed by your health care  provider.  If you were given medicines for emergency allergy treatment, always carry them with you.  Wear a medical bracelet as directed by your health care provider.   Avoid known triggers. SEEK MEDICAL CARE IF:   You have repeat attacks of angioedema.   Your attacks are more frequent or more severe despite preventive measures.   You have hereditary angioedema and are considering having children. It is important to discuss with your health care provider the risks of passing the condition on to your children. SEEK IMMEDIATE MEDICAL CARE IF:   You have severe swelling of the mouth, tongue, or lips.  You have difficulty breathing.    You have difficulty swallowing.   You faint. MAKE SURE YOU:  Understand these instructions.  Will watch your condition.  Will get help right away if you are not doing well or get worse. Document Released: 08/22/2001 Document Revised: 10/28/2013 Document Reviewed: 02/04/2013 [facility] Patient Information 2015 [place], [place]. This information is not intended to replace advice given to you by your health care provider. Make sure you discuss any questions you have with your health care provider.

## 2015-03-09 NOTE — ED Provider Notes (Signed)
CSN: [id number]     Arrival date & time 03/09/15  1949 History   First MD Initiated Contact with Patient 03/09/15 2040     Chief Complaint  Patient presents with  . Allergic Reaction     (Consider location/radiation/quality/duration/timing/severity/associated sxs/prior Treatment) HPI Patient has been started on clindamycin for a toe amputation that she had several days ago. She states that she started developing swelling in her lips and face as well as tightness in her throat. This started several hours prior to arrival. She reports she had an allergic reaction to medication once before, she states it was clonidine. She reports at that time her whole face became swollen. She is not having a difficulty breathing. She reports it does seem somewhat painful to swallow. She endorses some sensation of tightness in her chest not significant difficulty breathing. Past Medical History  Diagnosis Date  . Hypertension   . Anemia   . Neck strain 02/06/2012  . Positive TB test   . H/O hiatal hernia   . Chronic lower back pain   . Osteomyelitis     [name] 03/05/2015  . Hypothyroidism   . Hyperthyroidism     hx  . GERD (gastroesophageal reflux disease)   . Migraine headache     "a few times/yr" (03/05/2015)   Past Surgical History  Procedure Laterality Date  . Cesarean section  1979 1986 1990  . Appendectomy  2006  . Abdominal hysterectomy  2006  . Toe amputation Left 03/05/2015    5th toe  . Tubal ligation Bilateral 1990  . Amputation toe Left 03/05/2015    Procedure: AMPUTATION LEFT 5TH  TOE;  Surgeon: Wylene Simmer, MD;  Location: [facility] [place];  Service: Orthopedics;  Laterality: Left;   Family History  Problem Relation Age of Onset  . Heart disease Other   . Diabetes Other   . Heart disease Mother     CAB age 69  . Diabetes Mother   . Cancer Father 75    colon cancer  . Stroke Father   . Diabetes Sister   . Kidney disease Sister     renal failure  . Diabetes Brother   . Kidney disease  Brother     renal failure   Social History  Substance Use Topics  . Smoking status: Current Every Day Smoker -- 0.00 packs/day for 36 years    Types: Cigarettes  . Smokeless tobacco: Never Used  . Alcohol Use: No   OB History    No data available     Review of Systems  10 Systems reviewed and are negative for acute change except as noted in the HPI.   Allergies  Clonidine derivatives; Aspirin; and Latex  Home Medications   Prior to Admission medications   Medication Sig Start Date End Date Taking? Authorizing Provider  amLODipine (NORVASC) 10 MG tablet TAKE ONE TABLET BY MOUTH AT BEDTIME 09/08/14  Yes Kinnie Feil, MD  clindamycin (CLEOCIN) 300 MG capsule Take 1 capsule (300 mg total) by mouth 4 (four) times daily. 03/07/15  Yes Carly Montey Hora, MD  fluticasone (FLONASE) 50 MCG/ACT nasal spray Place 2 sprays into both nostrils daily. 08/06/14  Yes Hilton Sinclair, MD  Hypromellose (ARTIFICIAL TEARS OP) Place 1 drop into both eyes as needed (for dry eyes).   Yes Historical Provider, MD  levothyroxine (SYNTHROID, LEVOTHROID) 100 MCG tablet TAKE ONE TABLET BY MOUTH IN THE MORNING 10/22/14  Yes Kinnie Feil, MD  lisinopril-hydrochlorothiazide (PRINZIDE,ZESTORETIC) 20-25 MG per tablet  TAKE ONE TABLET BY MOUTH ONCE DAILY 09/08/14  Yes Kinnie Feil, MD  loratadine (CLARITIN) 10 MG tablet Take 10 mg by mouth daily.   Yes Historical Provider, MD  metoCLOPramide (REGLAN) 10 MG tablet Take 1 tablet (10 mg total) by mouth every 6 (six) hours as needed for nausea (nausea/headache). 08/07/14  Yes Mercedes Camprubi-Soms, PA-C  metoprolol tartrate (LOPRESSOR) 25 MG tablet TAKE ONE-HALF TABLET BY MOUTH TWICE DAILY 09/08/14  Yes Kinnie Feil, MD  Multiple Vitamin (MULTIVITAMIN WITH MINERALS) TABS tablet Take 1 tablet by mouth daily.   Yes Historical Provider, MD  oxyCODONE (OXY IR/ROXICODONE) 5 MG immediate release tablet Take 1-2 tablets (5-10 mg total) by mouth every 4 (four) hours as  needed for moderate pain or severe pain. 03/06/15  Yes Alyssa A Haney, MD  senna-docusate (SENOKOT-S) 8.6-50 MG per tablet Take 1 tablet by mouth at bedtime as needed for mild constipation. 03/06/15  Yes Alyssa A Lincoln Brigham, MD  diphenhydrAMINE (BENADRYL) 25 MG tablet Take 1 tablet (25 mg total) by mouth every 6 (six) hours as needed for sleep (with reglan for headaches). Patient not taking: Reported on 03/05/2015 08/07/14   Mercedes Camprubi-Soms, PA-C  diphenhydrAMINE (BENADRYL) 25 MG tablet Take 1 tablet (25 mg total) by mouth every 6 (six) hours. 03/09/15   Charlesetta Shanks, MD  famotidine (PEPCID) 20 MG tablet Take 1 tablet (20 mg total) by mouth 2 (two) times daily. 03/09/15   Charlesetta Shanks, MD  hydrOXYzine (ATARAX/VISTARIL) 10 MG tablet Take 1 tablet (10 mg total) by mouth 3 (three) times daily as needed. Patient not taking: Reported on 03/05/2015 01/05/15   Olin Hauser, DO  predniSONE (DELTASONE) 20 MG tablet 3 tabs po day one, then 2 po daily x 4 days 03/09/15   Charlesetta Shanks, MD   BP 128/72 mmHg  Pulse 69  Temp(Src) 98.3 F (36.8 C) (Oral)  Resp 25  SpO2 93% Physical Exam  Constitutional: She is oriented to person, place, and time. She appears well-developed and well-nourished.  HENT:  Head: Normocephalic and atraumatic.  Right Ear: External ear normal.  Left Ear: External ear normal.  Posterior oropharynx is widely patent upon arrival. No evident tongue swelling. Patient has swelling of her lips, moderate in nature. No stridor.  Eyes: EOM are normal. Pupils are equal, round, and reactive to light.  Neck: Neck supple.  Cardiovascular: Normal rate, regular rhythm, normal heart sounds and intact distal pulses.   Pulmonary/Chest: Effort normal and breath sounds normal.  Abdominal: Soft. Bowel sounds are normal. She exhibits no distension. There is no tenderness.  Musculoskeletal: Normal range of motion. She exhibits no edema.  Neurological: She is alert and oriented to person, place,  and time. She has normal strength. Coordination normal. GCS eye subscore is 4. GCS verbal subscore is 5. GCS motor subscore is 6.  Skin: Skin is warm, dry and intact.  Psychiatric: She has a normal mood and affect.    ED Course  Procedures (including critical care time) Labs Review Labs Reviewed - No data to display  Imaging Review No results found. I have personally reviewed and evaluated these images and lab results as part of my medical decision-making.   EKG Interpretation None     Recheck at 23:50 patient reports feeling much better. She feels no shortness of breath, chest discomfort or throat tightness. Swelling has improved significantly. Posterior oropharynx is widely patent. MDM   Final diagnoses:  Allergic reaction caused by a drug   Patient has recently  started clindamycin. This may be the cause of her allergic reaction. The reaction however was predominantly perioral in nature. As the patient is as well on lisinopril, she has been instructed to discontinue the lisinopril as well as the clindamycin. There is concern for possible angioedema. Patient however did not have tongue swelling and posterior oropharynx remained fully patent. At this time I feel patient is safe for discharge with instructions to discontinue both medications, take Benadryl and prednisone for the next several days and follow-up with her family physician this week. She is counseled on the necessity to return should swelling recur or increase.    Charlesetta Shanks, MD 03/10/15 [phone]

## 2015-03-10 LAB — CULTURE, BLOOD (ROUTINE X 2)
Culture: NO GROWTH
Culture: NO GROWTH

## 2015-03-11 ENCOUNTER — Encounter: Payer: Self-pay | Admitting: Internal Medicine

## 2015-03-11 ENCOUNTER — Ambulatory Visit (INDEPENDENT_AMBULATORY_CARE_PROVIDER_SITE_OTHER): Payer: No Typology Code available for payment source | Admitting: Internal Medicine

## 2015-03-11 VITALS — BP 155/92 | HR 108 | Temp 98.9°F | Wt 188.1 lb

## 2015-03-11 DIAGNOSIS — Z89422 Acquired absence of other left toe(s): Secondary | ICD-10-CM

## 2015-03-11 DIAGNOSIS — I10 Essential (primary) hypertension: Secondary | ICD-10-CM | POA: Diagnosis not present

## 2015-03-11 HISTORY — DX: Acquired absence of other left toe(s): Z89.422

## 2015-03-11 MED ORDER — METOPROLOL TARTRATE 25 MG PO TABS: 25.0000 mg | ORAL_TABLET | Freq: Two times a day (BID) | ORAL | Status: DC

## 2015-03-11 MED ORDER — SPIRONOLACTONE 25 MG PO TABS: 25.0000 mg | ORAL_TABLET | Freq: Every day | ORAL | Status: DC

## 2015-03-11 NOTE — Assessment & Plan Note (Signed)
Was taking Amlodipine 10mg  qd, Lisinopril-HCTZ 20-25mg  qd, Metoprolol tartrate 12.5mg  bid, and Spironolactone. Spironolactone was stopped while she was in the hospital. Pt was just recently seen in the ED with severe lip swelling, likely angioedema from the [place]. Pt instructed to stop taking this in the ED. BP 155/92 today. Pt was precepted with Dr. Nori Riis. - Advised Pt to continue taking Amlodipine 10mg  qd.  - Will increase Metoprolol tartrate from 12.5mg  bid to 25mg  bid - Will restart Spironolactone 25mg  qd today. Recheck BMET at next appointment. - Pt should stop taking Lisinopril-HCTZ, as this was the likely cause of her presumed angioedema - Pt to follow-up with PCP in the next few months for BP monitoring

## 2015-03-11 NOTE — Assessment & Plan Note (Signed)
Pt found to have osteomyelitis of L 5th toe after spider bite. Amputation on 9/8. Given Clindamycin x 7 days on discharge from hospital. Pain is well-controlled. Wound appears clean without signs of infection. - Continue taking Oxycodone 5mg  q4hrs PRN. No additional pills were given today. - Pt advised to stop Clindamycin, because she had lip swelling while taking it. She likely does not need any antibiotics because the source of infection has been removed. - Pt to follow-up with orthopedic surgeon for wound check and suture removal.

## 2015-03-11 NOTE — Patient Instructions (Signed)
It was nice to meet you! Thank you for coming into clinic today.  Your incision looks clean. You are doing a great job! We think that your allergic reaction was caused by the [place]. Please stop taking this medication. The Clindamycin (antibiotic) may have contributed to the allergic reaction, so you should stop taking this too.  You should restart the Spironolactone because your blood pressure was elevated today. You should increase the Metoprolol from 1/2 tablet twice a day to 1 tablet twice a day. You should continue to take the Amlodipine and the Levothyroxine like you normally do.  Please follow-up with Dr. Gwendlyn Deutscher so that she can continue to watch your blood pressure closely.  Please let us know if you have any questions or concerns.  -Dr. Brett Albino

## 2015-03-11 NOTE — Progress Notes (Signed)
[facility] Phone: [phone]  Subjective:  Hospital f/u after L 5th toe amputation: Teresa [name] is a 54 year old F who presents for a hospital follow-up visit. She was hospitalized after failing outpatient management of L foot cellulitis. While in the hospital she was found to have L 5th toe osteomyelitis and she underwent amputation on 9/8. She was discharged with Oxycodone for pain and Clindamycin x 7 days. She has been doing well since discharge. She states her pain has been well controlled and she has only had to take a few Oxycodone. She accidentally hit her L foot on her R foot last night, which caused her a great deal of pain. Her L foot is very achy today. She has had to take 3 Oxycodone pills since last night. She has a follow-up appointment scheduled on 9/23 with the orthopedic surgeon who did her amputation.  Hypertension: When she was discharged from the hospital, Teresa [name] was instructed to continue taking her Amlodipine 10mg  qd, Lisinopril-HCTZ 20-25mg  qd, and Metoprolol 12.5mg  bid. She was told to stop taking the Spironolactone because her blood pressures were well-controlled without Spironolactone in the hospital. Two days ago, Teresa [name] started having severe swelling of her lips and her throat felt like it was closing up. She went to the ED and was told to stop the Lisinopril-HCTZ and Clindamycin. She was given prescriptions for Prednisone, Benadryl, and Pepcid. She did not fill any of these prescriptions because she cannot afford them. She does not check her blood pressure at home. She has not had any chest pain, shortness of breath, or lightheadedness.  All other ROS were reviewed and are negative unless otherwise noted in the HPI. Past Medical History- significant for HTN, GERD, hypothyroidism  Reviewed problem list.  Medications- reviewed and updated Current Outpatient Prescriptions  Medication Sig Dispense Refill  . amLODipine (NORVASC) 10 MG tablet TAKE  ONE TABLET BY MOUTH AT BEDTIME 30 tablet 3  . diphenhydrAMINE (BENADRYL) 25 MG tablet Take 1 tablet (25 mg total) by mouth every 6 (six) hours as needed for sleep (with reglan for headaches). 10 tablet 0  . diphenhydrAMINE (BENADRYL) 25 MG tablet Take 1 tablet (25 mg total) by mouth every 6 (six) hours. 20 tablet 0  . famotidine (PEPCID) 20 MG tablet Take 1 tablet (20 mg total) by mouth 2 (two) times daily. 10 tablet 0  . fluticasone (FLONASE) 50 MCG/ACT nasal spray Place 2 sprays into both nostrils daily. 16 g 6  . hydrOXYzine (ATARAX/VISTARIL) 10 MG tablet Take 1 tablet (10 mg total) by mouth 3 (three) times daily as needed. 20 tablet 0  . Hypromellose (ARTIFICIAL TEARS OP) Place 1 drop into both eyes as needed (for dry eyes).    Marland Kitchen levothyroxine (SYNTHROID, LEVOTHROID) 100 MCG tablet TAKE ONE TABLET BY MOUTH IN THE MORNING 90 tablet 1  . loratadine (CLARITIN) 10 MG tablet Take 10 mg by mouth daily.    . metoCLOPramide (REGLAN) 10 MG tablet Take 1 tablet (10 mg total) by mouth every 6 (six) hours as needed for nausea (nausea/headache). 6 tablet 0  . metoprolol tartrate (LOPRESSOR) 25 MG tablet Take 1 tablet (25 mg total) by mouth 2 (two) times daily. 90 tablet 1  . Multiple Vitamin (MULTIVITAMIN WITH MINERALS) TABS tablet Take 1 tablet by mouth daily.    Marland Kitchen oxyCODONE (OXY IR/ROXICODONE) 5 MG immediate release tablet Take 1-2 tablets (5-10 mg total) by mouth every 4 (four) hours as needed for moderate pain or  severe pain. 30 tablet 0  . predniSONE (DELTASONE) 20 MG tablet 3 tabs po day one, then 2 po daily x 4 days 11 tablet 0  . senna-docusate (SENOKOT-S) 8.6-50 MG per tablet Take 1 tablet by mouth at bedtime as needed for mild constipation. 30 tablet 3  . spironolactone (ALDACTONE) 25 MG tablet Take 1 tablet (25 mg total) by mouth daily. 90 tablet 0   No current facility-administered medications for this visit.   Chief complaint-noted Family history reviewed for today's visit. No  changes. Social history- patient is a current smoker- 0.25 packs/day x 30 years  Objective: BP 155/92 mmHg  Pulse 108  Temp(Src) 98.9 F (37.2 C)  Wt 188 lb 1.6 oz (85.322 kg) Gen: NAD, alert, cooperative with exam HEENT: NCAT, EOMI, MMM CV: RRR, good S1/S2, no murmur, cap refill <3 Resp: CTABL, no wheezes, non-labored GI: SNTND, BS present Msk: Moves upper and lower extremities spontaneously; 4cm incision over area of L 5th toe, sutures are intact, dried blood is present with a very small amount of fresh blood, no purulent drainage, no surrounding erythema; mild edema noted in L foot. Neuro: Alert and oriented, no gross deficits Skin: no rashes, no lesions Psych: Appropriate behavior  Assessment/Plan: See problem based a/p   Hyman Bible, MD PGY-1

## 2015-03-18 ENCOUNTER — Ambulatory Visit: Payer: No Typology Code available for payment source | Admitting: Podiatry

## 2015-03-24 ENCOUNTER — Other Ambulatory Visit: Payer: Self-pay | Admitting: Family Medicine

## 2015-03-24 NOTE — Telephone Encounter (Signed)
Please advise patient to switch pain medicine to Ibuprofen at this time. No refill of oxycodone given.

## 2015-03-24 NOTE — Telephone Encounter (Signed)
Refill request for Oxycodone. Pls call patient for pickup.

## 2015-03-27 DIAGNOSIS — T7840XA Allergy, unspecified, initial encounter: Secondary | ICD-10-CM | POA: Insufficient documentation

## 2015-04-14 ENCOUNTER — Other Ambulatory Visit: Payer: Self-pay | Admitting: Family Medicine

## 2015-05-28 ENCOUNTER — Encounter: Payer: Self-pay | Admitting: Family Medicine

## 2015-05-28 ENCOUNTER — Ambulatory Visit (INDEPENDENT_AMBULATORY_CARE_PROVIDER_SITE_OTHER): Payer: No Typology Code available for payment source | Admitting: Family Medicine

## 2015-05-28 VITALS — BP 154/80 | HR 64 | Temp 98.8°F | Ht 65.0 in | Wt 190.0 lb

## 2015-05-28 DIAGNOSIS — Z23 Encounter for immunization: Secondary | ICD-10-CM | POA: Diagnosis not present

## 2015-05-28 DIAGNOSIS — M25512 Pain in left shoulder: Secondary | ICD-10-CM | POA: Insufficient documentation

## 2015-05-28 DIAGNOSIS — M542 Cervicalgia: Secondary | ICD-10-CM

## 2015-05-28 HISTORY — DX: Pain in left shoulder: M25.512

## 2015-05-28 MED ORDER — MELOXICAM 15 MG PO TABS: 15.0000 mg | ORAL_TABLET | Freq: Every day | ORAL | Status: DC

## 2015-05-28 MED ORDER — TIZANIDINE HCL 4 MG PO TABS: 4.0000 mg | ORAL_TABLET | Freq: Four times a day (QID) | ORAL | Status: DC | PRN

## 2015-05-28 MED ORDER — METHYLPREDNISOLONE ACETATE 40 MG/ML IJ SUSP
40.0000 mg | Freq: Once | INTRAMUSCULAR | Status: AC
  Administered 2015-05-28: 40 mg via INTRA_ARTICULAR

## 2015-05-28 NOTE — Assessment & Plan Note (Signed)
Could be contributing/cause of her shoulder and arm pain. Given what she calls chronic pain, with radicular type symptoms today may benefit from MRI. Will repeat x-rays of c-spine first and have her follow up if symptoms don't improve over next month.

## 2015-05-28 NOTE — Assessment & Plan Note (Signed)
Acute without apparent injury. Possibility of cervical radiculopathy with positive spurling, history of cervical neck pain and muscle spasm, however she does test positive for some rotator cuff pathology as well. Subacromial injection done today and she stated it was providing some relief before leaving. Switch her flexeril to zanaflex, add mobic. Follow up if not improving 1 month.

## 2015-05-28 NOTE — Patient Instructions (Signed)
Shoulder injection today.  If the pain is all from the neck, the shoulder injection likely won't help much.  We sent in new prescriptions:  Zanaflex (muscle relaxer), take in place of flexeril to see if it helps better with the spasms  Mobic (anti-inflammatory), take once a day with food. If it upsets your stomach too much you can cut it in half. Don't take ibuprofen or aleve in the same day as this  Ordered repeat neck x-rays, please go to one of the [facility] locations to get this done over the next few weeks if your pain is not improving.  If it is not better over the next month, come back to the clinic.

## 2015-05-28 NOTE — Progress Notes (Signed)
   Subjective:    Patient ID: Teresa [name], female    DOB: 1960-08-20, 54 y.o.   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 Pain does radiate down to the hand.  Has oxycodone she takes already, also tried some patches she bought at the pharmacy ROS: intermittent weakness of LUE, headaches, no rashes  Social Hx: current smoker  Review of Systems   See HPI for ROS. All other systems reviewed and are negative.  Past medical history, surgical, family, and social history reviewed and updated in the EMR as appropriate.  Objective:  BP 154/80 mmHg  Pulse 64  Temp(Src) 98.8 F (37.1 C) (Oral)  Ht 5\' 5"  (1.651 m)  Wt 190 lb (86.183 kg)  BMI 31.62 kg/m2 Vitals and nursing note reviewed  General: NAD Neck: TTP left neck, no muscle spasms noted today MSK: Shoulder exam: ROM actually largely intact except for internal rotation while adducted on the left. TTP more posteriorly. Empty can positive on left, Hawkins negative, Apley positive on left. Also has positive Spurling's test on left.  LEFT SHOULDER INJECTION: Patient was given informed consent, signed copy in the chart. Appropriate time out was taken. Area prepped and draped in usual sterile fashion. 1 cc of methylprednisolone 40 mg/ml plus  3 cc of 1% lidocaine without epinephrine was injected into the LEFT SHOULDER/SUBACROMIAL using a(n) POSTERIOR approach. The patient tolerated the procedure well. There were no complications. Post procedure instructions were given.   Assessment & Plan:  Left  shoulder pain Acute without apparent injury. Possibility of cervical radiculopathy with positive spurling, history of cervical neck pain and muscle spasm, however she does test positive for some rotator cuff pathology as well. Subacromial injection done today and she stated it was providing some relief before leaving. Switch her flexeril to zanaflex, add mobic. Follow up if not improving 1 month.  Neck pain Could be contributing/cause of her shoulder and arm pain. Given what she calls chronic pain, with radicular type symptoms today may benefit from MRI. Will repeat x-rays of c-spine first and have her follow up if symptoms don't improve over next month.

## 2015-05-31 ENCOUNTER — Other Ambulatory Visit: Payer: Self-pay | Admitting: Family Medicine

## 2015-08-13 ENCOUNTER — Ambulatory Visit (INDEPENDENT_AMBULATORY_CARE_PROVIDER_SITE_OTHER): Payer: BLUE CROSS/BLUE SHIELD | Admitting: Family Medicine

## 2015-08-13 ENCOUNTER — Ambulatory Visit (HOSPITAL_COMMUNITY)
Admission: RE | Admit: 2015-08-13 | Discharge: 2015-08-13 | Disposition: A | Discharge status: Home / Self Care | Payer: BLUE CROSS/BLUE SHIELD | Source: Ambulatory Visit | Attending: Family Medicine | Admitting: Family Medicine

## 2015-08-13 ENCOUNTER — Encounter: Payer: Self-pay | Admitting: Family Medicine

## 2015-08-13 VITALS — BP 163/80 | HR 65 | Temp 98.5°F | Wt 187.0 lb

## 2015-08-13 DIAGNOSIS — M542 Cervicalgia: Secondary | ICD-10-CM | POA: Diagnosis not present

## 2015-08-13 DIAGNOSIS — M25512 Pain in left shoulder: Secondary | ICD-10-CM

## 2015-08-13 DIAGNOSIS — M25562 Pain in left knee: Secondary | ICD-10-CM

## 2015-08-13 DIAGNOSIS — I6522 Occlusion and stenosis of left carotid artery: Secondary | ICD-10-CM | POA: Insufficient documentation

## 2015-08-13 DIAGNOSIS — J309 Allergic rhinitis, unspecified: Secondary | ICD-10-CM

## 2015-08-13 MED ORDER — AMLODIPINE BESYLATE 10 MG PO TABS: 10.0000 mg | ORAL_TABLET | Freq: Every day | ORAL | Status: DC

## 2015-08-13 MED ORDER — TRAMADOL-ACETAMINOPHEN 37.5-325 MG PO TABS: 1.0000 | ORAL_TABLET | Freq: Four times a day (QID) | ORAL | Status: DC | PRN

## 2015-08-13 MED ORDER — MOMETASONE FUROATE 50 MCG/ACT NA SUSP: 2.0000 | Freq: Every day | NASAL | Status: DC

## 2015-08-13 MED ORDER — GABAPENTIN 100 MG PO CAPS: 100.0000 mg | ORAL_CAPSULE | Freq: Two times a day (BID) | ORAL | Status: DC

## 2015-08-13 NOTE — Assessment & Plan Note (Signed)
Likely post-traumatic pain fro previous ankle twisting and ?? Knee fracture. I could not find the xray report that shows knee fracture. Xray ordered to reassess. Ultracet prescribed prn pain.

## 2015-08-13 NOTE — Assessment & Plan Note (Signed)
D/C Flonase. Nasonex prescribed.

## 2015-08-13 NOTE — Progress Notes (Signed)
Subjective:     Patient ID: Teresa [name], female   DOB: 1960-08-12, 55 y.o.   MRN: [id number]  Shoulder Pain  The pain is present in the left shoulder. This is a chronic (Left shoulder pain) problem. The current episode started more than 1 month ago (More than 4 months). There has been no history of extremity trauma. The problem occurs intermittently (worse at night after using her arm at work. She is left handed). The quality of the pain is described as aching (Associated with occasional numbness and tingling in her arms. Sometimes pain goes to her neck). The pain is at a severity of 4/10 (Pain can be more than 10/10 at night). The pain is moderate. Pertinent negatives include no fever, joint swelling, limited range of motion or stiffness. Associated symptoms comments: Tingling and numbness of her left arm intermittently. The symptoms are aggravated by activity. She has tried heat and cold (She used her sister's oxycodone) for the symptoms. The treatment provided mild relief. Family history does not include rheumatoid arthritis. There is no history of osteoarthritis.  Knee Pain  Incident onset: pain of her left knee started 3 months ago after she twisted her left ankle and later found out that she had fractured her knee bone. She stated she got an xray at the ED back in Nov of 2016 which confirmed the fracture. It was treated by wearing knee brac. The injury mechanism was a twisting injury. The pain is present in the left knee (She also has some pain on her left ankle.). The quality of the pain is described as aching. The pain is at a severity of 4/10. The pain is mild. The pain has been intermittent since onset. Associated symptoms comments: Almost fell 3 times late last year. No left leg numbness.. The symptoms are aggravated by movement and weight bearing. She has tried acetaminophen and NSAIDs for the symptoms. The treatment provided mild relief.  HTN: She is out of her Norvasc but took other  antihypertensive agents this morning. She also stated she has been undergoing stress by caring for her sister with cancer. Her sister is now on hospice care so she does not care for her as much anymore. Allergy: C/O allergic symptoms which started few days ago since there was a change in weather. Symptoms includes stuffy nose, post nasal drips, headache, cough. She denies fever, no SOB. She uses Flonase at home which seems to not help at all. Feels like Flonase worsens her symptoms.  Current Outpatient Prescriptions on File Prior to Visit  Medication Sig Dispense Refill  . fluticasone (FLONASE) 50 MCG/ACT nasal spray Place 2 sprays into both nostrils daily. 16 g 6  . levothyroxine (SYNTHROID, LEVOTHROID) 100 MCG tablet TAKE ONE TABLET BY MOUTH IN THE MORNING ONCE DAILY 90 tablet 1  . metoprolol tartrate (LOPRESSOR) 25 MG tablet Take 1 tablet (25 mg total) by mouth 2 (two) times daily. 90 tablet 1  . spironolactone (ALDACTONE) 25 MG tablet Take 1 tablet (25 mg total) by mouth daily. 90 tablet 0  . diphenhydrAMINE (BENADRYL) 25 MG tablet Take 1 tablet (25 mg total) by mouth every 6 (six) hours as needed for sleep (with reglan for headaches). (Patient not taking: Reported on 08/13/2015) 10 tablet 0  . Hypromellose (ARTIFICIAL TEARS OP) Place 1 drop into both eyes as needed (for dry eyes). Reported on 08/13/2015    . loratadine (CLARITIN) 10 MG tablet Take 10 mg by mouth daily. Reported on 08/13/2015    . Multiple  Vitamin (MULTIVITAMIN WITH MINERALS) TABS tablet Take 1 tablet by mouth daily. Reported on 08/13/2015    . tiZANidine (ZANAFLEX) 4 MG tablet Take 1 tablet (4 mg total) by mouth every 6 (six) hours as needed for muscle spasms. (Patient not taking: Reported on 08/13/2015) 30 tablet 0   No current facility-administered medications on file prior to visit.   Past Medical History  Diagnosis Date  . Hypertension   . Anemia   . Neck strain 02/06/2012  . Positive TB test   . H/O hiatal hernia   .  Chronic lower back pain   . Osteomyelitis ([place])     [name] 03/05/2015  . Hypothyroidism   . Hyperthyroidism     hx  . GERD (gastroesophageal reflux disease)   . Migraine headache     "a few times/yr" (03/05/2015)   Filed Vitals:   08/13/15 0909  BP: 163/80  Pulse: 65  Temp: 98.5 F (36.9 C)  TempSrc: Oral  Weight: 187 lb (84.823 kg)     Review of Systems  Constitutional: Negative for fever and fatigue.  HENT: Positive for postnasal drip. Negative for ear discharge, sinus pressure and trouble swallowing.   Respiratory: Negative.   Cardiovascular: Negative.   Gastrointestinal: Negative.   Musculoskeletal: Positive for arthralgias. Negative for stiffness.       Left shoulder, knee and ankle pain  Neurological: Positive for headaches.  All other systems reviewed and are negative.      Objective:   Physical Exam  Constitutional: She is oriented to person, place, and time. She appears well-developed. No distress.  HENT:  Mouth/Throat: Oropharynx is clear and moist. No oropharyngeal exudate.  Neck: No rigidity. Normal range of motion present. No thyromegaly present.    Cardiovascular: Normal rate, regular rhythm and normal heart sounds.   No murmur heard. Pulmonary/Chest: Effort normal and breath sounds normal. No respiratory distress.  Abdominal: Soft. Bowel sounds are normal. She exhibits no distension. There is no tenderness.  Musculoskeletal:       Left shoulder: She exhibits decreased range of motion and tenderness. She exhibits no swelling, no effusion, no crepitus, no deformity and no spasm.       Left knee: Normal.       Left ankle: Normal.       Legs: Neurological: She is alert and oriented to person, place, and time. She has normal reflexes. No cranial nerve deficit. Coordination normal.  Nursing note and vitals reviewed.      Assessment:     Left shoulder pain Left knee pain HTN Allergic rhinitis     Plan:     Check problem list.

## 2015-08-13 NOTE — Assessment & Plan Note (Signed)
??   Cervical nerve impingement syndrome vs osteoarthritis. No neurologic deficit. Xray of the cervical spine and left shoulder ordered. Gabapentin prescribed for paraesthetic pain. Ultracet prn for breakthrough pain. I will consider PT referral pending xray report. Return precaution discussed.

## 2015-08-13 NOTE — Patient Instructions (Signed)
Shoulder Pain The shoulder is the joint that connects your arm to your body. Muscles and band-like tissues that connect bones to muscles (tendons) hold the joint together. Shoulder pain is felt if an injury or medical problem affects one or more parts of the shoulder. HOME CARE   Put ice on the sore area.  Put ice in a plastic bag.  Place a towel between your skin and the bag.  Leave the ice on for 15-20 minutes, 03-04 times a day for the first 2 days.  Stop using cold packs if they do not help with the pain.  If you were given something to keep your shoulder from moving (sling; shoulder immobilizer), wear it as told. Only take it off to shower or bathe.  Move your arm as little as possible, but keep your hand moving to prevent puffiness (swelling).  Squeeze a soft ball or foam pad as much as possible to help prevent swelling.  Take medicine as told by your doctor. GET HELP IF:  You have progressing new pain in your arm, hand, or fingers.  Your hand or fingers get cold.  Your medicine does not help lessen your pain. GET HELP RIGHT AWAY IF:   Your arm, hand, or fingers are numb or tingling.  Your arm, hand, or fingers are puffy (swollen), painful, or turn white or blue. MAKE SURE YOU:   Understand these instructions.  Will watch your condition.  Will get help right away if you are not doing well or get worse.   This information is not intended to replace advice given to you by your health care provider. Make sure you discuss any questions you have with your health care provider.   Document Released: 11/30/2007 Document Revised: 07/04/2014 Document Reviewed: 10/06/2014 Elsevier Interactive Patient Education 2016 [employer]vier Inc.  

## 2015-08-14 ENCOUNTER — Telehealth: Payer: Self-pay | Admitting: Family Medicine

## 2015-08-14 ENCOUNTER — Ambulatory Visit: Payer: No Typology Code available for payment source | Admitting: Family Medicine

## 2015-08-14 DIAGNOSIS — M542 Cervicalgia: Secondary | ICD-10-CM

## 2015-08-14 DIAGNOSIS — M25562 Pain in left knee: Secondary | ICD-10-CM

## 2015-08-14 DIAGNOSIS — M25512 Pain in left shoulder: Secondary | ICD-10-CM

## 2015-08-14 MED ORDER — GABAPENTIN 100 MG PO CAPS: 100.0000 mg | ORAL_CAPSULE | Freq: Two times a day (BID) | ORAL | Status: DC

## 2015-08-14 MED ORDER — AMLODIPINE BESYLATE 10 MG PO TABS: 10.0000 mg | ORAL_TABLET | Freq: Every day | ORAL | Status: DC

## 2015-08-14 MED ORDER — MOMETASONE FUROATE 50 MCG/ACT NA SUSP: 2.0000 | Freq: Every day | NASAL | Status: DC

## 2015-08-14 NOTE — Telephone Encounter (Signed)
Medication resent to pharmacy of patient's choice. Jazmin Hartsell,CMA

## 2015-08-14 NOTE — Telephone Encounter (Signed)
Pt called because the medication we sent on 08/13/15 was not received by [facility]. Can we re-send them but use the [facility] on [address]. Teresa [name]

## 2015-08-14 NOTE — Telephone Encounter (Signed)
Xray report discussed with patient. No major acute findings. Mild arthritis of her left knee. For now continue pain meds and I will refer to PT. If symptom persist for 2-4 we will consider MRI of her neck and shoulder. She agreed with plan.

## 2015-09-02 ENCOUNTER — Telehealth: Payer: Self-pay | Admitting: Family Medicine

## 2015-09-02 DIAGNOSIS — G8929 Other chronic pain: Secondary | ICD-10-CM

## 2015-09-02 DIAGNOSIS — M25512 Pain in left shoulder: Principal | ICD-10-CM

## 2015-09-02 NOTE — Telephone Encounter (Signed)
Pt called and would like to go to have a MRI done. She didn't get to go to PT since at the same time her sister was in the last stages of lung cancer and wanted to spend that time with her. She is in a lot of pain and really wants to see what's going on. Please call her with any questions. jw

## 2015-09-02 NOTE — Telephone Encounter (Signed)
Which is causing her the most pain, shoulder, neck or knee pain. Is it the same location she discussed during last visit? Might be better for her to come in to see me for reassessment.

## 2015-09-03 NOTE — Telephone Encounter (Signed)
Order will be faxed to [facility] and they will contact patient to schedule. Mykeria Garman,CMA

## 2015-09-03 NOTE — Telephone Encounter (Signed)
Please inform patient MRI of her left shoulder has been ordered. Please help her schedule appointment.  NB: Remind her per documentation she complained of left shoulder pain and the MRI is for left shoulder.

## 2015-09-03 NOTE — Telephone Encounter (Signed)
Spoke with patient and she states that the pain is still mainly in her shoulder but causes pain in other locations.  Example today she is having some elbow pain.  She states that she has been wearing a compression sleeve and this seems to help some with the pain. Janya Eveland,CMA

## 2015-09-17 ENCOUNTER — Ambulatory Visit (HOSPITAL_COMMUNITY)
Admission: RE | Admit: 2015-09-17 | Discharge: 2015-09-17 | Disposition: A | Discharge status: Home / Self Care | Payer: BLUE CROSS/BLUE SHIELD | Source: Ambulatory Visit | Attending: Family Medicine | Admitting: Family Medicine

## 2015-09-17 ENCOUNTER — Telehealth: Payer: Self-pay | Admitting: Family Medicine

## 2015-09-17 DIAGNOSIS — G8929 Other chronic pain: Secondary | ICD-10-CM | POA: Diagnosis not present

## 2015-09-17 DIAGNOSIS — M25512 Pain in left shoulder: Secondary | ICD-10-CM | POA: Diagnosis not present

## 2015-09-17 DIAGNOSIS — M7582 Other shoulder lesions, left shoulder: Secondary | ICD-10-CM | POA: Insufficient documentation

## 2015-09-17 NOTE — Telephone Encounter (Signed)
I contacted patient to discuss her MRI report which shows rotator cuff tendinopathy. I recommended PT and NSAID and if no improvement intraarticular steroid injection. She stated she will do water aerobics and call PT for appointment. I already placed referral few weeks ago. F/U as needed.

## 2015-09-29 ENCOUNTER — Other Ambulatory Visit: Payer: Self-pay | Admitting: *Deleted

## 2015-09-29 ENCOUNTER — Other Ambulatory Visit: Payer: Self-pay

## 2015-09-29 DIAGNOSIS — I10 Essential (primary) hypertension: Secondary | ICD-10-CM

## 2015-09-29 DIAGNOSIS — Z1231 Encounter for screening mammogram for malignant neoplasm of breast: Secondary | ICD-10-CM

## 2015-09-29 MED ORDER — SPIRONOLACTONE 25 MG PO TABS: 25.0000 mg | ORAL_TABLET | Freq: Every day | ORAL | Status: DC

## 2015-09-29 NOTE — Telephone Encounter (Signed)
Pt is calling for a refill on her Spironolactone. She has been without this for a week. She said that the pharmacy has been calling for over a week. Can we get this in today. jw

## 2015-10-21 ENCOUNTER — Ambulatory Visit
Admission: RE | Admit: 2015-10-21 | Discharge: 2015-10-21 | Disposition: A | Discharge status: Home / Self Care | Payer: BLUE CROSS/BLUE SHIELD | Source: Ambulatory Visit

## 2015-10-21 DIAGNOSIS — Z1231 Encounter for screening mammogram for malignant neoplasm of breast: Secondary | ICD-10-CM

## 2015-10-27 ENCOUNTER — Ambulatory Visit (INDEPENDENT_AMBULATORY_CARE_PROVIDER_SITE_OTHER): Payer: BLUE CROSS/BLUE SHIELD | Admitting: Family Medicine

## 2015-10-27 ENCOUNTER — Other Ambulatory Visit (HOSPITAL_COMMUNITY)
Admission: RE | Admit: 2015-10-27 | Discharge: 2015-10-27 | Disposition: A | Discharge status: Home / Self Care | Payer: BLUE CROSS/BLUE SHIELD | Source: Ambulatory Visit | Attending: Family Medicine | Admitting: Family Medicine

## 2015-10-27 ENCOUNTER — Encounter: Payer: Self-pay | Admitting: Family Medicine

## 2015-10-27 VITALS — BP 151/83 | HR 69 | Temp 98.4°F | Ht 65.0 in | Wt 191.0 lb

## 2015-10-27 DIAGNOSIS — I10 Essential (primary) hypertension: Secondary | ICD-10-CM | POA: Diagnosis not present

## 2015-10-27 DIAGNOSIS — Z1151 Encounter for screening for human papillomavirus (HPV): Secondary | ICD-10-CM | POA: Insufficient documentation

## 2015-10-27 DIAGNOSIS — Z01419 Encounter for gynecological examination (general) (routine) without abnormal findings: Secondary | ICD-10-CM

## 2015-10-27 DIAGNOSIS — N898 Other specified noninflammatory disorders of vagina: Secondary | ICD-10-CM | POA: Diagnosis not present

## 2015-10-27 DIAGNOSIS — Z124 Encounter for screening for malignant neoplasm of cervix: Secondary | ICD-10-CM

## 2015-10-27 DIAGNOSIS — Z720 Tobacco use: Secondary | ICD-10-CM

## 2015-10-27 DIAGNOSIS — Z113 Encounter for screening for infections with a predominantly sexual mode of transmission: Secondary | ICD-10-CM | POA: Insufficient documentation

## 2015-10-27 DIAGNOSIS — E039 Hypothyroidism, unspecified: Secondary | ICD-10-CM

## 2015-10-27 DIAGNOSIS — M67912 Unspecified disorder of synovium and tendon, left shoulder: Secondary | ICD-10-CM

## 2015-10-27 DIAGNOSIS — Z13220 Encounter for screening for lipoid disorders: Secondary | ICD-10-CM | POA: Diagnosis not present

## 2015-10-27 DIAGNOSIS — N760 Acute vaginitis: Secondary | ICD-10-CM | POA: Insufficient documentation

## 2015-10-27 DIAGNOSIS — Z0000 Encounter for general adult medical examination without abnormal findings: Secondary | ICD-10-CM

## 2015-10-27 DIAGNOSIS — R631 Polydipsia: Secondary | ICD-10-CM | POA: Diagnosis not present

## 2015-10-27 DIAGNOSIS — E669 Obesity, unspecified: Secondary | ICD-10-CM

## 2015-10-27 LAB — LIPID PANEL
Cholesterol: 159 mg/dL (ref 125–200)
HDL: 47 mg/dL (ref >=46)
LDL Cholesterol: 105 mg/dL (ref <130)
Total CHOL/HDL Ratio: 3.4 Ratio (ref <=5.0)
Triglycerides: 37 mg/dL (ref <150)
VLDL: 7 mg/dL (ref <30)

## 2015-10-27 LAB — POCT WET PREP (WET MOUNT): Clue Cells Wet Prep Whiff POC: POSITIVE

## 2015-10-27 LAB — BASIC METABOLIC PANEL
BUN: 13 mg/dL (ref 7–25)
CO2: 23 mmol/L (ref 20–31)
Calcium: 9.3 mg/dL (ref 8.6–10.4)
Chloride: 104 mmol/L (ref 98–110)
Creat: 0.87 mg/dL (ref 0.50–1.05)
Glucose, Bld: 84 mg/dL (ref 65–99)
Potassium: 3.9 mmol/L (ref 3.5–5.3)
Sodium: 138 mmol/L (ref 135–146)

## 2015-10-27 LAB — POCT GLYCOSYLATED HEMOGLOBIN (HGB A1C): Hemoglobin A1C: 6.2

## 2015-10-27 LAB — TSH: TSH: 10.05 mIU/L — ABNORMAL HIGH

## 2015-10-27 MED ORDER — METOPROLOL TARTRATE 25 MG PO TABS: 25.0000 mg | ORAL_TABLET | Freq: Two times a day (BID) | ORAL | Status: DC

## 2015-10-27 MED ORDER — METRONIDAZOLE 500 MG PO TABS: 500.0000 mg | ORAL_TABLET | Freq: Two times a day (BID) | ORAL | Status: DC

## 2015-10-27 MED ORDER — AMLODIPINE BESYLATE 10 MG PO TABS: 10.0000 mg | ORAL_TABLET | Freq: Every day | ORAL | Status: DC

## 2015-10-27 MED ORDER — METOPROLOL SUCCINATE ER 50 MG PO TB24: 50.0000 mg | ORAL_TABLET | Freq: Every day | ORAL | Status: DC

## 2015-10-27 MED ORDER — VARENICLINE TARTRATE 0.5 MG X 11 & 1 MG X 42 PO MISC: ORAL | Status: DC

## 2015-10-27 NOTE — Assessment & Plan Note (Signed)
Stable on home shoulder exercise and Ultram. She will reschedule PT appointment as soon as she can.

## 2015-10-27 NOTE — Assessment & Plan Note (Signed)
S/P hysterectomy for fibroid. Concern about high risk of cancer in her family. As discussed with her, we can get PAP done on her vaginal vault. If normal, no further PAP is warranted. She agreed with plan. PAP completed today. I will contact her with test result.

## 2015-10-27 NOTE — Assessment & Plan Note (Signed)
Counseling done today. She was started on Chantix. Will follow.

## 2015-10-27 NOTE — Assessment & Plan Note (Signed)
TSH checked today. Continue Synthroid 100 mcg pending test result.

## 2015-10-27 NOTE — Assessment & Plan Note (Addendum)
BP not well controlled. She also have BP discrepancy in her arms. With hx of shoulder pain which I feel is related to her rotator cuff tendinitis but could also be symptoms of PAD. Will obtain carotid doppler to r/o subclavian stenosis. I also switch her from Metoprolol 25 mg BID to Toprol XL 50 mg qd in addition to other BP meds. Bmet and FLP ( Hyperlipidemia screening) done today. Continue home BP and HR monitoring. Return precaution discussed.

## 2015-10-27 NOTE — Assessment & Plan Note (Signed)
At this time I do not recommend starting weight loss meds. I recommended nutritionist counseling which she agreed on. I referred her to a nutritionist and gave her appointment scheduling information. Monitor for improvement on exercise and diet.

## 2015-10-27 NOTE — Patient Instructions (Signed)
It was nice seeing you today. Your BP is still a bit elevated. I will go up on your Metoprolol. Also I will like to get an ultrasound of your neck blood vessel to ensure patency especially with your BP discrepancy. I will like you to see me back in 2-4 wks for reassessment. In the mean time continue to monitor your BP and HR at home.

## 2015-10-27 NOTE — Assessment & Plan Note (Addendum)
Etiology unclear. R/O DM. A1C checked today was 6.2 ( Pre-diabetic). Result discussed with patient. Diet and exercise control recommended for weight loss. I also referred her to nutritionist and get her number to call for an appointment. Monitor symptoms for now.

## 2015-10-27 NOTE — Progress Notes (Signed)
Subjective:     Patient ID: Teresa [name], female   DOB: 1961/02/22, 55 y.o.   MRN: [id number]  HPI  PAP/Vaginitis: Here for Gyn exam. She had hysterectomy in 2007 for fibroid. Denies any bleeding but does have thick vaginal discharge. She is concern because of high risk for cancer in her family. Her dad had cancer, her sister was recently diagnosed with lung cancer. She is also a chronic smoker.  HTN: here for follow up. Compliant with meds, but stated her BP has been fine at other doctors office. No N/V, occasional headache due to her sinus headache. She is compliant with aldactone 25 mg qd, Metoprolol 25 mg BID and Norvasc 10 mg qd. Denies any other concern. Hypothyroid: Compliant with Syntrhoid 100 mcg. Here for follow up. No hypo or hyperthyroid symptoms. Polydipsia: She has been drinking a lot of water and urinating a lot in the last week. Feels like her skin is dry. Denies any other concern. Left shoulder: Left shoulder pain improved on home exercise. She could not attend PT due to her work schedule. She uses Ultram as needed. Smoke:She continues to smoke 1 PPD of tobacco product. 2 years ago she cut back on smoking on Nicotine lozenges, she had used chantix in the past as well which worked well for her. She is now needing help with quiting.  Weight :Patient is concern about her weight. She has been on diet control and exercise and yet no improvement. She does not want to use OTC weight loss medication. She is here to discuss her options. HM: Here for follow-up on her health in general. She has not gotten her Hep C, HIV testing done. She just started having sex again with a partner she had been with in the past.  Current Outpatient Prescriptions on File Prior to Visit  Medication Sig Dispense Refill  . Hypromellose (ARTIFICIAL TEARS OP) Place 1 drop into both eyes as needed (for dry eyes). Reported on 08/13/2015    . levothyroxine (SYNTHROID, LEVOTHROID) 100 MCG tablet TAKE ONE TABLET BY MOUTH  IN THE MORNING ONCE DAILY 90 tablet 1  . spironolactone (ALDACTONE) 25 MG tablet Take 1 tablet (25 mg total) by mouth daily. 90 tablet 0  . loratadine (CLARITIN) 10 MG tablet Take 10 mg by mouth daily. Reported on 10/27/2015    . Multiple Vitamin (MULTIVITAMIN WITH MINERALS) TABS tablet Take 1 tablet by mouth daily. Reported on 10/27/2015    . tiZANidine (ZANAFLEX) 4 MG tablet Take 1 tablet (4 mg total) by mouth every 6 (six) hours as needed for muscle spasms. (Patient not taking: Reported on 08/13/2015) 30 tablet 0  . traMADol-acetaminophen (ULTRACET) 37.5-325 MG tablet Take 1 tablet by mouth every 6 (six) hours as needed for moderate pain or severe pain. (Patient not taking: Reported on 10/27/2015) 90 tablet 1   No current facility-administered medications on file prior to visit.   Past Medical History  Diagnosis Date  . Hypertension   . Anemia   . Neck strain 02/06/2012  . Positive TB test   . H/O hiatal hernia   . Chronic lower back pain   . Osteomyelitis ([place])     [name] 03/05/2015  . Hypothyroidism   . Hyperthyroidism     hx  . GERD (gastroesophageal reflux disease)   . Migraine headache     "a few times/yr" (03/05/2015)  . Osteomyelitis of toe of left foot ([place]) 03/05/2015  . History of amputation of lesser toe of left foot ([place]) 03/11/2015  Filed Vitals:   10/27/15 0902 10/27/15 0925 10/27/15 0926  BP: 171/84 174/84 151/83  Pulse: 69    Temp: 98.4 F (36.9 C)    TempSrc: Oral    Height: 5\' 5"  (1.651 m)    Weight: 191 lb (86.637 kg)    SpO2: 100%        Review of Systems  Constitutional: Negative for fatigue.  Respiratory: Negative.   Cardiovascular: Negative.   Gastrointestinal: Negative.   Endocrine: Positive for polydipsia and polyuria.  Genitourinary: Positive for vaginal discharge. Negative for dysuria.  Musculoskeletal: Positive for arthralgias.       Left shoulder pain  Neurological: Negative.   All other systems reviewed and are negative.      Objective:    Physical Exam  Constitutional: She is oriented to person, place, and time. She appears well-developed. No distress.  Neck: Neck supple. No thyromegaly present.  Cardiovascular: Normal rate, regular rhythm and normal heart sounds.   No murmur heard. Pulmonary/Chest: Effort normal and breath sounds normal. No respiratory distress. She has no wheezes.  Abdominal: Soft. She exhibits no distension and no mass. There is no tenderness. There is no guarding.  Genitourinary: Vagina normal. No labial fusion. There is no rash or tenderness on the right labia. There is no rash or tenderness on the left labia. Right adnexum displays no mass, no tenderness and no fullness. Left adnexum displays no mass, no tenderness and no fullness.    Musculoskeletal: Normal range of motion. She exhibits no edema.       Right shoulder: Normal.       Left shoulder: Normal.       Cervical back: Normal.  Neurological: She is alert and oriented to person, place, and time. No cranial nerve deficit.  Psychiatric: She has a normal mood and affect.  Nursing note and vitals reviewed.      Assessment:     Gyne exam Vaginitis HTN Hypothyroidism Polydipsia Left shoulder pain: Rotator cuff tendinitis Smoking Obesity Health maintenance     Plan:     Check problem list.

## 2015-10-27 NOTE — Assessment & Plan Note (Signed)
HIV and Hep C testing offered. Test done with patient's consent.

## 2015-10-27 NOTE — Assessment & Plan Note (Signed)
Wet prep + for BV. Patient informed of test result. I e-scribed Metronidazole to her pharmacy. GC/Chlamydia checked today given new sexual partner ( who is not really new to her). She started having sex again with him recently after many years. I will contact her with result.

## 2015-10-28 ENCOUNTER — Telehealth: Payer: Self-pay | Admitting: Family Medicine

## 2015-10-28 DIAGNOSIS — I10 Essential (primary) hypertension: Secondary | ICD-10-CM

## 2015-10-28 LAB — CERVICOVAGINAL ANCILLARY ONLY
Chlamydia: NEGATIVE
Neisseria Gonorrhea: NEGATIVE

## 2015-10-28 LAB — HEPATITIS C ANTIBODY: HCV Ab: NEGATIVE

## 2015-10-28 LAB — HIV ANTIBODY (ROUTINE TESTING W REFLEX): HIV 1&2 Ab, 4th Generation: NONREACTIVE

## 2015-10-28 MED ORDER — LEVOTHYROXINE SODIUM 112 MCG PO TABS: 112.0000 ug | ORAL_TABLET | Freq: Every day | ORAL | Status: DC

## 2015-10-28 MED ORDER — SPIRONOLACTONE 25 MG PO TABS: 25.0000 mg | ORAL_TABLET | Freq: Every day | ORAL | Status: DC

## 2015-10-28 NOTE — Telephone Encounter (Signed)
Call back soon.  Note: in case she calls back (her gonorrhea and chlamydia test are both negative). PAP still pending. Please let her know this. Thank you.

## 2015-10-28 NOTE — Telephone Encounter (Signed)
I called and discussed lab result with her. K+ is normal. I will go ahead and refill her aldactone. TSH is elevated to 10 from 3 a year ago. As discussed with her, I will increase her synthroid to 112 from 100 mcg. I reaffirm that she has been compliant with her meds prior to testing.

## 2015-10-29 ENCOUNTER — Encounter: Payer: Self-pay | Admitting: Family Medicine

## 2015-10-29 DIAGNOSIS — R7303 Prediabetes: Secondary | ICD-10-CM | POA: Insufficient documentation

## 2015-10-29 LAB — CYTOLOGY - PAP

## 2015-10-29 NOTE — Telephone Encounter (Signed)
I discussed negative GC/Chlamydia and PAP with patient. She does not need PAP screening any more unless having symptoms. S/P hysterectomy for benign reason.  I also advise her to return in 3 months for recheck A1C. She agreed with plan.  Patient already scheduled nutritionist appointment.  She is yet to get a call to schedule her carotid doppler U/S. I will send message to staff to schedule it.   [facility] please schedule carotid doppler U/S for patient. Thank you.

## 2015-11-09 ENCOUNTER — Other Ambulatory Visit: Payer: Self-pay | Admitting: Family Medicine

## 2015-11-09 ENCOUNTER — Ambulatory Visit: Payer: BLUE CROSS/BLUE SHIELD | Admitting: Family Medicine

## 2015-11-09 ENCOUNTER — Telehealth: Payer: Self-pay | Admitting: Family Medicine

## 2015-11-09 DIAGNOSIS — I10 Essential (primary) hypertension: Secondary | ICD-10-CM

## 2015-11-09 MED ORDER — CLINDAMYCIN HCL 300 MG PO CAPS: 300.0000 mg | ORAL_CAPSULE | Freq: Two times a day (BID) | ORAL | Status: DC

## 2015-11-09 NOTE — Telephone Encounter (Signed)
Need to send a different medication for B/V.  Patient having reaction to medication.  And also need to contact patient regarding where to go and when for ultrasound.

## 2015-11-09 NOTE — Telephone Encounter (Signed)
I sent in Clindamycin 300mg  BID. She can pick it up at the pharmacy. To stop Metronidazole since she is allergic to it. Message send to nursing staff to contact patient.

## 2015-11-09 NOTE — Telephone Encounter (Signed)
Please contact patient to schedule carotid U/S. Also let her know that I sent in Clindamycin 300mg  BID. She can pick it up at the pharmacy.  Hav her stop Metronidazole since she is allergic to it.

## 2015-11-10 NOTE — Telephone Encounter (Signed)
Spoke with patient and informed her of appt date and time.  Also she is aware of med change. Ved Martos,CMA

## 2015-11-11 ENCOUNTER — Ambulatory Visit (HOSPITAL_COMMUNITY)
Admission: RE | Admit: 2015-11-11 | Discharge: 2015-11-11 | Disposition: A | Discharge status: Home / Self Care | Payer: BLUE CROSS/BLUE SHIELD | Source: Ambulatory Visit | Attending: Family Medicine | Admitting: Family Medicine

## 2015-11-11 ENCOUNTER — Telehealth: Payer: Self-pay | Admitting: Family Medicine

## 2015-11-11 DIAGNOSIS — I6523 Occlusion and stenosis of bilateral carotid arteries: Secondary | ICD-10-CM | POA: Diagnosis not present

## 2015-11-11 DIAGNOSIS — I10 Essential (primary) hypertension: Secondary | ICD-10-CM

## 2015-11-11 NOTE — Telephone Encounter (Signed)
HIPPA compliant call back message left. 

## 2015-11-11 NOTE — Progress Notes (Signed)
VASCULAR LAB PRELIMINARY  PRELIMINARY  PRELIMINARY  PRELIMINARY  Carotid duplex  completed.    Preliminary report:  Bilateral:  1-39% ICA stenosis.  Vertebral artery flow is antegrade.  Right BP 155/95  Left BP 147/83.    Fady Stamps, RVT 11/11/2015, 1:07 PM

## 2015-11-18 ENCOUNTER — Ambulatory Visit (INDEPENDENT_AMBULATORY_CARE_PROVIDER_SITE_OTHER): Payer: BLUE CROSS/BLUE SHIELD | Admitting: Family Medicine

## 2015-11-18 ENCOUNTER — Encounter: Payer: Self-pay | Admitting: Family Medicine

## 2015-11-18 VITALS — Ht 65.0 in | Wt 192.6 lb

## 2015-11-18 DIAGNOSIS — E669 Obesity, unspecified: Secondary | ICD-10-CM | POA: Diagnosis not present

## 2015-11-18 DIAGNOSIS — R7303 Prediabetes: Secondary | ICD-10-CM | POA: Diagnosis not present

## 2015-11-18 NOTE — Patient Instructions (Addendum)
-   When you come in next time, please bring the dosages of your B12 and biotin.     Diet Recommendations for Preventing Diabetes  To prevent diabetes, we need to control blood glucose (sugar).  We do this by eating the right diet and sometimes with medication.    ALL carbohydrate we eat increases blood glucose.  Carbohydrate includes sugar, starch, and fiber.  Sugar and starch are the only carbohydrates that raise blood glucose.    Starchy (carb) foods: Bread, rice, pasta, potatoes, corn, cereal, grits, crackers, bagels, muffins, all sweets and baked goods.  (Fruits, milk, and yogurt also have carbohydrate, but most of these foods will not spike your blood sugar as the starchy foods will.)  A few fruits do cause high blood sugars; use small portions of bananas (limit to 1/2 at a time), grapes, watermelon, oranges, and most tropical fruits.  Canned fruit WILL drive up blood glucose levels because of the juice or syrup it's in.  EAT YOUR FRUIT; DO NOT DRINK IT!  Fruit juice spikes blood glucose!  Protein foods: Meat, fish, poultry, eggs, dairy foods, and beans such as pinto and kidney beans (beans also provide carbohydrate).   1. Eat at least 3 meals and 1-2 snacks per day. Never go more than 4-5 hours while awake without eating. Eat breakfast within the first hour of getting up.    A REAL meal includes a protein food, starch food, and veg's and/or fruit.  2. Limit starchy foods to TWO per meal and ONE per snack. ONE portion of a starchy food is equal to the following:   - ONE slice of bread (or its equivalent, such as half of a hamburger bun).   - 1/2 cup of a "scoopable" starchy food such as potatoes or rice.   - 15 grams of carbohydrate as shown on food label.  3. Include at every meal: a protein food, a carb food, and vegetables and/or fruit.  - Obtain twice the volume of veg's as protein or carbohydrate foods for both lunch and   dinner.   - Fresh or frozen veg's are best.   - Keep frozen  veg's on hand for a quick vegetable serving.      - AT FOLLOW-UP, LET'S TALK ABOUT HOW YOUR POPCORN CAN FIT INTO YOUR EATING ROUTINE.

## 2015-11-18 NOTE — Progress Notes (Signed)
Medical Nutrition Therapy:  Appt start time: 1200 end time:  1300.  Assessment:  Primary concerns today: Weight management and Blood sugar control.   Teresa [name] was recently diagnosed with pre-DM.  She is concerned that the diet she needs for her thyroid problem conflicts with what she needs to do for her blood glucose control.  She is also trying to quit smoking, and wants to lose some weight.  Her sister died from lung cancer in Aug 08, 2022, which is a real motivator for her to make changes.   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  She works different days and times each week.  She lives alone, so does not cook often, but recognizes that she needs to do more home preparation of foods.  She had lost 10 lb when eating better and exercising regularly before her sister got sick.    Learning Readiness: Ready    Usual eating pattern includes 2 meals and 1 snack per day. Frequent foods and beverages include water, popcorn (daily), Naked Drink (green Machine), soda/sweet tea ~3 X wk, chx, fish, fast food.  Avoided foods include red meat, pork, bell peppers, cabbage, spaghetti sauce (abd pain).   Usual physical activity includes none.  Was exercising at the gym regularly, but had a toe amputated in September, so stopped exercise at that time.  She thinks the toe infection started from an insect bite.    24-hr recall: (Up at 7 AM; took levothyroxine) B (8:45 AM)-   [employer] oatmeal (49 g CHO) Snk ( AM)-   1 c coffee, 2 Equals, 2 T cream L (1 PM)-  1/2 bbq sandw, 1 c ff's, 1/4 c coleslaw, 16 oz swt tea,  Snk ( PM)-  water D (6:30 PM)-  1/2 bbq sandw, 1/2 c ff's, 1 ice crm sandwich Snk (9 PM)-  1 microwave bag Pop Secret p'corn, water Typical day? No.  Usually she does not get bkfst; lunch is often a salad lately; sometimes skips dinner.    Progress Towards Goal(s):  In progress.   Nutritional Diagnosis:  NI-5.8.3 Inappropriate intake of types of carbohydrates (specify):  simple carb's As related to juice, SSB, and starchy foods.  As evidenced by intake of [place] fries, popcorn, and sweet beverages most days of the week.    Intervention:  Nutrition education.  Handouts given during visit include:  AVS  Demonstrated degree of understanding via:  Teach Back  Barriers to learning/adherence to lifestyle change: customary eating practices, i.e., nightly popcorn.   Monitoring/Evaluation:  Dietary intake, exercise, and body weight in 4 week(s).

## 2015-12-10 ENCOUNTER — Ambulatory Visit (HOSPITAL_COMMUNITY)
Admission: EM | Admit: 2015-12-10 | Discharge: 2015-12-10 | Disposition: A | Discharge status: Nursing Home (Includes ICF) | Payer: BLUE CROSS/BLUE SHIELD | Attending: Emergency Medicine | Admitting: Emergency Medicine

## 2015-12-10 ENCOUNTER — Encounter (HOSPITAL_COMMUNITY): Payer: Self-pay | Admitting: Emergency Medicine

## 2015-12-10 ENCOUNTER — Encounter (HOSPITAL_COMMUNITY): Payer: Self-pay

## 2015-12-10 ENCOUNTER — Emergency Department (HOSPITAL_COMMUNITY)
Admission: EM | Admit: 2015-12-10 | Discharge: 2015-12-10 | Disposition: A | Discharge status: Home / Self Care | Payer: BLUE CROSS/BLUE SHIELD | Attending: Emergency Medicine | Admitting: Emergency Medicine

## 2015-12-10 DIAGNOSIS — G43909 Migraine, unspecified, not intractable, without status migrainosus: Secondary | ICD-10-CM

## 2015-12-10 DIAGNOSIS — R0981 Nasal congestion: Secondary | ICD-10-CM | POA: Diagnosis present

## 2015-12-10 DIAGNOSIS — Z91040 Latex allergy status: Secondary | ICD-10-CM | POA: Insufficient documentation

## 2015-12-10 DIAGNOSIS — I10 Essential (primary) hypertension: Secondary | ICD-10-CM | POA: Diagnosis not present

## 2015-12-10 DIAGNOSIS — R51 Headache: Secondary | ICD-10-CM

## 2015-12-10 DIAGNOSIS — F17210 Nicotine dependence, cigarettes, uncomplicated: Secondary | ICD-10-CM | POA: Diagnosis not present

## 2015-12-10 DIAGNOSIS — J0100 Acute maxillary sinusitis, unspecified: Secondary | ICD-10-CM | POA: Diagnosis not present

## 2015-12-10 DIAGNOSIS — M436 Torticollis: Secondary | ICD-10-CM

## 2015-12-10 DIAGNOSIS — H53149 Visual discomfort, unspecified: Secondary | ICD-10-CM

## 2015-12-10 DIAGNOSIS — R112 Nausea with vomiting, unspecified: Secondary | ICD-10-CM

## 2015-12-10 DIAGNOSIS — H5319 Other subjective visual disturbances: Secondary | ICD-10-CM | POA: Diagnosis not present

## 2015-12-10 DIAGNOSIS — R519 Headache, unspecified: Secondary | ICD-10-CM

## 2015-12-10 LAB — CBC WITH DIFFERENTIAL/PLATELET
Basophils Absolute: 0.1 10*3/uL (ref 0.0–0.1)
Basophils Relative: 1 %
Eosinophils Absolute: 0.4 10*3/uL (ref 0.0–0.7)
Eosinophils Relative: 4 %
HCT: 40.5 % (ref 36.0–46.0)
Hemoglobin: 13 g/dL (ref 12.0–15.0)
Lymphocytes Relative: 41 %
Lymphs Abs: 3.4 10*3/uL (ref 0.7–4.0)
MCH: 26.2 pg (ref 26.0–34.0)
MCHC: 32.1 g/dL (ref 30.0–36.0)
MCV: 81.5 fL (ref 78.0–100.0)
Monocytes Absolute: 0.5 10*3/uL (ref 0.1–1.0)
Monocytes Relative: 6 %
Neutro Abs: 4 10*3/uL (ref 1.7–7.7)
Neutrophils Relative %: 48 %
Platelets: 272 10*3/uL (ref 150–400)
RBC: 4.97 MIL/uL (ref 3.87–5.11)
RDW: 13.6 % (ref 11.5–15.5)
WBC: 8.4 10*3/uL (ref 4.0–10.5)

## 2015-12-10 LAB — COMPREHENSIVE METABOLIC PANEL
ALT: 19 U/L (ref 14–54)
AST: 16 U/L (ref 15–41)
Albumin: 3.6 g/dL (ref 3.5–5.0)
Alkaline Phosphatase: 71 U/L (ref 38–126)
Anion gap: 4 — ABNORMAL LOW (ref 5–15)
BUN: 12 mg/dL (ref 6–20)
CO2: 26 mmol/L (ref 22–32)
Calcium: 9.2 mg/dL (ref 8.9–10.3)
Chloride: 109 mmol/L (ref 101–111)
Creatinine, Ser: 0.94 mg/dL (ref 0.44–1.00)
GFR calc Af Amer: >60 mL/min (ref >60)
GFR calc non Af Amer: >60 mL/min (ref >60)
Glucose, Bld: 94 mg/dL (ref 65–99)
Potassium: 3.4 mmol/L — ABNORMAL LOW (ref 3.5–5.1)
Sodium: 139 mmol/L (ref 135–145)
Total Bilirubin: 0.2 mg/dL — ABNORMAL LOW (ref 0.3–1.2)
Total Protein: 6.7 g/dL (ref 6.5–8.1)

## 2015-12-10 MED ORDER — METHOCARBAMOL 500 MG PO TABS
1000.0000 mg | ORAL_TABLET | Freq: Once | ORAL | Status: AC
  Administered 2015-12-10: 1000 mg via ORAL
  Filled 2015-12-10: qty 2

## 2015-12-10 MED ORDER — METOCLOPRAMIDE HCL 5 MG/ML IJ SOLN
10.0000 mg | Freq: Once | INTRAMUSCULAR | Status: AC
  Administered 2015-12-10: 10 mg via INTRAVENOUS
  Filled 2015-12-10: qty 2

## 2015-12-10 MED ORDER — ONDANSETRON HCL 4 MG/2ML IJ SOLN
4.0000 mg | Freq: Once | INTRAMUSCULAR | Status: AC
  Administered 2015-12-10: 4 mg via INTRAVENOUS

## 2015-12-10 MED ORDER — IBUPROFEN 600 MG PO TABS: 600.0000 mg | ORAL_TABLET | Freq: Three times a day (TID) | ORAL | Status: DC

## 2015-12-10 MED ORDER — METHOCARBAMOL 500 MG PO TABS: 1000.0000 mg | ORAL_TABLET | Freq: Three times a day (TID) | ORAL | Status: DC | PRN

## 2015-12-10 MED ORDER — KETOROLAC TROMETHAMINE 30 MG/ML IJ SOLN
30.0000 mg | Freq: Once | INTRAMUSCULAR | Status: AC
  Administered 2015-12-10: 30 mg via INTRAVENOUS
  Filled 2015-12-10: qty 1

## 2015-12-10 MED ORDER — DIPHENHYDRAMINE HCL 50 MG/ML IJ SOLN
25.0000 mg | Freq: Once | INTRAMUSCULAR | Status: AC
  Administered 2015-12-10: 25 mg via INTRAVENOUS
  Filled 2015-12-10: qty 1

## 2015-12-10 MED ORDER — ONDANSETRON HCL 4 MG/2ML IJ SOLN
INTRAMUSCULAR | Status: AC
  Filled 2015-12-10: qty 2

## 2015-12-10 MED ORDER — METHYLPREDNISOLONE SODIUM SUCC 125 MG IJ SOLR
125.0000 mg | Freq: Once | INTRAMUSCULAR | Status: AC
  Administered 2015-12-10: 125 mg via INTRAVENOUS
  Filled 2015-12-10: qty 2

## 2015-12-10 MED ORDER — METOCLOPRAMIDE HCL 10 MG PO TABS: 10.0000 mg | ORAL_TABLET | Freq: Four times a day (QID) | ORAL | Status: DC | PRN

## 2015-12-10 MED ORDER — SODIUM CHLORIDE 0.9 % IV SOLN
Freq: Once | INTRAVENOUS | Status: AC
  Administered 2015-12-10: 20:00:00 via INTRAVENOUS

## 2015-12-10 MED ORDER — MOMETASONE FUROATE 50 MCG/ACT NA SUSP: 2.0000 | Freq: Every day | NASAL | Status: DC

## 2015-12-10 MED ORDER — OXYMETAZOLINE HCL 0.05 % NA SOLN: 1.0000 | Freq: Two times a day (BID) | NASAL | Status: DC

## 2015-12-10 MED ORDER — SODIUM CHLORIDE 0.9 % IV BOLUS (SEPSIS)
2000.0000 mL | Freq: Once | INTRAVENOUS | Status: AC
  Administered 2015-12-10: 2000 mL via INTRAVENOUS

## 2015-12-10 NOTE — ED Provider Notes (Signed)
CSN: [id number]     Arrival date & time 12/10/15  2026 History   First MD Initiated Contact with Patient 12/10/15 2038     Chief Complaint  Patient presents with  . Headache     (Consider location/radiation/quality/duration/timing/severity/associated sxs/prior Treatment) HPI Patient has a history of chronic migraines and chronic neck pain. She also has a history of allergies which have worsened over the last few days. She occasionally takes Claritin for this. States she woke this morning with some facial swelling, nasal congestion, pressure in bilateral ears and sore throat. She then developed headache while working. She describes his headache as pressure behind her eyes. She also endorses photophobia and nausea. She has neck pain but states this feels better today than it does most days. She has no fever or chills. No focal weakness or numbness. Evaluated in the urgent care facility and transferred to [facility] for further workup.  Past Medical History  Diagnosis Date  . Hypertension   . Anemia   . Neck strain 02/06/2012  . Positive TB test   . H/O hiatal hernia   . Chronic lower back pain   . Osteomyelitis ([place])     [name] 03/05/2015  . Hypothyroidism   . Hyperthyroidism     hx  . GERD (gastroesophageal reflux disease)   . Migraine headache     "a few times/yr" (03/05/2015)  . Osteomyelitis of toe of left foot ([place]) 03/05/2015  . History of amputation of lesser toe of left foot ([place]) 03/11/2015   Past Surgical History  Procedure Laterality Date  . Cesarean section  1979 1986 1990  . Appendectomy  2006  . Abdominal hysterectomy  2006  . Toe amputation Left 03/05/2015    5th toe  . Tubal ligation Bilateral 1990  . Amputation toe Left 03/05/2015    Procedure: AMPUTATION LEFT 5TH  TOE;  Surgeon: Wylene Simmer, MD;  Location: [place];  Service: Orthopedics;  Laterality: Left;   Family History  Problem Relation Age of Onset  . Heart disease Other   . Diabetes Other   . Heart disease Mother      CAB age 80  . Diabetes Mother   . Cancer Father 55    colon cancer  . Stroke Father   . Diabetes Sister   . Kidney disease Sister     renal failure  . Diabetes Brother   . Kidney disease Brother     renal failure   Social History  Substance Use Topics  . Smoking status: Current Every Day Smoker -- 0.00 packs/day for 36 years    Types: Cigarettes  . Smokeless tobacco: Never Used  . Alcohol Use: No   OB History    No data available     Review of Systems  Constitutional: Negative for fever and chills.  HENT: Positive for congestion, facial swelling, sinus pressure and sore throat.   Gastrointestinal: Positive for nausea. Negative for vomiting, abdominal pain and diarrhea.  Musculoskeletal: Positive for myalgias, back pain and neck pain.  Skin: Negative for rash and wound.  Neurological: Positive for headaches. Negative for dizziness, weakness, light-headedness and numbness.  All other systems reviewed and are negative.     Allergies  Clonidine derivatives; Aspirin; and Latex  Home Medications   Prior to Admission medications   Medication Sig Start Date End Date Taking? Authorizing Provider  amLODipine (NORVASC) 10 MG tablet Take 1 tablet (10 mg total) by mouth at bedtime. 10/27/15  Yes Kinnie Feil, MD  BIOTIN PO Take 1 tablet by mouth daily.    Yes Historical Provider, MD  Cyanocobalamin (B-12 PO) Take 1 tablet by mouth daily.    Yes Historical Provider, MD  cycloSPORINE (RESTASIS) 0.05 % ophthalmic emulsion Place 1 drop into both eyes 2 (two) times daily.    Yes Historical Provider, MD  Hypromellose (ARTIFICIAL TEARS OP) Place 1 drop into both eyes as needed (for dry eyes). Reported on 08/13/2015   Yes Historical Provider, MD  levothyroxine (SYNTHROID, LEVOTHROID) 112 MCG tablet Take 1 tablet (112 mcg total) by mouth daily before breakfast. 10/28/15  Yes Kinnie Feil, MD  lisinopril (PRINIVIL,ZESTRIL) 5 MG tablet Take 5 mg by mouth daily.   Yes Historical  Provider, MD  loratadine (CLARITIN) 10 MG tablet Take 10 mg by mouth daily. Reported on 11/18/2015   Yes Historical Provider, MD  metoprolol succinate (TOPROL-XL) 50 MG 24 hr tablet Take 1 tablet (50 mg total) by mouth daily. Take with or immediately following a meal. 10/27/15  Yes Kinnie Feil, MD  spironolactone (ALDACTONE) 25 MG tablet Take 1 tablet (25 mg total) by mouth daily. 10/28/15  Yes Kinnie Feil, MD  tiZANidine (ZANAFLEX) 4 MG tablet Take 1 tablet (4 mg total) by mouth every 6 (six) hours as needed for muscle spasms. 05/28/15  Yes Leone Brand, MD  traMADol-acetaminophen (ULTRACET) 37.5-325 MG tablet Take 1 tablet by mouth every 6 (six) hours as needed for moderate pain or severe pain. 08/13/15  Yes Kinnie Feil, MD  ibuprofen (ADVIL,MOTRIN) 600 MG tablet Take 1 tablet (600 mg total) by mouth 3 (three) times daily after meals. 12/10/15   Julianne Rice, MD  methocarbamol (ROBAXIN) 500 MG tablet Take 2 tablets (1,000 mg total) by mouth every 8 (eight) hours as needed for muscle spasms. 12/10/15   Julianne Rice, MD  metoCLOPramide (REGLAN) 10 MG tablet Take 1 tablet (10 mg total) by mouth every 6 (six) hours as needed for nausea (headache). 12/10/15   Julianne Rice, MD  mometasone (NASONEX) 50 MCG/ACT nasal spray Place 2 sprays into the nose daily. 12/10/15   Julianne Rice, MD  oxymetazoline (AFRIN NASAL SPRAY) 0.05 % nasal spray Place 1 spray into both nostrils 2 (two) times daily. 12/10/15   Julianne Rice, MD  varenicline (CHANTIX STARTING MONTH PAK) 0.5 MG X 11 & 1 MG X 42 tablet Take one 0.5 mg tablet by mouth once daily for 3 days, then increase to one 0.5 mg tablet twice daily for 4 days, then increase to one 1 mg tablet twice daily. Patient not taking: Reported on 11/18/2015 10/27/15   Kinnie Feil, MD   BP 169/94 mmHg  Pulse 58  Temp(Src) 97.6 F (36.4 C) (Oral)  Resp 16  Ht 5\' 5"  (1.651 m)  Wt 187 lb (84.823 kg)  BMI 31.12 kg/m2  SpO2 100% Physical Exam   Constitutional: She is oriented to person, place, and time. She appears well-developed and well-nourished. No distress.  Laying in the room with the lights off and eyes closed.  HENT:  Head: Normocephalic and atraumatic.  Mouth/Throat: Oropharynx is clear and moist.  Patient has tenderness to percussion over the right frontal and right maxillary sinuses. Bilateral nasal mucosal edema. Obvious facial swelling  Eyes: EOM are normal. Pupils are equal, round, and reactive to light.  Neck: Normal range of motion. Neck supple. No JVD present.  Diffuse muscular tenderness to the neck especially over the left paraspinal and left trapezius muscles. No definite meningismus.  Cardiovascular:  Normal rate and regular rhythm.  Exam reveals no gallop and no friction rub.   No murmur heard. Pulmonary/Chest: Effort normal and breath sounds normal. No respiratory distress. She has no wheezes. She has no rales. She exhibits no tenderness.  Abdominal: Soft. Bowel sounds are normal. She exhibits no distension and no mass. There is no tenderness. There is no rebound and no guarding.  Musculoskeletal: Normal range of motion. She exhibits no edema or tenderness.  Neurological: She is alert and oriented to person, place, and time.  Patient is alert and oriented x3 with clear, goal oriented speech. Patient has 5/5 motor in all extremities. Sensation is intact to light touch. Bilateral finger-to-nose is normal with no signs of dysmetria. Patient has a normal gait and walks without assistance.  Skin: Skin is warm and dry. No rash noted. No erythema.  Psychiatric: She has a normal mood and affect. Her behavior is normal.  Nursing note and vitals reviewed.   ED Course  Procedures (including critical care time) Labs Review Labs Reviewed  COMPREHENSIVE METABOLIC PANEL - Abnormal; Notable for the following:    Potassium 3.4 (*)    Total Bilirubin 0.2 (*)    Anion gap 4 (*)    All other components within normal limits   CBC WITH DIFFERENTIAL/PLATELET    Imaging Review No results found. I have personally reviewed and evaluated these images and lab results as part of my medical decision-making.   EKG Interpretation None      MDM   Final diagnoses:  Acute maxillary sinusitis, recurrence not specified  Migraine without status migrainosus, not intractable, unspecified migraine type    Patient with history of chronic headache and neck pain. Symptoms likely represent sinus disease with symptoms of typical migraine. I have little concern for meningitis/subarachnoid hemorrhage. She is afebrile and has a normal neurologic exam. We'll treat symptomatically and reassess.  Patient states her headache is significantly improved. He continues to have a normal neurologic exam. No definite meningismus. Return precautions given.  Julianne Rice, MD 12/10/15 2251

## 2015-12-10 NOTE — ED Notes (Signed)
Pt comes from urgent care via [facility] with complaint of headache x 3 hours and nausea x 1 hour, and neck stiffness for meningitis rule out per urgent care md. Pt states she has hx of migranes and chronic neck pain and the neck pain "feels better today than most days". Pt received 4mg  zofran iv and 200cc bolus at urgent care. #20 RFA

## 2015-12-10 NOTE — ED Provider Notes (Signed)
CSN: [id number]     Arrival date & time 12/10/15  1809 History   First MD Initiated Contact with Patient 12/10/15 1855     Chief Complaint  Patient presents with  . Headache   (Consider location/radiation/quality/duration/timing/severity/associated sxs/prior Treatment) HPI  Teresa [name] is a 55 year old woman here for evaluation of headache. Teresa [name] symptoms actually started last night with a sensation of swelling in Teresa [name] left eyelid. When Teresa [name] woke up this morning, Teresa [name] left eye looked swollen. Teresa [name] also reports feeling like the left side of Teresa [name] face is slightly swollen. Teresa [name] reports pain in the left ear and left throat. Over the course of the day Teresa [name] is also developed pain in the right throat. Teresa [name] does have some chronic neck issues, but states Teresa [name] neck is more stiff than normal today. Teresa [name] has had some mild nausea, but this acutely worsened all in the urgent care and Teresa [name] developed vomiting. Teresa [name] does report photophobia. Teresa [name] reports some blurred vision. Teresa [name] thinks this is in both eyes. Teresa [name] is supposed to wear glasses, but this may be a little worse than normal. Headache is located in the back of Teresa [name] head.  Teresa [name] does have a history of migraine headaches, but states it has been many years. In some ways this is similar, but Teresa [name] usually does not have the facial symptoms.  Past Medical History  Diagnosis Date  . Hypertension   . Anemia   . Neck strain 02/06/2012  . Positive TB test   . H/O hiatal hernia   . Chronic lower back pain   . Osteomyelitis ([place])     [name] 03/05/2015  . Hypothyroidism   . Hyperthyroidism     hx  . GERD (gastroesophageal reflux disease)   . Migraine headache     "a few times/yr" (03/05/2015)  . Osteomyelitis of toe of left foot ([place]) 03/05/2015  . History of amputation of lesser toe of left foot ([place]) 03/11/2015   Past Surgical History  Procedure Laterality Date  . Cesarean section  1979 1986 1990  . Appendectomy  2006  . Abdominal hysterectomy  2006  . Toe amputation Left 03/05/2015   5th toe  . Tubal ligation Bilateral 1990  . Amputation toe Left 03/05/2015    Procedure: AMPUTATION LEFT 5TH  TOE;  Surgeon: Wylene Simmer, MD;  Location: [facility];  Service: Orthopedics;  Laterality: Left;   Family History  Problem Relation Age of Onset  . Heart disease Other   . Diabetes Other   . Heart disease Mother     CAB age 45  . Diabetes Mother   . Cancer Father 40    colon cancer  . Stroke Father   . Diabetes Sister   . Kidney disease Sister     renal failure  . Diabetes Brother   . Kidney disease Brother     renal failure   Social History  Substance Use Topics  . Smoking status: Current Every Day Smoker -- 0.00 packs/day for 36 years    Types: Cigarettes  . Smokeless tobacco: Never Used  . Alcohol Use: No   OB History    No data available     Review of Systems As in history of present illness Allergies  Clonidine derivatives; Aspirin; and Latex  Home Medications   Prior to Admission medications   Medication Sig Start Date End Date Taking? Authorizing Provider  BIOTIN PO Take by mouth.   Yes Historical Provider, MD  Cyanocobalamin (B-12 PO) Take by mouth.   Yes Historical  Provider, MD  cycloSPORINE (RESTASIS) 0.05 % ophthalmic emulsion 1 drop 2 (two) times daily.   Yes Historical Provider, MD  levothyroxine (SYNTHROID, LEVOTHROID) 112 MCG tablet Take 1 tablet (112 mcg total) by mouth daily before breakfast. 10/28/15  Yes Kinnie Feil, MD  lisinopril (PRINIVIL,ZESTRIL) 5 MG tablet Take 5 mg by mouth daily.   Yes Historical Provider, MD  metoprolol succinate (TOPROL-XL) 50 MG 24 hr tablet Take 1 tablet (50 mg total) by mouth daily. Take with or immediately following a meal. 10/27/15  Yes Kinnie Feil, MD  spironolactone (ALDACTONE) 25 MG tablet Take 1 tablet (25 mg total) by mouth daily. 10/28/15  Yes Kinnie Feil, MD  amLODipine (NORVASC) 10 MG tablet Take 1 tablet (10 mg total) by mouth at bedtime. 10/27/15   Kinnie Feil, MD  Hypromellose (ARTIFICIAL  TEARS OP) Place 1 drop into both eyes as needed (for dry eyes). Reported on 08/13/2015    Historical Provider, MD  loratadine (CLARITIN) 10 MG tablet Take 10 mg by mouth daily. Reported on 11/18/2015    Historical Provider, MD  tiZANidine (ZANAFLEX) 4 MG tablet Take 1 tablet (4 mg total) by mouth every 6 (six) hours as needed for muscle spasms. 05/28/15   Leone Brand, MD  traMADol-acetaminophen (ULTRACET) 37.5-325 MG tablet Take 1 tablet by mouth every 6 (six) hours as needed for moderate pain or severe pain. 08/13/15   Kinnie Feil, MD  varenicline (CHANTIX STARTING MONTH PAK) 0.5 MG X 11 & 1 MG X 42 tablet Take one 0.5 mg tablet by mouth once daily for 3 days, then increase to one 0.5 mg tablet twice daily for 4 days, then increase to one 1 mg tablet twice daily. Patient not taking: Reported on 11/18/2015 10/27/15   Kinnie Feil, MD   Meds Ordered and Administered this Visit   Medications  0.9 %  sodium chloride infusion (not administered)  ondansetron (ZOFRAN) injection 4 mg (not administered)    BP 170/72 mmHg  Pulse 60  Temp(Src) 98.7 F (37.1 C) (Oral)  Resp 16  SpO2 100% No data found.   Physical Exam  Constitutional: Teresa [name] is oriented to person, place, and time. Teresa [name] appears well-developed and well-nourished. Teresa [name] appears distressed (retching in the room. Room is darkened.).  HENT:  Mouth/Throat: Oropharynx is clear and moist. No oropharyngeal exudate.  Bilateral TMs obscured by cerumen. Left eyelids do appear slightly swollen. They are tender. The left side of Teresa [name] face is slightly swollen compared to the right. No facial droop.  Eyes: EOM are normal. Pupils are equal, round, and reactive to light.  Positive photophobia  Neck:  Teresa [name] does have pain with extension and flexion. Teresa [name] is unable to fully flex Teresa [name] neck.  Cardiovascular: Normal rate, regular rhythm and normal heart sounds.   No murmur heard. Pulmonary/Chest: Effort normal and breath sounds normal. No respiratory  distress. Teresa [name] has no wheezes. Teresa [name] has no rales.  Neurological: Teresa [name] is alert and oriented to person, place, and time.    ED Course  Procedures (including critical care time)  Labs Review Labs Reviewed - No data to display  Imaging Review No results found.   MDM   1. Acute nonintractable headache, unspecified headache type   2. Non-intractable vomiting with nausea, vomiting of unspecified type   3. Neck stiffness   4. Photophobia    Differential includes migraine, meningitis, retropharyngeal abscess.  I'm also concerned for potential eye problems given the swelling of Teresa [name] eye. We'll go  ahead and start an IV here and give Zofran to help with nausea. Transfer to [facility] via [facility] for additional evaluation.    Melony Overly, MD 12/10/15 [phone]

## 2015-12-10 NOTE — ED Notes (Signed)
Pt reports last night her left eye began to bother her. Today, PT feels as though her left eye was swollen. Her right eye starting running. PT reports pressure behind her eyes all day. PT reports by 4pm she felt as though her whole face was "getting big." PT's face looks slightly swollen on the right side. PT reports her ears feel clogged and her throat feels a little tight. PT has not taken any new meds in over 1 month. PT reports a severe headache that started at 4pm. PT takes lisinopril, spironolactone, metoprolol, and synthriod.

## 2015-12-10 NOTE — ED Notes (Signed)
[facility] notified to transport

## 2015-12-10 NOTE — ED Notes (Signed)
Phlebotomy at bedside at this time.

## 2015-12-10 NOTE — ED Notes (Signed)
No [facility] truck available 

## 2015-12-10 NOTE — Discharge Instructions (Signed)
Migraine Headache °A migraine headache is an intense, throbbing pain on one or both sides of your head. A migraine can last for 30 minutes to several hours. °CAUSES  °The exact cause of a migraine headache is not always known. However, a migraine may be caused when nerves in the brain become irritated and release chemicals that cause inflammation. This causes pain. °Certain things may also trigger migraines, such as: °· Alcohol. °· Smoking. °· Stress. °· Menstruation. °· Aged cheeses. °· Foods or drinks that contain nitrates, glutamate, aspartame, or tyramine. °· Lack of sleep. °· Chocolate. °· Caffeine. °· Hunger. °· Physical exertion. °· Fatigue. °· Medicines used to treat chest pain (nitroglycerine), birth control pills, estrogen, and some blood pressure medicines. °SIGNS AND SYMPTOMS °· Pain on one or both sides of your head. °· Pulsating or throbbing pain. °· Severe pain that prevents daily activities. °· Pain that is aggravated by any physical activity. °· Nausea, vomiting, or both. °· Dizziness. °· Pain with exposure to bright lights, loud noises, or activity. °· General sensitivity to bright lights, loud noises, or smells. °Before you get a migraine, you may get warning signs that a migraine is coming (aura). An aura may include: °· Seeing flashing lights. °· Seeing bright spots, halos, or zigzag lines. °· Having tunnel vision or blurred vision. °· Having feelings of numbness or tingling. °· Having trouble talking. °· Having muscle weakness. °DIAGNOSIS  °A migraine headache is often diagnosed based on: °· Symptoms. °· Physical exam. °· A CT scan or MRI of your head. These imaging tests cannot diagnose migraines, but they can help rule out other causes of headaches. °TREATMENT °Medicines may be given for pain and nausea. Medicines can also be given to help prevent recurrent migraines.  °HOME CARE INSTRUCTIONS °· Only take over-the-counter or prescription medicines for pain or discomfort as directed by your  health care provider. The use of long-term narcotics is not recommended. °· Lie down in a dark, quiet room when you have a migraine. °· Keep a journal to find out what may trigger your migraine headaches. For example, write down: °· What you eat and drink. °· How much sleep you get. °· Any change to your diet or medicines. °· Limit alcohol consumption. °· Quit smoking if you smoke. °· Get 7-9 hours of sleep, or as recommended by your health care provider. °· Limit stress. °· Keep lights dim if bright lights bother you and make your migraines worse. °SEEK IMMEDIATE MEDICAL CARE IF:  °· Your migraine becomes severe. °· You have a fever. °· You have a stiff neck. °· You have vision loss. °· You have muscular weakness or loss of muscle control. °· You start losing your balance or have trouble walking. °· You feel faint or pass out. °· You have severe symptoms that are different from your first symptoms. °MAKE SURE YOU:  °· Understand these instructions. °· Will watch your condition. °· Will get help right away if you are not doing well or get worse. °  °This information is not intended to replace advice given to you by your health care provider. Make sure you discuss any questions you have with your health care provider. °  °Document Released: 06/13/2005 Document Revised: 07/04/2014 Document Reviewed: 02/18/2013 °Elsevier Interactive Patient Education ©2016 Elsevier Inc. ° °Sinusitis, Adult °Sinusitis is redness, soreness, and inflammation of the paranasal sinuses. Paranasal sinuses are air pockets within the bones of your face. They are located beneath your eyes, in the middle of your   forehead, and above your eyes. In healthy paranasal sinuses, mucus is able to drain out, and air is able to circulate through them by way of your nose. However, when your paranasal sinuses are inflamed, mucus and air can become trapped. This can allow bacteria and other germs to grow and cause infection. Sinusitis can develop quickly and  last only a short time (acute) or continue over a long period (chronic). Sinusitis that lasts for more than 12 weeks is considered chronic. CAUSES Causes of sinusitis include:  Allergies.  Structural abnormalities, such as displacement of the cartilage that separates your nostrils (deviated septum), which can decrease the air flow through your nose and sinuses and affect sinus drainage.  Functional abnormalities, such as when the small hairs (cilia) that line your sinuses and help remove mucus do not work properly or are not present. SIGNS AND SYMPTOMS Symptoms of acute and chronic sinusitis are the same. The primary symptoms are pain and pressure around the affected sinuses. Other symptoms include:  Upper toothache.  Earache.  Headache.  Bad breath.  Decreased sense of smell and taste.  A cough, which worsens when you are lying flat.  Fatigue.  Fever.  Thick drainage from your nose, which often is green and may contain pus (purulent).  Swelling and warmth over the affected sinuses. DIAGNOSIS Your health care provider will perform a physical exam. During your exam, your health care provider may perform any of the following to help determine if you have acute sinusitis or chronic sinusitis:  Look in your nose for signs of abnormal growths in your nostrils (nasal polyps).  Tap over the affected sinus to check for signs of infection.  View the inside of your sinuses using an imaging device that has a light attached (endoscope). If your health care provider suspects that you have chronic sinusitis, one or more of the following tests may be recommended:  Allergy tests.  Nasal culture. A sample of mucus is taken from your nose, sent to a lab, and screened for bacteria.  Nasal cytology. A sample of mucus is taken from your nose and examined by your health care provider to determine if your sinusitis is related to an allergy. TREATMENT Most cases of acute sinusitis are related  to a viral infection and will resolve on their own within 10 days. Sometimes, medicines are prescribed to help relieve symptoms of both acute and chronic sinusitis. These may include pain medicines, decongestants, nasal steroid sprays, or saline sprays. However, for sinusitis related to a bacterial infection, your health care provider will prescribe antibiotic medicines. These are medicines that will help kill the bacteria causing the infection. Rarely, sinusitis is caused by a fungal infection. In these cases, your health care provider will prescribe antifungal medicine. For some cases of chronic sinusitis, surgery is needed. Generally, these are cases in which sinusitis recurs more than 3 times per year, despite other treatments. HOME CARE INSTRUCTIONS  Drink plenty of water. Water helps thin the mucus so your sinuses can drain more easily.  Use a humidifier.  Inhale steam 3-4 times a day (for example, sit in the bathroom with the shower running).  Apply a warm, moist washcloth to your face 3-4 times a day, or as directed by your health care provider.  Use saline nasal sprays to help moisten and clean your sinuses.  Take medicines only as directed by your health care provider.  If you were prescribed either an antibiotic or antifungal medicine, finish it all even if you  start to feel better. SEEK IMMEDIATE MEDICAL CARE IF:  You have increasing pain or severe headaches.  You have nausea, vomiting, or drowsiness.  You have swelling around your face.  You have vision problems.  You have a stiff neck.  You have difficulty breathing.   This information is not intended to replace advice given to you by your health care provider. Make sure you discuss any questions you have with your health care provider.   Document Released: 06/13/2005 Document Revised: 07/04/2014 Document Reviewed: 06/28/2011 Elsevier Interactive Patient Education [employer].

## 2015-12-15 ENCOUNTER — Ambulatory Visit: Payer: BLUE CROSS/BLUE SHIELD | Admitting: Family Medicine

## 2016-01-01 ENCOUNTER — Encounter: Payer: Self-pay | Admitting: Family Medicine

## 2016-01-05 ENCOUNTER — Telehealth: Payer: Self-pay | Admitting: *Deleted

## 2016-01-05 ENCOUNTER — Ambulatory Visit (INDEPENDENT_AMBULATORY_CARE_PROVIDER_SITE_OTHER): Payer: BLUE CROSS/BLUE SHIELD | Admitting: Family Medicine

## 2016-01-05 ENCOUNTER — Encounter: Payer: Self-pay | Admitting: Family Medicine

## 2016-01-05 ENCOUNTER — Ambulatory Visit (HOSPITAL_COMMUNITY)
Admission: RE | Admit: 2016-01-05 | Discharge: 2016-01-05 | Disposition: A | Discharge status: Home / Self Care | Payer: BLUE CROSS/BLUE SHIELD | Source: Ambulatory Visit | Attending: Family Medicine | Admitting: Family Medicine

## 2016-01-05 ENCOUNTER — Other Ambulatory Visit: Payer: Self-pay

## 2016-01-05 VITALS — BP 146/76 | HR 71 | Temp 98.3°F | Wt 188.0 lb

## 2016-01-05 DIAGNOSIS — M67912 Unspecified disorder of synovium and tendon, left shoulder: Secondary | ICD-10-CM | POA: Diagnosis not present

## 2016-01-05 DIAGNOSIS — E039 Hypothyroidism, unspecified: Secondary | ICD-10-CM | POA: Diagnosis not present

## 2016-01-05 DIAGNOSIS — R9431 Abnormal electrocardiogram [ECG] [EKG]: Secondary | ICD-10-CM | POA: Diagnosis not present

## 2016-01-05 DIAGNOSIS — R002 Palpitations: Secondary | ICD-10-CM

## 2016-01-05 DIAGNOSIS — R7303 Prediabetes: Secondary | ICD-10-CM

## 2016-01-05 DIAGNOSIS — I10 Essential (primary) hypertension: Secondary | ICD-10-CM

## 2016-01-05 HISTORY — DX: Palpitations: R00.2

## 2016-01-05 MED ORDER — SPIRONOLACTONE 25 MG PO TABS: 25.0000 mg | ORAL_TABLET | Freq: Every day | ORAL | Status: DC

## 2016-01-05 NOTE — Assessment & Plan Note (Signed)
BP looks good. I refilled her Aldactone. Bmet checked for potassium today. Continue home BP check. F/U in 3 months

## 2016-01-05 NOTE — Assessment & Plan Note (Signed)
TSh checked today. I will contact her with result. Continue current dose of Synthroid in the mean time.

## 2016-01-05 NOTE — Telephone Encounter (Signed)
LM for patient ok per DPR with appt info. Jazmin Hartsell,CMA

## 2016-01-05 NOTE — Telephone Encounter (Signed)
[facility] just called stating that MD put Echo order in STAT and they will not have anything until the end of the month. If MD wants this STAT it will have to be scheduled at the hospital. FYI to blue team and PCP.

## 2016-01-05 NOTE — Assessment & Plan Note (Signed)
Currently asymptomatic, no chest pain. Wonder if this is related to her thyroid dysfunction, although would be unlikely in hypothyroidism. She does not have chest pain. EKG done and reviewed by me: NSR at 66 bpm, LV, Twave inversion on inferior lateral lead. Looks similar to previous EKG. As discussed with patient, she will benefit from stress test. Referral to cardiologist placed. Will obtain ECHO. Instruction given to go to the ED if having chest pain or palpitation. She verbalized understanding.

## 2016-01-05 NOTE — Progress Notes (Signed)
Subjective:     Patient ID: Teresa [name], female   DOB: Sep 24, 1960, 55 y.o.   MRN: [id number]  HPI WUJ:[id number]:here for follow up. She is compliant with all her meds. She need refill of her aldactone. Denies any concern. Hypothyroidism: She is compliant with her synthroid. Here for follow up. Prediabetes:Patient is compliant with diet and exercise, she lost 4 lbs just by exercising. She has been drinking lime as well which she felt helped in losing weight. Denies any concern. RTC tendinitis:Still having left shoulder pain. Takes pain meds as instructed as needed. Here for follow up. Palpitation:Patient stated she was recently seen at the ED fr migraine headache and the EMS who brought her to the ED told her she has some abnormality on her EKG. She denies chest pain today but occasionally she will have flutters and palpitations, also feeling of burning in her chest but no chest pain or SOB.  Current Outpatient Prescriptions on File Prior to Visit  Medication Sig Dispense Refill  . amLODipine (NORVASC) 10 MG tablet Take 1 tablet (10 mg total) by mouth at bedtime. 90 tablet 1  . BIOTIN PO Take 1 tablet by mouth daily.     . Cyanocobalamin (B-12 PO) Take 1 tablet by mouth daily.     . cycloSPORINE (RESTASIS) 0.05 % ophthalmic emulsion Place 1 drop into both eyes 2 (two) times daily.     . Hypromellose (ARTIFICIAL TEARS OP) Place 1 drop into both eyes as needed (for dry eyes). Reported on 08/13/2015    . ibuprofen (ADVIL,MOTRIN) 600 MG tablet Take 1 tablet (600 mg total) by mouth 3 (three) times daily after meals. 30 tablet 0  . levothyroxine (SYNTHROID, LEVOTHROID) 112 MCG tablet Take 1 tablet (112 mcg total) by mouth daily before breakfast. 90 tablet 1  . lisinopril (PRINIVIL,ZESTRIL) 5 MG tablet Take 5 mg by mouth daily.    Marland Kitchen. loratadine (CLARITIN) 10 MG tablet Take 10 mg by mouth daily. Reported on 11/18/2015    . methocarbamol (ROBAXIN) 500 MG tablet Take 2 tablets (1,000 mg total) by mouth every 8 (eight)  hours as needed for muscle spasms. 30 tablet 0  . metoCLOPramide (REGLAN) 10 MG tablet Take 1 tablet (10 mg total) by mouth every 6 (six) hours as needed for nausea (headache). 30 tablet 0  . metoprolol succinate (TOPROL-XL) 50 MG 24 hr tablet Take 1 tablet (50 mg total) by mouth daily. Take with or immediately following a meal. 90 tablet 1  . mometasone (NASONEX) 50 MCG/ACT nasal spray Place 2 sprays into the nose daily. 17 g 12  . oxymetazoline (AFRIN NASAL SPRAY) 0.05 % nasal spray Place 1 spray into both nostrils 2 (two) times daily. 30 mL 0  . spironolactone (ALDACTONE) 25 MG tablet Take 1 tablet (25 mg total) by mouth daily. 90 tablet 1  . tiZANidine (ZANAFLEX) 4 MG tablet Take 1 tablet (4 mg total) by mouth every 6 (six) hours as needed for muscle spasms. 30 tablet 0  . traMADol-acetaminophen (ULTRACET) 37.5-325 MG tablet Take 1 tablet by mouth every 6 (six) hours as needed for moderate pain or severe pain. 90 tablet 1  . varenicline (CHANTIX STARTING MONTH PAK) 0.5 MG X 11 & 1 MG X 42 tablet Take one 0.5 mg tablet by mouth once daily for 3 days, then increase to one 0.5 mg tablet twice daily for 4 days, then increase to one 1 mg tablet twice daily. (Patient not taking: Reported on 11/18/2015) 53 tablet 0   No  current facility-administered medications on file prior to visit.   Past Medical History  Diagnosis Date  . Hypertension   . Anemia   . Neck strain 02/06/2012  . Positive TB test   . H/O hiatal hernia   . Chronic lower back pain   . Osteomyelitis ([place])     [name] 03/05/2015  . Hypothyroidism   . Hyperthyroidism     hx  . GERD (gastroesophageal reflux disease)   . Migraine headache     "a few times/yr" (03/05/2015)  . Osteomyelitis of toe of left foot ([place]) 03/05/2015  . History of amputation of lesser toe of left foot ([place]) 03/11/2015   Filed Vitals:   01/05/16 0838  BP: 146/76  Pulse: 71  Temp: 98.3 F (36.8 C)  TempSrc: Oral  Weight: 188 lb (85.276 kg)  SpO2: 100%      Review of Systems  Respiratory: Negative.   Cardiovascular: Positive for palpitations. Negative for chest pain and leg swelling.  Gastrointestinal: Negative.   Musculoskeletal: Positive for arthralgias. Negative for joint swelling.  All other systems reviewed and are negative.      Objective:   Physical Exam  Constitutional: She is oriented to person, place, and time. She appears well-developed. No distress.  Neck: No thyromegaly present.  Cardiovascular: Normal rate, regular rhythm, normal heart sounds and intact distal pulses.   No murmur heard. Pulmonary/Chest: Effort normal and breath sounds normal. No respiratory distress. She has no wheezes.  Abdominal: Soft. Bowel sounds are normal. She exhibits no distension and no mass. There is no tenderness.  Musculoskeletal:       Right shoulder: Normal.       Left shoulder: She exhibits decreased range of motion and tenderness.  Neurological: She is alert and oriented to person, place, and time. No cranial nerve deficit.  Nursing note and vitals reviewed.      Assessment:     HTN: Hypothyroidism: Prediabetes: Rotator cuff tendinitis Plapitation    Plan:     Check problem list.

## 2016-01-05 NOTE — Assessment & Plan Note (Signed)
I commended her on exercise for weight loss. Continue diet and exercise control for now.

## 2016-01-05 NOTE — Patient Instructions (Signed)
It was nice seeing you today. I have placed referral to cardiologist for your heart symptoms. Also sport medicine for shoulder pain.Please go to the hospital if having chest pain or difficulty breathing. Continue synthroid for your hypothyroidism and I will call you with all your test results today.

## 2016-01-05 NOTE — Telephone Encounter (Signed)
May get her earliest available schedule. Please help schedule.

## 2016-01-05 NOTE — Assessment & Plan Note (Signed)
Still not very well controlled on Ultracet and Ibuprofen. I recommended sport medicine referral for intraarticular joint injection. Referral placed.

## 2016-01-05 NOTE — Telephone Encounter (Signed)
LM for patient to call back.  I need to know what dates and times don't work for patient before I call and schedule this echo.  Chandlar Staebell,CMA

## 2016-01-05 NOTE — Telephone Encounter (Signed)
-----   Message from Kinnie Feil, MD sent at 01/05/2016  2:42 PM EDT ----- Please help schedule ECHO. Call patient with appointment. Thanks.

## 2016-01-06 ENCOUNTER — Telehealth: Payer: Self-pay | Admitting: *Deleted

## 2016-01-06 ENCOUNTER — Telehealth: Payer: Self-pay | Admitting: Family Medicine

## 2016-01-06 LAB — BASIC METABOLIC PANEL
BUN: 13 mg/dL (ref 7–25)
CO2: 21 mmol/L (ref 20–31)
Calcium: 9.8 mg/dL (ref 8.6–10.4)
Chloride: 107 mmol/L (ref 98–110)
Creat: 0.88 mg/dL (ref 0.50–1.05)
Glucose, Bld: 89 mg/dL (ref 65–99)
Potassium: 4 mmol/L (ref 3.5–5.3)
Sodium: 141 mmol/L (ref 135–146)

## 2016-01-06 LAB — TSH: TSH: 1.89 mIU/L

## 2016-01-06 NOTE — Telephone Encounter (Signed)
Spoke with Kenney Houseman with [facility] and order# [id number] valid from 7/12-8/10/17.  Tillie Rung is made aware.  Scotti Motter,CMA

## 2016-01-06 NOTE — Telephone Encounter (Signed)
Patient scheduled for echo tomorrow, needs precert or it will be cancelled. Please call Tillie Rung with authorization number [id number]

## 2016-01-06 NOTE — Telephone Encounter (Signed)
Test result discussed with patient. Her TSH rapidly dropped from 10 two months ago to 1.8. She stated the lime and vinegar she drinks also treats hypothyroidism. Plan to continue current dose of synthroid and recheck TSH in the next 4-8 wks, if it drops further, I will cut back on her synthroid. She agreed with plan;

## 2016-01-07 ENCOUNTER — Telehealth: Payer: Self-pay | Admitting: Family Medicine

## 2016-01-07 ENCOUNTER — Ambulatory Visit (HOSPITAL_COMMUNITY)
Admission: RE | Admit: 2016-01-07 | Discharge: 2016-01-07 | Disposition: A | Discharge status: Home / Self Care | Payer: BLUE CROSS/BLUE SHIELD | Source: Ambulatory Visit | Attending: Family Medicine | Admitting: Family Medicine

## 2016-01-07 DIAGNOSIS — I351 Nonrheumatic aortic (valve) insufficiency: Secondary | ICD-10-CM | POA: Insufficient documentation

## 2016-01-07 DIAGNOSIS — R7303 Prediabetes: Secondary | ICD-10-CM | POA: Diagnosis not present

## 2016-01-07 DIAGNOSIS — Z720 Tobacco use: Secondary | ICD-10-CM | POA: Diagnosis not present

## 2016-01-07 DIAGNOSIS — K219 Gastro-esophageal reflux disease without esophagitis: Secondary | ICD-10-CM | POA: Insufficient documentation

## 2016-01-07 DIAGNOSIS — E039 Hypothyroidism, unspecified: Secondary | ICD-10-CM | POA: Insufficient documentation

## 2016-01-07 DIAGNOSIS — R002 Palpitations: Secondary | ICD-10-CM

## 2016-01-07 DIAGNOSIS — I119 Hypertensive heart disease without heart failure: Secondary | ICD-10-CM | POA: Diagnosis not present

## 2016-01-07 DIAGNOSIS — I059 Rheumatic mitral valve disease, unspecified: Secondary | ICD-10-CM | POA: Insufficient documentation

## 2016-01-07 NOTE — Telephone Encounter (Signed)
I later realized that her cardiology appointment is actually scheduled for Aug 17th and not next week. Hence I advised her to come in to see me sooner to discuss management of her HFpEF. She agreed with plan.  Note she is already of appropriate medications and I doubt she will need any changes now. I will see her at follow up.

## 2016-01-07 NOTE — Progress Notes (Signed)
*  PRELIMINARY RESULTS* Echocardiogram 2D Echocardiogram has been performed.  Leavy Cella 01/07/2016, 9:54 AM

## 2016-01-07 NOTE — Telephone Encounter (Signed)
ECHO result discussed with patient showing HFpEF. She is currently asymptomatic. She has an upcoming Cards appointment next Monday, as discussed with her, I will rather she sees the cardiologist first before making medication adjustment. Return precaution discussed. She agreed with plan and verbalized understanding.

## 2016-01-12 ENCOUNTER — Encounter: Payer: Self-pay | Admitting: Family Medicine

## 2016-01-19 ENCOUNTER — Ambulatory Visit: Payer: BLUE CROSS/BLUE SHIELD | Admitting: Family Medicine

## 2016-01-20 ENCOUNTER — Encounter: Payer: Self-pay | Admitting: Student

## 2016-01-20 ENCOUNTER — Ambulatory Visit (INDEPENDENT_AMBULATORY_CARE_PROVIDER_SITE_OTHER): Payer: BLUE CROSS/BLUE SHIELD | Admitting: Student

## 2016-01-20 VITALS — BP 171/83 | HR 70 | Ht 65.0 in | Wt 187.0 lb

## 2016-01-20 DIAGNOSIS — M67912 Unspecified disorder of synovium and tendon, left shoulder: Secondary | ICD-10-CM

## 2016-01-20 DIAGNOSIS — M5410 Radiculopathy, site unspecified: Secondary | ICD-10-CM | POA: Diagnosis not present

## 2016-01-20 MED ORDER — METHYLPREDNISOLONE ACETATE 40 MG/ML IJ SUSP
40.0000 mg | Freq: Once | INTRAMUSCULAR | Status: AC
  Administered 2016-01-20: 40 mg via INTRA_ARTICULAR

## 2016-01-20 NOTE — Assessment & Plan Note (Addendum)
Left shoulder was injected with 3:1 Xylocaine to 40 mg/mL of Depo-Medrol. Patient tolerated procedure well and she'll follow up in 3 weeks to recheck symptoms. If her symptoms are not resolved or at least improved may consider getting an MRI of her cervical spine to check for cervical spine disease.

## 2016-01-20 NOTE — Progress Notes (Signed)
  Teresa [name] - 55 y.o. female MRN [id number]  Date of birth: 28-Feb-1961  SUBJECTIVE:  Including CC & ROS.  Chief complaint left shoulder pain. Patient comes in with a one-year history of left shoulder pain. She states that starts in her posterior shoulder and at times it'll radiate all the way down her arm. She has had workup in the past of neck and shoulder x-rays and a shoulder MRI. Her neck x-rays did not show any fracture or arthritis. Her shoulder x-ray did not show any fracture or arthritis. Her shoulder MRI showed rotator cuff tendinopathy without tear and before meals joint inflammation. She is not sure if she had a cervical MRI but believe she did. 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 She is able to do overhead activities but they do hurt. She at times will get the radiation down her arm with certain arm movements. She denies any weakness to her upper extremities. She does try to exercise on a regular basis. Denies any numbness to her upper extremities. She was sent here from her primary care's office for an injection. She has tried Ultracet and ibuprofen without relief. She is left-hand dominant.    ROS: No unexpected weight loss, fever, chills, swelling, instability, muscle pain, numbness/tingling, redness, otherwise see HPI    HISTORY: Past Medical, Surgical, Social, and Family History Reviewed & Updated per EMR.   Pertinent Historical Findings include: PMSHx -  hypertension, hypothyroidism, obesity PSHx -  recent toe amputation FHx -  noncontributory Medications -Norvasc, ibuprofen, Synthroid, lisinopril, Claritin, Robaxin, metoprolol, Ultracet, spironolactone  DATA REVIEWED: Cervical spine x-rays reviewed, left shoulder x-rays reviewed, left shoulder MRI reviewed  PHYSICAL EXAM:  VS: BP:(!) 171/83  HR:70bpm  TEMP: ( )  RESP:   HT:5\' 5"  (165.1 cm)   WT:187 lb (84.8 kg)  BMI:31.2 PHYSICAL EXAM: Gen: NAD, alert, cooperative with exam,  well-appearing HEENT: clear conjunctiva,  CV:  no edema, capillary refill brisk, normal rate Resp: non-labored Skin: no rashes, normal turgor  Neuro: no gross deficits.  Psych:  alert and oriented  Shoulder: Inspection reveals no abnormalities, atrophy or asymmetry. Palpation reveals tenderness over bicipital groove and acromioclavicular joint on the left. ROM is full in all planes, but has some minimal tenderness to palpation Rotator cuff strength normal throughout. Positive signs of impingement with positive Neer and Hawkin's tests, empty can sign. Speeds negative by Leonette Monarch test was positive. No labral pathology noted with negative Obrien's, negative clunk and good stability. Normal scapular function observed. No drop arm sign. No apprehension sign     ASSESSMENT & PLAN:   Tendinopathy of left rotator cuff Left shoulder was injected with 3:1 Xylocaine to 40 mg/mL of Depo-Medrol. Patient tolerated procedure well and she'll follow up in 3 weeks to recheck symptoms. If her symptoms are not resolved or at least improved may consider getting an MRI of her cervical spine to check for cervical spine disease. INJECTION:  Patient was given informed consent, signed copy in the chart. Appropriate time out was taken. Area prepped and draped in usual sterile fashion. One cc depomedrol 40 mg/ml plus 3 cc of 1%all ocaine without epinephrine was injected into left shoulder joint using a subacromial approach. The patient tolerated the procedure well. There were no complications. Post procedure instructions were given.

## 2016-02-10 ENCOUNTER — Encounter: Payer: Self-pay | Admitting: Student

## 2016-02-10 ENCOUNTER — Ambulatory Visit (INDEPENDENT_AMBULATORY_CARE_PROVIDER_SITE_OTHER): Payer: BLUE CROSS/BLUE SHIELD | Admitting: Student

## 2016-02-10 ENCOUNTER — Other Ambulatory Visit: Payer: Self-pay | Admitting: Student

## 2016-02-10 VITALS — BP 138/82 | Ht 65.0 in | Wt 187.0 lb

## 2016-02-10 DIAGNOSIS — M5010 Cervical disc disorder with radiculopathy, unspecified cervical region: Secondary | ICD-10-CM | POA: Diagnosis not present

## 2016-02-10 DIAGNOSIS — M542 Cervicalgia: Secondary | ICD-10-CM | POA: Diagnosis not present

## 2016-02-10 DIAGNOSIS — M67912 Unspecified disorder of synovium and tendon, left shoulder: Secondary | ICD-10-CM | POA: Diagnosis not present

## 2016-02-10 HISTORY — DX: Cervical disc disorder with radiculopathy, unspecified cervical region: M50.10

## 2016-02-10 NOTE — Assessment & Plan Note (Signed)
Will get MRI of the cervical spine. We will call with results. I reviewed potential plans including epidural steroid injection and surgical options. Patient expressed understanding. Subacromial injection did not work for patient.

## 2016-02-10 NOTE — Progress Notes (Signed)
  Teresa [name] - 55 y.o. female MRN [id number]  Date of birth: March 08, 1961  SUBJECTIVE:  Including CC & ROS.  CC: Left shoulder pain Patient presents in follow-up for left shoulder pain. She was seen about a month ago and her shoulder was injected at that time. She reports no improvement of her symptoms with shoulder injection. She has had shoulder x-rays and cervical spine x-rays which do not show any acute disease process. Left shoulder MRI shows left rotator cuff tendinopathy without tear. She is taking anti-inflammatories and Ultracet without relief.  She does complain of pain that radiates from her neck down her left arm. She does report some weakness at times and has numbness and tingling of her left arm. Denies any issues with her right arm.    ROS: No unexpected weight loss, fever, chills, swelling, instability, muscle pain, redness, otherwise see HPI   PMHx - Updated and reviewed.  Contributory factors include: Negative PSHx - Updated and reviewed.  Contributory factors include:  Negative FHx - Updated and reviewed.  Contributory factors include:  Negative Social Hx - Updated and reviewed. 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 Cervical spine x-rays  PHYSICAL EXAM:  VS: BP:138/82  HR: bpm  TEMP: ( )  RESP:   HT:5\' 5"  (165.1 cm)   WT:187 lb (84.8 kg)  BMI:31.2 PHYSICAL EXAM: Gen: NAD, alert, cooperative with exam, well-appearing HEENT: clear conjunctiva,  CV:  no edema, capillary refill brisk, normal rate Resp: non-labored Skin: no rashes, normal turgor  Neuro: no gross deficits.  Psych:  alert and oriented  Neck: Inspection unremarkable. No palpable stepoffs. Negative Spurling's maneuver. Full neck range of motion Grip strength normal in bilateral hands, subjective decrease in sensation throughout her left arm Strength good C4 to T1 distribution Negative Hoffman sign bilaterally Reflexes  2+ throughout on right. Left brachial radialis 0 out of 4   Shoulder: Inspection reveals no abnormalities, atrophy or asymmetry. Palpation is normal with no tenderness over AC joint or bicipital groove. ROM is full in all planes. Rotator cuff strength normal throughout. Positive signs of impingement with positive Neer and Hawkin's tests, empty can sign. No painful arc and no drop arm sign.     ASSESSMENT & PLAN:   Cervical disc disorder with radiculopathy of cervical region Will get MRI of the cervical spine. We will call with results. I reviewed potential plans including epidural steroid injection and surgical options. Patient expressed understanding. Subacromial injection did not work for patient.

## 2016-02-11 ENCOUNTER — Encounter: Payer: Self-pay | Admitting: Cardiovascular Disease

## 2016-02-11 ENCOUNTER — Ambulatory Visit (INDEPENDENT_AMBULATORY_CARE_PROVIDER_SITE_OTHER): Payer: BLUE CROSS/BLUE SHIELD | Admitting: Cardiovascular Disease

## 2016-02-11 VITALS — BP 170/90 | HR 59 | Ht 65.0 in | Wt 188.4 lb

## 2016-02-11 DIAGNOSIS — R002 Palpitations: Secondary | ICD-10-CM

## 2016-02-11 DIAGNOSIS — R079 Chest pain, unspecified: Secondary | ICD-10-CM | POA: Diagnosis not present

## 2016-02-11 DIAGNOSIS — R0681 Apnea, not elsewhere classified: Secondary | ICD-10-CM

## 2016-02-11 DIAGNOSIS — I5032 Chronic diastolic (congestive) heart failure: Secondary | ICD-10-CM

## 2016-02-11 DIAGNOSIS — I10 Essential (primary) hypertension: Secondary | ICD-10-CM

## 2016-02-11 DIAGNOSIS — R0683 Snoring: Secondary | ICD-10-CM | POA: Diagnosis not present

## 2016-02-11 MED ORDER — CARVEDILOL 12.5 MG PO TABS: 12.5000 mg | ORAL_TABLET | Freq: Two times a day (BID) | ORAL | 5 refills | Status: DC

## 2016-02-11 NOTE — Patient Instructions (Addendum)
Heart Failure Heart failure is a condition in which the heart has trouble pumping blood. This means your heart does not pump blood efficiently for your body to work well. In some cases of heart failure, fluid may back up into your lungs or you may have swelling (edema) in your lower legs. Heart failure is usually a long-term (chronic) condition. It is important for you to take good care of yourself and follow your health care provider's treatment plan. CAUSES  Some health conditions can cause heart failure. Those health conditions include:  High blood pressure (hypertension). Hypertension causes the heart muscle to work harder than normal. When pressure in the blood vessels is high, the heart needs to pump (contract) with more force in order to circulate blood throughout the body. High blood pressure eventually causes the heart to become stiff and weak.  Coronary artery disease (CAD). CAD is the buildup of cholesterol and fat (plaque) in the arteries of the heart. The blockage in the arteries deprives the heart muscle of oxygen and blood. This can cause chest pain and may lead to a heart attack. High blood pressure can also contribute to CAD.  Heart attack (myocardial infarction). A heart attack occurs when one or more arteries in the heart become blocked. The loss of oxygen damages the muscle tissue of the heart. When this happens, part of the heart muscle dies. The injured tissue does not contract as well and weakens the heart's ability to pump blood.  Abnormal heart valves. When the heart valves do not open and close properly, it can cause heart failure. This makes the heart muscle pump harder to keep the blood flowing.  Heart muscle disease (cardiomyopathy or myocarditis). Heart muscle disease is damage to the heart muscle from a variety of causes. These can include drug or alcohol abuse, infections, or unknown reasons. These can increase the risk of heart failure.  Lung disease. Lung disease  makes the heart work harder because the lungs do not work properly. This can cause a strain on the heart, leading it to fail.  Diabetes. Diabetes increases the risk of heart failure. High blood sugar contributes to high fat (lipid) levels in the blood. Diabetes can also cause slow damage to tiny blood vessels that carry important nutrients to the heart muscle. When the heart does not get enough oxygen and food, it can cause the heart to become weak and stiff. This leads to a heart that does not contract efficiently.  Other conditions can contribute to heart failure. These include abnormal heart rhythms, thyroid problems, and low blood counts (anemia). Certain unhealthy behaviors can increase the risk of heart failure, including:  Being overweight.  Smoking or chewing tobacco.  Eating foods high in fat and cholesterol.  Abusing illicit drugs or alcohol.  Lacking physical activity. SYMPTOMS  Heart failure symptoms may vary and can be hard to detect. Symptoms may include:  Shortness of breath with activity, such as climbing stairs.  Persistent cough.  Swelling of the feet, ankles, legs, or abdomen.  Unexplained weight gain.  Difficulty breathing when lying flat (orthopnea).  Waking from sleep because of the need to sit up and get more air.  Rapid heartbeat.  Fatigue and loss of energy.  Feeling light-headed, dizzy, or close to fainting.  Loss of appetite.  Nausea.  Increased urination during the night (nocturia). DIAGNOSIS  A diagnosis of heart failure is based on your history, symptoms, physical examination, and diagnostic tests. Diagnostic tests for heart failure may include:  Echocardiography. °· Electrocardiography. °· Chest X-ray. °· Blood tests. °· Exercise stress test. °· Cardiac angiography. °· Radionuclide scans. °TREATMENT  °Treatment is aimed at managing the symptoms of heart failure. Medicines, behavioral changes, or surgical intervention may be necessary to  treat heart failure. °· Medicines to help treat heart failure may include: °¨ Angiotensin-converting enzyme (ACE) inhibitors. This type of medicine blocks the effects of a blood protein called angiotensin-converting enzyme. ACE inhibitors relax (dilate) the blood vessels and help lower blood pressure. °¨ Angiotensin receptor blockers (ARBs). This type of medicine blocks the actions of a blood protein called angiotensin. Angiotensin receptor blockers dilate the blood vessels and help lower blood pressure. °¨ Water pills (diuretics). Diuretics cause the kidneys to remove salt and water from the blood. The extra fluid is removed through urination. This loss of extra fluid lowers the volume of blood the heart pumps. °¨ Beta blockers. These prevent the heart from beating too fast and improve heart muscle strength. °¨ Digitalis. This increases the force of the heartbeat. °· Healthy behavior changes include: °¨ Obtaining and maintaining a healthy weight. °¨ Stopping smoking or chewing tobacco. °¨ Eating heart-healthy foods. °¨ Limiting or avoiding alcohol. °¨ Stopping illicit drug use. °¨ Physical activity as directed by your health care provider. °· Surgical treatment for heart failure may include: °¨ A procedure to open blocked arteries, repair damaged heart valves, or remove damaged heart muscle tissue. °¨ A pacemaker to improve heart muscle function and control certain abnormal heart rhythms. °¨ An internal cardioverter defibrillator to treat certain serious abnormal heart rhythms. °¨ A left ventricular assist device (LVAD) to assist the pumping ability of the heart. °HOME CARE INSTRUCTIONS  °· Take medicines only as directed by your health care provider. Medicines are important in reducing the workload of your heart, slowing the progression of heart failure, and improving your symptoms. °¨ Do not stop taking your medicine unless directed by your health care provider. °¨ Do not skip any dose of medicine. °¨ Refill your  prescriptions before you run out of medicine. Your medicines are needed every day. °· Engage in moderate physical activity if directed by your health care provider. Moderate physical activity can benefit some people. The elderly and people with severe heart failure should consult with a health care provider for physical activity recommendations. °· Eat heart-healthy foods. Food choices should be free of trans fat and low in saturated fat, cholesterol, and salt (sodium). Healthy choices include fresh or frozen fruits and vegetables, fish, lean meats, legumes, fat-free or low-fat dairy products, and whole grain or high fiber foods. Talk to a dietitian to learn more about heart-healthy foods. °· Limit sodium if directed by your health care provider. Sodium restriction may reduce symptoms of heart failure in some people. Talk to a dietitian to learn more about heart-healthy seasonings. °· Use healthy cooking methods. Healthy cooking methods include roasting, grilling, broiling, baking, poaching, steaming, or stir-frying. Talk to a dietitian to learn more about healthy cooking methods. °· Limit fluids if directed by your health care provider. Fluid restriction may reduce symptoms of heart failure in some people. °· Weigh yourself every day. Daily weights are important in the early recognition of excess fluid. You should weigh yourself every morning after you urinate and before you eat breakfast. Wear the same amount of clothing each time you weigh yourself. Record your daily weight. Provide your health care provider with your weight record. °· Monitor and record your blood pressure if directed by your health care   provider.  Check your pulse if directed by your health care provider.  Lose weight if directed by your health care provider. Weight loss may reduce symptoms of heart failure in some people.  Stop smoking or chewing tobacco. Nicotine makes your heart work harder by causing your blood vessels to constrict.  Do not use nicotine gum or patches before talking to your health care provider.  Keep all follow-up visits as directed by your health care provider. This is important.  Limit alcohol intake to no more than 1 drink per day for nonpregnant women and 2 drinks per day for men. One drink equals 12 ounces of beer, 5 ounces of wine, or 1 ounces of hard liquor. Drinking more than that is harmful to your heart. Tell your health care provider if you drink alcohol several times a week. Talk with your health care provider about whether alcohol is safe for you. If your heart has already been damaged by alcohol or you have severe heart failure, drinking alcohol should be stopped completely.  Stop illicit drug use.  Stay up-to-date with immunizations. It is especially important to prevent respiratory infections through current pneumococcal and influenza immunizations.  Manage other health conditions such as hypertension, diabetes, thyroid disease, or abnormal heart rhythms as directed by your health care provider.  Learn to manage stress.  Plan rest periods when fatigued.  Learn strategies to manage high temperatures. If the weather is extremely hot:  Avoid vigorous physical activity.  Use air conditioning or fans or seek a cooler location.  Avoid caffeine and alcohol.  Wear loose-fitting, lightweight, and light-colored clothing.  Learn strategies to manage cold temperatures. If the weather is extremely cold:  Avoid vigorous physical activity.  Layer clothes.  Wear mittens or gloves, a hat, and a scarf when going outside.  Avoid alcohol.  Obtain ongoing education and support as needed.  Participate in or seek rehabilitation as needed to maintain or improve independence and quality of life. SEEK MEDICAL CARE IF:   You have a rapid weight gain.  You have increasing shortness of breath that is unusual for you.  You are unable to participate in your usual physical activities.  You tire  easily.  You cough more than normal, especially with physical activity.  You have any or more swelling in areas such as your hands, feet, ankles, or abdomen.  You are unable to sleep because it is hard to breathe.  You feel like your heart is beating fast (palpitations).  You become dizzy or light-headed upon standing up. SEEK IMMEDIATE MEDICAL CARE IF:   You have difficulty breathing.  There is a change in mental status such as decreased alertness or difficulty with concentration.  You have a pain or discomfort in your chest.  You have an episode of fainting (syncope). MAKE SURE YOU:   Understand these instructions.  Will watch your condition.  Will get help right away if you are not doing well or get worse.   This information is not intended to replace advice given to you by your health care provider. Make sure you discuss any questions you have with your health care provider.   Document Released: 06/13/2005 Document Revised: 10/28/2014 Document Reviewed: 07/13/2012 Elsevier Interactive Patient Education 2016 [employer]   Medication Instructions:  STOP METOPROLOL   START CARVEDILOL 12.5 TWICE A DAY  Labwork: NONE  Testing/Procedures: Your physician has requested that you have en exercise stress myoview. For further information please visit [email or web address]. Please follow instruction sheet, as given.  [facility] has recommended that you have a sleep study. This test records several body functions during sleep, including: brain activity, eye movement, oxygen and carbon dioxide blood levels, heart rate and rhythm, breathing rate and rhythm, the flow of air through your mouth and nose, snoring, body muscle movements, and chest and belly movement.   Follow-Up: Your physician recommends that you schedule a follow-up appointment in: [place]

## 2016-02-11 NOTE — Progress Notes (Signed)
Cardiology Office Note   Date:  02/11/2016   ID:  [name], DOB 1960-07-31, MRN [id number]  PCP:  [name], MD  Cardiologist:   Skeet Latch, MD   No chief complaint on file.    History of Present Illness: Teresa [name] is a 55 y.o. female with hypertension and hyperthyroidism who presents for an evaluation of palpitations.  Teresa [name] saw [name], MD, on 01/05/16.  At that appointment she reported occasional palpitations and burning in her chest.  An echo was ordered and she was referred to cardiology for a stress test.  Her echo on 7/13 showed LVEF 55-60% with grade 2 diastolic dysfunction and mild MR.  Teresa [name] reports one year of palpitations and chest pain that occurs sporadically.  It lasts for a few minutes and feels like someone is sticking her with a needle.  It is associated with shortness of breath and lightheadedness but no nausea or syncope. She notes that sometimes occurs when she is having a hot flash and becomes diaphoretic.  It both at rest and with exertion. She has noticed it when laying in bed at night as well as when standing at work. 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 She notes that after standing for long periods of time she has lower extremity edema. It typically improves with elevation of her legs but not always. She denies orthopnea or PND.  She has been told that she snores loudly and it seems like she stops breathing. She does not feel well-rested upon awakening.  Teresa [name] reports feeling lightheaded and dizzy at times but denies syncope.  She has not been exercising lately but previously liked to do water aerobics. She hopes to start back doing this and walking on treadmill soon. She does not recall having chest pain or shortness of breath with these activities.  Teresa [name] is trying to quit smoking.  She is using Chantix and a vapor cigarette with a low level of nicotine.  She is down to 1-3 cigarettes  daily.   Past Medical History:  Diagnosis Date  . Anemia   . Chronic lower back pain   . GERD (gastroesophageal reflux disease)   . H/O hiatal hernia   . History of amputation of lesser toe of left foot ([place]) 03/11/2015  . Hypertension   . Hyperthyroidism    hx  . Hypothyroidism   . Migraine headache    "a few times/yr" (03/05/2015)  . Neck strain 02/06/2012  . Osteomyelitis ([place])    [name] 03/05/2015  . Osteomyelitis of toe of left foot ([place]) 03/05/2015  . Positive TB test     Past Surgical History:  Procedure Laterality Date  . ABDOMINAL HYSTERECTOMY  2006  . AMPUTATION TOE Left 03/05/2015   Procedure: AMPUTATION LEFT 5TH  TOE;  Surgeon: Wylene Simmer, MD;  Location: [facility];  Service: Orthopedics;  Laterality: Left;  . APPENDECTOMY  2006  . [place]  . TOE AMPUTATION Left 03/05/2015   5th toe  . TUBAL LIGATION Bilateral 1990     Current Outpatient Prescriptions  Medication Sig Dispense Refill  . amLODipine (NORVASC) 10 MG tablet Take 1 tablet (10 mg total) by mouth at bedtime. 90 tablet 1  . BIOTIN PO Take 1 tablet by mouth daily.     . Cyanocobalamin (B-12 PO) Take 1 tablet by mouth daily.     . cycloSPORINE (RESTASIS) 0.05 % ophthalmic emulsion  Place 1 drop into both eyes 2 (two) times daily.     . Hypromellose (ARTIFICIAL TEARS OP) Place 1 drop into both eyes as needed (for dry eyes). Reported on 08/13/2015    . ibuprofen (ADVIL,MOTRIN) 600 MG tablet Take 1 tablet (600 mg total) by mouth 3 (three) times daily after meals. (Patient taking differently: Take 600 mg by mouth every 8 (eight) hours as needed. ) 30 tablet 0  . levothyroxine (SYNTHROID, LEVOTHROID) 112 MCG tablet Take 1 tablet (112 mcg total) by mouth daily before breakfast. 90 tablet 1  . methocarbamol (ROBAXIN) 500 MG tablet Take 2 tablets (1,000 mg total) by mouth every 8 (eight) hours as needed for muscle spasms. 30 tablet 0  . mometasone (NASONEX) 50 MCG/ACT nasal spray Place 2 sprays into the  nose daily. 17 g 12  . spironolactone (ALDACTONE) 25 MG tablet Take 1 tablet (25 mg total) by mouth daily. 90 tablet 1  . carvedilol (COREG) 12.5 MG tablet Take 1 tablet (12.5 mg total) by mouth 2 (two) times daily. 60 tablet 5   No current facility-administered medications for this visit.     Allergies:   Clonidine derivatives; Aspirin; and Latex    Social History:  The patient  reports that she has been smoking Cigarettes.  She has been smoking about 0.00 packs per day for the past 36.00 years. She has never used smokeless tobacco. She reports that she does not drink alcohol or use drugs.   Family History:  The patient's family history includes Cancer (age of onset: 91) in her father; Diabetes in her brother, mother, other, and sister; Heart disease in her mother and other; Kidney disease in her brother and sister; Stroke in her father.    ROS:  Please see the history of present illness.   Otherwise, review of systems are positive for L sided pain and numbness.   All other systems are reviewed and negative.    PHYSICAL EXAM: VS:  BP (!) 170/90 (BP Location: Right Arm, Patient Position: Sitting, Cuff Size: Normal)   Pulse (!) 59   Ht 5\' 5"  (1.651 m)   Wt 188 lb 6.4 oz (85.5 kg)   BMI 31.35 kg/m  , BMI Body mass index is 31.35 kg/m. GENERAL:  Well appearing HEENT:  Pupils equal round and reactive, fundi not visualized, oral mucosa unremarkable NECK:  No jugular venous distention, waveform within normal limits, carotid upstroke brisk and symmetric, no bruits, no thyromegaly LYMPHATICS:  No cervical adenopathy LUNGS:  Clear to auscultation bilaterally HEART:  RRR.  PMI not displaced or sustained,S1 and S2 within normal limits, no S3, no S4, no clicks, no rubs, no murmurs ABD:  Flat, positive bowel sounds normal in frequency in pitch, no bruits, no rebound, no guarding, no midline pulsatile mass, no hepatomegaly, no splenomegaly EXT:  2 plus pulses throughout, no edema, no cyanosis no  clubbing SKIN:  No rashes no nodules NEURO:  Cranial nerves II through XII grossly intact, motor grossly intact throughout PSYCH:  Cognitively intact, oriented to person place and time    EKG:  EKG is ordered today. The ekg ordered today demonstrates sinus bradycardia.  Rate 59 bpm.  Inferolateral TWI, concerning for inferolateral ischemia.  Unchanged from 03/09/15.   Recent Labs: 12/10/2015: ALT 19; Hemoglobin 13.0; Platelets 272 01/05/2016: BUN 13; Creat 0.88; Potassium 4.0; Sodium 141; TSH 1.89    Lipid Panel    Component Value Date/Time   CHOL 159 10/27/2015 0954   TRIG 37 10/27/2015 0954  HDL 47 10/27/2015 0954   CHOLHDL 3.4 10/27/2015 0954   VLDL 7 10/27/2015 0954   LDLCALC 105 10/27/2015 0954      Wt Readings from Last 3 Encounters:  02/11/16 188 lb 6.4 oz (85.5 kg)  02/10/16 187 lb (84.8 kg)  01/20/16 187 lb (84.8 kg)      ASSESSMENT AND PLAN:  # Atypical chest pain: Teresa [name] has atypical chest pain.  However, she does not exert herself enough to know if she has exertional symptoms.  We will obtain an exercise Myoview to evaluate for ischemia.   # Hypertension: Blood pressure is poorly-controlled.  It was 154/80 on repeat. We will stop metoprolol and switch to carvedilol 12.5 mg twice a day.   Continue amlodipine and spironolactone.   # Chronic diastolic heart failure: Teresa [name] appears euvolemic on exam.  BP control as above.  Continue spironolactone for volume management.  We discussed the importance of limiting her salt and fluid intake.  # Palpitations: This only occurs in the setting of her chest pain. We will start with stress testing as above. Thyroid function, electrolytes, and CBC were within normal limits recently. If no arrhythmias are noted on her stress test we will consider a Holter in the future.   # Snoring, apnea: Sleep study  Current medicines are reviewed at length with the patient today.  The patient does not have concerns regarding  medicines.  The following changes have been made:  Switch metoprolol to carvedilol   Labs/ tests ordered today include:   Orders Placed This Encounter  Procedures  . Myocardial Perfusion Imaging  . Split night study     Disposition:   FU with Keyonta Madrid C. Oval Linsey, MD, [facility] in 1 month    This note was written with the assistance of speech recognition software.  Please excuse any transcriptional errors.  Signed, Dave Mannes C. Oval Linsey, MD, [facility]  02/11/2016 8:55 AM    [place]

## 2016-02-11 NOTE — Addendum Note (Signed)
Addended by: Alvina Filbert B on: 02/11/2016 08:57 AM   Modules accepted: Orders

## 2016-02-16 IMAGING — CR DG CERVICAL SPINE COMPLETE 4+V
5 series · 5 of 5 positions shown · non-contrast
Comparison: None.

CLINICAL DATA: Neck pain, spasm

EXAM:
CERVICAL SPINE  4+ VIEWS

[w c-spine lat]
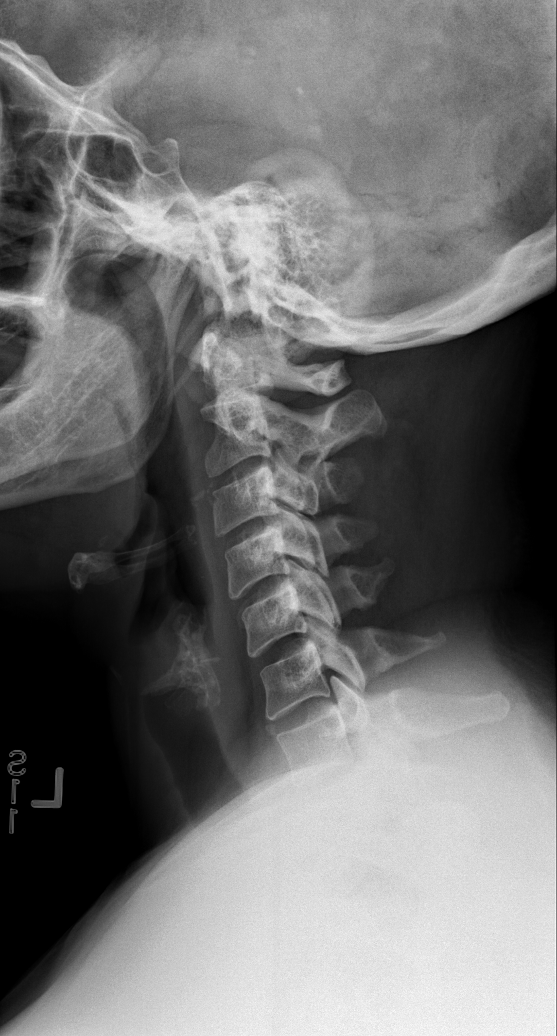

[w c-spine oblique (1 of 2)]
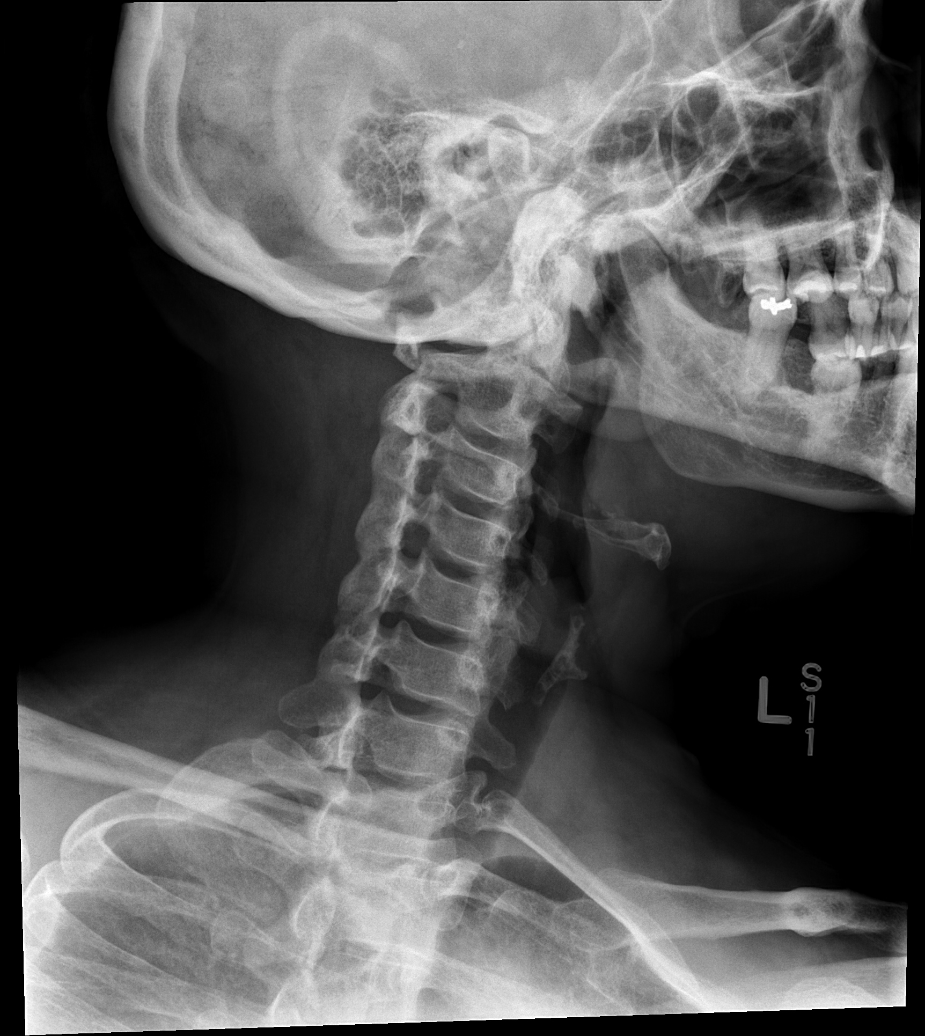

[w c-spine oblique (2 of 2)]
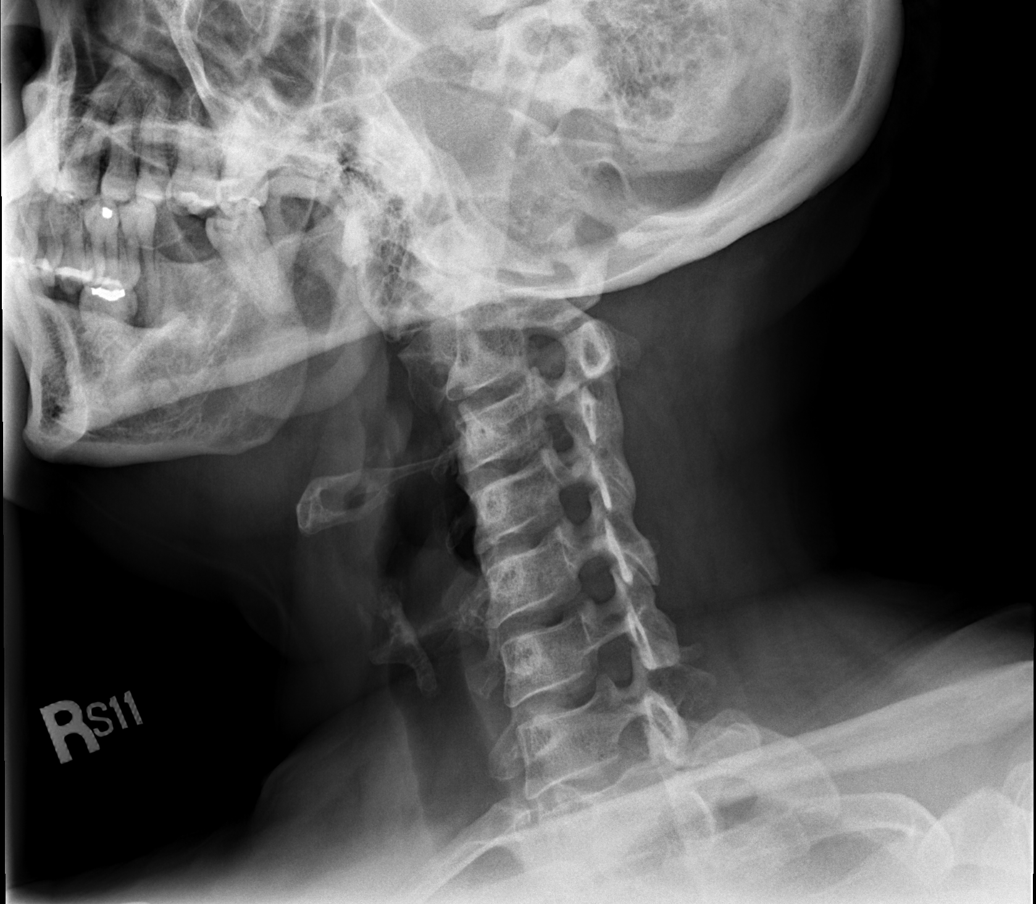

[w c-spine a.p.]
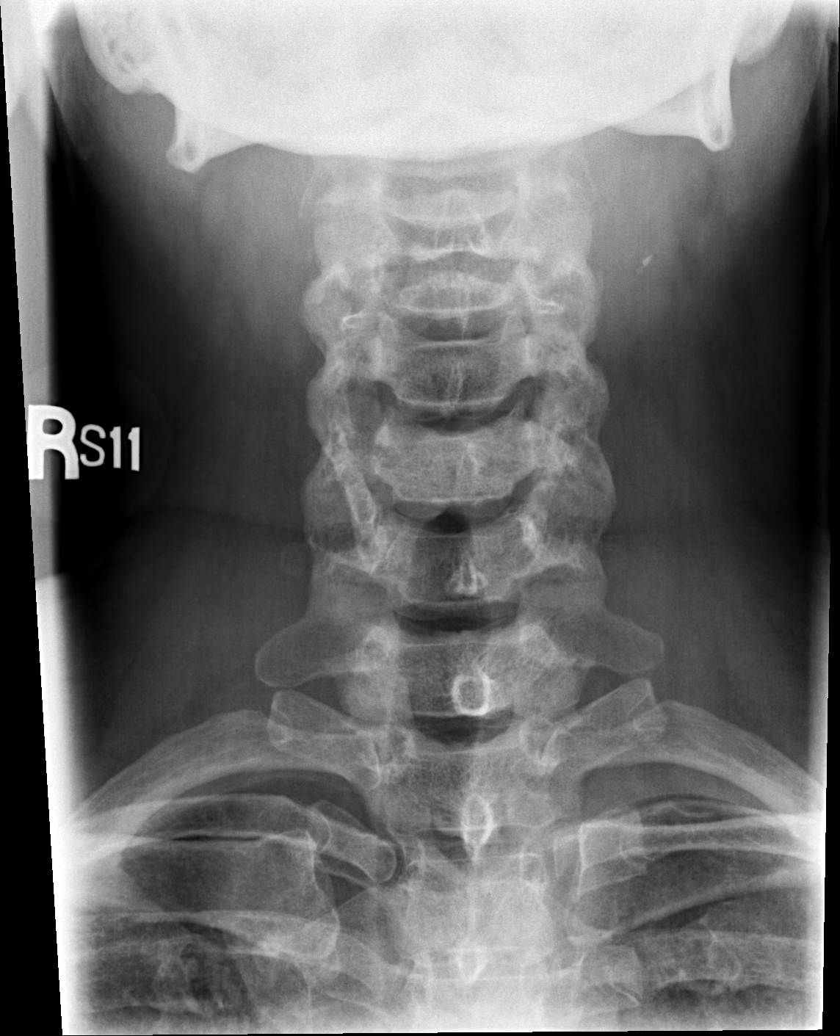

[w c-spine odontoid]
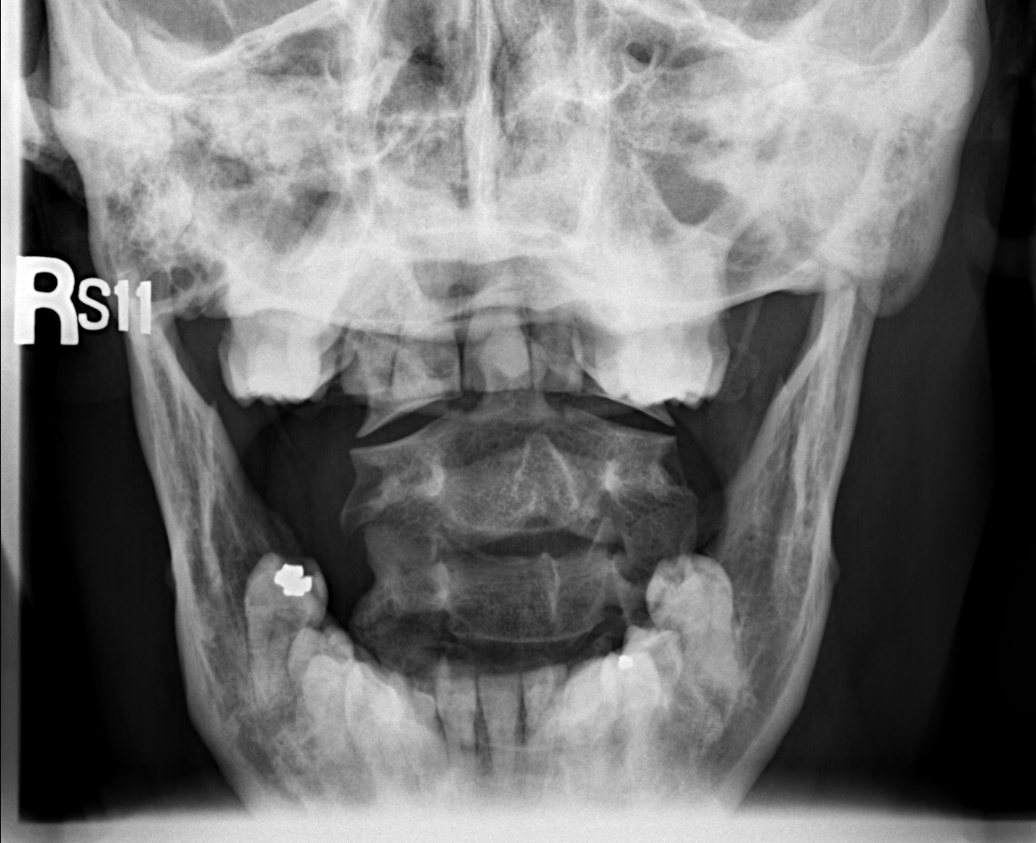

[5 of 5 positions shown; findings below may reference images not displayed]

FINDINGS: There is no evidence of cervical spine fracture or prevertebral soft
tissue swelling. Alignment is normal. No other significant bone
abnormalities are identified.

Mild left carotid artery atherosclerosis.
IMPRESSION: Negative cervical spine radiographs.

## 2016-02-16 IMAGING — CR DG NECK SOFT TISSUE
2 series · 2 of 2 positions shown · non-contrast
Comparison: Cervical spine films of 04/15/2014

CLINICAL DATA: Neck pain for 2 years, no acute trauma

EXAM:
NECK SOFT TISSUES - 1+ VIEW

[w soft tissue neck ap]
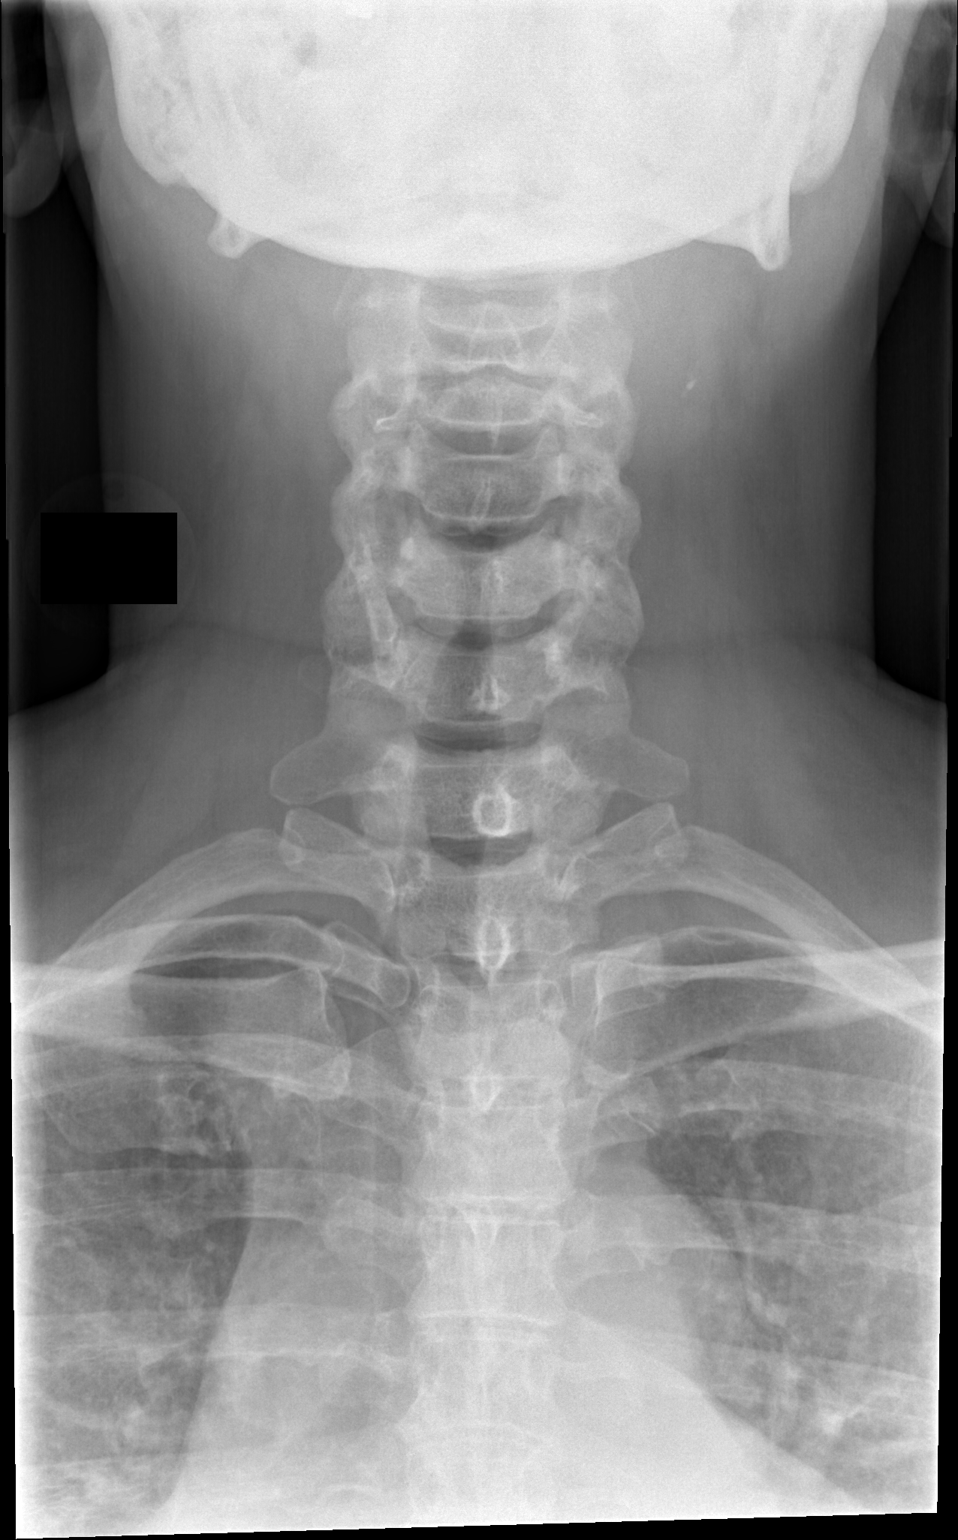

[w soft tissue neck]
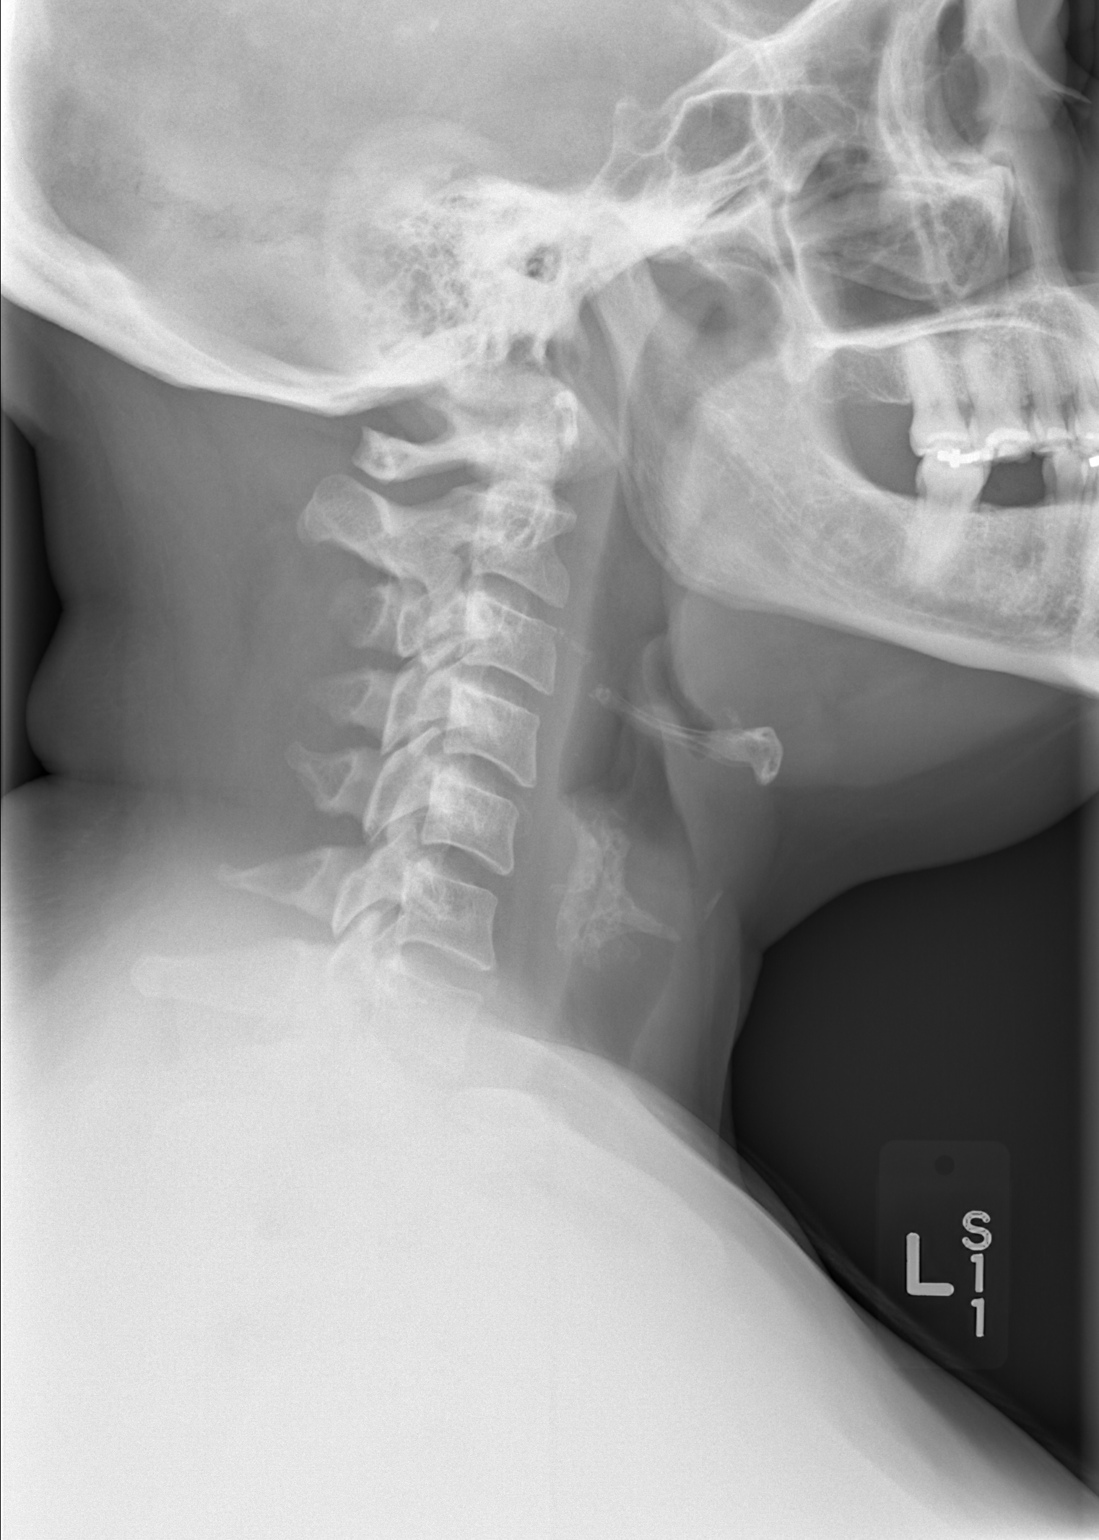

[2 of 2 positions shown; findings below may reference images not displayed]

FINDINGS: The cervical vertebrae remain straightened in alignment.
Intervertebral disc spaces appear normal. No significant
degenerative disc disease is seen. No prevertebral soft tissue
swelling is noted. The lung apices are clear.
IMPRESSION: Straightened alignment with normal intervertebral disc spaces.

## 2016-02-18 ENCOUNTER — Ambulatory Visit
Admission: RE | Admit: 2016-02-18 | Discharge: 2016-02-18 | Disposition: A | Discharge status: Home / Self Care | Payer: BLUE CROSS/BLUE SHIELD | Source: Ambulatory Visit | Attending: Student | Admitting: Student

## 2016-02-18 DIAGNOSIS — M5010 Cervical disc disorder with radiculopathy, unspecified cervical region: Secondary | ICD-10-CM

## 2016-02-18 DIAGNOSIS — M67912 Unspecified disorder of synovium and tendon, left shoulder: Secondary | ICD-10-CM

## 2016-02-18 DIAGNOSIS — M542 Cervicalgia: Secondary | ICD-10-CM

## 2016-02-19 ENCOUNTER — Telehealth (HOSPITAL_COMMUNITY): Payer: Self-pay | Admitting: *Deleted

## 2016-02-19 NOTE — Telephone Encounter (Signed)
Close encounter 

## 2016-02-23 ENCOUNTER — Ambulatory Visit (HOSPITAL_COMMUNITY)
Admission: RE | Admit: 2016-02-23 | Discharge: 2016-02-23 | Disposition: A | Discharge status: Home / Self Care | Payer: BLUE CROSS/BLUE SHIELD | Source: Ambulatory Visit | Attending: Internal Medicine | Admitting: Internal Medicine

## 2016-02-23 DIAGNOSIS — Z720 Tobacco use: Secondary | ICD-10-CM | POA: Diagnosis not present

## 2016-02-23 DIAGNOSIS — E663 Overweight: Secondary | ICD-10-CM | POA: Diagnosis not present

## 2016-02-23 DIAGNOSIS — Z8249 Family history of ischemic heart disease and other diseases of the circulatory system: Secondary | ICD-10-CM | POA: Diagnosis not present

## 2016-02-23 DIAGNOSIS — R42 Dizziness and giddiness: Secondary | ICD-10-CM | POA: Diagnosis not present

## 2016-02-23 DIAGNOSIS — Z6831 Body mass index (BMI) 31.0-31.9, adult: Secondary | ICD-10-CM | POA: Insufficient documentation

## 2016-02-23 DIAGNOSIS — R0602 Shortness of breath: Secondary | ICD-10-CM | POA: Insufficient documentation

## 2016-02-23 DIAGNOSIS — R5383 Other fatigue: Secondary | ICD-10-CM | POA: Insufficient documentation

## 2016-02-23 DIAGNOSIS — R002 Palpitations: Secondary | ICD-10-CM | POA: Diagnosis not present

## 2016-02-23 DIAGNOSIS — R079 Chest pain, unspecified: Secondary | ICD-10-CM

## 2016-02-23 DIAGNOSIS — I10 Essential (primary) hypertension: Secondary | ICD-10-CM | POA: Diagnosis not present

## 2016-02-23 DIAGNOSIS — E039 Hypothyroidism, unspecified: Secondary | ICD-10-CM | POA: Diagnosis not present

## 2016-02-23 DIAGNOSIS — K219 Gastro-esophageal reflux disease without esophagitis: Secondary | ICD-10-CM | POA: Diagnosis not present

## 2016-02-23 DIAGNOSIS — R0683 Snoring: Secondary | ICD-10-CM | POA: Insufficient documentation

## 2016-02-23 MED ORDER — TECHNETIUM TC 99M TETROFOSMIN IV KIT
28.1000 | PACK | Freq: Once | INTRAVENOUS | Status: AC | PRN
  Administered 2016-02-23: 28.1 via INTRAVENOUS
  Filled 2016-02-23: qty 28

## 2016-02-23 MED ORDER — REGADENOSON 0.4 MG/5ML IV SOLN
0.4000 mg | Freq: Once | INTRAVENOUS | Status: AC
  Administered 2016-02-23: 0.4 mg via INTRAVENOUS

## 2016-02-23 MED ORDER — AMINOPHYLLINE 25 MG/ML IV SOLN
75.0000 mg | Freq: Once | INTRAVENOUS | Status: AC
  Administered 2016-02-23: 75 mg via INTRAVENOUS

## 2016-02-24 ENCOUNTER — Ambulatory Visit (HOSPITAL_COMMUNITY)
Admission: RE | Admit: 2016-02-24 | Discharge: 2016-02-24 | Disposition: A | Discharge status: Home / Self Care | Payer: BLUE CROSS/BLUE SHIELD | Source: Ambulatory Visit | Attending: Cardiology | Admitting: Cardiology

## 2016-02-24 LAB — MYOCARDIAL PERFUSION IMAGING
LV dias vol: 131 mL (ref 46–106)
LV sys vol: 70 mL
Peak HR: 80 {beats}/min
Rest HR: 57 {beats}/min
SDS: 5
SRS: 0
SSS: 5
TID: 1.25

## 2016-02-24 MED ORDER — TECHNETIUM TC 99M TETROFOSMIN IV KIT
29.0000 | PACK | Freq: Once | INTRAVENOUS | Status: AC | PRN
  Administered 2016-02-24: 29 via INTRAVENOUS

## 2016-02-25 ENCOUNTER — Encounter: Payer: Self-pay | Admitting: Family Medicine

## 2016-03-01 ENCOUNTER — Other Ambulatory Visit: Payer: Self-pay | Admitting: *Deleted

## 2016-03-01 DIAGNOSIS — M542 Cervicalgia: Secondary | ICD-10-CM

## 2016-03-11 ENCOUNTER — Encounter: Payer: Self-pay | Admitting: Family Medicine

## 2016-03-11 ENCOUNTER — Ambulatory Visit (INDEPENDENT_AMBULATORY_CARE_PROVIDER_SITE_OTHER): Payer: BLUE CROSS/BLUE SHIELD | Admitting: Family Medicine

## 2016-03-11 VITALS — BP 167/87 | HR 67 | Temp 98.3°F | Wt 190.0 lb

## 2016-03-11 DIAGNOSIS — H9209 Otalgia, unspecified ear: Secondary | ICD-10-CM

## 2016-03-11 DIAGNOSIS — E039 Hypothyroidism, unspecified: Secondary | ICD-10-CM

## 2016-03-11 DIAGNOSIS — E079 Disorder of thyroid, unspecified: Secondary | ICD-10-CM

## 2016-03-11 DIAGNOSIS — R7309 Other abnormal glucose: Secondary | ICD-10-CM | POA: Diagnosis not present

## 2016-03-11 DIAGNOSIS — R7303 Prediabetes: Secondary | ICD-10-CM | POA: Diagnosis not present

## 2016-03-11 DIAGNOSIS — H9202 Otalgia, left ear: Secondary | ICD-10-CM

## 2016-03-11 DIAGNOSIS — G8929 Other chronic pain: Secondary | ICD-10-CM

## 2016-03-11 DIAGNOSIS — I10 Essential (primary) hypertension: Secondary | ICD-10-CM

## 2016-03-11 DIAGNOSIS — R739 Hyperglycemia, unspecified: Secondary | ICD-10-CM | POA: Diagnosis not present

## 2016-03-11 DIAGNOSIS — R42 Dizziness and giddiness: Secondary | ICD-10-CM | POA: Diagnosis not present

## 2016-03-11 HISTORY — DX: Otalgia, unspecified ear: H92.09

## 2016-03-11 LAB — POCT HEMOGLOBIN: Hemoglobin: 11.7 g/dL — AB (ref 12.2–16.2)

## 2016-03-11 LAB — POCT GLYCOSYLATED HEMOGLOBIN (HGB A1C): Hemoglobin A1C: 6.1

## 2016-03-11 LAB — TSH: TSH: 0.86 mIU/L

## 2016-03-11 MED ORDER — OLOPATADINE HCL 0.6 % NA SOLN: 2.0000 [drp] | Freq: Two times a day (BID) | NASAL | 2 refills | Status: DC

## 2016-03-11 NOTE — Assessment & Plan Note (Signed)
No acute change. A1C checked today was 6.1 Continue diet and exercise control. F/U in 3 months.

## 2016-03-11 NOTE — Addendum Note (Signed)
Addended by: [name] T on: 03/11/2016 12:17 PM   Modules accepted: Orders

## 2016-03-11 NOTE — Patient Instructions (Signed)
Dizziness °Dizziness is a common problem. It makes you feel unsteady or lightheaded. You may feel like you are about to pass out (faint). Dizziness can lead to injury if you stumble or fall. Anyone can get dizzy, but dizziness is more common in older adults. This condition can be caused by a number of things, including: °· Medicines. °· Dehydration. °· Illness. °HOME CARE °Following these instructions may help with your condition: °Eating and Drinking °· Drink enough fluid to keep your pee (urine) clear or pale yellow. This helps to keep you from getting dehydrated. Try to drink more clear fluids, such as water. °· Do not drink alcohol. °· Limit how much caffeine you drink or eat if told by your doctor. °· Limit how much salt you drink or eat if told by your doctor. °Activity °· Avoid making quick movements. °¨ When you stand up from sitting in a chair, steady yourself until you feel okay. °¨ In the morning, first sit up on the side of the bed. When you feel okay, stand slowly while you hold onto something. Do this until you know that your balance is fine. °· Move your legs often if you need to stand in one place for a long time. Tighten and relax your muscles in your legs while you are standing. °· Do not drive or use heavy machinery if you feel dizzy. °· Avoid bending down if you feel dizzy. Place items in your home so that they are easy for you to reach without leaning over. °Lifestyle °· Do not use any tobacco products, including cigarettes, chewing tobacco, or electronic cigarettes. If you need help quitting, ask your doctor. °· Try to lower your stress level, such as with yoga or meditation. Talk with your doctor if you need help. °General Instructions °· Watch your dizziness for any changes. °· Take medicines only as told by your doctor. Talk with your doctor if you think that your dizziness is caused by a medicine that you are taking. °· Tell a friend or a family member that you are feeling dizzy. If he or  she notices any changes in your behavior, have this person call your doctor. °· Keep all follow-up visits as told by your doctor. This is important. °GET HELP IF: °· Your dizziness does not go away. °· Your dizziness or light-headedness gets worse. °· You feel sick to your stomach (nauseous). °· You have trouble hearing. °· You have new symptoms. °· You are unsteady on your feet or you feel like the room is spinning. °GET HELP RIGHT AWAY IF: °· You throw up (vomit) or have diarrhea and are unable to eat or drink anything. °· You have trouble: °¨ Talking. °¨ Walking. °¨ Swallowing. °¨ Using your arms, hands, or legs. °· You feel generally weak. °· You are not thinking clearly or you have trouble forming sentences. It may take a friend or family member to notice this. °· You have: °¨ Chest pain. °¨ Pain in your belly (abdomen). °¨ Shortness of breath. °¨ Sweating. °· Your vision changes. °· You are bleeding. °· You have a headache. °· You have neck pain or a stiff neck. °· You have a fever. °  °This information is not intended to replace advice given to you by your health care provider. Make sure you discuss any questions you have with your health care provider. °  °Document Released: 06/02/2011 Document Revised: 10/28/2014 Document Reviewed: 06/09/2014 °Elsevier Interactive Patient Education ©2016 Elsevier Inc. ° °

## 2016-03-11 NOTE — Assessment & Plan Note (Signed)
TSH checked. I will contact her with result and adjust meds as needed.

## 2016-03-11 NOTE — Assessment & Plan Note (Addendum)
Left ear looks inflammed. ?? Related to allergy. ?? Otitis externa although hx does not fit. Ciprodex prescribed. Patanase for allergy and congestion. Given long standing issue, will refer again to ENT.

## 2016-03-11 NOTE — Assessment & Plan Note (Addendum)
Etiology unclear. Currently asymptomatic. Differentials include vertigo, anemia, anxiety, orthostatic BP, middle ear problem. No neurologic deficit. HEENT exam benign except for left ear. Orthostatic BP measurement equivocal. Might be a bit contributory. Hemoglobin checked around baseline at 11.9 I discussed orthostatic hypotension and management. Will treat ear concern to see if this will improve her symptoms. Do not feel compelled to treat for vertigo, no nystagmus on laying down on the table. Consider CT head in the future if no improvement. Return precaution discussed.

## 2016-03-11 NOTE — Progress Notes (Signed)
Subjective:     Patient ID: Teresa [name], female   DOB: Jan 04, 1961, 55 y.o.   MRN: [id number]  HPI Dizziness: feels like she is off balance, denies feeling of room spinning, happens when standing, walking, sitting. No fall, no head injury, occasional pressure headache on her face. No hearing loss. No tinnitus. On and off blurry vision. Last felt dizzy 4 days ago, each episodes last 2-3 min, she will keep calm and then it goes away.  Ear pain: C/O left ear pain for years. This is associated with feeling of fullness in her left ear. Previously she used to have tinnitus in the past but after a large amount of debris was removed from her left ear by [facility]NT her symptoms subsided.In addition she has throat irritation and sinus congestion. Denies hearing loss or ear discharge. Here for assessment.  Pre-DM: Here for follow up. She tries to work on diet but not so great. Thyroid dysfunction: Compliant with her synthroid. Here for follow up. HTN: She claimed that her home BP runs in the 140/80. Last number was 145/87, denies any neurologic, cardiovascular or respiratory symptoms. HM: Here to follow up for routine health care.  Current Outpatient Prescriptions on File Prior to Visit  Medication Sig Dispense Refill  . amLODipine (NORVASC) 10 MG tablet Take 1 tablet (10 mg total) by mouth at bedtime. 90 tablet 1  . carvedilol (COREG) 12.5 MG tablet Take 1 tablet (12.5 mg total) by mouth 2 (two) times daily. 60 tablet 5  . levothyroxine (SYNTHROID, LEVOTHROID) 112 MCG tablet Take 1 tablet (112 mcg total) by mouth daily before breakfast. 90 tablet 1  . spironolactone (ALDACTONE) 25 MG tablet Take 1 tablet (25 mg total) by mouth daily. 90 tablet 1  . BIOTIN PO Take 1 tablet by mouth daily.     . Cyanocobalamin (B-12 PO) Take 1 tablet by mouth daily.     . cycloSPORINE (RESTASIS) 0.05 % ophthalmic emulsion Place 1 drop into both eyes 2 (two) times daily.     . Hypromellose (ARTIFICIAL TEARS OP) Place 1  drop into both eyes as needed (for dry eyes). Reported on 08/13/2015    . ibuprofen (ADVIL,MOTRIN) 600 MG tablet Take 1 tablet (600 mg total) by mouth 3 (three) times daily after meals. (Patient taking differently: Take 600 mg by mouth every 8 (eight) hours as needed. ) 30 tablet 0  . methocarbamol (ROBAXIN) 500 MG tablet Take 2 tablets (1,000 mg total) by mouth every 8 (eight) hours as needed for muscle spasms. (Patient not taking: Reported on 03/11/2016) 30 tablet 0  . mometasone (NASONEX) 50 MCG/ACT nasal spray Place 2 sprays into the nose daily. (Patient not taking: Reported on 03/11/2016) 17 g 12   No current facility-administered medications on file prior to visit.    Past Medical History:  Diagnosis Date  . Anemia   . Chronic lower back pain   . GERD (gastroesophageal reflux disease)   . H/O hiatal hernia   . History of amputation of lesser toe of left foot ([place]) 03/11/2015  . Hypertension   . Hyperthyroidism    hx  . Hypothyroidism   . Migraine headache    "a few times/yr" (03/05/2015)  . Neck strain 02/06/2012  . Osteomyelitis ([place])    [name] 03/05/2015  . Osteomyelitis of toe of left foot ([place]) 03/05/2015  . Positive TB test      Review of Systems  HENT: Positive for congestion, ear pain and sinus pressure. Negative for ear  discharge, facial swelling, hearing loss, postnasal drip and trouble swallowing.   Respiratory: Negative.   Cardiovascular: Negative.  Negative for palpitations.  Gastrointestinal: Negative.   Genitourinary: Negative.   Neurological: Positive for dizziness. Negative for tremors, syncope and numbness.  All other systems reviewed and are negative.      Vitals:   03/11/16 0924  BP: (!) 167/87  Pulse: 67  Temp: 98.3 F (36.8 C)  TempSrc: Oral  Weight: 190 lb (86.2 kg)   Objective:   Physical Exam  Constitutional: She is oriented to person, place, and time. She appears well-developed. No distress.  HENT:  Head: Normocephalic and atraumatic.  Right  Ear: Tympanic membrane and ear canal normal. No drainage, swelling or tenderness. No mastoid tenderness. No middle ear effusion. No decreased hearing is noted.  Left Ear: Tympanic membrane normal. There is tenderness. No drainage. No mastoid tenderness.  No middle ear effusion.  Ears:  Mouth/Throat: Uvula is midline, oropharynx is clear and moist and mucous membranes are normal.  Neck: No neck rigidity. No thyromegaly present.  Cardiovascular: Normal rate, regular rhythm and normal heart sounds.   No murmur heard. Pulmonary/Chest: Effort normal and breath sounds normal. No respiratory distress. She exhibits no tenderness.  Abdominal: Soft. She exhibits no distension. There is no tenderness.  Musculoskeletal: Normal range of motion. She exhibits no edema.  Neurological: She is alert and oriented to person, place, and time. She has normal strength. No cranial nerve deficit or sensory deficit. She displays a negative Romberg sign.  Nursing note and vitals reviewed.      Assessment:     Dizziness: Pre-DM: Thyroid dysfunction: HTN HM:    Plan:     Check problem list.  Note: She deferred flu shot which was offered today.

## 2016-03-11 NOTE — Assessment & Plan Note (Signed)
BP still not very well controlled although she claimed her numbers are good at home. I will watch her for now on current regimen. Continue home BP monitoring. F/U soon if consistently elevated, otherwise I will see her back in 3 months. She verbalized understanding.

## 2016-03-14 ENCOUNTER — Encounter: Payer: Self-pay | Admitting: Family Medicine

## 2016-03-15 ENCOUNTER — Telehealth: Payer: Self-pay | Admitting: *Deleted

## 2016-03-15 MED ORDER — CIPROFLOXACIN-DEXAMETHASONE 0.3-0.1 % OT SUSP: 4.0000 [drp] | Freq: Two times a day (BID) | OTIC | 0 refills | Status: DC

## 2016-03-15 MED ORDER — OFLOXACIN 0.3 % OT SOLN: 10.0000 [drp] | Freq: Every day | OTIC | 0 refills | Status: DC

## 2016-03-15 NOTE — Addendum Note (Signed)
Addended by: [name] T on: 03/15/2016 08:28 AM   Modules accepted: Orders

## 2016-03-15 NOTE — Telephone Encounter (Signed)
Reviewed prior auth form with Latina Craver. Reviewed note from Dr. Gwendlyn Deutscher (left ear otitis externa, no evidence of TM rupture). Prescription for ofloxacin sent to pharmacy.

## 2016-03-15 NOTE — Telephone Encounter (Signed)
Prior Authorization received from [employer] for [place]. Medication is a tier 4, cost $563 and requires PA.  Ofloxacin 0.3% is a Tier 2, no PA required. Will forward to Dr. Ree Kida.  Derl Barrow, RN

## 2016-03-15 NOTE — Telephone Encounter (Signed)
-----   Message from Kinnie Feil, MD sent at 03/12/2016  8:23 AM EDT ----- Please advise patient her TSH looks fine. I will like her to have a recheck in 4-6 months.

## 2016-03-25 ENCOUNTER — Ambulatory Visit: Payer: BLUE CROSS/BLUE SHIELD | Admitting: Cardiovascular Disease

## 2016-03-25 NOTE — Progress Notes (Deleted)
Cardiology Office Note   Date:  03/25/2016   ID:  [name], DOB 1960/10/22, MRN [id number]  PCP:  [name], MD  Cardiologist:   Skeet Latch, MD   No chief complaint on file.    History of Present Illness: Teresa [name] is a 55 y.o. female with hypertension, palpitations, and hyperthyroidism who presents for follow up.  Ms. Anzaldua saw [name], MD, on 01/05/16.  At that appointment she reported occasional palpitations and burning in her chest.  She had an echo on 7/13 showed LVEF 55-60% with grade 2 diastolic dysfunction and mild MR.  Ms. Tweet reports one year of palpitations and chest pain that occurs sporadically.  She was referred for [facility] that revealed LVEF 47% and no ischemia.  At her appointment on 01/2016 her blood pressure was elevated so she was switched from metoprolol to carvedilol.    Ms. Hinz reports feeling lightheaded and dizzy at times but denies syncope.  She has not been exercising lately but previously liked to do water aerobics. She hopes to start back doing this and walking on treadmill soon. She does not recall having chest pain or shortness of breath with these activities.  Ms. Lyster is trying to quit smoking.  She is using Chantix and a vapor cigarette with a low level of nicotine.  She is down to 1-3 cigarettes daily.  ?   Past Medical History:  Diagnosis Date  . Anemia   . Chronic lower back pain   . GERD (gastroesophageal reflux disease)   . H/O hiatal hernia   . History of amputation of lesser toe of left foot ([place]) 03/11/2015  . Hypertension   . Hyperthyroidism    hx  . Hypothyroidism   . Migraine headache    "a few times/yr" (03/05/2015)  . Neck strain 02/06/2012  . Osteomyelitis ([place])    [name] 03/05/2015  . Osteomyelitis of toe of left foot ([place]) 03/05/2015  . Palpitations 01/05/2016  . Positive TB test     Past Surgical History:  Procedure Laterality Date  . ABDOMINAL HYSTERECTOMY  2006  . AMPUTATION TOE Left  03/05/2015   Procedure: AMPUTATION LEFT 5TH  TOE;  Surgeon: Wylene Simmer, MD;  Location: [place];  Service: Orthopedics;  Laterality: Left;  . APPENDECTOMY  2006  . [place]  . TOE AMPUTATION Left 03/05/2015   5th toe  . TUBAL LIGATION Bilateral 1990     Current Outpatient Prescriptions  Medication Sig Dispense Refill  . amLODipine (NORVASC) 10 MG tablet Take 1 tablet (10 mg total) by mouth at bedtime. 90 tablet 1  . BIOTIN PO Take 1 tablet by mouth daily.     . carvedilol (COREG) 12.5 MG tablet Take 1 tablet (12.5 mg total) by mouth 2 (two) times daily. 60 tablet 5  . Cyanocobalamin (B-12 PO) Take 1 tablet by mouth daily.     . cycloSPORINE (RESTASIS) 0.05 % ophthalmic emulsion Place 1 drop into both eyes 2 (two) times daily.     . Hypromellose (ARTIFICIAL TEARS OP) Place 1 drop into both eyes as needed (for dry eyes). Reported on 08/13/2015    . ibuprofen (ADVIL,MOTRIN) 600 MG tablet Take 1 tablet (600 mg total) by mouth 3 (three) times daily after meals. (Patient taking differently: Take 600 mg by mouth every 8 (eight) hours as needed. ) 30 tablet 0  . levothyroxine (SYNTHROID, LEVOTHROID) 112 MCG tablet Take 1 tablet (112 mcg total) by mouth daily before  breakfast. 90 tablet 1  . methocarbamol (ROBAXIN) 500 MG tablet Take 2 tablets (1,000 mg total) by mouth every 8 (eight) hours as needed for muscle spasms. (Patient not taking: Reported on 03/11/2016) 30 tablet 0  . mometasone (NASONEX) 50 MCG/ACT nasal spray Place 2 sprays into the nose daily. (Patient not taking: Reported on 03/11/2016) 17 g 12  . ofloxacin (FLOXIN) 0.3 % otic solution Place 10 drops into the left ear daily. For 7 days. 5 mL 0  . Olopatadine HCl 0.6 % SOLN Place 2 drops into the nose 2 (two) times daily. 1 Bottle 2  . spironolactone (ALDACTONE) 25 MG tablet Take 1 tablet (25 mg total) by mouth daily. 90 tablet 1   No current facility-administered medications for this visit.     Allergies:   Clonidine  derivatives; Aspirin; and Latex    Social History:  The patient  reports that she has been smoking Cigarettes.  She has been smoking about 0.00 packs per day for the past 36.00 years. She has never used smokeless tobacco. She reports that she does not drink alcohol or use drugs.   Family History:  The patient's family history includes Cancer (age of onset: 74) in her father; Diabetes in her brother, mother, other, and sister; Heart disease in her mother and other; Kidney disease in her brother and sister; Stroke in her father.    ROS:  Please see the history of present illness.   Otherwise, review of systems are positive for L sided pain and numbness.   All other systems are reviewed and negative.    PHYSICAL EXAM: VS:  There were no vitals taken for this visit. , BMI There is no height or weight on file to calculate BMI. GENERAL:  Well appearing HEENT:  Pupils equal round and reactive, fundi not visualized, oral mucosa unremarkable NECK:  No jugular venous distention, waveform within normal limits, carotid upstroke brisk and symmetric, no bruits, no thyromegaly LYMPHATICS:  No cervical adenopathy LUNGS:  Clear to auscultation bilaterally HEART:  RRR.  PMI not displaced or sustained,S1 and S2 within normal limits, no S3, no S4, no clicks, no rubs, no murmurs ABD:  Flat, positive bowel sounds normal in frequency in pitch, no bruits, no rebound, no guarding, no midline pulsatile mass, no hepatomegaly, no splenomegaly EXT:  2 plus pulses throughout, no edema, no cyanosis no clubbing SKIN:  No rashes no nodules NEURO:  Cranial nerves II through XII grossly intact, motor grossly intact throughout PSYCH:  Cognitively intact, oriented to person place and time    EKG:  EKG is ordered today. The ekg ordered today demonstrates sinus bradycardia.  Rate 59 bpm.  Inferolateral TWI, concerning for inferolateral ischemia.  Unchanged from 03/09/15.  Lexiscan Myoview 02/24/16:  Nuclear stress EF: 47% by  computer calculations. Visually , the EF is normal and is around 55-60%.  There was no ST segment deviation noted during stress.  The study is normal.  This is a low risk study.   Echo 01/07/16:  Study Conclusions  - Left ventricle: The cavity size was normal. Janek thickness was   increased in a pattern of mild LVH. Systolic function was normal.   The estimated ejection fraction was in the range of 55% to 60%.   Features are consistent with a pseudonormal left ventricular   filling pattern, with concomitant abnormal relaxation and   increased filling pressure (grade 2 diastolic dysfunction).   Doppler parameters are consistent with high ventricular filling   pressure. -  Aortic valve: There was mild regurgitation. - Mitral valve: Calcified annulus. Mildly thickened leaflets .  Recent Labs: 12/10/2015: ALT 19; Platelets 272 01/05/2016: BUN 13; Creat 0.88; Potassium 4.0; Sodium 141 03/11/2016: Hemoglobin 11.7; TSH 0.86    Lipid Panel    Component Value Date/Time   CHOL 159 10/27/2015 0954   TRIG 37 10/27/2015 0954   HDL 47 10/27/2015 0954   CHOLHDL 3.4 10/27/2015 0954   VLDL 7 10/27/2015 0954   LDLCALC 105 10/27/2015 0954      Wt Readings from Last 3 Encounters:  03/11/16 190 lb (86.2 kg)  02/23/16 188 lb (85.3 kg)  02/11/16 188 lb 6.4 oz (85.5 kg)      ASSESSMENT AND PLAN:  # Atypical chest pain: Ms. Balka has atypical chest pain.  However, she does not exert herself enough to know if she has exertional symptoms.  We will obtain an exercise Myoview to evaluate for ischemia.   # Hypertension: Blood pressure is poorly-controlled.  It was 154/80 on repeat. We will stop metoprolol and switch to carvedilol 12.5 mg twice a day.   Continue amlodipine and spironolactone.   # Chronic diastolic heart failure: Ms. Sharp appears euvolemic on exam.  BP control as above.  Continue spironolactone for volume management.  We discussed the importance of limiting her salt and fluid  intake.  # Palpitations: This only occurs in the setting of her chest pain. We will start with stress testing as above. Thyroid function, electrolytes, and CBC were within normal limits recently. If no arrhythmias are noted on her stress test we will consider a Holter in the future.   # Snoring, apnea: Sleep study  Current medicines are reviewed at length with the patient today.  The patient does not have concerns regarding medicines.  The following changes have been made:  Switch metoprolol to carvedilol   Labs/ tests ordered today include:   No orders of the defined types were placed in this encounter.    Disposition:   FU with Gleen Ripberger C. Oval Linsey, MD, [facility] in 1 month    This note was written with the assistance of speech recognition software.  Please excuse any transcriptional errors.  Signed, Vernee Baines C. Oval Linsey, MD, [facility]  03/25/2016 7:57 AM    [facility]

## 2016-03-30 ENCOUNTER — Ambulatory Visit (HOSPITAL_BASED_OUTPATIENT_CLINIC_OR_DEPARTMENT_OTHER): Payer: BLUE CROSS/BLUE SHIELD | Attending: Cardiovascular Disease | Admitting: Cardiovascular Disease

## 2016-03-30 DIAGNOSIS — R0681 Apnea, not elsewhere classified: Secondary | ICD-10-CM

## 2016-03-30 DIAGNOSIS — R0683 Snoring: Secondary | ICD-10-CM | POA: Diagnosis not present

## 2016-03-30 DIAGNOSIS — G4733 Obstructive sleep apnea (adult) (pediatric): Secondary | ICD-10-CM | POA: Insufficient documentation

## 2016-03-30 DIAGNOSIS — Z79899 Other long term (current) drug therapy: Secondary | ICD-10-CM | POA: Diagnosis not present

## 2016-03-30 DIAGNOSIS — G478 Other sleep disorders: Secondary | ICD-10-CM | POA: Diagnosis not present

## 2016-03-31 ENCOUNTER — Other Ambulatory Visit: Payer: Self-pay | Admitting: Family Medicine

## 2016-04-01 NOTE — Procedures (Signed)
Patient Name: Teresa [name], Seeds Study Date: 03/30/2016 Gender: Female D.O.B: 1961/06/18 Age (years): 55 Referring Provider: Skeet Latch Height (inches): 65 Interpreting Physician: Shelva Majestic MD, ABSM Weight (lbs): 185 RPSGT: Gerhard Perches BMI: 31 MRN: [id number] Neck Size: 15.50  CLINICAL INFORMATION Sleep Study Type: NPSG Indication for sleep study: OSA, Snoring Epworth Sleepiness Score: 5  SLEEP STUDY TECHNIQUE As per the AASM Manual for the Scoring of Sleep and Associated Events v2.3 (April 2016) with a hypopnea requiring 4% desaturations. The channels recorded and monitored were frontal, central and occipital EEG, electrooculogram (EOG), submentalis EMG (chin), nasal and oral airflow, thoracic and abdominal Hoshino motion, anterior tibialis EMG, snore microphone, electrocardiogram, and pulse oximetry.  MEDICATIONS amLODipine (NORVASC) 10 MG tablet BIOTIN PO carvedilol (COREG) 12.5 MG tablet Cyanocobalamin (B-12 PO) cycloSPORINE (RESTASIS) 0.05 % ophthalmic emulsion Hypromellose (ARTIFICIAL TEARS OP) ibuprofen (ADVIL,MOTRIN) 600 MG tablet levothyroxine (SYNTHROID, LEVOTHROID) 112 MCG tablet methocarbamol (ROBAXIN) 500 MG tablet mometasone (NASONEX) 50 MCG/ACT nasal spray ofloxacin (FLOXIN) 0.3 % otic solution Olopatadine HCl 0.6 % SOLN spironolactone (ALDACTONE) 25 MG tablet  Medications self-administered by patient during sleep study : No sleep medicine administered.  SLEEP ARCHITECTURE The study was initiated at 10:01:26 PM and ended at 4:44:37 AM. Sleep onset time was 40.2 minutes and the sleep efficiency was 85.8%. The total sleep time was 346.0 minutes. Wake after sleep onset (WASO) was 17 minutes Stage REM latency was 115.5 minutes. The patient spent 1.73% of the night in stage N1 sleep, 70.23% in stage N2 sleep, 0.00% in stage N3 and 28.04% in REM. Alpha intrusion was absent. Supine sleep was 6.64%.  RESPIRATORY PARAMETERS The overall  apnea/hypopnea index (AHI) was 1.7 per hour. There were 7 total apneas, including 5 obstructive, 2 central and 0 mixed apneas. There were 3 hypopneas and 24 RERAs. The AHI during Stage REM sleep was 4.9 per hour. AHI while supine was 0.0 per hour. The mean oxygen saturation was 95.91%. The minimum SpO2 during sleep was 92.00%. Moderate snoring was noted during this study.  CARDIAC DATA The 2 lead EKG demonstrated sinus rhythm. The mean heart rate was 60.33 beats per minute. Other EKG findings include: None.  LEG MOVEMENT DATA The total PLMS were 0 with a resulting PLMS index of 0.00. Associated arousal with leg movement index was 0.0.  IMPRESSIONS - No significant obstructive sleep apnea occurred during this study (AHI = 1.7/h). RDI was 5.9/h, and AHI during REM sleep was 4.9/h. - No significant central sleep apnea occurred during this study (CAI = 0.3/h). - No oxygen desaturation during the study (Min O2 = 92.00%) - Moderate snoring volume. - No cardiac abnormalities were noted during this study. - Clinically significant periodic limb movements did not occur during sleep. No significant associated arousals.  DIAGNOSIS - Increased Upper Airway Resistance Syndrome - Snoring  RECOMMENDATIONS - At present there is no indication for CPAP therapy. - Efforts should be made to optimize nasal and oropharyngeal patency; consider alternatives for the treatment of snoring. - Avoid alcohol, sedatives and other CNS depressants that may worsen sleep apnea and disrupt normal sleep architecture. - Sleep hygiene should be reviewed to assess factors that may improve sleep quality. - Weight management and regular exercise should be initiated or continued if appropriate.   [Electronically signed] 04/01/2016 05:27 PM  Shelva Majestic MD, [facility], [place], American Board of Sleep Medicine   NPI: [id number] [place] PH: [phone]   FX: [phone] [place]  ACADEMY OF SLEEP MEDICINE

## 2016-04-07 ENCOUNTER — Ambulatory Visit (INDEPENDENT_AMBULATORY_CARE_PROVIDER_SITE_OTHER): Payer: BLUE CROSS/BLUE SHIELD | Admitting: Cardiovascular Disease

## 2016-04-07 VITALS — BP 133/90 | HR 79 | Ht 65.0 in | Wt 189.0 lb

## 2016-04-07 DIAGNOSIS — R002 Palpitations: Secondary | ICD-10-CM

## 2016-04-07 DIAGNOSIS — R0789 Other chest pain: Secondary | ICD-10-CM

## 2016-04-07 DIAGNOSIS — E7800 Pure hypercholesterolemia, unspecified: Secondary | ICD-10-CM

## 2016-04-07 DIAGNOSIS — I5032 Chronic diastolic (congestive) heart failure: Secondary | ICD-10-CM | POA: Diagnosis not present

## 2016-04-07 DIAGNOSIS — I10 Essential (primary) hypertension: Secondary | ICD-10-CM

## 2016-04-07 NOTE — Progress Notes (Signed)
Cardiology Office Note   Date:  04/07/2016   ID:  Teresa [name], DOB Mar 19, 1961, MRN [id number]  PCP:  [name], MD  Cardiologist:   Skeet Latch, MD   Chief Complaint  Patient presents with  . Follow-up    stress test, pt denied chest pain and swelling in legs and feet     History of Present Illness: Teresa [name] is a 55 y.o. female with hypertension, palpitations, and hyperthyroidism who presents for follow up.  Ms. Diggs saw [name], MD, on 01/05/16.  At that appointment she reported occasional palpitations and burning in her chest.  She had an echo on 7/13 showed LVEF 55-60% with grade 2 diastolic dysfunction and mild MR.  Ms. Deblase reports one year of palpitations and chest pain that occurs sporadically.  She was referred for [facility] 02/24/16 that revealed LVEF 47% and no ischemia.  At her appointment on 01/2016 her blood pressure was elevated so she was switched from metoprolol to carvedilol.  Since her last appointment she has been doing well.  Her dizziness has improved but she continues to have pain in her left shoulder and arm.  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  She notes that immediately after starting carvedilol she continued to feel dizzy. However, this is since subsided. She has no syncope. She is not having any chest pain or pressure in her breathing has been okay. She continues to try to quit smoking. She is currently smoking approximately one third of a pack per day. She is also using a vapor cigarette.   Past Medical History:  Diagnosis Date  . Anemia   . Chronic lower back pain   . GERD (gastroesophageal reflux disease)   . H/O hiatal hernia   . History of amputation of lesser toe of left foot ([place]) 03/11/2015  . Hypertension   . Hyperthyroidism    hx  . Hypothyroidism   . Migraine headache    "a few times/yr" (03/05/2015)  . Neck strain 02/06/2012  . Osteomyelitis ([place])    [name] 03/05/2015  .  Osteomyelitis of toe of left foot ([place]) 03/05/2015  . Palpitations 01/05/2016  . Positive TB test     Past Surgical History:  Procedure Laterality Date  . ABDOMINAL HYSTERECTOMY  2006  . AMPUTATION TOE Left 03/05/2015   Procedure: AMPUTATION LEFT 5TH  TOE;  Surgeon: Wylene Simmer, MD;  Location: [place];  Service: Orthopedics;  Laterality: Left;  . APPENDECTOMY  2006  . [place]  . TOE AMPUTATION Left 03/05/2015   5th toe  . TUBAL LIGATION Bilateral 1990     Current Outpatient Prescriptions  Medication Sig Dispense Refill  . amLODipine (NORVASC) 10 MG tablet Take 1 tablet (10 mg total) by mouth at bedtime. 90 tablet 1  . BIOTIN PO Take 1 tablet by mouth daily.     . carvedilol (COREG) 12.5 MG tablet Take 1 tablet (12.5 mg total) by mouth 2 (two) times daily. 60 tablet 5  . Cyanocobalamin (B-12 PO) Take 1 tablet by mouth daily.     . cycloSPORINE (RESTASIS) 0.05 % ophthalmic emulsion Place 1 drop into both eyes 2 (two) times daily.     . Hypromellose (ARTIFICIAL TEARS OP) Place 1 drop into both eyes as needed (for dry eyes). Reported on 08/13/2015    . ibuprofen (ADVIL,MOTRIN) 600 MG tablet Take 1 tablet (600 mg total) by mouth  3 (three) times daily after meals. (Patient taking differently: Take 600 mg by mouth every 8 (eight) hours as needed. ) 30 tablet 0  . levothyroxine (SYNTHROID, LEVOTHROID) 112 MCG tablet TAKE ONE TABLET BY MOUTH ONCE DAILY BEFORE BREAKFAST 90 tablet 1  . methocarbamol (ROBAXIN) 500 MG tablet Take 2 tablets (1,000 mg total) by mouth every 8 (eight) hours as needed for muscle spasms. 30 tablet 0  . mometasone (NASONEX) 50 MCG/ACT nasal spray Place 2 sprays into the nose daily. 17 g 12  . ofloxacin (FLOXIN) 0.3 % otic solution Place 10 drops into the left ear daily. For 7 days. 5 mL 0  . Olopatadine HCl 0.6 % SOLN Place 2 drops into the nose 2 (two) times daily. 1 Bottle 2  . spironolactone (ALDACTONE) 25 MG tablet Take 1 tablet (25 mg total) by mouth  daily. 90 tablet 1   No current facility-administered medications for this visit.     Allergies:   Clonidine derivatives; Aspirin; and Latex    Social History:  The patient  reports that she has been smoking Cigarettes.  She has been smoking about 0.00 packs per day for the past 36.00 years. She has never used smokeless tobacco. She reports that she does not drink alcohol or use drugs.   Family History:  The patient's family history includes Cancer (age of onset: 85) in her father; Diabetes in her brother, mother, other, and sister; Heart disease in her mother and other; Kidney disease in her brother and sister; Stroke in her father.    ROS:  Please see the history of present illness.   Otherwise, review of systems are positive for L sided pain and numbness.   All other systems are reviewed and negative.    PHYSICAL EXAM: VS:  BP 133/90   Pulse 79   Ht 5\' 5"  (1.651 m)   Wt 189 lb (85.7 kg)   BMI 31.45 kg/m  , BMI Body mass index is 31.45 kg/m. GENERAL:  Well appearing HEENT:  Pupils equal round and reactive, fundi not visualized, oral mucosa unremarkable NECK:  No jugular venous distention, waveform within normal limits, carotid upstroke brisk and symmetric, no bruits LYMPHATICS:  No cervical adenopathy LUNGS:  Clear to auscultation bilaterally HEART:  RRR.  PMI not displaced or sustained,S1 and S2 within normal limits, no S3, no S4, no clicks, no rubs, no murmurs ABD:  Flat, positive bowel sounds normal in frequency in pitch, no bruits, no rebound, no guarding, no midline pulsatile mass, no hepatomegaly, no splenomegaly EXT:  2 plus pulses throughout, no edema, no cyanosis no clubbing SKIN:  No rashes no nodules NEURO:  Cranial nerves II through XII grossly intact, motor grossly intact throughout PSYCH:  Cognitively intact, oriented to person place and time   EKG:  EKG is not ordered today. The ekg ordered 02/11/16 demonstrates sinus bradycardia.  Rate 59 bpm.  Inferolateral  TWI, concerning for inferolateral ischemia.  Unchanged from 03/09/15.  Lexiscan Myoview 02/24/16:  Nuclear stress EF: 47% by computer calculations. Visually , the EF is normal and is around 55-60%.  There was no ST segment deviation noted during stress.  The study is normal.  This is a low risk study.   Echo 01/07/16:  Study Conclusions  - Left ventricle: The cavity size was normal. Maroney thickness was   increased in a pattern of mild LVH. Systolic function was normal.   The estimated ejection fraction was in the range of 55% to 60%.   Features  are consistent with a pseudonormal left ventricular   filling pattern, with concomitant abnormal relaxation and   increased filling pressure (grade 2 diastolic dysfunction).   Doppler parameters are consistent with high ventricular filling   pressure. - Aortic valve: There was mild regurgitation. - Mitral valve: Calcified annulus. Mildly thickened leaflets .  Recent Labs: 12/10/2015: ALT 19; Platelets 272 01/05/2016: BUN 13; Creat 0.88; Potassium 4.0; Sodium 141 03/11/2016: Hemoglobin 11.7; TSH 0.86    Lipid Panel    Component Value Date/Time   CHOL 159 10/27/2015 0954   TRIG 37 10/27/2015 0954   HDL 47 10/27/2015 0954   CHOLHDL 3.4 10/27/2015 0954   VLDL 7 10/27/2015 0954   LDLCALC 105 10/27/2015 0954      Wt Readings from Last 3 Encounters:  04/07/16 189 lb (85.7 kg)  03/30/16 185 lb (83.9 kg)  03/11/16 190 lb (86.2 kg)      ASSESSMENT AND PLAN:  # Atypical chest pain: She continues to have shoulder pain. However, her Lexiscan Myoview was negative. I do not think that this is due to ischemia suggested that she see a neurologist, as her x-rays have been unremarkable  # Hypertension: Blood pressure is well-controlled.  Continue carvedilol, amlodipine and spironolactone.   # Chronic diastolic heart failure: Ms. Leos appears euvolemic on exam.  BP control as above.    # Palpitations: Improved.  # Snoring, apnea: Sleep study  results pending.    Current medicines are reviewed at length with the patient today.  The patient does not have concerns regarding medicines.  The following changes have been made:  none  Labs/ tests ordered today include:   No orders of the defined types were placed in this encounter.    Disposition:   FU with Ram Haugan C. Oval Linsey, MD, [facility] in 1 year   This note was written with the assistance of speech recognition software.  Please excuse any transcriptional errors.  Signed, Rakel Junio C. Oval Linsey, MD, [facility]  04/07/2016 9:47 AM    [facility]

## 2016-04-07 NOTE — Patient Instructions (Addendum)
Medication Instructions:  Your physician recommends that you continue on your current medications as directed. Please refer to the Current Medication list given to you today.  Labwork: NONE  Testing/Procedures: NONE  Follow-Up: 1 YEAR OV

## 2016-04-12 ENCOUNTER — Telehealth: Payer: Self-pay | Admitting: Family Medicine

## 2016-04-12 ENCOUNTER — Telehealth: Payer: Self-pay | Admitting: *Deleted

## 2016-04-12 DIAGNOSIS — G8929 Other chronic pain: Secondary | ICD-10-CM

## 2016-04-12 DIAGNOSIS — M67912 Unspecified disorder of synovium and tendon, left shoulder: Secondary | ICD-10-CM

## 2016-04-12 DIAGNOSIS — M25512 Pain in left shoulder: Secondary | ICD-10-CM

## 2016-04-12 NOTE — Telephone Encounter (Signed)
Called and notified patient of sleep study results.

## 2016-04-12 NOTE — Telephone Encounter (Signed)
Note reviewed. I noticed she was prescribed another ear drop A/B ( Ofloxacin) after I had prescribed (Ciprodex). I called to ensure she is only taking one of the two. She stated she is only using one. I called her pharmacy and they informed me that the Ciprodex was deactivated and that patient picked up Ofloxacin.   She was recently seen by Cardiologist who suggested neurology referral given shoulder pain. I called patient, she is still having aching pain in her left shoulder. Denies numbness, no pins and needle sensation. I again discussed her MRI report with her showing rotator cuff tendinitis. She was referred to PT with no improvement. As discussed with her I will like to refer to orthopedic for this and if she is interested in neurology referral I can do that as well. She opted for orthopedic referral first.  I recommended intraarticular steroid injection to be scheduled on Gyn clinic. This was discussed with Dr. Nori Riis as well. I called her back and made her an appointment at the Gyn/procedure clinic.  Referral to orthopedic placed.

## 2016-04-15 ENCOUNTER — Encounter (HOSPITAL_COMMUNITY): Payer: Self-pay | Admitting: Emergency Medicine

## 2016-04-15 ENCOUNTER — Ambulatory Visit (HOSPITAL_COMMUNITY)
Admission: EM | Admit: 2016-04-15 | Discharge: 2016-04-15 | Disposition: A | Discharge status: Home / Self Care | Payer: BLUE CROSS/BLUE SHIELD | Attending: Family Medicine | Admitting: Family Medicine

## 2016-04-15 DIAGNOSIS — L03012 Cellulitis of left finger: Secondary | ICD-10-CM

## 2016-04-15 MED ORDER — CLINDAMYCIN HCL 150 MG PO CAPS: 150.0000 mg | ORAL_CAPSULE | Freq: Four times a day (QID) | ORAL | 0 refills | Status: DC

## 2016-04-15 NOTE — ED Triage Notes (Signed)
The patient presented to the [facility] with a complaint of a swollen and painful left thumb that started yesterday. The patient reported that she had a manicure 4 days ago.

## 2016-04-15 NOTE — ED Provider Notes (Signed)
CSN: [id number]     Arrival date & time 04/15/16  1210 History   First MD Initiated Contact with Patient 04/15/16 1243     Chief Complaint  Patient presents with  . Kerin Perna   (Consider location/radiation/quality/duration/timing/severity/associated sxs/prior Treatment) HPI  Patient is a 55 y.o. Female who presents to UC c/o paronychia of left thumb which formed after getting her nails done 3 days ago.  She also has a mass on lower volar aspect of left thumb which formed several months ago and comes and goes.  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  Denies any fever or chills.   Past Medical History:  Diagnosis Date  . Anemia   . Chronic lower back pain   . GERD (gastroesophageal reflux disease)   . H/O hiatal hernia   . History of amputation of lesser toe of left foot ([place]) 03/11/2015  . Hypertension   . Hyperthyroidism    hx  . Hypothyroidism   . Migraine headache    "a few times/yr" (03/05/2015)  . Neck strain 02/06/2012  . Osteomyelitis ([place])    [name] 03/05/2015  . Osteomyelitis of toe of left foot ([place]) 03/05/2015  . Palpitations 01/05/2016  . Positive TB test    Past Surgical History:  Procedure Laterality Date  . ABDOMINAL HYSTERECTOMY  2006  . AMPUTATION TOE Left 03/05/2015   Procedure: AMPUTATION LEFT 5TH  TOE;  Surgeon: Wylene Simmer, MD;  Location: [place];  Service: Orthopedics;  Laterality: Left;  . APPENDECTOMY  2006  . [place]  . TOE AMPUTATION Left 03/05/2015   5th toe  . TUBAL LIGATION Bilateral 1990   Family History  Problem Relation Age of Onset  . Heart disease Mother     CAB age 34  . Diabetes Mother   . Cancer Father 65    colon cancer  . Stroke Father   . Diabetes Sister   . Kidney disease Sister     renal failure  . Diabetes Brother   . Kidney disease Brother     renal failure  . Heart disease Other   . Diabetes Other    Social History  Substance Use Topics  . Smoking status:  Current Every Day Smoker    Packs/day: 0.00    Years: 36.00    Types: Cigarettes  . Smokeless tobacco: Never Used  . Alcohol use No   OB History    No data available     Review of Systems  Denies: HEADACHE, NAUSEA, ABDOMINAL PAIN, CHEST PAIN, CONGESTION, DYSURIA, SHORTNESS OF BREATH  Allergies  Bactrim [sulfamethoxazole-trimethoprim]; Clonidine derivatives; Aspirin; and Latex  Home Medications   Prior to Admission medications   Medication Sig Start Date End Date Taking? Authorizing Provider  amLODipine (NORVASC) 10 MG tablet Take 1 tablet (10 mg total) by mouth at bedtime. 10/27/15   Kinnie Feil, MD  BIOTIN PO Take 1 tablet by mouth daily.     Historical Provider, MD  carvedilol (COREG) 12.5 MG tablet Take 1 tablet (12.5 mg total) by mouth 2 (two) times daily. 02/11/16 05/11/16  Skeet Latch, MD  Cyanocobalamin (B-12 PO) Take 1 tablet by mouth daily.     Historical Provider, MD  cycloSPORINE (RESTASIS) 0.05 % ophthalmic emulsion Place 1 drop into both eyes 2 (two) times daily.     Historical Provider, MD  Hypromellose (ARTIFICIAL TEARS OP) Place 1 drop into both eyes as needed (for dry eyes). Reported  on 08/13/2015    Historical Provider, MD  ibuprofen (ADVIL,MOTRIN) 600 MG tablet Take 1 tablet (600 mg total) by mouth 3 (three) times daily after meals. Patient taking differently: Take 600 mg by mouth every 8 (eight) hours as needed.  12/10/15   Julianne Rice, MD  levothyroxine (SYNTHROID, LEVOTHROID) 112 MCG tablet TAKE ONE TABLET BY MOUTH ONCE DAILY BEFORE BREAKFAST 03/31/16   Kinnie Feil, MD  methocarbamol (ROBAXIN) 500 MG tablet Take 2 tablets (1,000 mg total) by mouth every 8 (eight) hours as needed for muscle spasms. 12/10/15   Julianne Rice, MD  mometasone (NASONEX) 50 MCG/ACT nasal spray Place 2 sprays into the nose daily. 12/10/15   Julianne Rice, MD  ofloxacin (FLOXIN) 0.3 % otic solution Place 10 drops into the left ear daily. For 7 days. 03/15/16   Lupita Dawn,  MD  Olopatadine HCl 0.6 % SOLN Place 2 drops into the nose 2 (two) times daily. 03/11/16   Kinnie Feil, MD  spironolactone (ALDACTONE) 25 MG tablet Take 1 tablet (25 mg total) by mouth daily. 01/05/16   Kinnie Feil, MD   Meds Ordered and Administered this Visit  Medications - No data to display  BP 155/95 (BP Location: Right Arm)   Pulse 69   Temp 98.6 F (37 C) (Oral)   Resp 14   SpO2 100%  No data found.   Physical Exam NURSES NOTES AND VITAL SIGNS REVIEWED. CONSTITUTIONAL: Well developed, well nourished, no acute distress HEENT: normocephalic, atraumatic EYES: Conjunctiva normal NECK:normal ROM, supple, no adenopathy PULMONARY:No respiratory distress, normal effort ABDOMINAL: Soft, ND, NT BS+, No CVAT MUSCULOSKELETAL: Normal ROM of all extremities, infected left thumb SKIN: warm and dry without rash PSYCHIATRIC: Mood and affect, behavior are normal  Urgent Care Course   Clinical Course    .Marland KitchenIncision and Drainage Date/Time: 04/15/2016 7:34 PM Performed by: Konrad Felix Authorized by: Ihor Gully D   Consent:    Consent obtained:  Verbal   Consent given by:  Patient Location:    Type:  Abscess   Location:  Upper extremity   Upper extremity location:  Finger   Finger location:  L thumb Pre-procedure details:    Skin preparation:  Betadine Anesthesia (see MAR for exact dosages):    Anesthesia method:  Local infiltration (cryospray) Procedure type:    Complexity:  Simple Procedure details:    Incision types:  Single straight   Scalpel blade:  11   Drainage:  Purulent and bloody   Drainage amount:  Scant   Wound treatment:  Wound left open Post-procedure details:    Patient tolerance of procedure:  Tolerated well, no immediate complications   (including critical care time)  Labs Review Labs Reviewed - No data to display  Imaging Review No results found.   Visual Acuity Review  Right Eye Distance:   Left Eye Distance:   Bilateral  Distance:    Right Eye Near:   Left Eye Near:    Bilateral Near:         MDM   1. Paronychia of finger of left hand     Patient is reassured that there are no issues that require transfer to higher level of care at this time or additional tests. Patient is advised to continue home symptomatic treatment. Patient is advised that if there are new or worsening symptoms to attend the emergency department, contact primary care provider, or return to UC. Instructions of care provided discharged home in stable condition.  THIS NOTE WAS GENERATED USING A VOICE RECOGNITION SOFTWARE PROGRAM. ALL REASONABLE EFFORTS  WERE MADE TO PROOFREAD THIS DOCUMENT FOR ACCURACY.  I have verbally reviewed the discharge instructions with the patient. A printed AVS was given to the patient.  All questions were answered prior to discharge.      Konrad Felix, [place] 04/15/16 Joen Laura

## 2016-04-22 ENCOUNTER — Encounter (INDEPENDENT_AMBULATORY_CARE_PROVIDER_SITE_OTHER): Payer: Self-pay | Admitting: Sports Medicine

## 2016-04-22 ENCOUNTER — Ambulatory Visit (INDEPENDENT_AMBULATORY_CARE_PROVIDER_SITE_OTHER): Payer: BLUE CROSS/BLUE SHIELD | Admitting: Sports Medicine

## 2016-04-22 VITALS — BP 144/85 | HR 71 | Ht 65.0 in

## 2016-04-22 DIAGNOSIS — M7542 Impingement syndrome of left shoulder: Secondary | ICD-10-CM

## 2016-04-22 DIAGNOSIS — M7582 Other shoulder lesions, left shoulder: Secondary | ICD-10-CM

## 2016-04-22 NOTE — Progress Notes (Signed)
Teresa [name] - 55 y.o. female MRN [id number]  Date of birth: 02-16-1961  Office Visit Note: Visit Date: 04/22/2016 PCP: [name], MD Referred by: Kinnie Feil, MD  Subjective: Chief Complaint  Patient presents with  . Left Shoulder - Pain   HPI: Patient states left shoulder pain for 6 months.  Left shoulder hurts after using arm all day. Neck pain on left side.  Has had x-rays, MRI's, CT Scan, and Ultrasounds done.  Currently taking Ibuprofen and muscle relaxer.  Numbness and tingling.  Has had a sleep study done.  Pain has been present for several months. Worsened with any type of overhead activity.    ROS Otherwise per HPI.  Assessment & Plan: Visit Diagnoses:  1. Rotator cuff tendonitis, left   2. Impingement syndrome of left shoulder     Plan:   AAOS shoulder conditioning program provided as well as injection into the subacromial space.                Meds & Orders: No orders of the defined types were placed in this encounter.   Orders Placed This Encounter  Procedures  . Large Joint Injection/Arthrocentesis    Follow-up: Return in about 6 weeks (around 06/03/2016).   Procedures: Left Shoulder Subacromial Injection Date/Time: 04/22/2016 10:28 AM Performed by: Teresa Coombs D Authorized by: Teresa Coombs D   Consent Given by:  Patient Timeout: prior to procedure the correct patient, procedure, and site was verified   Location:  Shoulder Site:  L subacromial bursa Prep: patient was prepped and draped in usual sterile fashion   Needle Size:  22 G Needle Length:  1.5 inches Approach:  Posterior Ultrasound Guidance: No   Fluoroscopic Guidance: No   Arthrogram: No Medications:  3 mL lidocaine 1 %; 40 mg methylPREDNISolone acetate 40 MG/ML Aspiration Attempted: No      No notes on file   Clinical History: No additional findings.  She reports that she has been smoking Cigarettes.  She has been smoking about 0.00 packs per day for the past 36.00  years. She has never used smokeless tobacco.   Recent Labs  10/27/15 0941 03/11/16 0920  HGBA1C 6.2 6.1    Objective:  VS:  HT:5\' 5"  (165.1 cm)   WT:   BMI:     BP:(!) 144/85  HR:71bpm  TEMP: ( )  RESP:  Physical Exam  Ortho Exam Imaging: No results found.  Past Medical/Family/Surgical/Social History: Patient Active Problem List   Diagnosis Date Noted  . Dizziness 03/11/2016  . Otalgia 03/11/2016  . Cervical disc disorder with radiculopathy of cervical region 02/10/2016  . Prediabetes 10/29/2015  . Tendinopathy of left rotator cuff 10/27/2015  . Essential hypertension   . Obesity 04/15/2014  . Neck pain 02/11/2014  . Left knee pain 08/06/2013  . Allergic rhinitis 07/19/2013  . Thyroid nodule 02/12/2013  . GERD (gastroesophageal reflux disease) 02/06/2012  . Tobacco abuse 09/21/2011  . Hypothyroidism 09/21/2011  . Throat pain 09/21/2011   Past Medical History:  Diagnosis Date  . Anemia   . Chronic lower back pain   . GERD (gastroesophageal reflux disease)   . H/O hiatal hernia   . History of amputation of lesser toe of left foot ([place]) 03/11/2015  . Hypertension   . Hyperthyroidism    hx  . Hypothyroidism   . Migraine headache    "a few times/yr" (03/05/2015)  . Neck strain 02/06/2012  . Osteomyelitis ([place])    [name] 03/05/2015  . Osteomyelitis  of toe of left foot ([place]) 03/05/2015  . Palpitations 01/05/2016  . Positive TB test    Family History  Problem Relation Age of Onset  . Heart disease Mother     CAB age 41  . Diabetes Mother   . Cancer Father 77    colon cancer  . Stroke Father   . Diabetes Sister   . Kidney disease Sister     renal failure  . Diabetes Brother   . Kidney disease Brother     renal failure  . Heart disease Other   . Diabetes Other    Past Surgical History:  Procedure Laterality Date  . ABDOMINAL HYSTERECTOMY  2006  . AMPUTATION TOE Left 03/05/2015   Procedure: AMPUTATION LEFT 5TH  TOE;  Surgeon: Wylene Simmer, MD;  Location: [place];  Service: Orthopedics;  Laterality: Left;  . APPENDECTOMY  2006  . [place]  . TOE AMPUTATION Left 03/05/2015   5th toe  . TUBAL LIGATION Bilateral 1990   Social History   Occupational History  . Not on file.   Social History Main Topics  . Smoking status: Current Every Day Smoker    Packs/day: 0.00    Years: 36.00    Types: Cigarettes  . Smokeless tobacco: Never Used  . Alcohol use No  . Drug use: No  . Sexual activity: Not on file

## 2016-04-28 ENCOUNTER — Ambulatory Visit: Payer: BLUE CROSS/BLUE SHIELD

## 2016-04-28 ENCOUNTER — Ambulatory Visit (INDEPENDENT_AMBULATORY_CARE_PROVIDER_SITE_OTHER): Payer: BLUE CROSS/BLUE SHIELD | Admitting: *Deleted

## 2016-04-28 ENCOUNTER — Encounter: Payer: Self-pay | Admitting: Family Medicine

## 2016-04-28 ENCOUNTER — Other Ambulatory Visit: Payer: Self-pay | Admitting: Family Medicine

## 2016-04-28 DIAGNOSIS — Z23 Encounter for immunization: Secondary | ICD-10-CM | POA: Diagnosis not present

## 2016-04-28 MED ORDER — LEVOTHYROXINE SODIUM 112 MCG PO TABS: ORAL_TABLET | ORAL | 1 refills | Status: DC

## 2016-04-28 NOTE — Progress Notes (Signed)
Patient has a prescheduled appointment for shoulder injection. Upon reviewing her medication, she got injection just 2 weeks ago at the orthopedic's office. She is advised to cancel appointment and see the nurse instead for immunization update. She however requested refill of her Synthroid which I gave. Also showed me her left thumb paronychia which is healing fine, no signs of infection. She pointed to a bump on her thumb which has been there for years but felt it increased in size since she had paronychia. Patient reassured this is ganglion cyst. She is advised to schedule follow up for reassessment in 1-2 weeks or sooner if symptoms worsens. Or discuss ganglion cyst with her orthopedic, although she will likely need hand surgeon referral if surgical intervention is warranted. She agreed with plan.

## 2016-04-29 MED ORDER — LIDOCAINE HCL 1 % IJ SOLN
3.0000 mL | INTRAMUSCULAR | Status: AC | PRN
  Administered 2016-04-22: 3 mL

## 2016-04-29 MED ORDER — METHYLPREDNISOLONE ACETATE 40 MG/ML IJ SUSP
40.0000 mg | INTRAMUSCULAR | Status: AC | PRN
  Administered 2016-04-22: 40 mg via INTRA_ARTICULAR

## 2016-05-11 ENCOUNTER — Encounter: Payer: Self-pay | Admitting: Family Medicine

## 2016-05-13 MED ORDER — LEVOTHYROXINE SODIUM 112 MCG PO TABS: ORAL_TABLET | ORAL | 1 refills | Status: DC

## 2016-05-13 NOTE — Addendum Note (Signed)
Addended by: Valerie Roys on: 05/13/2016 09:56 AM   Modules accepted: Orders

## 2016-05-13 NOTE — Telephone Encounter (Signed)
Medication sent to a different pharmacy per patient request. Teresa [name]

## 2016-06-03 ENCOUNTER — Ambulatory Visit (INDEPENDENT_AMBULATORY_CARE_PROVIDER_SITE_OTHER): Payer: BLUE CROSS/BLUE SHIELD | Admitting: Sports Medicine

## 2016-06-06 ENCOUNTER — Ambulatory Visit (INDEPENDENT_AMBULATORY_CARE_PROVIDER_SITE_OTHER): Payer: BLUE CROSS/BLUE SHIELD | Admitting: Sports Medicine

## 2016-06-14 ENCOUNTER — Encounter: Payer: Self-pay | Admitting: Student

## 2016-06-14 ENCOUNTER — Ambulatory Visit (INDEPENDENT_AMBULATORY_CARE_PROVIDER_SITE_OTHER): Payer: BLUE CROSS/BLUE SHIELD | Admitting: Student

## 2016-06-14 VITALS — BP 118/70 | HR 73 | Temp 99.1°F | Wt 192.0 lb

## 2016-06-14 DIAGNOSIS — R0789 Other chest pain: Secondary | ICD-10-CM | POA: Diagnosis not present

## 2016-06-14 DIAGNOSIS — E038 Other specified hypothyroidism: Secondary | ICD-10-CM

## 2016-06-14 LAB — TSH: TSH: 1.83 mIU/L

## 2016-06-14 NOTE — Assessment & Plan Note (Signed)
No current chest pain. EKG done with EMS unchanged from previous EKG in our system. Chest pain resolved with talking, no medication given making ACS less likely. She does have a significant history of smoking and hypertension but her blood pressure remains well controlled.  - given stressful home situation and resolution of pain with talking and no medications needed it is more likley that her pain was caused by stress. Pharmacotherapy was discussed which she refused. Will continue to monitor. - continue to discuss [facility] at next visit - ED precautions reviewed

## 2016-06-14 NOTE — Patient Instructions (Addendum)
Follow up with your PCP If you have questions or concerns call the office   If you have chest pain or Shortness of breath, go to the ED

## 2016-06-14 NOTE — Progress Notes (Signed)
   Subjective:    Patient ID: Teresa [name], female    DOB: 23-Aug-1960, 55 y.o.   MRN: [id number]   CC: Chest pain- now resolved  HPI: 55 y/o F with PMH of hypothyroidism presents for chest pain  Chest pain - started today at app[roximately 5 AM when going to the rest room - she had sudden left sided chest pain and abdominal pain, urinated on herself  - she has had episodes like this in past which she attributes to stress, but her chest pain was less severe during those times - she called EMS who did an EKG ( which she brought a copy of)  - her chest pain resolved after EMS helped her to calm down, she was not given any medications - reports that her BP at that time as 140/73 - she does report increased stress with her daughter living with her and depending on her for resources ( car)  Hypothyroidism - reports frustation over her Synthroid dose and feels that her hypothyroidism is better treated with her own mixture of lemon and vinegar which she takes twice a day  Smoking status reviewed Smokes 1 ppd  Review of Systems  Per HPI, else denies recent illness, fever,  weakness   Objective:  BP 118/70   Pulse 73   Temp 99.1 F (37.3 C) (Oral)   Wt 192 lb (87.1 kg)   SpO2 99%   BMI 31.95 kg/m  Vitals and nursing note reviewed  General: NAD, comfortable appearing HEENT: bilateral exopthalmos noted Cardiac: RRR Respiratory: CTAB, normal effort Extremities: no edema or cyanosis. WWP. MSK: diffusely tender over right anterior chest  Skin: warm and dry, no rashes noted Neuro: alert and oriented, no focal deficits   Assessment & Plan:    Hypothyroidism Recheck TSH today per patient request  Other chest pain No current chest pain. EKG done with EMS unchanged from previous EKG in our system. Chest pain resolved with talking, no medication given making ACS less likely. She does have a significant history of smoking and hypertension but her blood pressure remains well  controlled.  - given stressful home situation and resolution of pain with talking and no medications needed it is more likley that her pain was caused by stress. Pharmacotherapy was discussed which she refused. Will continue to monitor. - continue to discuss [facility] at next visit - ED precautions reviewed     Marlis Oldaker A. Lincoln Brigham MD, [facility] Resident PGY-3 Pager [phone]

## 2016-06-14 NOTE — Assessment & Plan Note (Signed)
Recheck TSH today per patient request

## 2016-06-15 ENCOUNTER — Encounter (INDEPENDENT_AMBULATORY_CARE_PROVIDER_SITE_OTHER): Payer: Self-pay | Admitting: Sports Medicine

## 2016-06-15 ENCOUNTER — Ambulatory Visit (INDEPENDENT_AMBULATORY_CARE_PROVIDER_SITE_OTHER): Payer: BLUE CROSS/BLUE SHIELD | Admitting: Sports Medicine

## 2016-06-15 VITALS — BP 173/81 | HR 75 | Ht 65.0 in | Wt 192.0 lb

## 2016-06-15 DIAGNOSIS — M7582 Other shoulder lesions, left shoulder: Secondary | ICD-10-CM

## 2016-06-15 DIAGNOSIS — M7542 Impingement syndrome of left shoulder: Secondary | ICD-10-CM

## 2016-06-17 NOTE — Progress Notes (Signed)
Teresa [name] - 55 y.o. female MRN [id number]  Date of birth: [birth year]  Office Visit Note: Visit Date: 06/15/2016 PCP: [name], MD Referred by: Kinnie Feil, MD  Subjective: Chief Complaint  Patient presents with  . Left Shoulder - Follow-up  . Follow-up                        Patient states shoulder started back hurting again, but not as intense.  States injection did help for a little bit and doesn't want another injection.  States pain is going into the back of her shoulder.                                                                                                                                                                    HPI: Patient reported overall good improvement following subacromial injection after last visit. Symptoms almost completely abated over quickly returned. She has not been performing any therapeutic exercises. She does have some pain that radiates down into the right hand. ROS: Pt denies chest pain, palpitations, shortness of breath/DOE, orthopnea/PND, LE swelling.  Patient denies any facial asymmetry, unilateral weakness, or dysarthria. Otherwise per HPI.   Clinical History: No specialty comments available.  She reports that she has been smoking Cigarettes.  She has been smoking about 0.00 packs per day for the past 36.00 years. She has never used smokeless tobacco.   Recent Labs  10/27/15 0941 03/11/16 0920  HGBA1C 6.2 6.1    Assessment & Plan: Visit Diagnoses:    ICD-9-CM ICD-10-CM   1. Rotator cuff tendonitis, left 726.10 M75.82   2. Impingement syndrome of left shoulder 726.2 M75.42     Plan: Overall the patient would likely benefit from formal physical therapy but she would like to begin with in home exercise program. Therapeutic exercises as well as Theraband were provided today. We'll plan to have her follow-up if any lack of improvement. Given her elevated blood pressure today. We also recommend that she follow-up with her  primary care physician to further discuss medication titration. Follow-up: No Follow-up on file.  Meds: No orders of the defined types were placed in this encounter.  Procedures: No notes on file   Objective:  VS:  HT:5\' 5"  (165.1 cm)   WT:192 lb (87.1 kg)  BMI:32    BP:(!) 173/81  HR:75bpm  TEMP: ( )  RESP:  Physical Exam:  Adult female. Alert and appropriate.  In no acute distress.  Upper extremities are overall well aligned with no significant deformity. No significant swelling.  Distal pulses 2+/4. No significant bruising/ecchymosis or erythema the skin RIGHT shoulder: No significant deformity. She has small amount of weakness with external rotation but strength is intact. Pain with  Hawkins as well as Neer's. Empty can strength is intact with only minimal pain. No pain with speeds test or Yergason's. Imaging: No results found.  Past Medical/Family/Surgical/Social History: Medications & Allergies reviewed per EMR Patient Active Problem List   Diagnosis Date Noted  . Other chest pain 06/14/2016  . Dizziness 03/11/2016  . Otalgia 03/11/2016  . Cervical disc disorder with radiculopathy of cervical region 02/10/2016  . Prediabetes 10/29/2015  . Tendinopathy of left rotator cuff 10/27/2015  . Essential hypertension   . Obesity 04/15/2014  . Neck pain 02/11/2014  . Left knee pain 08/06/2013  . Allergic rhinitis 07/19/2013  . Thyroid nodule 02/12/2013  . GERD (gastroesophageal reflux disease) 02/06/2012  . Tobacco abuse 09/21/2011  . Hypothyroidism 09/21/2011  . Throat pain 09/21/2011   Past Medical History:  Diagnosis Date  . Anemia   . Chronic lower back pain   . GERD (gastroesophageal reflux disease)   . H/O hiatal hernia   . History of amputation of lesser toe of left foot ([place]) 03/11/2015  . Hypertension   . Hyperthyroidism    hx  . Hypothyroidism   . Migraine headache    "a few times/yr" (03/05/2015)  . Neck strain 02/06/2012  . Osteomyelitis ([place])    [name]  03/05/2015  . Osteomyelitis of toe of left foot ([place]) 03/05/2015  . Palpitations 01/05/2016  . Positive TB test    Family History  Problem Relation Age of Onset  . Heart disease Mother     CAB age 58  . Diabetes Mother   . Cancer Father 45    colon cancer  . Stroke Father   . Diabetes Sister   . Kidney disease Sister     renal failure  . Diabetes Brother   . Kidney disease Brother     renal failure  . Heart disease Other   . Diabetes Other    Past Surgical History:  Procedure Laterality Date  . ABDOMINAL HYSTERECTOMY  2006  . AMPUTATION TOE Left 03/05/2015   Procedure: AMPUTATION LEFT 5TH  TOE;  Surgeon: Wylene Simmer, MD;  Location: [place];  Service: Orthopedics;  Laterality: Left;  . APPENDECTOMY  2006  . [place]  . TOE AMPUTATION Left 03/05/2015   5th toe  . TUBAL LIGATION Bilateral 1990   Social History   Occupational History  . Not on file.   Social History Main Topics  . Smoking status: Current Every Day Smoker    Packs/day: 0.00    Years: 36.00    Types: Cigarettes  . Smokeless tobacco: Never Used  . Alcohol use No  . Drug use: No  . Sexual activity: Not on file

## 2016-07-06 ENCOUNTER — Encounter: Payer: Self-pay | Admitting: Family Medicine

## 2016-07-07 ENCOUNTER — Other Ambulatory Visit: Payer: Self-pay | Admitting: Family Medicine

## 2016-07-07 MED ORDER — AMLODIPINE BESYLATE 10 MG PO TABS: 10.0000 mg | ORAL_TABLET | Freq: Every day | ORAL | 2 refills | Status: DC

## 2016-07-08 ENCOUNTER — Other Ambulatory Visit: Payer: Self-pay | Admitting: Family Medicine

## 2016-07-08 MED ORDER — AMLODIPINE BESYLATE 10 MG PO TABS: 10.0000 mg | ORAL_TABLET | Freq: Every day | ORAL | 2 refills | Status: DC

## 2016-07-27 ENCOUNTER — Other Ambulatory Visit: Payer: Self-pay | Admitting: Family Medicine

## 2016-07-27 DIAGNOSIS — I10 Essential (primary) hypertension: Secondary | ICD-10-CM

## 2016-07-28 NOTE — Telephone Encounter (Signed)
Please advise patient. I will give 30 days supply of his Aldactone. She need to come in for potassium check and A1C for her DM2.

## 2016-07-28 NOTE — Telephone Encounter (Signed)
Patient need to come in for lab work. Only 30 days supply given for now till she follow up.

## 2016-07-28 NOTE — Telephone Encounter (Signed)
Lab appointment made for 1030 on 08-03-16. Deaire Mcwhirter,CMA

## 2016-08-03 ENCOUNTER — Other Ambulatory Visit: Payer: Self-pay | Admitting: Family Medicine

## 2016-08-03 ENCOUNTER — Other Ambulatory Visit (INDEPENDENT_AMBULATORY_CARE_PROVIDER_SITE_OTHER): Payer: BLUE CROSS/BLUE SHIELD

## 2016-08-03 ENCOUNTER — Telehealth: Payer: Self-pay | Admitting: *Deleted

## 2016-08-03 DIAGNOSIS — I10 Essential (primary) hypertension: Secondary | ICD-10-CM

## 2016-08-03 DIAGNOSIS — R7303 Prediabetes: Secondary | ICD-10-CM

## 2016-08-03 LAB — BASIC METABOLIC PANEL WITH GFR
BUN: 10 mg/dL (ref 7–25)
CO2: 25 mmol/L (ref 20–31)
Calcium: 9.8 mg/dL (ref 8.6–10.4)
Chloride: 106 mmol/L (ref 98–110)
Creat: 0.93 mg/dL (ref 0.50–1.05)
GFR, Est African American: 81 mL/min (ref >=60)
GFR, Est Non African American: 70 mL/min (ref >=60)
Glucose, Bld: 92 mg/dL (ref 65–99)
Potassium: 4.2 mmol/L (ref 3.5–5.3)
Sodium: 139 mmol/L (ref 135–146)

## 2016-08-03 LAB — POCT GLYCOSYLATED HEMOGLOBIN (HGB A1C): Hemoglobin A1C: 5.8

## 2016-08-03 NOTE — Progress Notes (Signed)
Here for lab

## 2016-08-03 NOTE — Addendum Note (Signed)
Addended by: [place] Pink on: 08/03/2016 12:11 PM   Modules accepted: Orders

## 2016-08-03 NOTE — Telephone Encounter (Signed)
LM for patient ok per DPR with A1C results and also for her to call us back to schedule an appointment to discuss her BP.  I have also sent patient a mychart message with the same things in it. Jazmin Hartsell,CMA

## 2016-08-03 NOTE — Telephone Encounter (Signed)
-----   Message from Kinnie Feil, MD sent at 08/03/2016 12:33 PM EST ----- Please advise patient. A1C still prediabetic at 5.8.  Repeat A1C in 6-12 months. Please help her schedule appointment with me for BP assessment and management.

## 2016-08-04 ENCOUNTER — Telehealth: Payer: Self-pay | Admitting: Family Medicine

## 2016-08-04 ENCOUNTER — Other Ambulatory Visit: Payer: Self-pay | Admitting: Family Medicine

## 2016-08-04 DIAGNOSIS — I10 Essential (primary) hypertension: Secondary | ICD-10-CM

## 2016-08-04 MED ORDER — SPIRONOLACTONE 25 MG PO TABS: ORAL_TABLET | ORAL | 1 refills | Status: DC

## 2016-08-04 NOTE — Telephone Encounter (Signed)
I spoke with patient about her result. I will refill her Aldactone for 90 days supply. She will call back to schedule follow up for her BP. She is doing well otherwise.

## 2016-08-04 NOTE — Telephone Encounter (Signed)
I called to discuss lab result with her. However she did not pick up her phone. HIPPA compliant message left for her to call back. I will like to see her soon as well for BP check.

## 2016-08-04 NOTE — Telephone Encounter (Signed)
Pt was returning your call. Please call back. I offered to make an appointment for a BP check, but pt declined at this time and will schedule one when she gets a new work schedule. ep

## 2016-09-20 ENCOUNTER — Ambulatory Visit: Payer: BLUE CROSS/BLUE SHIELD | Admitting: Family Medicine

## 2016-09-27 ENCOUNTER — Encounter: Payer: Self-pay | Admitting: Family Medicine

## 2016-09-27 ENCOUNTER — Ambulatory Visit (INDEPENDENT_AMBULATORY_CARE_PROVIDER_SITE_OTHER): Payer: BLUE CROSS/BLUE SHIELD | Admitting: Family Medicine

## 2016-09-27 VITALS — BP 134/64 | HR 74 | Temp 98.1°F | Ht 65.0 in | Wt 189.0 lb

## 2016-09-27 DIAGNOSIS — I10 Essential (primary) hypertension: Secondary | ICD-10-CM

## 2016-09-27 DIAGNOSIS — E039 Hypothyroidism, unspecified: Secondary | ICD-10-CM

## 2016-09-27 DIAGNOSIS — H6120 Impacted cerumen, unspecified ear: Secondary | ICD-10-CM | POA: Insufficient documentation

## 2016-09-27 DIAGNOSIS — R7303 Prediabetes: Secondary | ICD-10-CM

## 2016-09-27 DIAGNOSIS — H6122 Impacted cerumen, left ear: Secondary | ICD-10-CM

## 2016-09-27 MED ORDER — SPIRONOLACTONE 25 MG PO TABS: ORAL_TABLET | ORAL | 1 refills | Status: DC

## 2016-09-27 MED ORDER — AMLODIPINE BESYLATE 10 MG PO TABS: 10.0000 mg | ORAL_TABLET | Freq: Every day | ORAL | 2 refills | Status: DC

## 2016-09-27 MED ORDER — MOMETASONE FUROATE 50 MCG/ACT NA SUSP: 2.0000 | Freq: Every day | NASAL | 12 refills | Status: DC

## 2016-09-27 MED ORDER — CARVEDILOL 6.25 MG PO TABS: 6.2500 mg | ORAL_TABLET | Freq: Two times a day (BID) | ORAL | 2 refills | Status: DC

## 2016-09-27 NOTE — Progress Notes (Signed)
Subjective:     Patient ID: Teresa [name], female   DOB: 1960-12-05, 56 y.o.   MRN: [id number]  Hypertension  This is a chronic problem. The current episode started more than 1 year ago (diagnosed more than 30 yrs ago). The problem has been gradually improving since onset. The problem is controlled. Pertinent negatives include no blurred vision, chest pain, headaches, neck pain, orthopnea, palpitations, peripheral edema or shortness of breath. Agents associated with hypertension include thyroid hormones. Risk factors: Pre-DM. Past treatments include beta blockers, lifestyle changes, calcium channel blockers and angiotensin blockers (Coreg 12.5 mg daily. Norvasc 10 mg qd and Aldactone 25 mg qd). The current treatment provides moderate improvement. There are no compliance problems.  Identifiable causes of hypertension include a thyroid problem. There is no history of sleep apnea.  Ear Fullness   There is pain in the left ear. This is a chronic problem. The current episode started more than 1 month ago (more than 6 months). The problem has been waxing and waning (usually occurs in the spring season). There has been no fever. The pain is at a severity of 0/10 (Currently no pain but do have shooting pain occasionally). Pertinent negatives include no headaches or neck pain. Treatments tried: She used antibiotic in the past, not on med for it currently. The treatment provided no relief. There is no history of a chronic ear infection or hearing loss.  Thyroid Problem  Presents for follow-up visit. Symptoms include weight gain. Patient reports no anxiety, cold intolerance, palpitations, tremors or visual change. The symptoms have been stable.  She is compliant with med. Pre-DM: She is working on her diet and exercise. No concern.  Current Outpatient Prescriptions on File Prior to Visit  Medication Sig Dispense Refill  . cycloSPORINE (RESTASIS) 0.05 % ophthalmic emulsion Place 1 drop into both eyes 2 (two) times  daily.     . Hypromellose (ARTIFICIAL TEARS OP) Place 1 drop into both eyes as needed (for dry eyes). Reported on 08/13/2015    . levothyroxine (SYNTHROID, LEVOTHROID) 112 MCG tablet TAKE ONE TABLET BY MOUTH ONCE DAILY BEFORE BREAKFAST 90 tablet 1  . BIOTIN PO Take 1 tablet by mouth daily.     . Cyanocobalamin (B-12 PO) Take 1 tablet by mouth daily.      No current facility-administered medications on file prior to visit.    Past Medical History:  Diagnosis Date  . Anemia   . Cervical disc disorder with radiculopathy of cervical region 02/10/2016  . Chronic lower back pain   . GERD (gastroesophageal reflux disease)   . H/O hiatal hernia   . History of amputation of lesser toe of left foot ([place]) 03/11/2015  . Hypertension   . Hyperthyroidism    hx  . Hypothyroidism   . Migraine headache    "a few times/yr" (03/05/2015)  . Neck strain 02/06/2012  . Osteomyelitis ([place])    [name] 03/05/2015  . Osteomyelitis of toe of left foot ([place]) 03/05/2015  . Palpitations 01/05/2016  . Positive TB test    Vitals:   09/27/16 0926  BP: 134/64  Pulse: 74  Temp: 98.1 F (36.7 C)  TempSrc: Oral  SpO2: 98%  Weight: 189 lb (85.7 kg)  Height: 5\' 5"  (1.651 m)     Review of Systems  Constitutional: Positive for weight gain.  Eyes: Negative for blurred vision.  Respiratory: Negative for shortness of breath.   Cardiovascular: Negative for chest pain, palpitations and orthopnea.  Endocrine: Negative for cold intolerance.  Musculoskeletal:  Negative for neck pain.  Neurological: Negative for tremors and headaches.  Psychiatric/Behavioral: The patient is not nervous/anxious.        Objective:   Physical Exam  Constitutional: She is oriented to person, place, and time. She appears well-developed. No distress.  HENT:  Head: Normocephalic.  Right Ear: Tympanic membrane, external ear and ear canal normal.  Ears:  Mouth/Throat: Uvula is midline, oropharynx is clear and moist and mucous membranes are  normal.  Cardiovascular: Normal rate, regular rhythm and normal heart sounds.   No murmur heard. Pulmonary/Chest: Effort normal and breath sounds normal. No respiratory distress. She has no wheezes.  Abdominal: Soft. Bowel sounds are normal. She exhibits no distension and no mass. There is no tenderness.  Musculoskeletal: Normal range of motion. She exhibits no edema.  Neurological: She is alert and oriented to person, place, and time.  Nursing note and vitals reviewed.      Assessment:     HTN Cerumen impaction Hypothyroidism Pre-DM    Plan:     Check problem list.

## 2016-09-27 NOTE — Assessment & Plan Note (Signed)
BP looks fine. I refilled her meds. She has been taking Coreg 12.5 mg qd instead of daily. I changed her dose to 6.25mg  BID. Continue home BP monitoring. Bmet checked today. F/U in 3 months prn.

## 2016-09-27 NOTE — Assessment & Plan Note (Signed)
Diet and exercise counseling done. She is not interested in starting Metformin at this time which is ok.

## 2016-09-27 NOTE — Patient Instructions (Signed)
  Please obtain Debrox OTC at the pharmacy.  Earwax Buildup Your ears make a substance called earwax. It may also be called cerumen. Sometimes, too much earwax builds up in your ear canal. This can cause ear pain and make it harder for you to hear. CAUSES This condition is caused by too much earwax production or buildup. RISK FACTORS The following factors may make you more likely to develop this condition:  Cleaning your ears often with swabs.  Having narrow ear canals.  Having earwax that is overly thick or sticky.  Having eczema.  Being dehydrated. SYMPTOMS Symptoms of this condition include:  Reduced hearing.  Ear drainage.  Ear pain.  Ear itch.  A feeling of fullness in the ear or feeling that the ear is plugged.  Ringing in the ear.  Coughing. DIAGNOSIS Your health care provider can diagnose this condition based on your symptoms and medical history. Your health care provider will also do an ear exam to look inside your ear with a scope (otoscope). You may also have a hearing test. TREATMENT Treatment for this condition includes:  Over-the-counter or prescription ear drops to soften the earwax.  Earwax removal by a health care provider. This may be done:  By flushing the ear with body-temperature water.  With a medical instrument that has a loop at the end (earwax curette).  With a suction device. HOME CARE INSTRUCTIONS  Take over-the-counter and prescription medicines only as told by your health care provider.  Do not put any objects, including an ear swab, into your ear. You can clean the opening of your ear canal with a washcloth.  Drink enough water to keep your urine clear or pale yellow.  If you have frequent earwax buildup or you use hearing aids, consider seeing your health care provider every 6-12 months for routine preventive ear cleanings. Keep all follow-up visits as told by your health care provider. SEEK MEDICAL CARE IF:  You have ear  pain.  Your condition does not improve with treatment.  You have hearing loss.  You have blood, pus, or other fluid coming from your ear. This information is not intended to replace advice given to you by your health care provider. Make sure you discuss any questions you have with your health care provider. Document Released: 07/21/2004 Document Revised: 10/05/2015 Document Reviewed: 01/28/2015 Elsevier Interactive Patient Education  2017 [employer].

## 2016-09-27 NOTE — Assessment & Plan Note (Signed)
After obtaining verbal consent, ear irrigation was completed. Soft ear curette was later used to remove dry remnant. Patient tolerated procedure well and felt better. OTC debrox recommended prn.

## 2016-09-27 NOTE — Assessment & Plan Note (Signed)
TSH rechecked. I will refill synthroid based on result. Otherwise no acute symptoms.

## 2016-09-28 LAB — BASIC METABOLIC PANEL
BUN/Creatinine Ratio: 11 (ref 9–23)
BUN: 9 mg/dL (ref 6–24)
CO2: 27 mmol/L (ref 18–29)
Calcium: 9.6 mg/dL (ref 8.7–10.2)
Chloride: 99 mmol/L (ref 96–106)
Creatinine, Ser: 0.83 mg/dL (ref 0.57–1.00)
GFR calc Af Amer: 92 mL/min/{1.73_m2} (ref >59)
GFR calc non Af Amer: 80 mL/min/{1.73_m2} (ref >59)
Glucose: 117 mg/dL — ABNORMAL HIGH (ref 65–99)
Potassium: 3.9 mmol/L (ref 3.5–5.2)
Sodium: 141 mmol/L (ref 134–144)

## 2016-09-28 LAB — TSH: TSH: 4.1 u[IU]/mL (ref 0.450–4.500)

## 2016-10-11 ENCOUNTER — Ambulatory Visit (INDEPENDENT_AMBULATORY_CARE_PROVIDER_SITE_OTHER): Payer: BLUE CROSS/BLUE SHIELD | Admitting: Family Medicine

## 2016-10-11 ENCOUNTER — Encounter: Payer: Self-pay | Admitting: Family Medicine

## 2016-10-11 DIAGNOSIS — M25562 Pain in left knee: Secondary | ICD-10-CM | POA: Diagnosis not present

## 2016-10-11 DIAGNOSIS — M79671 Pain in right foot: Secondary | ICD-10-CM | POA: Insufficient documentation

## 2016-10-11 DIAGNOSIS — G8929 Other chronic pain: Secondary | ICD-10-CM | POA: Diagnosis not present

## 2016-10-11 DIAGNOSIS — M25572 Pain in left ankle and joints of left foot: Secondary | ICD-10-CM | POA: Diagnosis not present

## 2016-10-11 MED ORDER — NAPROXEN 500 MG PO TABS: 500.0000 mg | ORAL_TABLET | Freq: Two times a day (BID) | ORAL | 0 refills | Status: DC

## 2016-10-11 NOTE — Progress Notes (Signed)
Subjective:     Patient ID: Teresa [name], female   DOB: 12-11-1960, 56 y.o.   MRN: [id number]  Knee Pain   The incident occurred more than 1 week ago (This has been on going on and off for more than 6 months). The incident occurred at home. There was no injury mechanism. The pain is present in the left knee (There is associated knee pain and swelling). The pain is at a severity of 2/10 (Pain can be as bad as 10/10 in severity). The pain is moderate (She was out of work for 2 days 1 week ago). The pain has been intermittent since onset. Associated symptoms include an inability to bear weight. Pertinent negatives include no loss of motion, numbness or tingling. Associated symptoms comments: Left foot pain is mostly around her ankle and worsens with prolonged standing. Right foot pain is on her heel and occurs the first few hours of the day after she wakes up.. The symptoms are aggravated by movement and weight bearing. She has tried nothing for the symptoms.  Ankle Pain   The incident occurred more than 1 week ago (More than 1 yr ago. She has pain and intermittent swelling). There was no injury mechanism. The pain is present in the left ankle, left foot, right ankle, right heel and right foot. The quality of the pain is described as aching. The pain is at a severity of 5/10. The patient is experiencing no pain. Associated symptoms include an inability to bear weight. Pertinent negatives include no loss of motion, numbness or tingling. The symptoms are aggravated by movement and weight bearing. She has tried nothing for the symptoms.   Current Outpatient Prescriptions on File Prior to Visit  Medication Sig Dispense Refill  . amLODipine (NORVASC) 10 MG tablet Take 1 tablet (10 mg total) by mouth daily. 90 tablet 2  . carvedilol (COREG) 6.25 MG tablet Take 1 tablet (6.25 mg total) by mouth 2 (two) times daily with a meal. 180 tablet 2  . levothyroxine (SYNTHROID, LEVOTHROID) 112 MCG tablet TAKE ONE TABLET BY  MOUTH ONCE DAILY BEFORE BREAKFAST 90 tablet 1  . spironolactone (ALDACTONE) 25 MG tablet TAKE ONE TABLET BY MOUTH ONCE DAILY 90 tablet 1  . BIOTIN PO Take 1 tablet by mouth daily.     . Cyanocobalamin (B-12 PO) Take 1 tablet by mouth daily.     . cycloSPORINE (RESTASIS) 0.05 % ophthalmic emulsion Place 1 drop into both eyes 2 (two) times daily.     . Hypromellose (ARTIFICIAL TEARS OP) Place 1 drop into both eyes as needed (for dry eyes). Reported on 08/13/2015    . mometasone (NASONEX) 50 MCG/ACT nasal spray Place 2 sprays into the nose daily. 17 g 12   No current facility-administered medications on file prior to visit.    Past Medical History:  Diagnosis Date  . Anemia   . Cervical disc disorder with radiculopathy of cervical region 02/10/2016  . Chronic lower back pain   . GERD (gastroesophageal reflux disease)   . H/O hiatal hernia   . History of amputation of lesser toe of left foot ([place]) 03/11/2015  . Hypertension   . Hyperthyroidism    hx  . Hypothyroidism   . Migraine headache    "a few times/yr" (03/05/2015)  . Neck strain 02/06/2012  . Osteomyelitis ([place])    [name] 03/05/2015  . Osteomyelitis of toe of left foot ([place]) 03/05/2015  . Palpitations 01/05/2016  . Positive TB test    Vitals:  10/11/16 1000  BP: 134/70  Pulse: 75  Temp: 98 F (36.7 C)  TempSrc: Oral  SpO2: 99%  Weight: 190 lb (86.2 kg)  Height: 5\' 5"  (1.651 m)     Review of Systems  Respiratory: Negative.   Cardiovascular: Negative.   Gastrointestinal: Negative.   Musculoskeletal: Positive for arthralgias and joint swelling.  Neurological: Negative for tingling and numbness.  All other systems reviewed and are negative.      Objective:   Physical Exam  Constitutional: She is oriented to person, place, and time. She appears well-developed. No distress.  Cardiovascular: Normal rate, regular rhythm and normal heart sounds.   No murmur heard. Pulmonary/Chest: Effort normal and breath sounds normal. No  respiratory distress. She has no wheezes. She exhibits no tenderness.  Abdominal: Soft. Bowel sounds are normal. She exhibits no distension and no mass. There is no tenderness.  Musculoskeletal: Normal range of motion. She exhibits no edema.       Right knee: Normal. She exhibits normal range of motion and no swelling. No tenderness found.       Left knee: She exhibits effusion. She exhibits no deformity and no erythema. No tenderness found.       Right ankle: Normal.       Left ankle: Tenderness. Lateral malleolus tenderness found.       Legs: Neurological: She is alert and oriented to person, place, and time. She has normal strength and normal reflexes. No cranial nerve deficit or sensory deficit.  Nursing note and vitals reviewed.      Assessment:     Left knee pain Left ankle pain Right heel pain    Plan:     Check problem list.

## 2016-10-11 NOTE — Assessment & Plan Note (Signed)
Recent xray showed OA. Effusion very minimal hence no aspiration warranted. No sign suggestive of septic joint. Start NSAID ( Naproxen) prn pain. Consider knee joint MRI if symptoms worsens. Home exercise recommended.

## 2016-10-11 NOTE — Patient Instructions (Signed)

## 2016-10-11 NOTE — Assessment & Plan Note (Signed)
Likely planta fascitis. Conservative measure discussed. Naproxen prn pain for now. F/U as needed.

## 2016-10-11 NOTE — Assessment & Plan Note (Signed)
No recent injury. Likely OA. Hold xray for now. Trial of NSAID in the mean time. I will image her if no improvement.

## 2016-10-12 ENCOUNTER — Other Ambulatory Visit: Payer: Self-pay | Admitting: Family Medicine

## 2016-10-12 MED ORDER — NAPROXEN 500 MG PO TABS: 500.0000 mg | ORAL_TABLET | Freq: Two times a day (BID) | ORAL | 0 refills | Status: DC

## 2016-10-12 NOTE — Telephone Encounter (Signed)
Pt states Naproxen was called into the wrong pharmacy. Pt uses [facility] on [address] Pt would also like [place] to change pharmacy preference in the computer. ep

## 2016-10-12 NOTE — Telephone Encounter (Signed)
Rx canceled verbally @ [facility] and sent electronically to [address].  Took [place] out of chart. [place], Salome Spotted, CMA

## 2016-10-28 ENCOUNTER — Ambulatory Visit (INDEPENDENT_AMBULATORY_CARE_PROVIDER_SITE_OTHER): Payer: BLUE CROSS/BLUE SHIELD | Admitting: Family Medicine

## 2016-10-28 ENCOUNTER — Ambulatory Visit (HOSPITAL_COMMUNITY)
Admission: RE | Admit: 2016-10-28 | Discharge: 2016-10-28 | Disposition: A | Discharge status: Home / Self Care | Payer: BLUE CROSS/BLUE SHIELD | Source: Ambulatory Visit | Attending: Family Medicine | Admitting: Family Medicine

## 2016-10-28 ENCOUNTER — Encounter: Payer: Self-pay | Admitting: Family Medicine

## 2016-10-28 VITALS — BP 160/80 | HR 72 | Temp 98.1°F | Ht 65.0 in | Wt 192.6 lb

## 2016-10-28 DIAGNOSIS — M25562 Pain in left knee: Secondary | ICD-10-CM

## 2016-10-28 DIAGNOSIS — M769 Unspecified enthesopathy, lower limb, excluding foot: Secondary | ICD-10-CM | POA: Diagnosis not present

## 2016-10-28 MED ORDER — METHYLPREDNISOLONE ACETATE 40 MG/ML IJ SUSP
40.0000 mg | Freq: Once | INTRAMUSCULAR | Status: AC
  Administered 2016-10-28: 40 mg via INTRA_ARTICULAR

## 2016-10-28 NOTE — Progress Notes (Signed)
    Subjective:  Teresa [name] is a 56 y.o. female who presents to the [facility] today with a chief complaint of L knee pain.   HPI:  Patient has long standing L knee pain going back over the last year. Was told that it was due to her osteoarthritis at that time. Was recently seen in [facility] on 10/11/16 for an acute episode of L knee pain. States has had worsening over the last 2 weeks, on the lateral side, can bear weight at the beginning of the day but exacerbated with use - she works at [employer] and on her feet all day and has difficulty standing or walking at the end of the day. Has noticed some swelling in the side and in the back of her knee. Is currently in "a lot of pain" today. No numbness/weakness, no changes in sensation, no paresthesias.   ROS: Per HPI  Objective:  Physical Exam: BP (!) 160/80 (BP Location: Right Arm, Patient Position: Sitting, Cuff Size: Normal)   Pulse 72   Temp 98.1 F (36.7 C) (Oral)   Ht 5\' 5"  (1.651 m)   Wt 192 lb 9.6 oz (87.4 kg)   SpO2 98%   BMI 32.05 kg/m   Gen: NAD, resting comfortably GI: Normal bowel sounds present. Soft, Nontender, Nondistended. MSK: L knee non erythematous and no erythema but with lateral and posterior effusion, TTP over lateral edge of tibial plateau. Reduced active ROM with flexion d/t pain. Neg varus/valgus test. Unable to perform McMurrays or Thessaly d/t pain. Skin: warm, dry. No rashes   Assessment/Plan:  Lateral knee pain, left Since last xray of L knee was in Feb 2017 and patient has had acute worsening over the last 2 weeks and has failed conservative management with po NSAIDs, will get imaging today. Suspect worsening of osteoarthritis. - Steroid injection of L knee, procedure note as below.  - continue naproxen - OTC capsacin cream - Xray L knee  - Follow up with PCP.    INJECTION: Patient was given informed consent, signed copy in the chart. Appropriate time out was taken. Area prepped and draped in usual  sterile fashion. 1 cc of methylprednisolone 40 mg/ml plus  3 cc of 1% lidocaine without epinephrine was injected into the L knee using a lateral approach. The patient tolerated the procedure well. There were no complications. Post procedure instructions were given.  Bufford Lope, DO PGY-1, [facility] 10/28/2016 11:07 AM

## 2016-10-28 NOTE — Assessment & Plan Note (Signed)
Since last xray of L knee was in Feb 2017 and patient has had acute worsening over the last 2 weeks and has failed conservative management with po NSAIDs, will get imaging today. Suspect worsening of osteoarthritis. - Steroid injection of L knee, procedure note as below.  - continue naproxen - OTC capsacin cream - Xray L knee  - Follow up with PCP.

## 2016-10-28 NOTE — Patient Instructions (Signed)
It was good to see you today.   For your knee pain, - You were given a steroid injection today. - We are getting xrays, you can walk over to the hospital and get these done at the radiology department. - Keep taking your naproxen - You can try OTC capsacin cream as needed for symptomatic relief.  We are checking some imaging today, and someone will call you or send you a letter with the results when they are available.   Take care and seek immediate care sooner if you develop any concerns.   Dr. Bufford Lope, [place]

## 2016-11-01 ENCOUNTER — Other Ambulatory Visit: Payer: Self-pay | Admitting: Family Medicine

## 2016-11-01 DIAGNOSIS — Z1231 Encounter for screening mammogram for malignant neoplasm of breast: Secondary | ICD-10-CM

## 2016-11-04 ENCOUNTER — Ambulatory Visit (INDEPENDENT_AMBULATORY_CARE_PROVIDER_SITE_OTHER): Payer: BLUE CROSS/BLUE SHIELD | Admitting: Family Medicine

## 2016-11-04 ENCOUNTER — Encounter: Payer: Self-pay | Admitting: Family Medicine

## 2016-11-04 VITALS — BP 120/70 | HR 75 | Temp 98.3°F | Ht 65.0 in | Wt 191.6 lb

## 2016-11-04 DIAGNOSIS — E039 Hypothyroidism, unspecified: Secondary | ICD-10-CM

## 2016-11-04 DIAGNOSIS — K5900 Constipation, unspecified: Secondary | ICD-10-CM | POA: Diagnosis not present

## 2016-11-04 DIAGNOSIS — R1013 Epigastric pain: Secondary | ICD-10-CM

## 2016-11-04 DIAGNOSIS — G8929 Other chronic pain: Secondary | ICD-10-CM

## 2016-11-04 MED ORDER — POLYETHYLENE GLYCOL 3350 17 GM/SCOOP PO POWD: 17.0000 g | Freq: Two times a day (BID) | ORAL | 2 refills | Status: DC

## 2016-11-04 MED ORDER — PSYLLIUM 28 % PO PACK: 1.0000 | PACK | Freq: Two times a day (BID) | ORAL | 2 refills | Status: DC

## 2016-11-04 NOTE — Patient Instructions (Signed)
It was a pleasure seeing you today in our clinic. Today we discussed your abdominal pain and constipation. Here is the treatment plan we have discussed and agreed upon together:   - Increase your activity level with at least 30 minutes of exercise daily. - I prescribed Metamucil. Take this 2 times a day every day until your first bowel movement and for 48 hours after. Then decrease this to 1 time daily. - I prescribed MiraLAX. Take this 2-3 times every day until you have your first bowel movement and for 48 hours after. Then begin to titrate this with a goal of 1-2 soft but not loose bowel movements a day. - I have sent a referral to general surgery to have you evaluated for your hernia.

## 2016-11-04 NOTE — Progress Notes (Signed)
   HPI  CC: ABDOMINAL PAIN Been going on for "years", "ain't nobody listen to me". Pain is epigastric "in my hernia". Hasn't had a "decent" BM in over a month. Had to  "pull something out" last night.  Medications tried: miralax, OTC enemas, salads, raisins Similar pain before: yes Prior abdominal surgeries: C-section x3; hysterectomy; appendectomy   Symptoms Nausea/vomiting: only nausea Diarrhea: no Constipation: yes Blood in stool: has had small amounts in the past; none recently Blood in vomit: no Fever: no Dysuria: no Loss of appetite: yes, due to nausea Weight loss: no  Vaginal Bleeding: s/p hysterectomy Missed menstrual period: n/a  Review of Symptoms - see HPI PMH - Smoking status noted.    CC, SH/smoking status, and VS noted  Objective: BP 120/70 (BP Location: Left Arm, Patient Position: Sitting, Cuff Size: Normal)   Pulse 75   Temp 98.3 F (36.8 C) (Oral)   Ht 5\' 5"  (1.651 m)   Wt 191 lb 9.6 oz (86.9 kg)   SpO2 97%   BMI 31.88 kg/m  Gen: NAD, alert, cooperative, and well appearing CV: RRR, no murmur Resp: CTAB, no wheezes, non-labored Abd: S, ND, TTP at the epigastrium and LUQ, BS present, no guarding or organomegaly, no masses, well-healed midline surgical scar noted inferior to the umbilicus. Ext: No edema, warm Neuro: Alert and oriented, Speech clear, No gross deficits   Assessment and plan:  Constipation Patient is here with signs and symptoms consistent with constipation. Underlying etiology unknown however most likely secondary to decreased bowel motility. Patient has a history for hypothyroidism. Most recent TSH was on the high and of normal. Consideration for possible subclinical hypothyroidism on current Synthroid dose as a contributing factor. - Encouraged increased exercise/activity - Metamucil twice a day - MiraLAX to be titrated until having 1-2 soft BMs a day. - Referral to general surgery  - Patient was insistent that she has a prior  diagnosis of a "hiatal hernia". Patient strongly believe that this was a contributor in factor to her constipation. Although I do not believe this is contributing to her constipation, this may be causing some of her epigastric discomfort.  Hypothyroidism See above. - Obtain labs today: TSH, T3/T4. - Strong consideration for increasing Synthroid dose.   Orders Placed This Encounter  Procedures  . TSH  . T3, Free  . T4, Free  . Ambulatory referral to General Surgery    Referral Priority:   Routine    Referral Type:   Surgical    Referral Reason:   Specialty Services Required    Requested Specialty:   General Surgery    Number of Visits Requested:   1    Meds ordered this encounter  Medications  . polyethylene glycol powder (GLYCOLAX/MIRALAX) powder    Sig: Take 17 g by mouth 2 (two) times daily.    Dispense:  850 g    Refill:  2  . psyllium (METAMUCIL SMOOTH TEXTURE) 28 % packet    Sig: Take 1 packet by mouth 2 (two) times daily. Decrease to once daily 2-3 days after your first bowel movement.    Dispense:  90 packet    Refill:  2     Elberta Leatherwood, MD,MS,  PGY3 11/04/2016 12:09 PM

## 2016-11-04 NOTE — Assessment & Plan Note (Signed)
See above. - Obtain labs today: TSH, T3/T4. - Strong consideration for increasing Synthroid dose.

## 2016-11-04 NOTE — Assessment & Plan Note (Signed)
Patient is here with signs and symptoms consistent with constipation. Underlying etiology unknown however most likely secondary to decreased bowel motility. Patient has a history for hypothyroidism. Most recent TSH was on the high and of normal. Consideration for possible subclinical hypothyroidism on current Synthroid dose as a contributing factor. - Encouraged increased exercise/activity - Metamucil twice a day - MiraLAX to be titrated until having 1-2 soft BMs a day. - Referral to general surgery  - Patient was insistent that she has a prior diagnosis of a "hiatal hernia". Patient strongly believe that this was a contributor in factor to her constipation. Although I do not believe this is contributing to her constipation, this may be causing some of her epigastric discomfort.

## 2016-11-05 LAB — T3, FREE: T3, Free: 3.2 pg/mL (ref 2.0–4.4)

## 2016-11-05 LAB — TSH: TSH: 1.71 u[IU]/mL (ref 0.450–4.500)

## 2016-11-05 LAB — T4, FREE: Free T4: 1.65 ng/dL (ref 0.82–1.77)

## 2016-11-14 ENCOUNTER — Encounter: Payer: Self-pay | Admitting: Family Medicine

## 2016-11-17 ENCOUNTER — Other Ambulatory Visit: Payer: Self-pay | Admitting: Family Medicine

## 2016-11-17 DIAGNOSIS — R1084 Generalized abdominal pain: Secondary | ICD-10-CM

## 2016-11-17 DIAGNOSIS — K5900 Constipation, unspecified: Secondary | ICD-10-CM

## 2016-11-22 ENCOUNTER — Encounter: Payer: Self-pay | Admitting: Physician Assistant

## 2016-11-23 ENCOUNTER — Ambulatory Visit
Admission: RE | Admit: 2016-11-23 | Discharge: 2016-11-23 | Disposition: A | Discharge status: Home / Self Care | Payer: BLUE CROSS/BLUE SHIELD | Source: Ambulatory Visit | Attending: Family Medicine | Admitting: Family Medicine

## 2016-11-23 DIAGNOSIS — Z1231 Encounter for screening mammogram for malignant neoplasm of breast: Secondary | ICD-10-CM

## 2016-11-28 ENCOUNTER — Ambulatory Visit (INDEPENDENT_AMBULATORY_CARE_PROVIDER_SITE_OTHER): Payer: BLUE CROSS/BLUE SHIELD | Admitting: Physician Assistant

## 2016-11-28 ENCOUNTER — Encounter: Payer: Self-pay | Admitting: Physician Assistant

## 2016-11-28 VITALS — BP 108/60 | HR 96 | Ht 65.0 in | Wt 192.4 lb

## 2016-11-28 DIAGNOSIS — Z800 Family history of malignant neoplasm of digestive organs: Secondary | ICD-10-CM

## 2016-11-28 DIAGNOSIS — R194 Change in bowel habit: Secondary | ICD-10-CM | POA: Diagnosis not present

## 2016-11-28 DIAGNOSIS — R1013 Epigastric pain: Secondary | ICD-10-CM

## 2016-11-28 DIAGNOSIS — K5909 Other constipation: Secondary | ICD-10-CM | POA: Diagnosis not present

## 2016-11-28 DIAGNOSIS — R1319 Other dysphagia: Secondary | ICD-10-CM | POA: Diagnosis not present

## 2016-11-28 DIAGNOSIS — K219 Gastro-esophageal reflux disease without esophagitis: Secondary | ICD-10-CM | POA: Diagnosis not present

## 2016-11-28 DIAGNOSIS — G8929 Other chronic pain: Secondary | ICD-10-CM

## 2016-11-28 MED ORDER — PANTOPRAZOLE SODIUM 40 MG PO TBEC: 40.0000 mg | DELAYED_RELEASE_TABLET | Freq: Every day | ORAL | 11 refills | Status: DC

## 2016-11-28 MED ORDER — NA SULFATE-K SULFATE-MG SULF 17.5-3.13-1.6 GM/177ML PO SOLN
1.0000 | Freq: Once | ORAL | 0 refills | Status: AC
Start: 2016-11-28 — End: 2016-11-28

## 2016-11-28 NOTE — Progress Notes (Addendum)
Subjective:    Patient ID: Teresa [name], female    DOB: Oct 24, 1960, 56 y.o.   MRN: [id number]  HPI Teresa [name] is a 56 year old African-American female, referred by Dr. Loa Socks McKeag/Dr. Olevia Bowens [facility] for evaluation of constipation and abdominal pain. Patient has history of hypertension, GERD, hypothyroidism and is status post C-section 3 hysterectomy and appendectomy. She relates prior colonoscopy done in 2014 for screening. This was done by a [facility]. Her report is scanned and shows one 3 mm sigmoid polyp removed. We do not have the path report that she was asked to follow-up in 5 years. She also has family history of colon cancer in her father. Patient says she's been having significant difficulty with constipation since March 2018. She says she was fairly regular prior to that time. She had been drinking a ginger lemon honey vinegar drink on a daily basis which she stopped when she went on a 40 day fast. She says this may have been helping her bowels because she became very constipated at that point. At this point she is having very small bowel movements on a daily basis but not passing much stool at all. She tried mag citrate and MiraLAX without much benefit. He is not noted any melena or hematochezia. She's also been having some upper abdominal discomfort and is concerned about her "hernia". She says she has been told she has a hiatal hernia and has history of GERD. She's had some increase in reflux symptoms recently and also complains of solid food dysphagia.  Review of Systems Pertinent positive and negative review of systems were noted in the above HPI section.  All other review of systems was otherwise negative.  Outpatient Encounter Prescriptions as of 11/28/2016  Medication Sig  . amLODipine (NORVASC) 10 MG tablet Take 1 tablet (10 mg total) by mouth daily.  . carvedilol (COREG) 6.25 MG tablet Take 1 tablet (6.25 mg total) by mouth 2 (two) times daily with a meal.   . Cyanocobalamin (B-12 PO) Take 1 tablet by mouth daily.   . cycloSPORINE (RESTASIS) 0.05 % ophthalmic emulsion Place 1 drop into both eyes 2 (two) times daily.   . Hypromellose (ARTIFICIAL TEARS OP) Place 1 drop into both eyes as needed (for dry eyes). Reported on 08/13/2015  . levothyroxine (SYNTHROID, LEVOTHROID) 112 MCG tablet TAKE ONE TABLET BY MOUTH ONCE DAILY BEFORE BREAKFAST  . mometasone (NASONEX) 50 MCG/ACT nasal spray Place 2 sprays into the nose daily.  . naproxen (NAPROSYN) 500 MG tablet Take 1 tablet (500 mg total) by mouth 2 (two) times daily with a meal.  . polyethylene glycol powder (GLYCOLAX/MIRALAX) powder Take 17 g by mouth 2 (two) times daily. (Patient taking differently: Take 17 g by mouth every other day. )  . spironolactone (ALDACTONE) 25 MG tablet TAKE ONE TABLET BY MOUTH ONCE DAILY  . Na Sulfate-K Sulfate-Mg Sulf 17.5-3.13-1.6 GM/180ML SOLN Take 1 kit by mouth once.  . pantoprazole (PROTONIX) 40 MG tablet Take 1 tablet (40 mg total) by mouth daily.  . [DISCONTINUED] BIOTIN PO Take 1 tablet by mouth daily.   . [DISCONTINUED] psyllium (METAMUCIL SMOOTH TEXTURE) 28 % packet Take 1 packet by mouth 2 (two) times daily. Decrease to once daily 2-3 days after your first bowel movement.   No facility-administered encounter medications on file as of 11/28/2016.    Allergies  Allergen Reactions  . Bactrim [Sulfamethoxazole-Trimethoprim] Anaphylaxis  . Clonidine Derivatives Swelling and Other (See Comments)    Facial swelling  . Aspirin  Nausea Only  . Latex Rash   Patient Active Problem List   Diagnosis Date Noted  . Constipation 11/04/2016  . Left ankle pain 10/11/2016  . Pain of right heel 10/11/2016  . Cerumen impaction 09/27/2016  . Prediabetes 10/29/2015  . Tendinopathy of left rotator cuff 10/27/2015  . Essential hypertension   . Obesity 04/15/2014  . Lateral knee pain, left 08/06/2013  . Allergic rhinitis 07/19/2013  . Thyroid nodule 02/12/2013  . GERD  (gastroesophageal reflux disease) 02/06/2012  . Tobacco abuse 09/21/2011  . Hypothyroidism 09/21/2011  . Throat pain 09/21/2011   Social History   Social History  . Marital status: Single    Spouse name: N/A  . Number of children: 3  . Years of education: N/A   Occupational History  . cosmetolgist    Social History Main Topics  . Smoking status: Current Every Day Smoker    Packs/day: 0.05    Years: 36.00    Types: Cigarettes  . Smokeless tobacco: Never Used  . Alcohol use No  . Drug use: No  . Sexual activity: Not on file   Other Topics Concern  . Not on file   Social History Narrative   Lives with two adult children.  Another daughter lives in [place].  Cosmetologist.  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  Likes to fish and play bingo.  Goes to church.    Teresa [name] family history includes Breast cancer in her maternal aunt and paternal aunt; Cancer (age of onset: 22) in her father; Diabetes in her brother, mother, other, and sister; Heart disease in her mother and other; Kidney disease in her brother and sister; Stroke in her father.      Objective:    Vitals:   11/28/16 1358  BP: 108/60  Pulse: 96    Physical Exam   well-developed African-American female in no acute distress, blood pressure 108/60 pulse 96, height 5 foot 5, weight 192, BMI of 32. HEENT ;nontraumatic normocephalic EOMI PERRLA sclera anicteric, Cardiovascular; regular rate and rhythm with S1-S2 no murmur or gallop, Pulmonary ;clear bilaterally, Abdomen ;obese, soft, bowel sounds are present she has now rather generalized abdominal tenderness is no guarding or rebound no palpable mass or hepatosplenomegaly she does have midline incisional scars, Rectal; exam not done, Extremities ;no clubbing cyanosis or edema skin warm and dry, Neuropsych; mood and affect appropriate       Assessment & Plan:   #77 56 year old African-American female with change in bowel habits over the past 3 months now with significant  constipation. Rule out functional constipation, rule out occult colon lesion #2 history of GERD with recent exacerbation of symptoms #3 mild solid food dysphagia rule out stricture #4 status post C-section 3, hysterectomy and appendectomy #5 personal history of colon polyps-type unclear last colonoscopy 2014 and recommended for 5 year interval follow-up #6 positive family history of colon cancer in patient's father  Plan;Patient will be scheduled for colonoscopy and EGD with possible dilation with Dr. Hilarie Fredrickson. Procedures discussed in detail with patient including risks and benefits and she is agreeable to proceed Patient will start bowel purge and is asked to take 4 doses of MiraLAX this evening, and 4 doses tomorrow prior to starting her colon prep Post colonoscopy would like her to try MiraLAX 17 g in 8 ounces of water twice daily Start Protonix 40 mg by mouth every morning. Antireflux regimen Further plans pending results of endoscopic evaluation.Glade Nurse Esterwood PA-C 11/28/2016   Cc: McKeag, Marylynn Pearson, MD  Addendum: Reviewed and agree with initial management. Pyrtle, Lajuan Lines, MD

## 2016-11-28 NOTE — Patient Instructions (Addendum)
Start protonix 40 mg, take 1 tablet very morning.  Start Miralax, take 4 doses ( 1 dose is 17 grams ) today.  Take 2 doses of Miralax tomorrow morning.   On a regular basis take 2 doses of Miralax daily.   You have been scheduled for a colonoscopy and Endoscopy. Please follow written instructions given to you at your visit today.  Please pick up your prep supplies at the pharmacy today . [employer]..  If you use inhalers (even only as needed), please bring them with you on the day of your procedure. Your physician has requested that you go to www.startemmi.com and enter the access code given to you at your visit today. This web site gives a general overview about your procedure. However, you should still follow specific instructions given to you by our office regarding your preparation for the procedure.

## 2016-11-29 ENCOUNTER — Other Ambulatory Visit: Payer: Self-pay | Admitting: *Deleted

## 2016-11-30 ENCOUNTER — Ambulatory Visit (AMBULATORY_SURGERY_CENTER): Payer: BLUE CROSS/BLUE SHIELD | Admitting: Internal Medicine

## 2016-11-30 ENCOUNTER — Other Ambulatory Visit: Payer: Self-pay | Admitting: Family Medicine

## 2016-11-30 ENCOUNTER — Encounter: Payer: Self-pay | Admitting: Family Medicine

## 2016-11-30 ENCOUNTER — Encounter: Payer: Self-pay | Admitting: Internal Medicine

## 2016-11-30 VITALS — BP 151/73 | HR 63 | Temp 99.6°F | Resp 15 | Ht 65.0 in | Wt 192.0 lb

## 2016-11-30 DIAGNOSIS — K2950 Unspecified chronic gastritis without bleeding: Secondary | ICD-10-CM | POA: Diagnosis not present

## 2016-11-30 DIAGNOSIS — K219 Gastro-esophageal reflux disease without esophagitis: Secondary | ICD-10-CM

## 2016-11-30 DIAGNOSIS — D120 Benign neoplasm of cecum: Secondary | ICD-10-CM | POA: Diagnosis not present

## 2016-11-30 DIAGNOSIS — R194 Change in bowel habit: Secondary | ICD-10-CM | POA: Diagnosis not present

## 2016-11-30 DIAGNOSIS — K635 Polyp of colon: Secondary | ICD-10-CM

## 2016-11-30 DIAGNOSIS — D125 Benign neoplasm of sigmoid colon: Secondary | ICD-10-CM

## 2016-11-30 DIAGNOSIS — R1319 Other dysphagia: Secondary | ICD-10-CM

## 2016-11-30 DIAGNOSIS — D122 Benign neoplasm of ascending colon: Secondary | ICD-10-CM

## 2016-11-30 DIAGNOSIS — R1310 Dysphagia, unspecified: Secondary | ICD-10-CM | POA: Diagnosis not present

## 2016-11-30 DIAGNOSIS — R1013 Epigastric pain: Secondary | ICD-10-CM | POA: Diagnosis not present

## 2016-11-30 MED ORDER — LEVOTHYROXINE SODIUM 112 MCG PO TABS: ORAL_TABLET | ORAL | 1 refills | Status: DC

## 2016-11-30 MED ORDER — SODIUM CHLORIDE 0.9 % IV SOLN: 500.0000 mL | INTRAVENOUS | Status: DC

## 2016-11-30 NOTE — Patient Instructions (Signed)
YOU HAD AN ENDOSCOPIC PROCEDURE TODAY AT [facility]:   Refer to the procedure report that was given to you for any specific questions about what was found during the examination.  If the procedure report does not answer your questions, please call your gastroenterologist to clarify.  If you requested that your care partner not be given the details of your procedure findings, then the procedure report has been included in a sealed envelope for you to review at your convenience later.  YOU SHOULD EXPECT: Some feelings of bloating in the abdomen. Passage of more gas than usual.  Walking can help get rid of the air that was put into your GI tract during the procedure and reduce the bloating. If you had a lower endoscopy (such as a colonoscopy or flexible sigmoidoscopy) you may notice spotting of blood in your stool or on the toilet paper. If you underwent a bowel prep for your procedure, you may not have a normal bowel movement for a few days.  Please Note:  You might notice some irritation and congestion in your nose or some drainage.  This is from the oxygen used during your procedure.  There is no need for concern and it should clear up in a day or so.  SYMPTOMS TO REPORT IMMEDIATELY:   Following lower endoscopy (colonoscopy or flexible sigmoidoscopy):  Excessive amounts of blood in the stool  Significant tenderness or worsening of abdominal pains  Swelling of the abdomen that is new, acute  Fever of 100F or higher   Following upper endoscopy (EGD)  Vomiting of blood or coffee ground material  New chest pain or pain under the shoulder blades  Painful or persistently difficult swallowing  New shortness of breath  Fever of 100F or higher  Black, tarry-looking stools  For urgent or emergent issues, a gastroenterologist can be reached at any hour by calling [phone].   DIET:  We do recommend a small meal at first, but then you may proceed to your regular diet.  Drink  plenty of fluids but you should avoid alcoholic beverages for 24 hours.  ACTIVITY:  You should plan to take it easy for the rest of today and you should NOT DRIVE or use heavy machinery until tomorrow (because of the sedation medicines used during the test).    FOLLOW UP: Our staff will call the number listed on your records the next business day following your procedure to check on you and address any questions or concerns that you may have regarding the information given to you following your procedure. If we do not reach you, we will leave a message.  However, if you are feeling well and you are not experiencing any problems, there is no need to return our call.  We will assume that you have returned to your regular daily activities without incident.  If any biopsies were taken you will be contacted by phone or by letter within the next 1-3 weeks.  Please call us at [phone] if you have not heard about the biopsies in 3 weeks.    SIGNATURES/CONFIDENTIALITY: You and/or your care partner have signed paperwork which will be entered into your electronic medical record.  These signatures attest to the fact that that the information above on your After Visit Summary has been reviewed and is understood.  Full responsibility of the confidentiality of this discharge information lies with you and/or your care-partner.   Resume medications. Information given on polyps and hiatal hernia.

## 2016-11-30 NOTE — Op Note (Signed)
[facility] Patient Name: Teresa [name] Procedure Date: 11/30/2016 2:11 PM MRN: [id number] Endoscopist: Jerene Bears , MD Age: 56 Referring MD:  Date of Birth: [birth year] Gender: Female Account #: [id number] Procedure:                Colonoscopy Indications:              Last colonoscopy: 2014 ([place], 1 polyp                            removed from sigmoid), Family history of colon                            cancer in a first-degree relative, Incidental                            change in bowel habits noted, Incidental                            constipation noted Medicines:                Monitored Anesthesia Care Procedure:                Pre-Anesthesia Assessment:                           - Prior to the procedure, a History and Physical                            was performed, and patient medications and                            allergies were reviewed. The patient's tolerance of                            previous anesthesia was also reviewed. The risks                            and benefits of the procedure and the sedation                            options and risks were discussed with the patient.                            All questions were answered, and informed consent                            was obtained. Prior Anticoagulants: The patient has                            taken no previous anticoagulant or antiplatelet                            agents. ASA Grade Assessment: II - A patient with  mild systemic disease. After reviewing the risks                            and benefits, the patient was deemed in                            satisfactory condition to undergo the procedure.                           After obtaining informed consent, the colonoscope                            was passed under direct vision. Throughout the                            procedure, the patient's blood pressure, pulse, and                             oxygen saturations were monitored continuously. The                            Colonoscope was introduced through the anus and                            advanced to the the cecum, identified by                            appendiceal orifice and ileocecal valve. The                            colonoscopy was performed without difficulty. The                            patient tolerated the procedure well. The quality                            of the bowel preparation was good. The ileocecal                            valve, appendiceal orifice, and rectum were                            photographed. Scope In: 2:27:48 PM Scope Out: 2:39:09 PM Scope Withdrawal Time: 0 hours 9 minutes 58 seconds  Total Procedure Duration: 0 hours 11 minutes 21 seconds  Findings:                 The digital rectal exam was normal.                           A 4 mm polyp was found in the cecum. The polyp was                            sessile. The polyp was removed with a cold snare.  Resection and retrieval were complete.                           A 4 mm polyp was found in the ascending colon. The                            polyp was sessile. The polyp was removed with a                            cold snare. Resection and retrieval were complete.                           A 5 mm polyp was found in the sigmoid colon. The                            polyp was sessile. The polyp was removed with a                            cold snare. Resection and retrieval were complete.                           Multiple small and large-mouthed diverticula were                            found in the sigmoid colon and descending colon.                           The retroflexed view of the distal rectum and anal                            verge was normal and showed no anal or rectal                            abnormalities. Complications:            No immediate  complications. Estimated Blood Loss:     Estimated blood loss was minimal. Impression:               - One 4 mm polyp in the cecum, removed with a cold                            snare. Resected and retrieved.                           - One 4 mm polyp in the ascending colon, removed                            with a cold snare. Resected and retrieved.                           - One 5 mm polyp in the sigmoid colon, removed with  a cold snare. Resected and retrieved.                           - Mild diverticulosis in the sigmoid colon and in                            the descending colon. Recommendation:           - Patient has a contact number available for                            emergencies. The signs and symptoms of potential                            delayed complications were discussed with the                            patient. Return to normal activities tomorrow.                            Written discharge instructions were provided to the                            patient.                           - Resume previous diet.                           - Continue present medications.                           - MiraLax 17 g twice daily.                           - Await pathology results.                           - Repeat colonoscopy is recommended. The                            colonoscopy date will be determined after pathology                            results from today's exam become available for                            review.                           - Office follow-up next available to ensure                            improvement. Jerene Bears, MD 11/30/2016 2:51:47 PM This report has been signed electronically.

## 2016-11-30 NOTE — Progress Notes (Signed)
Called to room to assist during endoscopic procedure.  Patient ID and intended procedure confirmed with present staff. Received instructions for my participation in the procedure from the performing physician.  

## 2016-11-30 NOTE — Op Note (Signed)
[facility] Patient Name: Teresa [name] Procedure Date: 11/30/2016 2:11 PM MRN: [id number] Endoscopist: Jerene Bears , MD Age: 56 Referring MD:  Date of Birth: [birth year] Gender: Female Account #: [id number] Procedure:                Upper GI endoscopy Indications:              Epigastric abdominal pain, Dysphagia,                            Gastro-esophageal reflux disease Medicines:                Monitored Anesthesia Care Procedure:                Pre-Anesthesia Assessment:                           - Prior to the procedure, a History and Physical                            was performed, and patient medications and                            allergies were reviewed. The patient's tolerance of                            previous anesthesia was also reviewed. The risks                            and benefits of the procedure and the sedation                            options and risks were discussed with the patient.                            All questions were answered, and informed consent                            was obtained. Prior Anticoagulants: The patient has                            taken no previous anticoagulant or antiplatelet                            agents. ASA Grade Assessment: II - A patient with                            mild systemic disease. After reviewing the risks                            and benefits, the patient was deemed in                            satisfactory condition to undergo the procedure.  After obtaining informed consent, the endoscope was                            passed under direct vision. Throughout the                            procedure, the patient's blood pressure, pulse, and                            oxygen saturations were monitored continuously. The                            Endoscope was introduced through the mouth, and                            advanced to the second part of  duodenum. The upper                            GI endoscopy was accomplished without difficulty.                            The patient tolerated the procedure well. Scope In: Scope Out: Findings:                 The examined esophagus was normal. No evidence of                            esophagitis or stricture. GE junction widely patent                            at 39 cm.                           A 1 cm hiatal hernia was present.                           Mild inflammation characterized by erythema was                            found in the cardia, in the gastric fundus and in                            the gastric body. Biopsies were taken with a cold                            forceps for histology and Helicobacter pylori                            testing (antrum, body, incisura).                           The examined duodenum was normal. Complications:            No immediate complications. Estimated Blood Loss:     Estimated blood loss was minimal. Impression:               -  Normal esophagus.                           - 1 cm hiatal hernia.                           - Gastritis. Biopsied.                           - Normal examined duodenum. Recommendation:           - Patient has a contact number available for                            emergencies. The signs and symptoms of potential                            delayed complications were discussed with the                            patient. Return to normal activities tomorrow.                            Written discharge instructions were provided to the                            patient.                           - Resume previous diet.                           - Continue present medications including                            pantoprazole 40 mg daily.                           - Await pathology results.                           - If pathology results unremarkable and upper                            abdominal pain  continues perform abdominal US. Jerene Bears, MD 11/30/2016 2:46:36 PM This report has been signed electronically.

## 2016-11-30 NOTE — Progress Notes (Signed)
Charted on wrong pt.

## 2016-11-30 NOTE — Progress Notes (Signed)
Report to PACU, RN, vss, BBS= Clear.  

## 2016-12-01 ENCOUNTER — Telehealth: Payer: Self-pay | Admitting: *Deleted

## 2016-12-01 ENCOUNTER — Telehealth: Payer: Self-pay

## 2016-12-01 ENCOUNTER — Telehealth: Payer: Self-pay | Admitting: Internal Medicine

## 2016-12-01 NOTE — Telephone Encounter (Signed)
No answer, message left for the patient. 

## 2016-12-01 NOTE — Telephone Encounter (Signed)
Spoke with pt and discussed with her that her sore throat should get better and pt was just now eating lunch. Explained that once she ate she should feel better and not light headed. Pt knows to call back if throat does not get better.

## 2016-12-01 NOTE — Telephone Encounter (Signed)
  Follow up Call-  Call back number 11/30/2016  Post procedure Call Back phone  # [phone]  Permission to leave phone message Yes  Some recent data might be hidden     Patient questions:  Do you have a fever, pain , or abdominal swelling? No. Pain Score  0 *  Have you tolerated food without any problems? Yes.    Have you been able to return to your normal activities? Yes.    Do you have any questions about your discharge instructions: Diet   No. Medications  No. Follow up visit  No.  Do you have questions or concerns about your Care? No.  Actions: * If pain score is 4 or above: No action needed, pain <4.

## 2016-12-03 ENCOUNTER — Emergency Department (HOSPITAL_COMMUNITY): Payer: BLUE CROSS/BLUE SHIELD

## 2016-12-03 ENCOUNTER — Emergency Department (HOSPITAL_COMMUNITY)
Admission: EM | Admit: 2016-12-03 | Discharge: 2016-12-03 | Disposition: A | Discharge status: Home / Self Care | Payer: BLUE CROSS/BLUE SHIELD | Attending: Emergency Medicine | Admitting: Emergency Medicine

## 2016-12-03 ENCOUNTER — Encounter (HOSPITAL_COMMUNITY): Payer: Self-pay | Admitting: Emergency Medicine

## 2016-12-03 DIAGNOSIS — I10 Essential (primary) hypertension: Secondary | ICD-10-CM | POA: Insufficient documentation

## 2016-12-03 DIAGNOSIS — K2970 Gastritis, unspecified, without bleeding: Secondary | ICD-10-CM

## 2016-12-03 DIAGNOSIS — Z79899 Other long term (current) drug therapy: Secondary | ICD-10-CM | POA: Diagnosis not present

## 2016-12-03 DIAGNOSIS — F17210 Nicotine dependence, cigarettes, uncomplicated: Secondary | ICD-10-CM | POA: Diagnosis not present

## 2016-12-03 DIAGNOSIS — R1013 Epigastric pain: Secondary | ICD-10-CM | POA: Diagnosis present

## 2016-12-03 DIAGNOSIS — Z85038 Personal history of other malignant neoplasm of large intestine: Secondary | ICD-10-CM | POA: Diagnosis not present

## 2016-12-03 DIAGNOSIS — E039 Hypothyroidism, unspecified: Secondary | ICD-10-CM | POA: Insufficient documentation

## 2016-12-03 DIAGNOSIS — R9431 Abnormal electrocardiogram [ECG] [EKG]: Secondary | ICD-10-CM

## 2016-12-03 DIAGNOSIS — Z91040 Latex allergy status: Secondary | ICD-10-CM | POA: Insufficient documentation

## 2016-12-03 LAB — URINALYSIS, ROUTINE W REFLEX MICROSCOPIC
Bilirubin Urine: NEGATIVE
Glucose, UA: NEGATIVE mg/dL
Hgb urine dipstick: NEGATIVE
Ketones, ur: NEGATIVE mg/dL
Leukocytes, UA: NEGATIVE
Nitrite: NEGATIVE
Protein, ur: NEGATIVE mg/dL
Specific Gravity, Urine: 1.018 (ref 1.005–1.030)
pH: 5 (ref 5.0–8.0)

## 2016-12-03 LAB — COMPREHENSIVE METABOLIC PANEL
ALT: 25 U/L (ref 14–54)
AST: 20 U/L (ref 15–41)
Albumin: 4 g/dL (ref 3.5–5.0)
Alkaline Phosphatase: 79 U/L (ref 38–126)
Anion gap: 8 (ref 5–15)
BUN: 9 mg/dL (ref 6–20)
CO2: 25 mmol/L (ref 22–32)
Calcium: 9.7 mg/dL (ref 8.9–10.3)
Chloride: 103 mmol/L (ref 101–111)
Creatinine, Ser: 0.88 mg/dL (ref 0.44–1.00)
GFR calc Af Amer: >60 mL/min (ref >60)
GFR calc non Af Amer: >60 mL/min (ref >60)
Glucose, Bld: 140 mg/dL — ABNORMAL HIGH (ref 65–99)
Potassium: 3.3 mmol/L — ABNORMAL LOW (ref 3.5–5.1)
Sodium: 136 mmol/L (ref 135–145)
Total Bilirubin: 0.6 mg/dL (ref 0.3–1.2)
Total Protein: 7.1 g/dL (ref 6.5–8.1)

## 2016-12-03 LAB — CBC
HCT: 41.4 % (ref 36.0–46.0)
Hemoglobin: 13.7 g/dL (ref 12.0–15.0)
MCH: 27.1 pg (ref 26.0–34.0)
MCHC: 33.1 g/dL (ref 30.0–36.0)
MCV: 82 fL (ref 78.0–100.0)
Platelets: 304 10*3/uL (ref 150–400)
RBC: 5.05 MIL/uL (ref 3.87–5.11)
RDW: 13.9 % (ref 11.5–15.5)
WBC: 7.6 10*3/uL (ref 4.0–10.5)

## 2016-12-03 LAB — I-STAT TROPONIN, ED: Troponin i, poc: 0.01 ng/mL (ref 0.00–0.08)

## 2016-12-03 LAB — LIPASE, BLOOD: Lipase: 27 U/L (ref 11–51)

## 2016-12-03 MED ORDER — SODIUM CHLORIDE 0.9 % IV SOLN
80.0000 mg | Freq: Once | INTRAVENOUS | Status: AC
  Administered 2016-12-03: 80 mg via INTRAVENOUS
  Filled 2016-12-03: qty 80

## 2016-12-03 MED ORDER — GI COCKTAIL ~~LOC~~
30.0000 mL | Freq: Once | ORAL | Status: AC
  Administered 2016-12-03: 30 mL via ORAL
  Filled 2016-12-03: qty 30

## 2016-12-03 MED ORDER — PANTOPRAZOLE SODIUM 40 MG PO TBEC: 40.0000 mg | DELAYED_RELEASE_TABLET | Freq: Every day | ORAL | 11 refills | Status: DC

## 2016-12-03 NOTE — ED Provider Notes (Signed)
[facility] DEPT Provider Note   CSN: [id number] Arrival date & time: 12/03/16  1354     History   Chief Complaint Chief Complaint  Patient presents with  . Abdominal Pain    HPI Teresa [name] is a 56 y.o. female.  HPI  56 year old female who has been having recent evaluation for abdominal pain status post upper and lower endoscopy 3 days ago. States epigastric to right upper quadrant pain. She states the planned next step in her workup was her gallbladder but has not been evaluated yet. She has had increased pain with by mouth intake. She has not been vomiting but has had nausea. She states that she lost some weight in the spring when she fasted. However she has not had continued weight loss. She denies any emesis or bright red blood or blood in stool. Colonoscopy results revealed patient had a 4 mm polyp in the cecum and a 5 mm polyp in the sigmoid colon. Upper GI exam revealed normal esophagus with a 1 cm hiatal hernia present. Inflammation was noted in the stomach. Helicobacter pylori was sent  Past Medical History:  Diagnosis Date  . Allergy   . Anemia   . Cervical disc disorder with radiculopathy of cervical region 02/10/2016  . Chronic lower back pain   . GERD (gastroesophageal reflux disease)   . H/O hiatal hernia   . History of amputation of lesser toe of left foot ([place]) 03/11/2015  . History of colon cancer   . Hypertension   . Hyperthyroidism    hx  . Hypothyroidism   . Migraine headache    "a few times/yr" (03/05/2015)  . Neck strain 02/06/2012  . Osteomyelitis ([place])    [name] 03/05/2015  . Osteomyelitis of toe of left foot ([place]) 03/05/2015  . Palpitations 01/05/2016  . Positive TB test     Patient Active Problem List   Diagnosis Date Noted  . Constipation 11/04/2016  . Left ankle pain 10/11/2016  . Pain of right heel 10/11/2016  . Cerumen impaction 09/27/2016  . Prediabetes 10/29/2015  . Tendinopathy of left rotator cuff 10/27/2015  . Essential hypertension     . Obesity 04/15/2014  . Lateral knee pain, left 08/06/2013  . Allergic rhinitis 07/19/2013  . Thyroid nodule 02/12/2013  . GERD (gastroesophageal reflux disease) 02/06/2012  . Tobacco abuse 09/21/2011  . Hypothyroidism 09/21/2011  . Throat pain 09/21/2011    Past Surgical History:  Procedure Laterality Date  . ABDOMINAL HYSTERECTOMY  2006  . AMPUTATION TOE Left 03/05/2015   Procedure: AMPUTATION LEFT 5TH  TOE;  Surgeon: Wylene Simmer, MD;  Location: [facility];  Service: Orthopedics;  Laterality: Left;  . APPENDECTOMY  2006  . BREAST BIOPSY Left 11/26/2014  . [place]  . COLONOSCOPY    . TOE AMPUTATION Left 03/05/2015   5th toe  . TUBAL LIGATION Bilateral 1990  . UPPER GASTROINTESTINAL ENDOSCOPY      OB History    No data available       Home Medications    Prior to Admission medications   Medication Sig Start Date End Date Taking? Authorizing Provider  amLODipine (NORVASC) 10 MG tablet Take 1 tablet (10 mg total) by mouth daily. 09/27/16   Kinnie Feil, MD  carvedilol (COREG) 6.25 MG tablet Take 1 tablet (6.25 mg total) by mouth 2 (two) times daily with a meal. 09/27/16 12/26/16  Kinnie Feil, MD  Cyanocobalamin (B-12 PO) Take 1 tablet by mouth daily.  [provider]  cycloSPORINE (RESTASIS) 0.05 % ophthalmic emulsion Place 1 drop into both eyes 2 (two) times daily.     [provider]  Hypromellose (ARTIFICIAL TEARS OP) Place 1 drop into both eyes as needed (for dry eyes). Reported on 08/13/2015    [provider]  levothyroxine (SYNTHROID, LEVOTHROID) 112 MCG tablet TAKE ONE TABLET BY MOUTH ONCE DAILY BEFORE BREAKFAST 11/30/16   Eniola, Tawanna Solo T, MD  mometasone (NASONEX) 50 MCG/ACT nasal spray Place 2 sprays into the nose daily. 09/27/16   Kinnie Feil, MD  naproxen (NAPROSYN) 500 MG tablet Take 1 tablet (500 mg total) by mouth 2 (two) times daily with a meal. Patient not taking: Reported on 11/30/2016 10/12/16   Kinnie Feil, MD  pantoprazole (PROTONIX) 40 MG tablet Take 1 tablet (40 mg total) by mouth daily. 11/28/16   Esterwood, Amy S, PA-C  polyethylene glycol powder (GLYCOLAX/MIRALAX) powder Take 17 g by mouth 2 (two) times daily. Patient taking differently: Take 17 g by mouth every other day.  11/04/16   McKeag, Marylynn Pearson, MD  spironolactone (ALDACTONE) 25 MG tablet TAKE ONE TABLET BY MOUTH ONCE DAILY 09/27/16   Kinnie Feil, MD    Family History Family History  Problem Relation Age of Onset  . Heart disease Mother        CAB age 68  . Diabetes Mother   . Cancer Father 59       colon cancer  . Stroke Father   . Colon cancer Father   . Diabetes Sister   . Kidney disease Sister        renal failure  . Diabetes Brother   . Kidney disease Brother        renal failure  . Heart disease Other   . Diabetes Other   . Breast cancer Maternal Aunt        per pt aunts and uncles died of cancer not sure the type  . Breast cancer Paternal Aunt   . Esophageal cancer Neg Hx   . Rectal cancer Neg Hx   . Stomach cancer Neg Hx     Social History Social History  Substance Use Topics  . Smoking status: Current Every Day Smoker    Packs/day: 0.50    Years: 36.00    Types: Cigarettes  . Smokeless tobacco: Never Used  . Alcohol use No     Allergies   Bactrim [sulfamethoxazole-trimethoprim]; Clonidine derivatives; Aspirin; and Latex   Review of Systems Review of Systems  All other systems reviewed and are negative.    Physical Exam Updated Vital Signs Pulse 68   Temp 97.7 F (36.5 C) (Oral)   Resp 17   Ht 1.651 m (5\' 5" )   Wt 86.2 kg (190 lb)   SpO2 100%   BMI 31.62 kg/m   Physical Exam  Constitutional: She is oriented to person, place, and time. She appears well-developed and well-nourished. No distress.  HENT:  Head: Normocephalic and atraumatic.  Right Ear: External ear normal.  Left Ear: External ear normal.  Nose: Nose normal.  Eyes: Conjunctivae and EOM are normal. Pupils  are equal, round, and reactive to light.  Neck: Normal range of motion. Neck supple.  Pulmonary/Chest: Effort normal.  Abdominal: Soft. Bowel sounds are normal. There is tenderness.  Epigastric and right upper quadrant tenderness palpation. These reproduce the patient's symptoms.  Musculoskeletal: Normal range of motion.  Neurological: She is alert and oriented to person, place, and time. She  exhibits normal muscle tone. Coordination normal.  Skin: Skin is warm and dry.  Psychiatric: She has a normal mood and affect. Her behavior is normal. Thought content normal.  Nursing note and vitals reviewed.    ED Treatments / Results  Labs (all labs ordered are listed, but only abnormal results are displayed) Labs Reviewed  COMPREHENSIVE METABOLIC PANEL - Abnormal; Notable for the following:       Result Value   Potassium 3.3 (*)    Glucose, Bld 140 (*)    All other components within normal limits  URINALYSIS, ROUTINE W REFLEX MICROSCOPIC - Abnormal; Notable for the following:    APPearance HAZY (*)    All other components within normal limits  LIPASE, BLOOD  CBC  I-STAT TROPOININ, ED    EKG  EKG Interpretation  Date/Time:  Saturday December 03 2016 14:11:32 EDT Ventricular Rate:  68 PR Interval:  160 QRS Duration: 86 QT Interval:  388 QTC Calculation: 412 R Axis:   23 Text Interpretation:  Normal sinus rhythm Possible Left atrial enlargement ST & T wave abnormality, consider inferolateral ischemia Abnormal ECG Confirmed by Jc Veron MD, Andee Poles [phone]) on 12/03/2016 6:41:01 PM       Radiology US Abdomen Complete  Result Date: 12/03/2016 CLINICAL DATA:  56 year old female with upper abdominal pain for 3 weeks. EXAM: ABDOMEN ULTRASOUND COMPLETE COMPARISON:  02/10/2010 ultrasound. FINDINGS: Gallbladder: The gallbladder is unremarkable. There is no evidence of cholelithiasis or acute cholecystitis. Common bile duct: Diameter: 2 mm. There is no evidence of intrahepatic or extrahepatic biliary  dilatation. Liver: A 2.5 cm homogeneously hyperechoic mass within the left liver is unchanged from 2011 and compatible with a hemangioma. No other hepatic abnormalities are identified. IVC: No abnormality visualized. Pancreas: Visualized portion unremarkable. Spleen: Size and appearance within normal limits. Right Kidney: Length: 10.9 cm. Echogenicity within normal limits. No mass or hydronephrosis visualized. A 1.9 cm lower pole cyst is present. Left Kidney: Length: 11.6 cm. Echogenicity within normal limits. No mass or hydronephrosis visualized. Abdominal aorta: No aneurysm visualized. Other findings: None. IMPRESSION: No acute or significant abnormality. Electronically Signed   By: Margarette Canada M.D.   On: 12/03/2016 18:14    Procedures Procedures (including critical care time)  Medications Ordered in ED Medications  pantoprazole (PROTONIX) 80 mg in sodium chloride 0.9 % 100 mL IVPB (0 mg Intravenous Stopped 12/03/16 1900)  gi cocktail (Maalox,Lidocaine,Donnatal) (30 mLs Oral Given 12/03/16 1733)     Initial Impression / Assessment and Plan / ED Course  I have reviewed the triage vital signs and the nursing notes.  Pertinent labs & imaging results that were available during my care of the patient were reviewed by me and considered in my medical decision making (see chart for details).     Right upper quadrant ultrasound obtained here shows no evidence of acute cholecystitis or gallstones. Labs are normal. EKG shows some lateral ST depression and T-wave inversion. This was present on previous EKG but is more pronounced today. Patient has no complaints of chest pain and her abdominal pain component is reproduced with physical exam. Troponin was checked here and is not elevated. His have been ongoing for the past month. States patient started on Protonix here. She is given GI cocktail with some improvement. I think likely her exam with her gastritis. She is informed of the abnormality on her EKG and  the need for follow-up on return if she has any symptoms consistent with coronary artery disease. Protonix as listed on patient's medications  that she states that she does not have this has not been taking it I will restart Final Clinical Impressions(s) / ED Diagnoses   Final diagnoses:  Gastritis without bleeding, unspecified chronicity, unspecified gastritis type  Abnormal EKG    New Prescriptions New Prescriptions   No medications on file     Pattricia Boss, MD 12/03/16 2034

## 2016-12-03 NOTE — Discharge Instructions (Signed)
Please continue your home protonix.  Recheck with your gastroenterologist asap. Follow up with cardiologist regarding your abnormal ekg.

## 2016-12-03 NOTE — ED Notes (Signed)
Pt ambulated to bathroom with no complaints

## 2016-12-03 NOTE — ED Triage Notes (Signed)
Pt c/o right upper abdominal pain that radiates around to her back with nausea ongoing for few months. Pt has been seen by PMD for same and had colonoscopy and endoscopy done. Pain increases when she eats.

## 2016-12-03 NOTE — ED Notes (Signed)
Pt left before discharge paperwork completed.

## 2016-12-06 ENCOUNTER — Ambulatory Visit: Payer: BLUE CROSS/BLUE SHIELD | Admitting: Family Medicine

## 2016-12-08 ENCOUNTER — Encounter: Payer: Self-pay | Admitting: Internal Medicine

## 2016-12-24 ENCOUNTER — Emergency Department (HOSPITAL_COMMUNITY)
Admission: EM | Admit: 2016-12-24 | Discharge: 2016-12-24 | Disposition: A | Discharge status: Home / Self Care | Payer: BLUE CROSS/BLUE SHIELD | Attending: Emergency Medicine | Admitting: Emergency Medicine

## 2016-12-24 ENCOUNTER — Encounter (HOSPITAL_COMMUNITY): Payer: Self-pay | Admitting: *Deleted

## 2016-12-24 DIAGNOSIS — H9201 Otalgia, right ear: Secondary | ICD-10-CM

## 2016-12-24 DIAGNOSIS — I10 Essential (primary) hypertension: Secondary | ICD-10-CM | POA: Diagnosis not present

## 2016-12-24 DIAGNOSIS — R51 Headache: Secondary | ICD-10-CM | POA: Insufficient documentation

## 2016-12-24 DIAGNOSIS — E039 Hypothyroidism, unspecified: Secondary | ICD-10-CM | POA: Insufficient documentation

## 2016-12-24 DIAGNOSIS — D649 Anemia, unspecified: Secondary | ICD-10-CM | POA: Diagnosis not present

## 2016-12-24 DIAGNOSIS — Z91040 Latex allergy status: Secondary | ICD-10-CM | POA: Insufficient documentation

## 2016-12-24 DIAGNOSIS — F17210 Nicotine dependence, cigarettes, uncomplicated: Secondary | ICD-10-CM | POA: Diagnosis not present

## 2016-12-24 DIAGNOSIS — R519 Headache, unspecified: Secondary | ICD-10-CM

## 2016-12-24 DIAGNOSIS — Z79899 Other long term (current) drug therapy: Secondary | ICD-10-CM | POA: Diagnosis not present

## 2016-12-24 MED ORDER — OXYCODONE-ACETAMINOPHEN 5-325 MG PO TABS
1.0000 | ORAL_TABLET | Freq: Once | ORAL | Status: AC
  Administered 2016-12-24: 1 via ORAL
  Filled 2016-12-24: qty 1

## 2016-12-24 MED ORDER — OXYCODONE-ACETAMINOPHEN 5-325 MG PO TABS: 1.0000 | ORAL_TABLET | Freq: Four times a day (QID) | ORAL | 0 refills | Status: DC | PRN

## 2016-12-24 MED ORDER — AMOXICILLIN 500 MG PO CAPS: 500.0000 mg | ORAL_CAPSULE | Freq: Three times a day (TID) | ORAL | 0 refills | Status: DC

## 2016-12-24 NOTE — Discharge Instructions (Signed)
°  As we discussed, you may be experiencing some nerve pain. Follow-up with her primary care doctor as soon as possible for reevaluation.  Take the pain medications as directed.   Return the emergency Department for any worsening pain, fever, headache, vision changes, numbness/weakness, difficulty walking or any other worsening or concerning symptoms.

## 2016-12-24 NOTE — ED Triage Notes (Signed)
Pt reports onset yesterday of severe left ear pain, causing a headache.

## 2016-12-24 NOTE — ED Provider Notes (Signed)
[facility] DEPT Provider Note   CSN: [id number] Arrival date & time: 12/24/16  1110  By signing my name below, I, Teresa [name], attest that this documentation has been prepared under the direction and in the presence of non-physician practitioner, Volanda Napoleon., PA-C. Electronically Signed: Theresia [name], ED Scribe. 12/24/16. 1:42 PM.  History   Chief Complaint Chief Complaint  Patient presents with  . Otalgia   The history is provided by the patient. No language interpreter was used.   HPI Comments: Teresa [name] is a 56 y.o. female with a PMHx of HTN and a Thyroid problem, who presents to the Emergency Department complaining of left ear pain that began last night. Pt describes pain as stabbing and shooting in and around her left ear. She reports that she does have some left sided facial pain right at the ear but denies any radiation. She denies any triggering factor and states that episodes of pain are intermittent. She denies any pain exacerbated by eating or moving her face. Pt reports that this morning she began experiencing a gradually worsening headache that is behind her eyes and radiates generally. No history of trauma, injury or fall. She has not tried any medications. Pt is eating and drinking fluids normally. . No recent swimming. Denies fevers, rash, vision changes. No other complaints at this time.  Past Medical History:  Diagnosis Date  . Allergy   . Anemia   . Cervical disc disorder with radiculopathy of cervical region 02/10/2016  . Chronic lower back pain   . GERD (gastroesophageal reflux disease)   . H/O hiatal hernia   . History of amputation of lesser toe of left foot ([place]) 03/11/2015  . History of colon cancer   . Hypertension   . Hyperthyroidism    hx  . Hypothyroidism   . Migraine headache    "a few times/yr" (03/05/2015)  . Neck strain 02/06/2012  . Osteomyelitis ([place])    [name] 03/05/2015  . Osteomyelitis of toe of left foot ([place]) 03/05/2015  .  Palpitations 01/05/2016  . Positive TB test     Patient Active Problem List   Diagnosis Date Noted  . Constipation 11/04/2016  . Left ankle pain 10/11/2016  . Pain of right heel 10/11/2016  . Cerumen impaction 09/27/2016  . Prediabetes 10/29/2015  . Tendinopathy of left rotator cuff 10/27/2015  . Essential hypertension   . Obesity 04/15/2014  . Lateral knee pain, left 08/06/2013  . Allergic rhinitis 07/19/2013  . Thyroid nodule 02/12/2013  . GERD (gastroesophageal reflux disease) 02/06/2012  . Tobacco abuse 09/21/2011  . Hypothyroidism 09/21/2011  . Throat pain 09/21/2011    Past Surgical History:  Procedure Laterality Date  . ABDOMINAL HYSTERECTOMY  2006  . AMPUTATION TOE Left 03/05/2015   Procedure: AMPUTATION LEFT 5TH  TOE;  Surgeon: Wylene Simmer, MD;  Location: [facility];  Service: Orthopedics;  Laterality: Left;  . APPENDECTOMY  2006  . BREAST BIOPSY Left 11/26/2014  . [place]  . COLONOSCOPY    . TOE AMPUTATION Left 03/05/2015   5th toe  . TUBAL LIGATION Bilateral 1990  . UPPER GASTROINTESTINAL ENDOSCOPY      OB History    No data available       Home Medications    Prior to Admission medications   Medication Sig Start Date End Date Taking? Authorizing Provider  amLODipine (NORVASC) 10 MG tablet Take 1 tablet (10 mg total) by mouth daily. 09/27/16   Kinnie Feil, MD  amoxicillin (AMOXIL) 500 MG capsule Take 1 capsule (500 mg total) by mouth 3 (three) times daily. 12/24/16   Volanda Napoleon, PA-C  carvedilol (COREG) 6.25 MG tablet Take 1 tablet (6.25 mg total) by mouth 2 (two) times daily with a meal. 09/27/16 12/26/16  Kinnie Feil, MD  Cyanocobalamin (B-12 PO) Take 1 tablet by mouth daily.     [provider]  cycloSPORINE (RESTASIS) 0.05 % ophthalmic emulsion Place 1 drop into both eyes 2 (two) times daily.     [provider]  Hypromellose (ARTIFICIAL TEARS OP) Place 1 drop into both eyes as needed (for dry eyes).  Reported on 08/13/2015    [provider]  levothyroxine (SYNTHROID, LEVOTHROID) 112 MCG tablet TAKE ONE TABLET BY MOUTH ONCE DAILY BEFORE BREAKFAST 11/30/16   Eniola, Tawanna Solo T, MD  mometasone (NASONEX) 50 MCG/ACT nasal spray Place 2 sprays into the nose daily. 09/27/16   Kinnie Feil, MD  naproxen (NAPROSYN) 500 MG tablet Take 1 tablet (500 mg total) by mouth 2 (two) times daily with a meal. Patient not taking: Reported on 11/30/2016 10/12/16   Kinnie Feil, MD  oxyCODONE-acetaminophen (PERCOCET/ROXICET) 5-325 MG tablet Take 1-2 tablets by mouth every 6 (six) hours as needed for severe pain. 12/24/16   Volanda Napoleon, PA-C  pantoprazole (PROTONIX) 40 MG tablet Take 1 tablet (40 mg total) by mouth daily. 12/03/16   Pattricia Boss, MD  polyethylene glycol powder (GLYCOLAX/MIRALAX) powder Take 17 g by mouth 2 (two) times daily. Patient taking differently: Take 17 g by mouth every other day.  11/04/16   McKeag, Marylynn Pearson, MD  spironolactone (ALDACTONE) 25 MG tablet TAKE ONE TABLET BY MOUTH ONCE DAILY 09/27/16   Kinnie Feil, MD    Family History Family History  Problem Relation Age of Onset  . Heart disease Mother        CAB age 101  . Diabetes Mother   . Cancer Father 50       colon cancer  . Stroke Father   . Colon cancer Father   . Diabetes Sister   . Kidney disease Sister        renal failure  . Diabetes Brother   . Kidney disease Brother        renal failure  . Heart disease Other   . Diabetes Other   . Breast cancer Maternal Aunt        per pt aunts and uncles died of cancer not sure the type  . Breast cancer Paternal Aunt   . Esophageal cancer Neg Hx   . Rectal cancer Neg Hx   . Stomach cancer Neg Hx     Social History Social History  Substance Use Topics  . Smoking status: Current Every Day Smoker    Packs/day: 0.50    Years: 36.00    Types: Cigarettes  . Smokeless tobacco: Never Used  . Alcohol use No     Allergies   Bactrim  [sulfamethoxazole-trimethoprim]; Clonidine derivatives; Aspirin; and Latex   Review of Systems Review of Systems  HENT: Positive for ear pain. Negative for facial swelling.   Eyes: Negative for visual disturbance.  Skin: Negative for rash.  Neurological: Positive for headaches.     Physical Exam Updated Vital Signs BP (!) 173/84 (BP Location: Right Arm)   Pulse 60   Temp 98.3 F (36.8 C) (Oral)   Resp 16   SpO2 99%   Physical Exam  Constitutional: She appears well-developed and well-nourished.  Sitting comfortably int he chair but then intermittently will jump out of the chair and yell in pain   HENT:  Head: Normocephalic and atraumatic.  Mouth/Throat: Uvula is midline, oropharynx is clear and moist and mucous membranes are normal. No posterior oropharyngeal edema, posterior oropharyngeal erythema or tonsillar abscesses.  No tenderness to palpation of the scalp. No open wounds, abrasions or lacerations. No deformity or crepitus. Right ear canal is ceruminous. Left TM is erythematous. No pain with movement of the tragus. Patient is able to tolerate the exam without difficulty. No mastoid process tenderness or erythema bilaterally. No facial or neck swelling. No trismus. Pain is not triggered with elevation/depression or lateral movement of the mandible.   Eyes: Conjunctivae and EOM are normal. Right eye exhibits no discharge. Left eye exhibits no discharge. No scleral icterus.  Pulmonary/Chest: Effort normal.  Neurological: She is alert.  Skin: Skin is warm and dry. No rash noted.  No facial rash  Psychiatric: She has a normal mood and affect. Her speech is normal and behavior is normal.  Nursing note and vitals reviewed.    ED Treatments / Results  DIAGNOSTIC STUDIES: Oxygen Saturation is 97% on RA, normal by my interpretation.   COORDINATION OF CARE: 12:42 PM-Discussed next steps with pt including speaking with Dr. Laverta Baltimore about her symptoms. Pt verbalized understanding and  is agreeable with the plan.   1:53 PM - Re-evaluated after pain medication. She notes that her pain is still there but much improved.    Labs (all labs ordered are listed, but only abnormal results are displayed) Labs Reviewed - No data to display  EKG  EKG Interpretation None       Radiology No results found.  Procedures Procedures (including critical care time)  Medications Ordered in ED Medications  oxyCODONE-acetaminophen (PERCOCET/ROXICET) 5-325 MG per tablet 1 tablet (1 tablet Oral Given 12/24/16 1303)     Initial Impression / Assessment and Plan / ED Course  I have reviewed the triage vital signs and the nursing notes.  Pertinent labs & imaging results that were available during my care of the patient were reviewed by me and considered in my medical decision making (see chart for details).     56 year old female who presents with left ear pain that began last night. Followed by headache. Patient is afebrile, non-toxic appearing, sitting comfortably on examination table. Signs reviewed. Patient is hypertensive likely secondary to pain. History/physical exam are not concerning for mastoiditis, shingles or cellulitis. Consider AOM versus trigeminal neuralgia vs muscular pain. Will give analgesics in the emergency department.  Patient reports improvement in pain after pain medication. Given that patient is having ear pain combined with erythematous TM, will plan to treat for AOM. We had a thorough discussion regarding the risks/benefits of starting her on neuropathic pain medication. Given that I have no follow-up with patient will not plan to start her on anything. Patient agrees to plan. Instruct patient follow-up with her primary care doctor on Monday for further evaluation. Will plan to provide her short course of pain medications until she can see her primary care doctor. Strict return precautions discussed. Patient's breasts understanding and agreement to plan.     Final Clinical Impressions(s) / ED Diagnoses   Final diagnoses:  Right ear pain  Facial pain  Nonintractable headache, unspecified chronicity pattern, unspecified headache type    New Prescriptions Discharge Medication List as of 12/24/2016  1:58 PM    START taking these medications   Details  amoxicillin (AMOXIL)  500 MG capsule Take 1 capsule (500 mg total) by mouth 3 (three) times daily., Starting Sat 12/24/2016, Print    oxyCODONE-acetaminophen (PERCOCET/ROXICET) 5-325 MG tablet Take 1-2 tablets by mouth every 6 (six) hours as needed for severe pain., Starting Sat 12/24/2016, Print       I personally performed the services described in this documentation, which was scribed in my presence. The recorded information has been reviewed and is accurate.     Volanda Napoleon, PA-C 12/27/16 0002    Margette Fast, MD 12/27/16 [phone]

## 2016-12-29 ENCOUNTER — Encounter: Payer: Self-pay | Admitting: Family Medicine

## 2016-12-30 ENCOUNTER — Ambulatory Visit: Payer: BLUE CROSS/BLUE SHIELD | Admitting: Family Medicine

## 2017-01-03 ENCOUNTER — Ambulatory Visit: Payer: BLUE CROSS/BLUE SHIELD | Admitting: Family Medicine

## 2017-01-06 ENCOUNTER — Encounter: Payer: Self-pay | Admitting: Family Medicine

## 2017-01-06 ENCOUNTER — Ambulatory Visit (INDEPENDENT_AMBULATORY_CARE_PROVIDER_SITE_OTHER): Payer: BLUE CROSS/BLUE SHIELD | Admitting: Family Medicine

## 2017-01-06 VITALS — BP 138/76 | HR 74 | Temp 99.5°F | Ht 65.0 in | Wt 192.0 lb

## 2017-01-06 DIAGNOSIS — R202 Paresthesia of skin: Secondary | ICD-10-CM

## 2017-01-06 DIAGNOSIS — E876 Hypokalemia: Secondary | ICD-10-CM | POA: Diagnosis not present

## 2017-01-06 DIAGNOSIS — H6590 Unspecified nonsuppurative otitis media, unspecified ear: Secondary | ICD-10-CM | POA: Insufficient documentation

## 2017-01-06 DIAGNOSIS — H6522 Chronic serous otitis media, left ear: Secondary | ICD-10-CM

## 2017-01-06 DIAGNOSIS — R7309 Other abnormal glucose: Secondary | ICD-10-CM

## 2017-01-06 DIAGNOSIS — D518 Other vitamin B12 deficiency anemias: Secondary | ICD-10-CM | POA: Diagnosis not present

## 2017-01-06 DIAGNOSIS — K219 Gastro-esophageal reflux disease without esophagitis: Secondary | ICD-10-CM | POA: Diagnosis not present

## 2017-01-06 DIAGNOSIS — R7303 Prediabetes: Secondary | ICD-10-CM

## 2017-01-06 DIAGNOSIS — R9431 Abnormal electrocardiogram [ECG] [EKG]: Secondary | ICD-10-CM

## 2017-01-06 HISTORY — DX: Abnormal electrocardiogram (ECG) (EKG): R94.31

## 2017-01-06 LAB — POCT GLYCOSYLATED HEMOGLOBIN (HGB A1C): Hemoglobin A1C: 6.2

## 2017-01-06 MED ORDER — PANTOPRAZOLE SODIUM 40 MG PO TBEC: 40.0000 mg | DELAYED_RELEASE_TABLET | Freq: Every day | ORAL | 11 refills | Status: DC

## 2017-01-06 MED ORDER — PANTOPRAZOLE SODIUM 40 MG PO TBEC: 40.0000 mg | DELAYED_RELEASE_TABLET | Freq: Every day | ORAL | 2 refills | Status: DC

## 2017-01-06 NOTE — Patient Instructions (Signed)
Otitis Media, Adult Otitis media is redness, soreness, and puffiness (swelling) in the space just behind your eardrum (middle ear). It may be caused by allergies or infection. It often happens along with a cold. Follow these instructions at home:  Take your medicine as told. Finish it even if you start to feel better.  Only take over-the-counter or prescription medicines for pain, discomfort, or fever as told by your doctor.  Follow up with your doctor as told. Contact a doctor if:  You have otitis media only in one ear, or bleeding from your nose, or both.  You notice a lump on your neck.  You are not getting better in 3-5 days.  You feel worse instead of better. Get help right away if:  You have pain that is not helped with medicine.  You have puffiness, redness, or pain around your ear.  You get a stiff neck.  You cannot move part of your face (paralysis).  You notice that the bone behind your ear hurts when you touch it. This information is not intended to replace advice given to you by your health care provider. Make sure you discuss any questions you have with your health care provider. Document Released: 11/30/2007 Document Revised: 11/19/2015 Document Reviewed: 01/08/2013 Elsevier Interactive Patient Education  2017 Elsevier Inc.  

## 2017-01-06 NOTE — Progress Notes (Signed)
Subjective:     Patient ID: Teresa [name], female   DOB: [birth year], 56 y.o.   MRN: [id number]  HPI ED F/U: She went to the ED for left ear pain. Pain is better now but she still feel clogged in her ear. There is associated dizziness with the fullness in her ears. She was also seen earlier with abdominal pain. No blood in her stool. Feels constipation on and off but better now. Pain started soon after colonoscopy. Tingling: C/O tingling in her left feet, occasionally on her right feet. Denies numbness or loss of sensation, no pain. Hypokalemia: Here for follow-up with ED lab Pre-Diabetes:Here for follow-up. She is working on diet and exercise. EKG abnormal:During last ED visit, she was informed she had an abnormal EKG. She denies chest pain, no palpitation. She feels well otherwise.   Current Outpatient Prescriptions on File Prior to Visit  Medication Sig Dispense Refill  . amLODipine (NORVASC) 10 MG tablet Take 1 tablet (10 mg total) by mouth daily. 90 tablet 2  . amoxicillin (AMOXIL) 500 MG capsule Take 1 capsule (500 mg total) by mouth 3 (three) times daily. 21 capsule 0  . carvedilol (COREG) 6.25 MG tablet Take 1 tablet (6.25 mg total) by mouth 2 (two) times daily with a meal. 180 tablet 2  . Cyanocobalamin (B-12 PO) Take 1 tablet by mouth daily.     . cycloSPORINE (RESTASIS) 0.05 % ophthalmic emulsion Place 1 drop into both eyes 2 (two) times daily.     . Hypromellose (ARTIFICIAL TEARS OP) Place 1 drop into both eyes as needed (for dry eyes). Reported on 08/13/2015    . levothyroxine (SYNTHROID, LEVOTHROID) 112 MCG tablet TAKE ONE TABLET BY MOUTH ONCE DAILY BEFORE BREAKFAST 90 tablet 1  . mometasone (NASONEX) 50 MCG/ACT nasal spray Place 2 sprays into the nose daily. 17 g 12  . naproxen (NAPROSYN) 500 MG tablet Take 1 tablet (500 mg total) by mouth 2 (two) times daily with a meal. (Patient not taking: Reported on 11/30/2016) 60 tablet 0  . oxyCODONE-acetaminophen (PERCOCET/ROXICET) 5-325 MG  tablet Take 1-2 tablets by mouth every 6 (six) hours as needed for severe pain. 12 tablet 0  . pantoprazole (PROTONIX) 40 MG tablet Take 1 tablet (40 mg total) by mouth daily. 30 tablet 11  . polyethylene glycol powder (GLYCOLAX/MIRALAX) powder Take 17 g by mouth 2 (two) times daily. (Patient taking differently: Take 17 g by mouth every other day. ) 850 g 2  . spironolactone (ALDACTONE) 25 MG tablet TAKE ONE TABLET BY MOUTH ONCE DAILY 90 tablet 1   Current Facility-Administered Medications on File Prior to Visit  Medication Dose Route Frequency Provider Last Rate Last Dose  . 0.9 %  sodium chloride infusion  500 mL Intravenous Continuous Pyrtle, Lajuan Lines, MD       Past Medical History:  Diagnosis Date  . Allergy   . Anemia   . Cervical disc disorder with radiculopathy of cervical region 02/10/2016  . Chronic lower back pain   . GERD (gastroesophageal reflux disease)   . H/O hiatal hernia   . History of amputation of lesser toe of left foot ([place]) 03/11/2015  . History of colon cancer   . Hypertension   . Hyperthyroidism    hx  . Hypothyroidism   . Migraine headache    "a few times/yr" (03/05/2015)  . Neck strain 02/06/2012  . Osteomyelitis ([place])    [name] 03/05/2015  . Osteomyelitis of toe of left foot ([place]) 03/05/2015  .  Palpitations 01/05/2016  . Positive TB test      Review of Systems  HENT: Positive for ear pain. Negative for ear discharge and hearing loss.   Respiratory: Negative.   Cardiovascular: Negative.  Negative for chest pain.  Gastrointestinal:       Epigastric pain  Musculoskeletal: Negative.   Neurological: Negative for numbness.       Tingling in her feet  All other systems reviewed and are negative.  Vitals:   01/06/17 0937  Weight: 192 lb (87.1 kg)       Objective:   Physical Exam  Constitutional: She is oriented to person, place, and time. She appears well-developed. No distress.  HENT:  Right Ear: Tympanic membrane and ear canal normal. No drainage or  swelling. No middle ear effusion. No hemotympanum.  Left Ear: No drainage or swelling. A middle ear effusion is present. No hemotympanum.  Cardiovascular: Normal rate, regular rhythm and normal heart sounds.   No murmur heard. Pulmonary/Chest: Effort normal and breath sounds normal. No respiratory distress. She has no wheezes.  Abdominal: Soft. Bowel sounds are normal. She exhibits no distension and no mass. There is no tenderness.  Musculoskeletal: Normal range of motion. She exhibits no edema.  Neurological: She is alert and oriented to person, place, and time. No cranial nerve deficit.  Psychiatric: She has a normal mood and affect.  Nursing note and vitals reviewed.      Assessment:     Serous otitis media GERD Paresthesia Hypokalemia Pre-DM EKG    Plan:     Check problem list. Protonix printed out initially with pre-refill x 11. I called patient to destroy script and I e-scribed Protonix to her pharmacy. She agreed to destroy script.

## 2017-01-06 NOTE — Assessment & Plan Note (Signed)
Improved on Protonix. Refill given.

## 2017-01-06 NOTE — Assessment & Plan Note (Signed)
A1C rechecked and still less than 6.4 Continue diet and exercise control

## 2017-01-06 NOTE — Assessment & Plan Note (Signed)
Etiology unclear. ?? Vitamin deficiency. B12 checked today. Anemia unlikely given recently normal hemoglobin. Continue B12 supplement. I will contact her with result.

## 2017-01-06 NOTE — Assessment & Plan Note (Signed)
Twave changes on EKG done at the ED. Almost similar to previous EKG. Patient is completely asymptomatic. Troponin done at the ED was normal. Monitor for now. Return precaution discussed.

## 2017-01-06 NOTE — Assessment & Plan Note (Signed)
Bmet rechecked. I will call her with result.

## 2017-01-06 NOTE — Assessment & Plan Note (Signed)
S/P A/B treatment from the ED. Since this is chronic I recommended ENT referral. Referral order placed.

## 2017-01-07 LAB — BASIC METABOLIC PANEL
BUN/Creatinine Ratio: 11 (ref 9–23)
BUN: 9 mg/dL (ref 6–24)
CO2: 24 mmol/L (ref 20–29)
Calcium: 10.1 mg/dL (ref 8.7–10.2)
Chloride: 103 mmol/L (ref 96–106)
Creatinine, Ser: 0.83 mg/dL (ref 0.57–1.00)
GFR calc Af Amer: 92 mL/min/{1.73_m2} (ref >59)
GFR calc non Af Amer: 80 mL/min/{1.73_m2} (ref >59)
Glucose: 105 mg/dL — ABNORMAL HIGH (ref 65–99)
Potassium: 3.8 mmol/L (ref 3.5–5.2)
Sodium: 143 mmol/L (ref 134–144)

## 2017-01-07 LAB — VITAMIN B12: Vitamin B-12: 754 pg/mL (ref 232–1245)

## 2017-01-09 ENCOUNTER — Telehealth: Payer: Self-pay | Admitting: Family Medicine

## 2017-01-09 NOTE — Telephone Encounter (Signed)
Result discussed with her. She stated she felt dizzy with the room spinning at work today so she went home to sleep and she feels better. I advised ED or clinic visit if this persists. She verbalized understanding.

## 2017-01-13 ENCOUNTER — Telehealth: Payer: Self-pay | Admitting: Family Medicine

## 2017-01-13 NOTE — Telephone Encounter (Signed)
Pt called because she was referred to [facility] and they can not see her until September and she can not wait that long. Can we find another ENT that can get her in sooner. jw

## 2017-01-13 NOTE — Telephone Encounter (Signed)
Will forward to referral coordinator to see what other options she has. Ammanda Dobbins,CMA

## 2017-01-17 ENCOUNTER — Encounter: Payer: Self-pay | Admitting: Family Medicine

## 2017-01-18 ENCOUNTER — Encounter: Payer: Self-pay | Admitting: *Deleted

## 2017-01-18 ENCOUNTER — Encounter: Payer: Self-pay | Admitting: Family Medicine

## 2017-01-18 NOTE — Telephone Encounter (Signed)
Note reviewed.

## 2017-01-18 NOTE — Telephone Encounter (Signed)
Patient saw Dr. Erik Obey on 9/89/2119 per notes from [facility]. Notes placed in Eniola's box for review.

## 2017-01-25 ENCOUNTER — Encounter: Payer: Self-pay | Admitting: Internal Medicine

## 2017-01-25 ENCOUNTER — Ambulatory Visit (INDEPENDENT_AMBULATORY_CARE_PROVIDER_SITE_OTHER): Payer: BLUE CROSS/BLUE SHIELD | Admitting: Internal Medicine

## 2017-01-25 VITALS — BP 138/82 | HR 76 | Ht 65.0 in | Wt 196.0 lb

## 2017-01-25 DIAGNOSIS — R110 Nausea: Secondary | ICD-10-CM | POA: Diagnosis not present

## 2017-01-25 DIAGNOSIS — K5904 Chronic idiopathic constipation: Secondary | ICD-10-CM

## 2017-01-25 DIAGNOSIS — R1011 Right upper quadrant pain: Secondary | ICD-10-CM | POA: Diagnosis not present

## 2017-01-25 DIAGNOSIS — R140 Abdominal distension (gaseous): Secondary | ICD-10-CM | POA: Diagnosis not present

## 2017-01-25 MED ORDER — ONDANSETRON 4 MG PO TBDP: 4.0000 mg | ORAL_TABLET | Freq: Four times a day (QID) | ORAL | 1 refills | Status: DC | PRN

## 2017-01-25 MED ORDER — PANTOPRAZOLE SODIUM 40 MG PO TBEC: 40.0000 mg | DELAYED_RELEASE_TABLET | Freq: Two times a day (BID) | ORAL | 2 refills | Status: DC

## 2017-01-25 NOTE — Patient Instructions (Addendum)
We have sent the following medications to your pharmacy for you to pick up at your convenience: Protonix 40 mg twice daily (increase from previous dose) zofran  ODT 4 mg every 6 hours as needed for nausea  You have been scheduled for a HIDA scan (2 hours) at [facility] (1st floor) on Friday, 02/03/17. Please arrive 15 minutes prior to your scheduled appointment at 9:98 am. Make certain not to have anything to eat or drink at least 6 hours prior to your test. Should this appointment date or time not work well for you, please call radiology scheduling at [phone].  _____________________________________________________________________ hepatobiliary (HIDA) scan is an imaging procedure used to diagnose problems in the liver, gallbladder and bile ducts. In the HIDA scan, a radioactive chemical or tracer is injected into a vein in your arm. The tracer is handled by the liver like bile. Bile is a fluid produced and excreted by your liver that helps your digestive system break down fats in the foods you eat. Bile is stored in your gallbladder and the gallbladder releases the bile when you eat a meal. A special nuclear medicine scanner (gamma camera) tracks the flow of the tracer from your liver into your gallbladder and small intestine.  During your HIDA scan  You'll be asked to change into a hospital gown before your HIDA scan begins. Your health care team will position you on a table, usually on your back. The radioactive tracer is then injected into a vein in your arm.The tracer travels through your bloodstream to your liver, where it's taken up by the bile-producing cells. The radioactive tracer travels with the bile from your liver into your gallbladder and through your bile ducts to your small intestine.You may feel some pressure while the radioactive tracer is injected into your vein. As you lie on the table, a special gamma camera is positioned over your abdomen taking pictures of the tracer as  it moves through your body. The gamma camera takes pictures continually for about an hour. You'll need to keep still during the HIDA scan. This can become uncomfortable, but you may find that you can lessen the discomfort by taking deep breaths and thinking about other things. Tell your health care team if you're uncomfortable. The radiologist will watch on a computer the progress of the radioactive tracer through your body. The HIDA scan may be stopped when the radioactive tracer is seen in the gallbladder and enters your small intestine. This typically takes about an hour. In some cases extra imaging will be performed if original images aren't satisfactory, if morphine is given to help visualize the gallbladder or if the medication CCK is given to look at the contraction of the gallbladder. This test typically takes 2 hours to complete. ________________________________________________________________________   Teresa [name] have been scheduled for a gastric emptying scan (4 hours) at [facility] on Thursday, 02/09/17 at 9:00 am. Please arrive at least 15 minutes prior to your appointment for registration. Please make certain not to have anything to eat or drink after midnight the night before your test. Hold all stomach medications (ex: Zofran, phenergan, Reglan) 48 hours prior to your test. If you need to reschedule your appointment, please contact radiology scheduling at [phone]. _____________________________________________________________________ A gastric-emptying study measures how long it takes for food to move through your stomach. There are several ways to measure stomach emptying. In the most common test, you eat food that contains a small amount of radioactive material. A scanner that detects the  movement of the radioactive material is placed over your abdomen to monitor the rate at which food leaves your stomach. This test normally takes about 4 hours to  complete. _____________________________________________________________________    Teresa [name] have been referred for a small intestine bacterial overgrowth test (SIBO) which is completed by a company named [facility]. Your demographic and insurance information has been sent to this company and they should be in contact with you over the next week regarding this test. Please keep in mind that you will be getting this call from phone number [phone] or a similar number. If you do not hear from them within this time frame, please call our office at [phone].   If you are age 28 or older, your body mass index should be between 23-30. Your Body mass index is 32.62 kg/m. If this is out of the aforementioned range listed, please consider follow up with your Primary Care Provider.  If you are age 88 or younger, your body mass index should be between 19-25. Your Body mass index is 32.62 kg/m. If this is out of the aformentioned range listed, please consider follow up with your Primary Care Provider.

## 2017-01-25 NOTE — Progress Notes (Addendum)
Subjective:    Patient ID: Teresa [name], female    DOB: 22-Jan-1961, 56 y.o.   MRN: [id number]  HPI Teresa [name] is a 56 year old female seen initially in June 2018 by Nicoletta Ba, PA-C to evaluate abdominal pain, constipation, change in bowel habits, GERD and dysphagia who is here for follow-up. She came for upper endoscopy and colonoscopy performed on 11/30/2016.  EGD and colonoscopy revealed the following EGD -- normal esophagus, GE junction widely patent at 39 cm. 1 cm hiatal hernia. Mild inflammation in the gastric cardia and fundus and gastric body. Biopsies performed. Distal stomach and examined duodenum normal. Pathology = mild chronic gastritis. Negative for Helicobacter pylori, IM and dysplasia. Colonoscopy -- 3 polyps removed from the colon measuring 4-5 mm. Multiple diverticuli in the left colon. 2 polyps were tubular adenoma, 1 hyperplastic.  Since her visit with Amy she has been taking pantoprazole 40 mg once daily and MiraLAX 17 g twice a day.  She reports that she had some relief of her upper abdominal pain with pantoprazole initially but this has returned to being "just as bad". This is a daily problem for her and she reports epigastric abdominal pain radiating under her right rib cage and into her back. His is associated with significant abdominal bloating. Eating seems to make the pain worse but often immediately after eating. She describes this as a soreness. She reports all I can do is "rub it" she denies nausea and vomiting. MiraLAX is definitively helped her bowel movements she is having one to 2 bowel movements per day. She also drinks water frequent throughout the day. She has added fruits and vegetables and protein back to her diet. She is also eating Activa daily. No blood in her stool or melena   Review of Systems As per history of present illness, otherwise negative  Current Medications, Allergies, Past Medical History, Past Surgical History, Family History and  Social History were reviewed in [employer] record.     Objective:   Physical Exam BP 138/82   Pulse 76   Ht 5\' 5"  (1.651 m)   Wt 196 lb (88.9 kg)   BMI 32.62 kg/m  Constitutional: Well-developed and well-nourished. No distress. HEENT: Normocephalic and atraumatic. Conjunctivae are normal.  No scleral icterus. Neck: Neck supple. Trachea midline. Cardiovascular: Normal rate, regular rhythm and intact distal pulses. No M/R/G Pulmonary/chest: Effort normal and breath sounds normal. No wheezing, rales or rhonchi. Abdominal: Soft, moderate epigastric and right upper quadrant tenderness with palpation without rebound or guarding, mild increase in tympany across the abdomen, bowel sounds active throughout. There are no masses palpable. Extremities: no clubbing, cyanosis, or edema Neurological: Alert and oriented to person place and time. Skin: Skin is warm and dry. Psychiatric: Normal mood and affect. Behavior is normal.  CBC    Component Value Date/Time   WBC 7.6 12/03/2016 1407   RBC 5.05 12/03/2016 1407   HGB 13.7 12/03/2016 1407   HCT 41.4 12/03/2016 1407   PLT 304 12/03/2016 1407   MCV 82.0 12/03/2016 1407   MCH 27.1 12/03/2016 1407   MCHC 33.1 12/03/2016 1407   RDW 13.9 12/03/2016 1407   LYMPHSABS 3.4 12/10/2015 2124   MONOABS 0.5 12/10/2015 2124   EOSABS 0.4 12/10/2015 2124   BASOSABS 0.1 12/10/2015 2124   CMP     Component Value Date/Time   NA 143 01/06/2017 1007   K 3.8 01/06/2017 1007   CL 103 01/06/2017 1007   CO2 24 01/06/2017 1007  GLUCOSE 105 (H) 01/06/2017 1007   GLUCOSE 140 (H) 12/03/2016 1407   BUN 9 01/06/2017 1007   CREATININE 0.83 01/06/2017 1007   CREATININE 0.93 08/03/2016 1051   CALCIUM 10.1 01/06/2017 1007   PROT 7.1 12/03/2016 1407   ALBUMIN 4.0 12/03/2016 1407   AST 20 12/03/2016 1407   ALT 25 12/03/2016 1407   ALKPHOS 79 12/03/2016 1407   BILITOT 0.6 12/03/2016 1407   GFRNONAA 80 01/06/2017 1007   GFRNONAA 70  08/03/2016 1051   GFRAA 92 01/06/2017 1007   GFRAA 81 08/03/2016 1051   Lab Results  Component Value Date   VITAMINB12 754 01/06/2017   Lipase     Component Value Date/Time   LIPASE 27 12/03/2016 1407   Lab Results  Component Value Date   TSH 1.710 11/04/2016       Assessment & Plan:  56 year old female seen initially in June 2018 by Nicoletta Ba, PA-C to evaluate abdominal pain, constipation, change in bowel habits, GERD and dysphagia who is here for follow-up.  1. Epigastric and right upper quadrant abdominal pain/bloating -- she describes symptoms as severe and daily. After endoscopy etiology is not immediately clear. She has had prior abdominal ultrasound which I reviewed today. This was performed in June 2018 and revealed a normal abdomen. The gallbladder appeared unremarkable and the CBD was 2 mm. There was a stable 2.5 cm liver hemangioma. Her liver enzymes, lipase and blood counts of the normal  Differential includes biliary dyskinesia, gastroparesis, SIBO, uncontrolled dyspepsia/gastritis and we'll evaluate with the following --Increase pantoprazole to 40 mg twice a day before meals --Add Zofran 4 mg every 6 hours when necessary nausea --Perform CCK HIDA scan rule out biliary dyskinesia --If HIDA is negative proceed with gastric emptying scan --Hydrogen breath testing for SIBO evaluation  2. Adenomatous colon polyps -- repeat colonoscopy in 5 years  3. Chronic constipation -- good response to MiraLAX. Continue MiraLAX 17 g twice a day  2-3 month office followup 25 minutes spent with the patient today. Greater than 50% was spent in counseling and coordination of care with the patient

## 2017-02-03 ENCOUNTER — Encounter (HOSPITAL_COMMUNITY)
Admission: RE | Admit: 2017-02-03 | Discharge: 2017-02-03 | Disposition: A | Discharge status: Home / Self Care | Payer: BLUE CROSS/BLUE SHIELD | Source: Ambulatory Visit | Attending: Internal Medicine | Admitting: Internal Medicine

## 2017-02-03 DIAGNOSIS — R140 Abdominal distension (gaseous): Secondary | ICD-10-CM | POA: Diagnosis present

## 2017-02-03 DIAGNOSIS — R1011 Right upper quadrant pain: Secondary | ICD-10-CM | POA: Diagnosis not present

## 2017-02-03 DIAGNOSIS — R110 Nausea: Secondary | ICD-10-CM | POA: Diagnosis present

## 2017-02-03 MED ORDER — TECHNETIUM TC 99M MEBROFENIN IV KIT
5.0000 | PACK | Freq: Once | INTRAVENOUS | Status: AC | PRN
  Administered 2017-02-03: 5 via INTRAVENOUS

## 2017-02-09 ENCOUNTER — Encounter (HOSPITAL_COMMUNITY)
Admission: RE | Admit: 2017-02-09 | Discharge: 2017-02-09 | Disposition: A | Discharge status: Home / Self Care | Payer: BLUE CROSS/BLUE SHIELD | Source: Ambulatory Visit | Attending: Internal Medicine | Admitting: Internal Medicine

## 2017-02-09 DIAGNOSIS — R140 Abdominal distension (gaseous): Secondary | ICD-10-CM | POA: Diagnosis present

## 2017-02-09 DIAGNOSIS — R1011 Right upper quadrant pain: Secondary | ICD-10-CM

## 2017-02-09 DIAGNOSIS — R110 Nausea: Secondary | ICD-10-CM | POA: Diagnosis present

## 2017-02-09 MED ORDER — TECHNETIUM TC 99M SULFUR COLLOID
2.0000 | Freq: Once | INTRAVENOUS | Status: AC | PRN
  Administered 2017-02-09: 2 via INTRAVENOUS

## 2017-02-13 ENCOUNTER — Other Ambulatory Visit: Payer: Self-pay

## 2017-02-13 DIAGNOSIS — R1011 Right upper quadrant pain: Secondary | ICD-10-CM

## 2017-02-15 ENCOUNTER — Ambulatory Visit (INDEPENDENT_AMBULATORY_CARE_PROVIDER_SITE_OTHER)
Admission: RE | Admit: 2017-02-15 | Discharge: 2017-02-15 | Disposition: A | Discharge status: Home / Self Care | Payer: BLUE CROSS/BLUE SHIELD | Source: Ambulatory Visit | Attending: Internal Medicine | Admitting: Internal Medicine

## 2017-02-15 DIAGNOSIS — R1011 Right upper quadrant pain: Secondary | ICD-10-CM | POA: Diagnosis not present

## 2017-02-15 MED ORDER — IOPAMIDOL (ISOVUE-300) INJECTION 61%
100.0000 mL | Freq: Once | INTRAVENOUS | Status: AC | PRN
  Administered 2017-02-15: 100 mL via INTRAVENOUS

## 2017-02-16 ENCOUNTER — Inpatient Hospital Stay: Admission: RE | Admit: 2017-02-16 | Payer: BLUE CROSS/BLUE SHIELD | Source: Ambulatory Visit

## 2017-02-24 ENCOUNTER — Encounter: Payer: Self-pay | Admitting: *Deleted

## 2017-03-01 ENCOUNTER — Ambulatory Visit (INDEPENDENT_AMBULATORY_CARE_PROVIDER_SITE_OTHER): Payer: BLUE CROSS/BLUE SHIELD | Admitting: Internal Medicine

## 2017-03-01 ENCOUNTER — Encounter: Payer: Self-pay | Admitting: Internal Medicine

## 2017-03-01 VITALS — BP 150/84 | HR 84 | Ht 65.0 in | Wt 196.2 lb

## 2017-03-01 DIAGNOSIS — F17200 Nicotine dependence, unspecified, uncomplicated: Secondary | ICD-10-CM

## 2017-03-01 DIAGNOSIS — R1013 Epigastric pain: Secondary | ICD-10-CM

## 2017-03-01 DIAGNOSIS — R1011 Right upper quadrant pain: Secondary | ICD-10-CM

## 2017-03-01 DIAGNOSIS — K219 Gastro-esophageal reflux disease without esophagitis: Secondary | ICD-10-CM | POA: Diagnosis not present

## 2017-03-01 NOTE — Progress Notes (Signed)
Subjective:    Patient ID: Teresa [name], female    DOB: 02-14-1961, 56 y.o.   MRN: [id number]  HPI Tyanna Hach is a 56 year old female seen in follow-up with a recent history of epigastric and right upper quadrant abdominal pain and bloating, history of adenomatous polyps and chronic constipation.She returns today and reports ongoing intermittent epigastric and right upper quadrant pain radiating around to her right upper back. This is worse after eating specifically worse after eating foods with high fat content and also meats, particularly red meat. She has tried a dramatically changed her diet eating baked fish and chicken. She's also instituted prunes, raisins and knots in an attempt to treat her constipation. She is off MiraLAX and does not want to rely on a laxative. Since changing her diet her stools have been soft and occasionally loose. She's not had further constipation. We did increase pantoprazole to 40 mg twice a day after last visit and her GERD, indigestion and reflux has improved. Her epigastric pain as described above has not. Her epigastric pain is daily and very bothersome to her. She also endorses lower back pain worse at the end of her workday.  To this point she has had extensive GI evaluation including endoscopy, ultrasound of the abdomen, HIDA scan, gastric imaging scan and CT scan. None of these tests have shown overt pathology. There is no evidence of gastroparesis.   Review of Systems     Objective:   Physical Exam There were no vitals taken for this visit. Constitutional: Well-developed and well-nourished. No distress. HEENT: Normocephalic and atraumatic. Oropharynx is clear and moist. Conjunctivae are normal.  No scleral icterus. Neck: Neck supple. Trachea midline. Cardiovascular: Normal rate, regular rhythm and intact distal pulses. No M/R/G Pulmonary/chest: Effort normal and breath sounds normal. No wheezing, rales or rhonchi. Abdominal: Soft, Epigastric and  right upper quadrant tenderness without rebound or guarding, nondistended. Bowel sounds active throughout. There are no masses palpable.  Extremities: no clubbing, cyanosis, or edema Neurological: Alert and oriented to person place and time. Skin: Skin is warm and dry. Psychiatric: Normal mood and affect. Behavior is normal.  CMP     Component Value Date/Time   NA 143 01/06/2017 1007   K 3.8 01/06/2017 1007   CL 103 01/06/2017 1007   CO2 24 01/06/2017 1007   GLUCOSE 105 (H) 01/06/2017 1007   GLUCOSE 140 (H) 12/03/2016 1407   BUN 9 01/06/2017 1007   CREATININE 0.83 01/06/2017 1007   CREATININE 0.93 08/03/2016 1051   CALCIUM 10.1 01/06/2017 1007   PROT 7.1 12/03/2016 1407   ALBUMIN 4.0 12/03/2016 1407   AST 20 12/03/2016 1407   ALT 25 12/03/2016 1407   ALKPHOS 79 12/03/2016 1407   BILITOT 0.6 12/03/2016 1407   GFRNONAA 80 01/06/2017 1007   GFRNONAA 70 08/03/2016 1051   GFRAA 92 01/06/2017 1007   GFRAA 81 08/03/2016 1051   CBC    Component Value Date/Time   WBC 7.6 12/03/2016 1407   RBC 5.05 12/03/2016 1407   HGB 13.7 12/03/2016 1407   HCT 41.4 12/03/2016 1407   PLT 304 12/03/2016 1407   MCV 82.0 12/03/2016 1407   MCH 27.1 12/03/2016 1407   MCHC 33.1 12/03/2016 1407   RDW 13.9 12/03/2016 1407   LYMPHSABS 3.4 12/10/2015 2124   MONOABS 0.5 12/10/2015 2124   EOSABS 0.4 12/10/2015 2124   BASOSABS 0.1 12/10/2015 2124    NUCLEAR MEDICINE HEPATOBILIARY IMAGING WITH GALLBLADDER EF   TECHNIQUE: Sequential images of the abdomen  were obtained out to 60 minutes following intravenous administration of radiopharmaceutical. After oral ingestion of Ensure, gallbladder ejection fraction was determined. At 60 min, normal ejection fraction is greater than 33%.   RADIOPHARMACEUTICALS:  5.3 mCi Tc-7m  Choletec IV   COMPARISON:  Ultrasound of 12/03/2016   FINDINGS: Satisfactory uptake of radiopharmaceutical from the blood pool. Biliary and gallbladder activity by 8 minutes.  Bowel activity at 37 minutes.   No symptoms upon ingesting insure.   Calculated gallbladder ejection fraction is forty-three%. (Normal gallbladder ejection fraction with Ensure is greater than 33%.)   IMPRESSION: Normal hepatobiliary scan, with gallbladder ejection fraction of 43%.     Electronically Signed   By: Van Clines M.D.   On: 02/03/2017 14:39 NUCLEAR MEDICINE GASTRIC EMPTYING SCAN   TECHNIQUE: After oral ingestion of radiolabeled meal, sequential abdominal images were obtained for 4 hours. Percentage of activity emptying the stomach was calculated at 1 hour, 2 hour, 3 hour, and 4 hours.   RADIOPHARMACEUTICALS:  2.1 mCi Tc-94m sulfur colloid in standardized meal   COMPARISON:  None   FINDINGS: Expected location of the stomach in the left upper quadrant.   Ingested meal empties the stomach gradually over the course of the study.   2% emptied at 1 hr ( normal >= 10%)   49% emptied at 2 hr ( normal >= 40%)   82% emptied at 3 hr ( normal >= 70%)   98% emptied at 4 hr ( normal >= 90%)   IMPRESSION: Normal gastric emptying study.     Electronically Signed   By: Lavonia Dana M.D.   On: 02/09/2017 15:21   CT ABDOMEN AND PELVIS WITH CONTRAST   TECHNIQUE: Multidetector CT imaging of the abdomen and pelvis was performed using the standard protocol following bolus administration of intravenous contrast.   CONTRAST:  140mL ISOVUE-300 IOPAMIDOL (ISOVUE-300) INJECTION 61%   COMPARISON:  11/14/2004   FINDINGS: Lower chest: Lung bases are clear.   Hepatobiliary: 2.7 x 3.2 cm hemangioma in the lateral segment left hepatic lobe (series 2/ image 12).   Additional probable 3.3 cm hemangioma in the lateral segment left hepatic lobe (series 2/image 17, less likely FNH for hepatic adenomas.   Focal fat/ are perfusion along the falciform ligament (series 2/ image 24).   Gallbladder is unremarkable. No intrahepatic or extrahepatic duct dilatation.     Pancreas: Within normal limits. No pancreatic mass, atrophy, or ductal dilatation.   Spleen: Within normal limits.   Adrenals/Urinary Tract: Adrenal glands within normal limits.   Two right renal cysts measuring up to 1.9 cm in the posterior right lower pole (series 2/ image 35). The kidneys within normal limits. No hydronephrosis.   Bladder is underdistended but unremarkable.   Stomach/Bowel: Stomach is within normal limits.   No evidence of bowel obstruction.   Appendix is not discretely visualized, reportedly surgically absent.   Vascular/Lymphatic: No evidence of abdominal aortic aneurysm.   Atherosclerotic calcifications of the abdominal aorta and branch vessels.   No suspicious abdominopelvic lymphadenopathy. Small retroperitoneal lymph nodes which do not meet pathologic CT size criteria.   Reproductive: Status post hysterectomy.   Bilateral ovaries are within normal limits.   Other: No abdominopelvic ascites.   Musculoskeletal: Mild degenerative changes at L5-S1.   IMPRESSION: No findings suspicious for malignancy in the abdomen/pelvis.   No evidence of bowel obstruction.  Prior appendectomy.   Two probable hemangiomas in the left hepatic lobe measuring up to 3.3 cm.     Electronically Signed  By: Julian Hy M.D.   On: 02/15/2017 16:30       Assessment & Plan:   56 year old female seen in follow-up with a recent history of epigastric and right upper quadrant abdominal pain and bloating, history of adenomatous polyps and chronic constipation.  1. Right upper quadrant abdominal pain associated with nausea -- after workup I am suspicious for gallbladder dysfunction and biliary type pain. There is no evidence of gastroparesis and no overt pathology by upper endoscopy. She is on twice a day PPI. After extensive workup given her ongoing issue I have recommended surgical consultation for cholecystectomy. I explained that there is a small chance that  cholecystectomy would not improve her abdominal pain and after this discussion she wishes to proceed with consultation and possible surgery. I will have her submit her SIBO breath test to rule out bacterial overgrowth however bacterial overgrowth is not felt to explain her intermittent and significant right upper quadrant pain.  2. GERD/indigestion -- improved with pantoprazole 40 mg twice a day. Twice a day dosing working better for her than once daily dosing. We discussed the risk and benefit of chronic PPI use she wishes to continue this dose.  3. Chronic constipation -- doing well with diet change and increasing fresh fruits vegetables and fiber in her diet. She is off MiraLAX. Continue high-fiber diet  4. History of colon polyps -- surveillance colonoscopy recommended in 5 years  5. Tobacco use disorder -- she is making every effort to quit smoking. She has smoked one pack of cigarettes per day for many years. She has not had a cigarette today and is working every day to continue to quit. She is congratulated and encouraged to continue with tobacco cessation  25 minutes spent with the patient today. Greater than 50% was spent in counseling and coordination of care with the patient

## 2017-03-01 NOTE — Patient Instructions (Signed)
You have been scheduled for an appointment with ____________ at [facility]. Your appointment is on _______________ at _____________. Please arrive at ____________ for registration. Make certain to bring a list of current medications, including any over the counter medications or vitamins. Also bring your co-pay if you have one as well as your insurance cards. [facility] is located at [address]. Should you need to reschedule your appointment, please contact them at [phone].  If you are age 76 or older, your body mass index should be between 23-30. Your Body mass index is 32.65 kg/m. If this is out of the aforementioned range listed, please consider follow up with your Primary Care Provider.  If you are age 12 or younger, your body mass index should be between 19-25. Your Body mass index is 32.65 kg/m. If this is out of the aformentioned range listed, please consider follow up with your Primary Care Provider.   Please complete your testing for small intestine bacterial overgrowth and send back in.

## 2017-03-08 ENCOUNTER — Telehealth: Payer: Self-pay | Admitting: *Deleted

## 2017-03-08 NOTE — Telephone Encounter (Signed)
Patient has been advised that she is scheduled for an appointment with Dr Rosendo Gros at [facility] to discuss possible cholecystectomy on 03/20/17 @ 10:40 am with 10:10 am arrival. She verbalizes understanding of time/date/location of this appointment.

## 2017-03-23 ENCOUNTER — Encounter: Payer: Self-pay | Admitting: Internal Medicine

## 2017-03-23 DIAGNOSIS — K6389 Other specified diseases of intestine: Secondary | ICD-10-CM | POA: Insufficient documentation

## 2017-03-28 ENCOUNTER — Other Ambulatory Visit: Payer: Self-pay | Admitting: General Surgery

## 2017-03-28 ENCOUNTER — Encounter: Payer: Self-pay | Admitting: Cardiovascular Disease

## 2017-03-29 ENCOUNTER — Telehealth: Payer: Self-pay | Admitting: *Deleted

## 2017-03-29 MED ORDER — AMOXICILLIN-POT CLAVULANATE 875-125 MG PO TABS: 1.0000 | ORAL_TABLET | Freq: Two times a day (BID) | ORAL | 0 refills | Status: DC

## 2017-03-29 NOTE — Telephone Encounter (Signed)
Dr Hilarie Fredrickson has received patient's small intestinal bacterial overgrowth test from [facility]. Per Dr Hilarie Fredrickson, "SIBO +. Please treat with antibiotics. She has RUQ pain-this may not/likely does NOT fully explain RUQ pain. Gallbladder dysfunction still suspected. She is scheduled for lap chole. We can treat SIBO to see what symptoms change. Augmentin 875 twice daily x 7 days. JMP"  I have spoken to patient to advise of positive bacterial overgrowth test. She verbalizes understanding. She states that she actually had her cholecystectomy yesterday. I have advised that we will send Augmentin to her pharmacy but that if she is still under the care of her surgeon, just to get their clearance on this as well. She agrees to this plan.

## 2017-03-30 NOTE — Telephone Encounter (Signed)
Noted  

## 2017-03-30 NOTE — Telephone Encounter (Signed)
Sunday Spillers @ [facility] called and said that Dr. Rosendo Gros cleared the patient to take Augmentin.

## 2017-04-04 ENCOUNTER — Encounter: Payer: Self-pay | Admitting: Family Medicine

## 2017-04-05 ENCOUNTER — Other Ambulatory Visit: Payer: Self-pay | Admitting: Family Medicine

## 2017-04-05 MED ORDER — FLUCONAZOLE 150 MG PO TABS: 150.0000 mg | ORAL_TABLET | Freq: Once | ORAL | 0 refills | Status: AC

## 2017-06-06 ENCOUNTER — Ambulatory Visit: Payer: BLUE CROSS/BLUE SHIELD | Admitting: Family Medicine

## 2017-06-08 ENCOUNTER — Other Ambulatory Visit: Payer: Self-pay | Admitting: Family Medicine

## 2017-06-08 MED ORDER — LEVOTHYROXINE SODIUM 112 MCG PO TABS: ORAL_TABLET | ORAL | 1 refills | Status: DC

## 2017-06-08 NOTE — Telephone Encounter (Signed)
Pt needs refill on thyroid medicine. Her appt is jan 2. It was rescheduled from Dec 11.  [facility] on [address]

## 2017-06-15 IMAGING — DX DG SHOULDER 2+V*L*
3 series · 3 of 3 positions shown · non-contrast
Comparison: Portable chest x-ray October 27, 2013 which included much
of the left shoulder.

CLINICAL DATA: Several month history of neck and dorsal left
shoulder pain. No known injury.

EXAM:
LEFT SHOULDER - 2+ VIEW

[w shoulder external left]
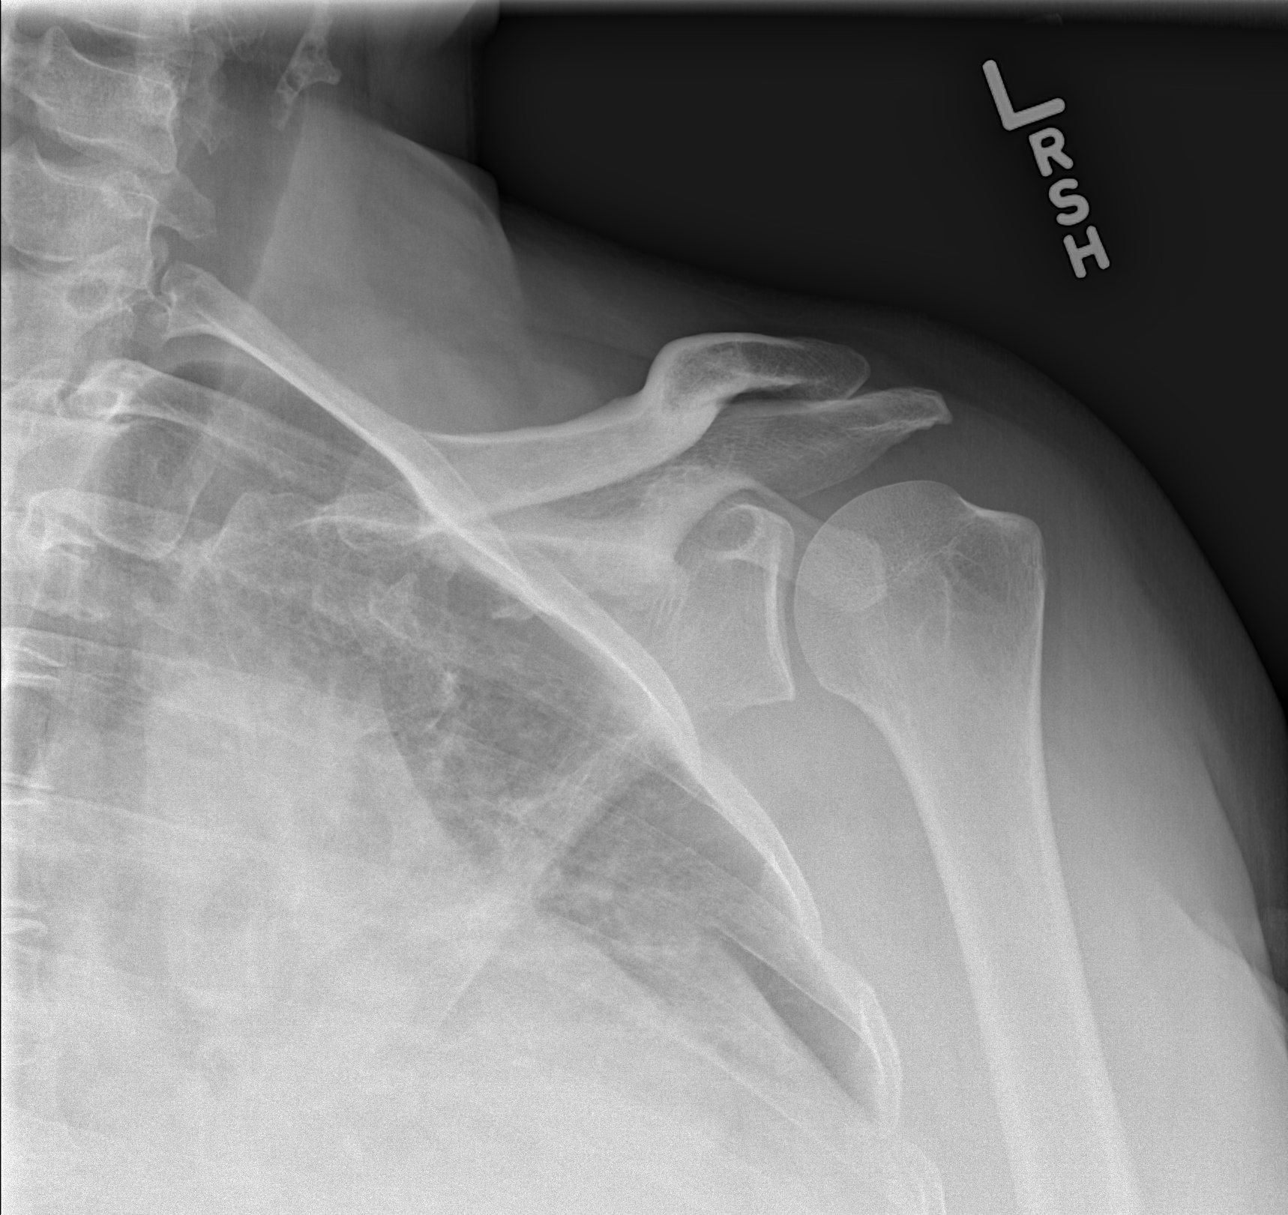

[w shoulder y-view left]
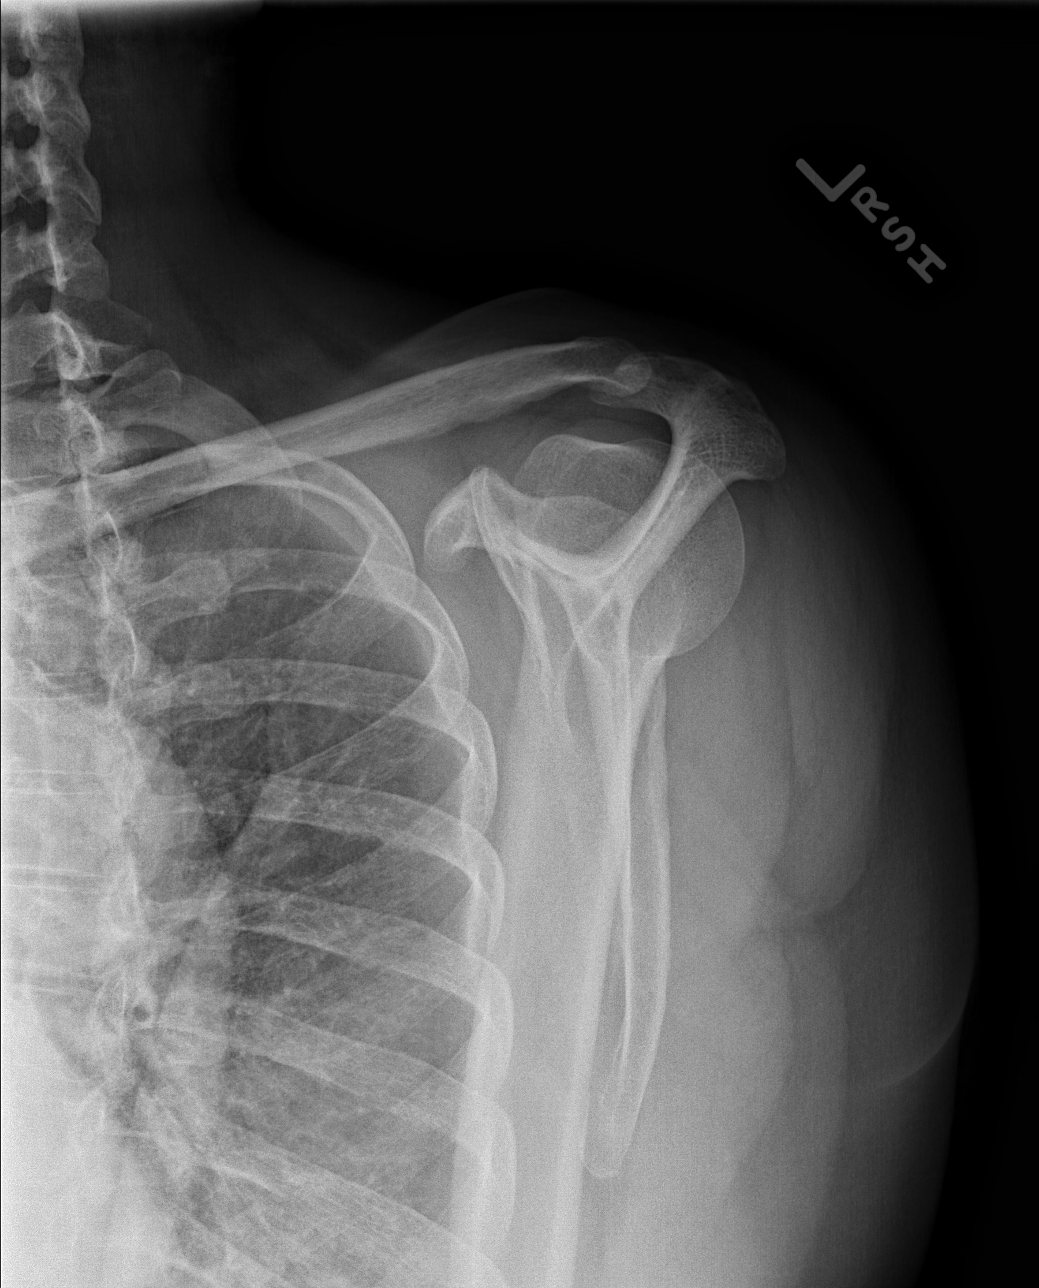

[x shoulder axillary left]
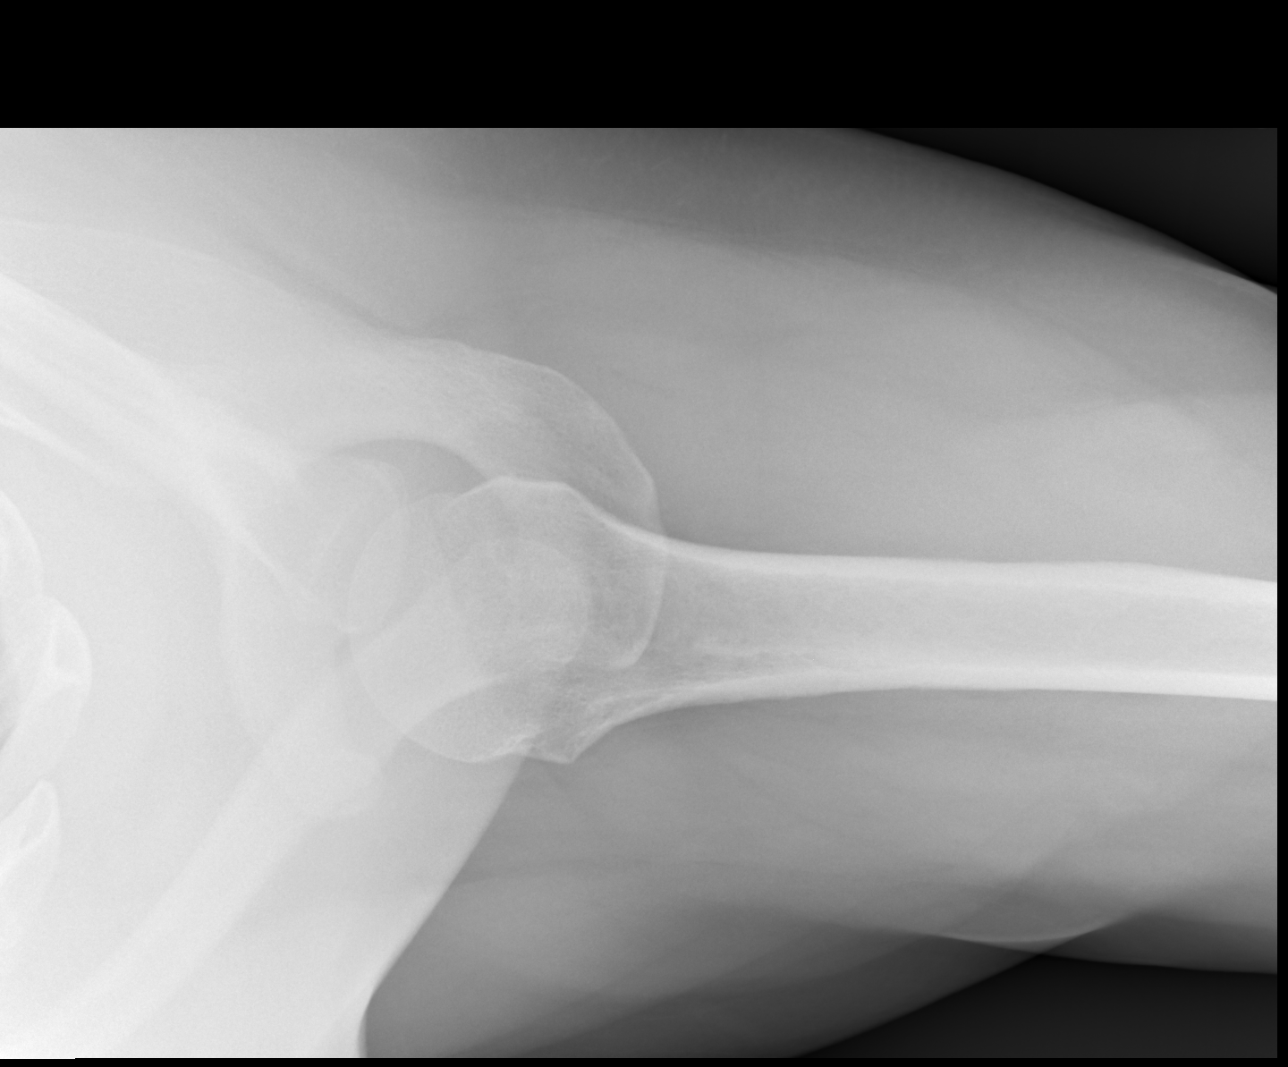

[3 of 3 positions shown; findings below may reference images not displayed]

FINDINGS: The bones are adequately mineralized. There is no acute or old
fracture. The glenohumeral and AC joint spaces are preserved. The
articular surfaces of the joints appear smooth. The subacromial
subdeltoid space is well maintained. The observed portions of the
left clavicle and upper left ribs are normal.
IMPRESSION: There is no acute or significant chronic bony abnormality of the
left shoulder.

## 2017-06-26 ENCOUNTER — Emergency Department (HOSPITAL_COMMUNITY): Payer: BLUE CROSS/BLUE SHIELD

## 2017-06-26 ENCOUNTER — Other Ambulatory Visit: Payer: Self-pay

## 2017-06-26 ENCOUNTER — Emergency Department (HOSPITAL_COMMUNITY)
Admission: EM | Admit: 2017-06-26 | Discharge: 2017-06-26 | Disposition: A | Discharge status: Home / Self Care | Payer: BLUE CROSS/BLUE SHIELD | Attending: Emergency Medicine | Admitting: Emergency Medicine

## 2017-06-26 ENCOUNTER — Encounter (HOSPITAL_COMMUNITY): Payer: Self-pay | Admitting: Emergency Medicine

## 2017-06-26 DIAGNOSIS — M79601 Pain in right arm: Secondary | ICD-10-CM | POA: Diagnosis present

## 2017-06-26 DIAGNOSIS — I10 Essential (primary) hypertension: Secondary | ICD-10-CM | POA: Diagnosis not present

## 2017-06-26 DIAGNOSIS — Z79899 Other long term (current) drug therapy: Secondary | ICD-10-CM | POA: Diagnosis not present

## 2017-06-26 DIAGNOSIS — F17210 Nicotine dependence, cigarettes, uncomplicated: Secondary | ICD-10-CM | POA: Insufficient documentation

## 2017-06-26 DIAGNOSIS — M25511 Pain in right shoulder: Secondary | ICD-10-CM | POA: Insufficient documentation

## 2017-06-26 MED ORDER — NAPROXEN 500 MG PO TABS: 500.0000 mg | ORAL_TABLET | Freq: Two times a day (BID) | ORAL | 0 refills | Status: DC

## 2017-06-26 MED ORDER — CYCLOBENZAPRINE HCL 10 MG PO TABS: 10.0000 mg | ORAL_TABLET | Freq: Three times a day (TID) | ORAL | 0 refills | Status: DC | PRN

## 2017-06-26 MED ORDER — HYDROMORPHONE HCL 1 MG/ML IJ SOLN
2.0000 mg | Freq: Once | INTRAMUSCULAR | Status: AC
  Administered 2017-06-26: 2 mg via INTRAMUSCULAR
  Filled 2017-06-26: qty 2

## 2017-06-26 MED ORDER — KETOROLAC TROMETHAMINE 60 MG/2ML IM SOLN
60.0000 mg | Freq: Once | INTRAMUSCULAR | Status: AC
  Administered 2017-06-26: 60 mg via INTRAMUSCULAR
  Filled 2017-06-26: qty 2

## 2017-06-26 NOTE — ED Provider Notes (Addendum)
[facility] EMERGENCY DEPARTMENT Provider Note   CSN: [id number] Arrival date & time: 06/26/17  1102     History   Chief Complaint Chief Complaint  Patient presents with  . Arm Pain  . Numbness    HPI Teresa [name] is a 56 y.o. female.  HPI  56 year old female presents with right arm pain.  It starts in the shoulder.  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  She thinks this is aggravated the shoulder.  She notes that her shoulder is weak but she thinks this is due to the severe pain in the arm.  Whenever she lifts her arm or moves it barely it causes severe pain in her shoulder.  The pain seems to shoot down her arm.  A little bit of pain over her trapezius but no neck pain.  Had a headache when she was waiting in the waiting room but that headache is better.  Otherwise no headaches.  She has tried Flexeril, icy hot patch, and a leftover hydrocodone without relief.  No numbness in the arm.  No chest pain or shortness of breath.  Past Medical History:  Diagnosis Date  . Allergy   . Anemia   . Cervical disc disorder with radiculopathy of cervical region 02/10/2016  . Chronic lower back pain   . Diverticulosis   . Gastritis   . GERD (gastroesophageal reflux disease)   . H/O hiatal hernia   . Hiatal hernia   . History of amputation of lesser toe of left foot ([place]) 03/11/2015  . History of colon cancer   . Hypertension   . Hyperthyroidism    hx  . Hypothyroidism   . Migraine headache    "a few times/yr" (03/05/2015)  . Neck strain 02/06/2012  . Osteomyelitis ([place])    [name] 03/05/2015  . Osteomyelitis of toe of left foot ([place]) 03/05/2015  . Palpitations 01/05/2016  . Positive TB test   . Tubular adenoma of colon   . Tubular adenoma of colon     Patient Active Problem List   Diagnosis Date Noted  . Small intestinal bacterial overgrowth 03/23/2017  . Serous otitis media 01/06/2017  . Paresthesia 01/06/2017  .  Hypokalemia 01/06/2017  . EKG, abnormal 01/06/2017  . Constipation 11/04/2016  . Left ankle pain 10/11/2016  . Pain of right heel 10/11/2016  . Cerumen impaction 09/27/2016  . Prediabetes 10/29/2015  . Tendinopathy of left rotator cuff 10/27/2015  . Essential hypertension   . Obesity 04/15/2014  . Lateral knee pain, left 08/06/2013  . Allergic rhinitis 07/19/2013  . Thyroid nodule 02/12/2013  . GERD (gastroesophageal reflux disease) 02/06/2012  . Tobacco abuse 09/21/2011  . Hypothyroidism 09/21/2011  . Throat pain 09/21/2011    Past Surgical History:  Procedure Laterality Date  . ABDOMINAL HYSTERECTOMY  2006  . AMPUTATION TOE Left 03/05/2015   Procedure: AMPUTATION LEFT 5TH  TOE;  Surgeon: Wylene Simmer, MD;  Location: [facility];  Service: Orthopedics;  Laterality: Left;  . APPENDECTOMY  2006  . BREAST BIOPSY Left 11/26/2014  . [place]  . COLONOSCOPY    . TOE AMPUTATION Left 03/05/2015   5th toe  . TUBAL LIGATION Bilateral 1990  . UPPER GASTROINTESTINAL ENDOSCOPY      OB History    No data available       Home Medications    Prior to Admission medications   Medication Sig Start Date End Date Taking?  Authorizing Provider  amoxicillin-clavulanate (AUGMENTIN) 875-125 MG tablet Take 1 tablet by mouth 2 (two) times daily. 03/29/17   Pyrtle, Lajuan Lines, MD  carvedilol (COREG) 6.25 MG tablet Take 1 tablet (6.25 mg total) by mouth 2 (two) times daily with a meal. 09/27/16 03/01/17  Kinnie Feil, MD  Cyanocobalamin (B-12 PO) Take 1 tablet by mouth daily.     [provider]  cyclobenzaprine (FLEXERIL) 10 MG tablet Take 1 tablet (10 mg total) by mouth 3 (three) times daily as needed for muscle spasms. 06/26/17   Sherwood Gambler, MD  cycloSPORINE (RESTASIS) 0.05 % ophthalmic emulsion Place 1 drop into both eyes 2 (two) times daily.     [provider]  Hypromellose (ARTIFICIAL TEARS OP) Place 1 drop into both eyes as needed (for dry eyes). Reported on  08/13/2015    [provider]  levothyroxine (SYNTHROID, LEVOTHROID) 112 MCG tablet TAKE ONE TABLET BY MOUTH ONCE DAILY BEFORE BREAKFAST 06/08/17   Eniola, Tawanna Solo T, MD  mometasone (NASONEX) 50 MCG/ACT nasal spray Place 2 sprays into the nose daily. 09/27/16   Kinnie Feil, MD  naproxen (NAPROSYN) 500 MG tablet Take 1 tablet (500 mg total) by mouth 2 (two) times daily with a meal. 06/26/17   Sherwood Gambler, MD  ondansetron (ZOFRAN ODT) 4 MG disintegrating tablet Take 1 tablet (4 mg total) by mouth every 6 (six) hours as needed for nausea or vomiting. 01/25/17   Pyrtle, Lajuan Lines, MD  pantoprazole (PROTONIX) 40 MG tablet Take 1 tablet (40 mg total) by mouth 2 (two) times daily. 01/25/17   Pyrtle, Lajuan Lines, MD  spironolactone (ALDACTONE) 25 MG tablet TAKE ONE TABLET BY MOUTH ONCE DAILY 09/27/16   Kinnie Feil, MD    Family History Family History  Problem Relation Age of Onset  . Heart disease Mother        CAB age 70  . Diabetes Mother   . Cancer Father 16       pancreatic cancer  . Stroke Father   . Diabetes Sister   . Kidney disease Sister        renal failure  . Diabetes Brother   . Kidney disease Brother        renal failure  . Heart disease Other   . Diabetes Other   . Breast cancer Maternal Aunt        per pt aunts and uncles died of cancer not sure the type  . Breast cancer Paternal Aunt   . Esophageal cancer Neg Hx   . Rectal cancer Neg Hx   . Stomach cancer Neg Hx     Social History Social History   Tobacco Use  . Smoking status: Current Every Day Smoker    Packs/day: 0.50    Years: 36.00    Pack years: 18.00    Types: Cigarettes  . Smokeless tobacco: Never Used  . Tobacco comment: Tobacco info given 03/01/17  Substance Use Topics  . Alcohol use: No  . Drug use: No     Allergies   Bactrim [sulfamethoxazole-trimethoprim]; Clonidine derivatives; Aspirin; and Latex   Review of Systems Review of Systems  Constitutional: Negative for fever.  Respiratory:  Negative for shortness of breath.   Cardiovascular: Negative for chest pain.  Musculoskeletal: Positive for arthralgias.  Neurological: Negative for numbness.  All other systems reviewed and are negative.    Physical Exam Updated Vital Signs BP (!) 169/72 (BP Location: Left Arm)   Pulse 71   Temp  98.7 F (37.1 C) (Oral)   Resp 16   SpO2 97%   Physical Exam  Constitutional: She is oriented to person, place, and time. She appears well-developed and well-nourished. No distress.  HENT:  Head: Normocephalic and atraumatic.  Right Ear: External ear normal.  Left Ear: External ear normal.  Nose: Nose normal.  Eyes: Right eye exhibits no discharge. Left eye exhibits no discharge.  Cardiovascular: Normal rate, regular rhythm and normal heart sounds.  Pulses:      Radial pulses are 2+ on the right side.  Pulmonary/Chest: Effort normal and breath sounds normal.  Abdominal: Soft. There is no tenderness.  Musculoskeletal:       Right shoulder: She exhibits decreased range of motion and tenderness. She exhibits no deformity.  Point tenderness to the right shoulder, anterior.  Limited range of motion due to pain, both active and passive.  However she does have slow range of motion.  She is able to hold up her arm against gravity.  No other focal tenderness including the elbow, arm, and hand.  No numbness.  Neurological: She is alert and oriented to person, place, and time.  5/5 strength in bilateral upper extremities, though using the right arm is very painful.  However she is able to hold it up against gravity and squeeze my hands without difficulty.  Normal gross sensation.  Skin: Skin is warm and dry. She is not diaphoretic.  Nursing note and vitals reviewed.    ED Treatments / Results  Labs (all labs ordered are listed, but only abnormal results are displayed) Labs Reviewed - No data to display  EKG  EKG Interpretation  Date/Time:  Monday June 26 2017 11:13:57  EST Ventricular Rate:  77 PR Interval:  144 QRS Duration: 74 QT Interval:  414 QTC Calculation: 468 R Axis:   8 Text Interpretation:  Normal sinus rhythm Biatrial enlargement Left ventricular hypertrophy with repolarization abnormality Abnormal ECG No significant change since last tracing Confirmed by Merrily Pew [phone]) on 06/26/2017 11:17:55 AM       Radiology Dg Shoulder Right  Result Date: 06/26/2017 CLINICAL DATA:  Shoulder pain and limited range of motion. No injury. EXAM: RIGHT SHOULDER - 2+ VIEW COMPARISON:  04/15/2014 FINDINGS: The joint spaces are maintained. No significant degenerative changes or fracture. No abnormal soft tissue calcifications. The visualize right lung is clear. IMPRESSION: No fracture or dislocation.  No change since prior study from 2015. Electronically Signed   By: Marijo Sanes M.D.   On: 06/26/2017 12:24    Procedures Procedures (including critical care time)  Medications Ordered in ED Medications  HYDROmorphone (DILAUDID) injection 2 mg (2 mg Intramuscular Given 06/26/17 2130)  ketorolac (TORADOL) injection 60 mg (60 mg Intramuscular Given 06/26/17 2130)     Initial Impression / Assessment and Plan / ED Course  I have reviewed the triage vital signs and the nursing notes.  Pertinent labs & imaging results that were available during my care of the patient were reviewed by me and considered in my medical decision making (see chart for details).     Patient's appears to have severe right shoulder pain that is likely muscular in etiology.  Probably from overuse.  She does not appear to truly be weak and her movement is much better after IM treatment with Dilaudid and Toradol.  She has no acute neurologic symptoms.  I do not think this coming from her neck.  Given shoulder exercises and will treat with NSAIDs and muscle relaxer.  Highly  doubt this is cardiac.  Follow-up with PCP in 2 days as scheduled.  Return precautions.  Final Clinical  Impressions(s) / ED Diagnoses   Final diagnoses:  Acute pain of right shoulder    ED Discharge Orders        Ordered    cyclobenzaprine (FLEXERIL) 10 MG tablet  3 times daily PRN     06/26/17 2233    naproxen (NAPROSYN) 500 MG tablet  2 times daily with meals     06/26/17 2233       Sherwood Gambler, MD 06/27/17 Doreene Adas, MD 06/27/17 [phone]

## 2017-06-26 NOTE — ED Triage Notes (Signed)
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 Pt a/ox4, resp e/u, speech clear, grip strength equal, face symmetrical.

## 2017-06-26 NOTE — ED Notes (Signed)
Pt asked about wait time.

## 2017-06-27 ENCOUNTER — Encounter: Payer: Self-pay | Admitting: Family Medicine

## 2017-06-27 DIAGNOSIS — Z9049 Acquired absence of other specified parts of digestive tract: Secondary | ICD-10-CM | POA: Insufficient documentation

## 2017-06-28 ENCOUNTER — Encounter: Payer: Self-pay | Admitting: Family Medicine

## 2017-06-28 ENCOUNTER — Other Ambulatory Visit: Payer: Self-pay

## 2017-06-28 ENCOUNTER — Ambulatory Visit (INDEPENDENT_AMBULATORY_CARE_PROVIDER_SITE_OTHER): Payer: BLUE CROSS/BLUE SHIELD | Admitting: Family Medicine

## 2017-06-28 VITALS — BP 138/90 | HR 76 | Temp 98.2°F | Ht 65.0 in | Wt 196.0 lb

## 2017-06-28 DIAGNOSIS — R7309 Other abnormal glucose: Secondary | ICD-10-CM

## 2017-06-28 DIAGNOSIS — R7303 Prediabetes: Secondary | ICD-10-CM | POA: Diagnosis not present

## 2017-06-28 DIAGNOSIS — E039 Hypothyroidism, unspecified: Secondary | ICD-10-CM | POA: Diagnosis not present

## 2017-06-28 DIAGNOSIS — Z23 Encounter for immunization: Secondary | ICD-10-CM | POA: Diagnosis not present

## 2017-06-28 DIAGNOSIS — M25511 Pain in right shoulder: Secondary | ICD-10-CM | POA: Diagnosis not present

## 2017-06-28 DIAGNOSIS — I10 Essential (primary) hypertension: Secondary | ICD-10-CM

## 2017-06-28 LAB — POCT GLYCOSYLATED HEMOGLOBIN (HGB A1C): Hemoglobin A1C: 6.3

## 2017-06-28 NOTE — Assessment & Plan Note (Signed)
Compliant with meds.  Continue Same dose.

## 2017-06-28 NOTE — Patient Instructions (Signed)

## 2017-06-28 NOTE — Progress Notes (Signed)
Subjective:     Patient ID: Teresa [name], female   DOB: 1961-04-11, 57 y.o.   MRN: [id number]  Shoulder Injury   Incident location: Left shoulder pain for about 3-4 weeks. The right (had similar presentation few months back with her left shoulder ) shoulder is affected. The incident occurred 3 to 5 days ago. Injury mechanism: Denies direct injury but might have overused it at work during the holiday season as well as moving stuff up and down the house. The quality of the pain is described as aching. The pain radiates to the right neck. The pain is at a severity of 4/10 (Came be up to 7/10 in severity). The pain is moderate. Pertinent negatives include no chest pain, muscle weakness, numbness or tingling. Associated symptoms comments: It was numb few days ago but better now.. The symptoms are aggravated by movement and overhead lifting. She has tried NSAIDs (Muscle relaxants) for the symptoms. The treatment provided moderate relief.  HTN/Hypotension:Compliant with meds. Here for follow-up. Pre Diabetes: Here for follow-up. She is compliant with diet and exercise.  Current Outpatient Medications on File Prior to Visit  Medication Sig Dispense Refill  . carvedilol (COREG) 6.25 MG tablet Take 1 tablet (6.25 mg total) by mouth 2 (two) times daily with a meal. 180 tablet 2  . Cyanocobalamin (B-12 PO) Take 1 tablet by mouth daily.     . cyclobenzaprine (FLEXERIL) 10 MG tablet Take 1 tablet (10 mg total) by mouth 3 (three) times daily as needed for muscle spasms. 15 tablet 0  . cycloSPORINE (RESTASIS) 0.05 % ophthalmic emulsion Place 1 drop into both eyes 2 (two) times daily.     . Hypromellose (ARTIFICIAL TEARS OP) Place 1 drop into both eyes as needed (for dry eyes). Reported on 08/13/2015    . levothyroxine (SYNTHROID, LEVOTHROID) 112 MCG tablet TAKE ONE TABLET BY MOUTH ONCE DAILY BEFORE BREAKFAST 90 tablet 1  . naproxen (NAPROSYN) 500 MG tablet Take 1 tablet (500 mg total) by mouth 2 (two) times daily  with a meal. 14 tablet 0  . oxymetazoline (AFRIN) 0.05 % nasal spray Place 1 spray into both nostrils 2 (two) times daily.    Marland Kitchen spironolactone (ALDACTONE) 25 MG tablet TAKE ONE TABLET BY MOUTH ONCE DAILY 90 tablet 1  . mometasone (NASONEX) 50 MCG/ACT nasal spray Place 2 sprays into the nose daily. (Patient not taking: Reported on 06/28/2017) 17 g 12  . pantoprazole (PROTONIX) 40 MG tablet Take 1 tablet (40 mg total) by mouth 2 (two) times daily. (Patient not taking: Reported on 06/28/2017) 60 tablet 2   No current facility-administered medications on file prior to visit.    Past Medical History:  Diagnosis Date  . Allergy   . Anemia   . Cervical disc disorder with radiculopathy of cervical region 02/10/2016  . Chronic lower back pain   . Diverticulosis   . Gastritis   . GERD (gastroesophageal reflux disease)   . H/O hiatal hernia   . Hiatal hernia   . History of amputation of lesser toe of left foot ([place]) 03/11/2015  . History of colon cancer   . Hypertension   . Hyperthyroidism    hx  . Hypothyroidism   . Migraine headache    "a few times/yr" (03/05/2015)  . Neck strain 02/06/2012  . Osteomyelitis ([place])    [name] 03/05/2015  . Osteomyelitis of toe of left foot ([place]) 03/05/2015  . Palpitations 01/05/2016  . Positive TB test   . Tubular adenoma of colon   .  Tubular adenoma of colon    Vitals:   06/28/17 1332  BP: 138/90  Pulse: 76  Temp: 98.2 F (36.8 C)  TempSrc: Oral  SpO2: 99%  Weight: 196 lb (88.9 kg)  Height: 5\' 5"  (1.651 m)      Review of Systems  Respiratory: Negative.   Cardiovascular: Negative.  Negative for chest pain.  Musculoskeletal:       Right shoulder pain  Neurological: Negative for tingling and numbness.  All other systems reviewed and are negative.      Objective:   Physical Exam  Constitutional: She appears well-developed. No distress.  Neck: No thyromegaly present.  Cardiovascular: Normal rate and normal heart sounds.  No murmur  heard. Pulmonary/Chest: Effort normal and breath sounds normal. No respiratory distress. She has no wheezes.  Abdominal: Soft. She exhibits no mass. There is no tenderness.  Musculoskeletal: She exhibits no edema.       Right shoulder: She exhibits decreased range of motion and tenderness. She exhibits no swelling and no crepitus.       Left shoulder: Normal.  Nursing note and vitals reviewed.      Assessment:     Right shoulder pain HTN Hypothyroidism Pre DM    Plan:     Check problem list.

## 2017-06-28 NOTE — Assessment & Plan Note (Signed)
A1C looks good. COntinue diet and exercise control. Recheck in 6 months.

## 2017-06-28 NOTE — Assessment & Plan Note (Signed)
Likely due to overuse. Rest shoulder with intermittent ROM exercise recommended. Xray reviewed without acute finding. Continue NSAID and Flexeril prn. If persistent we will refer to PT or further imaging. She agreed with plan.

## 2017-06-28 NOTE — Assessment & Plan Note (Signed)
Compliant with meds. Doing well on Aldactone and Coreg although diastolic BP is slightly elevated. We will watch for now. She asked if she can take Lipogaine for hair loss which contains Minoxidil. Concern it might affect her BP. Since this is topical agents, less likely it will affect her BP. However, I recommended frequent BP monitoring while on meds at home and she notices any changes to d/c the topical med. She verbalized understanding.

## 2017-08-25 DIAGNOSIS — I639 Cerebral infarction, unspecified: Secondary | ICD-10-CM

## 2017-08-25 HISTORY — DX: Cerebral infarction, unspecified: I63.9

## 2017-09-07 ENCOUNTER — Other Ambulatory Visit: Payer: Self-pay | Admitting: Family Medicine

## 2017-09-07 DIAGNOSIS — I10 Essential (primary) hypertension: Secondary | ICD-10-CM

## 2017-09-15 ENCOUNTER — Emergency Department (HOSPITAL_COMMUNITY): Payer: BLUE CROSS/BLUE SHIELD

## 2017-09-15 ENCOUNTER — Inpatient Hospital Stay (HOSPITAL_COMMUNITY)
Admission: EM | Admit: 2017-09-15 | Discharge: 2017-09-18 | DRG: 041 | Disposition: A | Discharge status: Home / Self Care | Payer: BLUE CROSS/BLUE SHIELD | Attending: Family Medicine | Admitting: Family Medicine

## 2017-09-15 ENCOUNTER — Encounter (HOSPITAL_COMMUNITY): Payer: Self-pay

## 2017-09-15 ENCOUNTER — Other Ambulatory Visit: Payer: Self-pay

## 2017-09-15 ENCOUNTER — Ambulatory Visit (INDEPENDENT_AMBULATORY_CARE_PROVIDER_SITE_OTHER)
Admission: EM | Admit: 2017-09-15 | Discharge: 2017-09-15 | Disposition: A | Discharge status: Home / Self Care | Payer: BLUE CROSS/BLUE SHIELD | Source: Home / Self Care

## 2017-09-15 DIAGNOSIS — K449 Diaphragmatic hernia without obstruction or gangrene: Secondary | ICD-10-CM | POA: Diagnosis present

## 2017-09-15 DIAGNOSIS — Z85038 Personal history of other malignant neoplasm of large intestine: Secondary | ICD-10-CM | POA: Diagnosis not present

## 2017-09-15 DIAGNOSIS — Z923 Personal history of irradiation: Secondary | ICD-10-CM | POA: Diagnosis not present

## 2017-09-15 DIAGNOSIS — F17210 Nicotine dependence, cigarettes, uncomplicated: Secondary | ICD-10-CM | POA: Diagnosis present

## 2017-09-15 DIAGNOSIS — R32 Unspecified urinary incontinence: Secondary | ICD-10-CM | POA: Diagnosis present

## 2017-09-15 DIAGNOSIS — G8929 Other chronic pain: Secondary | ICD-10-CM | POA: Diagnosis present

## 2017-09-15 DIAGNOSIS — R29898 Other symptoms and signs involving the musculoskeletal system: Secondary | ICD-10-CM

## 2017-09-15 DIAGNOSIS — E039 Hypothyroidism, unspecified: Secondary | ICD-10-CM | POA: Diagnosis present

## 2017-09-15 DIAGNOSIS — Z833 Family history of diabetes mellitus: Secondary | ICD-10-CM | POA: Diagnosis not present

## 2017-09-15 DIAGNOSIS — R270 Ataxia, unspecified: Secondary | ICD-10-CM | POA: Diagnosis present

## 2017-09-15 DIAGNOSIS — E785 Hyperlipidemia, unspecified: Secondary | ICD-10-CM

## 2017-09-15 DIAGNOSIS — I639 Cerebral infarction, unspecified: Secondary | ICD-10-CM | POA: Diagnosis not present

## 2017-09-15 DIAGNOSIS — Z9049 Acquired absence of other specified parts of digestive tract: Secondary | ICD-10-CM

## 2017-09-15 DIAGNOSIS — I6340 Cerebral infarction due to embolism of unspecified cerebral artery: Secondary | ICD-10-CM | POA: Diagnosis not present

## 2017-09-15 DIAGNOSIS — E876 Hypokalemia: Secondary | ICD-10-CM | POA: Diagnosis not present

## 2017-09-15 DIAGNOSIS — Z8673 Personal history of transient ischemic attack (TIA), and cerebral infarction without residual deficits: Secondary | ICD-10-CM | POA: Insufficient documentation

## 2017-09-15 DIAGNOSIS — Z79890 Hormone replacement therapy (postmenopausal): Secondary | ICD-10-CM | POA: Diagnosis not present

## 2017-09-15 DIAGNOSIS — Z89422 Acquired absence of other left toe(s): Secondary | ICD-10-CM | POA: Diagnosis not present

## 2017-09-15 DIAGNOSIS — R531 Weakness: Secondary | ICD-10-CM | POA: Diagnosis present

## 2017-09-15 DIAGNOSIS — I10 Essential (primary) hypertension: Secondary | ICD-10-CM | POA: Diagnosis present

## 2017-09-15 DIAGNOSIS — E0500 Thyrotoxicosis with diffuse goiter without thyrotoxic crisis or storm: Secondary | ICD-10-CM | POA: Diagnosis present

## 2017-09-15 DIAGNOSIS — I63411 Cerebral infarction due to embolism of right middle cerebral artery: Secondary | ICD-10-CM | POA: Diagnosis present

## 2017-09-15 DIAGNOSIS — N179 Acute kidney failure, unspecified: Secondary | ICD-10-CM | POA: Diagnosis present

## 2017-09-15 DIAGNOSIS — Z800 Family history of malignant neoplasm of digestive organs: Secondary | ICD-10-CM | POA: Diagnosis not present

## 2017-09-15 DIAGNOSIS — I351 Nonrheumatic aortic (valve) insufficiency: Secondary | ICD-10-CM | POA: Diagnosis not present

## 2017-09-15 DIAGNOSIS — E669 Obesity, unspecified: Secondary | ICD-10-CM | POA: Diagnosis present

## 2017-09-15 DIAGNOSIS — Z90710 Acquired absence of both cervix and uterus: Secondary | ICD-10-CM | POA: Diagnosis not present

## 2017-09-15 DIAGNOSIS — I6310 Cerebral infarction due to embolism of unspecified precerebral artery: Secondary | ICD-10-CM

## 2017-09-15 DIAGNOSIS — Z79899 Other long term (current) drug therapy: Secondary | ICD-10-CM | POA: Diagnosis not present

## 2017-09-15 DIAGNOSIS — Z6831 Body mass index (BMI) 31.0-31.9, adult: Secondary | ICD-10-CM | POA: Diagnosis not present

## 2017-09-15 DIAGNOSIS — F17200 Nicotine dependence, unspecified, uncomplicated: Secondary | ICD-10-CM | POA: Diagnosis not present

## 2017-09-15 DIAGNOSIS — Z823 Family history of stroke: Secondary | ICD-10-CM | POA: Diagnosis not present

## 2017-09-15 DIAGNOSIS — K219 Gastro-esophageal reflux disease without esophagitis: Secondary | ICD-10-CM | POA: Diagnosis present

## 2017-09-15 DIAGNOSIS — G8194 Hemiplegia, unspecified affecting left nondominant side: Secondary | ICD-10-CM | POA: Diagnosis present

## 2017-09-15 DIAGNOSIS — I6389 Other cerebral infarction: Secondary | ICD-10-CM | POA: Diagnosis not present

## 2017-09-15 DIAGNOSIS — K5900 Constipation, unspecified: Secondary | ICD-10-CM | POA: Diagnosis not present

## 2017-09-15 DIAGNOSIS — R29701 NIHSS score 1: Secondary | ICD-10-CM | POA: Diagnosis present

## 2017-09-15 DIAGNOSIS — I361 Nonrheumatic tricuspid (valve) insufficiency: Secondary | ICD-10-CM | POA: Diagnosis not present

## 2017-09-15 DIAGNOSIS — H538 Other visual disturbances: Secondary | ICD-10-CM | POA: Diagnosis present

## 2017-09-15 LAB — COMPREHENSIVE METABOLIC PANEL
ALT: 21 U/L (ref 14–54)
AST: 21 U/L (ref 15–41)
Albumin: 4 g/dL (ref 3.5–5.0)
Alkaline Phosphatase: 87 U/L (ref 38–126)
Anion gap: 10 (ref 5–15)
BUN: 12 mg/dL (ref 6–20)
CO2: 24 mmol/L (ref 22–32)
Calcium: 9.7 mg/dL (ref 8.9–10.3)
Chloride: 104 mmol/L (ref 101–111)
Creatinine, Ser: 1.29 mg/dL — ABNORMAL HIGH (ref 0.44–1.00)
GFR calc Af Amer: 53 mL/min — ABNORMAL LOW (ref >60)
GFR calc non Af Amer: 46 mL/min — ABNORMAL LOW (ref >60)
Glucose, Bld: 113 mg/dL — ABNORMAL HIGH (ref 65–99)
Potassium: 3.6 mmol/L (ref 3.5–5.1)
Sodium: 138 mmol/L (ref 135–145)
Total Bilirubin: 0.6 mg/dL (ref 0.3–1.2)
Total Protein: 7.2 g/dL (ref 6.5–8.1)

## 2017-09-15 LAB — LIPID PANEL
Cholesterol: 171 mg/dL (ref 0–200)
HDL: 40 mg/dL — ABNORMAL LOW (ref >40)
LDL Cholesterol: 118 mg/dL — ABNORMAL HIGH (ref 0–99)
Total CHOL/HDL Ratio: 4.3 RATIO
Triglycerides: 63 mg/dL (ref <150)
VLDL: 13 mg/dL (ref 0–40)

## 2017-09-15 LAB — I-STAT CHEM 8, ED
BUN: 13 mg/dL (ref 6–20)
Calcium, Ion: 1.21 mmol/L (ref 1.15–1.40)
Chloride: 105 mmol/L (ref 101–111)
Creatinine, Ser: 1.2 mg/dL — ABNORMAL HIGH (ref 0.44–1.00)
Glucose, Bld: 108 mg/dL — ABNORMAL HIGH (ref 65–99)
HCT: 45 % (ref 36.0–46.0)
Hemoglobin: 15.3 g/dL — ABNORMAL HIGH (ref 12.0–15.0)
Potassium: 3.6 mmol/L (ref 3.5–5.1)
Sodium: 142 mmol/L (ref 135–145)
TCO2: 24 mmol/L (ref 22–32)

## 2017-09-15 LAB — DIFFERENTIAL
Basophils Absolute: 0 10*3/uL (ref 0.0–0.1)
Basophils Relative: 0 %
Eosinophils Absolute: 0.2 10*3/uL (ref 0.0–0.7)
Eosinophils Relative: 2 %
Lymphocytes Relative: 30 %
Lymphs Abs: 2.8 10*3/uL (ref 0.7–4.0)
Monocytes Absolute: 0.5 10*3/uL (ref 0.1–1.0)
Monocytes Relative: 5 %
Neutro Abs: 5.8 10*3/uL (ref 1.7–7.7)
Neutrophils Relative %: 63 %

## 2017-09-15 LAB — CBC
HCT: 43.2 % (ref 36.0–46.0)
Hemoglobin: 14.2 g/dL (ref 12.0–15.0)
MCH: 26.8 pg (ref 26.0–34.0)
MCHC: 32.9 g/dL (ref 30.0–36.0)
MCV: 81.5 fL (ref 78.0–100.0)
Platelets: 249 10*3/uL (ref 150–400)
RBC: 5.3 MIL/uL — ABNORMAL HIGH (ref 3.87–5.11)
RDW: 14 % (ref 11.5–15.5)
WBC: 9.2 10*3/uL (ref 4.0–10.5)

## 2017-09-15 LAB — PROTIME-INR
INR: 1.11
Prothrombin Time: 14.3 seconds (ref 11.4–15.2)

## 2017-09-15 LAB — TSH: TSH: 2.407 u[IU]/mL (ref 0.350–4.500)

## 2017-09-15 LAB — APTT: aPTT: 34 seconds (ref 24–36)

## 2017-09-15 LAB — I-STAT TROPONIN, ED
Troponin i, poc: 0.07 ng/mL (ref 0.00–0.08)
Troponin i, poc: 0.06 ng/mL (ref 0.00–0.08)

## 2017-09-15 LAB — HEMOGLOBIN A1C
Hgb A1c MFr Bld: 6 % — ABNORMAL HIGH (ref 4.8–5.6)
Mean Plasma Glucose: 125.5 mg/dL

## 2017-09-15 LAB — I-STAT BETA HCG BLOOD, ED (MC, WL, AP ONLY): I-stat hCG, quantitative: <5 m[IU]/mL (ref <5)

## 2017-09-15 LAB — D-DIMER, QUANTITATIVE (NOT AT ARMC): D-Dimer, Quant: 0.54 ug/mL-FEU — ABNORMAL HIGH (ref 0.00–0.50)

## 2017-09-15 LAB — T4, FREE: Free T4: 1.18 ng/dL — ABNORMAL HIGH (ref 0.61–1.12)

## 2017-09-15 MED ORDER — ASPIRIN 81 MG PO CHEW
81.0000 mg | CHEWABLE_TABLET | Freq: Every day | ORAL | Status: DC
  Administered 2017-09-15 – 2017-09-18 (×4): 81 mg via ORAL
  Filled 2017-09-15 (×4): qty 1

## 2017-09-15 MED ORDER — ACETAMINOPHEN 160 MG/5ML PO SOLN: 650.0000 mg | ORAL | Status: DC | PRN

## 2017-09-15 MED ORDER — ATORVASTATIN CALCIUM 10 MG PO TABS
20.0000 mg | ORAL_TABLET | Freq: Every day | ORAL | Status: DC
  Administered 2017-09-16 – 2017-09-17 (×2): 20 mg via ORAL
  Filled 2017-09-15 (×2): qty 2
  Filled 2017-09-15: qty 1

## 2017-09-15 MED ORDER — SODIUM CHLORIDE 0.9 % IV SOLN
INTRAVENOUS | Status: DC
  Administered 2017-09-15: 50 mL/h via INTRAVENOUS

## 2017-09-15 MED ORDER — IOPAMIDOL (ISOVUE-370) INJECTION 76%
INTRAVENOUS | Status: AC
  Administered 2017-09-15: 50 mL
  Filled 2017-09-15: qty 50

## 2017-09-15 MED ORDER — HYDROMORPHONE HCL 1 MG/ML IJ SOLN
1.0000 mg | Freq: Once | INTRAMUSCULAR | Status: AC
  Administered 2017-09-15: 1 mg via INTRAVENOUS
  Filled 2017-09-15: qty 1

## 2017-09-15 MED ORDER — SODIUM CHLORIDE 0.9 % IV BOLUS (SEPSIS)
500.0000 mL | Freq: Once | INTRAVENOUS | Status: AC
  Administered 2017-09-15: 500 mL via INTRAVENOUS

## 2017-09-15 MED ORDER — LEVOTHYROXINE SODIUM 112 MCG PO TABS
112.0000 ug | ORAL_TABLET | Freq: Every day | ORAL | Status: DC
  Administered 2017-09-16 – 2017-09-18 (×3): 112 ug via ORAL
  Filled 2017-09-15 (×3): qty 1

## 2017-09-15 MED ORDER — CYCLOSPORINE 0.05 % OP EMUL
1.0000 [drp] | Freq: Two times a day (BID) | OPHTHALMIC | Status: DC
  Administered 2017-09-15 – 2017-09-17 (×5): 1 [drp] via OPHTHALMIC
  Filled 2017-09-15 (×7): qty 1

## 2017-09-15 MED ORDER — NICOTINE 14 MG/24HR TD PT24
14.0000 mg | MEDICATED_PATCH | Freq: Every day | TRANSDERMAL | Status: DC
  Administered 2017-09-15 – 2017-09-18 (×4): 14 mg via TRANSDERMAL
  Filled 2017-09-15 (×4): qty 1

## 2017-09-15 MED ORDER — STROKE: EARLY STAGES OF RECOVERY BOOK
Freq: Once | Status: AC
  Administered 2017-09-15: 1
  Filled 2017-09-15: qty 1

## 2017-09-15 MED ORDER — ACETAMINOPHEN 325 MG PO TABS
650.0000 mg | ORAL_TABLET | ORAL | Status: DC | PRN
  Administered 2017-09-15 – 2017-09-18 (×6): 650 mg via ORAL
  Filled 2017-09-15 (×7): qty 2

## 2017-09-15 MED ORDER — ACETAMINOPHEN 650 MG RE SUPP: 650.0000 mg | RECTAL | Status: DC | PRN

## 2017-09-15 NOTE — ED Provider Notes (Signed)
[place]    CSN: [id number] Arrival date & time: 09/15/17  1145     History   Chief Complaint No chief complaint on file.   HPI Teresa [name] is a 57 y.o. female.   57 year old female, with history of hypertension, anemia, hypothyroidism, presenting today due to headache and left arm weakness.  Symptoms started yesterday morning when she woke up.  Patient states that she slept all day yesterday.  States that she is left-handed and has noticed a significant amount of weakness in her left arm.  Complaining of a mild generalized headache.  Denies any facial droop or slurred speech.  The history is provided by the patient.  Extremity Weakness  This is a new problem. The current episode started yesterday. The problem occurs constantly. The problem has not changed since onset.Associated symptoms include headaches. Pertinent negatives include no chest pain, no abdominal pain and no shortness of breath. Nothing aggravates the symptoms. Nothing relieves the symptoms. She has tried nothing for the symptoms. The treatment provided no relief.    Past Medical History:  Diagnosis Date  . Allergy   . Anemia   . Cervical disc disorder with radiculopathy of cervical region 02/10/2016  . Chronic lower back pain   . Diverticulosis   . Gastritis   . GERD (gastroesophageal reflux disease)   . H/O hiatal hernia   . Hiatal hernia   . History of amputation of lesser toe of left foot ([place]) 03/11/2015  . History of colon cancer   . Hypertension   . Hyperthyroidism    hx  . Hypothyroidism   . Migraine headache    "a few times/yr" (03/05/2015)  . Neck strain 02/06/2012  . Osteomyelitis ([place])    [name] 03/05/2015  . Osteomyelitis of toe of left foot ([place]) 03/05/2015  . Palpitations 01/05/2016  . Positive TB test   . Tubular adenoma of colon   . Tubular adenoma of colon     Patient Active Problem List   Diagnosis Date Noted  . History of cholecystectomy 06/27/2017  . Small intestinal  bacterial overgrowth 03/23/2017  . Serous otitis media 01/06/2017  . Paresthesia 01/06/2017  . Hypokalemia 01/06/2017  . EKG, abnormal 01/06/2017  . Constipation 11/04/2016  . Left ankle pain 10/11/2016  . Pain of right heel 10/11/2016  . Cerumen impaction 09/27/2016  . Prediabetes 10/29/2015  . Tendinopathy of left rotator cuff 10/27/2015  . Essential hypertension   . Right shoulder pain 04/15/2014  . Obesity 04/15/2014  . Lateral knee pain, left 08/06/2013  . Allergic rhinitis 07/19/2013  . Thyroid nodule 02/12/2013  . GERD (gastroesophageal reflux disease) 02/06/2012  . Tobacco abuse 09/21/2011  . Hypothyroidism 09/21/2011  . Throat pain 09/21/2011    Past Surgical History:  Procedure Laterality Date  . ABDOMINAL HYSTERECTOMY  2006  . AMPUTATION TOE Left 03/05/2015   Procedure: AMPUTATION LEFT 5TH  TOE;  Surgeon: Wylene Simmer, MD;  Location: [facility];  Service: Orthopedics;  Laterality: Left;  . APPENDECTOMY  2006  . BREAST BIOPSY Left 11/26/2014  . [place]  . COLONOSCOPY    . TOE AMPUTATION Left 03/05/2015   5th toe  . TUBAL LIGATION Bilateral 1990  . UPPER GASTROINTESTINAL ENDOSCOPY      OB History   None      Home Medications    Prior to Admission medications   Medication Sig Start Date End Date Taking? Authorizing Provider  carvedilol (COREG) 6.25 MG tablet Take 1 tablet (  6.25 mg total) by mouth 2 (two) times daily with a meal. 09/27/16 06/28/17  Kinnie Feil, MD  Cyanocobalamin (B-12 PO) Take 1 tablet by mouth daily.     [provider]  cyclobenzaprine (FLEXERIL) 10 MG tablet Take 1 tablet (10 mg total) by mouth 3 (three) times daily as needed for muscle spasms. 06/26/17   Sherwood Gambler, MD  cycloSPORINE (RESTASIS) 0.05 % ophthalmic emulsion Place 1 drop into both eyes 2 (two) times daily.     [provider]  Hypromellose (ARTIFICIAL TEARS OP) Place 1 drop into both eyes as needed (for dry eyes). Reported on 08/13/2015     [provider]  levothyroxine (SYNTHROID, LEVOTHROID) 112 MCG tablet TAKE ONE TABLET BY MOUTH ONCE DAILY BEFORE BREAKFAST 06/08/17   Eniola, Tawanna Solo T, MD  mometasone (NASONEX) 50 MCG/ACT nasal spray Place 2 sprays into the nose daily. Patient not taking: Reported on 06/28/2017 09/27/16   Kinnie Feil, MD  naproxen (NAPROSYN) 500 MG tablet Take 1 tablet (500 mg total) by mouth 2 (two) times daily with a meal. 06/26/17   Sherwood Gambler, MD  oxymetazoline (AFRIN) 0.05 % nasal spray Place 1 spray into both nostrils 2 (two) times daily.    [provider]  pantoprazole (PROTONIX) 40 MG tablet Take 1 tablet (40 mg total) by mouth 2 (two) times daily. Patient not taking: Reported on 06/28/2017 01/25/17   Jerene Bears, MD  spironolactone (ALDACTONE) 25 MG tablet TAKE 1 TABLET BY MOUTH ONCE DAILY 09/07/17   Lind Covert, MD    Family History Family History  Problem Relation Age of Onset  . Heart disease Mother        CAB age 75  . Diabetes Mother   . Cancer Father 48       pancreatic cancer  . Stroke Father   . Diabetes Sister   . Kidney disease Sister        renal failure  . Diabetes Brother   . Kidney disease Brother        renal failure  . Heart disease Other   . Diabetes Other   . Breast cancer Maternal Aunt        per pt aunts and uncles died of cancer not sure the type  . Breast cancer Paternal Aunt   . Esophageal cancer Neg Hx   . Rectal cancer Neg Hx   . Stomach cancer Neg Hx     Social History Social History   Tobacco Use  . Smoking status: Current Every Day Smoker    Packs/day: 0.50    Years: 36.00    Pack years: 18.00    Types: Cigarettes  . Smokeless tobacco: Never Used  . Tobacco comment: Tobacco info given 03/01/17  Substance Use Topics  . Alcohol use: No  . Drug use: No     Allergies   Bactrim [sulfamethoxazole-trimethoprim]; Clonidine derivatives; Aspirin; and Latex   Review of Systems Review of Systems  Constitutional:  Negative for chills and fever.  HENT: Negative for ear pain and sore throat.   Eyes: Negative for pain and visual disturbance.  Respiratory: Negative for cough and shortness of breath.   Cardiovascular: Negative for chest pain and palpitations.  Gastrointestinal: Negative for abdominal pain and vomiting.  Genitourinary: Negative for dysuria and hematuria.  Musculoskeletal: Positive for extremity weakness. Negative for arthralgias and back pain.  Skin: Negative for color change and rash.  Neurological: Positive for weakness and headaches. Negative for seizures and syncope.  All other systems reviewed and are negative.    Physical Exam Triage Vital Signs ED Triage Vitals  Enc Vitals Group     BP      Pulse      Resp      Temp      Temp src      SpO2      Weight      Height      Head Circumference      Peak Flow      Pain Score      Pain Loc      Pain Edu?      Excl. in [place]?    No data found.  Updated Vital Signs There were no vitals taken for this visit.  Visual Acuity Right Eye Distance:   Left Eye Distance:   Bilateral Distance:    Right Eye Near:   Left Eye Near:    Bilateral Near:     Physical Exam  Constitutional: She is oriented to person, place, and time. She appears well-developed and well-nourished. No distress.  HENT:  Head: Normocephalic and atraumatic.  Eyes: Pupils are equal, round, and reactive to light. Conjunctivae and EOM are normal.  Neck: Normal range of motion. Neck supple.  Cardiovascular: Normal rate and regular rhythm.  No murmur heard. Pulmonary/Chest: Effort normal and breath sounds normal. No respiratory distress.  Abdominal: Soft. There is no tenderness.  Musculoskeletal: Normal range of motion. She exhibits no edema.  Neurological: She is alert and oriented to person, place, and time. She has normal reflexes. No cranial nerve deficit or sensory deficit.  No facial droop or slurred speech No nystagmus Decreased strength to the left  upper extremity Normal strength and sensation to the lower extremities bilaterally  Skin: Skin is warm and dry. She is not diaphoretic.  Psychiatric: She has a normal mood and affect.  Nursing note and vitals reviewed.    UC Treatments / Results  Labs (all labs ordered are listed, but only abnormal results are displayed) Labs Reviewed - No data to display  EKG  EKG Interpretation None       Radiology No results found.  Procedures Procedures (including critical care time)  Medications Ordered in UC Medications - No data to display   Initial Impression / Assessment and Plan / UC Course  I have reviewed the triage vital signs and the nursing notes.  Pertinent labs & imaging results that were available during my care of the patient were reviewed by me and considered in my medical decision making (see chart for details).     Patient here today due to headache and left arm weakness that started yesterday morning.  Patient is noted to be hypertensive.  Patient is outside of the window but still sent to the emergency department due to stroke symptoms.  Final Clinical Impressions(s) / UC Diagnoses   Final diagnoses:  Left arm weakness    ED Discharge Orders    None       Controlled Substance Prescriptions [place] Controlled Substance Registry consulted? Not Applicable   Phebe Colla, [place] 09/15/17 1158

## 2017-09-15 NOTE — ED Triage Notes (Addendum)
Pt presents for evaluation of L arm pain and weakness and headache starting yesterday. Pt was sent by [facility] for further eval. Pt BP elevated at [facility].

## 2017-09-15 NOTE — ED Notes (Signed)
Pt transported to MRI 

## 2017-09-15 NOTE — ED Notes (Signed)
Admitting MD came by and updated that pt passed swallow screen earlier and needs a diet order.  States he will put order in.  Pt given [place] sandwich and drink.

## 2017-09-15 NOTE — ED Triage Notes (Signed)
Per Minette Brine, pt needs to go to ER due to stroke symptoms. LSN yesterday when she woke up. L sided arm weakness and headache, hypertensive. Pt agreeable to plan, went herself to the ER.

## 2017-09-15 NOTE — ED Notes (Signed)
Report from [place], [place].  PT in CT.

## 2017-09-15 NOTE — ED Provider Notes (Signed)
[facility] EMERGENCY DEPARTMENT Provider Note   CSN: [id number] Arrival date & time: 09/15/17  1202     History   Chief Complaint Chief Complaint  Patient presents with  . Arm Pain  . Headache    HPI Teresa [name] is a 57 y.o. female.  Patient states she awoke yesterday morning with a bad headache and also weakness in the left arm.  She continues to have weakness in that arm  The history is provided by the patient. No language interpreter was used.  Weakness  Primary symptoms include focal weakness. This is a new problem. The current episode started yesterday. The problem has not changed since onset.There was left upper extremity focality noted. There has been no fever. Pertinent negatives include no shortness of breath, no chest pain and no headaches. There were no medications administered prior to arrival. Associated medical issues do not include trauma.    Past Medical History:  Diagnosis Date  . Allergy   . Anemia   . Cervical disc disorder with radiculopathy of cervical region 02/10/2016  . Chronic lower back pain   . Diverticulosis   . Gastritis   . GERD (gastroesophageal reflux disease)   . H/O hiatal hernia   . Hiatal hernia   . History of amputation of lesser toe of left foot ([place]) 03/11/2015  . History of colon cancer   . Hypertension   . Hyperthyroidism    hx  . Hypothyroidism   . Migraine headache    "a few times/yr" (03/05/2015)  . Neck strain 02/06/2012  . Osteomyelitis ([place])    [name] 03/05/2015  . Osteomyelitis of toe of left foot ([place]) 03/05/2015  . Palpitations 01/05/2016  . Positive TB test   . Tubular adenoma of colon   . Tubular adenoma of colon     Patient Active Problem List   Diagnosis Date Noted  . History of cholecystectomy 06/27/2017  . Small intestinal bacterial overgrowth 03/23/2017  . Serous otitis media 01/06/2017  . Paresthesia 01/06/2017  . Hypokalemia 01/06/2017  . EKG, abnormal 01/06/2017  . Constipation  11/04/2016  . Left ankle pain 10/11/2016  . Pain of right heel 10/11/2016  . Cerumen impaction 09/27/2016  . Prediabetes 10/29/2015  . Tendinopathy of left rotator cuff 10/27/2015  . Essential hypertension   . Right shoulder pain 04/15/2014  . Obesity 04/15/2014  . Lateral knee pain, left 08/06/2013  . Allergic rhinitis 07/19/2013  . Thyroid nodule 02/12/2013  . GERD (gastroesophageal reflux disease) 02/06/2012  . Tobacco abuse 09/21/2011  . Hypothyroidism 09/21/2011  . Throat pain 09/21/2011    Past Surgical History:  Procedure Laterality Date  . ABDOMINAL HYSTERECTOMY  2006  . AMPUTATION TOE Left 03/05/2015   Procedure: AMPUTATION LEFT 5TH  TOE;  Surgeon: Wylene Simmer, MD;  Location: [facility];  Service: Orthopedics;  Laterality: Left;  . APPENDECTOMY  2006  . BREAST BIOPSY Left 11/26/2014  . [place]  . COLONOSCOPY    . TOE AMPUTATION Left 03/05/2015   5th toe  . TUBAL LIGATION Bilateral 1990  . UPPER GASTROINTESTINAL ENDOSCOPY       OB History   None      Home Medications    Prior to Admission medications   Medication Sig Start Date End Date Taking? Authorizing Provider  carvedilol (COREG) 6.25 MG tablet Take 1 tablet (6.25 mg total) by mouth 2 (two) times daily with a meal. 09/27/16 06/28/17  Kinnie Feil, MD  Cyanocobalamin (B-12  PO) Take 1 tablet by mouth daily.     [provider]  cyclobenzaprine (FLEXERIL) 10 MG tablet Take 1 tablet (10 mg total) by mouth 3 (three) times daily as needed for muscle spasms. 06/26/17   Sherwood Gambler, MD  cycloSPORINE (RESTASIS) 0.05 % ophthalmic emulsion Place 1 drop into both eyes 2 (two) times daily.     [provider]  Hypromellose (ARTIFICIAL TEARS OP) Place 1 drop into both eyes as needed (for dry eyes). Reported on 08/13/2015    [provider]  levothyroxine (SYNTHROID, LEVOTHROID) 112 MCG tablet TAKE ONE TABLET BY MOUTH ONCE DAILY BEFORE BREAKFAST 06/08/17   Eniola, Tawanna Solo T,  MD  mometasone (NASONEX) 50 MCG/ACT nasal spray Place 2 sprays into the nose daily. Patient not taking: Reported on 06/28/2017 09/27/16   Kinnie Feil, MD  naproxen (NAPROSYN) 500 MG tablet Take 1 tablet (500 mg total) by mouth 2 (two) times daily with a meal. 06/26/17   Sherwood Gambler, MD  oxymetazoline (AFRIN) 0.05 % nasal spray Place 1 spray into both nostrils 2 (two) times daily.    [provider]  pantoprazole (PROTONIX) 40 MG tablet Take 1 tablet (40 mg total) by mouth 2 (two) times daily. Patient not taking: Reported on 06/28/2017 01/25/17   Jerene Bears, MD  spironolactone (ALDACTONE) 25 MG tablet TAKE 1 TABLET BY MOUTH ONCE DAILY 09/07/17   Lind Covert, MD    Family History Family History  Problem Relation Age of Onset  . Heart disease Mother        CAB age 66  . Diabetes Mother   . Cancer Father 60       pancreatic cancer  . Stroke Father   . Diabetes Sister   . Kidney disease Sister        renal failure  . Diabetes Brother   . Kidney disease Brother        renal failure  . Heart disease Other   . Diabetes Other   . Breast cancer Maternal Aunt        per pt aunts and uncles died of cancer not sure the type  . Breast cancer Paternal Aunt   . Esophageal cancer Neg Hx   . Rectal cancer Neg Hx   . Stomach cancer Neg Hx     Social History Social History   Tobacco Use  . Smoking status: Current Every Day Smoker    Packs/day: 0.50    Years: 36.00    Pack years: 18.00    Types: Cigarettes  . Smokeless tobacco: Never Used  . Tobacco comment: Tobacco info given 03/01/17  Substance Use Topics  . Alcohol use: No  . Drug use: No     Allergies   Bactrim [sulfamethoxazole-trimethoprim]; Clonidine derivatives; Aspirin; and Latex   Review of Systems Review of Systems  Constitutional: Negative for appetite change and fatigue.  HENT: Negative for congestion, ear discharge and sinus pressure.   Eyes: Negative for discharge.  Respiratory: Negative for  cough and shortness of breath.   Cardiovascular: Negative for chest pain.  Gastrointestinal: Negative for abdominal pain and diarrhea.  Genitourinary: Negative for frequency and hematuria.  Musculoskeletal: Negative for back pain.  Skin: Negative for rash.  Neurological: Positive for focal weakness and weakness. Negative for seizures and headaches.  Psychiatric/Behavioral: Negative for hallucinations.     Physical Exam Updated Vital Signs BP (!) 200/104   Pulse 68   Temp 98.4 F (36.9 C) (Oral)   Resp 17  SpO2 98%   Physical Exam  Constitutional: She is oriented to person, place, and time. She appears well-developed.  HENT:  Head: Normocephalic.  Eyes: Conjunctivae and EOM are normal. No scleral icterus.  Neck: Neck supple. No thyromegaly present.  Cardiovascular: Normal rate and regular rhythm. Exam reveals no gallop and no friction rub.  No murmur heard. Pulmonary/Chest: No stridor. She has no wheezes. She has no rales. She exhibits no tenderness.  Abdominal: She exhibits no distension. There is no tenderness. There is no rebound.  Musculoskeletal: Normal range of motion. She exhibits no edema.  Patient has poor coordination of the left arm  Lymphadenopathy:    She has no cervical adenopathy.  Neurological: She is oriented to person, place, and time. She exhibits normal muscle tone. Coordination normal.  Skin: No rash noted. No erythema.  Psychiatric: She has a normal mood and affect. Her behavior is normal.     ED Treatments / Results  Labs (all labs ordered are listed, but only abnormal results are displayed) Labs Reviewed  CBC - Abnormal; Notable for the following components:      Result Value   RBC 5.30 (*)    All other components within normal limits  COMPREHENSIVE METABOLIC PANEL - Abnormal; Notable for the following components:   Glucose, Bld 113 (*)    Creatinine, Ser 1.29 (*)    GFR calc non Af Amer 46 (*)    GFR calc Af Amer 53 (*)    All other  components within normal limits  D-DIMER, QUANTITATIVE (NOT AT [facility]) - Abnormal; Notable for the following components:   D-Dimer, Quant 0.54 (*)    All other components within normal limits  I-STAT CHEM 8, ED - Abnormal; Notable for the following components:   Creatinine, Ser 1.20 (*)    Glucose, Bld 108 (*)    Hemoglobin 15.3 (*)    All other components within normal limits  PROTIME-INR  APTT  DIFFERENTIAL  LIPID PANEL  HEMOGLOBIN A1C  TSH  T4, FREE  BASIC METABOLIC PANEL  I-STAT TROPONIN, ED  I-STAT BETA HCG BLOOD, ED ([facility], [facility], [facility] ONLY)  I-STAT TROPONIN, ED    EKG None  Radiology Dg Chest 2 View  Result Date: 09/15/2017 CLINICAL DATA:  Chest pain EXAM: CHEST - 2 VIEW COMPARISON:  05/22/2014 FINDINGS: The heart size and mediastinal contours are within normal limits. Both lungs are clear. The visualized skeletal structures are unremarkable. IMPRESSION: No active cardiopulmonary disease. Electronically Signed   By: Donavan Foil M.D.   On: 09/15/2017 14:47   Ct Head Wo Contrast  Result Date: 09/15/2017 CLINICAL DATA:  Headache and left arm pain for 24 hours. EXAM: CT HEAD WITHOUT CONTRAST TECHNIQUE: Contiguous axial images were obtained from the base of the skull through the vertex without intravenous contrast. COMPARISON:  03/23/2012 FINDINGS: Brain: No evidence of acute infarction, hemorrhage, hydrocephalus, extra-axial collection or mass lesion/mass effect. Vascular: No hyperdense vessel or unexpected calcification. Skull: Normal. Negative for fracture or focal lesion. Sinuses/Orbits: Mild ethmoid sinusitis. The remainder of the paranasal sinuses and mastoid air cells are normal. Other: None. IMPRESSION: No acute intracranial abnormality. Mild ethmoid sinusitis. Electronically Signed   By: Fidela Salisbury M.D.   On: 09/15/2017 12:53   Mr Brain Wo Contrast  Result Date: 09/15/2017 CLINICAL DATA:  Stroke.  Left arm weakness.  Headache, hypertension EXAM: MRI HEAD WITHOUT  CONTRAST TECHNIQUE: Multiplanar, multiecho pulse sequences of the brain and surrounding structures were obtained without intravenous contrast. COMPARISON:  CT head  09/15/2017 FINDINGS: Brain: Multiple small areas of acute infarct in the right posterior frontal lobe and parietal lobe. Small acute infarcts in the left occipital pole and left lateral inferior cerebellum. Ventricle size is normal. Mild chronic white matter changes. Chronic microhemorrhage right posterior temporal lobe, and right thalamus. Negative for mass or edema or midline shift. Vascular: Normal arterial flow void Skull and upper cervical spine: Negative Sinuses/Orbits: Mild mucosal edema paranasal sinuses.  Normal orbit. Other: None IMPRESSION: Multiple small acute infarcts involving the right MCA territory, left PCA territory, and left PICA territory. Findings suggest multiple emboli. Chronic microhemorrhage right posterior temporal lobe and right thalamus possibly due to hypertension. Electronically Signed   By: Franchot Gallo M.D.   On: 09/15/2017 16:21    Procedures Procedures (including critical care time)  Medications Ordered in ED Medications  sodium chloride 0.9 % bolus 500 mL (has no administration in time range)  0.9 %  sodium chloride infusion (has no administration in time range)  nicotine (NICODERM CQ - dosed in mg/24 hours) patch 14 mg (has no administration in time range)  aspirin chewable tablet 81 mg (has no administration in time range)  atorvastatin (LIPITOR) tablet 20 mg (has no administration in time range)  iopamidol (ISOVUE-370) 76 % injection (has no administration in time range)  HYDROmorphone (DILAUDID) injection 1 mg (1 mg Intravenous Given 09/15/17 1517)     Initial Impression / Assessment and Plan / ED Course  I have reviewed the triage vital signs and the nursing notes.  Pertinent labs & imaging results that were available during my care of the patient were reviewed by me and considered in my  medical decision making (see chart for details).     MRI shows new stroke.  Patient was seen by neurology who recommend admission by medicine and continued stroke workup in the hospital  Final Clinical Impressions(s) / ED Diagnoses   Final diagnoses:  Weakness    ED Discharge Orders    None       Milton Ferguson, MD 09/15/17 1756

## 2017-09-15 NOTE — ED Notes (Signed)
Message sent to pharmacy for Lipitor that has not been received.

## 2017-09-15 NOTE — ED Notes (Signed)
Pt ambulated to RR with steady gait NAD 

## 2017-09-15 NOTE — Consult Note (Addendum)
NEUROHOSPITALISTS - STROKE CONSULTATION NOTE   Requesting Physician: Dr. Milton Ferguson [facility]: Dr. Amie Portland  Admit date: 09/15/2017    Chief Complaint: Left arm weakness, H/A and blurred vision  History obtained from:  Patient and Chart     HPI   Teresa [name] is an 57 y.o. female with a PMH of PreDM, Hx of Graves disease with a recent Synthroid dosage increase, Hx of Migraines, Colon cancer and active smoker who presents to the ED for complaints of H/A since Tuesday 09/12/2017. She states her H/A started out very mild on Tuesday, she woke up on Wednesday and experienced an extremely painful H/A the moment she woke up. Her symptoms progressed to include Left sided weakness, blurred vision and ataxia. She did not seek medical attention until today. Her MRI shows multiple strokes bilaterally with a watershed appearance. Her B/P's in the ED are elevated and she says that this is not normal for her. She denies any recent illnesses, denies any N/V/D.     Date last known well: Date: 09/12/2017 Time last known well: Unable to determine -at best, 7 AM on 09/14/2017 tPA Given: No: Outside the window Modified Rankin: Rankin Score=0 NIH Stroke Scale: 1  Past Medical History    Past Medical History:  Diagnosis Date  . Allergy   . Anemia   . Cervical disc disorder with radiculopathy of cervical region 02/10/2016  . Chronic lower back pain   . Diverticulosis   . Gastritis   . GERD (gastroesophageal reflux disease)   . H/O hiatal hernia   . Hiatal hernia   . History of amputation of lesser toe of left foot ([place]) 03/11/2015  . History of colon cancer   . Hypertension   . Hyperthyroidism    hx  . Hypothyroidism   . Migraine headache    "a few times/yr" (03/05/2015)  . Neck strain 02/06/2012  . Osteomyelitis ([place])    [name] 03/05/2015  . Osteomyelitis of toe of left foot ([place]) 03/05/2015  . Palpitations  01/05/2016  . Positive TB test   . Tubular adenoma of colon   . Tubular adenoma of colon    Past Surgical History   Past Surgical History:  Procedure Laterality Date  . ABDOMINAL HYSTERECTOMY  2006  . AMPUTATION TOE Left 03/05/2015   Procedure: AMPUTATION LEFT 5TH  TOE;  Surgeon: Wylene Simmer, MD;  Location: [facility];  Service: Orthopedics;  Laterality: Left;  . APPENDECTOMY  2006  . BREAST BIOPSY Left 11/26/2014  . [place]  . COLONOSCOPY    . TOE AMPUTATION Left 03/05/2015   5th toe  . TUBAL LIGATION Bilateral 1990  . UPPER GASTROINTESTINAL ENDOSCOPY     Family History   Family History  Problem Relation Age of Onset  . Heart disease Mother        CAB age 64  . Diabetes Mother   . Cancer Father 38       pancreatic cancer  . Stroke Father   . Diabetes Sister   . Kidney disease Sister        renal failure  . Diabetes Brother   . Kidney disease Brother        renal failure  . Heart disease Other   . Diabetes Other   . Breast cancer Maternal Aunt        per pt aunts and uncles died of cancer not sure the type  . Breast cancer Paternal Aunt   . Esophageal  cancer Neg Hx   . Rectal cancer Neg Hx   . Stomach cancer Neg Hx    Social History   reports that she has been smoking cigarettes.  She has a 18.00 pack-year smoking history. She has never used smokeless tobacco. She reports that she does not drink alcohol or use drugs.  Allergies   Allergies  Allergen Reactions  . Bactrim [Sulfamethoxazole-Trimethoprim] Anaphylaxis  . Clonidine Derivatives Swelling and Other (See Comments)    Facial swelling  . Aspirin Nausea Only  . Latex Rash   Medications Prior to Admission   No current facility-administered medications on file prior to encounter.    Current Outpatient Medications on File Prior to Encounter  Medication Sig Dispense Refill  . carvedilol (COREG) 6.25 MG tablet Take 1 tablet (6.25 mg total) by mouth 2 (two) times daily with a meal. 180  tablet 2  . Cyanocobalamin (B-12 PO) Take 1 tablet by mouth daily.     . cyclobenzaprine (FLEXERIL) 10 MG tablet Take 1 tablet (10 mg total) by mouth 3 (three) times daily as needed for muscle spasms. 15 tablet 0  . cycloSPORINE (RESTASIS) 0.05 % ophthalmic emulsion Place 1 drop into both eyes 2 (two) times daily.     . Hypromellose (ARTIFICIAL TEARS OP) Place 1 drop into both eyes as needed (for dry eyes). Reported on 08/13/2015    . levothyroxine (SYNTHROID, LEVOTHROID) 112 MCG tablet TAKE ONE TABLET BY MOUTH ONCE DAILY BEFORE BREAKFAST 90 tablet 1  . mometasone (NASONEX) 50 MCG/ACT nasal spray Place 2 sprays into the nose daily. (Patient not taking: Reported on 06/28/2017) 17 g 12  . naproxen (NAPROSYN) 500 MG tablet Take 1 tablet (500 mg total) by mouth 2 (two) times daily with a meal. 14 tablet 0  . oxymetazoline (AFRIN) 0.05 % nasal spray Place 1 spray into both nostrils 2 (two) times daily.    . pantoprazole (PROTONIX) 40 MG tablet Take 1 tablet (40 mg total) by mouth 2 (two) times daily. (Patient not taking: Reported on 06/28/2017) 60 tablet 2  . spironolactone (ALDACTONE) 25 MG tablet TAKE 1 TABLET BY MOUTH ONCE DAILY 60 tablet 0    ROS  History obtained from the patient General ROS: positive for H/A negative for - chills, fatigue, fever, night sweats, weight gain or weight loss Psychological ROS: negative for - behavioral disorder, hallucinations, memory difficulties, mood swings or suicidal ideation Ophthalmic ROS: positive for - blurry vision, Denies any double vision, eye pain or loss of vision ENT ROS: negative for - epistaxis, nasal discharge, oral lesions, sore throat, tinnitus or vertigo Allergy and Immunology ROS: negative for - hives or itchy/watery eyes Hematological and Lymphatic ROS: negative for - bleeding problems, bruising or swollen lymph nodes Endocrine ROS: negative for - galactorrhea, hair pattern changes, polydipsia/polyuria or temperature intolerance Respiratory ROS:  negative for - cough, hemoptysis, shortness of breath or wheezing Cardiovascular ROS: negative for - chest pain, dyspnea on exertion, edema or irregular heartbeat Gastrointestinal ROS: negative for - abdominal pain, diarrhea, hematemesis, nausea/vomiting or stool incontinence Genito-Urinary ROS: negative for - dysuria, hematuria, incontinence or urinary frequency/urgency Musculoskeletal ROS: positive for chronic Neck strain negative for - joint swelling or muscular weakness Neurological ROS: as noted in HPI Dermatological ROS: positive for area of "soreness" under right arm negative for rash and skin lesion changes  Physical Examination   Vitals:   09/15/17 1500 09/15/17 1515 09/15/17 1630 09/15/17 1645  BP: (!) 185/94 (!) 191/89 (!) 197/97 (!) 203/102  Pulse: 63  67 65 68  Resp: 13 15 18 11   Temp:      TempSrc:      SpO2: 100% 97% 99% 98%   HEENT-  Normocephalic,  Cardiovascular - Regular rate and rhythm  Respiratory -Non-labored breathing, No wheezing. Abdomen - soft and non-tender, BS normal Extremities- no edema or cyanosis Skin-warm and dry  Neurological Examination  Mental Status: Alert, oriented, thought content appropriate.  Speech fluent without evidence of aphasia or neglect.  Able to follow 3 step commands without difficulty. Cranial Nerves: II: Visual Fields are full. Pupils are equal, round, and reactive to light. +Right eye exotropia at baseline.  III,IV, VI: EOMI without ptosis or diploplia. No nystagmus on exam   V: Facial sensation is symmetric to temperature VII: Facial movement is symmetric.  VIII: hearing is intact to voice X: Uvula elevates symmetrically XI: Shoulder shrug is symmetric. XII: tongue is midline without atrophy or fasciculations.  Motor: Tone is normal. Bulk is normal. 5/5 strength was present in all four extremities.  Sensor: Sensation is symmetric to light touch and temperature in the arms and legs. Deep Tendon Reflexes: 2+ and symmetric  throughout in the biceps and patellae Plantars: Toes are downgoing bilaterally.  Cerebellar: slightly ataxic with finger-to-nose on Left, and normal heel-to-shin test bilaterally Gait: not tested  Laboratory Results  CBC: Recent Labs  Lab 09/15/17 1215 09/15/17 1228  WBC 9.2  --   HGB 14.2 15.3*  HCT 43.2 45.0  MCV 81.5  --   PLT 249  --    Basic Metabolic Panel: Recent Labs  Lab 09/15/17 1215 09/15/17 1228  NA 138 142  K 3.6 3.6  CL 104 105  CO2 24  --   GLUCOSE 113* 108*  BUN 12 13  CREATININE 1.29* 1.20*  CALCIUM 9.7  --      IMPRESSION AND PLAN  Teresa [name] is a 57 y.o. female with PMH of PreDM, Hx of Graves, Hx of Migraines, Colon cancer and active smoker who presents to the ED for complaints of H/A, blurred vision and Left arm weakness since Tuesday 09/12/2017. MRI imaging reveals:  Multiple small acute infarcts involving the Right MCA territory, Left PCA territory, and Left PICA territory, likely multiple emboli  Suspected Etiology: Likely AFIB vs small vessel Resultant Symptoms:  Left sided weakness, blurred vision and ataxia Stroke Risk Factors: diabetes mellitus, hyperlipidemia, hypertension and smoking, Other Stroke Risk Factors: Advanced age, Cigarette smoker,Family hx stroke, Migraines, Hypothyroidism, History of palpitations, Hx of Colon Cancer  Outstanding Stroke Work-up Studies:     CTA of Head and Neck                                          PENDING Echocardiogram:                                                    PENDING  PLAN  09/15/2017  Patient will be admitted to the hospitalists and stroke workup will be obtained. Frequent Neurochecks  Telemetry Monitoring NPO until passes Stroke swallow screen ADD Aspirin/ Statin HgbA1C and Lipid Profile PT/OT/SLP Consult Case Management /MSW May need Outpatient TEE and Loop Recorder Placement Ongoing aggressive stroke risk factor management Patient will be counseled to be  compliant with her  antithrombotic medications Patient will be counseled on Lifestyle modifications including, Diet, Exercise, and Stress Follow up with [facility] in 6 weeks  NEUROLOGICAL  ISSUES   Chronic microhemorrhage Right posterior temporal lobe and Right thalamus possibly due to hypertension.  MEDICAL ISSUES   Hypothyroidism /Graves on Synthroid with recent dose change Check TSH and Free T4 - primary team to manage  Elevated Creatinine IV Bolus of NS 500 cc, then 50 cc/hr to obtain CTA imaging.  May discontinue at primary team discretion.   R/O AFIB: If A.FIB found on telemetry monitoring, the patient will need [facility] therapy,  Decision will be made pending further imaging and stroke team decision. May need Outpatient TEE and Loop Recorder Placement  Accelerated HYPERTENSION: Stable but elevated Permissive hypertension (OK if <220/120) for 24-48 hours post stroke and then gradually normalized within 5-7 days. Will start patient on Nicardipine drip, Labetolol PRN Long term BP goal normotensive. May slowly restart home B/P medications after 48 hours Home Meds: YES  HYPERLIPIDEMIA: CURRENT LIPID PANEL PENDING    Component Value Date/Time   CHOL 159 10/27/2015 0954   TRIG 37 10/27/2015 0954   HDL 47 10/27/2015 0954   CHOLHDL 3.4 10/27/2015 0954   VLDL 7 10/27/2015 0954   LDLCALC 105 10/27/2015 0954  Home Meds:  NONE LDL  goal < 70 Started on  Lipitor to 20 mg daily, adjust dosage per lipid panel results Continue statin at discharge  PRE- DIABETES: CURRENT HGA1C PENDING Lab Results  Component Value Date   HGBA1C 6.3 06/28/2017  HgbA1c goal < 7.0 Continue CBG monitoring and SSI to maintain glucose 140-180 mg/dl DM education   TOBACCO ABUSE Current smoker Smoking cessation counseling provided Nicotine patch provided    Hospital day # 0 VTE prophylaxis: SCD's  Diet - Diet NPO time specified     FAMILY UPDATES: No family at bedside  TEAM UPDATES:[place],  Broadus John, MD STATUS:    Discharge Information  Prior Home Stroke Medications: No antithrombotic  Discharge Stroke Meds:  Please discharge patient on TBD     Disposition:  Therapy Recs:               PENDING  Follow up Appointments  Follow Up:  Kinnie Feil, MD -PCP Follow up in 1-2 weeks      Assessment and plan discussed with with attending physician and they are in agreement.    Mary Sella, ANP-C [facility] 09/15/2017, 5:30 PM  09/15/2017 ATTENDING ASSESSMENT  Patient seen and examined with APP/Resident. Agree with the history and physical as documented above. Agree with the plan as documented, which I helped formulate. I have independently reviewed the chart, obtained history, review of systems and examined the patient.I have personally reviewed pertinent head/neck/spine imaging (CT/MRI). MRI examination of the brain Scattered embolic-looking infarcts in the right MCA territory and left PCA territory along with left PICA territory. Chronic microhemorrhage in the left posterior temporal lobe and right thalamus Scattered embolic strokes in pt with h/o HTN and Grave's-concerning for cardioembolic versus atheroembolic versus small vessel disease. Need stroke workup as above. Please feel free to call with any questions. --- Amie Portland, MD [facility] Pager: [phone]  If 7pm to 7am, please call on call as listed on AMION.   Please page stroke NP/ PA / MD from 8am -4 pm   You can look them up on [email or web address]  Password [email or web address]

## 2017-09-15 NOTE — ED Notes (Signed)
Neuro at bedside.

## 2017-09-16 ENCOUNTER — Inpatient Hospital Stay (HOSPITAL_COMMUNITY): Payer: BLUE CROSS/BLUE SHIELD

## 2017-09-16 DIAGNOSIS — E785 Hyperlipidemia, unspecified: Secondary | ICD-10-CM

## 2017-09-16 DIAGNOSIS — F17200 Nicotine dependence, unspecified, uncomplicated: Secondary | ICD-10-CM

## 2017-09-16 DIAGNOSIS — I361 Nonrheumatic tricuspid (valve) insufficiency: Secondary | ICD-10-CM

## 2017-09-16 LAB — CBC
HCT: 40 % (ref 36.0–46.0)
Hemoglobin: 12.9 g/dL (ref 12.0–15.0)
MCH: 26.5 pg (ref 26.0–34.0)
MCHC: 32.3 g/dL (ref 30.0–36.0)
MCV: 82.1 fL (ref 78.0–100.0)
Platelets: 233 10*3/uL (ref 150–400)
RBC: 4.87 MIL/uL (ref 3.87–5.11)
RDW: 14.1 % (ref 11.5–15.5)
WBC: 8.9 10*3/uL (ref 4.0–10.5)

## 2017-09-16 LAB — BASIC METABOLIC PANEL
Anion gap: 10 (ref 5–15)
BUN: 13 mg/dL (ref 6–20)
CO2: 24 mmol/L (ref 22–32)
Calcium: 8.7 mg/dL — ABNORMAL LOW (ref 8.9–10.3)
Chloride: 104 mmol/L (ref 101–111)
Creatinine, Ser: 0.95 mg/dL (ref 0.44–1.00)
GFR calc Af Amer: >60 mL/min (ref >60)
GFR calc non Af Amer: >60 mL/min (ref >60)
Glucose, Bld: 102 mg/dL — ABNORMAL HIGH (ref 65–99)
Potassium: 3.3 mmol/L — ABNORMAL LOW (ref 3.5–5.1)
Sodium: 138 mmol/L (ref 135–145)

## 2017-09-16 LAB — ECHOCARDIOGRAM COMPLETE
Height: 65 in
Weight: 3030 oz

## 2017-09-16 LAB — HIV ANTIBODY (ROUTINE TESTING W REFLEX): HIV Screen 4th Generation wRfx: NONREACTIVE

## 2017-09-16 MED ORDER — VITAMIN B-12 100 MCG PO TABS
500.0000 ug | ORAL_TABLET | Freq: Every day | ORAL | Status: DC
  Administered 2017-09-16 – 2017-09-17 (×2): 500 ug via ORAL
  Filled 2017-09-16 (×3): qty 5

## 2017-09-16 MED ORDER — CARVEDILOL 6.25 MG PO TABS
6.2500 mg | ORAL_TABLET | Freq: Two times a day (BID) | ORAL | Status: DC
  Administered 2017-09-16 – 2017-09-17 (×3): 6.25 mg via ORAL
  Filled 2017-09-16 (×3): qty 1

## 2017-09-16 MED ORDER — POTASSIUM CHLORIDE CRYS ER 20 MEQ PO TBCR
40.0000 meq | EXTENDED_RELEASE_TABLET | Freq: Once | ORAL | Status: AC
Start: 2017-09-16 — End: 2017-09-16
  Administered 2017-09-16: 40 meq via ORAL
  Filled 2017-09-16: qty 2

## 2017-09-16 MED ORDER — SIMETHICONE 80 MG PO CHEW
80.0000 mg | CHEWABLE_TABLET | Freq: Three times a day (TID) | ORAL | Status: DC
  Administered 2017-09-16 – 2017-09-18 (×9): 80 mg via ORAL
  Filled 2017-09-16 (×9): qty 1

## 2017-09-16 MED ORDER — SPIRONOLACTONE 25 MG PO TABS
25.0000 mg | ORAL_TABLET | Freq: Every day | ORAL | Status: DC
  Administered 2017-09-16 – 2017-09-18 (×3): 25 mg via ORAL
  Filled 2017-09-16 (×3): qty 1

## 2017-09-16 NOTE — Progress Notes (Signed)
STROKE TEAM PROGRESS NOTE   SUBJECTIVE (INTERVAL HISTORY) Her mom is at the bedside.  Pt felt she is back to baseline. MRI showed multiple stroke cardioembolic pattern.  Need TEE/loop recorder.  BP still high not in good control.  Put back on home BP meds   OBJECTIVE Temp:  [98.3 F (36.8 C)-98.7 F (37.1 C)] 98.3 F (36.8 C) (03/23 0400) Pulse Rate:  [63-82] 64 (03/23 0400) Cardiac Rhythm: Normal sinus rhythm (03/23 0700) Resp:  [11-20] 18 (03/23 0400) BP: (150-209)/(64-109) 186/86 (03/23 0400) SpO2:  [90 %-100 %] 100 % (03/23 0400) Weight:  [189 lb 6 oz (85.9 kg)] 189 lb 6 oz (85.9 kg) (03/22 2235)  CBC:  Recent Labs  Lab 09/15/17 1215 09/15/17 1228 09/16/17 0431  WBC 9.2  --  8.9  NEUTROABS 5.8  --   --   HGB 14.2 15.3* 12.9  HCT 43.2 45.0 40.0  MCV 81.5  --  82.1  PLT 249  --  867    Basic Metabolic Panel:  Recent Labs  Lab 09/15/17 1215 09/15/17 1228 09/16/17 0039  NA 138 142 138  K 3.6 3.6 3.3*  CL 104 105 104  CO2 24  --  24  GLUCOSE 113* 108* 102*  BUN 12 13 13   CREATININE 1.29* 1.20* 0.95  CALCIUM 9.7  --  8.7*    Lipid Panel:     Component Value Date/Time   CHOL 171 09/15/2017 1217   TRIG 63 09/15/2017 1217   HDL 40 (L) 09/15/2017 1217   CHOLHDL 4.3 09/15/2017 1217   VLDL 13 09/15/2017 1217   LDLCALC 118 (H) 09/15/2017 1217   HgbA1c:  Lab Results  Component Value Date   HGBA1C 6.0 (H) 09/15/2017   Urine Drug Screen:     Component Value Date/Time   LABOPIA NONE DETECTED 02/09/2010 2358   COCAINSCRNUR NONE DETECTED 02/09/2010 2358   LABBENZ NONE DETECTED 02/09/2010 2358   AMPHETMU NONE DETECTED 02/09/2010 2358   THCU NONE DETECTED 02/09/2010 2358   LABBARB  02/09/2010 2358    NONE DETECTED        DRUG SCREEN FOR MEDICAL PURPOSES ONLY.  IF CONFIRMATION IS NEEDED FOR ANY PURPOSE, NOTIFY LAB WITHIN 5 DAYS.        LOWEST DETECTABLE LIMITS FOR URINE DRUG SCREEN Drug Class       Cutoff (ng/mL) Amphetamine      1000 Barbiturate       200 Benzodiazepine   619 Tricyclics       509 Opiates          300 Cocaine          300 THC              50    Alcohol Level No results found for: [place] I have personally reviewed the radiological images below and agree with the radiology interpretations.  Ct Angio Head W Or Wo Contrast Ct Angio Neck W Or Wo Contrast 09/15/2017 IMPRESSION:  1. No emergent large vessel occlusion or hemodynamically significant stenosis of the arteries of the head and neck.  2. Minimal bilateral carotid bifurcation atherosclerotic calcification without stenosis.   Ct Head Wo Contrast 09/15/2017 IMPRESSION:  No acute intracranial abnormality. Mild ethmoid sinusitis.   Mr Brain Wo Contrast 09/15/2017 IMPRESSION:  Multiple small acute infarcts involving the right MCA territory, left PCA territory, and left PICA territory. Findings suggest multiple emboli. Chronic microhemorrhage right posterior temporal lobe and right thalamus possibly due to hypertension.  Transthoracic Echocardiogram - Left ventricle: The cavity size was normal. There was moderate   concentric hypertrophy. Systolic function was normal. The   estimated ejection fraction was in the range of 60% to 65%. [place]   motion was normal; there were no regional [place] motion   abnormalities. Doppler parameters are consistent with abnormal   left ventricular relaxation (grade 1 diastolic dysfunction).   Doppler parameters are consistent with elevated ventricular   end-diastolic filling pressure. - Aortic valve: There was moderate regurgitation. - Mitral valve: Calcified annulus. Mildly thickened leaflets .   There was mild regurgitation. - Left atrium: The atrium was mildly dilated. - Right ventricle: Systolic function was normal. - Tricuspid valve: There was mild regurgitation. - Pulmonic valve: There was trivial regurgitation. - Pulmonary arteries: The main pulmonary artery was normal-sized.   Systolic pressure was within the normal  range. - Inferior vena cava: The vessel was normal in size. - Pericardium, extracardiac: There was no pericardial effusion.    PHYSICAL EXAM  Temp:  [98.1 F (36.7 C)-98.7 F (37.1 C)] 98.1 F (36.7 C) (03/23 1210) Pulse Rate:  [64-82] 70 (03/23 1210) Resp:  [15-20] 18 (03/23 1210) BP: (150-202)/(64-109) 168/78 (03/23 1210) SpO2:  [90 %-100 %] 100 % (03/23 1210) Weight:  [189 lb 6 oz (85.9 kg)] 189 lb 6 oz (85.9 kg) (03/22 2235)  General - Well nourished, well developed, in no apparent distress.  Ophthalmologic - Fundi not visualized due to noncooperation.  Cardiovascular - Regular rate and rhythm.  Mental Status -  Level of arousal and orientation to time, place, and person were intact. Language including expression, naming, repetition, comprehension was assessed and found intact. Fund of Knowledge was assessed and was intact.  Cranial Nerves II - XII - II - Visual field intact OU. III, IV, VI - Extraocular movements intact, however baseline lazy eyes on the left. V - Facial sensation intact bilaterally. VII - Facial movement intact bilaterally. VIII - Hearing & vestibular intact bilaterally. X - Palate elevates symmetrically. XI - Chin turning & shoulder shrug intact bilaterally. XII - Tongue protrusion intact.  Motor Strength - The patient's strength was normal in all extremities and pronator drift was absent.  Bulk was normal and fasciculations were absent.   Motor Tone - Muscle tone was assessed at the neck and appendages and was normal.  Reflexes - The patient's reflexes were 1+ in all extremities and she had no pathological reflexes.  Sensory - Light touch, temperature/pinprick were assessed and were symmetrical.    Coordination - The patient had normal movements in the hands with no ataxia or dysmetria.  Tremor was absent.  Gait and Station -deferred   ASSESSMENT/PLAN [name] is a 57 y.o. female with history of prediabetes, history of  palpitations, positive TB test, history of Graves' disease, history of migraines, colon cancer, and current smoker presenting with left-sided weakness, blurred vision, ataxia, and elevated blood pressure. She did not receive IV t-PA due to late presentation.  Stroke: Multiple bilateral infarcts consistent with emboli source unknown  Resultant back to baseline  CT head -no acute abnormality  MRI head - Multiple small acute infarcts involving the right MCA territory, right MCA/ACA, left MCA/PCA, left cerebellum. Chronic microhemorrhage possibly due to hypertension.   CTA H&N -unremarkable  2D Echo - EF 60-65%  Recommend TEE/loop recorder for embolic workup  LDL 161  HgbA1c - 6.0  UDS pending  VTE prophylaxis - SCDs Fall precautions Diet Heart Room service appropriate? Yes; Fluid consistency:  Thin  No antithrombotic prior to admission, now on aspirin 81 mg daily.  Patient counseled to be compliant with her antithrombotic medications  Ongoing aggressive stroke risk factor management  Therapy recommendations:  pending  Disposition:  Pending  Hypertension  Still high  Resume home medication of Coreg and spironolactone  Long-term BP goal normotensive  Hyperlipidemia  Lipid lowering medication PTA:  none  LDL 118, goal < 70  Now on Lipitor 20 mg daily  Continue statin at discharge  Tobacco abuse  Current smoker  Smoking cessation counseling provided  Nicotine patch provided  Pt is willing to quit  Other Stroke Risk Factors  Obesity, Body mass index is 31.51 kg/m., recommend weight loss, diet and exercise as appropriate   Family hx stroke (father)  Migraines  Other Active Problems  Mild hypokalemia 3.3 - supplemented  Graves' disease status post radiation -now on Synthroid, TSH normal   Hospital day # 1  Rosalin Hawking, MD PhD Stroke Neurology 09/16/2017 5:57 PM   To contact Stroke Continuity provider, please refer to [email or web address] After hours,  contact General Neurology

## 2017-09-16 NOTE — H&P (Addendum)
[facility] Admission History and Physical Service Pager: [phone]  Patient name: Teresa [name] Medical record number: [id number] Date of birth: [birth year] Age: 57 y.o. Gender: female  Primary Care Provider: Kinnie Feil, MD Consultants: Neurology Code Status: Full  Chief Complaint: Left arm weakness  Assessment and Plan: Teresa [name] is a 57 y.o. female past medical history significant for hypertension, history of Graves' disease now hypothyroid is, hiatal hernia, migraine, chronic back pain who presented with acute onset of left arm weakness and found to have multiple small acute infarcts in right MCA left PCA left PICA territory suspicious for embolic etiology.  #Left arm weakness, acute, unchanged Patient presented with left arm weakness in the setting of severe headache.  Patient did not exhibit any other signs of deficits.  On initial presentation blood pressure was severely elevated.  Left arm weakness, neurology was consulted for stroke workup.  MRI showed multiple small infarcts MCA, PCA and PICA distribution appears to be embolic in nature. Patient will undergo full neurologic workup for stroke. --Admit to FMTS, admitting physician Dr. Ardelia Mems --Admit to telemetry --Follow-up on neurology recommendations --Order CT and show head and neck --A1c, lipid panel, TSH, --Follow-up with echo --Follow-up on a.m. CBC and BMP --Neuro checks --PT/OT/SLP  #AKI Patient presented with a creatinine of 1.29 for baseline around 0.8-0.9.  Patient reports that she had poor p.o. intake for the past 24 hours and has had multiple episodes of diarrhea the day prior to admission.  AK I likely prerenal.  Patient has received a small bolus --Continue IV fluid --Follow-up on a.m. BMP  #Hypertension On admission patient's blood pressure was 163/94 which continued to rise to a max of 203/102.  In the setting of stroke we will allow for permissive hypertension per  specific parameters for treatment SBP >220, DBP>120.  Per neurology could consider labetalol  or nicardipine drip as needed.  We will continue to monitor. --Resume home BP meds in the next 48 hours\  #Mild elevated D dimers 0.54, low suspicion for PE. Likely secondary to current disease state. Wil continue to monitor.   #Hypothyroidism Secondary to Graves' disease.  TSH within normal limits slightly elevated T4.  We will continue current regimen.  We will recheck an outpatient setting in a few weeks. --Continue levothyroxine 100 mcg daily  #Tobacco abuse Patient will need counseling on smoking cessation in the setting of new CVA. --nicotine patch  #Hiatal hernia Patient currently on Protonix 40 mg daily.  Patient is currently n.p.o.  We will resume home meds when diet is started  FEN/GI: Heart healthy diet after passing swallow screening Prophylaxis: Protonix once diet started  Disposition: Home pending stroke workup  History of Present Illness:  Teresa [name] is a 57 y.o. female with a past medical history significant for hypertension, history of Graves' disease now hypothyroid is,  hiatal hernia, migraine, chronic back pain who presented today with left arm weakness.  Patient reports that symptoms started yesterday when she woke up with severe headache and noticed that her left arm was weaker.  Patient reports that she was unable to use left arm which felt numb.  Patient also reports urinary incontinence.  Patient states that she slept for essentially 24 hours.  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  Patient went to urgent care, and was told should she need to go  to the ER for further evaluation. In the ED, CT head showed multiple small infarcts suspicious for embolic stroke.  Neurology was called for further assessment.  They recommended admission for stroke workup.   Further [place] service was consulted for admission.  Patient also showed worsening creatinine consistent with AKI.  BMP showed hypokalemia.  Review Of Systems: Per HPI with the following additions:   Review of Systems  Constitutional: Negative.   HENT: Negative.   Eyes: Negative.   Respiratory: Negative.   Cardiovascular: Negative.   Gastrointestinal: Negative.   Genitourinary: Negative.   Musculoskeletal: Negative.   Skin: Negative.   Neurological: Positive for weakness and headaches.  Endo/Heme/Allergies: Negative.   Psychiatric/Behavioral: Negative.     Patient Active Problem List   Diagnosis Date Noted  . Stroke ([place]) 09/15/2017  . History of cholecystectomy 06/27/2017  . Small intestinal bacterial overgrowth 03/23/2017  . Serous otitis media 01/06/2017  . Paresthesia 01/06/2017  . Hypokalemia 01/06/2017  . EKG, abnormal 01/06/2017  . Constipation 11/04/2016  . Left ankle pain 10/11/2016  . Pain of right heel 10/11/2016  . Cerumen impaction 09/27/2016  . Prediabetes 10/29/2015  . Tendinopathy of left rotator cuff 10/27/2015  . Essential hypertension   . Right shoulder pain 04/15/2014  . Obesity 04/15/2014  . Lateral knee pain, left 08/06/2013  . Allergic rhinitis 07/19/2013  . Thyroid nodule 02/12/2013  . GERD (gastroesophageal reflux disease) 02/06/2012  . Tobacco abuse 09/21/2011  . Hypothyroidism 09/21/2011  . Throat pain 09/21/2011    Past Medical History: Past Medical History:  Diagnosis Date  . Allergy   . Anemia   . Cervical disc disorder with radiculopathy of cervical region 02/10/2016  . Chronic lower back pain   . Diverticulosis   . Gastritis   . GERD (gastroesophageal reflux disease)   . H/O hiatal hernia   . Hiatal hernia   . History of amputation of lesser toe of left foot ([place]) 03/11/2015  . History of colon cancer   . Hypertension   . Hyperthyroidism    hx  . Hypothyroidism   . Migraine headache    "a few times/yr" (03/05/2015)  .  Neck strain 02/06/2012  . Osteomyelitis ([place])    [name] 03/05/2015  . Osteomyelitis of toe of left foot ([place]) 03/05/2015  . Palpitations 01/05/2016  . Positive TB test   . Tubular adenoma of colon   . Tubular adenoma of colon     Past Surgical History: Past Surgical History:  Procedure Laterality Date  . ABDOMINAL HYSTERECTOMY  2006  . AMPUTATION TOE Left 03/05/2015   Procedure: AMPUTATION LEFT 5TH  TOE;  Surgeon: Wylene Simmer, MD;  Location: [facility];  Service: Orthopedics;  Laterality: Left;  . APPENDECTOMY  2006  . BREAST BIOPSY Left 11/26/2014  . [place]  . COLONOSCOPY    . TOE AMPUTATION Left 03/05/2015   5th toe  . TUBAL LIGATION Bilateral 1990  . UPPER GASTROINTESTINAL ENDOSCOPY      Social History: Social History   Tobacco Use  . Smoking status: Current Every Day Smoker    Packs/day: 0.50    Years: 36.00    Pack years: 18.00    Types: Cigarettes  . Smokeless tobacco: Never Used  . Tobacco comment: Tobacco info given 03/01/17  Substance Use Topics  . Alcohol use: No  . Drug use: No   Additional social history:   Please also refer to relevant sections of EMR.  Family History: Family History  Problem Relation Age of Onset  . Heart disease Mother        CAB age 80  . Diabetes Mother   . Cancer Father 28       pancreatic cancer  . Stroke Father   . Diabetes Sister   . Kidney disease Sister        renal failure  . Diabetes Brother   . Kidney disease Brother        renal failure  . Heart disease Other   . Diabetes Other   . Breast cancer Maternal Aunt        per pt aunts and uncles died of cancer not sure the type  . Breast cancer Paternal Aunt   . Esophageal cancer Neg Hx   . Rectal cancer Neg Hx   . Stomach cancer Neg Hx    (If not completed, MUST add something in)  Allergies and Medications: Allergies  Allergen Reactions  . Bactrim [Sulfamethoxazole-Trimethoprim] Anaphylaxis  . Clonidine Derivatives Swelling and Other (See  Comments)    Facial swelling  . Amlodipine Swelling    Leg swelling  . Lisinopril Swelling    Lip swelling  . Aspirin Nausea Only  . Latex Rash   No current facility-administered medications on file prior to encounter.    Current Outpatient Medications on File Prior to Encounter  Medication Sig Dispense Refill  . carvedilol (COREG) 6.25 MG tablet Take 6.25 mg by mouth 2 (two) times daily with a meal.    . Cholecalciferol (VITAMIN D PO) Take 1 tablet by mouth daily.    . cyanocobalamin 500 MCG tablet Take 500 mcg by mouth daily.    . cyclobenzaprine (FLEXERIL) 10 MG tablet Take 1 tablet (10 mg total) by mouth 3 (three) times daily as needed for muscle spasms. 15 tablet 0  . cycloSPORINE (RESTASIS) 0.05 % ophthalmic emulsion Place 1 drop into both eyes 2 (two) times daily.     Marland Kitchen levothyroxine (SYNTHROID, LEVOTHROID) 112 MCG tablet TAKE ONE TABLET BY MOUTH ONCE DAILY BEFORE BREAKFAST (Patient taking differently: Take 112 mcg by mouth daily before breakfast. ) 90 tablet 1  . mometasone (NASONEX) 50 MCG/ACT nasal spray Place 2 sprays into the nose daily. 17 g 12  . oxymetazoline (AFRIN) 0.05 % nasal spray Place 1 spray into both nostrils See admin instructions. Instill one spray into each nostrils every morning a couple hours after Nasonex dose and then again at bedtime    . Polyethylene Glycol 400 (BLINK TEARS OP) Place 1 drop into both eyes 4 (four) times daily as needed (dry eyes).    . naproxen (NAPROSYN) 500 MG tablet Take 1 tablet (500 mg total) by mouth 2 (two) times daily with a meal. (Patient not taking: Reported on 09/15/2017) 14 tablet 0  . pantoprazole (PROTONIX) 40 MG tablet Take 1 tablet (40 mg total) by mouth 2 (two) times daily. (Patient not taking: Reported on 06/28/2017) 60 tablet 2  . spironolactone (ALDACTONE) 25 MG tablet TAKE 1 TABLET BY MOUTH ONCE DAILY (Patient taking differently: Take 25 mg by mouth daily. ) 60 tablet 0    Objective: BP (!) 192/81 (BP Location: Left Arm)    Pulse 67   Temp 98.4 F (36.9 C) (Oral)   Resp 20   Ht 5\' 5"  (1.651 m)   Wt 189 lb 6 oz (85.9 kg)   SpO2 100%   BMI 31.51 kg/m    General: NAD, pleasant, able to participate in exam Cardiac: RRR, normal heart sounds,  no murmurs. 2+ radial and PT pulses bilaterally Respiratory: CTAB, normal effort, No wheezes, rales or rhonchi Abdomen: soft, nontender, nondistended, no hepatic or splenomegaly, +BS Extremities: no edema or cyanosis. WWP. Skin: warm and dry, no rashes noted Neuro: alert and oriented x4, cranial nerves II through XII intact.  Left upper extremity strength 3/5 right upper extremity strength 5/5. Bilateral lower extremity 5/5 strength.  Sensation intact upper and lower extremity bilaterally.  Babinski negative.  Did not perform finger to nose all rapid alternating movement.  Gait within normal limits Psych: Normal affect and mood   Labs and Imaging: CBC BMET  Recent Labs  Lab 09/15/17 1215 09/15/17 1228  WBC 9.2  --   HGB 14.2 15.3*  HCT 43.2 45.0  PLT 249  --    Recent Labs  Lab 09/16/17 0039  NA 138  K 3.3*  CL 104  CO2 24  BUN 13  CREATININE 0.95  GLUCOSE 102*  CALCIUM 8.7*     Ct Angio Head W Or Wo Contrast  Result Date: 09/15/2017 CLINICAL DATA:  Headache, left arm pain and weakness EXAM: CT ANGIOGRAPHY HEAD AND NECK TECHNIQUE: Multidetector CT imaging of the head and neck was performed using the standard protocol during bolus administration of intravenous contrast. Multiplanar CT image reconstructions and MIPs were obtained to evaluate the vascular anatomy. Carotid stenosis measurements (when applicable) are obtained utilizing NASCET criteria, using the distal internal carotid diameter as the denominator. CONTRAST:  29mL ISOVUE-370 IOPAMIDOL (ISOVUE-370) INJECTION 76% COMPARISON:  Head CT 09/15/2017 Brain MRI 09/15/2017 FINDINGS: CTA NECK FINDINGS Aortic arch: There is no calcific atherosclerosis of the aortic arch. There is no aneurysm, dissection or  hemodynamically significant stenosis of the visualized ascending aorta and aortic arch. Conventional 3 vessel aortic branching pattern. The visualized proximal subclavian arteries are widely patent. Right carotid system: --Common carotid artery: Widely patent origin without common carotid artery dissection or aneurysm. Mild atherosclerotic calcification at the carotid bifurcation without hemodynamically significant stenosis. --Internal carotid artery: No dissection, occlusion or aneurysm. No hemodynamically significant stenosis. --External carotid artery: No acute abnormality. Left carotid system: --Common carotid artery: Widely patent origin without common carotid artery dissection or aneurysm. Mild atherosclerotic calcification at the carotid bifurcation without hemodynamically significant stenosis. --Internal carotid artery:No dissection, occlusion or aneurysm. No hemodynamically significant stenosis. --External carotid artery: No acute abnormality. Vertebral arteries: Left dominant configuration. Both origins are normal. No dissection, occlusion or flow-limiting stenosis to the vertebrobasilar confluence. Skeleton: There is no bony spinal canal stenosis. No lytic or blastic lesion. Other neck: Normal pharynx, larynx and major salivary glands. No cervical lymphadenopathy. Unremarkable thyroid gland. Upper chest: No pneumothorax or pleural effusion. No nodules or masses. CTA HEAD FINDINGS Anterior circulation: --Intracranial internal carotid arteries: Normal. --Anterior cerebral arteries: Normal. Both A1 segments are present. --Middle cerebral arteries: Normal. --Posterior communicating arteries: Present on the right, absent on the left. Posterior circulation: --Basilar artery: Normal. --Posterior cerebral arteries: Normal. --Superior cerebellar arteries: Normal. --Inferior cerebellar arteries: Normal anterior and posterior inferior cerebellar arteries. Venous sinuses: As permitted by contrast timing, patent.  Anatomic variants: Fetal origin of the right posterior cerebral artery. Delayed phase: No parenchymal contrast enhancement. Review of the MIP images confirms the above findings. IMPRESSION: 1. No emergent large vessel occlusion or hemodynamically significant stenosis of the arteries of the head and neck. 2. Minimal bilateral carotid bifurcation atherosclerotic calcification without stenosis. Electronically Signed   By: Ulyses Jarred M.D.   On: 09/15/2017 19:48   Dg Chest 2 View  Result  Date: 09/15/2017 CLINICAL DATA:  Chest pain EXAM: CHEST - 2 VIEW COMPARISON:  05/22/2014 FINDINGS: The heart size and mediastinal contours are within normal limits. Both lungs are clear. The visualized skeletal structures are unremarkable. IMPRESSION: No active cardiopulmonary disease. Electronically Signed   By: Donavan Foil M.D.   On: 09/15/2017 14:47   Ct Head Wo Contrast  Result Date: 09/15/2017 CLINICAL DATA:  Headache and left arm pain for 24 hours. EXAM: CT HEAD WITHOUT CONTRAST TECHNIQUE: Contiguous axial images were obtained from the base of the skull through the vertex without intravenous contrast. COMPARISON:  03/23/2012 FINDINGS: Brain: No evidence of acute infarction, hemorrhage, hydrocephalus, extra-axial collection or mass lesion/mass effect. Vascular: No hyperdense vessel or unexpected calcification. Skull: Normal. Negative for fracture or focal lesion. Sinuses/Orbits: Mild ethmoid sinusitis. The remainder of the paranasal sinuses and mastoid air cells are normal. Other: None. IMPRESSION: No acute intracranial abnormality. Mild ethmoid sinusitis. Electronically Signed   By: Fidela Salisbury M.D.   On: 09/15/2017 12:53   Ct Angio Neck W Or Wo Contrast  Result Date: 09/15/2017 CLINICAL DATA:  Headache, left arm pain and weakness EXAM: CT ANGIOGRAPHY HEAD AND NECK TECHNIQUE: Multidetector CT imaging of the head and neck was performed using the standard protocol during bolus administration of intravenous  contrast. Multiplanar CT image reconstructions and MIPs were obtained to evaluate the vascular anatomy. Carotid stenosis measurements (when applicable) are obtained utilizing NASCET criteria, using the distal internal carotid diameter as the denominator. CONTRAST:  41mL ISOVUE-370 IOPAMIDOL (ISOVUE-370) INJECTION 76% COMPARISON:  Head CT 09/15/2017 Brain MRI 09/15/2017 FINDINGS: CTA NECK FINDINGS Aortic arch: There is no calcific atherosclerosis of the aortic arch. There is no aneurysm, dissection or hemodynamically significant stenosis of the visualized ascending aorta and aortic arch. Conventional 3 vessel aortic branching pattern. The visualized proximal subclavian arteries are widely patent. Right carotid system: --Common carotid artery: Widely patent origin without common carotid artery dissection or aneurysm. Mild atherosclerotic calcification at the carotid bifurcation without hemodynamically significant stenosis. --Internal carotid artery: No dissection, occlusion or aneurysm. No hemodynamically significant stenosis. --External carotid artery: No acute abnormality. Left carotid system: --Common carotid artery: Widely patent origin without common carotid artery dissection or aneurysm. Mild atherosclerotic calcification at the carotid bifurcation without hemodynamically significant stenosis. --Internal carotid artery:No dissection, occlusion or aneurysm. No hemodynamically significant stenosis. --External carotid artery: No acute abnormality. Vertebral arteries: Left dominant configuration. Both origins are normal. No dissection, occlusion or flow-limiting stenosis to the vertebrobasilar confluence. Skeleton: There is no bony spinal canal stenosis. No lytic or blastic lesion. Other neck: Normal pharynx, larynx and major salivary glands. No cervical lymphadenopathy. Unremarkable thyroid gland. Upper chest: No pneumothorax or pleural effusion. No nodules or masses. CTA HEAD FINDINGS Anterior circulation:  --Intracranial internal carotid arteries: Normal. --Anterior cerebral arteries: Normal. Both A1 segments are present. --Middle cerebral arteries: Normal. --Posterior communicating arteries: Present on the right, absent on the left. Posterior circulation: --Basilar artery: Normal. --Posterior cerebral arteries: Normal. --Superior cerebellar arteries: Normal. --Inferior cerebellar arteries: Normal anterior and posterior inferior cerebellar arteries. Venous sinuses: As permitted by contrast timing, patent. Anatomic variants: Fetal origin of the right posterior cerebral artery. Delayed phase: No parenchymal contrast enhancement. Review of the MIP images confirms the above findings. IMPRESSION: 1. No emergent large vessel occlusion or hemodynamically significant stenosis of the arteries of the head and neck. 2. Minimal bilateral carotid bifurcation atherosclerotic calcification without stenosis. Electronically Signed   By: Ulyses Jarred M.D.   On: 09/15/2017 19:48   Mr Brain  Wo Contrast  Result Date: 09/15/2017 CLINICAL DATA:  Stroke.  Left arm weakness.  Headache, hypertension EXAM: MRI HEAD WITHOUT CONTRAST TECHNIQUE: Multiplanar, multiecho pulse sequences of the brain and surrounding structures were obtained without intravenous contrast. COMPARISON:  CT head 09/15/2017 FINDINGS: Brain: Multiple small areas of acute infarct in the right posterior frontal lobe and parietal lobe. Small acute infarcts in the left occipital pole and left lateral inferior cerebellum. Ventricle size is normal. Mild chronic white matter changes. Chronic microhemorrhage right posterior temporal lobe, and right thalamus. Negative for mass or edema or midline shift. Vascular: Normal arterial flow void Skull and upper cervical spine: Negative Sinuses/Orbits: Mild mucosal edema paranasal sinuses.  Normal orbit. Other: None IMPRESSION: Multiple small acute infarcts involving the right MCA territory, left PCA territory, and left PICA territory.  Findings suggest multiple emboli. Chronic microhemorrhage right posterior temporal lobe and right thalamus possibly due to hypertension. Electronically Signed   By: Franchot Gallo M.D.   On: 09/15/2017 16:21     Marjie Skiff, MD 09/16/2017, 2:04 AM PGY-2, Reader Intern pager: [phone], text pages welcome

## 2017-09-16 NOTE — Progress Notes (Signed)
Family Medicine Teaching Service Daily Progress Note Intern Pager: [phone]  Patient name: Teresa [name] Medical record number: [id number] Date of birth: 09/05/1960 Age: 57 y.o. Gender: female  Primary Care Provider: Kinnie Feil, MD Consultants: Neurology Code Status: Full  Pt Overview and Major Events to Date:  Admitted 3/22-CVA  Assessment and Plan: Teresa [name] is a 57 y.o. female past medical history significant for hypertension, history of Graves' disease now hypothyroid , hiatal hernia, migraine, chronic back pain who presented with acute onset of left arm weakness and found to have multiple small acute infarcts in right MCA left PCA left PICA territory suspicious for embolic etiology.  #Left arm weakness, acute, stable Patient presented with left arm weakness in the setting of severe headache.  MRI showed multiple small infarcts MCA, PCA and PICA distribution appears to be embolic in nature. CTA head and neck did not show any large vessel occlusion or significant stenosis of the arteries in the neck. TSH withi normal limit,A1c 6.0 down from 6.3 and lipid panel with mildly elevated LDL. --Follow-up on neurology recommendations --Follow-up with echo --Follow-up on a.m. CBC and BMP --Neuro checks --PT/OT/SLP  #Hyperkalemia Reported some diarrhea two days ago. Gave 40 mg Kdur. --Recheck BMP later today --Replete as needed  #AKI, resolved  Like prerenal in nature. This morning 0.95 down from 1.20. Patient is taking adequate po but could continue fluid at low rate as needed --Continue IV fluid as needed --Follow-up on a.m. BMP  #Hypertension Continue permissive hypertension. Still elevated, but will consider addng PRN oral agent as patient is now 48 hours from symptoms onset. --Resume home BP meds as appropriate  #Mild elevated D dimers 0.54, low suspicion for PE. Likely secondary to current disease state. Will continue to monitor.   #Hypothyroidism Secondary to  Graves' disease.  TSH within normal limits slightly elevated T4.  We will continue current regimen.  We will recheck an outpatient setting in a few weeks. --Continue levothyroxine 100 mcg daily  #Tobacco abuse Patient will need counseling on smoking cessation in the setting of new CVA. --nicotine patch  #Hiatal hernia Patient currently on Protonix 40 mg daily.  Patient is currently n.p.o.  We will resume home meds when diet is started   FEN/GI: heart healthy  PPx: Protonix  Disposition: Home pending clinical improvement  Subjective:  Patient feeling better this morning, having difficulty taking po.   Objective: Temp:  [98.3 F (36.8 C)-98.7 F (37.1 C)] 98.3 F (36.8 C) (03/23 0814) Pulse Rate:  [63-82] 69 (03/23 0814) Resp:  [11-20] 18 (03/23 0814) BP: (150-209)/(64-109) 182/84 (03/23 0814) SpO2:  [90 %-100 %] 100 % (03/23 0814) Weight:  [189 lb 6 oz (85.9 kg)] 189 lb 6 oz (85.9 kg) (03/22 2235)   Physical Exam: General: NAD, pleasant, able to participate in exam Cardiac: RRR, normal heart sounds, no murmurs. 2+ radial and PT pulses bilaterally Respiratory: CTAB, normal effort, No wheezes, rales or rhonchi Abdomen: soft, nontender, nondistended, no hepatic or splenomegaly, +BS Extremities: no edema or cyanosis. WWP. Skin: warm and dry, no rashes noted Neuro: alert and oriented x4, cranial nerves II through XII intact.  Left upper extremity strength 3/5 right upper extremity strength 5/5. Bilateral lower extremity 5/5 strength.  Sensation intact upper and lower extremity bilaterally.  Babinski negative.  Did not perform finger to nose all rapid alternating movement.  Gait within normal limits Psych: Normal affect and mood     Laboratory: Recent Labs  Lab 09/15/17 1215 09/15/17 1228 09/16/17 0431  WBC 9.2  --  8.9  HGB 14.2 15.3* 12.9  HCT 43.2 45.0 40.0  PLT 249  --  233   Recent Labs  Lab 09/15/17 1215 09/15/17 1228 09/16/17 0039  NA 138 142 138  K 3.6  3.6 3.3*  CL 104 105 104  CO2 24  --  24  BUN 12 13 13   CREATININE 1.29* 1.20* 0.95  CALCIUM 9.7  --  8.7*  PROT 7.2  --   --   BILITOT 0.6  --   --   ALKPHOS 87  --   --   ALT 21  --   --   AST 21  --   --   GLUCOSE 113* 108* 102*   Lipid Panel     Component Value Date/Time   CHOL 171 09/15/2017 1217   TRIG 63 09/15/2017 1217   HDL 40 (L) 09/15/2017 1217   CHOLHDL 4.3 09/15/2017 1217   VLDL 13 09/15/2017 1217   LDLCALC 118 (H) 09/15/2017 1217    Imaging/Diagnostic Tests: Ct Angio Head W Or Wo Contrast  Result Date: 09/15/2017 CLINICAL DATA:  Headache, left arm pain and weakness EXAM: CT ANGIOGRAPHY HEAD AND NECK TECHNIQUE: Multidetector CT imaging of the head and neck was performed using the standard protocol during bolus administration of intravenous contrast. Multiplanar CT image reconstructions and MIPs were obtained to evaluate the vascular anatomy. Carotid stenosis measurements (when applicable) are obtained utilizing NASCET criteria, using the distal internal carotid diameter as the denominator. CONTRAST:  14mL ISOVUE-370 IOPAMIDOL (ISOVUE-370) INJECTION 76% COMPARISON:  Head CT 09/15/2017 Brain MRI 09/15/2017 FINDINGS: CTA NECK FINDINGS Aortic arch: There is no calcific atherosclerosis of the aortic arch. There is no aneurysm, dissection or hemodynamically significant stenosis of the visualized ascending aorta and aortic arch. Conventional 3 vessel aortic branching pattern. The visualized proximal subclavian arteries are widely patent. Right carotid system: --Common carotid artery: Widely patent origin without common carotid artery dissection or aneurysm. Mild atherosclerotic calcification at the carotid bifurcation without hemodynamically significant stenosis. --Internal carotid artery: No dissection, occlusion or aneurysm. No hemodynamically significant stenosis. --External carotid artery: No acute abnormality. Left carotid system: --Common carotid artery: Widely patent origin  without common carotid artery dissection or aneurysm. Mild atherosclerotic calcification at the carotid bifurcation without hemodynamically significant stenosis. --Internal carotid artery:No dissection, occlusion or aneurysm. No hemodynamically significant stenosis. --External carotid artery: No acute abnormality. Vertebral arteries: Left dominant configuration. Both origins are normal. No dissection, occlusion or flow-limiting stenosis to the vertebrobasilar confluence. Skeleton: There is no bony spinal canal stenosis. No lytic or blastic lesion. Other neck: Normal pharynx, larynx and major salivary glands. No cervical lymphadenopathy. Unremarkable thyroid gland. Upper chest: No pneumothorax or pleural effusion. No nodules or masses. CTA HEAD FINDINGS Anterior circulation: --Intracranial internal carotid arteries: Normal. --Anterior cerebral arteries: Normal. Both A1 segments are present. --Middle cerebral arteries: Normal. --Posterior communicating arteries: Present on the right, absent on the left. Posterior circulation: --Basilar artery: Normal. --Posterior cerebral arteries: Normal. --Superior cerebellar arteries: Normal. --Inferior cerebellar arteries: Normal anterior and posterior inferior cerebellar arteries. Venous sinuses: As permitted by contrast timing, patent. Anatomic variants: Fetal origin of the right posterior cerebral artery. Delayed phase: No parenchymal contrast enhancement. Review of the MIP images confirms the above findings. IMPRESSION: 1. No emergent large vessel occlusion or hemodynamically significant stenosis of the arteries of the head and neck. 2. Minimal bilateral carotid bifurcation atherosclerotic calcification without stenosis. Electronically Signed   By: Ulyses Jarred M.D.   On: 09/15/2017 19:48  Dg Chest 2 View  Result Date: 09/15/2017 CLINICAL DATA:  Chest pain EXAM: CHEST - 2 VIEW COMPARISON:  05/22/2014 FINDINGS: The heart size and mediastinal contours are within normal  limits. Both lungs are clear. The visualized skeletal structures are unremarkable. IMPRESSION: No active cardiopulmonary disease. Electronically Signed   By: Donavan Foil M.D.   On: 09/15/2017 14:47   Ct Head Wo Contrast  Result Date: 09/15/2017 CLINICAL DATA:  Headache and left arm pain for 24 hours. EXAM: CT HEAD WITHOUT CONTRAST TECHNIQUE: Contiguous axial images were obtained from the base of the skull through the vertex without intravenous contrast. COMPARISON:  03/23/2012 FINDINGS: Brain: No evidence of acute infarction, hemorrhage, hydrocephalus, extra-axial collection or mass lesion/mass effect. Vascular: No hyperdense vessel or unexpected calcification. Skull: Normal. Negative for fracture or focal lesion. Sinuses/Orbits: Mild ethmoid sinusitis. The remainder of the paranasal sinuses and mastoid air cells are normal. Other: None. IMPRESSION: No acute intracranial abnormality. Mild ethmoid sinusitis. Electronically Signed   By: Fidela Salisbury M.D.   On: 09/15/2017 12:53   Ct Angio Neck W Or Wo Contrast  Result Date: 09/15/2017 CLINICAL DATA:  Headache, left arm pain and weakness EXAM: CT ANGIOGRAPHY HEAD AND NECK TECHNIQUE: Multidetector CT imaging of the head and neck was performed using the standard protocol during bolus administration of intravenous contrast. Multiplanar CT image reconstructions and MIPs were obtained to evaluate the vascular anatomy. Carotid stenosis measurements (when applicable) are obtained utilizing NASCET criteria, using the distal internal carotid diameter as the denominator. CONTRAST:  41mL ISOVUE-370 IOPAMIDOL (ISOVUE-370) INJECTION 76% COMPARISON:  Head CT 09/15/2017 Brain MRI 09/15/2017 FINDINGS: CTA NECK FINDINGS Aortic arch: There is no calcific atherosclerosis of the aortic arch. There is no aneurysm, dissection or hemodynamically significant stenosis of the visualized ascending aorta and aortic arch. Conventional 3 vessel aortic branching pattern. The  visualized proximal subclavian arteries are widely patent. Right carotid system: --Common carotid artery: Widely patent origin without common carotid artery dissection or aneurysm. Mild atherosclerotic calcification at the carotid bifurcation without hemodynamically significant stenosis. --Internal carotid artery: No dissection, occlusion or aneurysm. No hemodynamically significant stenosis. --External carotid artery: No acute abnormality. Left carotid system: --Common carotid artery: Widely patent origin without common carotid artery dissection or aneurysm. Mild atherosclerotic calcification at the carotid bifurcation without hemodynamically significant stenosis. --Internal carotid artery:No dissection, occlusion or aneurysm. No hemodynamically significant stenosis. --External carotid artery: No acute abnormality. Vertebral arteries: Left dominant configuration. Both origins are normal. No dissection, occlusion or flow-limiting stenosis to the vertebrobasilar confluence. Skeleton: There is no bony spinal canal stenosis. No lytic or blastic lesion. Other neck: Normal pharynx, larynx and major salivary glands. No cervical lymphadenopathy. Unremarkable thyroid gland. Upper chest: No pneumothorax or pleural effusion. No nodules or masses. CTA HEAD FINDINGS Anterior circulation: --Intracranial internal carotid arteries: Normal. --Anterior cerebral arteries: Normal. Both A1 segments are present. --Middle cerebral arteries: Normal. --Posterior communicating arteries: Present on the right, absent on the left. Posterior circulation: --Basilar artery: Normal. --Posterior cerebral arteries: Normal. --Superior cerebellar arteries: Normal. --Inferior cerebellar arteries: Normal anterior and posterior inferior cerebellar arteries. Venous sinuses: As permitted by contrast timing, patent. Anatomic variants: Fetal origin of the right posterior cerebral artery. Delayed phase: No parenchymal contrast enhancement. Review of the MIP  images confirms the above findings. IMPRESSION: 1. No emergent large vessel occlusion or hemodynamically significant stenosis of the arteries of the head and neck. 2. Minimal bilateral carotid bifurcation atherosclerotic calcification without stenosis. Electronically Signed   By: Cletus Gash.D.  On: 09/15/2017 19:48   Mr Brain Wo Contrast  Result Date: 09/15/2017 CLINICAL DATA:  Stroke.  Left arm weakness.  Headache, hypertension EXAM: MRI HEAD WITHOUT CONTRAST TECHNIQUE: Multiplanar, multiecho pulse sequences of the brain and surrounding structures were obtained without intravenous contrast. COMPARISON:  CT head 09/15/2017 FINDINGS: Brain: Multiple small areas of acute infarct in the right posterior frontal lobe and parietal lobe. Small acute infarcts in the left occipital pole and left lateral inferior cerebellum. Ventricle size is normal. Mild chronic white matter changes. Chronic microhemorrhage right posterior temporal lobe, and right thalamus. Negative for mass or edema or midline shift. Vascular: Normal arterial flow void Skull and upper cervical spine: Negative Sinuses/Orbits: Mild mucosal edema paranasal sinuses.  Normal orbit. Other: None IMPRESSION: Multiple small acute infarcts involving the right MCA territory, left PCA territory, and left PICA territory. Findings suggest multiple emboli. Chronic microhemorrhage right posterior temporal lobe and right thalamus possibly due to hypertension. Electronically Signed   By: Franchot Gallo M.D.   On: 09/15/2017 16:21    Marjie Skiff, MD 09/16/2017, 10:02 AM PGY-2, [place] Intern pager: [phone], text pages welcome

## 2017-09-16 NOTE — Evaluation (Addendum)
Speech Language Pathology Evaluation Patient Details Name: Teresa [name] MRN: [id number] DOB: 03-21-1961 Today's Date: 09/16/2017 Time: 9381-8299 SLP Time Calculation (min) (ACUTE ONLY): 27 min  Problem List:  Patient Active Problem List   Diagnosis Date Noted  . Stroke ([place]) 09/15/2017  . History of cholecystectomy 06/27/2017  . Small intestinal bacterial overgrowth 03/23/2017  . Serous otitis media 01/06/2017  . Paresthesia 01/06/2017  . Hypokalemia 01/06/2017  . EKG, abnormal 01/06/2017  . Constipation 11/04/2016  . Left ankle pain 10/11/2016  . Pain of right heel 10/11/2016  . Cerumen impaction 09/27/2016  . Prediabetes 10/29/2015  . Tendinopathy of left rotator cuff 10/27/2015  . Essential hypertension   . Right shoulder pain 04/15/2014  . Obesity 04/15/2014  . Lateral knee pain, left 08/06/2013  . Allergic rhinitis 07/19/2013  . Thyroid nodule 02/12/2013  . GERD (gastroesophageal reflux disease) 02/06/2012  . Tobacco abuse 09/21/2011  . Hypothyroidism 09/21/2011  . Throat pain 09/21/2011   Past Medical History:  Past Medical History:  Diagnosis Date  . Allergy   . Anemia   . Cervical disc disorder with radiculopathy of cervical region 02/10/2016  . Chronic lower back pain   . Diverticulosis   . Gastritis   . GERD (gastroesophageal reflux disease)   . H/O hiatal hernia   . Hiatal hernia   . History of amputation of lesser toe of left foot ([place]) 03/11/2015  . History of colon cancer   . Hypertension   . Hyperthyroidism    hx  . Hypothyroidism   . Migraine headache    "a few times/yr" (03/05/2015)  . Neck strain 02/06/2012  . Osteomyelitis ([place])    [name] 03/05/2015  . Osteomyelitis of toe of left foot ([place]) 03/05/2015  . Palpitations 01/05/2016  . Positive TB test   . Tubular adenoma of colon   . Tubular adenoma of colon    Past Surgical History:  Past Surgical History:  Procedure Laterality Date  . ABDOMINAL HYSTERECTOMY  2006  . AMPUTATION TOE Left  03/05/2015   Procedure: AMPUTATION LEFT 5TH  TOE;  Surgeon: Wylene Simmer, MD;  Location: [place];  Service: Orthopedics;  Laterality: Left;  . APPENDECTOMY  2006  . BREAST BIOPSY Left 11/26/2014  . [place]  . COLONOSCOPY    . TOE AMPUTATION Left 03/05/2015   5th toe  . TUBAL LIGATION Bilateral 1990  . UPPER GASTROINTESTINAL ENDOSCOPY     HPI:  57 y.o. female past medical history significant for hypertension, history of Graves' disease now hypothyroid is, hiatal hernia, migraine, chronic back pain who presented with acute onset of left arm weakness and found to have multiple small acute infarcts in right MCA left PCA left PICA territory suspicious for embolic etiology MRI head on 09/15/17 indicated Multiple small acute infarcts involving the right MCA territory, left PCA territory, and left PICA territory. Findings suggest multiple emboli  Assessment / Plan / Recommendation Clinical Impression   Pt administered MOCA (Montreal Cognitive Assessment)and results indicated performance within functional limits as pt achieved a score of 28/30 (26/30 within normal limits) without deficits occurring in any area as speech was 100% intelligible within conversation, awareness of deficits noted (I.e.: left hand minimal numbness affecting writing specifically and fine motor coordination) and all linguistic/cognitive tasks [facility]; Thank you for this consult; ST will sign off at this time.    SLP Assessment  SLP Recommendation/Assessment: Patient does not need any further Speech Language Pathology Services SLP Visit Diagnosis: Other (comment)  Follow Up Recommendations  None    Frequency and Duration     n/a      SLP Evaluation Cognition  Overall Cognitive Status: Within Functional Limits for tasks assessed Orientation Level: Oriented X4 Memory: Appears intact Awareness: Appears intact Problem Solving: Appears intact Safety/Judgment: Appears intact       Comprehension   Auditory Comprehension Overall Auditory Comprehension: Appears within functional limits for tasks assessed Yes/No Questions: Within Functional Limits Commands: Within Functional Limits Conversation: Complex Visual Recognition/Discrimination Discrimination: Within Function Limits Reading Comprehension Reading Status: Within funtional limits    Expression Expression Primary Mode of Expression: Verbal Verbal Expression Overall Verbal Expression: Appears within functional limits for tasks assessed Level of Generative/Spontaneous Verbalization: Conversation Repetition: No impairment Naming: No impairment Pragmatics: No impairment Non-Verbal Means of Communication: Not applicable Written Expression Dominant Hand: Left Written Expression: Exceptions to WFL(inaccurate; legible, but not fully accurate)   Oral / Motor  Oral Motor/Sensory Function Overall Oral Motor/Sensory Function: Within functional limits Motor Speech Overall Motor Speech: Appears within functional limits for tasks assessed Respiration: Within functional limits Phonation: Normal Resonance: Within functional limits Articulation: Within functional limitis Intelligibility: Intelligible Motor Planning: Witnin functional limits Motor Speech Errors: Not applicable                       Elvina Sidle, M.S., CCC-SLP 09/16/2017, 2:25 PM

## 2017-09-16 NOTE — Progress Notes (Signed)
  Echocardiogram 2D Echocardiogram has been performed.  Darlina Sicilian M 09/16/2017, 9:23 AM

## 2017-09-16 NOTE — Progress Notes (Signed)
TIA/stroke report sheet filled out and will be passed on during shift report.

## 2017-09-16 NOTE — Progress Notes (Signed)
Occupational Therapy Evaluation Patient Details Name: Teresa [name] MRN: [id number] DOB: 1960-08-12 Today's Date: 09/16/2017    History of Present Illness Pt is a 57 y/o female admitted secondary to L UE weakness. Pt found to have multiple small acute infarcts in right MCA left PCA left PICA territory suspicious for embolic etiology. PMH including but not limited to HTN, grave's disease.   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 Pt presents with LUE deficits impairing her ability to complete her job tasks at this time. Recommend pt follow up with OT at the neuro outpt center to facilitate safe return to work and IADL tasks. Pt safe to DC home when medically stable. All further OT to be addressed by outpt OT.     Follow Up Recommendations  Outpatient OT(neuro outpt)    Equipment Recommendations  None recommended by OT    Recommendations for Other Services       Precautions / Restrictions Precautions Precautions: None Restrictions Weight Bearing Restrictions: No      Mobility Bed Mobility Overal bed mobility: Modified Independent                Transfers Overall transfer level: Modified independent    Balance Overall balance assessment: No apparent balance deficits (not formally assessed)                             ADL either performed or assessed with clinical judgement   ADL Overall ADL's : At baseline                                             Vision Baseline Vision/History: Wears glasses Wears Glasses: At all times Patient Visual Report: No change from baseline Vision Assessment?: Yes Eye Alignment: Within Functional Limits Ocular Range of Motion: Within Functional Limits Alignment/Gaze Preference: Within Defined Limits Tracking/Visual Pursuits: Able to track stimulus in all quads without difficulty Saccades: Within functional limits Convergence: Within  functional limits Additional Comments: Does not appesar to demonstrate a field deficit     Perception Perception Perception Tested?: No   Praxis Praxis Praxis tested?: Within functional limits    Pertinent Vitals/Pain Pain Assessment: No/denies pain     Hand Dominance Left   Extremity/Trunk Assessment Upper Extremity Assessment Upper Extremity Assessment: LUE deficits/detail LUE Deficits / Details: genrealized weakness but funcitonal - @ 4/5 throughout; difficulty with fine motor control and coordination; mild ataxia;  LUE Coordination: decreased fine motor;decreased gross motor   Lower Extremity Assessment Lower Extremity Assessment: Defer to PT evaluation   Cervical / Trunk Assessment Cervical / Trunk Assessment: Normal   Communication Communication Communication: No difficulties   Cognition Arousal/Alertness: Awake/alert Behavior During Therapy: WFL for tasks assessed/performed;Flat affect Overall Cognitive Status: Within Functional Limits for tasks assessed                                     General Comments       Exercises Exercises: Other exercises Other Exercises Other Exercises: fine motor/coordination activities - handout provided and reviewed Other Exercises: BUE integrated activites   Shoulder Instructions      Home Living Family/patient expects to be discharged to:: Private residence Living Arrangements: Children  Available Help at Discharge: Family;Available PRN/intermittently Type of Home: House Home Access: Stairs to enter [employer] of Steps: "a coupleDesigner, industrial/product: Right;Left Home Layout: One level     Bathroom Shower/Tub: Teacher, early years/pre: Standard Bathroom Accessibility: Yes How Accessible: Accessible via walker Home Equipment: Walker - 2 wheels          Prior Functioning/Environment Level of Independence: Independent        Comments: works as Haematologist at [employer] Problem List: Decreased strength;Decreased coordination;Impaired UE functional use      OT Treatment/Interventions:      OT Goals(Current goals can be found in the care plan section) Acute Rehab OT Goals Patient Stated Goal: return home OT Goal Formulation: All assessment and education complete, DC therapy  OT Frequency:     Barriers to D/C:            Co-evaluation              AM-PAC PT "6 Clicks" Daily Activity     Outcome Measure Help from another person eating meals?: None Help from another person taking care of personal grooming?: None Help from another person toileting, which includes using toliet, bedpan, or urinal?: None Help from another person bathing (including washing, rinsing, drying)?: None Help from another person to put on and taking off regular upper body clothing?: None Help from another person to put on and taking off regular lower body clothing?: None 6 Click Score: 24   End of Session Nurse Communication: Mobility status  Activity Tolerance: Patient tolerated treatment well Patient left: in bed;with call bell/phone within reach;with family/visitor present  OT Visit Diagnosis: Muscle weakness (generalized) (M62.81)                Time: 8916-9450 OT Time Calculation (min): 23 min Charges:  OT General Charges $OT Visit: 1 Visit OT Evaluation $OT Eval Low Complexity: 1 Low OT Treatments $Therapeutic Activity: 8-22 mins G-Codes:     [facility], OT/L  [phone] 09/16/2017  Teresa [name],Teresa [name] 09/16/2017, 12:48 PM

## 2017-09-16 NOTE — Evaluation (Signed)
Physical Therapy Evaluation Patient Details Name: Teresa [name] MRN: [id number] DOB: 03/25/1961 Today's Date: 09/16/2017   History of Present Illness  Pt is a 57 y/o female admitted secondary to L UE weakness. Pt found to have multiple small acute infarcts in right MCA left PCA left PICA territory suspicious for embolic etiology. PMH including but not limited to HTN, grave's disease.    Clinical Impression  Pt presented supine in bed with HOB elevated, awake and willing to participate in therapy session. Prior to admission, pt reported that she was independent with all functional mobility and ADLs. 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 Pt currently able to perform bed mobility with modified independence, transfers with supervision and ambulated in hallway without use of an AD with supervision for safety. Pt successfully completed stair training as well with no issues. No further acute PT needs identified at this time. PT signing off.     Follow Up Recommendations No PT follow up    Equipment Recommendations  None recommended by PT    Recommendations for Other Services       Precautions / Restrictions Precautions Precautions: None Restrictions Weight Bearing Restrictions: No      Mobility  Bed Mobility Overal bed mobility: Modified Independent                Transfers Overall transfer level: Needs assistance Equipment used: None Transfers: Sit to/from Stand Sit to Stand: Supervision         General transfer comment: supervision for safety  Ambulation/Gait Ambulation/Gait assistance: Supervision Ambulation Distance (Feet): 300 Feet Assistive device: None Gait Pattern/deviations: Step-through pattern;Decreased stride length Gait velocity: decreased Gait velocity interpretation: Below normal speed for age/gender General Gait Details: no instability or LOB, supervision for safety  Stairs Stairs: Yes Stairs assistance: Supervision Stair  Management: Two rails;Alternating pattern;Forwards Number of Stairs: 5 General stair comments: no instability or LOB, supervision for safety  Wheelchair Mobility    Modified Rankin (Stroke Patients Only) Modified Rankin (Stroke Patients Only) Pre-Morbid Rankin Score: No symptoms Modified Rankin: No significant disability     Balance Overall balance assessment: Needs assistance Sitting-balance support: Feet supported Sitting balance-Leahy Scale: Good     Standing balance support: No upper extremity supported;During functional activity Standing balance-Leahy Scale: Good                               Pertinent Vitals/Pain Pain Assessment: No/denies pain    Home Living Family/patient expects to be discharged to:: Private residence Living Arrangements: Children Available Help at Discharge: Family;Available PRN/intermittently Type of Home: House Home Access: Stairs to enter Entrance Stairs-Rails: Psychiatric nurse of Steps: "a couple" Home Layout: One level Home Equipment: Environmental consultant - 2 wheels      Prior Function Level of Independence: Independent               Hand Dominance   Dominant Hand: Left    Extremity/Trunk Assessment   Upper Extremity Assessment Upper Extremity Assessment: Defer to OT evaluation    Lower Extremity Assessment Lower Extremity Assessment: Overall WFL for tasks assessed       Communication   Communication: No difficulties  Cognition Arousal/Alertness: Awake/alert Behavior During Therapy: WFL for tasks assessed/performed;Flat affect Overall Cognitive Status: Within Functional Limits for tasks assessed  General Comments      Exercises     Assessment/Plan    PT Assessment Patent does not need any further PT services  PT Problem List         PT Treatment Interventions      PT Goals (Current goals can be found in the Care Plan section)   Acute Rehab PT Goals Patient Stated Goal: return home    Frequency     Barriers to discharge        Co-evaluation               AM-PAC PT "6 Clicks" Daily Activity  Outcome Measure Difficulty turning over in bed (including adjusting bedclothes, sheets and blankets)?: None Difficulty moving from lying on back to sitting on the side of the bed? : None Difficulty sitting down on and standing up from a chair with arms (e.g., wheelchair, bedside commode, etc,.)?: None Help needed moving to and from a bed to chair (including a wheelchair)?: None Help needed walking in hospital room?: None Help needed climbing 3-5 steps with a railing? : None 6 Click Score: 24    End of Session   Activity Tolerance: Patient tolerated treatment well Patient left: in bed;with call bell/phone within reach Nurse Communication: Mobility status PT Visit Diagnosis: Other symptoms and signs involving the nervous system (R29.898)    Time: 3338-3291 PT Time Calculation (min) (ACUTE ONLY): 20 min   Charges:   PT Evaluation $PT Eval Low Complexity: 1 Low     PT G Codes:        [place], PT, DPT [place] 09/16/2017, 12:28 PM

## 2017-09-16 NOTE — Progress Notes (Signed)
Paged dr about high bp; awaiting orders.

## 2017-09-17 DIAGNOSIS — R531 Weakness: Secondary | ICD-10-CM

## 2017-09-17 DIAGNOSIS — I10 Essential (primary) hypertension: Secondary | ICD-10-CM

## 2017-09-17 DIAGNOSIS — I6340 Cerebral infarction due to embolism of unspecified cerebral artery: Secondary | ICD-10-CM

## 2017-09-17 DIAGNOSIS — E876 Hypokalemia: Secondary | ICD-10-CM

## 2017-09-17 LAB — BASIC METABOLIC PANEL
Anion gap: 12 (ref 5–15)
BUN: 13 mg/dL (ref 6–20)
CO2: 24 mmol/L (ref 22–32)
Calcium: 9.5 mg/dL (ref 8.9–10.3)
Chloride: 104 mmol/L (ref 101–111)
Creatinine, Ser: 0.98 mg/dL (ref 0.44–1.00)
GFR calc Af Amer: >60 mL/min (ref >60)
GFR calc non Af Amer: >60 mL/min (ref >60)
Glucose, Bld: 108 mg/dL — ABNORMAL HIGH (ref 65–99)
Potassium: 3.4 mmol/L — ABNORMAL LOW (ref 3.5–5.1)
Sodium: 140 mmol/L (ref 135–145)

## 2017-09-17 LAB — CBC
HCT: 39 % (ref 36.0–46.0)
Hemoglobin: 12.7 g/dL (ref 12.0–15.0)
MCH: 26.4 pg (ref 26.0–34.0)
MCHC: 32.6 g/dL (ref 30.0–36.0)
MCV: 81.1 fL (ref 78.0–100.0)
Platelets: 233 10*3/uL (ref 150–400)
RBC: 4.81 MIL/uL (ref 3.87–5.11)
RDW: 13.6 % (ref 11.5–15.5)
WBC: 8.2 10*3/uL (ref 4.0–10.5)

## 2017-09-17 MED ORDER — WHITE PETROLATUM EX OINT
TOPICAL_OINTMENT | CUTANEOUS | Status: AC
  Administered 2017-09-18: 01:00:00
  Filled 2017-09-17: qty 28.35

## 2017-09-17 MED ORDER — LABETALOL HCL 5 MG/ML IV SOLN
10.0000 mg | INTRAVENOUS | Status: DC | PRN
  Administered 2017-09-18 (×3): 10 mg via INTRAVENOUS
  Filled 2017-09-17 (×3): qty 4

## 2017-09-17 MED ORDER — CARVEDILOL 12.5 MG PO TABS
12.5000 mg | ORAL_TABLET | Freq: Two times a day (BID) | ORAL | Status: DC
  Administered 2017-09-17 – 2017-09-18 (×2): 12.5 mg via ORAL
  Filled 2017-09-17 (×3): qty 1

## 2017-09-17 MED ORDER — PANTOPRAZOLE SODIUM 40 MG PO TBEC
40.0000 mg | DELAYED_RELEASE_TABLET | Freq: Every day | ORAL | Status: DC
  Administered 2017-09-17 – 2017-09-18 (×2): 40 mg via ORAL
  Filled 2017-09-17 (×2): qty 1

## 2017-09-17 MED ORDER — SALINE SPRAY 0.65 % NA SOLN
1.0000 | NASAL | Status: DC | PRN
  Administered 2017-09-17 (×2): 1 via NASAL
  Filled 2017-09-17: qty 44

## 2017-09-17 MED ORDER — POTASSIUM CHLORIDE CRYS ER 10 MEQ PO TBCR
30.0000 meq | EXTENDED_RELEASE_TABLET | Freq: Once | ORAL | Status: AC
  Administered 2017-09-17: 30 meq via ORAL
  Filled 2017-09-17: qty 1

## 2017-09-17 MED ORDER — POLYETHYLENE GLYCOL 3350 17 G PO PACK
17.0000 g | PACK | Freq: Every day | ORAL | Status: DC
  Administered 2017-09-17 – 2017-09-18 (×2): 17 g via ORAL
  Filled 2017-09-17 (×3): qty 1

## 2017-09-17 NOTE — Progress Notes (Addendum)
Family Medicine Teaching Service Daily Progress Note Intern Pager: [phone]  Patient name: Teresa [name] Medical record number: [id number] Date of birth: [birth year] Age: 57 y.o. Gender: female  Primary Care Provider: Kinnie Feil, MD Consultants: Neurology Code Status: Full  Pt Overview and Major Events to Date:  Admitted 3/22-CVA  Assessment and Plan: Teresa [name] is a 57 y.o. female past medical history significant for hypertension, history of Graves' disease now hypothyroid , hiatal hernia, migraine, chronic back pain who presented with acute onset of left arm weakness and found to have multiple small acute infarcts in right MCA left PCA left PICA territory suspicious for embolic etiology.  #Left arm weakness, acute, stable 2/2 Multiple Embolic CVA MRI showed multiple small infarcts R MCA, L PCA and L PICA distribution appears to be embolic in nature. CTA head and neck did not show any large vessel occlusion or significant stenosis of the arteries in the neck.  --Follow-up on neurology recommendations- TEE, Loop recorder- notified TEE scheduler for TEE to be performed. --ECHO with EF 60-65% and G1DD. Moderate AR. Mild MR. --Follow-up on a.m. CBC and BMP --Neuro checks --PT/OT/SLP- no further follow up  #Hypokalemia, imrpoving Reported some diarrhea two days ago.  --Daily BMPs --Replete as needed  #AKI, resolved  Likely prerenal in nature. This morning 0.98 down from 1.29 on admission. Patient is taking adequate po. --Follow-up on a.m. BMP  #Hypertension Still elevated. Needs gradual correction over 5-7 days. Now >48 hours from stroke.  --Resume coreg and spironolactone --May need to increase coreg for better control  #Mild elevated D dimers 0.54, low suspicion for PE. Likely secondary to current disease state. Will continue to monitor.   #Hypothyroidism Secondary to Graves' disease.  TSH within normal limits slightly elevated T4.  We will continue current  regimen.  We will recheck an outpatient setting in a few weeks. --Continue levothyroxine 100 mcg daily  #Tobacco abuse Patient will need counseling on smoking cessation in the setting of new CVA. --nicotine patch  #Hiatal hernia Patient currently on Protonix 40 mg daily. --Continue home medication  FEN/GI: heart healthy , PPI PPx: SCD's  Disposition: Home pending clinical improvement  Subjective:  Patient feeling better and feels as if her left arm is improving. She has been using it to write in order to help. She is still complaining of a headache and says that tylenol helps for a little it.   Objective: Temp:  [97.6 F (36.4 C)-98.9 F (37.2 C)] 98 F (36.7 C) (03/24 0439) Pulse Rate:  [70-80] 72 (03/24 0439) Resp:  [18] 18 (03/24 0439) BP: (150-188)/(62-82) 181/82 (03/24 0439) SpO2:  [96 %-100 %] 100 % (03/24 0439)   Physical Exam: General: NAD, pleasant Eyes: PERRL, EOMI, no conjunctival pallor or injection ENTM: Moist mucous membranes Cardiovascular: RRR, no m/r/g, no LE edema Respiratory: CTA BL, normal work of breathing Gastrointestinal: soft, nontender, nondistended, normoactive BS MSK: moves 4 extremities equally Derm: no rashes appreciated Neuro: alert and oriented x3, cranial nerves II through XII grossly intact. LUE strength 4/5 RUE strength 5/5. BLLE 5/5 strength.  Sensation intact upper and lower extremity bilaterally.   Psych: AOx3, appropriate affect  Laboratory: Recent Labs  Lab 09/15/17 1215 09/15/17 1228 09/16/17 0431 09/17/17 0528  WBC 9.2  --  8.9 8.2  HGB 14.2 15.3* 12.9 12.7  HCT 43.2 45.0 40.0 39.0  PLT 249  --  233 233   Recent Labs  Lab 09/15/17 1215 09/15/17 1228 09/16/17 0039 09/17/17 0528  NA 138 142  138 140  K 3.6 3.6 3.3* 3.4*  CL 104 105 104 104  CO2 24  --  24 24  BUN 12 13 13 13   CREATININE 1.29* 1.20* 0.95 0.98  CALCIUM 9.7  --  8.7* 9.5  PROT 7.2  --   --   --   BILITOT 0.6  --   --   --   ALKPHOS 87  --   --    --   ALT 21  --   --   --   AST 21  --   --   --   GLUCOSE 113* 108* 102* 108*   Lipid Panel     Component Value Date/Time   CHOL 171 09/15/2017 1217   TRIG 63 09/15/2017 1217   HDL 40 (L) 09/15/2017 1217   CHOLHDL 4.3 09/15/2017 1217   VLDL 13 09/15/2017 1217   LDLCALC 118 (H) 09/15/2017 1217   TSH: 2.407 Free T4 1.18 HIV nonreactive Hgb A1c 6.0  ECHO: Study Conclusions  - Left ventricle: The cavity size was normal. There was moderate   concentric hypertrophy. Systolic function was normal. The   estimated ejection fraction was in the range of 60% to 65%. [place]   motion was normal; there were no regional [place] motion   abnormalities. Doppler parameters are consistent with abnormal   left ventricular relaxation (grade 1 diastolic dysfunction).   Doppler parameters are consistent with elevated ventricular   end-diastolic filling pressure. - Aortic valve: There was moderate regurgitation. - Mitral valve: Calcified annulus. Mildly thickened leaflets .   There was mild regurgitation. - Left atrium: The atrium was mildly dilated. - Right ventricle: Systolic function was normal. - Tricuspid valve: There was mild regurgitation. - Pulmonic valve: There was trivial regurgitation. - Pulmonary arteries: The main pulmonary artery was normal-sized.   Systolic pressure was within the normal range. - Inferior vena cava: The vessel was normal in size. - Pericardium, extracardiac: There was no pericardial effusion.  Imaging/Diagnostic Tests: No results found.  Creg Gilmer, [place], DO 09/17/2017, 8:18 AM PGY-1, [place] Intern pager: [phone], text pages welcome

## 2017-09-17 NOTE — Plan of Care (Signed)
Pt asking for a small fan, turned room temp down.  Pr states she is having hot flashes, Estill Bamberg Charge asked to order fan.

## 2017-09-17 NOTE — Plan of Care (Addendum)
3W12  asking for saline, eye drops per c/o dry itching eyes.  Messaged MD  Pt changed mind and asking for vaseline to coat her eyelids.

## 2017-09-17 NOTE — Discharge Summary (Signed)
[facility] Discharge Summary  Patient name: Teresa [name] Medical record number: [id number] Date of birth: 1961-04-26 Age: 57 y.o. Gender: female Date of Admission: 09/15/2017  Date of Discharge: 09/18/17 Admitting Physician: Leeanne Rio, MD  Primary Care Provider: Kinnie Feil, MD Consultants: Neurology  Indication for Hospitalization: Left arm weakness, HA  Discharge Diagnoses/Problem List:  Left arm weakness 2/2 Multiple Embolic CVA of R MCA, L PCA and L PICA  Hypokalemia AKI  Hypertension Mild elevated D dimer Hypothyroidism Tobacco abuse Hiatal hernia  Disposition: home  Discharge Condition: stable  Discharge Exam:  General: NAD, pleasant, SCDs in place Eyes: PERRL, EOMI, no conjunctival pallor or injection ENTM: Moist mucous membranes Cardiovascular: RRR, no m/r/g, no LE edema Respiratory: CTA BL, normal work of breathing Gastrointestinal: soft, nontender, nondistended, normoactive BS MSK: moves 4 extremities equally Derm: no rashes appreciated Neuro: CN II-XII grossly intact, strength 5/5 in BLLE and BLUE Psych: AOx3, appropriate affect  Brief Hospital Course:  This 57 year old female presented to the ED with left arm weakness and headache.  In the ED CT head showed multiple small infarcts suspicious for embolic stroke.  Neurology was consulted for further assessment.  Patient was admitted for stroke workup.  Patient also with an AK I on admission with creatinine of 1.29 which improved after IV fluids.  While admitted patient's strength and function return to left arm.  Patient continued to have headache throughout stay with uncontrolled blood pressure.  Permissive hypertension was allowed for the first 48 hours in the right neurology recommended slowly correcting blood pressure over 5-7 days.  Patient's workup included echocardiogram as well as TEE.  Results are listed below and showed normal function with no cause for embolic  stroke.  Patient also had hypercoagulable and autoimmune workup, which were negative.  Outpatient occupational therapy to see patient after discharge.  Patient also had loop recorder placed prior to discharge.  Issues for Follow Up:  1. Patient with uncontrolled blood pressure on admission.  Started on hydralazine 50 mg 3 times daily, spironolactone 25 mg daily, carvedilol 12.5 mg daily.  Ensure patient has gradual correction of hypertension. 2. Patient to have occupational therapy as outpatient, ensure the patient has gotten this. 3. Nicotine patch provided for smoking cessation, ensure patient has resources to help her stop smoking. 4. Patient to continue taking atorvastatin 40 mg daily.  Significant Procedures: TEE  Significant Labs and Imaging:  Recent Labs  Lab 09/16/17 0431 09/17/17 0528 09/18/17 0412  WBC 8.9 8.2 6.9  HGB 12.9 12.7 13.8  HCT 40.0 39.0 42.0  PLT 233 233 248   Recent Labs  Lab 09/15/17 1215 09/15/17 1228 09/16/17 0039 09/17/17 0528 09/18/17 0412  NA 138 142 138 140 138  K 3.6 3.6 3.3* 3.4* 3.4*  CL 104 105 104 104 103  CO2 24  --  24 24 24   GLUCOSE 113* 108* 102* 108* 93  BUN 12 13 13 13 10   CREATININE 1.29* 1.20* 0.95 0.98 0.87  CALCIUM 9.7  --  8.7* 9.5 9.6  ALKPHOS 87  --   --   --   --   AST 21  --   --   --   --   ALT 21  --   --   --   --   ALBUMIN 4.0  --   --   --   --    Ct Angio Head W Or Wo Contrast Ct Angio Neck W Or Wo Contrast 09/15/2017  IMPRESSION:  1. No emergent large vessel occlusion or hemodynamically significant stenosis of the arteries of the head and neck.  2. Minimal bilateral carotid bifurcation atherosclerotic calcification without stenosis.   Ct Head Wo Contrast 09/15/2017 IMPRESSION:  No acute intracranial abnormality. Mild ethmoid sinusitis.   Mr Brain Wo Contrast 09/15/2017 IMPRESSION:  Multiple small acute infarcts involving the right MCA territory, left PCA territory, and left PICA territory. Findings suggest  multiple emboli. Chronic microhemorrhage right posterior temporal lobe and right thalamus possibly due to hypertension.   Transthoracic Echocardiogram - Left ventricle: The cavity size was normal. There was moderate concentric hypertrophy. Systolic function was normal. Theestimated ejection fraction was in the range of 60% to 65%. Wallmotion was normal; there were no regional Jann motionabnormalities. Doppler parameters are consistent with abnormalleft ventricular relaxation (grade 1 diastolic dysfunction).Doppler parameters are consistent with elevated ventricularend-diastolic filling pressure. - Aortic valve: There was moderate regurgitation. - Mitral valve: Calcified annulus. Mildly thickened leaflets .There was mild regurgitation. - Left atrium: The atrium was mildly dilated. - Right ventricle: Systolic function was normal. - Tricuspid valve: There was mild regurgitation. - Pulmonic valve: There was trivial regurgitation. - Pulmonary arteries: The main pulmonary artery was normal-sized.Systolic pressure was within the normal range. - Inferior vena cava: The vessel was normal in size. - Pericardium, extracardiac: There was no pericardial effusion.  TEE 5. No LAA thrombus 6. Negative for PFO 7. Mild LAE 8. Trace to mild MR 9. Mild central AI 10. LVEF 60-65%  TSH: 2.407 Free T4 1.18 HIV nonreactive Hgb A1c 6.0 Sed rate 20 CRP 0.8 Ds DNA ab 1 ANA Ab neg Sickle Cell neg No lupus anticoagulant noted Beta-2 Glyco I IgG <9 Homocysteine 12.3 Cardiolipin antibodies negative  Results/Tests Pending at Time of Discharge: none  Discharge Medications:  Allergies as of 09/18/2017      Reactions   Bactrim [sulfamethoxazole-trimethoprim] Anaphylaxis   Clonidine Derivatives Swelling, Other (See Comments)   Facial swelling   Amlodipine Swelling   Leg swelling   Lisinopril Swelling   Lip swelling   Aspirin Nausea Only   Latex Rash      Medication List    STOP taking  these medications   cyclobenzaprine 10 MG tablet Commonly known as:  FLEXERIL   oxymetazoline 0.05 % nasal spray Commonly known as:  AFRIN     TAKE these medications   aspirin 81 MG chewable tablet Chew 1 tablet (81 mg total) by mouth daily.   atorvastatin 40 MG tablet Commonly known as:  LIPITOR Take 1 tablet (40 mg total) by mouth daily at 6 PM.   BLINK TEARS OP Place 1 drop into both eyes 4 (four) times daily as needed (dry eyes).   carvedilol 12.5 MG tablet Commonly known as:  COREG Take 1 tablet (12.5 mg total) by mouth 2 (two) times daily with a meal. What changed:    medication strength  how much to take   cyanocobalamin 500 MCG tablet Take 500 mcg by mouth daily.   cycloSPORINE 0.05 % ophthalmic emulsion Commonly known as:  RESTASIS Place 1 drop into both eyes 2 (two) times daily.   hydrALAZINE 50 MG tablet Commonly known as:  APRESOLINE Take 1 tablet (50 mg total) by mouth every 8 (eight) hours.   levothyroxine 112 MCG tablet Commonly known as:  SYNTHROID, LEVOTHROID TAKE ONE TABLET BY MOUTH ONCE DAILY BEFORE BREAKFAST What changed:    how much to take  how to take this  when to take this  additional  instructions   mometasone 50 MCG/ACT nasal spray Commonly known as:  NASONEX Place 2 sprays into the nose daily.   nicotine 14 mg/24hr patch Commonly known as:  NICODERM CQ - dosed in mg/24 hours Place 1 patch (14 mg total) onto the skin daily.   pantoprazole 40 MG tablet Commonly known as:  PROTONIX Take 1 tablet (40 mg total) by mouth daily. What changed:  when to take this   spironolactone 25 MG tablet Commonly known as:  ALDACTONE TAKE 1 TABLET BY MOUTH ONCE DAILY What changed:    how much to take  how to take this  when to take this  additional instructions   VITAMIN D PO Take 1 tablet by mouth daily.       Discharge Instructions: Please refer to Patient Instructions section of EMR for full details.  Patient was counseled  important signs and symptoms that should prompt return to medical care, changes in medications, dietary instructions, activity restrictions, and follow up appointments.   Follow-Up Appointments: Follow-up Information    [facility] Follow up on 10/02/2017.   Specialty:  Cardiology Why:  4:30PM, wound check visit Contact information: [address] [place] [phone]       [facility]. Schedule an appointment as soon as possible for a visit in 4 week(s).   Specialty:  Neurology Contact information: [address] [place] [phone]          Hazley Dezeeuw, [place], DO 09/20/2017, 8:37 AM PGY-1, [place]

## 2017-09-17 NOTE — Plan of Care (Addendum)
3W12 BP 181/84, no PRN bp meds, do you have a high limit for BP you can place in a Note for me?    MD responded and new order on chart.

## 2017-09-17 NOTE — Progress Notes (Addendum)
STROKE TEAM PROGRESS NOTE   SUBJECTIVE (INTERVAL HISTORY) Her RN is at the bedside.  Pending TEE/loop recorder tomorrow.  BP still high not in good control.  Will increase BP meds   OBJECTIVE Temp:  [97.6 F (36.4 C)-98.9 F (37.2 C)] 98 F (36.7 C) (03/24 1124) Pulse Rate:  [69-80] 69 (03/24 1124) Cardiac Rhythm: Normal sinus rhythm (03/24 0700) Resp:  [17-18] 17 (03/24 1124) BP: (150-188)/(62-82) 181/79 (03/24 1124) SpO2:  [96 %-100 %] 99 % (03/24 1124)  CBC:  Recent Labs  Lab 09/15/17 1215  09/16/17 0431 09/17/17 0528  WBC 9.2  --  8.9 8.2  NEUTROABS 5.8  --   --   --   HGB 14.2   < > 12.9 12.7  HCT 43.2   < > 40.0 39.0  MCV 81.5  --  82.1 81.1  PLT 249  --  233 233   < > = values in this interval not displayed.    Basic Metabolic Panel:  Recent Labs  Lab 09/16/17 0039 09/17/17 0528  NA 138 140  K 3.3* 3.4*  CL 104 104  CO2 24 24  GLUCOSE 102* 108*  BUN 13 13  CREATININE 0.95 0.98  CALCIUM 8.7* 9.5    Lipid Panel:     Component Value Date/Time   CHOL 171 09/15/2017 1217   TRIG 63 09/15/2017 1217   HDL 40 (L) 09/15/2017 1217   CHOLHDL 4.3 09/15/2017 1217   VLDL 13 09/15/2017 1217   LDLCALC 118 (H) 09/15/2017 1217   HgbA1c:  Lab Results  Component Value Date   HGBA1C 6.0 (H) 09/15/2017   Urine Drug Screen:     Component Value Date/Time   LABOPIA NONE DETECTED 02/09/2010 2358   COCAINSCRNUR NONE DETECTED 02/09/2010 2358   LABBENZ NONE DETECTED 02/09/2010 2358   AMPHETMU NONE DETECTED 02/09/2010 2358   THCU NONE DETECTED 02/09/2010 2358   LABBARB  02/09/2010 2358    NONE DETECTED        DRUG SCREEN FOR MEDICAL PURPOSES ONLY.  IF CONFIRMATION IS NEEDED FOR ANY PURPOSE, NOTIFY LAB WITHIN 5 DAYS.        LOWEST DETECTABLE LIMITS FOR URINE DRUG SCREEN Drug Class       Cutoff (ng/mL) Amphetamine      1000 Barbiturate      200 Benzodiazepine   419 Tricyclics       622 Opiates          300 Cocaine          300 THC              50     Alcohol Level No results found for: [place] I have personally reviewed the radiological images below and agree with the radiology interpretations.  Ct Angio Head W Or Wo Contrast Ct Angio Neck W Or Wo Contrast 09/15/2017 IMPRESSION:  1. No emergent large vessel occlusion or hemodynamically significant stenosis of the arteries of the head and neck.  2. Minimal bilateral carotid bifurcation atherosclerotic calcification without stenosis.   Ct Head Wo Contrast 09/15/2017 IMPRESSION:  No acute intracranial abnormality. Mild ethmoid sinusitis.   Mr Brain Wo Contrast 09/15/2017 IMPRESSION:  Multiple small acute infarcts involving the right MCA territory, left PCA territory, and left PICA territory. Findings suggest multiple emboli. Chronic microhemorrhage right posterior temporal lobe and right thalamus possibly due to hypertension.   Transthoracic Echocardiogram - Left ventricle: The cavity size was normal. There was moderate   concentric hypertrophy. Systolic function  was normal. The   estimated ejection fraction was in the range of 60% to 65%. [place]   motion was normal; there were no regional [place] motion   abnormalities. Doppler parameters are consistent with abnormal   left ventricular relaxation (grade 1 diastolic dysfunction).   Doppler parameters are consistent with elevated ventricular   end-diastolic filling pressure. - Aortic valve: There was moderate regurgitation. - Mitral valve: Calcified annulus. Mildly thickened leaflets .   There was mild regurgitation. - Left atrium: The atrium was mildly dilated. - Right ventricle: Systolic function was normal. - Tricuspid valve: There was mild regurgitation. - Pulmonic valve: There was trivial regurgitation. - Pulmonary arteries: The main pulmonary artery was normal-sized.   Systolic pressure was within the normal range. - Inferior vena cava: The vessel was normal in size. - Pericardium, extracardiac: There was no pericardial  effusion.  LE venous doppler pending   PHYSICAL EXAM  Temp:  [97.6 F (36.4 C)-98.9 F (37.2 C)] 98 F (36.7 C) (03/24 1124) Pulse Rate:  [69-80] 69 (03/24 1124) Resp:  [17-18] 17 (03/24 1124) BP: (150-188)/(62-82) 181/79 (03/24 1124) SpO2:  [96 %-100 %] 99 % (03/24 1124)  General - Well nourished, well developed, in no apparent distress.  Ophthalmologic - Fundi not visualized due to noncooperation.  Cardiovascular - Regular rate and rhythm.  Mental Status -  Level of arousal and orientation to time, place, and person were intact. Language including expression, naming, repetition, comprehension was assessed and found intact. Fund of Knowledge was assessed and was intact.  Cranial Nerves II - XII - II - Visual field intact OU. III, IV, VI - Extraocular movements intact, however baseline lazy eyes on the left. V - Facial sensation intact bilaterally. VII - Facial movement intact bilaterally. VIII - Hearing & vestibular intact bilaterally. X - Palate elevates symmetrically. XI - Chin turning & shoulder shrug intact bilaterally. XII - Tongue protrusion intact.  Motor Strength - The patient's strength was normal in all extremities and pronator drift was absent.  Bulk was normal and fasciculations were absent.   Motor Tone - Muscle tone was assessed at the neck and appendages and was normal.  Reflexes - The patient's reflexes were 1+ in all extremities and she had no pathological reflexes.  Sensory - Light touch, temperature/pinprick were assessed and were symmetrical.    Coordination - The patient had normal movements in the hands with no ataxia or dysmetria.  Tremor was absent.  Gait and Station -deferred   ASSESSMENT/PLAN [name] is a 57 y.o. female with history of prediabetes, history of palpitations, positive TB test, history of Graves' disease, history of migraines, colon cancer, and current smoker presenting with left-sided weakness, blurred vision,  ataxia, and elevated blood pressure. She did not receive IV t-PA due to late presentation.  Stroke: Multiple bilateral infarcts consistent with emboli source unknown  Resultant back to baseline  CT head -no acute abnormality  MRI head - Multiple small acute infarcts involving the right MCA territory, right MCA/ACA, left MCA/PCA, left cerebellum. Chronic microhemorrhage possibly due to hypertension.   CTA H&N -unremarkable  2D Echo - EF 60-65%  TEE/loop recorder recommended for embolic workup. NPO after midnight.  LE venous doppler pending  LDL 118  HgbA1c - 6.0  UDS pending  Hypercoagulable and autoimmune work up pending  VTE prophylaxis - SCDs Fall precautions Diet Heart Room service appropriate? Yes; Fluid consistency: Thin  No antithrombotic prior to admission, now on aspirin 81 mg daily.  Patient counseled  to be compliant with her antithrombotic medications  Ongoing aggressive stroke risk factor management  Therapy recommendations:  Outpt OT  Disposition:  Pending  Hypertension  Still high  Gradual BP normalization   Resume home medication of Coreg and spironolactone  Increase coreg dose  Long-term BP goal normotensive  Hyperlipidemia  Lipid lowering medication PTA:  none  LDL 118, goal < 70  Now on Lipitor 20 mg daily  Continue statin at discharge  Tobacco abuse  Current smoker  Smoking cessation counseling provided  Nicotine patch provided  Pt is willing to quit  Other Stroke Risk Factors  Obesity, Body mass index is 31.51 kg/m., recommend weight loss, diet and exercise as appropriate   Family hx stroke (father)  Migraines  Other Active Problems  Mild hypokalemia 3.3 - supplemented -> 3.4  Graves' disease status post radiation -now on Synthroid, TSH normal  Hospital day # 2  Rosalin Hawking, MD PhD Stroke Neurology 09/17/2017 12:03 PM   To contact Stroke Continuity provider, please refer to [email or web address] After hours, contact  General Neurology

## 2017-09-17 NOTE — Progress Notes (Signed)
Discussed with nurse, primary team seems to be gradually normalizing BP, will add PRN labetalol for SBP > 185/110

## 2017-09-17 NOTE — Plan of Care (Signed)
Prune juice 267ml give per c/o constipation, difficulty moving bowels when already given Miralax earlier on day shift.

## 2017-09-18 ENCOUNTER — Encounter (HOSPITAL_COMMUNITY): Payer: Self-pay

## 2017-09-18 ENCOUNTER — Inpatient Hospital Stay (HOSPITAL_COMMUNITY): Payer: BLUE CROSS/BLUE SHIELD

## 2017-09-18 ENCOUNTER — Encounter (HOSPITAL_COMMUNITY)
Admission: EM | Disposition: A | Discharge status: Home / Self Care | Payer: Self-pay | Source: Home / Self Care | Attending: Family Medicine

## 2017-09-18 ENCOUNTER — Inpatient Hospital Stay (HOSPITAL_COMMUNITY)
Admit: 2017-09-18 | Discharge: 2017-09-18 | Disposition: A | Discharge status: Home / Self Care | Payer: BLUE CROSS/BLUE SHIELD | Attending: Cardiology | Admitting: Cardiology

## 2017-09-18 DIAGNOSIS — I639 Cerebral infarction, unspecified: Secondary | ICD-10-CM

## 2017-09-18 DIAGNOSIS — K5900 Constipation, unspecified: Secondary | ICD-10-CM

## 2017-09-18 DIAGNOSIS — I351 Nonrheumatic aortic (valve) insufficiency: Secondary | ICD-10-CM

## 2017-09-18 DIAGNOSIS — E785 Hyperlipidemia, unspecified: Secondary | ICD-10-CM

## 2017-09-18 DIAGNOSIS — I6389 Other cerebral infarction: Secondary | ICD-10-CM

## 2017-09-18 HISTORY — PX: LOOP RECORDER INSERTION: EP1214

## 2017-09-18 HISTORY — PX: TEE WITHOUT CARDIOVERSION: SHX5443

## 2017-09-18 LAB — CBC
HCT: 42 % (ref 36.0–46.0)
Hemoglobin: 13.8 g/dL (ref 12.0–15.0)
MCH: 26.7 pg (ref 26.0–34.0)
MCHC: 32.9 g/dL (ref 30.0–36.0)
MCV: 81.4 fL (ref 78.0–100.0)
Platelets: 248 10*3/uL (ref 150–400)
RBC: 5.16 MIL/uL — ABNORMAL HIGH (ref 3.87–5.11)
RDW: 13.7 % (ref 11.5–15.5)
WBC: 6.9 10*3/uL (ref 4.0–10.5)

## 2017-09-18 LAB — BASIC METABOLIC PANEL
Anion gap: 11 (ref 5–15)
BUN: 10 mg/dL (ref 6–20)
CO2: 24 mmol/L (ref 22–32)
Calcium: 9.6 mg/dL (ref 8.9–10.3)
Chloride: 103 mmol/L (ref 101–111)
Creatinine, Ser: 0.87 mg/dL (ref 0.44–1.00)
GFR calc Af Amer: >60 mL/min (ref >60)
GFR calc non Af Amer: >60 mL/min (ref >60)
Glucose, Bld: 93 mg/dL (ref 65–99)
Potassium: 3.4 mmol/L — ABNORMAL LOW (ref 3.5–5.1)
Sodium: 138 mmol/L (ref 135–145)

## 2017-09-18 LAB — SEDIMENTATION RATE: Sed Rate: 20 mm/hr (ref 0–22)

## 2017-09-18 LAB — C-REACTIVE PROTEIN: CRP: 0.8 mg/dL (ref <1.0)

## 2017-09-18 SURGERY — LOOP RECORDER INSERTION

## 2017-09-18 SURGERY — ECHOCARDIOGRAM, TRANSESOPHAGEAL
Anesthesia: Moderate Sedation

## 2017-09-18 MED ORDER — ATORVASTATIN CALCIUM 40 MG PO TABS
40.0000 mg | ORAL_TABLET | Freq: Every day | ORAL | Status: DC
  Administered 2017-09-18: 40 mg via ORAL
  Filled 2017-09-18: qty 1

## 2017-09-18 MED ORDER — SENNOSIDES-DOCUSATE SODIUM 8.6-50 MG PO TABS: 1.0000 | ORAL_TABLET | Freq: Every day | ORAL | Status: DC | PRN

## 2017-09-18 MED ORDER — PANTOPRAZOLE SODIUM 40 MG PO TBEC: 40.0000 mg | DELAYED_RELEASE_TABLET | Freq: Every day | ORAL | 0 refills | Status: DC

## 2017-09-18 MED ORDER — FENTANYL CITRATE (PF) 100 MCG/2ML IJ SOLN
INTRAMUSCULAR | Status: DC | PRN
  Administered 2017-09-18 (×3): 25 ug via INTRAVENOUS

## 2017-09-18 MED ORDER — HYDRALAZINE HCL 25 MG PO TABS
25.0000 mg | ORAL_TABLET | Freq: Three times a day (TID) | ORAL | Status: DC
  Administered 2017-09-18: 25 mg via ORAL
  Filled 2017-09-18: qty 1

## 2017-09-18 MED ORDER — HYDRALAZINE HCL 50 MG PO TABS
50.0000 mg | ORAL_TABLET | Freq: Three times a day (TID) | ORAL | Status: DC
  Administered 2017-09-18: 50 mg via ORAL
  Filled 2017-09-18: qty 1

## 2017-09-18 MED ORDER — CARVEDILOL 12.5 MG PO TABS: 12.5000 mg | ORAL_TABLET | Freq: Two times a day (BID) | ORAL | 0 refills | Status: DC

## 2017-09-18 MED ORDER — MIDAZOLAM HCL 10 MG/2ML IJ SOLN
INTRAMUSCULAR | Status: DC | PRN
  Administered 2017-09-18: 2 mg via INTRAVENOUS
  Administered 2017-09-18: 1 mg via INTRAVENOUS
  Administered 2017-09-18: 2 mg via INTRAVENOUS

## 2017-09-18 MED ORDER — LIDOCAINE-EPINEPHRINE 1 %-1:100000 IJ SOLN
INTRAMUSCULAR | Status: AC
  Filled 2017-09-18: qty 1

## 2017-09-18 MED ORDER — LIDOCAINE-EPINEPHRINE 1 %-1:100000 IJ SOLN
INTRAMUSCULAR | Status: DC | PRN
  Administered 2017-09-18: 20 mL

## 2017-09-18 MED ORDER — ASPIRIN 81 MG PO CHEW: 81.0000 mg | CHEWABLE_TABLET | Freq: Every day | ORAL | 0 refills | Status: AC

## 2017-09-18 MED ORDER — MIDAZOLAM HCL 5 MG/ML IJ SOLN
INTRAMUSCULAR | Status: AC
  Filled 2017-09-18: qty 2

## 2017-09-18 MED ORDER — BUTAMBEN-TETRACAINE-BENZOCAINE 2-2-14 % EX AERO
INHALATION_SPRAY | CUTANEOUS | Status: DC | PRN
  Administered 2017-09-18: 2 via TOPICAL

## 2017-09-18 MED ORDER — SODIUM CHLORIDE 0.9 % IV SOLN
INTRAVENOUS | Status: DC
  Administered 2017-09-18: 12:00:00 via INTRAVENOUS

## 2017-09-18 MED ORDER — FENTANYL CITRATE (PF) 100 MCG/2ML IJ SOLN
INTRAMUSCULAR | Status: AC
Start: 2017-09-18 — End: ?
  Filled 2017-09-18: qty 2

## 2017-09-18 MED ORDER — ATORVASTATIN CALCIUM 40 MG PO TABS: 40.0000 mg | ORAL_TABLET | Freq: Every day | ORAL | 0 refills | Status: DC

## 2017-09-18 MED ORDER — NICOTINE 14 MG/24HR TD PT24: 14.0000 mg | MEDICATED_PATCH | Freq: Every day | TRANSDERMAL | 0 refills | Status: DC

## 2017-09-18 MED ORDER — POTASSIUM CHLORIDE CRYS ER 20 MEQ PO TBCR
40.0000 meq | EXTENDED_RELEASE_TABLET | Freq: Once | ORAL | Status: AC
  Administered 2017-09-18: 40 meq via ORAL
  Filled 2017-09-18: qty 2

## 2017-09-18 MED ORDER — HYDRALAZINE HCL 50 MG PO TABS: 50.0000 mg | ORAL_TABLET | Freq: Three times a day (TID) | ORAL | 0 refills | Status: DC

## 2017-09-18 SURGICAL SUPPLY — 2 items
LOOP REVEAL LINQSYS (Prosthesis & Implant Heart) ×1 IMPLANT
PACK LOOP INSERTION (CUSTOM PROCEDURE TRAY) ×2 IMPLANT

## 2017-09-18 NOTE — Interval H&P Note (Signed)
History and Physical Interval Note:  09/18/2017 4:52 PM  Teresa [name]  has presented today for surgery, with the diagnosis of stroke  The various methods of treatment have been discussed with the patient and family. After consideration of risks, benefits and other options for treatment, the patient has consented to  Procedure(s): LOOP RECORDER INSERTION (N/A) as a surgical intervention .  The patient's history has been reviewed, patient examined, no change in status, stable for surgery.  I have reviewed the patient's chart and labs.  Questions were answered to the patient's satisfaction.     Cristopher Peru

## 2017-09-18 NOTE — Progress Notes (Addendum)
Family Medicine Teaching Service Daily Progress Note Intern Pager: [phone]  Patient name: Teresa [name] Medical record number: [id number] Date of birth: 1961/03/15 Age: 57 y.o. Gender: female  Primary Care Provider: Kinnie Feil, MD Consultants: Neurology Code Status: Full  Pt Overview and Major Events to Date:  Admitted 3/22-CVA  Assessment and Plan: Teresa [name] is a 57 y.o. female past medical history significant for hypertension, history of Graves' disease now hypothyroid , hiatal hernia, migraine, chronic back pain who presented with acute onset of left arm weakness and found to have multiple small acute infarcts in right MCA left PCA left PICA territory suspicious for embolic etiology.  #Left arm weakness, acute, stable 2/2 Multiple Embolic CVA MRI showed multiple small infarcts R MCA, L PCA and L PICA distribution appears to be embolic in nature.  --Follow-up on neurology recommendations- TEE, Loop recorder today. Autoimmune and Anticoagulation work up: Sed rate 20, CRP 0.8. --LE Venous Doppler pending --Neuro checks --PT/OT/SLP- no further follow up --Increased atorvastatin to 40mg  daily --PRN labetalol for SBP > 185/110 --increase atorvastatin to 40mg  daily, continue ASA 81mg   #Hypokalemia, stable Reported some diarrhea two days ago.  --Daily BMPs --Replete as needed  #AKI, resolved  Likely prerenal in nature. This morning 0.98 down from 1.29 on admission. Patient is taking adequate po. --Follow-up on a.m. BMP  #Hypertension Still elevated. Needs gradual correction over 5-7 days. Now >48 hours from stroke.  --Resume coreg and spironolactone --May need to increase coreg for better control  #Mild elevated D dimers 0.54, low suspicion for PE. Likely secondary to current disease state. Will continue to monitor.   #Hypothyroidism Secondary to Graves' disease.  TSH within normal limits slightly elevated T4.  We will continue current regimen.  We will recheck  an outpatient setting in a few weeks. --Continue levothyroxine 100 mcg daily  #Tobacco abuse Patient will need counseling on smoking cessation in the setting of new CVA. --nicotine patch  #Hiatal hernia Patient currently on Protonix 40 mg daily. --Continue home medication  #Constipation Patient with no bm since Thursday --Continue miralax, and senna-docusate as needed  FEN/GI: heart healthy , PPI PPx: SCD's  Disposition: Home pending clinical improvement  Subjective:  Patient complaining this morning of constipation.  Patient reports last bowel movement was Thursday and she is tried [place] and many juices.  Patient also ready to go home and she is not getting good rest here in the hospital.  She is looking forward to getting this test over with.  Objective: Temp:  [98 F (36.7 C)-98.4 F (36.9 C)] 98 F (36.7 C) (03/25 0822) Pulse Rate:  [60-69] 63 (03/25 0822) Resp:  [16-18] 16 (03/25 0822) BP: (154-202)/(76-95) 196/88 (03/25 0822) SpO2:  [93 %-100 %] 93 % (03/25 2440)   Physical Exam: General: NAD, pleasant, SCDs in place Eyes: PERRL, EOMI, no conjunctival pallor or injection ENTM: Moist mucous membranes Cardiovascular: RRR, no m/r/g, no LE edema Respiratory: CTA BL, normal work of breathing Gastrointestinal: soft, nontender, nondistended, normoactive BS MSK: moves 4 extremities equally Derm: no rashes appreciated Neuro: CN II-XII grossly intact, strength 5/5 in BLLE and BLUE Psych: AOx3, appropriate affect  Laboratory: Recent Labs  Lab 09/16/17 0431 09/17/17 0528 09/18/17 0412  WBC 8.9 8.2 6.9  HGB 12.9 12.7 13.8  HCT 40.0 39.0 42.0  PLT 233 233 248   Recent Labs  Lab 09/15/17 1215  09/16/17 0039 09/17/17 0528 09/18/17 0412  NA 138   < > 138 140 138  K 3.6   < >  3.3* 3.4* 3.4*  CL 104   < > 104 104 103  CO2 24  --  24 24 24   BUN 12   < > 13 13 10   CREATININE 1.29*   < > 0.95 0.98 0.87  CALCIUM 9.7  --  8.7* 9.5 9.6  PROT 7.2  --   --   --    --   BILITOT 0.6  --   --   --   --   ALKPHOS 87  --   --   --   --   ALT 21  --   --   --   --   AST 21  --   --   --   --   GLUCOSE 113*   < > 102* 108* 93   < > = values in this interval not displayed.   Lipid Panel     Component Value Date/Time   CHOL 171 09/15/2017 1217   TRIG 63 09/15/2017 1217   HDL 40 (L) 09/15/2017 1217   CHOLHDL 4.3 09/15/2017 1217   VLDL 13 09/15/2017 1217   LDLCALC 118 (H) 09/15/2017 1217   TSH: 2.407 Free T4 1.18 HIV nonreactive Hgb A1c 6.0 Sed rate 20 CRP 0.8  ECHO: Study Conclusions  - Left ventricle: The cavity size was normal. There was moderate   concentric hypertrophy. Systolic function was normal. The   estimated ejection fraction was in the range of 60% to 65%. [place]   motion was normal; there were no regional [place] motion   abnormalities. Doppler parameters are consistent with abnormal   left ventricular relaxation (grade 1 diastolic dysfunction).   Doppler parameters are consistent with elevated ventricular   end-diastolic filling pressure. - Aortic valve: There was moderate regurgitation. - Mitral valve: Calcified annulus. Mildly thickened leaflets .   There was mild regurgitation. - Left atrium: The atrium was mildly dilated. - Right ventricle: Systolic function was normal. - Tricuspid valve: There was mild regurgitation. - Pulmonic valve: There was trivial regurgitation. - Pulmonary arteries: The main pulmonary artery was normal-sized.   Systolic pressure was within the normal range. - Inferior vena cava: The vessel was normal in size. - Pericardium, extracardiac: There was no pericardial effusion.  Imaging/Diagnostic Tests: No results found.  Almir Botts, [place], DO 09/18/2017, 9:27 AM PGY-1, [facility] Intern pager: [phone], text pages welcome

## 2017-09-18 NOTE — Progress Notes (Signed)
  Echocardiogram Echocardiogram Transesophageal has been performed.  Jannett Celestine 09/18/2017, 12:29 PM

## 2017-09-18 NOTE — Progress Notes (Addendum)
STROKE TEAM PROGRESS NOTE   SUBJECTIVE (INTERVAL HISTORY) No family present. Pt lying in bed watching TV. Complains of HA in the back of her head and upper mid back pain. Mid back musculature tight, pain relieved with massage. BP remains elevated, up to 202/95.   OBJECTIVE Temp:  [98 F (36.7 C)-98.4 F (36.9 C)] 98 F (36.7 C) (03/25 0822) Pulse Rate:  [60-69] 63 (03/25 0822) Cardiac Rhythm: Normal sinus rhythm (03/25 0800) Resp:  [16-18] 16 (03/25 0822) BP: (154-202)/(76-95) 196/88 (03/25 0822) SpO2:  [93 %-100 %] 93 % (03/25 0822)  CBC:  Recent Labs  Lab 09/15/17 1215  09/17/17 0528 09/18/17 0412  WBC 9.2   < > 8.2 6.9  NEUTROABS 5.8  --   --   --   HGB 14.2   < > 12.7 13.8  HCT 43.2   < > 39.0 42.0  MCV 81.5   < > 81.1 81.4  PLT 249   < > 233 248   < > = values in this interval not displayed.    Basic Metabolic Panel:  Recent Labs  Lab 09/17/17 0528 09/18/17 0412  NA 140 138  K 3.4* 3.4*  CL 104 103  CO2 24 24  GLUCOSE 108* 93  BUN 13 10  CREATININE 0.98 0.87  CALCIUM 9.5 9.6    Lipid Panel:     Component Value Date/Time   CHOL 171 09/15/2017 1217   TRIG 63 09/15/2017 1217   HDL 40 (L) 09/15/2017 1217   CHOLHDL 4.3 09/15/2017 1217   VLDL 13 09/15/2017 1217   LDLCALC 118 (H) 09/15/2017 1217   HgbA1c:  Lab Results  Component Value Date   HGBA1C 6.0 (H) 09/15/2017   Urine Drug Screen:     Component Value Date/Time   LABOPIA NONE DETECTED 02/09/2010 2358   COCAINSCRNUR NONE DETECTED 02/09/2010 2358   LABBENZ NONE DETECTED 02/09/2010 2358   AMPHETMU NONE DETECTED 02/09/2010 2358   THCU NONE DETECTED 02/09/2010 2358   LABBARB  02/09/2010 2358    NONE DETECTED        DRUG SCREEN FOR MEDICAL PURPOSES ONLY.  IF CONFIRMATION IS NEEDED FOR ANY PURPOSE, NOTIFY LAB WITHIN 5 DAYS.        LOWEST DETECTABLE LIMITS FOR URINE DRUG SCREEN Drug Class       Cutoff (ng/mL) Amphetamine      1000 Barbiturate      200 Benzodiazepine   161 Tricyclics        096 Opiates          300 Cocaine          300 THC              50    Alcohol Level No results found for: [place]  IMAGING  Ct Angio Head W Or Wo Contrast Ct Angio Neck W Or Wo Contrast 09/15/2017 IMPRESSION:  1. No emergent large vessel occlusion or hemodynamically significant stenosis of the arteries of the head and neck.  2. Minimal bilateral carotid bifurcation atherosclerotic calcification without stenosis.   Ct Head Wo Contrast 09/15/2017 IMPRESSION:  No acute intracranial abnormality. Mild ethmoid sinusitis.   Mr Brain Wo Contrast 09/15/2017 IMPRESSION:  Multiple small acute infarcts involving the right MCA territory, left PCA territory, and left PICA territory. Findings suggest multiple emboli. Chronic microhemorrhage right posterior temporal lobe and right thalamus possibly due to hypertension.   Transthoracic Echocardiogram - Left ventricle: The cavity size was normal. There was moderate concentric hypertrophy. Systolic function  was normal. The estimated ejection fraction was in the range of 60% to 65%. [place] motion was normal; there were no regional [place] motion abnormalities. Doppler parameters are consistent with abnormal left ventricular relaxation (grade 1 diastolic dysfunction). Doppler parameters are consistent with elevated ventricular end-diastolic filling pressure. - Aortic valve: There was moderate regurgitation. - Mitral valve: Calcified annulus. Mildly thickened leaflets . There was mild regurgitation. - Left atrium: The atrium was mildly dilated. - Right ventricle: Systolic function was normal. - Tricuspid valve: There was mild regurgitation. - Pulmonic valve: There was trivial regurgitation. - Pulmonary arteries: The main pulmonary artery was normal-sized. Systolic pressure was within the normal range. - Inferior vena cava: The vessel was normal in size. - Pericardium, extracardiac: There was no pericardial effusion.  TEE 1. No LAA thrombus 2. Negative for  PFO 3. Mild LAE 4. Trace to mild MR 5. Mild central AI 6. LVEF 60-65%   PHYSICAL EXAM General - Well nourished, well developed, in no apparent distress.  Cardiovascular - Regular rate and rhythm.  Mental Status -  Level of arousal and orientation to time, place, and person were intact. Language including expression, naming, repetition, comprehension was assessed and found intact. Fund of Knowledge was assessed and was intact.  Cranial Nerves II - XII - II - Visual field intact OU. III, IV, VI - Extraocular movements intact V - Facial sensation intact bilaterally. VII - Facial movement intact bilaterally. VIII - Hearing & vestibular intact bilaterally. X - Palate elevates symmetrically. XI - Chin turning & shoulder shrug intact bilaterally. XII - Tongue protrusion intact.  Motor Strength - The patient's strength was normal in all extremities and pronator drift was absent.  Bulk was normal and fasciculations were absent.  decreased FMM L hand with R arm orbiting L  Motor Tone - Muscle tone was assessed at the neck and appendages and was normal.  Sensory - Light touch, temperature/pinprick were assessed and were symmetrical.    Coordination - The patient had normal movements in the hands with no ataxia or dysmetria.  Tremor was absent.  Gait and Station -deferred   ASSESSMENT/PLAN Ms. Mariadejesus Cade is a 57 y.o. L handed female with history of prediabetes, history of palpitations, positive TB test, history of Graves' disease, history of migraines, colon cancer, and current smoker presenting with left-sided weakness, blurred vision, ataxia, and elevated blood pressure. She did not receive IV t-PA due to late presentation.  Stroke: Multiple bilateral infarcts consistent with emboli source unknown  Resultant mild L hand weakness  CT head -no acute abnormality  MRI head - Multiple small acute infarcts involving the right MCA territory, right MCA/ACA, left MCA/PCA, left  cerebellum. Chronic microhemorrhage possibly due to hypertension.   CTA H&N -unremarkable  2D Echo - EF 60-65%  TEE no SOE, no PFO  Pt for ILR, loop recorder placement   LDL 118  HgbA1c - 6.0  UDS pending  Hypercoagulable and autoimmune work up pending (CRP, ESR, HIV neg. Rest of studies pending)  VTE prophylaxis - SCDs Fall precautions Diet NPO time specified Except for: Sips with Meds  No antithrombotic prior to admission, now on aspirin 81 mg daily.  Patient counseled to be compliant with her antithrombotic medications  Ongoing aggressive stroke risk factor management  Therapy recommendations:  Outpt OT  Disposition:  Return home w/ OP OT   Ok for discharge once loop completed.  Hypertension  Still high  Gradual BP normalization  Now on Coreg 12.5 bid, hydralazine 25 q  8, and spironolactone 25 daily Long-term BP goal normotensive  Hyperlipidemia  Lipid lowering medication PTA:  none  LDL 118, goal < 70  Now on Lipitor 40 mg daily  Continue statin at discharge  Tobacco abuse  Current smoker  Smoking cessation counseling provided  Nicotine patch provided  Pt is willing to quit  Other Stroke Risk Factors  Obesity, Body mass index is 31.51 kg/m., recommend weight loss, diet and exercise as appropriate. Agrees to try to decrease FF intake.  Family hx stroke (father)  Migraines  Other Active Problems  Mild hypokalemia 3.3 - supplemented -> 3.4  Graves' disease status post radiation -now on Synthroid, TSH normal  Hospital day # [place] for Pager information 09/18/2017 11:38 AM   ATTENDING NOTE: I reviewed above note and agree with the assessment and plan. I have made any additions or clarifications directly to the above note. Pt was seen and examined.   TEE done and showed unremarkable. No thrombus or PFO. LE venous doppler cancelled. Loop recorder pending. Pt BP still high, added hydralazine  today. Her BP goal normotensive now as she has been several days post stroke. Continue ASA and lipitor. Stroke risk factor modification. outpt OT.   Neurology will sign off. Please call with questions. Pt will follow up with stroke clinic at [facility] in about 4 weeks. Thanks for the consult.  Rosalin Hawking, MD PhD Stroke Neurology 09/18/2017 2:53 PM   To contact Stroke Continuity provider, please refer to [email or web address] After hours, contact General Neurology

## 2017-09-18 NOTE — Discharge Instructions (Signed)
Post implant care instructions °Keep incision clean and dry for 3 days. °You can remove outer dressing tomorrow. °Leave steri-strips (little pieces of tape) on until seen in the office for wound check appointment. °Call the office ([phone]) for redness, drainage, swelling, or fever. ° °

## 2017-09-18 NOTE — Consult Note (Addendum)
ELECTROPHYSIOLOGY CONSULT NOTE  Patient ID: Teresa [name] MRN: [id number], DOB/AGE: 56/13/1962   Admit date: 09/15/2017 Date of Consult: 09/18/2017  Primary Physician: Kinnie Feil, MD Primary Cardiologist: Dr. Oval Linsey Reason for Consultation: Cryptogenic stroke ; recommendations regarding Implantable Loop Recorder, requested by Dr. Erlinda Hong  History of Present Illness Teresa [name] was admitted on 09/15/2017 with Hand weakness, lack of coordination. She developed symptoms while at home, more noted at work.  PMHx includes HTN, hyperthyroism, she last saw Dr. Oval Linsey in 2017, at that visit was doing well, reported hx of CP, palpitations w/u then with normal LVEF, no ischemia by myoview.  Imaging demonstrated Multiple bilateral infarcts consistent with emboli source unknown.  she has undergone workup for stroke including echocardiogram and carotid dopplers.  The patient has been monitored on telemetry which has demonstrated sinus rhythm with no arrhythmias.  Inpatient stroke work-up was completed with a TEE.   Today TEE IMPRESSION:  1. No LAA thrombus 2. Negative for PFO 3. Mild LAE 4. Trace to mild MR 5. Mild central AI 6. LVEF 60-65%    Echocardiogram this admission demonstrated  Study Conclusions  - Left ventricle: The cavity size was normal. There was moderate   concentric hypertrophy. Systolic function was normal. The   estimated ejection fraction was in the range of 60% to 65%. Vogl   motion was normal; there were no regional Cadle motion   abnormalities. Doppler parameters are consistent with abnormal   left ventricular relaxation (grade 1 diastolic dysfunction).   Doppler parameters are consistent with elevated ventricular   end-diastolic filling pressure. - Aortic valve: There was moderate regurgitation. - Mitral valve: Calcified annulus. Mildly thickened leaflets .   There was mild regurgitation. - Left atrium: The atrium was mildly dilated. - Right ventricle:  Systolic function was normal. - Tricuspid valve: There was mild regurgitation. - Pulmonic valve: There was trivial regurgitation. - Pulmonary arteries: The main pulmonary artery was normal-sized.   Systolic pressure was within the normal range. - Inferior vena cava: The vessel was normal in size. - Pericardium, extracardiac: There was no pericardial effusion.   Lab work is reviewed.  Prior to admission, the patient denies chest pain, shortness of breath, dizziness, palpitations, or syncope.  She is recovering from her stroke with plans to home at discharge.   Past Medical History:  Diagnosis Date  . Allergy   . Anemia   . Cervical disc disorder with radiculopathy of cervical region 02/10/2016  . Chronic lower back pain   . Diverticulosis   . Gastritis   . GERD (gastroesophageal reflux disease)   . H/O hiatal hernia   . Hiatal hernia   . History of amputation of lesser toe of left foot ([place]) 03/11/2015  . History of colon cancer   . Hypertension   . Hyperthyroidism    hx  . Hypothyroidism   . Migraine headache    "a few times/yr" (03/05/2015)  . Neck strain 02/06/2012  . Osteomyelitis ([place])    [name] 03/05/2015  . Osteomyelitis of toe of left foot ([place]) 03/05/2015  . Palpitations 01/05/2016  . Positive TB test   . Tubular adenoma of colon   . Tubular adenoma of colon      Surgical History:  Past Surgical History:  Procedure Laterality Date  . ABDOMINAL HYSTERECTOMY  2006  . AMPUTATION TOE Left 03/05/2015   Procedure: AMPUTATION LEFT 5TH  TOE;  Surgeon: Wylene Simmer, MD;  Location: [facility];  Service: Orthopedics;  Laterality: Left;  .  APPENDECTOMY  2006  . BREAST BIOPSY Left 11/26/2014  . [place]  . COLONOSCOPY    . TOE AMPUTATION Left 03/05/2015   5th toe  . TUBAL LIGATION Bilateral 1990  . UPPER GASTROINTESTINAL ENDOSCOPY       Medications Prior to Admission  Medication Sig Dispense Refill Last Dose  . carvedilol (COREG) 6.25 MG tablet Take 6.25  mg by mouth 2 (two) times daily with a meal.   09/15/2017 at 845  . Cholecalciferol (VITAMIN D PO) Take 1 tablet by mouth daily.   09/14/2017 at am  . cyanocobalamin 500 MCG tablet Take 500 mcg by mouth daily.   09/15/2017 at am  . cyclobenzaprine (FLEXERIL) 10 MG tablet Take 1 tablet (10 mg total) by mouth 3 (three) times daily as needed for muscle spasms. 15 tablet 0 couple weeks ago  . cycloSPORINE (RESTASIS) 0.05 % ophthalmic emulsion Place 1 drop into both eyes 2 (two) times daily.    09/15/2017 at am  . levothyroxine (SYNTHROID, LEVOTHROID) 112 MCG tablet TAKE ONE TABLET BY MOUTH ONCE DAILY BEFORE BREAKFAST (Patient taking differently: Take 112 mcg by mouth daily before breakfast. ) 90 tablet 1 09/15/2017 at am  . mometasone (NASONEX) 50 MCG/ACT nasal spray Place 2 sprays into the nose daily. 17 g 12 09/15/2017 at am  . oxymetazoline (AFRIN) 0.05 % nasal spray Place 1 spray into both nostrils See admin instructions. Instill one spray into each nostrils every morning a couple hours after Nasonex dose and then again at bedtime   09/14/2017 at pm  . Polyethylene Glycol 400 (BLINK TEARS OP) Place 1 drop into both eyes 4 (four) times daily as needed (dry eyes).   09/14/2017 at Unknown time  . pantoprazole (PROTONIX) 40 MG tablet Take 1 tablet (40 mg total) by mouth 2 (two) times daily. (Patient not taking: Reported on 06/28/2017) 60 tablet 2 Not Taking at Unknown time  . spironolactone (ALDACTONE) 25 MG tablet TAKE 1 TABLET BY MOUTH ONCE DAILY (Patient taking differently: Take 25 mg by mouth daily. ) 60 tablet 0     Inpatient Medications:  . [MAR Hold] aspirin  81 mg Oral Daily  . [MAR Hold] atorvastatin  40 mg Oral q1800  . [MAR Hold] carvedilol  12.5 mg Oral BID WC  . [MAR Hold] cycloSPORINE  1 drop Both Eyes BID  . [MAR Hold] hydrALAZINE  25 mg Oral Q8H  . [MAR Hold] levothyroxine  112 mcg Oral QAC breakfast  . [MAR Hold] nicotine  14 mg Transdermal Daily  . [MAR Hold] pantoprazole  40 mg Oral Daily    . [MAR Hold] polyethylene glycol  17 g Oral Daily  . [MAR Hold] simethicone  80 mg Oral TID  . [MAR Hold] spironolactone  25 mg Oral Daily  . [MAR Hold] cyanocobalamin  500 mcg Oral Daily    Allergies:  Allergies  Allergen Reactions  . Bactrim [Sulfamethoxazole-Trimethoprim] Anaphylaxis  . Clonidine Derivatives Swelling and Other (See Comments)    Facial swelling  . Amlodipine Swelling    Leg swelling  . Lisinopril Swelling    Lip swelling  . Aspirin Nausea Only  . Latex Rash    Social History   Socioeconomic History  . Marital status: Single    Spouse name: Not on file  . Number of children: 3  . Years of education: Not on file  . Highest education level: Not on file  Occupational History  . Occupation: Radiographer, therapeutic  Social Needs  .  Financial resource strain: Not on file  . Food insecurity:    Worry: Not on file    Inability: Not on file  . Transportation needs:    Medical: Not on file    Non-medical: Not on file  Tobacco Use  . Smoking status: Current Every Day Smoker    Packs/day: 0.50    Years: 36.00    Pack years: 18.00    Types: Cigarettes  . Smokeless tobacco: Never Used  . Tobacco comment: Tobacco info given 03/01/17  Substance and Sexual Activity  . Alcohol use: No  . Drug use: No  . Sexual activity: Not on file  Lifestyle  . Physical activity:    Days per week: Not on file    Minutes per session: Not on file  . Stress: Not on file  Relationships  . Social connections:    Talks on phone: Not on file    Gets together: Not on file    Attends religious service: Not on file    Active member of club or organization: Not on file    Attends meetings of clubs or organizations: Not on file    Relationship status: Not on file  . Intimate partner violence:    Fear of current or ex partner: Not on file    Emotionally abused: Not on file    Physically abused: Not on file    Forced sexual activity: Not on file  Other Topics Concern  . Not on file   Social History Narrative   Lives with two adult children.  Another daughter lives in [place].  Cosmetologist.  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  Likes to fish and play bingo.  Goes to church.     Family History  Problem Relation Age of Onset  . Heart disease Mother        CAB age 57  . Diabetes Mother   . Cancer Father 53       pancreatic cancer  . Stroke Father   . Diabetes Sister   . Kidney disease Sister        renal failure  . Diabetes Brother   . Kidney disease Brother        renal failure  . Heart disease Other   . Diabetes Other   . Breast cancer Maternal Aunt        per pt aunts and uncles died of cancer not sure the type  . Breast cancer Paternal Aunt   . Esophageal cancer Neg Hx   . Rectal cancer Neg Hx   . Stomach cancer Neg Hx       Review of Systems: All other systems reviewed and are otherwise negative except as noted above.  Physical Exam: Vitals:   09/18/17 0130 09/18/17 0402 09/18/17 0822 09/18/17 1135  BP: (!) 154/76 (!) 195/85 (!) 196/88 (!) 190/81  Pulse:  60 63 66  Resp:  17 16 17   Temp:  98.4 F (36.9 C) 98 F (36.7 C) 98 F (36.7 C)  TempSrc:  Oral Oral Oral  SpO2:  100% 93% 97%  Weight:    189 lb (85.7 kg)  Height:    5\' 5"  (1.651 m)    GEN- The patient is well appearing, alert and oriented x 3 today.   Head- normocephalic, atraumatic Eyes-  Sclera clear, conjunctiva pink Ears- hearing intact Oropharynx- clear Neck- supple Lungs- CTA b/l, normal work of breathing Heart- RRR, no murmurs, rubs or gallops  GI- soft, NT, ND Extremities- no clubbing, cyanosis,  or edema MS- no significant deformity or atrophy Skin- no rash or lesion Psych- euthymic mood, full affect   Labs:   Lab Results  Component Value Date   WBC 6.9 09/18/2017   HGB 13.8 09/18/2017   HCT 42.0 09/18/2017   MCV 81.4 09/18/2017   PLT 248 09/18/2017    Recent Labs  Lab 09/15/17 1215  09/18/17 0412  NA 138   < > 138  K 3.6   < > 3.4*  CL 104   < > 103  CO2 24    < > 24  BUN 12   < > 10  CREATININE 1.29*   < > 0.87  CALCIUM 9.7   < > 9.6  PROT 7.2  --   --   BILITOT 0.6  --   --   ALKPHOS 87  --   --   ALT 21  --   --   AST 21  --   --   GLUCOSE 113*   < > 93   < > = values in this interval not displayed.   Lab Results  Component Value Date   CKTOTAL 108 10/10/2010   CKMB 0.9 10/10/2010   TROPONINI 0.02        NO INDICATION OF MYOCARDIAL INJURY. 10/10/2010   Lab Results  Component Value Date   CHOL 171 09/15/2017   CHOL 159 10/27/2015   CHOL 143 02/11/2014   Lab Results  Component Value Date   HDL 40 (L) 09/15/2017   HDL 47 10/27/2015   HDL 43 02/11/2014   Lab Results  Component Value Date   LDLCALC 118 (H) 09/15/2017   LDLCALC 105 10/27/2015   LDLCALC 90 02/11/2014   Lab Results  Component Value Date   TRIG 63 09/15/2017   TRIG 37 10/27/2015   TRIG 48 02/11/2014   Lab Results  Component Value Date   CHOLHDL 4.3 09/15/2017   CHOLHDL 3.4 10/27/2015   CHOLHDL 3.3 02/11/2014   No results found for: LDLDIRECT  Lab Results  Component Value Date   DDIMER 0.54 (H) 09/15/2017     Radiology/Studies:   Ct Angio Head W Or Wo Contrast Result Date: 09/15/2017 CLINICAL DATA:  Headache, left arm pain and weakness EXAM: CT ANGIOGRAPHY HEAD AND NECK TECHNIQUE: Multidetector CT imaging of the head and neck was performed using the standard protocol during bolus administration of intravenous contrast. Multiplanar CT image reconstructions and MIPs were obtained to evaluate the vascular anatomy. Carotid stenosis measurements (when applicable) are obtained utilizing NASCET criteria, using the distal internal carotid diameter as the denominator. CONTRAST:  54mL ISOVUE-370 IOPAMIDOL (ISOVUE-370) INJECTION 76% COMPARISON:  Head CT 09/15/2017 Brain MRI 09/15/2017 FINDINGS: CTA NECK FINDINGS Aortic arch: There is no calcific atherosclerosis of the aortic arch. There is no aneurysm, dissection or hemodynamically significant stenosis of the  visualized ascending aorta and aortic arch. Conventional 3 vessel aortic branching pattern. The visualized proximal subclavian arteries are widely patent. Right carotid system: --Common carotid artery: Widely patent origin without common carotid artery dissection or aneurysm. Mild atherosclerotic calcification at the carotid bifurcation without hemodynamically significant stenosis. --Internal carotid artery: No dissection, occlusion or aneurysm. No hemodynamically significant stenosis. --External carotid artery: No acute abnormality. Left carotid system: --Common carotid artery: Widely patent origin without common carotid artery dissection or aneurysm. Mild atherosclerotic calcification at the carotid bifurcation without hemodynamically significant stenosis. --Internal carotid artery:No dissection, occlusion or aneurysm. No hemodynamically significant stenosis. --External carotid artery: No acute abnormality. Vertebral arteries: Left dominant  configuration. Both origins are normal. No dissection, occlusion or flow-limiting stenosis to the vertebrobasilar confluence. Skeleton: There is no bony spinal canal stenosis. No lytic or blastic lesion. Other neck: Normal pharynx, larynx and major salivary glands. No cervical lymphadenopathy. Unremarkable thyroid gland. Upper chest: No pneumothorax or pleural effusion. No nodules or masses. CTA HEAD FINDINGS Anterior circulation: --Intracranial internal carotid arteries: Normal. --Anterior cerebral arteries: Normal. Both A1 segments are present. --Middle cerebral arteries: Normal. --Posterior communicating arteries: Present on the right, absent on the left. Posterior circulation: --Basilar artery: Normal. --Posterior cerebral arteries: Normal. --Superior cerebellar arteries: Normal. --Inferior cerebellar arteries: Normal anterior and posterior inferior cerebellar arteries. Venous sinuses: As permitted by contrast timing, patent. Anatomic variants: Fetal origin of the right  posterior cerebral artery. Delayed phase: No parenchymal contrast enhancement. Review of the MIP images confirms the above findings. IMPRESSION: 1. No emergent large vessel occlusion or hemodynamically significant stenosis of the arteries of the head and neck. 2. Minimal bilateral carotid bifurcation atherosclerotic calcification without stenosis. Electronically Signed   By: Ulyses Jarred M.D.   On: 09/15/2017 19:48   Dg Chest 2 View Result Date: 09/15/2017 CLINICAL DATA:  Chest pain EXAM: CHEST - 2 VIEW COMPARISON:  05/22/2014 FINDINGS: The heart size and mediastinal contours are within normal limits. Both lungs are clear. The visualized skeletal structures are unremarkable. IMPRESSION: No active cardiopulmonary disease. Electronically Signed   By: Donavan Foil M.D.   On: 09/15/2017 14:47   Ct Head Wo Contrast Result Date: 09/15/2017 CLINICAL DATA:  Headache and left arm pain for 24 hours. EXAM: CT HEAD WITHOUT CONTRAST TECHNIQUE: Contiguous axial images were obtained from the base of the skull through the vertex without intravenous contrast. COMPARISON:  03/23/2012 FINDINGS: Brain: No evidence of acute infarction, hemorrhage, hydrocephalus, extra-axial collection or mass lesion/mass effect. Vascular: No hyperdense vessel or unexpected calcification. Skull: Normal. Negative for fracture or focal lesion. Sinuses/Orbits: Mild ethmoid sinusitis. The remainder of the paranasal sinuses and mastoid air cells are normal. Other: None. IMPRESSION: No acute intracranial abnormality. Mild ethmoid sinusitis. Electronically Signed   By: Fidela Salisbury M.D.   On: 09/15/2017 12:53    Mr Brain Wo Contrast Result Date: 09/15/2017 CLINICAL DATA:  Stroke.  Left arm weakness.  Headache, hypertension EXAM: MRI HEAD WITHOUT CONTRAST TECHNIQUE: Multiplanar, multiecho pulse sequences of the brain and surrounding structures were obtained without intravenous contrast. COMPARISON:  CT head 09/15/2017 FINDINGS: Brain: Multiple  small areas of acute infarct in the right posterior frontal lobe and parietal lobe. Small acute infarcts in the left occipital pole and left lateral inferior cerebellum. Ventricle size is normal. Mild chronic white matter changes. Chronic microhemorrhage right posterior temporal lobe, and right thalamus. Negative for mass or edema or midline shift. Vascular: Normal arterial flow void Skull and upper cervical spine: Negative Sinuses/Orbits: Mild mucosal edema paranasal sinuses.  Normal orbit. Other: None IMPRESSION: Multiple small acute infarcts involving the right MCA territory, left PCA territory, and left PICA territory. Findings suggest multiple emboli. Chronic microhemorrhage right posterior temporal lobe and right thalamus possibly due to hypertension. Electronically Signed   By: Franchot Gallo M.D.   On: 09/15/2017 16:21    12-lead ECG SR All prior EKG's in EPIC reviewed with no documented atrial fibrillation  Telemetry SR  Assessment and Plan:  1. Cryptogenic stroke The patient presents with cryptogenic stroke.    I spoke at length with the patient about monitoring for afib with either a 30 day event monitor or an implantable loop recorder.  Risks, benefits, and alteratives to implantable loop recorder were discussed with the patient today.   At this time, the patient is very clear in herdecision to proceed with implantable loop recorder.   She did have sedation earlier today, she is able to repeat back to me the rational for heart rhythm monitoring, mentions that 2 doctors have discussed this with her already and she would like to proceed.  Wound care was reviewed with the patient (keep incision clean and dry for 3 days).  Wound check will be scheduled for the patient  Please call with questions.   Renee Dyane Dustman, PA-C 09/18/2017  EP Attending   Patient seen and examined. Agree with above. The patient is a 57 yo woman with bilateral embolic phenomena and is referred to consider  insertion of an ILR. I have discussed the indications for insertion of an ILR as she is at risk for developing atrial fib. She wishes to proceed.  Cristopher Peru, M.D.

## 2017-09-18 NOTE — H&P (Signed)
   INTERVAL PROCEDURE H&P  History and Physical Interval Note:  09/18/2017 12:00 PM  Teresa [name] has presented today for their planned procedure. The various methods of treatment have been discussed with the patient and family. After consideration of risks, benefits and other options for treatment, the patient has consented to the procedure.  The patients' outpatient history has been reviewed, patient examined, and no change in status from most recent office note within the past 30 days. I have reviewed the patients' chart and labs and will proceed as planned. Questions were answered to the patient's satisfaction.   Pixie Casino, MD, [facility], [place] Director of the Advanced Lipid Disorders &  Cardiovascular Risk Reduction Clinic Diplomate of the American Board of Clinical Lipidology Attending Cardiologist  Direct Dial: [phone]  Fax: [phone]  Website:  [name] 09/18/2017, 12:00 PM

## 2017-09-18 NOTE — CV Procedure (Signed)
TRANSESOPHAGEAL ECHOCARDIOGRAM (TEE) NOTE  INDICATIONS: cryptogenic stroke  PROCEDURE:   Informed consent was obtained prior to the procedure. The risks, benefits and alternatives for the procedure were discussed and the patient comprehended these risks.  Risks include, but are not limited to, cough, sore throat, vomiting, nausea, somnolence, esophageal and stomach trauma or perforation, bleeding, low blood pressure, aspiration, pneumonia, infection, trauma to the teeth and death.    After a procedural time-out, the patient was given 5 mg versed and 75 mcg fentanyl for moderate sedation.  The patient's heart rate, blood pressure, and oxygen saturation are monitored continuously during the procedure.The oropharynx was anesthetized with 2 cetacaine sprays.  The transesophageal probe was inserted in the esophagus and stomach without difficulty and multiple views were obtained.  The patient was kept under observation until the patient left the procedure room.  The period of conscious sedation is 12 minutes, of which I was present face-to-face 100% of this time. The patient left the procedure room in stable condition.   Agitated microbubble saline contrast was administered.  COMPLICATIONS:    There were no immediate complications.  Findings:  1. LEFT VENTRICLE: The left ventricular Luthi thickness is moderately increased.  The left ventricular cavity is normal in size. Belcher motion is normal.  LVEF is 60-65%.  2. RIGHT VENTRICLE:  The right ventricle is normal in structure and function without any thrombus or masses.    3. LEFT ATRIUM:  The left atrium is mildly dilated in size without any thrombus or masses.  There is not spontaneous echo contrast ("smoke") in the left atrium consistent with a low flow state.  4. LEFT ATRIAL APPENDAGE:  The left atrial appendage is free of any thrombus or masses. The appendage has single lobes. Pulse doppler indicates high flow in the appendage.  5. ATRIAL  SEPTUM:  The atrial septum appears intact and is free of thrombus and/or masses.  There is no evidence for interatrial shunting by color doppler and saline microbubble.  6. RIGHT ATRIUM:  The right atrium is normal in size and function without any thrombus or masses.  7. MITRAL VALVE:  The mitral valve is normal in structure and function with trace to mild regurgitation.  There were no vegetations or stenosis.  8. AORTIC VALVE:  The aortic valve is trileaflet, normal in structure and function with Mild central regurgitation.  There were no vegetations or stenosis  9. TRICUSPID VALVE:  The tricuspid valve is normal in structure and function with trace to mild regurgitation.  There were no vegetations or stenosis  10.  PULMONIC VALVE:  The pulmonic valve is normal in structure and function with trivial regurgitation.  There were no vegetations or stenosis.   11. AORTIC ARCH, ASCENDING AND DESCENDING AORTA:  There was no Ron Parker et. Al, 1992) atherosclerosis of the ascending aorta, aortic arch, or proximal descending aorta.  12. PULMONARY VEINS: Anomalous pulmonary venous return was not noted.  13. PERICARDIUM: The pericardium appeared normal and non-thickened.  There is no pericardial effusion.  IMPRESSION:   1. No LAA thrombus 2. Negative for PFO 3. Mild LAE 4. Trace to mild MR 5. Mild central AI 6. LVEF 60-65%  RECOMMENDATIONS:    1.  No cardiac source of emboli. Consider proceeding with ILR placement.  Time Spent Directly with the Patient:  45 minutes   Pixie Casino, MD, [facility], [place] Director of the Advanced Lipid Disorders &  Cardiovascular Risk Reduction Clinic  Diplomate of the [employer] of Clinical Lipidology Attending Cardiologist  Direct Dial: [phone]  Fax: [phone]  Website:  [name] 09/18/2017, 12:23 PM

## 2017-09-18 NOTE — H&P (View-Only) (Signed)
ELECTROPHYSIOLOGY CONSULT NOTE  Patient ID: Teresa [name] MRN: [id number], DOB/AGE: 56-11-1960   Admit date: 09/15/2017 Date of Consult: 09/18/2017  Primary Physician: Teresa Feil, MD Primary Cardiologist: Dr. Oval [name] Reason for Consultation: Cryptogenic stroke ; recommendations regarding Implantable Loop Recorder, requested by Dr. Erlinda [name]  History of Present Illness Teresa [name] was admitted on 09/15/2017 with Hand weakness, lack of coordination. She developed symptoms while at home, more noted at work.  PMHx includes HTN, hyperthyroism, she last saw Dr. Oval [name] in 2017, at that visit was doing well, reported hx of CP, palpitations w/u then with normal LVEF, no ischemia by myoview.  Imaging demonstrated Multiple bilateral infarcts consistent with emboli source unknown.  she has undergone workup for stroke including echocardiogram and carotid dopplers.  The patient has been monitored on telemetry which has demonstrated sinus rhythm with no arrhythmias.  Inpatient stroke work-up was completed with a TEE.   Today TEE IMPRESSION:  1. No LAA thrombus 2. Negative for PFO 3. Mild LAE 4. Trace to mild MR 5. Mild central AI 6. LVEF 60-65%    Echocardiogram this admission demonstrated  Study Conclusions  - Left ventricle: The cavity size was normal. There was moderate   concentric hypertrophy. Systolic function was normal. The   estimated ejection fraction was in the range of 60% to 65%. Bennett   motion was normal; there were no regional Vitullo motion   abnormalities. Doppler parameters are consistent with abnormal   left ventricular relaxation (grade 1 diastolic dysfunction).   Doppler parameters are consistent with elevated ventricular   end-diastolic filling pressure. - Aortic valve: There was moderate regurgitation. - Mitral valve: Calcified annulus. Mildly thickened leaflets .   There was mild regurgitation. - Left atrium: The atrium was mildly dilated. - Right ventricle:  Systolic function was normal. - Tricuspid valve: There was mild regurgitation. - Pulmonic valve: There was trivial regurgitation. - Pulmonary arteries: The main pulmonary artery was normal-sized.   Systolic pressure was within the normal range. - Inferior vena cava: The vessel was normal in size. - Pericardium, extracardiac: There was no pericardial effusion.   Lab work is reviewed.  Prior to admission, the patient denies chest pain, shortness of breath, dizziness, palpitations, or syncope.  She is recovering from her stroke with plans to home at discharge.   Past Medical History:  Diagnosis Date  . Allergy   . Anemia   . Cervical disc disorder with radiculopathy of cervical region 02/10/2016  . Chronic lower back pain   . Diverticulosis   . Gastritis   . GERD (gastroesophageal reflux disease)   . H/O hiatal hernia   . Hiatal hernia   . History of amputation of lesser toe of left foot ([place]) 03/11/2015  . History of colon cancer   . Hypertension   . Hyperthyroidism    hx  . Hypothyroidism   . Migraine headache    "a few times/yr" (03/05/2015)  . Neck strain 02/06/2012  . Osteomyelitis ([place])    [name] 03/05/2015  . Osteomyelitis of toe of left foot ([place]) 03/05/2015  . Palpitations 01/05/2016  . Positive TB test   . Tubular adenoma of colon   . Tubular adenoma of colon      Surgical History:  Past Surgical History:  Procedure Laterality Date  . ABDOMINAL HYSTERECTOMY  2006  . AMPUTATION TOE Left 03/05/2015   Procedure: AMPUTATION LEFT 5TH  TOE;  Surgeon: Wylene Simmer, MD;  Location: [facility];  Service: Orthopedics;  Laterality: Left;  .  APPENDECTOMY  2006  . BREAST BIOPSY Left 11/26/2014  . [place]  . COLONOSCOPY    . TOE AMPUTATION Left 03/05/2015   5th toe  . TUBAL LIGATION Bilateral 1990  . UPPER GASTROINTESTINAL ENDOSCOPY       Medications Prior to Admission  Medication Sig Dispense Refill Last Dose  . carvedilol (COREG) 6.25 MG tablet Take 6.25  mg by mouth 2 (two) times daily with a meal.   09/15/2017 at 845  . Cholecalciferol (VITAMIN D PO) Take 1 tablet by mouth daily.   09/14/2017 at am  . cyanocobalamin 500 MCG tablet Take 500 mcg by mouth daily.   09/15/2017 at am  . cyclobenzaprine (FLEXERIL) 10 MG tablet Take 1 tablet (10 mg total) by mouth 3 (three) times daily as needed for muscle spasms. 15 tablet 0 couple weeks ago  . cycloSPORINE (RESTASIS) 0.05 % ophthalmic emulsion Place 1 drop into both eyes 2 (two) times daily.    09/15/2017 at am  . levothyroxine (SYNTHROID, LEVOTHROID) 112 MCG tablet TAKE ONE TABLET BY MOUTH ONCE DAILY BEFORE BREAKFAST (Patient taking differently: Take 112 mcg by mouth daily before breakfast. ) 90 tablet 1 09/15/2017 at am  . mometasone (NASONEX) 50 MCG/ACT nasal spray Place 2 sprays into the nose daily. 17 g 12 09/15/2017 at am  . oxymetazoline (AFRIN) 0.05 % nasal spray Place 1 spray into both nostrils See admin instructions. Instill one spray into each nostrils every morning a couple hours after Nasonex dose and then again at bedtime   09/14/2017 at pm  . Polyethylene Glycol 400 (BLINK TEARS OP) Place 1 drop into both eyes 4 (four) times daily as needed (dry eyes).   09/14/2017 at Unknown time  . pantoprazole (PROTONIX) 40 MG tablet Take 1 tablet (40 mg total) by mouth 2 (two) times daily. (Patient not taking: Reported on 06/28/2017) 60 tablet 2 Not Taking at Unknown time  . spironolactone (ALDACTONE) 25 MG tablet TAKE 1 TABLET BY MOUTH ONCE DAILY (Patient taking differently: Take 25 mg by mouth daily. ) 60 tablet 0     Inpatient Medications:  . [MAR Hold] aspirin  81 mg Oral Daily  . [MAR Hold] atorvastatin  40 mg Oral q1800  . [MAR Hold] carvedilol  12.5 mg Oral BID WC  . [MAR Hold] cycloSPORINE  1 drop Both Eyes BID  . [MAR Hold] hydrALAZINE  25 mg Oral Q8H  . [MAR Hold] levothyroxine  112 mcg Oral QAC breakfast  . [MAR Hold] nicotine  14 mg Transdermal Daily  . [MAR Hold] pantoprazole  40 mg Oral Daily    . [MAR Hold] polyethylene glycol  17 g Oral Daily  . [MAR Hold] simethicone  80 mg Oral TID  . [MAR Hold] spironolactone  25 mg Oral Daily  . [MAR Hold] cyanocobalamin  500 mcg Oral Daily    Allergies:  Allergies  Allergen Reactions  . Bactrim [Sulfamethoxazole-Trimethoprim] Anaphylaxis  . Clonidine Derivatives Swelling and Other (See Comments)    Facial swelling  . Amlodipine Swelling    Leg swelling  . Lisinopril Swelling    Lip swelling  . Aspirin Nausea Only  . Latex Rash    Social History   Socioeconomic History  . Marital status: Single    Spouse name: Not on file  . Number of children: 3  . Years of education: Not on file  . Highest education level: Not on file  Occupational History  . Occupation: Radiographer, therapeutic  Social Needs  .  Financial resource strain: Not on file  . Food insecurity:    Worry: Not on file    Inability: Not on file  . Transportation needs:    Medical: Not on file    Non-medical: Not on file  Tobacco Use  . Smoking status: Current Every Day Smoker    Packs/day: 0.50    Years: 36.00    Pack years: 18.00    Types: Cigarettes  . Smokeless tobacco: Never Used  . Tobacco comment: Tobacco info given 03/01/17  Substance and Sexual Activity  . Alcohol use: No  . Drug use: No  . Sexual activity: Not on file  Lifestyle  . Physical activity:    Days per week: Not on file    Minutes per session: Not on file  . Stress: Not on file  Relationships  . Social connections:    Talks on phone: Not on file    Gets together: Not on file    Attends religious service: Not on file    Active member of club or organization: Not on file    Attends meetings of clubs or organizations: Not on file    Relationship status: Not on file  . Intimate partner violence:    Fear of current or ex partner: Not on file    Emotionally abused: Not on file    Physically abused: Not on file    Forced sexual activity: Not on file  Other Topics Concern  . Not on file   Social History Narrative   Lives with two adult children.  Another daughter lives in [place].  Cosmetologist.  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  Likes to fish and play bingo.  Goes to church.     Family History  Problem Relation Age of Onset  . Heart disease Mother        CAB age 65  . Diabetes Mother   . Cancer Father 70       pancreatic cancer  . Stroke Father   . Diabetes Sister   . Kidney disease Sister        renal failure  . Diabetes Brother   . Kidney disease Brother        renal failure  . Heart disease Other   . Diabetes Other   . Breast cancer Maternal Aunt        per pt aunts and uncles died of cancer not sure the type  . Breast cancer Paternal Aunt   . Esophageal cancer Neg Hx   . Rectal cancer Neg Hx   . Stomach cancer Neg Hx       Review of Systems: All other systems reviewed and are otherwise negative except as noted above.  Physical Exam: Vitals:   09/18/17 0130 09/18/17 0402 09/18/17 0822 09/18/17 1135  BP: (!) 154/76 (!) 195/85 (!) 196/88 (!) 190/81  Pulse:  60 63 66  Resp:  17 16 17   Temp:  98.4 F (36.9 C) 98 F (36.7 C) 98 F (36.7 C)  TempSrc:  Oral Oral Oral  SpO2:  100% 93% 97%  Weight:    189 lb (85.7 kg)  Height:    5\' 5"  (1.651 m)    GEN- The patient is well appearing, alert and oriented x 3 today.   Head- normocephalic, atraumatic Eyes-  Sclera clear, conjunctiva pink Ears- hearing intact Oropharynx- clear Neck- supple Lungs- CTA b/l, normal work of breathing Heart- RRR, no murmurs, rubs or gallops  GI- soft, NT, ND Extremities- no clubbing, cyanosis,  or edema MS- no significant deformity or atrophy Skin- no rash or lesion Psych- euthymic mood, full affect   Labs:   Lab Results  Component Value Date   WBC 6.9 09/18/2017   HGB 13.8 09/18/2017   HCT 42.0 09/18/2017   MCV 81.4 09/18/2017   PLT 248 09/18/2017    Recent Labs  Lab 09/15/17 1215  09/18/17 0412  NA 138   < > 138  K 3.6   < > 3.4*  CL 104   < > 103  CO2 24    < > 24  BUN 12   < > 10  CREATININE 1.29*   < > 0.87  CALCIUM 9.7   < > 9.6  PROT 7.2  --   --   BILITOT 0.6  --   --   ALKPHOS 87  --   --   ALT 21  --   --   AST 21  --   --   GLUCOSE 113*   < > 93   < > = values in this interval not displayed.   Lab Results  Component Value Date   CKTOTAL 108 10/10/2010   CKMB 0.9 10/10/2010   TROPONINI 0.02        NO INDICATION OF MYOCARDIAL INJURY. 10/10/2010   Lab Results  Component Value Date   CHOL 171 09/15/2017   CHOL 159 10/27/2015   CHOL 143 02/11/2014   Lab Results  Component Value Date   HDL 40 (L) 09/15/2017   HDL 47 10/27/2015   HDL 43 02/11/2014   Lab Results  Component Value Date   LDLCALC 118 (H) 09/15/2017   LDLCALC 105 10/27/2015   LDLCALC 90 02/11/2014   Lab Results  Component Value Date   TRIG 63 09/15/2017   TRIG 37 10/27/2015   TRIG 48 02/11/2014   Lab Results  Component Value Date   CHOLHDL 4.3 09/15/2017   CHOLHDL 3.4 10/27/2015   CHOLHDL 3.3 02/11/2014   No results found for: LDLDIRECT  Lab Results  Component Value Date   DDIMER 0.54 (H) 09/15/2017     Radiology/Studies:   Ct Angio Head W Or Wo Contrast Result Date: 09/15/2017 CLINICAL DATA:  Headache, left arm pain and weakness EXAM: CT ANGIOGRAPHY HEAD AND NECK TECHNIQUE: Multidetector CT imaging of the head and neck was performed using the standard protocol during bolus administration of intravenous contrast. Multiplanar CT image reconstructions and MIPs were obtained to evaluate the vascular anatomy. Carotid stenosis measurements (when applicable) are obtained utilizing NASCET criteria, using the distal internal carotid diameter as the denominator. CONTRAST:  36mL ISOVUE-370 IOPAMIDOL (ISOVUE-370) INJECTION 76% COMPARISON:  Head CT 09/15/2017 Brain MRI 09/15/2017 FINDINGS: CTA NECK FINDINGS Aortic arch: There is no calcific atherosclerosis of the aortic arch. There is no aneurysm, dissection or hemodynamically significant stenosis of the  visualized ascending aorta and aortic arch. Conventional 3 vessel aortic branching pattern. The visualized proximal subclavian arteries are widely patent. Right carotid system: --Common carotid artery: Widely patent origin without common carotid artery dissection or aneurysm. Mild atherosclerotic calcification at the carotid bifurcation without hemodynamically significant stenosis. --Internal carotid artery: No dissection, occlusion or aneurysm. No hemodynamically significant stenosis. --External carotid artery: No acute abnormality. Left carotid system: --Common carotid artery: Widely patent origin without common carotid artery dissection or aneurysm. Mild atherosclerotic calcification at the carotid bifurcation without hemodynamically significant stenosis. --Internal carotid artery:No dissection, occlusion or aneurysm. No hemodynamically significant stenosis. --External carotid artery: No acute abnormality. Vertebral arteries: Left dominant  configuration. Both origins are normal. No dissection, occlusion or flow-limiting stenosis to the vertebrobasilar confluence. Skeleton: There is no bony spinal canal stenosis. No lytic or blastic lesion. Other neck: Normal pharynx, larynx and major salivary glands. No cervical lymphadenopathy. Unremarkable thyroid gland. Upper chest: No pneumothorax or pleural effusion. No nodules or masses. CTA HEAD FINDINGS Anterior circulation: --Intracranial internal carotid arteries: Normal. --Anterior cerebral arteries: Normal. Both A1 segments are present. --Middle cerebral arteries: Normal. --Posterior communicating arteries: Present on the right, absent on the left. Posterior circulation: --Basilar artery: Normal. --Posterior cerebral arteries: Normal. --Superior cerebellar arteries: Normal. --Inferior cerebellar arteries: Normal anterior and posterior inferior cerebellar arteries. Venous sinuses: As permitted by contrast timing, patent. Anatomic variants: Fetal origin of the right  posterior cerebral artery. Delayed phase: No parenchymal contrast enhancement. Review of the MIP images confirms the above findings. IMPRESSION: 1. No emergent large vessel occlusion or hemodynamically significant stenosis of the arteries of the head and neck. 2. Minimal bilateral carotid bifurcation atherosclerotic calcification without stenosis. Electronically Signed   By: Ulyses Jarred M.D.   On: 09/15/2017 19:48   Dg Chest 2 View Result Date: 09/15/2017 CLINICAL DATA:  Chest pain EXAM: CHEST - 2 VIEW COMPARISON:  05/22/2014 FINDINGS: The heart size and mediastinal contours are within normal limits. Both lungs are clear. The visualized skeletal structures are unremarkable. IMPRESSION: No active cardiopulmonary disease. Electronically Signed   By: Donavan Foil M.D.   On: 09/15/2017 14:47   Ct Head Wo Contrast Result Date: 09/15/2017 CLINICAL DATA:  Headache and left arm pain for 24 hours. EXAM: CT HEAD WITHOUT CONTRAST TECHNIQUE: Contiguous axial images were obtained from the base of the skull through the vertex without intravenous contrast. COMPARISON:  03/23/2012 FINDINGS: Brain: No evidence of acute infarction, hemorrhage, hydrocephalus, extra-axial collection or mass lesion/mass effect. Vascular: No hyperdense vessel or unexpected calcification. Skull: Normal. Negative for fracture or focal lesion. Sinuses/Orbits: Mild ethmoid sinusitis. The remainder of the paranasal sinuses and mastoid air cells are normal. Other: None. IMPRESSION: No acute intracranial abnormality. Mild ethmoid sinusitis. Electronically Signed   By: Fidela Salisbury M.D.   On: 09/15/2017 12:53    Mr Brain Wo Contrast Result Date: 09/15/2017 CLINICAL DATA:  Stroke.  Left arm weakness.  Headache, hypertension EXAM: MRI HEAD WITHOUT CONTRAST TECHNIQUE: Multiplanar, multiecho pulse sequences of the brain and surrounding structures were obtained without intravenous contrast. COMPARISON:  CT head 09/15/2017 FINDINGS: Brain: Multiple  small areas of acute infarct in the right posterior frontal lobe and parietal lobe. Small acute infarcts in the left occipital pole and left lateral inferior cerebellum. Ventricle size is normal. Mild chronic white matter changes. Chronic microhemorrhage right posterior temporal lobe, and right thalamus. Negative for mass or edema or midline shift. Vascular: Normal arterial flow void Skull and upper cervical spine: Negative Sinuses/Orbits: Mild mucosal edema paranasal sinuses.  Normal orbit. Other: None IMPRESSION: Multiple small acute infarcts involving the right MCA territory, left PCA territory, and left PICA territory. Findings suggest multiple emboli. Chronic microhemorrhage right posterior temporal lobe and right thalamus possibly due to hypertension. Electronically Signed   By: Franchot Gallo M.D.   On: 09/15/2017 16:21    12-lead ECG SR All prior EKG's in EPIC reviewed with no documented atrial fibrillation  Telemetry SR  Assessment and Plan:  1. Cryptogenic stroke The patient presents with cryptogenic stroke.    I spoke at length with the patient about monitoring for afib with either a 30 day event monitor or an implantable loop recorder.  Risks, benefits, and alteratives to implantable loop recorder were discussed with the patient today.   At this time, the patient is very clear in herdecision to proceed with implantable loop recorder.   She did have sedation earlier today, she is able to repeat back to me the rational for heart rhythm monitoring, mentions that 2 doctors have discussed this with her already and she would like to proceed.  Wound care was reviewed with the patient (keep incision clean and dry for 3 days).  Wound check will be scheduled for the patient  Please call with questions.   Renee Dyane Dustman, PA-C 09/18/2017  EP Attending   Patient seen and examined. Agree with above. The patient is a 57 yo woman with bilateral embolic phenomena and is referred to consider  insertion of an ILR. I have discussed the indications for insertion of an ILR as she is at risk for developing atrial fib. She wishes to proceed.  Cristopher Peru, M.D.

## 2017-09-19 ENCOUNTER — Telehealth: Payer: Self-pay | Admitting: Cardiology

## 2017-09-19 ENCOUNTER — Encounter (HOSPITAL_COMMUNITY): Payer: Self-pay | Admitting: Internal Medicine

## 2017-09-19 LAB — LUPUS ANTICOAGULANT PANEL
DRVVT: 38.5 s (ref 0.0–47.0)
PTT Lupus Anticoagulant: 36.9 s (ref 0.0–51.9)

## 2017-09-19 LAB — SICKLE CELL SCREEN: Sickle Cell Screen: NEGATIVE

## 2017-09-19 LAB — CARDIOLIPIN ANTIBODIES, IGG, IGM, IGA
Anticardiolipin IgA: <9 APL U/mL (ref 0–11)
Anticardiolipin IgG: <9 GPL U/mL (ref 0–14)
Anticardiolipin IgM: 12 MPL U/mL (ref 0–12)

## 2017-09-19 LAB — BETA-2-GLYCOPROTEIN I ABS, IGG/M/A
Beta-2 Glyco I IgG: <9 GPI IgG units (ref 0–20)
Beta-2-Glycoprotein I IgA: <9 GPI IgA units (ref 0–25)
Beta-2-Glycoprotein I IgM: <9 GPI IgM units (ref 0–32)

## 2017-09-19 LAB — HOMOCYSTEINE: Homocysteine: 12.3 umol/L (ref 0.0–15.0)

## 2017-09-19 LAB — ANTINUCLEAR ANTIBODIES, IFA: ANA Ab, IFA: NEGATIVE

## 2017-09-19 LAB — ANTI-DNA ANTIBODY, DOUBLE-STRANDED: ds DNA Ab: 1 IU/mL (ref 0–9)

## 2017-09-19 NOTE — Telephone Encounter (Signed)
Spoke with patient who states that yesterday she noted some blood underneath her steri strips. She states that today it has stopped bleeding but left her steristrips stained, but intact. I encouraged her to keep her wound check appointment. I explained that if it began to bleed again she should hold pressure for ten min. She verbalized understating.

## 2017-09-19 NOTE — Telephone Encounter (Signed)
New Message:    Pt is having bleeding at the site and pt has a couple of questions  1. Has your device fired? No  2. Is you device beeping? No  3. Are you experiencing draining or swelling at device site? draining  4. Are you calling to see if we received your device transmission? no  5. Have you passed out? No    Please route to [place]

## 2017-09-21 ENCOUNTER — Encounter: Payer: Self-pay | Admitting: Internal Medicine

## 2017-09-21 ENCOUNTER — Other Ambulatory Visit: Payer: Self-pay

## 2017-09-21 ENCOUNTER — Ambulatory Visit (INDEPENDENT_AMBULATORY_CARE_PROVIDER_SITE_OTHER): Payer: BLUE CROSS/BLUE SHIELD | Admitting: Internal Medicine

## 2017-09-21 DIAGNOSIS — I10 Essential (primary) hypertension: Secondary | ICD-10-CM

## 2017-09-21 DIAGNOSIS — Z720 Tobacco use: Secondary | ICD-10-CM | POA: Diagnosis not present

## 2017-09-21 DIAGNOSIS — I6340 Cerebral infarction due to embolism of unspecified cerebral artery: Secondary | ICD-10-CM

## 2017-09-21 MED ORDER — SPIRONOLACTONE 25 MG PO TABS: 25.0000 mg | ORAL_TABLET | Freq: Every day | ORAL | 3 refills | Status: DC

## 2017-09-21 NOTE — Progress Notes (Signed)
   Subjective:   Patient: Teresa [name]       Birthdate: 1960/10/22       MRN: [id number]      HPI  Teresa [name] is a 57 y.o. female presenting for hosp f/u.   Patient was admitted from 03/22-03/25 for CVA. Presented initially for L arm weakness and HA, and was noted to have multiple small infarcts suggestive of embolic stroke on ED CT. Was subsequently admitted for stroke work-up. Also noted to have AKI with Cr 1.29 on admission which improved after IVF. Strength in LUE improved during admission. She did have high blood pressure throughout admission, so hydralazine 50mg  TID, spirnolactone 25mg  qd, and carvedilol 12.5mg  qd were started.  Since discharge, patient reports that she is doing well. Denies any weakness in her arm, though says that her coordination is still lightly off. She has been doing the exercises she was shown while admitted to help improve her coordination, which she thinks is helping. Has first appt with OT scheduled for April 17. Has cardiology appt on April. Has not been taking spironolactone because she says she has not been able to get a refill. Has been taking other BP meds as prescribed. She says she bought a BP cuff and is going to start measuring her blood pressure and keeping a log.   Smoking status reviewed. Patient is current every day smoker.   Review of Systems See HPI.     Objective:  Physical Exam  Constitutional: She is oriented to person, place, and time and well-developed, well-nourished, and in no distress.  HENT:  Head: Normocephalic and atraumatic.  Pulmonary/Chest: Effort normal. No respiratory distress.  Musculoskeletal:  5/5 strength upper extremities bilaterally  Neurological: She is alert and oriented to person, place, and time.  Some difficulty with finger to nose on L compared to R though not significant. No difficulty with rapid alternating movements bilaterally.   Skin: Skin is warm and dry.  Psychiatric: Affect and judgment normal.    Assessment & Plan:  Essential hypertension BP above goal in office today at 152/78. Patient has not been taking spironolactone because she has not picked it up from pharmacy. Patient is pleased with her BP today, as she says it is decreased from BP during admission. Discussed that while her BP has improved, it is still above goal, so we need to work to continue to decrease BP. Prescribed additional spironolactone refill per patient's request. She says she will pick it up today. F/u for nurse visit BP rechecked in one week. F/u in one month for HTN f/u.  - Continue hydralazine TID, carvedilol BID, and spironolactone 25mg  qd - Bring BP log with her to next visit  Stroke [facility]) Doing well since discharge. Full strength in both upper extremities, though still slightly less coordinated on L. Has first appt with OT in a couple weeks. Encouraged her to keep this appt and continuing doing at home exercises. Also stressed importance of continuing statin and BP meds.   Tobacco abuse Quit smoking a few weeks ago, a couple days prior to hospital admission. Has been wearing nicotine patch and says she is doing well with this. As she was counseled on tobacco cessation while admitted and given packet of resources, she declines further counseling today. Continue to inquire about cessation and congratulate patient on success at subsequent visits.    Adin Hector, MD, MPH PGY-3 [facility] Pager [phone]

## 2017-09-21 NOTE — Assessment & Plan Note (Signed)
Quit smoking a few weeks ago, a couple days prior to hospital admission. Has been wearing nicotine patch and says she is doing well with this. As she was counseled on tobacco cessation while admitted and given packet of resources, she declines further counseling today. Continue to inquire about cessation and congratulate patient on success at subsequent visits.

## 2017-09-21 NOTE — Assessment & Plan Note (Signed)
BP above goal in office today at 152/78. Patient has not been taking spironolactone because she has not picked it up from pharmacy. Patient is pleased with her BP today, as she says it is decreased from BP during admission. Discussed that while her BP has improved, it is still above goal, so we need to work to continue to decrease BP. Prescribed additional spironolactone refill per patient's request. She says she will pick it up today. F/u for nurse visit BP rechecked in one week. F/u in one month for HTN f/u.  - Continue hydralazine TID, carvedilol BID, and spironolactone 25mg  qd - Bring BP log with her to next visit

## 2017-09-21 NOTE — Assessment & Plan Note (Signed)
Doing well since discharge. Full strength in both upper extremities, though still slightly less coordinated on L. Has first appt with OT in a couple weeks. Encouraged her to keep this appt and continuing doing at home exercises. Also stressed importance of continuing statin and BP meds.

## 2017-09-21 NOTE — Patient Instructions (Signed)
It was nice meeting you today Teresa [name]!  Please continue taking your medications as you have been. I have refilled spironolactone for you today, so begin taking that as planned.   We will see you back for a nurse visit to check your blood pressure in one week. We will see you back for a full follow-up appointment in about a month.   If you find that your blood pressure is persistently high at home, please call to let us know rather than waiting until your next appointment.    If you have any questions or concerns, please feel free to call the clinic.   Be well,  Dr. Avon Gully

## 2017-09-28 ENCOUNTER — Ambulatory Visit: Payer: BLUE CROSS/BLUE SHIELD

## 2017-10-02 ENCOUNTER — Other Ambulatory Visit: Payer: Self-pay | Admitting: Family Medicine

## 2017-10-02 ENCOUNTER — Other Ambulatory Visit: Payer: Self-pay

## 2017-10-02 ENCOUNTER — Ambulatory Visit (INDEPENDENT_AMBULATORY_CARE_PROVIDER_SITE_OTHER): Payer: Self-pay | Admitting: *Deleted

## 2017-10-02 ENCOUNTER — Ambulatory Visit: Payer: BLUE CROSS/BLUE SHIELD

## 2017-10-02 VITALS — BP 132/74 | HR 67

## 2017-10-02 DIAGNOSIS — I639 Cerebral infarction, unspecified: Secondary | ICD-10-CM

## 2017-10-02 DIAGNOSIS — J309 Allergic rhinitis, unspecified: Secondary | ICD-10-CM

## 2017-10-02 DIAGNOSIS — I10 Essential (primary) hypertension: Secondary | ICD-10-CM

## 2017-10-02 DIAGNOSIS — H1010 Acute atopic conjunctivitis, unspecified eye: Secondary | ICD-10-CM

## 2017-10-02 MED ORDER — OLOPATADINE HCL 0.1 % OP SOLN: 1.0000 [drp] | Freq: Two times a day (BID) | OPHTHALMIC | Status: DC

## 2017-10-02 MED ORDER — LEVOTHYROXINE SODIUM 112 MCG PO TABS: ORAL_TABLET | ORAL | 0 refills | Status: DC

## 2017-10-02 NOTE — Progress Notes (Signed)
Patient here today for BP check.     BP today is 132/74.  Checked BP in L arm with regular cuff.  Symptoms present: none.  Patient last took BP med 0830 today.  Routed note to PCP.    Wallace Cullens, RN

## 2017-10-02 NOTE — Telephone Encounter (Signed)
Pt requesting refill of levothyroxine to [facility]. She is also requesting a new Rx of an allergy eye drop. Currently using OTC eye drops for itching and constant redness, but would like a Rx for something better. Wallace Cullens, RN

## 2017-10-02 NOTE — Progress Notes (Signed)
Wound Loop check in clinic. Bandage removed, incision edges approximated, wound well healed.  Battery status: Good. R-waves 0.62 mV. 0 symptom episodes, 0 tachy episodes, 0 pause episodes, 0 brady episodes. 0 AF episodes  Monthly summary reports and ROV with GT PRN

## 2017-10-05 ENCOUNTER — Telehealth: Payer: Self-pay

## 2017-10-05 NOTE — Telephone Encounter (Signed)
Pt called nurse line, states that she is concerned one of her 2 new medications is causing leg cramps. States she thinks it is either lipitor or HCTZ. She has stopped her HCTZ and the cramping went away. Wanting to know if she should restart or change medications. Will forward to MD for review. Wallace Cullens, RN

## 2017-10-06 NOTE — Telephone Encounter (Signed)
As far as I can tell from discharge summary and hospital follow up visit at [facility], Teresa [name] is not suppose to be taking HCTZ.    I recommend she stop the HCTZ.    Recommend a nurse visit to review medications and make sure there has been medication reconciliation.  Ask patient to bring in all her medications to the visit.

## 2017-10-06 NOTE — Telephone Encounter (Signed)
Detailed message left on pts phone informing her to stop taking the HCTZ. Pt asked to call back with any questions about this and to schedule a nurse visit for a med rec.

## 2017-10-06 NOTE — Telephone Encounter (Signed)
Will check with MD on this medication.  Jazmin Hartsell,CMA

## 2017-10-06 NOTE — Telephone Encounter (Signed)
Pt called scheduling line and wasn't happy no one has called her back about her medications yet. Please call pt back to discuss these meds.

## 2017-10-11 ENCOUNTER — Ambulatory Visit: Payer: BLUE CROSS/BLUE SHIELD | Admitting: Neurology

## 2017-10-18 ENCOUNTER — Ambulatory Visit (INDEPENDENT_AMBULATORY_CARE_PROVIDER_SITE_OTHER): Payer: BLUE CROSS/BLUE SHIELD | Admitting: *Deleted

## 2017-10-18 DIAGNOSIS — I639 Cerebral infarction, unspecified: Secondary | ICD-10-CM

## 2017-10-19 ENCOUNTER — Ambulatory Visit: Payer: BLUE CROSS/BLUE SHIELD | Admitting: Internal Medicine

## 2017-10-20 ENCOUNTER — Other Ambulatory Visit: Payer: Self-pay | Admitting: Family Medicine

## 2017-10-20 MED ORDER — ATORVASTATIN CALCIUM 40 MG PO TABS: 40.0000 mg | ORAL_TABLET | Freq: Every day | ORAL | 0 refills | Status: DC

## 2017-10-20 NOTE — Progress Notes (Signed)
[facility] Summary Report / Loop Recorder 

## 2017-10-23 ENCOUNTER — Ambulatory Visit: Payer: BLUE CROSS/BLUE SHIELD | Admitting: Neurology

## 2017-10-24 ENCOUNTER — Encounter: Payer: Self-pay | Admitting: Neurology

## 2017-10-24 ENCOUNTER — Ambulatory Visit (INDEPENDENT_AMBULATORY_CARE_PROVIDER_SITE_OTHER): Payer: BLUE CROSS/BLUE SHIELD | Admitting: Neurology

## 2017-10-24 VITALS — BP 175/91 | HR 68 | Ht 65.0 in | Wt 195.4 lb

## 2017-10-24 DIAGNOSIS — I6340 Cerebral infarction due to embolism of unspecified cerebral artery: Secondary | ICD-10-CM

## 2017-10-24 DIAGNOSIS — I10 Essential (primary) hypertension: Secondary | ICD-10-CM

## 2017-10-24 DIAGNOSIS — E785 Hyperlipidemia, unspecified: Secondary | ICD-10-CM | POA: Diagnosis not present

## 2017-10-24 DIAGNOSIS — M542 Cervicalgia: Secondary | ICD-10-CM

## 2017-10-24 MED ORDER — PRAVASTATIN SODIUM 40 MG PO TABS: 40.0000 mg | ORAL_TABLET | Freq: Every day | ORAL | 3 refills | Status: DC

## 2017-10-24 MED ORDER — HYDRALAZINE HCL 50 MG PO TABS: 50.0000 mg | ORAL_TABLET | Freq: Three times a day (TID) | ORAL | 0 refills | Status: DC

## 2017-10-24 NOTE — Progress Notes (Signed)
STROKE NEUROLOGY FOLLOW UP NOTE  NAME: Teresa [name] DOB: 1961/01/20  REASON FOR VISIT: stroke follow up HISTORY FROM: pt and chart  Today we had the pleasure of seeing Teresa [name] in follow-up at our Neurology Clinic. Pt was accompanied by no one.   History Summary [name] is a 57 y.o. L handed female with history of palpitations, positive TB test, Graves' disease s/p radiation on synthroid, migraines, colon cancer, and smoker admitted on 09/15/17 for left-sided weakness, blurred vision, ataxia, and elevated BP.   MRI showed multiple small acute infarct involving the right MCA, right MCA/ACA, left MCA/PCA, left cerebellum.  Also had chronic CMBs likely due to chronic hypertension.  CT head and neck unremarkable.  TTE EF 60 to 65%.  TEE no source of emboli, no PFO.  Loop recorder placed.  LDL 118 and A1c 6.0.  Hypercoagulable and autoimmune work-up negative.  BP high during admission, discharged on Coreg, hydralazine and spironolactone.  Encouraged to quit smoking.  She was discharged with aspirin and Lipitor.  Interval History During the interval time, the patient has been doing well.  At neuro baseline.  However complains of frequent leg cramping, she stopped hydralazine and Lipitor by herself due to leg cramping.  BP high today 175/97.  She agrees to resume hydralazine. We will change Lipitor to pravastatin.  Loop recorder so far no A. fib.  She has quit smoking 3 weeks ago.  Patient complains of neck pain at the back of the neck, consistent with cervical radiculopathy.  Patient would like to have physical therapy for that.  REVIEW OF SYSTEMS: Full 14 system review of systems performed and notable only for those listed below and in HPI above, all others are negative:  Constitutional:   Cardiovascular: Chest pain, leg swelling Ear/Nose/Throat: Facial swelling, ringing in ears Skin:  Eyes: Eye redness, eye pain, blurry vision Respiratory: SOB Gastroitestinal: Swollen abdomen,  constipation, diarrhea Genitourinary:  Hematology/Lymphatic:   Endocrine:  Musculoskeletal: Joint pain, joint swelling, back pain, muscle cramps, neck pain, neck stiffness Allergy/Immunology:   Neurological: Headache Psychiatric:  Sleep: Restless legs  The following represents the patient's updated allergies and side effects list: Allergies  Allergen Reactions  . Bactrim [Sulfamethoxazole-Trimethoprim] Anaphylaxis  . Clonidine Derivatives Swelling and Other (See Comments)    Facial swelling  . Amlodipine Swelling    Leg swelling  . Lisinopril Swelling    Lip swelling  . Aspirin Nausea Only  . Latex Rash    The neurologically relevant items on the patient's problem list were reviewed on today's visit.  Neurologic Examination  A problem focused neurological exam (12 or more points of the single system neurologic examination, vital signs counts as 1 point, cranial nerves count for 8 points) was performed.  Blood pressure (!) 175/91, pulse 68, height 5\' 5"  (1.651 m), weight 195 lb 6.4 oz (88.6 kg).  General - Well nourished, well developed, in no apparent distress.  Ophthalmologic - Sharp disc margins OU.   Cardiovascular - Regular rate and rhythm  Mental Status -  Level of arousal and orientation to time, place, and person were intact. Language including expression, naming, repetition, comprehension was assessed and found intact. Attention span and concentration were normal. Fund of Knowledge was assessed and was intact.  Cranial Nerves II - XII - II - Visual field intact OU. III, IV, VI - Extraocular movements intact. Right Palpebral fissure bigger than left V - Facial sensation intact bilaterally. VII - Facial movement intact bilaterally. VIII - Hearing &  vestibular intact bilaterally. X - Palate elevates symmetrically. XI - Chin turning & shoulder shrug intact bilaterally. XII - Tongue protrusion intact.  Motor Strength - The patient's strength was normal in all  extremities and pronator drift was absent.  Bulk was normal and fasciculations were absent.   Motor Tone - Muscle tone was assessed at the neck and appendages and was normal.  Reflexes - The patient's reflexes were 1+ in all extremities and she had no pathological reflexes.  Sensory - Light touch, temperature/pinprick, vibration and proprioception, and Romberg testing were assessed and were normal.    Coordination - The patient had normal movements in the hands and feet with no ataxia or dysmetria.  Tremor was absent.  Gait and Station - The patient's transfers, posture, gait, station, and turns were observed as normal.   Functional score  mRS = 0   0 - No symptoms.   1 - No significant disability. Able to carry out all usual activities, despite some symptoms.   2 - Slight disability. Able to look after own affairs without assistance, but unable to carry out all previous activities.   3 - Moderate disability. Requires some help, but able to walk unassisted.   4 - Moderately severe disability. Unable to attend to own bodily needs without assistance, and unable to walk unassisted.   5 - Severe disability. Requires constant nursing care and attention, bedridden, incontinent.   6 - Dead.   NIH Stroke Scale = 0   Data reviewed: I personally reviewed the images and agree with the radiology interpretations.  Ct Angio Head W Or Wo Contrast Ct Angio Neck W Or Wo Contrast 09/15/2017 IMPRESSION:  1. No emergent large vessel occlusion or hemodynamically significant stenosis of the arteries of the head and neck.  2. Minimal bilateral carotid bifurcation atherosclerotic calcification without stenosis.   Ct Head Wo Contrast 09/15/2017 IMPRESSION:  No acute intracranial abnormality. Mild ethmoid sinusitis.   Mr Brain Wo Contrast 09/15/2017 IMPRESSION:  Multiple small acute infarcts involving the right MCA territory, left PCA territory, and left PICA territory. Findings suggest  multiple emboli. Chronic microhemorrhage right posterior temporal lobe and right thalamus possibly due to hypertension.   Transthoracic Echocardiogram - Left ventricle: The cavity size was normal. There was moderate concentric hypertrophy. Systolic function was normal. Theestimated ejection fraction was in the range of 60% to 65%. Wallmotion was normal; there were no regional [place] motionabnormalities. Doppler parameters are consistent with abnormalleft ventricular relaxation (grade 1 diastolic dysfunction).Doppler parameters are consistent with elevated ventricularend-diastolic filling pressure. - Aortic valve: There was moderate regurgitation. - Mitral valve: Calcified annulus. Mildly thickened leaflets .There was mild regurgitation. - Left atrium: The atrium was mildly dilated. - Right ventricle: Systolic function was normal. - Tricuspid valve: There was mild regurgitation. - Pulmonic valve: There was trivial regurgitation. - Pulmonary arteries: The main pulmonary artery was normal-sized.Systolic pressure was within the normal range. - Inferior vena cava: The vessel was normal in size. - Pericardium, extracardiac: There was no pericardial effusion.  TEE 8. No LAA thrombus 9. Negative for PFO 10. Mild LAE 11. Trace to mild MR 12. Mild central AI 13. LVEF 60-65%  Component     Latest Ref Rng & Units 09/15/2017 09/18/2017  Cholesterol     0 - 200 mg/dL 171   Triglycerides     <150 mg/dL 63   HDL Cholesterol     >40 mg/dL 40 (L)   Total CHOL/HDL Ratio     RATIO 4.3  VLDL     0 - 40 mg/dL 13   LDL (calc)     0 - 99 mg/dL 118 (H)   PTT Lupus Anticoagulant     0.0 - 51.9 sec  36.9  DRVVT     0.0 - 47.0 sec  38.5  Lupus Anticoag Interp       Comment:  Beta-2 Glycoprotein I Ab, IgG     0 - 20 GPI IgG units  <9  Beta-2-Glycoprotein I IgM     0 - 32 GPI IgM units  <9  Beta-2-Glycoprotein I IgA     0 - 25 GPI IgA units  <9  Anticardiolipin Ab,IgG,Qn     0 - 14 GPL U/mL   <9  Anticardiolipin Ab,IgM,Qn     0 - 12 MPL U/mL  12  Anticardiolipin Ab,IgA,Qn     0 - 11 APL U/mL  <9  Hemoglobin A1C     4.8 - 5.6 % 6.0 (H)   Mean Plasma Glucose     mg/dL 125.5   TSH     0.350 - 4.500 uIU/mL 2.407   T4,Free(Direct)     0.61 - 1.12 ng/dL 1.18 (H)   ds DNA Ab     0 - 9 IU/mL  1  ANA Ab, IFA       Negative  Sickle Cell Screen     Negative  Negative  Sed Rate     0 - 22 mm/hr  20  CRP     <1.0 mg/dL  0.8  Homocysteine     0.0 - 15.0 umol/L  12.3    Assessment: As you may recall, she is a 57 y.o. African American female with PMH of L handed female with history of palpitations, positive TB test, Graves' disease s/p radiation on synthroid, migraines, colon cancer, and smoker admitted on 09/15/17 for left-sided weakness, blurred vision, ataxia, and elevated BP.   MRI showed multiple small acute infarct involving the right MCA, right MCA/ACA, left MCA/PCA, left cerebellum.  Also had chronic CMBs likely due to chronic hypertension.  CT head and neck unremarkable.  TTE EF 60 to 65%.  TEE no source of emboli, no PFO.  Loop recorder placed.  LDL 118 and A1c 6.0.  Hypercoagulable and autoimmune work-up negative.  BP high during admission, discharged on Coreg, hydralazine and spironolactone, aspirin and Lipitor. However she stopped hydralazine and Lipitor by herself due to leg cramping.  BP still high today, will resume hydralazine. Will change Lipitor to pravastatin.  Loop recorder so far no A. fib.  She has quit smoking 3 weeks ago.  Patient complains of neck pain at the back of the neck, consistent with cervical radiculopathy will refer to PT.  Plan:  - continue ASA for stroke prevention - change lipitor to pravastatin for stroke prevention and cholesterol control due to leg cramp - continue to follow up with cardiology - continue to monitor loop recorder - Follow up with your primary care physician for stroke risk factor modification. Recommend maintain blood pressure  goal <130/80, diabetes with hemoglobin A1c goal below 7.0% and lipids with LDL cholesterol goal below 70 mg/dL.  - resume hydralazine for BP control - continue abstain from smoking - check BP at home and record and adjust BP meds if needed - healthy diet and regular exercise - PT for neck pain.  - follow up in 3 months with Teresa [name].   I spent more than 25 minutes of face to face time with the  patient. Greater than 50% of time was spent in counseling and coordination of care. We discussed BP control, medication change, neck pain and loop recorder monitoring   Orders Placed This Encounter  Procedures  . Ambulatory referral to Physical Therapy    Referral Priority:   Routine    Referral Type:   Physical Medicine    Referral Reason:   Specialty Services Required    Requested Specialty:   Physical Therapy    Number of Visits Requested:   1    Meds ordered this encounter  Medications  . hydrALAZINE (APRESOLINE) 50 MG tablet    Sig: Take 1 tablet (50 mg total) by mouth every 8 (eight) hours.    Dispense:  90 tablet    Refill:  0  . pravastatin (PRAVACHOL) 40 MG tablet    Sig: Take 1 tablet (40 mg total) by mouth daily.    Dispense:  90 tablet    Refill:  3    Patient Instructions  - continue ASA for stroke prevention - change lipitor to pravastatin for stroke prevention and cholesterol control due to leg cramp - continue to follow up with cardiology - continue to monitor loop recorder - Follow up with your primary care physician for stroke risk factor modification. Recommend maintain blood pressure goal <130/80, diabetes with hemoglobin A1c goal below 7.0% and lipids with LDL cholesterol goal below 70 mg/dL.  - resume hydralazine for BP control - continue abstain from smoking - check BP at home and record and adjust BP meds if needed - healthy diet and regular exercise - PT for neck pain.  - follow up in 3 months with Teresa [name].    Teresa Hawking, MD PhD [facility] [address] [place], [place] [address] [phone]

## 2017-10-24 NOTE — Patient Instructions (Addendum)
-   continue ASA for stroke prevention - change lipitor to pravastatin for stroke prevention and cholesterol control due to leg cramp - continue to follow up with cardiology - continue to monitor loop recorder - Follow up with your primary care physician for stroke risk factor modification. Recommend maintain blood pressure goal <130/80, diabetes with hemoglobin A1c goal below 7.0% and lipids with LDL cholesterol goal below 70 mg/dL.  - resume hydralazine for BP control - continue abstain from smoking - check BP at home and record and adjust BP meds if needed - healthy diet and regular exercise - PT for neck pain.  - follow up in 3 months with Teresa [name].

## 2017-10-25 ENCOUNTER — Other Ambulatory Visit: Payer: Self-pay | Admitting: Family Medicine

## 2017-10-25 DIAGNOSIS — I10 Essential (primary) hypertension: Secondary | ICD-10-CM

## 2017-10-25 MED ORDER — PANTOPRAZOLE SODIUM 40 MG PO TBEC: 40.0000 mg | DELAYED_RELEASE_TABLET | Freq: Every day | ORAL | 0 refills | Status: DC

## 2017-10-25 MED ORDER — CARVEDILOL 12.5 MG PO TABS: 12.5000 mg | ORAL_TABLET | Freq: Two times a day (BID) | ORAL | 0 refills | Status: DC

## 2017-10-25 NOTE — Telephone Encounter (Signed)
Pt  is calling because she needs refills on her BP medication. She said that she was told that we could not fill this for her. SHe was talking about the refill in March and wasn't sure jw

## 2017-10-25 NOTE — Telephone Encounter (Signed)
Will forward to MD. Jazmin Hartsell,CMA  

## 2017-10-27 ENCOUNTER — Other Ambulatory Visit: Payer: Self-pay | Admitting: Family Medicine

## 2017-10-27 DIAGNOSIS — Z1231 Encounter for screening mammogram for malignant neoplasm of breast: Secondary | ICD-10-CM

## 2017-11-06 ENCOUNTER — Telehealth: Payer: Self-pay | Admitting: Family Medicine

## 2017-11-06 NOTE — Telephone Encounter (Signed)
Yes, absolutely. Thanks!

## 2017-11-06 NOTE — Telephone Encounter (Signed)
Patient has an appointment with you tomorrow, can this be discussed then?  Teresa [name],CMA

## 2017-11-06 NOTE — Telephone Encounter (Signed)
Pt informed she can discuss with pcp tomm at her office visit.

## 2017-11-06 NOTE — Telephone Encounter (Signed)
Thanks. I need clarification on some of her meds. I called without any response. Please confirm if she is on Hydralazine. Obtain information on all BP meds she is on. . Thanks.

## 2017-11-06 NOTE — Telephone Encounter (Signed)
Pt said she had a missed call from Dr. Gwendlyn Deutscher and was wondering what she was calling about. Please advise

## 2017-11-07 ENCOUNTER — Ambulatory Visit (INDEPENDENT_AMBULATORY_CARE_PROVIDER_SITE_OTHER): Payer: BLUE CROSS/BLUE SHIELD | Admitting: Family Medicine

## 2017-11-07 ENCOUNTER — Encounter: Payer: Self-pay | Admitting: Family Medicine

## 2017-11-07 ENCOUNTER — Other Ambulatory Visit: Payer: Self-pay

## 2017-11-07 ENCOUNTER — Other Ambulatory Visit: Payer: Self-pay | Admitting: Neurology

## 2017-11-07 VITALS — BP 178/80 | HR 69 | Ht 65.0 in | Wt 196.0 lb

## 2017-11-07 DIAGNOSIS — I10 Essential (primary) hypertension: Secondary | ICD-10-CM | POA: Diagnosis not present

## 2017-11-07 DIAGNOSIS — M542 Cervicalgia: Secondary | ICD-10-CM | POA: Diagnosis not present

## 2017-11-07 DIAGNOSIS — R609 Edema, unspecified: Secondary | ICD-10-CM

## 2017-11-07 DIAGNOSIS — E039 Hypothyroidism, unspecified: Secondary | ICD-10-CM

## 2017-11-07 DIAGNOSIS — I6340 Cerebral infarction due to embolism of unspecified cerebral artery: Secondary | ICD-10-CM

## 2017-11-07 NOTE — Progress Notes (Signed)
Subjective:     Patient ID: Teresa [name], female   DOB: 02-03-1961, 57 y.o.   MRN: [id number]  HPI [id number] for follow-up. Teresa [name] has been compliant with Aldactone 25 mg qd, hydralaziene 25 mg TID and Coreg 12.5 BID. Teresa [name] uses wrist cuff BP monitoring machine at home and her numbers has been high around 180/90. Denies headache currently. Neck stiffness: C/O pain of the back of her neck which Teresa [name] has had all her life,Teresa [name] feels this is related to her hunched back, denies recent trauma. Here for follow-up. Edema: C/O left knee and ankle swelling which occurs intermittently. Teresa [name] endorsed occasional knee and ankle pain, Teresa [name] is not a heavy salt eater. However, Teresa [name] drinks a lot of water. Teresa [name] is concern this is related to her thyroid dysfunction. Thyroid dysfunction: Here for follow-up. Teresa [name] feels like her eyes are bulging than usually and feels irritated. Teresa [name] feels sick in general. Teresa [name] will like to check her numbers. Teresa [name] is compliant with her Synthroid. Stroke: left arm weakness persists. Teresa [name] is compliant with her meds and neuro follow-up.   Current Outpatient Medications on File Prior to Visit  Medication Sig Dispense Refill  . aspirin 81 MG chewable tablet Chew 1 tablet (81 mg total) by mouth daily. 30 tablet 0  . carvedilol (COREG) 12.5 MG tablet Take 1 tablet (12.5 mg total) by mouth 2 (two) times daily with a meal. 60 tablet 0  . cycloSPORINE (RESTASIS) 0.05 % ophthalmic emulsion Place 1 drop into both eyes 2 (two) times daily.     . hydrALAZINE (APRESOLINE) 50 MG tablet Take 1 tablet (50 mg total) by mouth every 8 (eight) hours. 90 tablet 0  . levothyroxine (SYNTHROID, LEVOTHROID) 112 MCG tablet TAKE ONE TABLET BY MOUTH ONCE DAILY BEFORE BREAKFAST 90 tablet 0  . mometasone (NASONEX) 50 MCG/ACT nasal spray Place 2 sprays into the nose daily. 17 g 12  . naphazoline-pheniramine (VISINE-A) 0.025-0.3 % ophthalmic solution Place 1 drop into both eyes 2 (two) times daily.    . pantoprazole (PROTONIX) 40 MG  tablet Take 1 tablet (40 mg total) by mouth daily. 30 tablet 0  . Polyethylene Glycol 400 (BLINK TEARS OP) Place 1 drop into both eyes 4 (four) times daily as needed (dry eyes).    . pravastatin (PRAVACHOL) 40 MG tablet Take 1 tablet (40 mg total) by mouth daily. 90 tablet 3  . spironolactone (ALDACTONE) 25 MG tablet Take 1 tablet (25 mg total) by mouth daily. 90 tablet 3   Current Facility-Administered Medications on File Prior to Visit  Medication Dose Route Frequency Provider Last Rate Last Dose  . olopatadine (PATANOL) 0.1 % ophthalmic solution 1 drop  1 drop Both Eyes BID McDiarmid, Blane Ohara, MD       Past Medical History:  Diagnosis Date  . Allergy   . Anemia   . Cervical disc disorder with radiculopathy of cervical region 02/10/2016  . Chronic lower back pain   . Diverticulosis   . Gastritis   . GERD (gastroesophageal reflux disease)   . H/O hiatal hernia   . Hiatal hernia   . History of amputation of lesser toe of left foot ([place]) 03/11/2015  . History of colon cancer   . Hypertension   . Hyperthyroidism    hx  . Hypothyroidism   . Migraine headache    "a few times/yr" (03/05/2015)  . Neck strain 02/06/2012  . Osteomyelitis ([place])    [name] 03/05/2015  . Osteomyelitis of toe of left foot ([place]) 03/05/2015  .  Palpitations 01/05/2016  . Positive TB test   . Stroke ([place])   . Tubular adenoma of colon   . Tubular adenoma of colon    Vitals:   11/07/17 1040  BP: (!) 182/78  Pulse: 69  SpO2: 98%  Weight: 196 lb (88.9 kg)  Height: 5\' 5"  (1.651 m)     Review of Systems  Eyes: Negative for visual disturbance.  Respiratory: Negative.   Cardiovascular: Negative.   Gastrointestinal: Negative.   Musculoskeletal: Positive for arthralgias, neck pain and neck stiffness. Negative for joint swelling.       Edema  Neurological:       Arm weakness  All other systems reviewed and are negative.      Objective:   Physical Exam  Constitutional: Teresa [name] is oriented to person, place, and  time. Teresa [name] appears well-developed. No distress.  Eyes: Pupils are equal, round, and reactive to light. Conjunctivae are normal.  Exophthalmos B/L  Neck: Neck supple. No thyromegaly present.  Cardiovascular: Normal rate, regular rhythm and normal heart sounds.  No murmur heard. Pulmonary/Chest: Effort normal and breath sounds normal. No stridor. No respiratory distress.  Abdominal: Soft. Bowel sounds are normal. Teresa [name] exhibits no distension. There is no tenderness.  Musculoskeletal: Normal range of motion. Teresa [name] exhibits no edema.       Right shoulder: Normal.       Left shoulder: Normal.       Cervical back: Normal.  Neurological: Teresa [name] is alert and oriented to person, place, and time. Teresa [name] displays normal reflexes. No cranial nerve deficit or sensory deficit. Coordination normal.  Nursing note and vitals reviewed.      Assessment:     HTN Neck stiffness Left knee and ankle edema Hypothyroidism Stroke    Plan:     Check problem list.

## 2017-11-07 NOTE — Assessment & Plan Note (Addendum)
BP pretty elevated. She is compliant with meds. I went up on her Aldactone. Take 2 tabs daily and I will send in a new refill. Conitnue Coreg and Hydralazine as it is. F/U in 2 weeks. Continue home BP check. Return sooner if symptomatic.

## 2017-11-07 NOTE — Assessment & Plan Note (Signed)
Of left knee and ankle. Currently asymptomatic. ?? Arthritis. Reduce salt intake. Pain control with Tylenol prn. If worsening, we will obtain labs in the future to assess kidney function. Most recent creatinine is within normal range.

## 2017-11-07 NOTE — Assessment & Plan Note (Signed)
No acute findings. Compliant with ASA and Statin. PT referral to help with persistent weakness.

## 2017-11-07 NOTE — Assessment & Plan Note (Signed)
Worsening exophthalmos. Recent T4 elevation, otherwise normal TFT. Recheck TFT today. Will contact her with result.

## 2017-11-07 NOTE — Assessment & Plan Note (Signed)
??   Arthritis. Neck home exercise recommended. Will also refer to PT. Tylenol as needed for pain.

## 2017-11-07 NOTE — Patient Instructions (Addendum)
Spironolactone oral suspension What is this medicine? SPIRONOLACTONE (speer on oh LAK tone) is a diuretic. It helps you make more urine and to lose excess water from your body. This medicine is used to treat high blood pressure, and swelling from heart, kidney, or liver disease. This medicine may be used for other purposes; ask your health care provider or pharmacist if you have questions. COMMON BRAND NAME(S): CAROSPIR What should I tell my health care provider before I take this medicine? They need to know if you have any of these conditions: -Addison's disease -high levels of potassium in the blood -kidney disease -liver disease -an unusual or allergic reaction to spironolactone, other medicines, foods, dyes, or preservatives -pregnant or trying to get pregnant -breast-feeding How should I use this medicine? Take this medicine by mouth. Follow the directions on the prescription label. Shake well before using. Use a specially marked spoon or dropper to measure each dose. Ask your pharmacist if you do not have one. Household spoons are not accurate. You can take it with or without food. However, you should always take it the same way each time. Take your medicine at regular intervals. Do not take it more often than directed. Do not stop taking except on your doctor's advice. Talk to your pediatrician regarding the use of this medicine in children. Special care may be needed. Overdosage: If you think you have taken too much of this medicine contact a poison control center or emergency room at once. NOTE: This medicine is only for you. Do not share this medicine with others. What if I miss a dose? If you miss a dose, take it as soon as you can. If it is almost time for your next dose, take only that dose. Do not take double or extra doses. What may interact with this medicine? Do not take this medication with any of the following medications: -cidofovir -eplerenone -tranylcypromine This  medication may also interact with the following medications: -acetylsalicylic acid -certain medicines for blood pressure, heart disease like benazepril, lisinopril, losartan, valsartan -certain medicines that treat or prevent blood clots like heparin and enoxaparin -cholestyramine -digoxin -lithium -NSAIDs, medicines for pain and inflammation, like ibuprofen or naproxen -trimethoprim This list may not describe all possible interactions. Give your health care provider a list of all the medicines, herbs, non-prescription drugs, or dietary supplements you use. Also tell them if you smoke, drink alcohol, or use illegal drugs. Some items may interact with your medicine. What should I watch for while using this medicine? Visit your doctor or health care professional for regular checks on your progress. Check your blood pressure as directed. Ask your doctor or health care professional what your blood pressure should be and when you should contact him or her. Do not treat yourself for coughs, colds, or pain while you are using this medicine without asking your doctor or health care professional for advice. Some ingredients may increase your blood pressure. You may get dizzy. Do not drive, use machinery, or do anything that needs mental alertness until you know how this medicine affects you. Do not stand or sit up quickly, especially if you are an older patient. This reduces the risk of dizzy or fainting spells. Avoid alcoholic drinks; they can make you more dizzy. What side effects may I notice from receiving this medicine? Side effects that you should report to your doctor or health care professional as soon as possible: -allergic reactions like skin rash, itching or hives, swelling of the face, lips,  or tongue -signs and symptoms of high blood sugar such as dizziness; dry mouth; dry skin; fruity breath; nausea; stomach pain; increased hunger or thirst; increased urination -signs and symptoms of increased  potassium like muscle weakness; chest pain; or fast, irregular heartbeat -signs and symptoms of kidney injury like trouble passing urine or change in the amount of urine -signs and symptoms of low blood pressure like dizziness; feeling faint or lightheaded, falls; unusually weak or tired -signs and symptoms of low calcium like fast heartbeat; muscle cramps or muscle pain; pain, tingling, numbness in the hands or feet; seizures -signs and symptoms of low magnesium like muscle cramps, pain, or weakness; tremors; seizures; or fast, irregular heartbeat -symptoms of gout Side effects that usually do not require medical attention (report these to your doctor or health care professional if they continue or are bothersome): -breast enlargement in both males and females -dizziness -headache -missed menstrual periods -upset stomach This list may not describe all possible side effects. Call your doctor for medical advice about side effects. You may report side effects to FDA at 1-800-FDA-1088. Where should I keep my medicine? Keep out of the reach of children. Store at room temperature between 15 and 30 degrees C (59 and 86 degrees F). Throw away any unused medicine after the expiration date. NOTE: This sheet is a summary. It may not cover all possible information. If you have questions about this medicine, talk to your doctor, pharmacist, or health care provider.  2018 Elsevier/Gold Standard (2016-02-24 08:45:55) It was nice seeing you today. Please increase aldactone to 50 mg daily. Continue to monitor your BP at home and bring me a BP log at your next visit. Please make 2 weeks follow-up appointment.

## 2017-11-10 ENCOUNTER — Other Ambulatory Visit: Payer: Self-pay | Admitting: Family Medicine

## 2017-11-10 ENCOUNTER — Telehealth: Payer: Self-pay | Admitting: Family Medicine

## 2017-11-10 ENCOUNTER — Encounter: Payer: Self-pay | Admitting: Family Medicine

## 2017-11-10 MED ORDER — HYDRALAZINE HCL 50 MG PO TABS: 50.0000 mg | ORAL_TABLET | Freq: Three times a day (TID) | ORAL | 1 refills | Status: DC

## 2017-11-10 NOTE — Telephone Encounter (Signed)
HIPPA compliant message left for her to call back.

## 2017-11-12 LAB — BASIC METABOLIC PANEL
BUN/Creatinine Ratio: 10 (ref 9–23)
BUN: 12 mg/dL (ref 6–24)
CO2: 17 mmol/L — ABNORMAL LOW (ref 20–29)
Calcium: 10.2 mg/dL (ref 8.7–10.2)
Chloride: 106 mmol/L (ref 96–106)
Creatinine, Ser: 1.22 mg/dL — ABNORMAL HIGH (ref 0.57–1.00)
GFR calc Af Amer: 58 mL/min/{1.73_m2} — ABNORMAL LOW (ref >59)
GFR calc non Af Amer: 50 mL/min/{1.73_m2} — ABNORMAL LOW (ref >59)
Glucose: 103 mg/dL — ABNORMAL HIGH (ref 65–99)
Potassium: 4.6 mmol/L (ref 3.5–5.2)
Sodium: 144 mmol/L (ref 134–144)

## 2017-11-12 LAB — TSH+T3+FREE T4+T3 FREE
Free T-3: 2.9 pg/mL
Free T4 by Dialysis: 1.5 ng/dL
TSH: 3 uU/mL
Triiodothyronine (T-3), Serum: 105 ng/dL

## 2017-11-13 ENCOUNTER — Ambulatory Visit: Payer: BLUE CROSS/BLUE SHIELD | Attending: Neurology | Admitting: Rehabilitation

## 2017-11-13 ENCOUNTER — Other Ambulatory Visit: Payer: Self-pay

## 2017-11-13 ENCOUNTER — Encounter: Payer: Self-pay | Admitting: Rehabilitation

## 2017-11-13 ENCOUNTER — Telehealth: Payer: Self-pay | Admitting: Family Medicine

## 2017-11-13 DIAGNOSIS — M25512 Pain in left shoulder: Secondary | ICD-10-CM | POA: Diagnosis present

## 2017-11-13 DIAGNOSIS — G8929 Other chronic pain: Secondary | ICD-10-CM

## 2017-11-13 DIAGNOSIS — M542 Cervicalgia: Secondary | ICD-10-CM | POA: Insufficient documentation

## 2017-11-13 DIAGNOSIS — R293 Abnormal posture: Secondary | ICD-10-CM | POA: Insufficient documentation

## 2017-11-13 DIAGNOSIS — M25511 Pain in right shoulder: Secondary | ICD-10-CM | POA: Diagnosis present

## 2017-11-13 LAB — CUP PACEART REMOTE DEVICE CHECK
Date Time Interrogation Session: 20190424203542
Implantable Pulse Generator Implant Date: 20190325

## 2017-11-13 NOTE — Patient Instructions (Signed)
Neurovascular: Median Nerve Glide With Cervical Bias    Stand with right arm out to side, palm flat against [place], fingers up, elbow straight. Rotate body away from [place] (when R hand on [place]).  Only add this when you no longer feel stretch: Slowly move opposite side ear toward shoulder as far as possible without pain.   Repeat _3___ times per set for 20 secs each. Do __1__ sets per session. Do __5-7__ sessions per week.  Copyright  VHI. All rights reserved.   Axial Extension (Chin Tuck)    Do this lying down! Pull chin in and lengthen back of neck. Hold __5__ seconds while counting out loud. Repeat __5-8__ times. Do __2__ sessions per day.  http://gt2.exer.us/450   Copyright  VHI. All rights reserved.

## 2017-11-13 NOTE — Therapy (Signed)
[facility] [address] [place], [place], [address] Phone: [phone]   Fax:  [phone]  Physical Therapy Evaluation  Patient Details  Name: Teresa [name] MRN: [id number] Date of Birth: 10/20/1960 Referring Provider: [name], MD   Encounter Date: 11/13/2017  PT End of Session - 11/13/17 1304    Visit Number  1    Number of Visits  17    Date for PT Re-Evaluation  01/12/18    Authorization Type  BCBS    Authorization - Visit Number  1    Authorization - Number of Visits  30    PT Start Time  0933    PT Stop Time  1018    PT Time Calculation (min)  45 min    Activity Tolerance  Patient tolerated treatment well    Behavior During Therapy  [employer] for tasks assessed/performed       Past Medical History:  Diagnosis Date  . Allergy   . Anemia   . Cervical disc disorder with radiculopathy of cervical region 02/10/2016  . Chronic lower back pain   . Diverticulosis   . Gastritis   . GERD (gastroesophageal reflux disease)   . H/O hiatal hernia   . Hiatal hernia   . History of amputation of lesser toe of left foot ([place]) 03/11/2015  . History of colon cancer   . Hypertension   . Hyperthyroidism    hx  . Hypothyroidism   . Migraine headache    "a few times/yr" (03/05/2015)  . Neck strain 02/06/2012  . Osteomyelitis ([place])    [name] 03/05/2015  . Osteomyelitis of toe of left foot ([place]) 03/05/2015  . Palpitations 01/05/2016  . Positive TB test   . Stroke ([place])   . Tubular adenoma of colon   . Tubular adenoma of colon     Past Surgical History:  Procedure Laterality Date  . ABDOMINAL HYSTERECTOMY  2006  . AMPUTATION TOE Left 03/05/2015   Procedure: AMPUTATION LEFT 5TH  TOE;  Surgeon: Wylene Simmer, MD;  Location: [facility];  Service: Orthopedics;  Laterality: Left;  . APPENDECTOMY  2006  . BREAST BIOPSY Left 11/26/2014  . [place]  . COLONOSCOPY    . GALLBLADDER SURGERY    . LOOP RECORDER  INSERTION N/A 09/18/2017   Procedure: LOOP RECORDER INSERTION;  Surgeon: Evans Lance, MD;  Location: [facility];  Service: Cardiovascular;  Laterality: N/A;  . TEE WITHOUT CARDIOVERSION N/A 09/18/2017   Procedure: TRANSESOPHAGEAL ECHOCARDIOGRAM (TEE);  Surgeon: Pixie Casino, MD;  Location: [facility];  Service: Cardiovascular;  Laterality: N/A;  . TOE AMPUTATION Left 03/05/2015   5th toe  . TUBAL LIGATION Bilateral 1990  . UPPER GASTROINTESTINAL ENDOSCOPY      There were no vitals filed for this visit.   Subjective Assessment - 11/13/17 0937    Subjective  "My neck stays really tight, my shoulders are tight too.  I have headaches too, they are at the back of my neck and go up to front of head."    Pertinent History  History of old CVA with L sided weakness.      Limitations  House hold activities    How long can you stand comfortably?  5 hours at work without pain    Patient Stated Goals  "to get rid of this pain and tightness"     Currently in Pain?  Yes    Pain Score  6  Pain Location  Neck    Pain Orientation  Right;Left    Pain Descriptors / Indicators  Tightness;Stabbing;Aching    Pain Type  Chronic pain    Pain Radiating Towards  into shoulders    Pain Onset  More than a month ago    Pain Frequency  Constant    Aggravating Factors   working    Pain Relieving Factors  rest, heat sometimes         [facility] PT Assessment - 11/13/17 0001      Assessment   Medical Diagnosis  cervicalgia    Referring Provider  [name], MD    Onset Date/Surgical Date  -- Has noted increased pain in last 6 months    Prior Therapy  3-4 years ago for neck      Balance Screen   Has the patient fallen in the past 6 months  No    Has the patient had a decrease in activity level because of a fear of falling?   No    Is the patient reluctant to leave their home because of a fear of falling?   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    Leisure  Play Bingo, fish       Cognition   Overall Cognitive Status  Within Functional Limits for tasks assessed      Sensation   Light Touch  Impaired Detail    Light Touch Impaired Details  Impaired RUE;Impaired LUE has numbness/tingling with arms elevated at work      Programmer, applications Movements are Fluid and Coordinated  Yes    Fine Motor Movements are Fluid and Coordinated  Yes      ROM / Strength   AROM / PROM / Strength  Strength;AROM      AROM   AROM Assessment Site  Cervical    Cervical Flexion  37    Cervical Extension  45    Cervical - Right Side Bend  30 painful    Cervical - Left Side Bend  34 painful    Cervical - Right Rotation  70    Cervical - Left Rotation  62 limited and with pain      Strength   Strength Assessment Site  Shoulder;Elbow    Right/Left Shoulder  Right;Left    Right Shoulder Flexion  5/5    Right Shoulder ABduction  4+/5    Left Shoulder Flexion  5/5 but pain at biceps head    Left Shoulder ABduction  3+/5 with pain at biceps head    Right/Left Elbow  Right;Left    Right Elbow Flexion  5/5    Right Elbow Extension  5/5    Left Elbow Flexion  5/5    Left Elbow Extension  4/5      Palpation   Palpation comment  Pt with decreased cerivcal and upper thoracic mobility in both supine performing gengle upglides as  well as in prone with Grade II/III mobilizations, esp in lower cervical spine. Note increased paraspinal muscle tightness bilaterally, B upper trap and levator tightness.  Pt needs max verbal cues and tactile facilitation for chin tuck.  Unable to lift.   Positive Cervical Flexion Rotation test to the L.  Pt with positive radiculopathy symptoms in LUE with ULTT.        Special Tests    Special Tests  Cervical    Cervical Tests  other;other2      other    Findings  Negative    Comment  Transverse Ligament test      other    Findings  Negative    Comment  Alar ligament test          Therex:  Added median nerve glide and supine chin tuck to HEP, see pt instruction for details.       Objective measurements completed on examination: See above findings.              PT Education - 11/13/17 1303    Education provided  Yes    Education Details  evaluation findings, POC, goals, initial HEP    Person(s) Educated  Patient    Methods  Explanation;Demonstration;Handout    Comprehension  Verbalized understanding;Returned demonstration       PT Short Term Goals - 11/13/17 1939      PT SHORT TERM GOAL #1   Title  Pt will be independent with initial HEP in order to indicate improved function and decreased pain.  (Target Date: 12/13/17)    Time  4    Period  Weeks    Status  New    Target Date  12/13/17      PT SHORT TERM GOAL #2   Title  Will assess NDI and write appropriate LTG.       Time  4    Period  Weeks    Status  New      PT SHORT TERM GOAL #3   Title  Will assess neck flexor endurance test and write appropriate LTG.     Time  4    Period  Weeks    Status  New      PT SHORT TERM GOAL #4   Title  Pt will improve ROM in all planes by 5 deg in order to indicate improved cervical mobility and ability to perform ADLs.     Time  4    Period  Weeks    Status  New      PT SHORT TERM GOAL #5   Title  Pt will improve L shoulder abd strength to 4/5 in order to indicate improved functional strength.     Time  4    Period  Weeks    Status  New      Additional Short Term Goals   Additional Short Term Goals  Yes      PT SHORT TERM GOAL #6   Title  Pt will report no more than 4/10 pain in neck/shoulders during  mobility in order to indicate improved function.      Time  4    Period  Weeks    Status  New        PT Long Term Goals - 11/13/17 1942      PT LONG TERM GOAL #1   Title  Pt will be independent with final HEP in order to indicate improved function and decreased pain.  (Target Date:  01/12/18)    Time  8    Period  Weeks    Status  New    Target Date  01/12/18      PT LONG TERM GOAL #2   Title  Pt will improve cervical rotation to 80 deg bilaterally with no more than 2/10 pain in order to indicate improved ability to complete ADLs.      Time  8    Period  Weeks    Status  New      PT LONG TERM GOAL #3   Title  Pt will report no more than 2/10 pain in order to indicate improved ability to carryout work duties.      Time  8    Period  Weeks    Status  New      PT LONG TERM GOAL #4   Title  Pt will report reduction in headaches by 50% in order to indicate improved ability to carryout work duties.     Time  8    Period  Weeks    Status  New             Plan - 11/13/17 1305    Clinical Impression Statement  Pt presents with increasing B cervical pain into B shoulders with headaches for past 6 months.  Note history of Graves disease and colon cancer.  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  She has adjusted work station so that clients are at eye level, but still has to keep B shoulders elevated, likely demonstrating accessory muscle compensation.  Upon evalution, note decreased cervical ROM, esp flexion, extension and rotation with pain with rotation and lateral flexion.  Also note radicular symtpoms in LUE when tested with LUE weakness/pain with testing.  Note history of CVA that also effected LUE.  Pt reports headaches daily, waking up with headaches.  Note decreased cervical and upper thoracic spinal mobility with cervical upglides and PA mobs.  However following this assessment/treatment, she reports no pain and no headache.  Pt will benefit from  skilled OP neuro in order to address deficits.      History and Personal Factors relevant to plan of care:  see above    Clinical Presentation  Evolving    Clinical Presentation due to:  see above    Clinical Decision Making  Moderate    Rehab Potential  Good    Clinical Impairments Affecting Rehab Potential  chronic positioning due to job requirement    PT Frequency  2x / week    PT Duration  8 weeks    PT Treatment/Interventions  ADLs/Self Care Home Management;Moist Heat;Traction;Ultrasound;Functional mobility training;Therapeutic activities;Therapeutic exercise;Neuromuscular re-education;Patient/family education;Manual techniques;Passive range of motion;Dry needling;Taping Grade V thoracic manipulation    PT Next Visit Plan  Check two exercises given-add cervical ROM-probably in supine initially, check neck flexor muscle endurance test (add goal), give NDI and set goal, continue cervical mobs to increase all range    PT Home Exercise Plan  initial HEP for chin tucks and medial nerve glide on 5/20    Consulted and Agree with Plan of Care  Patient       Patient will benefit from skilled therapeutic intervention in order to improve the following deficits and impairments:  Decreased mobility, Decreased range of motion, Hypomobility, Increased muscle spasms, Impaired flexibility, Postural dysfunction, Improper body mechanics, Impaired UE functional use, Pain(Will address pain through manual therapy and therex)  Visit Diagnosis: Cervicalgia  Abnormal posture  Chronic left shoulder pain  Chronic right shoulder pain     Problem List Patient Active Problem List   Diagnosis Date Noted  . Edema 11/07/2017  . Hyperlipidemia   . Cerebrovascular accident (CVA) due to embolism of cerebral artery ([place]) 09/15/2017  . History of cholecystectomy 06/27/2017  . Small intestinal bacterial overgrowth 03/23/2017  . Paresthesia 01/06/2017  . Hypokalemia 01/06/2017  . EKG, abnormal 01/06/2017  .  Constipation 11/04/2016  . Left ankle pain 10/11/2016  . Pain of right heel 10/11/2016  . Prediabetes 10/29/2015  . Tendinopathy of left rotator cuff 10/27/2015  . Essential hypertension   . Right shoulder pain 04/15/2014  . Obesity 04/15/2014  . Cervicalgia 02/11/2014  . Lateral knee pain, left 08/06/2013  . Allergic rhinoconjunctivitis 07/19/2013  . Thyroid nodule 02/12/2013  . GERD (gastroesophageal reflux disease) 02/06/2012  . Tobacco abuse 09/21/2011  . Hypothyroidism 09/21/2011  . Throat pain 09/21/2011    Cameron Sprang, PT, MPT [facility] [address] [place], [place], [address] Phone: [phone]   Fax:  [phone] 11/13/17, 7:48 PM  Name: Teresa [name] MRN: [id number] Date of Birth: 09-30-1960

## 2017-11-13 NOTE — Telephone Encounter (Signed)
Test result discussed with the patient. Mild AKI. Oral hydration encouraged.   She mentioned ear popping syndrome. She has ENT at [facility]. I recommended that she calls for follow-up. I will place referral if needed. She will let me know soon.

## 2017-11-21 ENCOUNTER — Ambulatory Visit (INDEPENDENT_AMBULATORY_CARE_PROVIDER_SITE_OTHER): Payer: BLUE CROSS/BLUE SHIELD | Admitting: *Deleted

## 2017-11-21 DIAGNOSIS — I639 Cerebral infarction, unspecified: Secondary | ICD-10-CM

## 2017-11-21 NOTE — Progress Notes (Signed)
[facility] Summary Report / Loop Recorder 

## 2017-11-22 ENCOUNTER — Encounter: Payer: Self-pay | Admitting: Family Medicine

## 2017-11-27 ENCOUNTER — Ambulatory Visit
Admission: RE | Admit: 2017-11-27 | Discharge: 2017-11-27 | Disposition: A | Discharge status: Home / Self Care | Payer: BLUE CROSS/BLUE SHIELD | Source: Ambulatory Visit | Attending: Family Medicine | Admitting: Family Medicine

## 2017-11-27 ENCOUNTER — Ambulatory Visit: Payer: BLUE CROSS/BLUE SHIELD | Admitting: Physical Therapy

## 2017-11-27 DIAGNOSIS — Z1231 Encounter for screening mammogram for malignant neoplasm of breast: Secondary | ICD-10-CM

## 2017-11-28 ENCOUNTER — Encounter: Payer: Self-pay | Admitting: Physical Therapy

## 2017-11-28 ENCOUNTER — Ambulatory Visit: Payer: BLUE CROSS/BLUE SHIELD | Attending: Neurology | Admitting: Physical Therapy

## 2017-11-28 DIAGNOSIS — M542 Cervicalgia: Secondary | ICD-10-CM | POA: Insufficient documentation

## 2017-11-28 DIAGNOSIS — G8929 Other chronic pain: Secondary | ICD-10-CM | POA: Insufficient documentation

## 2017-11-28 DIAGNOSIS — M25512 Pain in left shoulder: Secondary | ICD-10-CM | POA: Diagnosis present

## 2017-11-28 DIAGNOSIS — R293 Abnormal posture: Secondary | ICD-10-CM | POA: Insufficient documentation

## 2017-11-28 DIAGNOSIS — M25511 Pain in right shoulder: Secondary | ICD-10-CM | POA: Diagnosis present

## 2017-11-28 NOTE — Therapy (Signed)
[place], [place], [address] Phone: [phone]   Fax:  [phone]  Physical Therapy Treatment  Patient Details  Name: Teresa [name] MRN: [id number] Date of Birth: 10-01-1960 Referring Provider: [name], MD   Encounter Date: 11/28/2017  PT End of Session - 11/28/17 0850    Visit Number  2    Number of Visits  17    Date for PT Re-Evaluation  01/12/18    Authorization Type  BCBS    Authorization - Visit Number  2    Authorization - Number of Visits  30    PT Start Time  0805    PT Stop Time  9833    PT Time Calculation (min)  50 min    Activity Tolerance  Patient tolerated treatment well    Behavior During Therapy  [facility] for tasks assessed/performed       Past Medical History:  Diagnosis Date  . Allergy   . Anemia   . Cervical disc disorder with radiculopathy of cervical region 02/10/2016  . Chronic lower back pain   . Diverticulosis   . Gastritis   . GERD (gastroesophageal reflux disease)   . H/O hiatal hernia   . Hiatal hernia   . History of amputation of lesser toe of left foot ([place]) 03/11/2015  . History of colon cancer   . Hypertension   . Hyperthyroidism    hx  . Hypothyroidism   . Migraine headache    "a few times/yr" (03/05/2015)  . Neck strain 02/06/2012  . Osteomyelitis ([place])    [name] 03/05/2015  . Osteomyelitis of toe of left foot ([place]) 03/05/2015  . Palpitations 01/05/2016  . Positive TB test   . Stroke ([place])   . Tubular adenoma of colon   . Tubular adenoma of colon     Past Surgical History:  Procedure Laterality Date  . ABDOMINAL HYSTERECTOMY  2006  . AMPUTATION TOE Left 03/05/2015   Procedure: AMPUTATION LEFT 5TH  TOE;  Surgeon: Wylene Simmer, MD;  Location: [place];  Service: Orthopedics;  Laterality: Left;  . APPENDECTOMY  2006  . BREAST BIOPSY Left 11/26/2014  . [place]  . COLONOSCOPY    . GALLBLADDER SURGERY    . LOOP RECORDER INSERTION N/A  09/18/2017   Procedure: LOOP RECORDER INSERTION;  Surgeon: Evans Lance, MD;  Location: [place];  Service: Cardiovascular;  Laterality: N/A;  . TEE WITHOUT CARDIOVERSION N/A 09/18/2017   Procedure: TRANSESOPHAGEAL ECHOCARDIOGRAM (TEE);  Surgeon: Pixie Casino, MD;  [facility]: [facility] ENDOSCOPY;  Service: Cardiovascular;  Laterality: N/A;  . TOE AMPUTATION Left 03/05/2015   5th toe  . TUBAL LIGATION Bilateral 1990  . UPPER GASTROINTESTINAL ENDOSCOPY      There were no vitals filed for this visit.  Subjective Assessment - 11/28/17 0809    Subjective  slight headache this morning,  and tight shoulders. both sides and I have pain in the back of my eyes . I always wake up with a headache    Pertinent History  History of old CVA with L sided weakness.      Currently in Pain?  Yes    Pain Score  6     Pain Location  Neck    Pain Orientation  Right;Left    Pain Descriptors / Indicators  Tightness;Aching    Pain Onset  More than a month ago  [place] Adult PT Treatment/Exercise - 11/28/17 0813      Self-Care   Self-Care  Other Self-Care Comments    Other Self-Care Comments   education on TPDN precautians and aftercare      Modalities   Modalities  Moist Heat      Moist Heat Therapy   Number Minutes Moist Heat  15 Minutes    Moist Heat Location  Cervical      Manual Therapy   Manual Therapy  Soft tissue mobilization;Joint mobilization;Taping    Manual therapy comments  skilled palpation for TPDN utilized    Joint Mobilization  in prone, Lateral UPA from left and PA mobs. C-2 to C-5 grade 3    Soft tissue mobilization  bil upper trap/ levator and bil paracervical    McConnell  bil Upper trap inhibiition      Neck Exercises: Stretches   Upper Trapezius Stretch  2 reps;Left;Right    Upper Trapezius Stretch Limitations  tentative    Levator Stretch  2 reps;Left;Right    Neck Stretch  5 reps;10 seconds       Trigger Point Dry Needling -  11/28/17 1448    Consent Given?  Yes    Education Handout Provided  Yes    Muscles Treated Upper Body  Upper trapezius;Suboccipitals muscle group;Levator scapulae scalenes bil  C3/4 bil twitch    Upper Trapezius Response  Twitch reponse elicited;Palpable increased muscle length bil    SubOccipitals Response  Twitch response elicited;Palpable increased muscle length bil    Levator Scapulae Response  Twitch response elicited;Palpable increased muscle length bil           PT Education - 11/28/17 0810    Education provided  Yes    Education Details  Pt educated on TPDN and aftercare and precautians and basic neck stretches    Person(s) Educated  Patient    Methods  Explanation;Demonstration;Tactile cues;Verbal cues    Comprehension  Verbalized understanding;Returned demonstration       PT Short Term Goals - 11/13/17 1939      PT SHORT TERM GOAL #1   Title  Pt will be independent with initial HEP in order to indicate improved function and decreased pain.  (Target Date: 12/13/17)    Time  4    Period  Weeks    Status  New    Target Date  12/13/17      PT SHORT TERM GOAL #2   Title  Will assess NDI and write appropriate LTG.       Time  4    Period  Weeks    Status  New      PT SHORT TERM GOAL #3   Title  Will assess neck flexor endurance test and write appropriate LTG.     Time  4    Period  Weeks    Status  New      PT SHORT TERM GOAL #4   Title  Pt will improve ROM in all planes by 5 deg in order to indicate improved cervical mobility and ability to perform ADLs.     Time  4    Period  Weeks    Status  New      PT SHORT TERM GOAL #5   Title  Pt will improve L shoulder abd strength to 4/5 in order to indicate improved functional strength.     Time  4    Period  Weeks    Status  New  Additional Short Term Goals   Additional Short Term Goals  Yes      PT SHORT TERM GOAL #6   Title  Pt will report no more than 4/10 pain in neck/shoulders during mobility in order  to indicate improved function.      Time  4    Period  Weeks    Status  New        PT Long Term Goals - 11/13/17 1942      PT LONG TERM GOAL #1   Title  Pt will be independent with final HEP in order to indicate improved function and decreased pain.  (Target Date: 01/12/18)    Time  8    Period  Weeks    Status  New    Target Date  01/12/18      PT LONG TERM GOAL #2   Title  Pt will improve cervical rotation to 80 deg bilaterally with no more than 2/10 pain in order to indicate improved ability to complete ADLs.      Time  8    Period  Weeks    Status  New      PT LONG TERM GOAL #3   Title  Pt will report no more than 2/10 pain in order to indicate improved ability to carryout work duties.      Time  8    Period  Weeks    Status  New      PT LONG TERM GOAL #4   Title  Pt will report reduction in headaches by 50% in order to indicate improved ability to carryout work duties.     Time  8    Period  Weeks    Status  New            Plan - 11/28/17 6644    Clinical Impression Statement  Pt presents with bil neck and headache pain 6/10 . Pt educated in TPDN aftercare and precautians and neck stretches for upper trap and levator.  Pt consented to TPDN and was closely monitored throughout session.  Pt will benefit from being scheduled for 3 addtional TPDN appt and will concurrently be attending neuro rehab.  Pt tolerated fist TPDN session well.    Rehab Potential  Good    Clinical Impairments Affecting Rehab Potential  chronic positioning due to job requirement    PT Frequency  2x / week    PT Duration  8 weeks    PT Treatment/Interventions  ADLs/Self Care Home Management;Moist Heat;Traction;Ultrasound;Functional mobility training;Therapeutic activities;Therapeutic exercise;Neuromuscular re-education;Patient/family education;Manual techniques;Passive range of motion;Dry needling;Taping    PT Next Visit Plan   assess benefit of TPDNCheck two exercises given-add cervical  ROM-probably in supine initially, check neck flexor muscle endurance test (add goal), give NDI and set goal, continue cervical mobs to increase all range    PT Home Exercise Plan  Upper trap and levator stretches, TPDN education    Consulted and Agree with Plan of Care  Patient       Patient will benefit from skilled therapeutic intervention in order to improve the following deficits and impairments:  Decreased mobility, Decreased range of motion, Hypomobility, Increased muscle spasms, Impaired flexibility, Postural dysfunction, Improper body mechanics, Impaired UE functional use, Pain  Visit Diagnosis: Cervicalgia  Abnormal posture  Chronic left shoulder pain  Chronic right shoulder pain     Problem List Patient Active Problem List   Diagnosis Date Noted  . Edema 11/07/2017  . Hyperlipidemia   .  Cerebrovascular accident (CVA) due to embolism of cerebral artery ([place]) 09/15/2017  . History of cholecystectomy 06/27/2017  . Small intestinal bacterial overgrowth 03/23/2017  . Paresthesia 01/06/2017  . Hypokalemia 01/06/2017  . EKG, abnormal 01/06/2017  . Constipation 11/04/2016  . Left ankle pain 10/11/2016  . Pain of right heel 10/11/2016  . Prediabetes 10/29/2015  . Tendinopathy of left rotator cuff 10/27/2015  . Essential hypertension   . Right shoulder pain 04/15/2014  . Obesity 04/15/2014  . Cervicalgia 02/11/2014  . Lateral knee pain, left 08/06/2013  . Allergic rhinoconjunctivitis 07/19/2013  . Thyroid nodule 02/12/2013  . GERD (gastroesophageal reflux disease) 02/06/2012  . Tobacco abuse 09/21/2011  . Hypothyroidism 09/21/2011  . Throat pain 09/21/2011    Voncille Lo, PT Certified Exercise Expert for the Aging Adult  11/28/17 8:50 AM Phone: [phone] Fax: [place] [address] [place], [place], [address] Phone: [phone]   Fax:  [phone]  Name: Teresa [name] MRN:  [id number] Date of Birth: 18-Sep-1960

## 2017-11-28 NOTE — Patient Instructions (Addendum)
Trigger Point Dry Needling  . What is Trigger Point Dry Needling (DN)? o DN is a physical therapy technique used to treat muscle pain and dysfunction. Specifically, DN helps deactivate muscle trigger points (muscle knots).  o A thin filiform needle is used to penetrate the skin and stimulate the underlying trigger point. The goal is for a local twitch response (LTR) to occur and for the trigger point to relax. No medication of any kind is injected during the procedure.   . What Does Trigger Point Dry Needling Feel Like?  o The procedure feels different for each individual patient. Some patients report that they do not actually feel the needle enter the skin and overall the process is not painful. Very mild bleeding may occur. However, many patients feel a deep cramping in the muscle in which the needle was inserted. This is the local twitch response.   Marland Kitchen How Will I feel after the treatment? o Soreness is normal, and the onset of soreness may not occur for a few hours. Typically this soreness does not last longer than two days.  o Bruising is uncommon, however; ice can be used to decrease any possible bruising.  o In rare cases feeling tired or nauseous after the treatment is normal. In addition, your symptoms may get worse before they get better, this period will typically not last longer than 24 hours.   . What Can I do After My Treatment? o Increase your hydration by drinking more water for the next 24 hours. o You may place ice or heat on the areas treated that have become sore, however, do not use heat on inflamed or bruised areas. Heat often brings more relief post needling. o You can continue your regular activities, but vigorous activity is not recommended initially after the treatment for 24 hours. o DN is best combined with other physical therapy such as strengthening, stretching, and other therapies.     Voncille Lo, PT Certified Exercise Expert for the Aging Adult  11/28/17  8:18 AM Phone: [phone] Fax: [phone]

## 2017-11-29 ENCOUNTER — Ambulatory Visit: Payer: BLUE CROSS/BLUE SHIELD | Admitting: Physical Therapy

## 2017-11-29 ENCOUNTER — Encounter: Payer: Self-pay | Admitting: Physical Therapy

## 2017-11-29 DIAGNOSIS — M542 Cervicalgia: Secondary | ICD-10-CM | POA: Diagnosis not present

## 2017-11-29 DIAGNOSIS — R293 Abnormal posture: Secondary | ICD-10-CM

## 2017-11-30 NOTE — Therapy (Signed)
[facility] [address] [place], [place], [address] Phone: [phone]   Fax:  [phone]  Physical Therapy Treatment  Patient Details  Name: Tanish Sinkler MRN: [id number] Date of Birth: 08/15/1960 Referring Provider: [name], MD   Encounter Date: 11/29/2017  PT End of Session - 11/29/17 1944    Visit Number  3    Number of Visits  17    Date for PT Re-Evaluation  01/12/18    Authorization Type  BCBS    Authorization - Visit Number  3    Authorization - Number of Visits  30    PT Start Time  8099    PT Stop Time  0930    PT Time Calculation (min)  41 min    Activity Tolerance  Patient tolerated treatment well;No increased pain    Behavior During Therapy  WFL for tasks assessed/performed       Past Medical History:  Diagnosis Date  . Allergy   . Anemia   . Cervical disc disorder with radiculopathy of cervical region 02/10/2016  . Chronic lower back pain   . Diverticulosis   . Gastritis   . GERD (gastroesophageal reflux disease)   . H/O hiatal hernia   . Hiatal hernia   . History of amputation of lesser toe of left foot ([place]) 03/11/2015  . History of colon cancer   . Hypertension   . Hyperthyroidism    hx  . Hypothyroidism   . Migraine headache    "a few times/yr" (03/05/2015)  . Neck strain 02/06/2012  . Osteomyelitis ([place])    [name] 03/05/2015  . Osteomyelitis of toe of left foot ([place]) 03/05/2015  . Palpitations 01/05/2016  . Positive TB test   . Stroke ([place])   . Tubular adenoma of colon   . Tubular adenoma of colon     Past Surgical History:  Procedure Laterality Date  . ABDOMINAL HYSTERECTOMY  2006  . AMPUTATION TOE Left 03/05/2015   Procedure: AMPUTATION LEFT 5TH  TOE;  Surgeon: Wylene Simmer, MD;  Location: [facility];  Service: Orthopedics;  Laterality: Left;  . APPENDECTOMY  2006  . BREAST BIOPSY Left 11/26/2014  . [place]  . COLONOSCOPY    . GALLBLADDER SURGERY    .  LOOP RECORDER INSERTION N/A 09/18/2017   Procedure: LOOP RECORDER INSERTION;  Surgeon: Evans Lance, MD;  Location: [facility];  Service: Cardiovascular;  Laterality: N/A;  . TEE WITHOUT CARDIOVERSION N/A 09/18/2017   Procedure: TRANSESOPHAGEAL ECHOCARDIOGRAM (TEE);  Surgeon: Pixie Casino, MD;  Location: [facility] ENDOSCOPY;  Service: Cardiovascular;  Laterality: N/A;  . TOE AMPUTATION Left 03/05/2015   5th toe  . TUBAL LIGATION Bilateral 1990  . UPPER GASTROINTESTINAL ENDOSCOPY      There were no vitals filed for this visit.  Subjective Assessment - 11/29/17 1944    Subjective  Headache is better this time as she got up earlier this am to put in eye drops and then went back to bed. Her neck and shoulders are sore and tight today. Still went tape on left shoulder for inhibition.    Pertinent History  History of old CVA with L sided weakness.      Limitations  House hold activities    How long can you stand comfortably?  5 hours at work without pain    Pain Onset  More than a month ago  [place] [facility] Adult PT Treatment/Exercise - 11/29/17 1947      Exercises   Other Exercises   in hooklying concurrent with moist heat: chin tucks with 5 sec holds, scapular retraction for 5 sec holds x 10 reps, then scapular depression concurrent with cervical retraction for 5 sec holds x 10 reps. cues on form and hold times.                          Modalities   Modalities  Moist Heat      Moist Heat Therapy   Number Minutes Moist Heat  -- concurrent with ex's    Moist Heat Location  Cervical      Manual Therapy   Manual Therapy  Soft tissue mobilization;Myofascial release;Manual Traction;Neural Stretch    Soft tissue mobilization  bil upper trap/ levator and bil paracervical    Myofascial Release  bil cervical paraspinals, upper traps, scalenes and rhomboids     Manual Traction  began with suboccipital release, progressing to gentle cervical distracion for prolonged holds each. once muscle  relaxation began to be noted, added gentle overpressure for shoulder depression concurrent with cervical distraction for 30 sec holds x 4-5 each side.                           Neural Stretch  concurrent with gentle cervical distraction for decreased radicular pain on both sides with passive shoulder depression, progressing to including cervical rotation to tolerance     McConnell  present and intact from previous session           PT Short Term Goals - 11/13/17 1939      PT SHORT TERM GOAL #1   Title  Pt will be independent with initial HEP in order to indicate improved function and decreased pain.  (Target Date: 12/13/17)    Time  4    Period  Weeks    Status  New    Target Date  12/13/17      PT SHORT TERM GOAL #2   Title  Will assess NDI and write appropriate LTG.       Time  4    Period  Weeks    Status  New      PT SHORT TERM GOAL #3   Title  Will assess neck flexor endurance test and write appropriate LTG.     Time  4    Period  Weeks    Status  New      PT SHORT TERM GOAL #4   Title  Pt will improve ROM in all planes by 5 deg in order to indicate improved cervical mobility and ability to perform ADLs.     Time  4    Period  Weeks    Status  New      PT SHORT TERM GOAL #5   Title  Pt will improve L shoulder abd strength to 4/5 in order to indicate improved functional strength.     Time  4    Period  Weeks    Status  New      Additional Short Term Goals   Additional Short Term Goals  Yes      PT SHORT TERM GOAL #6   Title  Pt will report no more than 4/10 pain in neck/shoulders during mobility in order to indicate improved function.      Time  4  Period  Weeks    Status  New        PT Long Term Goals - 11/13/17 1942      PT LONG TERM GOAL #1   Title  Pt will be independent with final HEP in order to indicate improved function and decreased pain.  (Target Date: 01/12/18)    Time  8    Period  Weeks    Status  New    Target Date  01/12/18      PT  LONG TERM GOAL #2   Title  Pt will improve cervical rotation to 80 deg bilaterally with no more than 2/10 pain in order to indicate improved ability to complete ADLs.      Time  8    Period  Weeks    Status  New      PT LONG TERM GOAL #3   Title  Pt will report no more than 2/10 pain in order to indicate improved ability to carryout work duties.      Time  8    Period  Weeks    Status  New      PT LONG TERM GOAL #4   Title  Pt will report reduction in headaches by 50% in order to indicate improved ability to carryout work duties.     Time  8    Period  Weeks    Status  New            Plan - 11/29/17 1944    Clinical Impression Statement  Today's skilled session continued to address pt's headache and bil neck pain and overall cervical/upper trap tightness. Pt did report some relieft after session with use of moist hot pack with ex's after manual therapy. Will trial ultrasound at next visit as pt presents with significant muscular tightness. Pt should benefit from continued PT to progress toward goals.     Rehab Potential  Good    Clinical Impairments Affecting Rehab Potential  chronic positioning due to job requirement    PT Frequency  2x / week    PT Duration  8 weeks    PT Treatment/Interventions  ADLs/Self Care Home Management;Moist Heat;Traction;Ultrasound;Functional mobility training;Therapeutic activities;Therapeutic exercise;Neuromuscular re-education;Patient/family education;Manual techniques;Passive range of motion;Dry needling;Taping    PT Next Visit Plan  give NDI survey and set goal as indicated (sorry, forgot to give it to her on 11/29/17); add additional stretches as indicated for muscular tightness; next appt is for dry needling, trial ultrasound at next appt with neuro if still presenting tight in cervical/upper trap muscles., check neck flexor muscle endurance test (add goal), continue cervical mobs to increase all range    PT Home Exercise Plan  Upper trap and levator  stretches, TPDN education    Consulted and Agree with Plan of Care  Patient       Patient will benefit from skilled therapeutic intervention in order to improve the following deficits and impairments:  Decreased mobility, Decreased range of motion, Hypomobility, Increased muscle spasms, Impaired flexibility, Postural dysfunction, Improper body mechanics, Impaired UE functional use, Pain  Visit Diagnosis: Cervicalgia  Abnormal posture     Problem List Patient Active Problem List   Diagnosis Date Noted  . Edema 11/07/2017  . Hyperlipidemia   . Cerebrovascular accident (CVA) due to embolism of cerebral artery ([place]) 09/15/2017  . History of cholecystectomy 06/27/2017  . Small intestinal bacterial overgrowth 03/23/2017  . Paresthesia 01/06/2017  . Hypokalemia 01/06/2017  . EKG, abnormal 01/06/2017  . Constipation  11/04/2016  . Left ankle pain 10/11/2016  . Pain of right heel 10/11/2016  . Prediabetes 10/29/2015  . Tendinopathy of left rotator cuff 10/27/2015  . Essential hypertension   . Right shoulder pain 04/15/2014  . Obesity 04/15/2014  . Cervicalgia 02/11/2014  . Lateral knee pain, left 08/06/2013  . Allergic rhinoconjunctivitis 07/19/2013  . Thyroid nodule 02/12/2013  . GERD (gastroesophageal reflux disease) 02/06/2012  . Tobacco abuse 09/21/2011  . Hypothyroidism 09/21/2011  . Throat pain 09/21/2011    Willow Ora, PTA, [facility] [address] [place], [place] [address] [phone] 11/30/17, 8:35 PM   Name: Andreyah Natividad MRN: [id number] Date of Birth: 07-21-1960

## 2017-12-01 ENCOUNTER — Encounter: Payer: Self-pay | Admitting: Neurology

## 2017-12-05 ENCOUNTER — Other Ambulatory Visit: Payer: Self-pay | Admitting: Family Medicine

## 2017-12-06 ENCOUNTER — Telehealth: Payer: Self-pay | Admitting: Family Medicine

## 2017-12-06 ENCOUNTER — Ambulatory Visit: Payer: BLUE CROSS/BLUE SHIELD | Admitting: Physical Therapy

## 2017-12-06 ENCOUNTER — Encounter: Payer: Self-pay | Admitting: Physical Therapy

## 2017-12-06 DIAGNOSIS — G8929 Other chronic pain: Secondary | ICD-10-CM

## 2017-12-06 DIAGNOSIS — R293 Abnormal posture: Secondary | ICD-10-CM

## 2017-12-06 DIAGNOSIS — M542 Cervicalgia: Secondary | ICD-10-CM | POA: Diagnosis not present

## 2017-12-06 DIAGNOSIS — M25511 Pain in right shoulder: Secondary | ICD-10-CM

## 2017-12-06 DIAGNOSIS — M25512 Pain in left shoulder: Secondary | ICD-10-CM

## 2017-12-06 MED ORDER — CARVEDILOL 12.5 MG PO TABS: 12.5000 mg | ORAL_TABLET | Freq: Two times a day (BID) | ORAL | 0 refills | Status: DC

## 2017-12-06 MED ORDER — PANTOPRAZOLE SODIUM 40 MG PO TBEC: 40.0000 mg | DELAYED_RELEASE_TABLET | Freq: Every day | ORAL | 1 refills | Status: DC | PRN

## 2017-12-06 MED ORDER — CARVEDILOL 12.5 MG PO TABS: 12.5000 mg | ORAL_TABLET | Freq: Two times a day (BID) | ORAL | 1 refills | Status: DC

## 2017-12-06 NOTE — Therapy (Signed)
[place], [place], [address] Phone: [phone]   Fax:  [phone]  Physical Therapy Treatment  Patient Details  Name: Teresa [name] MRN: [id number] Date of Birth: 03-12-1961 Referring Provider: [name], MD   Encounter Date: 12/06/2017  PT End of Session - 12/06/17 1643    Visit Number  4    Number of Visits  17    Date for PT Re-Evaluation  01/12/18    Authorization Type  BCBS    PT Start Time  1018    PT Stop Time  1110    PT Time Calculation (min)  52 min    Activity Tolerance  Patient tolerated treatment well;No increased pain    Behavior During Therapy  WFL for tasks assessed/performed       Past Medical History:  Diagnosis Date  . Allergy   . Anemia   . Cervical disc disorder with radiculopathy of cervical region 02/10/2016  . Chronic lower back pain   . Diverticulosis   . Gastritis   . GERD (gastroesophageal reflux disease)   . H/O hiatal hernia   . Hiatal hernia   . History of amputation of lesser toe of left foot ([place]) 03/11/2015  . History of colon cancer   . Hypertension   . Hyperthyroidism    hx  . Hypothyroidism   . Migraine headache    "a few times/yr" (03/05/2015)  . Neck strain 02/06/2012  . Osteomyelitis ([place])    [name] 03/05/2015  . Osteomyelitis of toe of left foot ([place]) 03/05/2015  . Palpitations 01/05/2016  . Positive TB test   . Stroke ([place])   . Tubular adenoma of colon   . Tubular adenoma of colon     Past Surgical History:  Procedure Laterality Date  . ABDOMINAL HYSTERECTOMY  2006  . AMPUTATION TOE Left 03/05/2015   Procedure: AMPUTATION LEFT 5TH  TOE;  Surgeon: Wylene Simmer, MD;  Location: [facility];  Service: Orthopedics;  Laterality: Left;  . APPENDECTOMY  2006  . BREAST BIOPSY Left 11/26/2014  . [place]  . COLONOSCOPY    . GALLBLADDER SURGERY    . LOOP RECORDER INSERTION N/A 09/18/2017   Procedure: LOOP RECORDER INSERTION;  Surgeon:  Evans Lance, MD;  Location: [facility];  Service: Cardiovascular;  Laterality: N/A;  . TEE WITHOUT CARDIOVERSION N/A 09/18/2017   Procedure: TRANSESOPHAGEAL ECHOCARDIOGRAM (TEE);  Surgeon: Pixie Casino, MD;  Location: [facility] ENDOSCOPY;  Service: Cardiovascular;  Laterality: N/A;  . TOE AMPUTATION Left 03/05/2015   5th toe  . TUBAL LIGATION Bilateral 1990  . UPPER GASTROINTESTINAL ENDOSCOPY      There were no vitals filed for this visit.  Subjective Assessment - 12/06/17 1025    Subjective  Patient reports she is not having headaches. Her left upper trap is still sore. She feels like she is making progress.     How long can you stand comfortably?  5 hours at work without pain    Patient Stated Goals  "to get rid of this pain and tightness"     Currently in Pain?  Yes    Pain Score  4     Pain Location  Neck    Pain Orientation  Right;Left    Pain Descriptors / Indicators  Aching;Tightness    Pain Type  Chronic pain    Pain Onset  More than a month ago    Pain Frequency  Constant  Aggravating Factors   working    Pain Relieving Factors  rest and heat     Multiple Pain Sites  No                       [facility] Adult PT Treatment/Exercise - 12/06/17 0001      Neck Exercises: Standing   Other Standing Exercises  scap retraction 2x10 yellow       Neck Exercises: Supine   Other Supine Exercise  bilateral ER 2x10 yellow     Other Supine Exercise  horizontal abduction yellow x10       Moist Heat Therapy   Number Minutes Moist Heat  10 Minutes    Moist Heat Location  Cervical      Manual Therapy   Manual therapy comments  skilled palpation for TPDN utilized    Soft tissue mobilization  bil upper trap/ levator and bil paracervical    Manual Traction  sub occipital release and gentle manual traction        Trigger Point Dry Needling - 12/06/17 1642    Muscles Treated Upper Body  Longissimus    Upper Trapezius Response  Twitch reponse elicited     Longissimus Response  Twitch response elicited cervical paraspinals C4, C5, C6 on the left            PT Education - 12/06/17 1026    Education provided  Yes  (Pended)     Person(s) Educated  Patient  (Pended)     Methods  Explanation;Demonstration;Tactile cues;Verbal cues  (Pended)     Comprehension  Verbalized understanding;Returned demonstration;Verbal cues required;Tactile cues required  (Pended)        PT Short Term Goals - 12/06/17 1646      PT SHORT TERM GOAL #1   Title  Pt will be independent with initial HEP in order to indicate improved function and decreased pain.  (Target Date: 12/13/17)    Baseline  given updated HEP    Time  4    Period  Weeks    Status  On-going      PT SHORT TERM GOAL #2   Title  Will assess NDI and write appropriate LTG.       Time  4    Period  Weeks    Status  On-going      PT SHORT TERM GOAL #3   Title  Will assess neck flexor endurance test and write appropriate LTG.     Time  4    Status  On-going      PT SHORT TERM GOAL #4   Title  Pt will improve ROM in all planes by 5 deg in order to indicate improved cervical mobility and ability to perform ADLs.     Time  4    Period  Weeks    Status  On-going      PT SHORT TERM GOAL #5   Title  Pt will improve L shoulder abd strength to 4/5 in order to indicate improved functional strength.     Time  4    Period  Weeks    Status  On-going      PT SHORT TERM GOAL #6   Title  Pt will report no more than 4/10 pain in neck/shoulders during mobility in order to indicate improved function.      Baseline  3/10 pain today     Time  4    Period  Weeks  Status  On-going        PT Long Term Goals - 11/13/17 1942      PT LONG TERM GOAL #1   Title  Pt will be independent with final HEP in order to indicate improved function and decreased pain.  (Target Date: 01/12/18)    Time  8    Period  Weeks    Status  New    Target Date  01/12/18      PT LONG TERM GOAL #2   Title  Pt will improve  cervical rotation to 80 deg bilaterally with no more than 2/10 pain in order to indicate improved ability to complete ADLs.      Time  8    Period  Weeks    Status  New      PT LONG TERM GOAL #3   Title  Pt will report no more than 2/10 pain in order to indicate improved ability to carryout work duties.      Time  8    Period  Weeks    Status  New      PT LONG TERM GOAL #4   Title  Pt will report reduction in headaches by 50% in order to indicate improved ability to carryout work duties.     Time  8    Period  Weeks    Status  New            Plan - 12/06/17 1643    Clinical Impression Statement  Patient is making good progress. She tolerated trigger point dry needling well. She was given scapaualr and postural strengthening exercises to decrease the amount of pain she has at work. She is left handed. She was given exercises for home with a band. She would benefit from continued therapy to increase strength and endurance with use of her left upper extremity.     Clinical Presentation  Evolving    Clinical Decision Making  Moderate    Clinical Impairments Affecting Rehab Potential  chronic positioning due to job requirement    PT Frequency  2x / week    PT Duration  8 weeks    PT Treatment/Interventions  ADLs/Self Care Home Management;Moist Heat;Traction;Ultrasound;Functional mobility training;Therapeutic activities;Therapeutic exercise;Neuromuscular re-education;Patient/family education;Manual techniques;Passive range of motion;Dry needling;Taping    PT Next Visit Plan  review tolerance to strengthening; continue to empahsize posture; progress exercises as tolerated; continue with soft tissue mobilization.     PT Home Exercise Plan  Upper trap and levator stretches, TPDN education    Consulted and Agree with Plan of Care  Patient       Patient will benefit from skilled therapeutic intervention in order to improve the following deficits and impairments:  Decreased mobility,  Decreased range of motion, Hypomobility, Increased muscle spasms, Impaired flexibility, Postural dysfunction, Improper body mechanics, Impaired UE functional use, Pain  Visit Diagnosis: Cervicalgia  Abnormal posture  Chronic left shoulder pain  Chronic right shoulder pain     Problem List Patient Active Problem List   Diagnosis Date Noted  . Edema 11/07/2017  . Hyperlipidemia   . Cerebrovascular accident (CVA) due to embolism of cerebral artery ([place]) 09/15/2017  . History of cholecystectomy 06/27/2017  . Small intestinal bacterial overgrowth 03/23/2017  . Paresthesia 01/06/2017  . Hypokalemia 01/06/2017  . EKG, abnormal 01/06/2017  . Constipation 11/04/2016  . Left ankle pain 10/11/2016  . Pain of right heel 10/11/2016  . Prediabetes 10/29/2015  . Tendinopathy of left rotator cuff 10/27/2015  .  Essential hypertension   . Right shoulder pain 04/15/2014  . Obesity 04/15/2014  . Cervicalgia 02/11/2014  . Lateral knee pain, left 08/06/2013  . Allergic rhinoconjunctivitis 07/19/2013  . Thyroid nodule 02/12/2013  . GERD (gastroesophageal reflux disease) 02/06/2012  . Tobacco abuse 09/21/2011  . Hypothyroidism 09/21/2011  . Throat pain 09/21/2011    Carney Living PT DPT  12/06/2017, 4:48 PM  [facility] [address] [place], [place], [address] Phone: [phone]   Fax:  [phone]  Name: Teresa [name] MRN: [id number] Date of Birth: 09-May-1961

## 2017-12-08 ENCOUNTER — Encounter: Payer: Self-pay | Admitting: Physical Therapy

## 2017-12-08 ENCOUNTER — Ambulatory Visit: Payer: BLUE CROSS/BLUE SHIELD | Admitting: Physical Therapy

## 2017-12-08 DIAGNOSIS — G8929 Other chronic pain: Secondary | ICD-10-CM

## 2017-12-08 DIAGNOSIS — M25512 Pain in left shoulder: Secondary | ICD-10-CM

## 2017-12-08 DIAGNOSIS — R293 Abnormal posture: Secondary | ICD-10-CM

## 2017-12-08 DIAGNOSIS — M542 Cervicalgia: Secondary | ICD-10-CM

## 2017-12-08 DIAGNOSIS — M25511 Pain in right shoulder: Secondary | ICD-10-CM

## 2017-12-10 NOTE — Therapy (Signed)
[facility] [address] [place], [place], [address] Phone: [phone]   Fax:  [phone]  Physical Therapy Treatment  Patient Details  Name: Teresa [name] MRN: [id number] Date of Birth: 1960-10-22 Referring Provider: [name], MD   Encounter Date: 12/08/2017     12/08/17 1542  PT Visits / Re-Eval  Visit Number 5  Number of Visits 17  Date for PT Re-Evaluation 01/12/18  Authorization  Authorization Type BCBS  Authorization - Visit Number 5  Authorization - Number of Visits 30  PT Time Calculation  PT Start Time 1601  PT Stop Time 1622  PT Time Calculation (min) 49 min  PT - End of Session  Activity Tolerance Patient tolerated treatment well;No increased pain  Behavior During Therapy WFL for tasks assessed/performed    Past Medical History:  Diagnosis Date  . Allergy   . Anemia   . Cervical disc disorder with radiculopathy of cervical region 02/10/2016  . Chronic lower back pain   . Diverticulosis   . Gastritis   . GERD (gastroesophageal reflux disease)   . H/O hiatal hernia   . Hiatal hernia   . History of amputation of lesser toe of left foot ([place]) 03/11/2015  . History of colon cancer   . Hypertension   . Hyperthyroidism    hx  . Hypothyroidism   . Migraine headache    "a few times/yr" (03/05/2015)  . Neck strain 02/06/2012  . Osteomyelitis ([place])    [name] 03/05/2015  . Osteomyelitis of toe of left foot ([place]) 03/05/2015  . Palpitations 01/05/2016  . Positive TB test   . Stroke ([place])   . Tubular adenoma of colon   . Tubular adenoma of colon     Past Surgical History:  Procedure Laterality Date  . ABDOMINAL HYSTERECTOMY  2006  . AMPUTATION TOE Left 03/05/2015   Procedure: AMPUTATION LEFT 5TH  TOE;  Surgeon: Wylene Simmer, MD;  Location: [facility];  Service: Orthopedics;  Laterality: Left;  . APPENDECTOMY  2006  . BREAST BIOPSY Left 11/26/2014  . [place]  . COLONOSCOPY     . GALLBLADDER SURGERY    . LOOP RECORDER INSERTION N/A 09/18/2017   Procedure: LOOP RECORDER INSERTION;  Surgeon: Evans Lance, MD;  Location: [facility];  Service: Cardiovascular;  Laterality: N/A;  . TEE WITHOUT CARDIOVERSION N/A 09/18/2017   Procedure: TRANSESOPHAGEAL ECHOCARDIOGRAM (TEE);  Surgeon: Pixie Casino, MD;  Location: [facility] ENDOSCOPY;  Service: Cardiovascular;  Laterality: N/A;  . TOE AMPUTATION Left 03/05/2015   5th toe  . TUBAL LIGATION Bilateral 1990  . UPPER GASTROINTESTINAL ENDOSCOPY      There were no vitals filed for this visit.     12/08/17 1536  Symptoms/Limitations  Subjective Has a headache today for 1st time in awhile (started about 2 hours ag) along with a hot flash. Now feels like she has "fluid" in her ear. Has an appt to address this. Does feel the ex's are helping her overall (those given by here and ortho at [facility]).   Pertinent History History of old CVA with L sided weakness.    Limitations House hold activities  How long can you stand comfortably? 5 hours at work without pain  Patient Stated Goals "to get rid of this pain and tightness"   Pain Assessment  Currently in Pain? Yes  Pain Score 5  Pain Location Neck  Pain Descriptors / Indicators Aching;Tightness  Pain Type Chronic pain  Pain Onset More than a month ago  Pain Frequency Constant  Aggravating Factors  working  Pain Relieving Factors rest, heat, exercises/stretches      12/08/17 1607  Modalities  Modalities Moist Heat;Electrical Stimulation  Moist Heat Therapy  Number Minutes Moist Heat 15 Minutes (concurrent with estim)  Moist Heat Location Cervical  Electrical Stimulation  Electrical Stimulation Location bil cervical paraspinals and upper traps  Electrical Stimulation Action for decreased muscle tightness and decreased pain  Electrical Stimulation Parameters IFC concurrent with moist heat with intensity to tolerance  Electrical Stimulation Goals Pain  Manual  Therapy  Manual Therapy Soft tissue mobilization;Myofascial release;Manual Traction;Neural Stretch;Muscle Energy Technique;Taping  Manual therapy comments all manual therapy performed for decreased pain, decreased muscle tightness and improved tissue extensibility   Soft tissue mobilization bil cervical paraspinals, upper traps, rhomboids and levator   Myofascial Release bil cervical paraspinals, upper traps, scalenes and rhomboids   Manual Traction sub occipital release for 30 sec holds x 5 reps and gentle manual cervical distraction for 1 minute holds x 4-5 reps.    Muscle Energy Technique concurent with gentle cervical distraction: scapular depressoin x 10 reps, then scapular retraction x 10 reps; then concurrent with bil manual shoulder depression- chin tucks with 5 sec holds x 10 reps.   Neural Stretch concurrent with gentle cervical distraction for decreased radicular pain on both sides with passive shoulder depression, progressing to including cervical rotation to tolerance   McConnell applied today to bil upper traps for posture and inhibition        PT Short Term Goals - 12/06/17 1646      PT SHORT TERM GOAL #1   Title  Pt will be independent with initial HEP in order to indicate improved function and decreased pain.  (Target Date: 12/13/17)    Baseline  given updated HEP    Time  4    Period  Weeks    Status  On-going      PT SHORT TERM GOAL #2   Title  Will assess NDI and write appropriate LTG.       Time  4    Period  Weeks    Status  On-going      PT SHORT TERM GOAL #3   Title  Will assess neck flexor endurance test and write appropriate LTG.     Time  4    Status  On-going      PT SHORT TERM GOAL #4   Title  Pt will improve ROM in all planes by 5 deg in order to indicate improved cervical mobility and ability to perform ADLs.     Time  4    Period  Weeks    Status  On-going      PT SHORT TERM GOAL #5   Title  Pt will improve L shoulder abd strength to 4/5 in  order to indicate improved functional strength.     Time  4    Period  Weeks    Status  On-going      PT SHORT TERM GOAL #6   Title  Pt will report no more than 4/10 pain in neck/shoulders during mobility in order to indicate improved function.      Baseline  3/10 pain today     Time  4    Period  Weeks    Status  On-going        PT Long Term Goals - 11/13/17 1942      PT LONG  TERM GOAL #1   Title  Pt will be independent with final HEP in order to indicate improved function and decreased pain.  (Target Date: 01/12/18)    Time  8    Period  Weeks    Status  New    Target Date  01/12/18      PT LONG TERM GOAL #2   Title  Pt will improve cervical rotation to 80 deg bilaterally with no more than 2/10 pain in order to indicate improved ability to complete ADLs.      Time  8    Period  Weeks    Status  New      PT LONG TERM GOAL #3   Title  Pt will report no more than 2/10 pain in order to indicate improved ability to carryout work duties.      Time  8    Period  Weeks    Status  New      PT LONG TERM GOAL #4   Title  Pt will report reduction in headaches by 50% in order to indicate improved ability to carryout work duties.     Time  8    Period  Weeks    Status  New         12/08/17 1542  Plan  Clinical Impression Statement Pt was able to recall all ex's given to HEP to date. Today pt presented with increased tightness/pain. Addressed this through manual therapy and modalities (use of estim approved by primary PT who will add it to the plan of care). Pt reported decreased pain/tightness to 4/10 after session. Pt is progressing toward goals and should benefit from continued PT to progress toward unmet goals.   Pt will benefit from skilled therapeutic intervention in order to improve on the following deficits Decreased mobility;Decreased range of motion;Hypomobility;Increased muscle spasms;Impaired flexibility;Postural dysfunction;Improper body mechanics;Impaired UE functional  use;Pain  Clinical Impairments Affecting Rehab Potential chronic positioning due to job requirement  PT Frequency 2x / week  PT Duration 8 weeks  PT Treatment/Interventions ADLs/Self Care Home Management;Moist Heat;Traction;Ultrasound;Functional mobility training;Therapeutic activities;Therapeutic exercise;Neuromuscular re-education;Patient/family education;Manual techniques;Passive range of motion;Dry needling;Taping  PT Next Visit Plan review tolerance to strengthening; continue to empahsize posture; progress exercises as tolerated; continue with soft tissue mobilization.   PT Home Exercise Plan Upper trap and levator stretches, TPDN education  Consulted and Agree with Plan of Care Patient       Patient will benefit from skilled therapeutic intervention in order to improve the following deficits and impairments:  Decreased mobility, Decreased range of motion, Hypomobility, Increased muscle spasms, Impaired flexibility, Postural dysfunction, Improper body mechanics, Impaired UE functional use, Pain  Visit Diagnosis: Cervicalgia  Abnormal posture  Chronic left shoulder pain  Chronic right shoulder pain     Problem List Patient Active Problem List   Diagnosis Date Noted  . Edema 11/07/2017  . Hyperlipidemia   . Cerebrovascular accident (CVA) due to embolism of cerebral artery ([place]) 09/15/2017  . History of cholecystectomy 06/27/2017  . Small intestinal bacterial overgrowth 03/23/2017  . Paresthesia 01/06/2017  . Hypokalemia 01/06/2017  . EKG, abnormal 01/06/2017  . Constipation 11/04/2016  . Left ankle pain 10/11/2016  . Pain of right heel 10/11/2016  . Prediabetes 10/29/2015  . Tendinopathy of left rotator cuff 10/27/2015  . Essential hypertension   . Right shoulder pain 04/15/2014  . Obesity 04/15/2014  . Cervicalgia 02/11/2014  . Lateral knee pain, left 08/06/2013  . Allergic rhinoconjunctivitis 07/19/2013  . Thyroid nodule  02/12/2013  . GERD (gastroesophageal  reflux disease) 02/06/2012  . Tobacco abuse 09/21/2011  . Hypothyroidism 09/21/2011  . Throat pain 09/21/2011    Willow Ora, PTA, [facility] [address] [place], [place] [address] [phone] 12/10/17, 11:05 PM   Name: Teresa [name] MRN: [id number] Date of Birth: February 01, 1961

## 2017-12-12 NOTE — Progress Notes (Signed)
Subjective:    Patient ID: Teresa [name], female    DOB: [birth year], 57 y.o.   MRN: [id number]   CC: ear pain, weight gain, blood pressure  HPI: Ear pain Patient reports left ear pain that has steadily worsened since Friday.  States it has been going on and off for years now but most recent flare started on Friday.  Says her eyes are also always red.  Says she has an ENT doctor in [place] that told her it is related to her sinuses, but he unfortunately left his practice to move back to [place].  Patient states at last appointment physician informed her that she would be part of a study for her sinuses.  Last note from [facility] in the EMR on 01/16/17, at which time patient was thought to have referred left ear pain from TMJ and told to follow up with dentist.  Patient would like to be referred back to [facility] for ENT.  Weight gain Patient reports today 5 pound weight increase in the past month.  Says she does not know why she is gaining weight as she eats very healthy.  Ports a diet of normally a boiled egg and banana in the morning, a croissant with either chicken salad or tuna salad at lunch, and grilled foods at night including grilled chicken or salmon.  Does eat [place] fries with dinner, but also has steamed vegetables.  Has heard about phentermine and is wondering if this will help with weight loss.  Patient is wondering also if menopause in the fact that she recently quit smoking related to weight loss.  Patient does not report any exercise, says she stretches daily or twice a day but no aerobic exercise.  Hypertension: - Medications: spironolactone 25mg  daily, coreg 12.5mg  bid, hydralazine 50mg  tid  - Compliance: good  - Checking BP at home: stays high at home, systolics in the 867Y -Patient endorses shortness of breath and headache, worse yesterday evening.  Denies chest pain or lower extremity edema, or symptoms of hypotension. - Diet: breakfast = boiled egg, banana,  lunch = croissant with chicken/tuna salad, dinner = grilled foods (chicken/salmon), french fries, steamed vegetables  - Exercise: none. Stretches bid. Is planning on starting to walk but it is too hot outside right now    Smoking status reviewed. Quit smoking 3 months ago    Objective:  BP (!) 210/82   Pulse 68   Temp 98.3 F (36.8 C) (Oral)   Ht 5\' 5"  (1.651 m)   Wt 201 lb 9.6 oz (91.4 kg)   SpO2 96%   BMI 33.55 kg/m  Vitals and nursing note reviewed  General: well nourished, in no acute distress HEENT: normocephalic, PERRL, TM's visualized bilaterally, no scleral icterus or conjunctival pallor, no nasal discharge, moist mucous membranes, good dentition without erythema or discharge noted in posterior oropharynx Neck: supple, non-tender, without lymphadenopathy Cardiac: RRR, clear S1 and S2, no murmurs, rubs, or gallops Respiratory: clear to auscultation bilaterally, no increased work of breathing Abdomen: soft, nontender, nondistended, no masses or organomegaly. Bowel sounds present Extremities: no edema or cyanosis. Warm, well perfused. 2+ radial and PT pulses bilaterally Skin: warm and dry, no rashes noted Neuro: alert and oriented, no focal deficits   Assessment & Plan:    Essential hypertension Poorly controlled.  Blood pressure remains elevated with home blood pressure readings showing systolics in the 195K.  Patient was advised at last appointment in May to increase spironolactone to 2 tablets a day, but  has only been taking 1 tablet.  Will increase Spironolactone today to 50 mg once a day, prescription sent to desired pharmacy. -Increase spironolactone to 50 mg once a day -Continue Coreg 12.5 mg twice daily, hydralazine 50 mg 3 times daily -Follow-up with PCP in 1 week, sooner if symptoms persist or worsen -2 new home blood pressure checks -Discussed patient with Dr. Gwendlyn Deutscher  Obesity Patient reports increased weight weight gain of 5 pounds in past month.  Patient is  improving diet, but still does not have any exercise.  Instructed patient to add daily aerobic exercise and need for caloric deficit.  Offered referral to Dr. Leafy Ro for weight management, patient refused at this time.  Do not recommend starting weight loss medications at this time. -Follow-up as needed with PCP -Continue at least 30 minutes of aerobic exercise daily, increase as tolerated -Continue eating healthy diet, with largest portion being vegetables  Discussed patient with Dr. Raynelle Bring  Ear pain, left Patient reports continued ear pain.  Patient has been seen by ENT in the past, unclear what recommendations were at that time as patient reports she was supposed to be in a study regarding her sinuses however last ENT note and EMR shows that they were referring to dentist for TMJ.  As patient has already established care at [facility], should not need referral at this time.  Informed patient that she should call to schedule a follow-up appointment.  Instructed patient to schedule appointment with previous physicians partners as he has now left the practice.  Benign ear exam today, tympanic membranes visualized bilaterally and with good cone of light.  No edema present. -Follow-up at [facility] as needed -Follow-up with [facility], patient aware in verbalized that she will make appointment  Discussed patient with Dr. Raynelle Bring    Return in about 1 week (around 12/20/2017) for follow up with Dr. Gwendlyn Deutscher.   Caroline More, DO, PGY-1

## 2017-12-13 ENCOUNTER — Ambulatory Visit: Payer: BLUE CROSS/BLUE SHIELD | Admitting: Rehabilitative and Restorative Service Providers"

## 2017-12-13 ENCOUNTER — Ambulatory Visit (INDEPENDENT_AMBULATORY_CARE_PROVIDER_SITE_OTHER): Payer: BLUE CROSS/BLUE SHIELD | Admitting: Family Medicine

## 2017-12-13 ENCOUNTER — Encounter: Payer: Self-pay | Admitting: Physical Therapy

## 2017-12-13 ENCOUNTER — Ambulatory Visit: Payer: BLUE CROSS/BLUE SHIELD | Admitting: Physical Therapy

## 2017-12-13 ENCOUNTER — Encounter: Payer: Self-pay | Admitting: Family Medicine

## 2017-12-13 DIAGNOSIS — E669 Obesity, unspecified: Secondary | ICD-10-CM

## 2017-12-13 DIAGNOSIS — I10 Essential (primary) hypertension: Secondary | ICD-10-CM | POA: Diagnosis not present

## 2017-12-13 DIAGNOSIS — M25511 Pain in right shoulder: Secondary | ICD-10-CM

## 2017-12-13 DIAGNOSIS — M542 Cervicalgia: Secondary | ICD-10-CM | POA: Diagnosis not present

## 2017-12-13 DIAGNOSIS — M25512 Pain in left shoulder: Secondary | ICD-10-CM

## 2017-12-13 DIAGNOSIS — H9202 Otalgia, left ear: Secondary | ICD-10-CM | POA: Diagnosis not present

## 2017-12-13 DIAGNOSIS — G8929 Other chronic pain: Secondary | ICD-10-CM

## 2017-12-13 DIAGNOSIS — R293 Abnormal posture: Secondary | ICD-10-CM

## 2017-12-13 LAB — CUP PACEART REMOTE DEVICE CHECK
Date Time Interrogation Session: 20190527213850
Implantable Pulse Generator Implant Date: 20190325

## 2017-12-13 MED ORDER — SPIRONOLACTONE 50 MG PO TABS: 50.0000 mg | ORAL_TABLET | Freq: Every day | ORAL | 0 refills | Status: DC

## 2017-12-13 NOTE — Assessment & Plan Note (Addendum)
Patient reports continued ear pain.  Patient has been seen by ENT in the past, unclear what recommendations were at that time as patient reports she was supposed to be in a study regarding her sinuses however last ENT note and EMR shows that they were referring to dentist for TMJ.  As patient has already established care at [facility], should not need referral at this time.  Informed patient that she should call to schedule a follow-up appointment.  Instructed patient to schedule appointment with previous physicians partners as he has now left the practice.  Benign ear exam today, tympanic membranes visualized bilaterally and with good cone of light.  No edema present. -Follow-up at [facility] as needed -Follow-up with [facility], patient aware in verbalized that she will make appointment  Discussed patient with Dr. Raynelle Bring

## 2017-12-13 NOTE — Assessment & Plan Note (Addendum)
Patient reports increased weight weight gain of 5 pounds in past month.  Patient is improving diet, but still does not have any exercise.  Instructed patient to add daily aerobic exercise and need for caloric deficit.  Offered referral to Dr. Leafy Ro for weight management, patient refused at this time.  Do not recommend starting weight loss medications at this time. -Follow-up as needed with PCP -Continue at least 30 minutes of aerobic exercise daily, increase as tolerated -Continue eating healthy diet, with largest portion being vegetables  Discussed patient with Dr. Raynelle Bring

## 2017-12-13 NOTE — Assessment & Plan Note (Signed)
Poorly controlled.  Blood pressure remains elevated with home blood pressure readings showing systolics in the 335O.  Patient was advised at last appointment in May to increase spironolactone to 2 tablets a day, but has only been taking 1 tablet.  Will increase Spironolactone today to 50 mg once a day, prescription sent to desired pharmacy. -Increase spironolactone to 50 mg once a day -Continue Coreg 12.5 mg twice daily, hydralazine 50 mg 3 times daily -Follow-up with PCP in 1 week, sooner if symptoms persist or worsen -2 new home blood pressure checks -Discussed patient with Dr. Gwendlyn Deutscher

## 2017-12-13 NOTE — Patient Instructions (Signed)
It was a pleasure seeing you today.   Today we discussed your blood pressure, your weight gain, and ear pain  For your blood presure: I have increased your spironolactone to 50mg  daily. I want you to continue taking your coreg and hydralazine as you have been. Please follow up in 1 week with Dr. Gwendlyn Deutscher  For your weight gain: I would encourage you to do at least 30 min of aerobic exercise (ex: walking, jogging, runnging) daily. Also continue to monitor portion control. I would be happy to refer you to a bariatric doctor who works with patients to help with weight management.   For your ear pain: Please contact [facility]. Since they have a referral in the system I will not need to send a new one. You just need to schedule with one of your Doctor's partners since he is leaving. If you need a new referral please call the clinic and I would be happy to send one in.   Please follow up in 1 week or sooner if symptoms persist or worsen. Please call the clinic immediately if you have any concerns.   Our clinic's number is [phone]. Please call with questions or concerns.   Please go to the emergency room if you have worsening headaches, vision changes, dizziness, or weakness.   Thank you,  Caroline More, DO

## 2017-12-13 NOTE — Therapy (Signed)
[place], [place], [address] Phone: [phone]   Fax:  [phone]  Physical Therapy Treatment  Patient Details  Name: Teresa [name] MRN: [id number] Date of Birth: 27-Jan-1961 Referring Provider: [name], MD   Encounter Date: 12/13/2017  PT End of Session - 12/13/17 1627    Visit Number  6    Number of Visits  17    Date for PT Re-Evaluation  01/12/18    Authorization Type  BCBS    Authorization - Visit Number  6    Authorization - Number of Visits  30    PT Start Time  [phone] Patient 7 minutes late. Only 2 units billed 2nd to dry needling     PT Stop Time  0940    PT Time Calculation (min)  48 min    Activity Tolerance  Patient tolerated treatment well;No increased pain    Behavior During Therapy  WFL for tasks assessed/performed       Past Medical History:  Diagnosis Date  . Allergy   . Anemia   . Cervical disc disorder with radiculopathy of cervical region 02/10/2016  . Chronic lower back pain   . Diverticulosis   . Gastritis   . GERD (gastroesophageal reflux disease)   . H/O hiatal hernia   . Hiatal hernia   . History of amputation of lesser toe of left foot ([place]) 03/11/2015  . History of colon cancer   . Hypertension   . Hyperthyroidism    hx  . Hypothyroidism   . Migraine headache    "a few times/yr" (03/05/2015)  . Neck strain 02/06/2012  . Osteomyelitis ([place])    [name] 03/05/2015  . Osteomyelitis of toe of left foot ([place]) 03/05/2015  . Palpitations 01/05/2016  . Positive TB test   . Stroke ([place])   . Tubular adenoma of colon   . Tubular adenoma of colon     Past Surgical History:  Procedure Laterality Date  . ABDOMINAL HYSTERECTOMY  2006  . AMPUTATION TOE Left 03/05/2015   Procedure: AMPUTATION LEFT 5TH  TOE;  Surgeon: Wylene Simmer, MD;  Location: [facility];  Service: Orthopedics;  Laterality: Left;  . APPENDECTOMY  2006  . BREAST BIOPSY Left 11/26/2014  . [place]  . COLONOSCOPY    . GALLBLADDER SURGERY    . LOOP RECORDER INSERTION N/A 09/18/2017   Procedure: LOOP RECORDER INSERTION;  Surgeon: Evans Lance, MD;  Location: [facility];  Service: Cardiovascular;  Laterality: N/A;  . TEE WITHOUT CARDIOVERSION N/A 09/18/2017   Procedure: TRANSESOPHAGEAL ECHOCARDIOGRAM (TEE);  Surgeon: Pixie Casino, MD;  Location: [facility] ENDOSCOPY;  Service: Cardiovascular;  Laterality: N/A;  . TOE AMPUTATION Left 03/05/2015   5th toe  . TUBAL LIGATION Bilateral 1990  . UPPER GASTROINTESTINAL ENDOSCOPY      There were no vitals filed for this visit.  Subjective Assessment - 12/13/17 0854    Subjective  Patient reports the exercises and stretch have been helping. She had a long day of work yesterday which has caused some pain.     How long can you stand comfortably?  5 hours at work without pain    Patient Stated Goals  "to get rid of this pain and tightness"     Currently in Pain?  Yes    Pain Score  4     Pain Location  Neck    Pain Orientation  Right;Left    Pain  Descriptors / Indicators  Aching;Tightness    Pain Type  Chronic pain    Pain Radiating Towards  into the shoulders     Pain Onset  More than a month ago    Pain Frequency  Constant    Aggravating Factors   working     Pain Relieving Factors  rest, heat, exercises andstretches     Multiple Pain Sites  No                       [facility] Adult PT Treatment/Exercise - 12/13/17 0001      Manual Therapy   Manual therapy comments  skilled palpation for TPDN utilized    Soft tissue mobilization  bil upper trap/ levator and bil paracervical; IASTYM to bilateral upper traps and cervical paraspinals     Manual Traction  sub occipital release and gentle manual traction              PT Education - 12/13/17 0856    Education provided  Yes       PT Short Term Goals - 12/06/17 1646      PT SHORT TERM GOAL #1   Title  Pt will be independent with initial HEP in order to  indicate improved function and decreased pain.  (Target Date: 12/13/17)    Baseline  given updated HEP    Time  4    Period  Weeks    Status  On-going      PT SHORT TERM GOAL #2   Title  Will assess NDI and write appropriate LTG.       Time  4    Period  Weeks    Status  On-going      PT SHORT TERM GOAL #3   Title  Will assess neck flexor endurance test and write appropriate LTG.     Time  4    Status  On-going      PT SHORT TERM GOAL #4   Title  Pt will improve ROM in all planes by 5 deg in order to indicate improved cervical mobility and ability to perform ADLs.     Time  4    Period  Weeks    Status  On-going      PT SHORT TERM GOAL #5   Title  Pt will improve L shoulder abd strength to 4/5 in order to indicate improved functional strength.     Time  4    Period  Weeks    Status  On-going      PT SHORT TERM GOAL #6   Title  Pt will report no more than 4/10 pain in neck/shoulders during mobility in order to indicate improved function.      Baseline  3/10 pain today     Time  4    Period  Weeks    Status  On-going        PT Long Term Goals - 11/13/17 1942      PT LONG TERM GOAL #1   Title  Pt will be independent with final HEP in order to indicate improved function and decreased pain.  (Target Date: 01/12/18)    Time  8    Period  Weeks    Status  New    Target Date  01/12/18      PT LONG TERM GOAL #2   Title  Pt will improve cervical rotation to 80 deg bilaterally with no more than 2/10 pain in  order to indicate improved ability to complete ADLs.      Time  8    Period  Weeks    Status  New      PT LONG TERM GOAL #3   Title  Pt will report no more than 2/10 pain in order to indicate improved ability to carryout work duties.      Time  8    Period  Weeks    Status  New      PT LONG TERM GOAL #4   Title  Pt will report reduction in headaches by 50% in order to indicate improved ability to carryout work duties.     Time  8    Period  Weeks    Status  New             Plan - 12/13/17 1628    Clinical Impression Statement  Good tiwitch respose to dry needling but noit as much as last visit. Bilateral upper traps and parapsinals needled this time. She reported last time she flet like her left was much looser then her right after needling. She has been working on her exercises. Her symptoms are correlated to how much work she does. Patient will continue to work on her exercises at home.     Clinical Presentation  Evolving    Clinical Decision Making  Moderate    Clinical Impairments Affecting Rehab Potential  chronic positioning due to job requirement    PT Frequency  2x / week    PT Duration  8 weeks    PT Treatment/Interventions  ADLs/Self Care Home Management;Moist Heat;Traction;Ultrasound;Functional mobility training;Therapeutic activities;Therapeutic exercise;Neuromuscular re-education;Patient/family education;Manual techniques;Passive range of motion;Dry needling;Taping    PT Next Visit Plan  review tolerance to strengthening; continue to empahsize posture; progress exercises as tolerated; continue with soft tissue mobilization.     PT Home Exercise Plan  Upper trap and levator stretches, TPDN education    Consulted and Agree with Plan of Care  Patient       Patient will benefit from skilled therapeutic intervention in order to improve the following deficits and impairments:  Decreased mobility, Decreased range of motion, Hypomobility, Increased muscle spasms, Impaired flexibility, Postural dysfunction, Improper body mechanics, Impaired UE functional use, Pain  Visit Diagnosis: Cervicalgia  Abnormal posture  Chronic left shoulder pain  Chronic right shoulder pain     Problem List Patient Active Problem List   Diagnosis Date Noted  . Edema 11/07/2017  . Hyperlipidemia   . Cerebrovascular accident (CVA) due to embolism of cerebral artery ([place]) 09/15/2017  . History of cholecystectomy 06/27/2017  . Small intestinal bacterial  overgrowth 03/23/2017  . Paresthesia 01/06/2017  . Hypokalemia 01/06/2017  . EKG, abnormal 01/06/2017  . Constipation 11/04/2016  . Left ankle pain 10/11/2016  . Pain of right heel 10/11/2016  . Prediabetes 10/29/2015  . Tendinopathy of left rotator cuff 10/27/2015  . Essential hypertension   . Right shoulder pain 04/15/2014  . Obesity 04/15/2014  . Cervicalgia 02/11/2014  . Lateral knee pain, left 08/06/2013  . Allergic rhinoconjunctivitis 07/19/2013  . Thyroid nodule 02/12/2013  . GERD (gastroesophageal reflux disease) 02/06/2012  . Tobacco abuse 09/21/2011  . Hypothyroidism 09/21/2011  . Throat pain 09/21/2011    Carney Living PT DPT  12/13/2017, 4:36 PM  [facility] [address] [place], [place], [address] Phone: [phone]   Fax:  [phone]  Name: Teresa [name] MRN: [id number] Date of Birth: 1961/05/19

## 2017-12-15 ENCOUNTER — Encounter: Payer: Self-pay | Admitting: Family Medicine

## 2017-12-15 ENCOUNTER — Ambulatory Visit: Payer: BLUE CROSS/BLUE SHIELD | Admitting: Rehabilitative and Restorative Service Providers"

## 2017-12-20 ENCOUNTER — Ambulatory Visit: Payer: BLUE CROSS/BLUE SHIELD | Admitting: Physical Therapy

## 2017-12-21 ENCOUNTER — Encounter: Payer: Self-pay | Admitting: Family Medicine

## 2017-12-22 ENCOUNTER — Other Ambulatory Visit: Payer: Self-pay

## 2017-12-22 ENCOUNTER — Encounter: Payer: Self-pay | Admitting: Family Medicine

## 2017-12-22 ENCOUNTER — Ambulatory Visit (INDEPENDENT_AMBULATORY_CARE_PROVIDER_SITE_OTHER): Payer: BLUE CROSS/BLUE SHIELD | Admitting: Family Medicine

## 2017-12-22 ENCOUNTER — Ambulatory Visit: Payer: BLUE CROSS/BLUE SHIELD | Admitting: Rehabilitation

## 2017-12-22 ENCOUNTER — Telehealth: Payer: Self-pay | Admitting: Family Medicine

## 2017-12-22 ENCOUNTER — Encounter: Payer: Self-pay | Admitting: Rehabilitation

## 2017-12-22 VITALS — BP 175/90 | HR 71 | Temp 98.3°F | Ht 65.0 in | Wt 200.0 lb

## 2017-12-22 DIAGNOSIS — H9202 Otalgia, left ear: Secondary | ICD-10-CM | POA: Diagnosis not present

## 2017-12-22 DIAGNOSIS — E785 Hyperlipidemia, unspecified: Secondary | ICD-10-CM

## 2017-12-22 DIAGNOSIS — M542 Cervicalgia: Secondary | ICD-10-CM

## 2017-12-22 DIAGNOSIS — I10 Essential (primary) hypertension: Secondary | ICD-10-CM | POA: Diagnosis not present

## 2017-12-22 DIAGNOSIS — R293 Abnormal posture: Secondary | ICD-10-CM

## 2017-12-22 MED ORDER — OFLOXACIN 0.3 % OT SOLN: 5.0000 [drp] | Freq: Every day | OTIC | 0 refills | Status: AC

## 2017-12-22 MED ORDER — HYDRALAZINE HCL 50 MG PO TABS: 50.0000 mg | ORAL_TABLET | Freq: Four times a day (QID) | ORAL | 1 refills | Status: DC

## 2017-12-22 MED ORDER — EZETIMIBE 10 MG PO TABS: 10.0000 mg | ORAL_TABLET | Freq: Every day | ORAL | 1 refills | Status: DC

## 2017-12-22 NOTE — Patient Instructions (Addendum)
Take Hydralazine 100 mg BID, continue aldactone 50 mg daily and Coreg of 12.5 mg BID. Stop Pravachol and I will start a new Cholesterol medicine.  Please see me back in 2weeks. Check BP regularly. I will give Antibiotic for your ears. See your ENT please.   Earache, Adult An earache, or ear pain, can be caused by many things, including:  An infection.  Ear wax buildup.  Ear pressure.  Something in the ear that should not be there (foreign body).  A sore throat.  Tooth problems.  Jaw problems.  Treatment of the earache will depend on the cause. If the cause is not clear or cannot be determined, you may need to watch your symptoms until your earache goes away or until a cause is found. Follow these instructions at home: Pay attention to any changes in your symptoms. Take these actions to help with your pain:  Take or apply over-the-counter and prescription medicines only as told by your health care provider.  If you were prescribed an antibiotic medicine, use it as told by your health care provider. Do not stop using the antibiotic even if you start to feel better.  Do not put anything in your ear other than medicine that is prescribed by your health care provider.  If directed, apply heat to the affected area as often as told by your health care provider. Use the heat source that your health care provider recommends, such as a moist heat pack or a heating pad. ? Place a towel between your skin and the heat source. ? Leave the heat on for 20-30 minutes. ? Remove the heat if your skin turns bright red. This is especially important if you are unable to feel pain, heat, or cold. You may have a greater risk of getting burned.  If directed, put ice on the ear: ? Put ice in a plastic bag. ? Place a towel between your skin and the bag. ? Leave the ice on for 20 minutes, 2-3 times a day.  Try resting in an upright position instead of lying down. This may help to reduce pressure in  your ear and relieve pain.  Chew gum if it helps to relieve your ear pain.  Treat any allergies as told by your health care provider.  Keep all follow-up visits as told by your health care provider. This is important.  Contact a health care provider if:  Your pain does not improve within 2 days.  Your earache gets worse.  You have new symptoms.  You have a fever. Get help right away if:  You have a severe headache.  You have a stiff neck.  You have trouble swallowing.  You have redness or swelling behind your ear.  You have fluid or blood coming from your ear.  You have hearing loss.  You feel dizzy. This information is not intended to replace advice given to you by your health care provider. Make sure you discuss any questions you have with your health care provider. Document Released: 01/29/2004 Document Revised: 02/09/2016 Document Reviewed: 12/07/2015 Elsevier Interactive Patient Education  [employer].

## 2017-12-22 NOTE — Progress Notes (Signed)
Subjective:     Patient ID: Teresa [name], female   DOB: 01/26/1961, 57 y.o.   MRN: [id number]  HPI HTN/HLD:Compliant with her meds. Here for follow-up. BP still running high at home. She discontinued Pravachol due to muscle aches. She will not go back on it. Ear pain: left ear pain persists. She had tried everything without improvement. ENT had not helped much.  Current Outpatient Medications on File Prior to Visit  Medication Sig Dispense Refill  . aspirin 81 MG chewable tablet Chew 1 tablet (81 mg total) by mouth daily. 30 tablet 0  . carvedilol (COREG) 12.5 MG tablet Take 1 tablet (12.5 mg total) by mouth 2 (two) times daily with a meal. 60 tablet 1  . cycloSPORINE (RESTASIS) 0.05 % ophthalmic emulsion Place 1 drop into both eyes 2 (two) times daily.     . hydrALAZINE (APRESOLINE) 50 MG tablet Take 1 tablet (50 mg total) by mouth every 8 (eight) hours. 90 tablet 1  . levothyroxine (SYNTHROID, LEVOTHROID) 112 MCG tablet TAKE ONE TABLET BY MOUTH ONCE DAILY BEFORE BREAKFAST 90 tablet 0  . pravastatin (PRAVACHOL) 40 MG tablet Take 1 tablet (40 mg total) by mouth daily. 90 tablet 3  . spironolactone (ALDACTONE) 50 MG tablet Take 1 tablet (50 mg total) by mouth daily. 30 tablet 0  . mometasone (NASONEX) 50 MCG/ACT nasal spray Place 2 sprays into the nose daily. (Patient not taking: Reported on 11/13/2017) 17 g 12  . naphazoline-pheniramine (VISINE-A) 0.025-0.3 % ophthalmic solution Place 1 drop into both eyes 2 (two) times daily.    . pantoprazole (PROTONIX) 40 MG tablet Take 1 tablet (40 mg total) by mouth daily as needed. Prolong use can impair your kidney 30 tablet 1  . Polyethylene Glycol 400 (BLINK TEARS OP) Place 1 drop into both eyes 4 (four) times daily as needed (dry eyes).     Current Facility-Administered Medications on File Prior to Visit  Medication Dose Route Frequency Provider Last Rate Last Dose  . olopatadine (PATANOL) 0.1 % ophthalmic solution 1 drop  1 drop Both Eyes BID  McDiarmid, Blane Ohara, MD       Past Medical History:  Diagnosis Date  . Allergy   . Anemia   . Cervical disc disorder with radiculopathy of cervical region 02/10/2016  . Chronic lower back pain   . Diverticulosis   . EKG, abnormal 01/06/2017  . Gastritis   . GERD (gastroesophageal reflux disease)   . H/O hiatal hernia   . Hiatal hernia   . History of amputation of lesser toe of left foot ([place]) 03/11/2015  . History of colon cancer   . Hypertension   . Hyperthyroidism    hx  . Hypothyroidism   . Migraine headache    "a few times/yr" (03/05/2015)  . Neck strain 02/06/2012  . Osteomyelitis ([place])    [name] 03/05/2015  . Osteomyelitis of toe of left foot ([place]) 03/05/2015  . Palpitations 01/05/2016  . Positive TB test   . Stroke ([place])   . Tubular adenoma of colon   . Tubular adenoma of colon      Review of Systems  Constitutional: Negative.   HENT: Positive for ear pain. Negative for ear discharge and hearing loss.   Respiratory: Negative.   Cardiovascular: Negative.   Gastrointestinal: Negative.   Neurological: Negative.        Objective:   Physical Exam  Constitutional: She is oriented to person, place, and time. She appears well-developed. No distress.  HENT:  Right  Ear: Tympanic membrane, external ear and ear canal normal.  Ears:  Cardiovascular: Normal rate, regular rhythm and normal heart sounds.  No murmur heard. Pulmonary/Chest: Effort normal and breath sounds normal. No stridor. No respiratory distress.  Abdominal: Soft. Bowel sounds are normal. She exhibits no distension. There is no tenderness. No hernia.  Musculoskeletal: Normal range of motion. She exhibits no edema.  Neurological: She is alert and oriented to person, place, and time. No cranial nerve deficit.  Nursing note and vitals reviewed.      Assessment:     HTN HLD Left otalgia    Plan:     Check problem list.

## 2017-12-22 NOTE — Assessment & Plan Note (Signed)
Uncontrolled despite compliance. Aldactone was increased to 50 mg. Bmet checked today. Hydralazine increased from 150 mg qd to 200 qd. Initially she mentioned she does not like to take meds at a TID or QID frequency and I recommended she takes Hydrazine 100 BID to reduce frequency. However, per pharmacy alert, the maximum single dose should not exceed 75mg  per dose.  I called and left a detailed message on her phone to take Hydralazine 50 mg QID instead due to this alert. I will call her to follow-up on it. F/U as planned for BP monitoring. Continue Coreg at 12.5 mg BID.

## 2017-12-22 NOTE — Telephone Encounter (Signed)
I spoke directly with the patient and advised her to take hydralazine 50 mg QID instead. She verbalized understanding and agreed with the plan.

## 2017-12-22 NOTE — Telephone Encounter (Signed)
Hydralazine increased from 150 mg qd to 200 qd. Initially she mentioned she does not like to take meds at a TID or QID frequency and I recommended she takes Hydrazine 100 BID to reduce frequency. However, per pharmacy alert, the maximum single dose should not exceed 75mg  per dose.  I called and left a detailed message on her phone to take Hydralazine 50 mg QID instead due to this alert. I will call her to follow-up on it.

## 2017-12-22 NOTE — Therapy (Signed)
[facility] [address] [place], [place], [address] Phone: [phone]   Fax:  [phone]  Physical Therapy Treatment  Patient Details  Name: Teresa [name] MRN: [id number] Date of Birth: [birth year] Referring Provider: [name], MD   Encounter Date: 12/22/2017  PT End of Session - 12/22/17 1036    Visit Number  7    Number of Visits  17    Date for PT Re-Evaluation  01/12/18    Authorization Type  [employer]    Authorization - Visit Number  7    Authorization - Number of Visits  30    PT Start Time  0848    PT Stop Time  0930    PT Time Calculation (min)  42 min    Activity Tolerance  Patient tolerated treatment well;No increased pain    Behavior During Therapy  WFL for tasks assessed/performed       Past Medical History:  Diagnosis Date  . Allergy   . Anemia   . Cervical disc disorder with radiculopathy of cervical region 02/10/2016  . Chronic lower back pain   . Diverticulosis   . EKG, abnormal 01/06/2017  . Gastritis   . GERD (gastroesophageal reflux disease)   . H/O hiatal hernia   . Hiatal hernia   . History of amputation of lesser toe of left foot ([place]) 03/11/2015  . History of colon cancer   . Hypertension   . Hyperthyroidism    hx  . Hypothyroidism   . Migraine headache    "a few times/yr" (03/05/2015)  . Neck strain 02/06/2012  . Osteomyelitis ([place])    [name] 03/05/2015  . Osteomyelitis of toe of left foot ([place]) 03/05/2015  . Palpitations 01/05/2016  . Positive TB test   . Stroke ([place])   . Tubular adenoma of colon   . Tubular adenoma of colon     Past Surgical History:  Procedure Laterality Date  . ABDOMINAL HYSTERECTOMY  2006  . AMPUTATION TOE Left 03/05/2015   Procedure: AMPUTATION LEFT 5TH  TOE;  Surgeon: Wylene Simmer, MD;  Location: [facility];  Service: Orthopedics;  Laterality: Left;  . APPENDECTOMY  2006  . BREAST BIOPSY Left 11/26/2014  . [place]  . COLONOSCOPY     . GALLBLADDER SURGERY    . LOOP RECORDER INSERTION N/A 09/18/2017   Procedure: LOOP RECORDER INSERTION;  Surgeon: Evans Lance, MD;  Location: [facility];  Service: Cardiovascular;  Laterality: N/A;  . TEE WITHOUT CARDIOVERSION N/A 09/18/2017   Procedure: TRANSESOPHAGEAL ECHOCARDIOGRAM (TEE);  Surgeon: Pixie Casino, MD;  Location: [facility] ENDOSCOPY;  Service: Cardiovascular;  Laterality: N/A;  . TOE AMPUTATION Left 03/05/2015   5th toe  . TUBAL LIGATION Bilateral 1990  . UPPER GASTROINTESTINAL ENDOSCOPY      There were no vitals filed for this visit.  Subjective Assessment - 12/22/17 0851    Subjective  Pt has started walking 3 miles per day in the morning in the past week.  Tolerates well.  Reports she has stopped cholesterol medication due to side effects.  Is contacting MD about this.     Pertinent History  History of old CVA with L sided weakness.      Limitations  House hold activities    How long can you stand comfortably?  5 hours at work without pain    Patient Stated Goals  "to get rid of this pain and tightness"     Currently  in Pain?  Yes no pain just tightness    Pain Score  3     Pain Location  Neck    Pain Orientation  Right;Left    Pain Descriptors / Indicators  Tightness    Pain Type  Chronic pain    Pain Onset  More than a month ago    Pain Frequency  Constant    Aggravating Factors   working    Pain Relieving Factors  rest, heat, exercises and stretches         [facility] PT Assessment - 12/22/17 0904      AROM   Cervical Flexion  45    Cervical Extension  47    Cervical - Right Side Bend  43    Cervical - Left Side Bend  44    Cervical - Right Rotation  77    Cervical - Left Rotation  75                   [facility] Adult PT Treatment/Exercise - 12/22/17 0850      Neck Exercises: Seated   Other Seated Exercise  Seated thoracic extension (self mobilization) extending over back of chair with small towe roll placed for increased leverage.  Note  this was added to HEP, see pt instruction.       Neck Exercises: Supine   Other Supine Exercise  Supine shoulder extension with use of green theraband x 10 reps with cues for relaxed R shoulder during task.       Manual Therapy   Manual Therapy  Joint mobilization;Soft tissue mobilization;Myofascial release;Manual Traction    Manual therapy comments  Performed all manual therapy for decreased pain, improved flexibility and ROM.  Pt tolerated well and reports decreased tightness once MT ended.     Joint Mobilization  Supine:  Performed grade III lateral glide mobilization to entire cervical spine (both sides) to increase ROM esp into rotation.  Pt continues to demonstrate decreased spinal mobility in entire cervical spine, but especially in upper and lower c/spine.  Performed gentle upglides grade II in supine again with noted decreased ROM, therefore had her transition to prone and performed grade II PA mobilization to entire cervical and upper thoracic spine progressing to grade III mobilization.  Pt tolerated very well, however has marked tightness and decreased spinal mobility in upper thoracic region.  Ended with supine upper thoracic Grade V manipulation x 2 reps (2-3 levels apart) without cavitation, however pt reports good "release" and decreased tighntess.      Soft tissue mobilization  bil upper trap/ levator and bil paracervical, bilateral upper traps and cervical paraspinals     Myofascial Release  R levator scapulae    Manual Traction  sub occipital release and gentle manual traction              PT Education - 12/22/17 0853    Education provided  Yes    Education Details  additional self thoracic mob added to HEP    Person(s) Educated  Patient    Methods  Explanation;Demonstration;Handout    Comprehension  Verbalized understanding;Returned demonstration       PT Short Term Goals - 12/22/17 0908      PT SHORT TERM GOAL #1   Title  Pt will be independent with initial HEP  in order to indicate improved function and decreased pain.  (Target Date: 12/13/17)    Baseline  given updated HEP    Time  4  Period  Weeks    Status  Achieved      PT SHORT TERM GOAL #2   Title  Will assess NDI and write appropriate LTG.       Baseline  not given within first/second visit    Time  4    Period  Weeks    Status  Deferred      PT SHORT TERM GOAL #3   Title  Will assess neck flexor endurance test and write appropriate LTG.     Baseline  was not performed within first week of care    Time  4    Status  Deferred      PT SHORT TERM GOAL #4   Title  Pt will improve ROM in all planes by 5 deg in order to indicate improved cervical mobility and ability to perform ADLs.     Baseline  met, baseline: cervical flex 37, ext 45, R SB 30, L SB 34, R rot 70,  L rot 62; as of 12/22/17 cervical flex 45, ext 47, R SB 43, L SB 44, R rot 77, L rot 75   Time  4    Period  Weeks    Status  Achieved      PT SHORT TERM GOAL #5   Title  Pt will improve L shoulder abd strength to 4/5 in order to indicate improved functional strength.     Baseline  5/5 on 12/22/17    Time  4    Period  Weeks    Status  Achieved      PT SHORT TERM GOAL #6   Title  Pt will report no more than 4/10 pain in neck/shoulders during mobility in order to indicate improved function.      Baseline  reports no pain, just some tightness    Time  4    Period  Weeks    Status  Achieved        PT Long Term Goals - 11/13/17 1942      PT LONG TERM GOAL #1   Title  Pt will be independent with final HEP in order to indicate improved function and decreased pain.  (Target Date: 01/12/18)    Time  8    Period  Weeks    Status  New    Target Date  01/12/18      PT LONG TERM GOAL #2   Title  Pt will improve cervical rotation to 80 deg bilaterally with no more than 2/10 pain in order to indicate improved ability to complete ADLs.      Time  8    Period  Weeks    Status  New      PT LONG TERM GOAL #3   Title  Pt  will report no more than 2/10 pain in order to indicate improved ability to carryout work duties.      Time  8    Period  Weeks    Status  New      PT LONG TERM GOAL #4   Title  Pt will report reduction in headaches by 50% in order to indicate improved ability to carryout work duties.     Time  8    Period  Weeks    Status  New            Plan - 12/22/17 1036    Clinical Impression Statement  Pt reports good relief in pain and that dry needling seems to be  helping along with stretches and exercises.  Assessed STGs today.  Pt has met 4/6 LTGs with 2 remaining goals being deferred.  NDI was not given within first week of care, threrefore deferred and also neck flexor endurance test not formally assessed.  Pt was unable to perform at eval, so will likely keep LTG to reflect progress.      Clinical Impairments Affecting Rehab Potential  chronic positioning due to job requirement    PT Frequency  2x / week    PT Duration  8 weeks    PT Treatment/Interventions  ADLs/Self Care Home Management;Moist Heat;Traction;Ultrasound;Functional mobility training;Therapeutic activities;Therapeutic exercise;Neuromuscular re-education;Patient/family education;Manual techniques;Passive range of motion;Dry needling;Taping    PT Next Visit Plan  review tolerance to strengthening; continue to empahsize posture; progress exercises as tolerated; continue with soft tissue mobilization.     PT Home Exercise Plan  Upper trap and levator stretches, TPDN education    Consulted and Agree with Plan of Care  Patient       Patient will benefit from skilled therapeutic intervention in order to improve the following deficits and impairments:  Decreased mobility, Decreased range of motion, Hypomobility, Increased muscle spasms, Impaired flexibility, Postural dysfunction, Improper body mechanics, Impaired UE functional use, Pain  Visit Diagnosis: Cervicalgia  Abnormal posture     Problem List Patient Active Problem  List   Diagnosis Date Noted  . Ear pain, left 12/13/2017  . Edema 11/07/2017  . Hyperlipidemia   . Cerebrovascular accident (CVA) due to embolism of cerebral artery ([place]) 09/15/2017  . History of cholecystectomy 06/27/2017  . Small intestinal bacterial overgrowth 03/23/2017  . Paresthesia 01/06/2017  . Prediabetes 10/29/2015  . Tendinopathy of left rotator cuff 10/27/2015  . Essential hypertension   . Right shoulder pain 04/15/2014  . Obesity 04/15/2014  . Cervicalgia 02/11/2014  . Lateral knee pain, left 08/06/2013  . Allergic rhinoconjunctivitis 07/19/2013  . Thyroid nodule 02/12/2013  . GERD (gastroesophageal reflux disease) 02/06/2012  . Tobacco abuse 09/21/2011  . Hypothyroidism 09/21/2011  . Throat pain 09/21/2011    Cameron Sprang, PT, MPT [facility] [address] [place], [place], [address] Phone: [phone]   Fax:  [phone] 12/22/17, 10:41 AM  Name: Angelly Spearing MRN: [id number] Date of Birth: [birth year]

## 2017-12-22 NOTE — Assessment & Plan Note (Signed)
Unclear what the left ear findings is. ?? Otitis external, but will be atypical. Will treat at OE. Ofloxacin prescribed. Use Zyrtec for possible allergy as a cause of her chronic symptoms. Use Tylenol as needed for pain. F/U with ENT as planned.

## 2017-12-22 NOTE — Patient Instructions (Signed)
   Seated thoracic Extension  - Find a seat with a medium to high and rigid back. Place small towel roll between you and the back of the chair.  - Sit all the way back in your seat with you feet flat on the floor.  - Reach hands behind your head and extend thoracic spine over the back rest - * Be sure to "chin tuck" to avoid cervical extension Hold for 10-15 secs.  Repeat x 5 reps.  Do 2-3 times per day.  Try to do in the parlor chairs as these may be taller.

## 2017-12-22 NOTE — Telephone Encounter (Signed)
I called and left a second message for her to take Hydralazine 50 mg qid and not 100 mg BID. Will follow.

## 2017-12-22 NOTE — Assessment & Plan Note (Signed)
D/C Pravachol due to intolerance. Start Zetia.

## 2017-12-23 LAB — BASIC METABOLIC PANEL
BUN/Creatinine Ratio: 16 (ref 9–23)
BUN: 17 mg/dL (ref 6–24)
CO2: 20 mmol/L (ref 20–29)
Calcium: 9.9 mg/dL (ref 8.7–10.2)
Chloride: 103 mmol/L (ref 96–106)
Creatinine, Ser: 1.09 mg/dL — ABNORMAL HIGH (ref 0.57–1.00)
GFR calc Af Amer: 66 mL/min/{1.73_m2} (ref >59)
GFR calc non Af Amer: 57 mL/min/{1.73_m2} — ABNORMAL LOW (ref >59)
Glucose: 102 mg/dL — ABNORMAL HIGH (ref 65–99)
Potassium: 4.3 mmol/L (ref 3.5–5.2)
Sodium: 142 mmol/L (ref 134–144)

## 2017-12-25 ENCOUNTER — Encounter: Payer: BLUE CROSS/BLUE SHIELD | Admitting: Physical Therapy

## 2017-12-25 ENCOUNTER — Ambulatory Visit (INDEPENDENT_AMBULATORY_CARE_PROVIDER_SITE_OTHER): Payer: BLUE CROSS/BLUE SHIELD | Admitting: *Deleted

## 2017-12-25 ENCOUNTER — Telehealth: Payer: Self-pay | Admitting: Family Medicine

## 2017-12-25 DIAGNOSIS — I639 Cerebral infarction, unspecified: Secondary | ICD-10-CM

## 2017-12-25 NOTE — Progress Notes (Signed)
[facility] Summary Report / Loop Recorder 

## 2017-12-25 NOTE — Telephone Encounter (Signed)
I called and discussed lab result with her. All questions were answered. F/U as scheduled.

## 2017-12-27 ENCOUNTER — Ambulatory Visit: Payer: BLUE CROSS/BLUE SHIELD | Admitting: Physical Therapy

## 2017-12-29 ENCOUNTER — Encounter: Payer: Self-pay | Admitting: Physical Therapy

## 2017-12-29 ENCOUNTER — Ambulatory Visit: Payer: BLUE CROSS/BLUE SHIELD | Attending: Neurology | Admitting: Physical Therapy

## 2017-12-29 ENCOUNTER — Encounter: Payer: BLUE CROSS/BLUE SHIELD | Admitting: Physical Therapy

## 2017-12-29 DIAGNOSIS — M25511 Pain in right shoulder: Secondary | ICD-10-CM | POA: Diagnosis present

## 2017-12-29 DIAGNOSIS — M542 Cervicalgia: Secondary | ICD-10-CM | POA: Diagnosis not present

## 2017-12-29 DIAGNOSIS — G8929 Other chronic pain: Secondary | ICD-10-CM | POA: Diagnosis present

## 2017-12-29 DIAGNOSIS — M25512 Pain in left shoulder: Secondary | ICD-10-CM | POA: Diagnosis present

## 2017-12-29 DIAGNOSIS — R293 Abnormal posture: Secondary | ICD-10-CM | POA: Insufficient documentation

## 2017-12-29 NOTE — Therapy (Signed)
[facility] [address] [place], [place], [address] Phone: [phone]   Fax:  [phone]  Physical Therapy Treatment  Patient Details  Name: Teresa [name] MRN: [id number] Date of Birth: July 31, 1960 Referring Provider: [name], MD   Encounter Date: 12/29/2017  PT End of Session - 12/29/17 0926    Visit Number  8    Number of Visits  17    Date for PT Re-Evaluation  01/12/18    Authorization Type  BCBS    Authorization - Visit Number  7    Authorization - Number of Visits  30    PT Start Time  0845    PT Stop Time  0935    PT Time Calculation (min)  50 min    Activity Tolerance  Patient tolerated treatment well;No increased pain    Behavior During Therapy  WFL for tasks assessed/performed       Past Medical History:  Diagnosis Date  . Allergy   . Anemia   . Cervical disc disorder with radiculopathy of cervical region 02/10/2016  . Chronic lower back pain   . Diverticulosis   . EKG, abnormal 01/06/2017  . Gastritis   . GERD (gastroesophageal reflux disease)   . H/O hiatal hernia   . Hiatal hernia   . History of amputation of lesser toe of left foot ([place]) 03/11/2015  . History of colon cancer   . Hypertension   . Hyperthyroidism    hx  . Hypothyroidism   . Migraine headache    "a few times/yr" (03/05/2015)  . Neck strain 02/06/2012  . Osteomyelitis ([place])    [name] 03/05/2015  . Osteomyelitis of toe of left foot ([place]) 03/05/2015  . Palpitations 01/05/2016  . Positive TB test   . Stroke ([place])   . Tubular adenoma of colon   . Tubular adenoma of colon     Past Surgical History:  Procedure Laterality Date  . ABDOMINAL HYSTERECTOMY  2006  . AMPUTATION TOE Left 03/05/2015   Procedure: AMPUTATION LEFT 5TH  TOE;  Surgeon: Wylene Simmer, MD;  Location: [facility];  Service: Orthopedics;  Laterality: Left;  . APPENDECTOMY  2006  . BREAST BIOPSY Left 11/26/2014  . [place]  . COLONOSCOPY    .  GALLBLADDER SURGERY    . LOOP RECORDER INSERTION N/A 09/18/2017   Procedure: LOOP RECORDER INSERTION;  Surgeon: Evans Lance, MD;  Location: [facility];  Service: Cardiovascular;  Laterality: N/A;  . TEE WITHOUT CARDIOVERSION N/A 09/18/2017   Procedure: TRANSESOPHAGEAL ECHOCARDIOGRAM (TEE);  Surgeon: Pixie Casino, MD;  Location: [facility] ENDOSCOPY;  Service: Cardiovascular;  Laterality: N/A;  . TOE AMPUTATION Left 03/05/2015   5th toe  . TUBAL LIGATION Bilateral 1990  . UPPER GASTROINTESTINAL ENDOSCOPY      There were no vitals filed for this visit.  Subjective Assessment - 12/29/17 0847    Subjective  pt reported no pain today, just tightness in her neck. She reports she is doing her exercises and strethces at home    Pertinent History  History of old CVA with L sided weakness.      Limitations  House hold activities    How long can you stand comfortably?  5 hours at work without pain    Patient Stated Goals  "to get rid of this pain and tightness"     Currently in Pain?  No/denies         [facility] Adult PT  Treatment/Exercise - 12/29/17 0948      Neck Exercises: Seated   Other Seated Exercise  Seated thoracic extension (self mobilization) extending over back of chair with small towel roll placed for increased leverage.      Neck Exercises: Supine   Other Supine Exercise  --      Shoulder Exercises: Supine   Other Supine Exercises  Shoulder ext with green tband 10 reps x 1 set      Moist Heat Therapy   Number Minutes Moist Heat  15 Minutes    Moist Heat Location  Shoulder;Cervical      Electrical Stimulation   Electrical Stimulation Location  bil cervical paraspinals and upper traps    Electrical Stimulation Action  for decreased muscle tightness    Electrical Stimulation Parameters  IFC concurrent with moist heat with intensity to tolerance    Electrical Stimulation Goals  Pain tightness      Manual Therapy   Manual Therapy  Joint mobilization;Soft tissue  mobilization;Myofascial release;Manual Traction    Manual therapy comments  Performed all manual therapy for improved flexibility and ROM.  Pt tolerated well and reports decreased tightness once MT ended.     Soft tissue mobilization  bil upper trap/ levator and bil paracervical, bilateral upper traps and cervical paraspinals     Myofascial Release  bil cervical paraspinals, upper traps, scalenes and rhomboids    Manual Traction  sub occipital release and gentle manual traction       Neck Exercises: Stretches   Upper Trapezius Stretch  Right;Left;2 reps;30 seconds    Levator Stretch  Right;Left;2 reps;30 seconds               PT Short Term Goals - 12/22/17 0908      PT SHORT TERM GOAL #1   Title  Pt will be independent with initial HEP in order to indicate improved function and decreased pain.  (Target Date: 12/13/17)    Baseline  given updated HEP    Time  4    Period  Weeks    Status  Achieved      PT SHORT TERM GOAL #2   Title  Will assess NDI and write appropriate LTG.       Baseline  not given within first/second visit    Time  4    Period  Weeks    Status  Deferred      PT SHORT TERM GOAL #3   Title  Will assess neck flexor endurance test and write appropriate LTG.     Baseline  was not performed within first week of care    Time  4    Status  Deferred      PT SHORT TERM GOAL #4   Title  Pt will improve ROM in all planes by 5 deg in order to indicate improved cervical mobility and ability to perform ADLs.     Baseline  met, see assessment section    Time  4    Period  Weeks    Status  Achieved      PT SHORT TERM GOAL #5   Title  Pt will improve L shoulder abd strength to 4/5 in order to indicate improved functional strength.     Baseline  5/5 on 12/22/17    Time  4    Period  Weeks    Status  Achieved      PT SHORT TERM GOAL #6   Title  Pt will report no more  than 4/10 pain in neck/shoulders during mobility in order to indicate improved function.       Baseline  reports no pain, just some tightness    Time  4    Period  Weeks    Status  Achieved        PT Long Term Goals - 11/13/17 1942      PT LONG TERM GOAL #1   Title  Pt will be independent with final HEP in order to indicate improved function and decreased pain.  (Target Date: 01/12/18)    Time  8    Period  Weeks    Status  New    Target Date  01/12/18      PT LONG TERM GOAL #2   Title  Pt will improve cervical rotation to 80 deg bilaterally with no more than 2/10 pain in order to indicate improved ability to complete ADLs.      Time  8    Period  Weeks    Status  New      PT LONG TERM GOAL #3   Title  Pt will report no more than 2/10 pain in order to indicate improved ability to carryout work duties.      Time  8    Period  Weeks    Status  New      PT LONG TERM GOAL #4   Title  Pt will report reduction in headaches by 50% in order to indicate improved ability to carryout work duties.     Time  8    Period  Weeks    Status  New            Plan - 12/29/17 0926    Clinical Impression Statement  Today's skilled session focused on decreasing pts cervical tightness and increasing mobility. Pt is progessing toward goals and would benefit from continued PT to progress toward unmet goals.    Clinical Impairments Affecting Rehab Potential  chronic positioning due to job requirement    PT Frequency  2x / week    PT Duration  8 weeks    PT Treatment/Interventions  ADLs/Self Care Home Management;Moist Heat;Traction;Ultrasound;Functional mobility training;Therapeutic activities;Therapeutic exercise;Neuromuscular re-education;Patient/family education;Manual techniques;Passive range of motion;Dry needling;Taping    PT Next Visit Plan  review tolerance to strengthening; continue to empahsize posture; progress exercises as tolerated; continue with soft tissue mobilization. Rom or isometric exercises as pt tolerates.    PT Home Exercise Plan  Upper trap and levator stretches,  TPDN education    Consulted and Agree with Plan of Care  Patient       Patient will benefit from skilled therapeutic intervention in order to improve the following deficits and impairments:  Decreased mobility, Decreased range of motion, Hypomobility, Increased muscle spasms, Impaired flexibility, Postural dysfunction, Improper body mechanics, Impaired UE functional use, Pain  Visit Diagnosis: Cervicalgia  Abnormal posture     Problem List Patient Active Problem List   Diagnosis Date Noted  . Ear pain, left 12/13/2017  . Edema 11/07/2017  . Hyperlipidemia   . Cerebrovascular accident (CVA) due to embolism of cerebral artery ([place]) 09/15/2017  . History of cholecystectomy 06/27/2017  . Small intestinal bacterial overgrowth 03/23/2017  . Paresthesia 01/06/2017  . Prediabetes 10/29/2015  . Tendinopathy of left rotator cuff 10/27/2015  . Essential hypertension   . Right shoulder pain 04/15/2014  . Obesity 04/15/2014  . Cervicalgia 02/11/2014  . Lateral knee pain, left 08/06/2013  . Allergic rhinoconjunctivitis 07/19/2013  . Thyroid  nodule 02/12/2013  . GERD (gastroesophageal reflux disease) 02/06/2012  . Tobacco abuse 09/21/2011  . Hypothyroidism 09/21/2011  . Throat pain 09/21/2011   Halina Andreas, SPTA 12/29/2017, 9:56 AM  [facility] [address] [place], [place], [address] Phone: [phone]   Fax:  [phone]  Name: Teresa [name] MRN: [id number] Date of Birth: 1960-12-29

## 2018-01-03 ENCOUNTER — Ambulatory Visit: Payer: BLUE CROSS/BLUE SHIELD | Admitting: Physical Therapy

## 2018-01-03 ENCOUNTER — Encounter: Payer: Self-pay | Admitting: Physical Therapy

## 2018-01-03 DIAGNOSIS — M542 Cervicalgia: Secondary | ICD-10-CM

## 2018-01-03 DIAGNOSIS — R293 Abnormal posture: Secondary | ICD-10-CM

## 2018-01-03 NOTE — Therapy (Signed)
[facility] [address] [place], [place], [address] Phone: [phone]   Fax:  [phone]  Physical Therapy Treatment  Patient Details  Name: Teresa [name] MRN: [id number] Date of Birth: 1961-05-04 Referring Provider: [name], MD   Encounter Date: 01/03/2018  PT End of Session - 01/03/18 1016    Visit Number  9    Number of Visits  17    Date for PT Re-Evaluation  01/12/18    Authorization Type  BCBS    Authorization - Visit Number  7    Authorization - Number of Visits  30    PT Start Time  0845    PT Stop Time  0928    PT Time Calculation (min)  43 min    Equipment Utilized During Treatment  Gait belt    Activity Tolerance  Patient tolerated treatment well       Past Medical History:  Diagnosis Date  . Allergy   . Anemia   . Cervical disc disorder with radiculopathy of cervical region 02/10/2016  . Chronic lower back pain   . Diverticulosis   . EKG, abnormal 01/06/2017  . Gastritis   . GERD (gastroesophageal reflux disease)   . H/O hiatal hernia   . Hiatal hernia   . History of amputation of lesser toe of left foot ([place]) 03/11/2015  . History of colon cancer   . Hypertension   . Hyperthyroidism    hx  . Hypothyroidism   . Migraine headache    "a few times/yr" (03/05/2015)  . Neck strain 02/06/2012  . Osteomyelitis ([place])    [name] 03/05/2015  . Osteomyelitis of toe of left foot ([place]) 03/05/2015  . Palpitations 01/05/2016  . Positive TB test   . Stroke ([place])   . Tubular adenoma of colon   . Tubular adenoma of colon     Past Surgical History:  Procedure Laterality Date  . ABDOMINAL HYSTERECTOMY  2006  . AMPUTATION TOE Left 03/05/2015   Procedure: AMPUTATION LEFT 5TH  TOE;  Surgeon: Wylene Simmer, MD;  Location: [facility];  Service: Orthopedics;  Laterality: Left;  . APPENDECTOMY  2006  . BREAST BIOPSY Left 11/26/2014  . [place]  . COLONOSCOPY    . GALLBLADDER SURGERY    .  LOOP RECORDER INSERTION N/A 09/18/2017   Procedure: LOOP RECORDER INSERTION;  Surgeon: Evans Lance, MD;  Location: [facility];  Service: Cardiovascular;  Laterality: N/A;  . TEE WITHOUT CARDIOVERSION N/A 09/18/2017   Procedure: TRANSESOPHAGEAL ECHOCARDIOGRAM (TEE);  Surgeon: Pixie Casino, MD;  Location: [facility] ENDOSCOPY;  Service: Cardiovascular;  Laterality: N/A;  . TOE AMPUTATION Left 03/05/2015   5th toe  . TUBAL LIGATION Bilateral 1990  . UPPER GASTROINTESTINAL ENDOSCOPY      There were no vitals filed for this visit.  Subjective Assessment - 01/03/18 0851    Subjective  pt reported no pain today, and mild tightness in her neck. She reports she is doing her exercises and strethces at home and also walking /going to gym with her friend.    Pertinent History  History of old CVA with L sided weakness.      Limitations  House hold activities    How long can you stand comfortably?  5 hours at work without pain    Patient Stated Goals  "to get rid of this pain and tightness"     Currently in Pain?  No/denies  [facility] Adult PT Treatment/Exercise - 01/03/18 1022      Neck Exercises: Theraband   Scapula Retraction  10 reps;Green cues on posture      Neck Exercises: Seated   Cervical Isometrics  Flexion;Extension;Right lateral flexion;Left lateral flexion;Right rotation;Left rotation;5 secs;5 reps verbal cues to get proper manual resistance    Neck Retraction  10 reps;3 secs      Neck Exercises: Supine   Other Supine Exercise  Supine shoulder extension with use of green theraband x 10 reps       Manual Therapy   Manual Therapy  Joint mobilization;Soft tissue mobilization;Myofascial release;Manual Traction    Manual therapy comments  Performed all manual therapy for improved flexibility and ROM.  Pt tolerated well and reports decreased tightness once MT ended.     Soft tissue mobilization  bil upper trap/ levator and bil paracervical, bilateral  upper traps and cervical paraspinals     Myofascial Release  bil cervical paraspinals, upper traps, scalenes and rhomboids    Manual Traction  sub occipital release and gentle manual traction     Muscle Energy Technique  concurent with gentle cervical distraction: scapular depressoin x 10 reps, then scapular retraction x 10 reps; then concurrent with bil manual shoulder depression- chin tucks with 5 sec holds x 10 reps.       Neck Exercises: Stretches   Upper Trapezius Stretch  Right;Left;30 seconds;3 reps    Levator Stretch  Right;Left;30 seconds;3 reps    Levator Stretch Limitations  cues to on positioning               PT Short Term Goals - 12/22/17 0908      PT SHORT TERM GOAL #1   Title  Pt will be independent with initial HEP in order to indicate improved function and decreased pain.  (Target Date: 12/13/17)    Baseline  given updated HEP    Time  4    Period  Weeks    Status  Achieved      PT SHORT TERM GOAL #2   Title  Will assess NDI and write appropriate LTG.       Baseline  not given within first/second visit    Time  4    Period  Weeks    Status  Deferred      PT SHORT TERM GOAL #3   Title  Will assess neck flexor endurance test and write appropriate LTG.     Baseline  was not performed within first week of care    Time  4    Status  Deferred      PT SHORT TERM GOAL #4   Title  Pt will improve ROM in all planes by 5 deg in order to indicate improved cervical mobility and ability to perform ADLs.     Baseline  met, see assessment section    Time  4    Period  Weeks    Status  Achieved      PT SHORT TERM GOAL #5   Title  Pt will improve L shoulder abd strength to 4/5 in order to indicate improved functional strength.     Baseline  5/5 on 12/22/17    Time  4    Period  Weeks    Status  Achieved      PT SHORT TERM GOAL #6   Title  Pt will report no more than 4/10 pain in neck/shoulders during mobility in order to indicate improved function.  Baseline  reports no pain, just some tightness    Time  4    Period  Weeks    Status  Achieved        PT Long Term Goals - 11/13/17 1942      PT LONG TERM GOAL #1   Title  Pt will be independent with final HEP in order to indicate improved function and decreased pain.  (Target Date: 01/12/18)    Time  8    Period  Weeks    Status  New    Target Date  01/12/18      PT LONG TERM GOAL #2   Title  Pt will improve cervical rotation to 80 deg bilaterally with no more than 2/10 pain in order to indicate improved ability to complete ADLs.      Time  8    Period  Weeks    Status  New      PT LONG TERM GOAL #3   Title  Pt will report no more than 2/10 pain in order to indicate improved ability to carryout work duties.      Time  8    Period  Weeks    Status  New      PT LONG TERM GOAL #4   Title  Pt will report reduction in headaches by 50% in order to indicate improved ability to carryout work duties.     Time  8    Period  Weeks    Status  New            Plan - 01/03/18 1017    Clinical Impression Statement  Today's skilled session focused on reducing any cervical tightness and strengthening. Pt has reported decreased pain and tightness since last session. Pt continues to progress toward goals and would benefit from continued PT iin order to meet unmet goals.    Rehab Potential  Good    Clinical Impairments Affecting Rehab Potential  chronic positioning due to job requirement    PT Frequency  2x / week    PT Duration  8 weeks    PT Treatment/Interventions  ADLs/Self Care Home Management;Moist Heat;Traction;Ultrasound;Functional mobility training;Therapeutic activities;Therapeutic exercise;Neuromuscular re-education;Patient/family education;Manual techniques;Passive range of motion;Dry needling;Taping    PT Next Visit Plan  re-heck cervical AROM. review tolerance to strengthening; continue to empahsize posture; progress exercises as tolerated; continue with soft tissue  mobilizations as needed, emphasize ex's at this time as pt has been mostly pain free for past few sessions.     PT Home Exercise Plan  Upper trap and levator stretches, TPDN education    Consulted and Agree with Plan of Care  Patient       Patient will benefit from skilled therapeutic intervention in order to improve the following deficits and impairments:  Decreased mobility, Decreased range of motion, Hypomobility, Increased muscle spasms, Impaired flexibility, Postural dysfunction, Improper body mechanics, Impaired UE functional use, Pain  Visit Diagnosis: Cervicalgia  Abnormal posture     Problem List Patient Active Problem List   Diagnosis Date Noted  . Ear pain, left 12/13/2017  . Edema 11/07/2017  . Hyperlipidemia   . Cerebrovascular accident (CVA) due to embolism of cerebral artery ([place]) 09/15/2017  . History of cholecystectomy 06/27/2017  . Small intestinal bacterial overgrowth 03/23/2017  . Paresthesia 01/06/2017  . Prediabetes 10/29/2015  . Tendinopathy of left rotator cuff 10/27/2015  . Essential hypertension   . Right shoulder pain 04/15/2014  . Obesity 04/15/2014  . Cervicalgia 02/11/2014  .  Lateral knee pain, left 08/06/2013  . Allergic rhinoconjunctivitis 07/19/2013  . Thyroid nodule 02/12/2013  . GERD (gastroesophageal reflux disease) 02/06/2012  . Tobacco abuse 09/21/2011  . Hypothyroidism 09/21/2011  . Throat pain 09/21/2011   Halina Andreas, SPTA 01/03/2018, 11:25 AM  [facility] [address] [place], [place], [address] Phone: [phone]   Fax:  [phone]  Name: Teresa [name] MRN: [id number] Date of Birth: [birth year]

## 2018-01-04 ENCOUNTER — Other Ambulatory Visit: Payer: Self-pay | Admitting: Family Medicine

## 2018-01-04 DIAGNOSIS — I10 Essential (primary) hypertension: Secondary | ICD-10-CM

## 2018-01-04 MED ORDER — LEVOTHYROXINE SODIUM 112 MCG PO TABS: ORAL_TABLET | ORAL | 1 refills | Status: DC

## 2018-01-04 MED ORDER — SPIRONOLACTONE 50 MG PO TABS: 50.0000 mg | ORAL_TABLET | Freq: Every day | ORAL | 1 refills | Status: DC

## 2018-01-05 ENCOUNTER — Ambulatory Visit: Payer: BLUE CROSS/BLUE SHIELD | Admitting: Physical Therapy

## 2018-01-05 ENCOUNTER — Encounter: Payer: Self-pay | Admitting: Physical Therapy

## 2018-01-05 VITALS — BP 148/96 | HR 86

## 2018-01-05 DIAGNOSIS — M542 Cervicalgia: Secondary | ICD-10-CM

## 2018-01-05 DIAGNOSIS — R293 Abnormal posture: Secondary | ICD-10-CM

## 2018-01-05 NOTE — Therapy (Signed)
[facility] [address] [place], [place], [address] Phone: [phone]   Fax:  [phone]  Physical Therapy Treatment  Patient Details  Name: Teresa [name] MRN: [id number] Date of Birth: [birth year] Referring Provider: [name], MD   Encounter Date: 01/05/2018  PT End of Session - 01/05/18 1022    Visit Number  10    Number of Visits  17    Date for PT Re-Evaluation  01/12/18    Authorization Type  BC[employer]    Authorization - Visit Number  7    Authorization - Number of Visits  30    PT Start Time  8099    PT Stop Time  0930    PT Time Calculation (min)  43 min    Equipment Utilized During Treatment  Gait belt    Activity Tolerance  Patient tolerated treatment well       Past Medical History:  Diagnosis Date  . Allergy   . Anemia   . Cervical disc disorder with radiculopathy of cervical region 02/10/2016  . Chronic lower back pain   . Diverticulosis   . EKG, abnormal 01/06/2017  . Gastritis   . GERD (gastroesophageal reflux disease)   . H/O hiatal hernia   . Hiatal hernia   . History of amputation of lesser toe of left foot ([place]) 03/11/2015  . History of colon cancer   . Hypertension   . Hyperthyroidism    hx  . Hypothyroidism   . Migraine headache    "a few times/yr" (03/05/2015)  . Neck strain 02/06/2012  . Osteomyelitis ([place])    [name] 03/05/2015  . Osteomyelitis of toe of left foot ([place]) 03/05/2015  . Palpitations 01/05/2016  . Positive TB test   . Stroke ([place])   . Tubular adenoma of colon   . Tubular adenoma of colon     Past Surgical History:  Procedure Laterality Date  . ABDOMINAL HYSTERECTOMY  2006  . AMPUTATION TOE Left 03/05/2015   Procedure: AMPUTATION LEFT 5TH  TOE;  Surgeon: Wylene Simmer, MD;  Location: [facility];  Service: Orthopedics;  Laterality: Left;  . APPENDECTOMY  2006  . BREAST BIOPSY Left 11/26/2014  . [place]  . COLONOSCOPY    . GALLBLADDER SURGERY    .  LOOP RECORDER INSERTION N/A 09/18/2017   Procedure: LOOP RECORDER INSERTION;  Surgeon: Evans Lance, MD;  Location: [facility];  Service: Cardiovascular;  Laterality: N/A;  . TEE WITHOUT CARDIOVERSION N/A 09/18/2017   Procedure: TRANSESOPHAGEAL ECHOCARDIOGRAM (TEE);  Surgeon: Pixie Casino, MD;  Location: [facility] ENDOSCOPY;  Service: Cardiovascular;  Laterality: N/A;  . TOE AMPUTATION Left 03/05/2015   5th toe  . TUBAL LIGATION Bilateral 1990  . UPPER GASTROINTESTINAL ENDOSCOPY      Vitals:   01/05/18 0851  BP: (!) 148/96  Pulse: 86    Subjective Assessment - 01/05/18 0848    Subjective  pt reported no pain today, and mild tightness in her neck. Pt reported she forgot to take Bp meds today. She reports she is doing her exercises and strethces at home and also went for a walk with morning. Dry needling starts back on the 01/12/18.     Pertinent History  History of old CVA with L sided weakness.      Limitations  House hold activities    How long can you stand comfortably?  5 hours at work without pain    Patient Stated Goals  "  to get rid of this pain and tightness"     Currently in Pain?  No/denies         [facility] Adult PT Treatment/Exercise - 01/05/18 1034      Neck Exercises: Seated   Cervical Isometrics  Flexion;Right rotation;Left rotation;5 secs;10 reps      Shoulder Exercises: Prone   Retraction  AROM;Strengthening;Both;10 reps;Weights    Retraction Weight (lbs)  2lb    Retraction Limitations  vc for proper technique    Flexion  AROM;Strengthening;Both;10 reps    Extension  AROM;Both;10 reps;Weights    Extension Weight (lbs)  2 lb    Extension Limitations  verbal and tactile cues on technique      Shoulder Exercises: Standing   Horizontal ABduction  AROM;Strengthening;Both;10 reps;Weights with back on against ball on [place]    Horizontal ABduction Weight (lbs)  2 lbs    Extension  AROM;Strengthening;Both;10 reps;Theraband    Theraband Level (Shoulder Extension)   Level 3 (Green)    Extension Limitations  vc to keep elbow straight    Retraction  Theraband;10 reps;Both;AROM    Theraband Level (Shoulder Retraction)  Level 3 (Green)      Shoulder Exercises: Stretch   Llorente Stretch - Flexion  Other (comment) rolling ball up Morr x 10 holding 5 sec each      Manual Therapy   Manual Therapy  Joint mobilization;Soft tissue mobilization;Myofascial release;Manual Traction    Manual therapy comments  Performed all manual therapy for improved flexibility and ROM.  Pt tolerated well and reports decreased tightness once MT ended.     Soft tissue mobilization  bil upper trap/ levator and bil paracervical, bilateral upper traps and cervical paraspinals     Myofascial Release  bil cervical paraspinals, upper traps, scalenes and rhomboids    Manual Traction  sub occipital release and gentle manual traction       Neck Exercises: Stretches   Upper Trapezius Stretch  Right;Left;30 seconds;3 reps    Levator Stretch  Right;Left;30 seconds;3 reps               PT Short Term Goals - 12/22/17 0908      PT SHORT TERM GOAL #1   Title  Pt will be independent with initial HEP in order to indicate improved function and decreased pain.  (Target Date: 12/13/17)    Baseline  given updated HEP    Time  4    Period  Weeks    Status  Achieved      PT SHORT TERM GOAL #2   Title  Will assess NDI and write appropriate LTG.       Baseline  not given within first/second visit    Time  4    Period  Weeks    Status  Deferred      PT SHORT TERM GOAL #3   Title  Will assess neck flexor endurance test and write appropriate LTG.     Baseline  was not performed within first week of care    Time  4    Status  Deferred      PT SHORT TERM GOAL #4   Title  Pt will improve ROM in all planes by 5 deg in order to indicate improved cervical mobility and ability to perform ADLs.     Baseline  met, see assessment section    Time  4    Period  Weeks    Status  Achieved      PT  SHORT  TERM GOAL #5   Title  Pt will improve L shoulder abd strength to 4/5 in order to indicate improved functional strength.     Baseline  5/5 on 12/22/17    Time  4    Period  Weeks    Status  Achieved      PT SHORT TERM GOAL #6   Title  Pt will report no more than 4/10 pain in neck/shoulders during mobility in order to indicate improved function.      Baseline  reports no pain, just some tightness    Time  4    Period  Weeks    Status  Achieved        PT Long Term Goals - 11/13/17 1942      PT LONG TERM GOAL #1   Title  Pt will be independent with final HEP in order to indicate improved function and decreased pain.  (Target Date: 01/12/18)    Time  8    Period  Weeks    Status  New    Target Date  01/12/18      PT LONG TERM GOAL #2   Title  Pt will improve cervical rotation to 80 deg bilaterally with no more than 2/10 pain in order to indicate improved ability to complete ADLs.      Time  8    Period  Weeks    Status  New      PT LONG TERM GOAL #3   Title  Pt will report no more than 2/10 pain in order to indicate improved ability to carryout work duties.      Time  8    Period  Weeks    Status  New      PT LONG TERM GOAL #4   Title  Pt will report reduction in headaches by 50% in order to indicate improved ability to carryout work duties.     Time  8    Period  Weeks    Status  New            Plan - 01/05/18 1023    Clinical Impression Statement  today's skilled session continued to focus on cervical tightness and working on postural exercises. Pt's cervical pain continues to decrease. Pt is progressing toward goals and  would benefit from further PT in order to meet unmet goals.    Rehab Potential  Good    Clinical Impairments Affecting Rehab Potential  chronic positioning due to job requirement    PT Frequency  2x / week    PT Duration  8 weeks    PT Treatment/Interventions  ADLs/Self Care Home Management;Moist Heat;Traction;Ultrasound;Functional mobility  training;Therapeutic activities;Therapeutic exercise;Neuromuscular re-education;Patient/family education;Manual techniques;Passive range of motion;Dry needling;Taping    PT Next Visit Plan LTGs due next week (01/12/18), re-check cervical rom.  continue to empahsize posture; progress exercises as tolerated; continue with soft tissue mobilization. Rom, isometric and scapular exercises as pt tolerates.     PT Home Exercise Plan  Upper trap and levator stretches, TPDN education    Consulted and Agree with Plan of Care  Patient       Patient will benefit from skilled therapeutic intervention in order to improve the following deficits and impairments:  Decreased mobility, Decreased range of motion, Hypomobility, Increased muscle spasms, Impaired flexibility, Postural dysfunction, Improper body mechanics, Impaired UE functional use, Pain  Visit Diagnosis: Cervicalgia  Abnormal posture     Problem List Patient Active Problem List   Diagnosis Date  Noted  . Ear pain, left 12/13/2017  . Edema 11/07/2017  . Hyperlipidemia   . Cerebrovascular accident (CVA) due to embolism of cerebral artery ([place]) 09/15/2017  . History of cholecystectomy 06/27/2017  . Small intestinal bacterial overgrowth 03/23/2017  . Paresthesia 01/06/2017  . Prediabetes 10/29/2015  . Tendinopathy of left rotator cuff 10/27/2015  . Essential hypertension   . Right shoulder pain 04/15/2014  . Obesity 04/15/2014  . Cervicalgia 02/11/2014  . Lateral knee pain, left 08/06/2013  . Allergic rhinoconjunctivitis 07/19/2013  . Thyroid nodule 02/12/2013  . GERD (gastroesophageal reflux disease) 02/06/2012  . Tobacco abuse 09/21/2011  . Hypothyroidism 09/21/2011  . Throat pain 09/21/2011    Halina Andreas, SPTA 01/05/2018, 10:48 AM  [facility] [address] [place], [place], [address] Phone: [phone]   Fax:  [phone]  Name: Teresa [name] MRN:  [id number] Date of Birth: 1961-02-22

## 2018-01-10 ENCOUNTER — Ambulatory Visit: Payer: BLUE CROSS/BLUE SHIELD | Admitting: Physical Therapy

## 2018-01-11 ENCOUNTER — Ambulatory Visit (INDEPENDENT_AMBULATORY_CARE_PROVIDER_SITE_OTHER): Payer: BLUE CROSS/BLUE SHIELD | Admitting: Cardiovascular Disease

## 2018-01-11 ENCOUNTER — Encounter: Payer: Self-pay | Admitting: Cardiovascular Disease

## 2018-01-11 VITALS — BP 188/99 | HR 76 | Ht 65.0 in | Wt 202.0 lb

## 2018-01-11 DIAGNOSIS — I10 Essential (primary) hypertension: Secondary | ICD-10-CM

## 2018-01-11 DIAGNOSIS — I639 Cerebral infarction, unspecified: Secondary | ICD-10-CM

## 2018-01-11 DIAGNOSIS — E7800 Pure hypercholesterolemia, unspecified: Secondary | ICD-10-CM | POA: Diagnosis not present

## 2018-01-11 MED ORDER — CHLORTHALIDONE 25 MG PO TABS: 25.0000 mg | ORAL_TABLET | Freq: Every day | ORAL | 1 refills | Status: DC

## 2018-01-11 NOTE — Patient Instructions (Addendum)
Medication Instructions:  STOP ZETIA  START CHLORTHALIDONE 25 MG DAILY   DECREASE HYDRALAZINE TO 50 MG THREE TIMES A DAY   Labwork: NONE  Testing/Procedures: NONE  Follow-Up: Your physician recommends that you schedule a follow-up appointment in: [place] [facility] FOR LIPIDS AND BLOOD PRESSURE  Your physician recommends that you schedule a follow-up appointment in: 3 MONTHS WITH DR [facility]   Any Other Special Instructions Will Be Listed Below (If Applicable).  MONITOR YOUR BLOOD PRESSURE AT HOME DAILY. IF IT REMAINS ABOVE 130/80 INCREASE YOUR HYDRALAZINE TO 100 MG THREE TIMES A DAY   If you need a refill on your cardiac medications before your next appointment, please call your pharmacy.

## 2018-01-11 NOTE — Progress Notes (Signed)
Cardiology Office Note   Date:  01/11/2018   ID:  [name], DOB 09/06/1960, MRN [id number]  PCP:  Kinnie Feil, MD  Cardiologist:   Skeet Latch, MD   Chief Complaint  Patient presents with  . New Patient (Initial Visit)     History of Present Illness: Teresa [name] is a 57 y.o. female with embolic stroke, hypertension, palpitations, moderate LVH, and hyperthyroidism who presents for follow up.  Teresa [name] saw [name], MD, on 01/05/16.  At that appointment she reported occasional palpitations and burning in her chest.  She had an echo on 7/13 showed LVEF 55-60% with grade 2 diastolic dysfunction and mild MR.  Teresa [name] reports one year of palpitations and chest pain that occurs sporadically.  She was referred for [facility] 02/24/16 that revealed LVEF 47% and no ischemia.  At her appointment on 01/2016 her blood pressure was elevated so she was switched from metoprolol to carvedilol.  She was subsequently diagnosed with cervicalgia.   Since her last appointment Teresa [name] presented 08/2017 with cryptogenic embolic stroke.  Echo at that time revealed LVEF 60 to 65% with grade 1 diastolic dysfunction and moderate aortic regurgitation She had a TEE that was negative for left atrial appendage thrombus and her aortic regurgitation was noted to be mild.  A loop recorder was implanted and no significant arrhythmias have been recorded thus far.   Since I last saw her amlodipine was discontinued due to swelling.  She continues to struggle with her blood pressure.  She brings a log showing that it is been running mostly in the 140s to 150s over 70s to 100s.  She is frustrated because since her stroke she has been exercising changing her diet.  However she continues to gain weight.  She has no lower extremity edema, orthopnea, or PND.  Regularly and she does report exertional dyspnea that was worse in the last 3 days.  She also had severe headaches and thinks that her blood pressure was  elevated during this time.  She continues to have muscle aches that she attributes to Zetia.  It is better than when she was on statins but still bothersome.  She quit smoking after her stroke.   Past Medical History:  Diagnosis Date  . Allergy   . Anemia   . Cervical disc disorder with radiculopathy of cervical region 02/10/2016  . Chronic lower back pain   . Diverticulosis   . EKG, abnormal 01/06/2017  . Gastritis   . GERD (gastroesophageal reflux disease)   . H/O hiatal hernia   . Hiatal hernia   . History of amputation of lesser toe of left foot ([place]) 03/11/2015  . History of colon cancer   . Hypertension   . Hyperthyroidism    hx  . Hypothyroidism   . Migraine headache    "a few times/yr" (03/05/2015)  . Neck strain 02/06/2012  . Osteomyelitis ([place])    [name] 03/05/2015  . Osteomyelitis of toe of left foot ([place]) 03/05/2015  . Palpitations 01/05/2016  . Positive TB test   . Stroke ([place])   . Tubular adenoma of colon   . Tubular adenoma of colon     Past Surgical History:  Procedure Laterality Date  . ABDOMINAL HYSTERECTOMY  2006  . AMPUTATION TOE Left 03/05/2015   Procedure: AMPUTATION LEFT 5TH  TOE;  Surgeon: Wylene Simmer, MD;  Location: [facility];  Service: Orthopedics;  Laterality: Left;  . APPENDECTOMY  2006  . BREAST BIOPSY  Left 11/26/2014  . [place]  . COLONOSCOPY    . GALLBLADDER SURGERY    . LOOP RECORDER INSERTION N/A 09/18/2017   Procedure: LOOP RECORDER INSERTION;  Surgeon: Evans Lance, MD;  Location: [facility];  Service: Cardiovascular;  Laterality: N/A;  . TEE WITHOUT CARDIOVERSION N/A 09/18/2017   Procedure: TRANSESOPHAGEAL ECHOCARDIOGRAM (TEE);  Surgeon: Pixie Casino, MD;  [facility] ENDOS[facility];  Service: Cardiovascular;  Laterality: N/A;  . TOE AMPUTATION Left 03/05/2015   5th toe  . TUBAL LIGATION Bilateral 1990  . UPPER GASTROINTESTINAL ENDOSCOPY       Current Outpatient Medications  Medication Sig Dispense Refill  .  aspirin 81 MG chewable tablet Chew 1 tablet (81 mg total) by mouth daily. 30 tablet 0  . carvedilol (COREG) 12.5 MG tablet Take 1 tablet (12.5 mg total) by mouth 2 (two) times daily with a meal. 60 tablet 1  . cycloSPORINE (RESTASIS) 0.05 % ophthalmic emulsion Place 1 drop into both eyes 2 (two) times daily.     . hydrALAZINE (APRESOLINE) 50 MG tablet Take 50 mg by mouth 3 (three) times daily.    Marland Kitchen levothyroxine (SYNTHROID, LEVOTHROID) 112 MCG tablet TAKE ONE TABLET BY MOUTH ONCE DAILY BEFORE BREAKFAST 90 tablet 1  . mometasone (NASONEX) 50 MCG/ACT nasal spray Place 2 sprays into the nose daily. 17 g 12  . naphazoline-pheniramine (VISINE-A) 0.025-0.3 % ophthalmic solution Place 1 drop into both eyes 2 (two) times daily.    . Polyethylene Glycol 400 (BLINK TEARS OP) Place 1 drop into both eyes 4 (four) times daily as needed (dry eyes).    Marland Kitchen spironolactone (ALDACTONE) 50 MG tablet Take 1 tablet (50 mg total) by mouth daily. 90 tablet 1  . chlorthalidone (HYGROTON) 25 MG tablet Take 1 tablet (25 mg total) by mouth daily. 90 tablet 1   Current Facility-Administered Medications  Medication Dose Route Frequency Provider Last Rate Last Dose  . olopatadine (PATANOL) 0.1 % ophthalmic solution 1 drop  1 drop Both Eyes BID McDiarmid, Blane Ohara, MD        Allergies:   Bactrim [sulfamethoxazole-trimethoprim]; Clonidine derivatives; Amlodipine; Lisinopril; Statins; Aspirin; and Latex    Social History:  The patient  reports that she quit smoking about 3 months ago. Her smoking use included cigarettes. She has a 18.00 pack-year smoking history. She has never used smokeless tobacco. She reports that she does not drink alcohol or use drugs.   Family History:  The patient's family history includes Breast cancer in her maternal aunt and paternal aunt; Cancer (age of onset: 43) in her father; Diabetes in her brother, mother, other, and sister; Heart disease in her mother and other; Kidney disease in her brother and  sister; Stroke in her father.    ROS:  Please see the history of present illness.   Otherwise, review of systems are positive for neck pain.   All other systems are reviewed and negative.    PHYSICAL EXAM: VS:  BP (!) 188/99   Pulse 76   Ht 5\' 5"  (1.651 m)   Wt 202 lb (91.6 kg)   BMI 33.61 kg/m  , BMI Body mass index is 33.61 kg/m. GENERAL:  Well appearing HEENT: Pupils equal round and reactive, fundi not visualized, oral mucosa unremarkable NECK:  No jugular venous distention, waveform within normal limits, carotid upstroke brisk and symmetric, no bruits LUNGS:  Clear to auscultation bilaterally HEART:  RRR.  PMI not displaced or sustained,S1 and S2  within normal limits, no S3, no S4, no clicks, no rubs, II/VI systolic murmur at LUSB ABD:  Flat, positive bowel sounds normal in frequency in pitch, no bruits, no rebound, no guarding, no midline pulsatile mass, no hepatomegaly, no splenomegaly EXT:  2 plus pulses throughout, no edema, no cyanosis no clubbing SKIN:  No rashes no nodules NEURO:  Cranial nerves II through XII grossly intact, motor grossly intact throughout PSYCH:  Cognitively intact, oriented to person place and time   EKG:  EKG is not ordered today. The ekg ordered 02/11/16 demonstrates sinus bradycardia.  Rate 59 bpm.  Inferolateral TWI, concerning for inferolateral ischemia.  Unchanged from 03/09/15.  Lexiscan Myoview 02/24/16:  Nuclear stress EF: 47% by computer calculations. Visually , the EF is normal and is around 55-60%.  There was no ST segment deviation noted during stress.  The study is normal.  This is a low risk study.  Echo 09/16/17: Study Conclusions  - Left ventricle: The cavity size was normal. There was moderate   concentric hypertrophy. Systolic function was normal. The   estimated ejection fraction was in the range of 60% to 65%. Waiters   motion was normal; there were no regional Griffo motion   abnormalities. Doppler parameters are consistent  with abnormal   left ventricular relaxation (grade 1 diastolic dysfunction).   Doppler parameters are consistent with elevated ventricular   end-diastolic filling pressure. - Aortic valve: There was moderate regurgitation. - Mitral valve: Calcified annulus. Mildly thickened leaflets .   There was mild regurgitation. - Left atrium: The atrium was mildly dilated. - Right ventricle: Systolic function was normal. - Tricuspid valve: There was mild regurgitation. - Pulmonic valve: There was trivial regurgitation. - Pulmonary arteries: The main pulmonary artery was normal-sized.   Systolic pressure was within the normal range. - Inferior vena cava: The vessel was normal in size. - Pericardium, extracardiac: There was no pericardial effusion.   Recent Labs: 09/15/2017: ALT 21; TSH 2.407 09/18/2017: Hemoglobin 13.8; Platelets 248 12/22/2017: BUN 17; Creatinine, Ser 1.09; Potassium 4.3; Sodium 142    Lipid Panel    Component Value Date/Time   CHOL 171 09/15/2017 1217   TRIG 63 09/15/2017 1217   HDL 40 (L) 09/15/2017 1217   CHOLHDL 4.3 09/15/2017 1217   VLDL 13 09/15/2017 1217   LDLCALC 118 (H) 09/15/2017 1217      Wt Readings from Last 3 Encounters:  01/11/18 202 lb (91.6 kg)  12/22/17 200 lb (90.7 kg)  12/13/17 201 lb 9.6 oz (91.4 kg)      ASSESSMENT AND PLAN:  # Atypical chest pain: Resolved. Lexiscan Myoview was negative.   # Hypertension: BP is poorly controlled.  I suspect that this is the cause of her stroke.  Continue carvedilol.  We will switch hydralazine to 50 mg 3 times daily instead of 4 times daily.  She is having difficulty with adherence.  Add chlorthalidone 25 mg daily and check a basic metabolic panel in 1 week.  If her blood pressure remains greater than 130/80 in 1 week then she will increase hydralazine to 103 times daily.  Continue spironolactone.  # Embolic stroke: Idiopathic.  Loop recorder in place.  Hypertension and lipid management as above.   #  Hyperlipidemia: Intolerant of statins and now has myalgias on Zetia.  LDL goal <70. Will refer to PharmD for PCSK9 inhibitor.   # Chronic diastolic heart failure: Teresa [name] appears euvolemic on exam.  BP control as above.    # Palpitations: Improved.  #  OSA: Not severe enough for CPAP 03/2016.   Current medicines are reviewed at length with the patient today.  The patient does not have concerns regarding medicines.  The following changes have been made:  none  Labs/ tests ordered today include:   No orders of the defined types were placed in this encounter.    Disposition:   FU with Teresa [name] C. Oval Linsey, MD, [facility] in 3 months.  PharmD in 1 month for PCSK9 inhibitor and hypertension management.    Signed, Aryaa Bunting C. Oval Linsey, MD, [facility]  01/11/2018 12:59 PM    [place]

## 2018-01-12 ENCOUNTER — Other Ambulatory Visit: Payer: Self-pay

## 2018-01-12 ENCOUNTER — Ambulatory Visit (INDEPENDENT_AMBULATORY_CARE_PROVIDER_SITE_OTHER): Payer: BLUE CROSS/BLUE SHIELD | Admitting: Family Medicine

## 2018-01-12 ENCOUNTER — Encounter: Payer: Self-pay | Admitting: Physical Therapy

## 2018-01-12 ENCOUNTER — Encounter: Payer: Self-pay | Admitting: Family Medicine

## 2018-01-12 ENCOUNTER — Ambulatory Visit: Payer: BLUE CROSS/BLUE SHIELD | Admitting: Physical Therapy

## 2018-01-12 ENCOUNTER — Ambulatory Visit: Payer: BLUE CROSS/BLUE SHIELD | Admitting: Rehabilitation

## 2018-01-12 VITALS — BP 158/90 | HR 78 | Temp 98.2°F | Wt 200.0 lb

## 2018-01-12 DIAGNOSIS — M542 Cervicalgia: Secondary | ICD-10-CM | POA: Diagnosis not present

## 2018-01-12 DIAGNOSIS — R293 Abnormal posture: Secondary | ICD-10-CM

## 2018-01-12 DIAGNOSIS — G43909 Migraine, unspecified, not intractable, without status migrainosus: Secondary | ICD-10-CM | POA: Insufficient documentation

## 2018-01-12 DIAGNOSIS — H9202 Otalgia, left ear: Secondary | ICD-10-CM

## 2018-01-12 DIAGNOSIS — I10 Essential (primary) hypertension: Secondary | ICD-10-CM | POA: Diagnosis not present

## 2018-01-12 DIAGNOSIS — G43809 Other migraine, not intractable, without status migrainosus: Secondary | ICD-10-CM

## 2018-01-12 DIAGNOSIS — M25512 Pain in left shoulder: Secondary | ICD-10-CM

## 2018-01-12 DIAGNOSIS — M25511 Pain in right shoulder: Secondary | ICD-10-CM

## 2018-01-12 DIAGNOSIS — G8929 Other chronic pain: Secondary | ICD-10-CM

## 2018-01-12 MED ORDER — BUTALBITAL-APAP-CAFFEINE 50-325-40 MG PO TABS: 1.0000 | ORAL_TABLET | Freq: Four times a day (QID) | ORAL | 0 refills | Status: AC | PRN

## 2018-01-12 NOTE — Assessment & Plan Note (Signed)
Improved since last visit. Continue current regimen. Start Chlorthalidone as instructed by Cards. F/U in 4 weeks

## 2018-01-12 NOTE — Assessment & Plan Note (Signed)
No neurologic deficit. No sign of meningeal irritation. Currently asymptomatic. Start Fioricet as needed.

## 2018-01-12 NOTE — Therapy (Addendum)
[place], [place], [address] Phone: [phone]   Fax:  [phone]  Physical Therapy Treatment / discharge  Patient Details  Name: Teresa [name] MRN: [id number] Date of Birth: 01-Dec-1960 Referring Provider: [name], MD   Encounter Date: 01/12/2018  PT End of Session - 01/12/18 1036    Visit Number  11    Number of Visits  17    Date for PT Re-Evaluation  01/12/18    PT Start Time  0851 pt 6 min late    PT Stop Time  0929    PT Time Calculation (min)  38 min    Activity Tolerance  Patient tolerated treatment well    Behavior During Therapy  [facility] for tasks assessed/performed       Past Medical History:  Diagnosis Date  . Allergy   . Anemia   . Cervical disc disorder with radiculopathy of cervical region 02/10/2016  . Chronic lower back pain   . Diverticulosis   . EKG, abnormal 01/06/2017  . Gastritis   . GERD (gastroesophageal reflux disease)   . H/O hiatal hernia   . Hiatal hernia   . History of amputation of lesser toe of left foot ([place]) 03/11/2015  . History of colon cancer   . Hypertension   . Hyperthyroidism    hx  . Hypothyroidism   . Migraine headache    "a few times/yr" (03/05/2015)  . Neck strain 02/06/2012  . Osteomyelitis ([place])    [name] 03/05/2015  . Osteomyelitis of toe of left foot ([place]) 03/05/2015  . Palpitations 01/05/2016  . Positive TB test   . Stroke ([place])   . Tubular adenoma of colon   . Tubular adenoma of colon     Past Surgical History:  Procedure Laterality Date  . ABDOMINAL HYSTERECTOMY  2006  . AMPUTATION TOE Left 03/05/2015   Procedure: AMPUTATION LEFT 5TH  TOE;  Surgeon: Wylene Simmer, MD;  Location: [facility];  Service: Orthopedics;  Laterality: Left;  . APPENDECTOMY  2006  . BREAST BIOPSY Left 11/26/2014  . [place]  . COLONOSCOPY    . GALLBLADDER SURGERY    . LOOP RECORDER INSERTION N/A 09/18/2017   Procedure: LOOP RECORDER INSERTION;   Surgeon: Evans Lance, MD;  Location: [facility];  Service: Cardiovascular;  Laterality: N/A;  . TEE WITHOUT CARDIOVERSION N/A 09/18/2017   Procedure: TRANSESOPHAGEAL ECHOCARDIOGRAM (TEE);  Surgeon: Pixie Casino, MD;  Location[facility];  Service: Cardiovascular;  Laterality: N/A;  . TOE AMPUTATION Left 03/05/2015   5th toe  . TUBAL LIGATION Bilateral 1990  . UPPER GASTROINTESTINAL ENDOSCOPY      There were no vitals filed for this visit.  Subjective Assessment - 01/12/18 0854    Subjective  "I am not doing so well. I went to work on Tuesday and a migraine came out of no where. I am so tight."    Currently in Pain?  Yes    Pain Score  7     Pain Location  Neck tension    Pain Orientation  Right    Pain Descriptors / Indicators  -- tension    Pain Type  Chronic pain    Pain Onset  More than a month ago         [facility] PT Assessment - 01/12/18 0001      AROM   Cervical Flexion  50    Cervical Extension  48  Cervical - Right Side Bend  50    Cervical - Left Side Bend  46    Cervical - Right Rotation  50    Cervical - Left Rotation  70                   [facility] Adult PT Treatment/Exercise - 01/12/18 0001      Manual Therapy   Manual therapy comments  skilled palpation for TPDN utilized    Soft tissue mobilization  IASTM R upper trap and levator scap    McConnell  R upper trap inhibition taping       Trigger Point Dry Needling - 01/12/18 1032    Consent Given?  Yes    Education Handout Provided  No    Muscles Treated Upper Body  Upper trapezius;Levator scapulae;Suboccipitals muscle group cervical paraspinals; right side all muscles    Upper Trapezius Response  Twitch reponse elicited;Palpable increased muscle length    SubOccipitals Response  Twitch response elicited;Palpable increased muscle length    Levator Scapulae Response  Twitch response elicited;Palpable increased muscle length           PT Education - 01/12/18 1035    Education  provided  Yes    Education Details  reviewed TPDN, benefits of IASTM and importance of neck mobility    Person(s) Educated  Patient    Methods  Explanation    Comprehension  Verbalized understanding       PT Short Term Goals - 12/22/17 0908      PT SHORT TERM GOAL #1   Title  Pt will be independent with initial HEP in order to indicate improved function and decreased pain.  (Target Date: 12/13/17)    Baseline  given updated HEP    Time  4    Period  Weeks    Status  Achieved      PT SHORT TERM GOAL #2   Title  Will assess NDI and write appropriate LTG.       Baseline  not given within first/second visit    Time  4    Period  Weeks    Status  Deferred      PT SHORT TERM GOAL #3   Title  Will assess neck flexor endurance test and write appropriate LTG.     Baseline  was not performed within first week of care    Time  4    Status  Deferred      PT SHORT TERM GOAL #4   Title  Pt will improve ROM in all planes by 5 deg in order to indicate improved cervical mobility and ability to perform ADLs.     Baseline  met, see assessment section    Time  4    Period  Weeks    Status  Achieved      PT SHORT TERM GOAL #5   Title  Pt will improve L shoulder abd strength to 4/5 in order to indicate improved functional strength.     Baseline  5/5 on 12/22/17    Time  4    Period  Weeks    Status  Achieved      PT SHORT TERM GOAL #6   Title  Pt will report no more than 4/10 pain in neck/shoulders during mobility in order to indicate improved function.      Baseline  reports no pain, just some tightness    Time  4    Period  Weeks  Status  Achieved        PT Long Term Goals - 11/13/17 1942      PT LONG TERM GOAL #1   Title  Pt will be independent with final HEP in order to indicate improved function and decreased pain.  (Target Date: 01/12/18)    Time  8    Period  Weeks    Status  New    Target Date  01/12/18      PT LONG TERM GOAL #2   Title  Pt will improve cervical  rotation to 80 deg bilaterally with no more than 2/10 pain in order to indicate improved ability to complete ADLs.      Time  8    Period  Weeks    Status  New      PT LONG TERM GOAL #3   Title  Pt will report no more than 2/10 pain in order to indicate improved ability to carryout work duties.      Time  8    Period  Weeks    Status  New      PT LONG TERM GOAL #4   Title  Pt will report reduction in headaches by 50% in order to indicate improved ability to carryout work duties.     Time  8    Period  Weeks    Status  New            Plan - 01/12/18 1037    Clinical Impression Statement  Pt reports significant increase in pain upon arrival. She states she has had a headache since Tuesday. Treatment focused on TPDN of R upper trap, levator, cervical paraspinals, and suboccipitals. Following TPDN, performed IASTM to R upper trap and levator. Measured pt's cervical AROM at end of session; shows increased flexion, extension, and side bending. Pt reports decreased pain at end of session (5/10).     PT Treatment/Interventions  ADLs/Self Care Home Management;Moist Heat;Traction;Ultrasound;Functional mobility training;Therapeutic activities;Therapeutic exercise;Neuromuscular re-education;Patient/family education;Manual techniques;Passive range of motion;Dry needling;Taping    PT Next Visit Plan  reassessment, continue to empahsize posture; progress exercises as tolerated; continue with soft tissue mobilization. Rom, isometric and scapular exercises as pt tolerates.     PT Home Exercise Plan  Upper trap and levator stretches, TPDN education    Consulted and Agree with Plan of Care  Patient       Patient will benefit from skilled therapeutic intervention in order to improve the following deficits and impairments:  Decreased mobility, Decreased range of motion, Hypomobility, Increased muscle spasms, Impaired flexibility, Postural dysfunction, Improper body mechanics, Impaired UE functional use,  Pain  Visit Diagnosis: Cervicalgia  Abnormal posture  Chronic left shoulder pain  Chronic right shoulder pain     Problem List Patient Active Problem List   Diagnosis Date Noted  . Ear pain, left 12/13/2017  . Edema 11/07/2017  . Hyperlipidemia   . Cerebrovascular accident (CVA) due to embolism of cerebral artery ([place]) 09/15/2017  . History of cholecystectomy 06/27/2017  . Small intestinal bacterial overgrowth 03/23/2017  . Paresthesia 01/06/2017  . Prediabetes 10/29/2015  . Tendinopathy of left rotator cuff 10/27/2015  . Essential hypertension   . Right shoulder pain 04/15/2014  . Obesity 04/15/2014  . Cervicalgia 02/11/2014  . Lateral knee pain, left 08/06/2013  . Allergic rhinoconjunctivitis 07/19/2013  . Thyroid nodule 02/12/2013  . GERD (gastroesophageal reflux disease) 02/06/2012  . Tobacco abuse 09/21/2011  . Hypothyroidism 09/21/2011  . Throat pain 09/21/2011   Madison J.  Doyle Askew, SPT 01/12/18 12:01 PM   [place], [place], [address] Phone: [phone]   Fax:  [phone]  Name: Teresa [name] MRN: [id number] Date of Birth: [birth year]        PHYSICAL THERAPY DISCHARGE SUMMARY  Visits from Start of Care: 11  Current functional level related to goals / functional outcomes: See goals   Remaining deficits: unknown   Education / Equipment: HEP, theraband  Plan: Patient agrees to discharge.  Patient goals were partially met. Patient is being discharged due to not returning since the last visit.  ?????         Kristoffer Leamon PT, DPT, LAT, ATC  02/18/19  10:29 AM

## 2018-01-12 NOTE — Progress Notes (Signed)
Subjective:     Patient ID: Teresa [name], female   DOB: 1960/12/10, 57 y.o.   MRN: [id number]  HPI HTN:She is here for follow-up. She is currently on Coreg 12.5 mg BID, aldactone 50 mg qd. Cards switched her Hydralazine 50 mg QID to TID and started her on Chlorthalidone 25 mg qd. She is yet to pickup her Chlorthalidone.Home BP range: 147/90, 138/91, 150/91, 151/100, 180/110,181/100. Otalgia:Still endorsing left ear pain s/p antibiotic treatment. Headaches: C/O very bad headache few days ago. Light and noise borders her, there was also associated nausea, no vomiting.  She used Tylenol without any improvement. Her home muscle relaxant made her sleep. Headache resolved when she woke up.She has hx of migraine headache when she was younger. Last Migrain more than 1 yr ago. Denies headache currently.  Current Outpatient Medications on File Prior to Visit  Medication Sig Dispense Refill  . aspirin 81 MG chewable tablet Chew 1 tablet (81 mg total) by mouth daily. 30 tablet 0  . carvedilol (COREG) 12.5 MG tablet Take 1 tablet (12.5 mg total) by mouth 2 (two) times daily with a meal. 60 tablet 1  . chlorthalidone (HYGROTON) 25 MG tablet Take 1 tablet (25 mg total) by mouth daily. 90 tablet 1  . cycloSPORINE (RESTASIS) 0.05 % ophthalmic emulsion Place 1 drop into both eyes 2 (two) times daily.     . hydrALAZINE (APRESOLINE) 50 MG tablet Take 50 mg by mouth 3 (three) times daily.    Marland Kitchen levothyroxine (SYNTHROID, LEVOTHROID) 112 MCG tablet TAKE ONE TABLET BY MOUTH ONCE DAILY BEFORE BREAKFAST 90 tablet 1  . mometasone (NASONEX) 50 MCG/ACT nasal spray Place 2 sprays into the nose daily. 17 g 12  . naphazoline-pheniramine (VISINE-A) 0.025-0.3 % ophthalmic solution Place 1 drop into both eyes 2 (two) times daily.    . Polyethylene Glycol 400 (BLINK TEARS OP) Place 1 drop into both eyes 4 (four) times daily as needed (dry eyes).    Marland Kitchen spironolactone (ALDACTONE) 50 MG tablet Take 1 tablet (50 mg total) by mouth  daily. 90 tablet 1   Current Facility-Administered Medications on File Prior to Visit  Medication Dose Route Frequency Provider Last Rate Last Dose  . olopatadine (PATANOL) 0.1 % ophthalmic solution 1 drop  1 drop Both Eyes BID McDiarmid, Blane Ohara, MD       Past Medical History:  Diagnosis Date  . Allergy   . Anemia   . Cervical disc disorder with radiculopathy of cervical region 02/10/2016  . Chronic lower back pain   . Diverticulosis   . EKG, abnormal 01/06/2017  . Gastritis   . GERD (gastroesophageal reflux disease)   . H/O hiatal hernia   . Hiatal hernia   . History of amputation of lesser toe of left foot ([place]) 03/11/2015  . History of colon cancer   . Hypertension   . Hyperthyroidism    hx  . Hypothyroidism   . Migraine headache    "a few times/yr" (03/05/2015)  . Neck strain 02/06/2012  . Osteomyelitis ([place])    [name] 03/05/2015  . Osteomyelitis of toe of left foot ([place]) 03/05/2015  . Palpitations 01/05/2016  . Positive TB test   . Stroke ([place])   . Tubular adenoma of colon   . Tubular adenoma of colon     Vitals:   01/12/18 0948  BP: (!) 158/90  Pulse: 78  Temp: 98.2 F (36.8 C)  TempSrc: Oral  SpO2: 99%  Weight: 200 lb (90.7 kg)  Review of Systems  Respiratory: Negative.   Cardiovascular: Negative.   Gastrointestinal: Negative.   Neurological: Negative.   All other systems reviewed and are negative.      Objective:   Physical Exam  Constitutional: She is oriented to person, place, and time. She appears well-developed. No distress.  HENT:  Right Ear: Tympanic membrane and ear canal normal.  Left Ear: Ear canal normal.  Cardiovascular: Normal rate, regular rhythm and normal heart sounds.  No murmur heard. Pulmonary/Chest: Effort normal and breath sounds normal. No stridor. No respiratory distress. She has no wheezes.  Abdominal: Soft. Bowel sounds are normal. She exhibits no mass. There is no tenderness.  Musculoskeletal: Normal range of motion. She  exhibits no edema.  Neurological: She is alert and oriented to person, place, and time. She displays normal reflexes. No cranial nerve deficit or sensory deficit. Coordination normal.  Psychiatric: She has a normal mood and affect.  Nursing note and vitals reviewed.      Assessment:     HTN Otalgia Headache: Hx of Migraine    Plan:     Check problem list

## 2018-01-12 NOTE — Patient Instructions (Signed)
Blood Pressure Record Sheet Your blood pressure on this visit to the emergency department or clinic is elevated. This does not necessarily mean you have high blood pressure (hypertension), but it does mean that your blood pressure needs to be rechecked. Many times your blood pressure can increase due to illness, pain, anxiety, or other factors. We recommend that you get a series of blood pressure readings done over a period of 5 days. It is best to get a reading in the morning and one in the evening. You should make sure to sit and relax for 1-5 minutes before the reading is taken. Write the readings down and make a follow-up appointment with your health care provider to discuss the results. If there is not a free clinic or a drug store with a blood-pressure-taking machine near you, you can purchase blood-pressure-taking equipment from a drug store. Having one in the home allows you the convenience of taking your blood pressure while you are home and relaxed. Blood Pressure Log Date: _______________________  a.m. _____________________  p.m. _____________________  Date: _______________________  a.m. _____________________  p.m. _____________________  Date: _______________________  a.m. _____________________  p.m. _____________________  Date: _______________________  a.m. _____________________  p.m. _____________________  Date: _______________________  a.m. _____________________  p.m. _____________________  This information is not intended to replace advice given to you by your health care provider. Make sure you discuss any questions you have with your health care provider. Document Released: 03/12/2003 Document Revised: 05/27/2016 Document Reviewed: 08/06/2013 Elsevier Interactive Patient Education  2018 [employer]  

## 2018-01-12 NOTE — Assessment & Plan Note (Signed)
Etiology unclear. No erythema on exam today. I still recommended ENT eval. Referral placed again.

## 2018-01-17 ENCOUNTER — Ambulatory Visit: Payer: BLUE CROSS/BLUE SHIELD | Admitting: Rehabilitative and Restorative Service Providers"

## 2018-01-17 ENCOUNTER — Encounter: Payer: Self-pay | Admitting: Rehabilitative and Restorative Service Providers"

## 2018-01-17 VITALS — BP 180/93

## 2018-01-17 DIAGNOSIS — M542 Cervicalgia: Secondary | ICD-10-CM

## 2018-01-17 DIAGNOSIS — R293 Abnormal posture: Secondary | ICD-10-CM

## 2018-01-17 NOTE — Patient Instructions (Signed)
Access Code: [email or web address]  URL: [email or web address]  Date: 01/17/2018  Prepared by: Rudell Cobb   Exercises Hooklying Upper Neck Extension - 10 reps - 1 sets - 1x daily - 7x weekly Seated Assisted Cervical Rotation with Towel - 3 reps - 1 sets - 1x daily - 7x weekly Seated Cervical Traction - 10 reps - 3 sets - 1x daily - 7x weekly

## 2018-01-17 NOTE — Therapy (Signed)
[facility] [address] [place], [place], [address] Phone: [phone]   Fax:  [phone]  Physical Therapy Treatment  Patient Details  Name: Teresa [name] MRN: [id number] Date of Birth: 1960-08-30 Referring Provider: [name], MD   Encounter Date: 01/17/2018  PT End of Session - 01/17/18 0857    Visit Number  12    Number of Visits  17    Date for PT Re-Evaluation  01/12/18    Authorization Type  BCBS    Authorization - Visit Number  12    Authorization - Number of Visits  30    PT Start Time  0076    PT Stop Time  0934    PT Time Calculation (min)  47 min    Activity Tolerance  Patient tolerated treatment well    Behavior During Therapy  [facility] for tasks assessed/performed       Past Medical History:  Diagnosis Date  . Allergy   . Anemia   . Cervical disc disorder with radiculopathy of cervical region 02/10/2016  . Chronic lower back pain   . Diverticulosis   . EKG, abnormal 01/06/2017  . Gastritis   . GERD (gastroesophageal reflux disease)   . H/O hiatal hernia   . Hiatal hernia   . History of amputation of lesser toe of left foot ([place]) 03/11/2015  . History of colon cancer   . Hypertension   . Hyperthyroidism    hx  . Hypothyroidism   . Migraine headache    "a few times/yr" (03/05/2015)  . Neck strain 02/06/2012  . Osteomyelitis ([place])    [name] 03/05/2015  . Osteomyelitis of toe of left foot ([place]) 03/05/2015  . Palpitations 01/05/2016  . Positive TB test   . Stroke ([place])   . Tubular adenoma of colon   . Tubular adenoma of colon     Past Surgical History:  Procedure Laterality Date  . ABDOMINAL HYSTERECTOMY  2006  . AMPUTATION TOE Left 03/05/2015   Procedure: AMPUTATION LEFT 5TH  TOE;  Surgeon: Wylene Simmer, MD;  Location: [facility];  Service: Orthopedics;  Laterality: Left;  . APPENDECTOMY  2006  . BREAST BIOPSY Left 11/26/2014  . [place]  . COLONOSCOPY    . GALLBLADDER  SURGERY    . LOOP RECORDER INSERTION N/A 09/18/2017   Procedure: LOOP RECORDER INSERTION;  Surgeon: Evans Lance, MD;  Location: [facility];  Service: Cardiovascular;  Laterality: N/A;  . TEE WITHOUT CARDIOVERSION N/A 09/18/2017   Procedure: TRANSESOPHAGEAL ECHOCARDIOGRAM (TEE);  Surgeon: Pixie Casino, MD;  Location: [facility] ENDOSCOPY;  Service: Cardiovascular;  Laterality: N/A;  . TOE AMPUTATION Left 03/05/2015   5th toe  . TUBAL LIGATION Bilateral 1990  . UPPER GASTROINTESTINAL ENDOSCOPY      Vitals:   01/17/18 0853  BP: (!) 180/93    Subjective Assessment - 01/17/18 0853    Subjective  The patient notes that she has regressed in the past week.  She discusses bilateral tightness since a migraine earlier this week.  "Feels like something needs to be popped."  PT inquired about BP meds b/c patient's BP high today (checked due to recent HA).  Patient notes it has been running high and recently had new medication added.  PT proceeded to treat because no other symptoms and patient to monitor.  Our session focused on reducing neck tightness, improving mobility.    Pertinent History  History of old CVA with L  sided weakness.      Patient Stated Goals  "to get rid of this pain and tightness"     Currently in Pain?  Yes    Pain Score  6     Pain Location  Neck    Pain Orientation  Right;Left    Pain Descriptors / Indicators  Tightness;Sore    Pain Type  Chronic pain    Pain Onset  More than a month ago    Pain Frequency  Constant    Aggravating Factors   working; tighter this week    Pain Relieving Factors  rest, heat, exercise, stretches         [facility] PT Assessment - 01/17/18 0902      AROM   Cervical - Right Rotation  70 at end of session=85 degrees    Cervical - Left Rotation  70 at end of session=85 degrees                   [facility] Adult PT Treatment/Exercise - 01/17/18 0854      Exercises   Exercises  Neck      Neck Exercises: Standing   Other Standing  Exercises  Attempted UE abduction + scapular retraction with back to doorframe, however patient arches low back to accomplish movement.      Neck Exercises: Seated   Other Seated Exercise  Self mobilization using a towel for gentle traction + rotation; and then cervical rotation.      Neck Exercises: Supine   Neck Retraction  10 reps    Cervical Rotation  Right;Left;10 reps    Cervical Rotation Limitations  PROM with overpressure and end range isometrics for neuromuscular facilitation    Shoulder Flexion  5 reps;Both    Shoulder Flexion Limitations  Combined with upper thoracic extension over towel roll with isometric holds    Other Supine Exercise  Mild extension over towel roll adding neck rotation with holding in comvfortable ROM.  Patient notes this signifiantly reduces pain.      Neck Exercises: Prone   Axial Exension  5 reps    Axial Extension Limitations  prone on elbows with axial extension and manual resistance provided.    Other Prone Exercise  shoulder abduction to 90 + scapular retraction R and L sides x 10 reps (thumb leading/ elbow extended)      Manual Therapy   Manual Therapy  Joint mobilization;Soft tissue mobilization;Manual Traction;Muscle Energy Technique    Manual therapy comments  Goal of manual therapy is to reduce muscle tension, improve ROM, decrease "tightness and pain"    Joint Mobilization  PRONE:  P>A grade II-III mobilization of C6, C7, T1, T2, T3  SUPINE:  upglides/downglides of mid and lower cervical spine; combined ROM and joint mobilization taking to right rotation and performing L upglides and L rotation with R upglides grade II-III.  Patient notes reduced pain with mobilization.    Soft tissue mobilization  paraspinal musculature R and L upper cervical muscles, suboccipitals, scalenes    Manual Traction  Supine manual traction with 30 second hold x 5 reps    Muscle Energy Technique  resisted cervical retraction             PT Education - 01/17/18  1213    Education provided  Yes    Education Details  Added neck extension with rotation over towel roll; self mobilization into neck traction/extension + rotation and cervical rotation    Person(s) Educated  Patient  Methods  Explanation;Handout;Demonstration    Comprehension  Verbalized understanding;Returned demonstration       PT Short Term Goals - 12/22/17 0908      PT SHORT TERM GOAL #1   Title  Pt will be independent with initial HEP in order to indicate improved function and decreased pain.  (Target Date: 12/13/17)    Baseline  given updated HEP    Time  4    Period  Weeks    Status  Achieved      PT SHORT TERM GOAL #2   Title  Will assess NDI and write appropriate LTG.       Baseline  not given within first/second visit    Time  4    Period  Weeks    Status  Deferred      PT SHORT TERM GOAL #3   Title  Will assess neck flexor endurance test and write appropriate LTG.     Baseline  was not performed within first week of care    Time  4    Status  Deferred      PT SHORT TERM GOAL #4   Title  Pt will improve ROM in all planes by 5 deg in order to indicate improved cervical mobility and ability to perform ADLs.     Baseline  met, see assessment section    Time  4    Period  Weeks    Status  Achieved      PT SHORT TERM GOAL #5   Title  Pt will improve L shoulder abd strength to 4/5 in order to indicate improved functional strength.     Baseline  5/5 on 12/22/17    Time  4    Period  Weeks    Status  Achieved      PT SHORT TERM GOAL #6   Title  Pt will report no more than 4/10 pain in neck/shoulders during mobility in order to indicate improved function.      Baseline  reports no pain, just some tightness    Time  4    Period  Weeks    Status  Achieved        PT Long Term Goals - 01/17/18 1215      PT LONG TERM GOAL #1   Title  Pt will be independent with final HEP in order to indicate improved function and decreased pain.  (Target Date: 02/16/2018)     Baseline  PT added further HEP today for self mobilization.  *Still needs further review prior to d/c.  *Modified target date.    Time  8    Period  Weeks    Status  Revised    Target Date  02/16/18      PT LONG TERM GOAL #2   Title  Pt will improve cervical rotation to 80 deg bilaterally with no more than 2/10 pain in order to indicate improved ability to complete ADLs.      Baseline  This week, patient with more tightness.  Had 70 deg bilat cervical rotation at beginning of session and 85 degrees at end of session.    Time  8    Period  Weeks    Status  Achieved      PT LONG TERM GOAL #3   Title  Pt will report no more than 2/10 pain in order to indicate improved ability to carryout work duties.      Baseline  Revised target date.  Pain  increased this week.  Session beginning rated 6/10 and then 0/10 at end.    Time  8    Period  Weeks    Status  Revised    Target Date  02/16/18      PT LONG TERM GOAL #4   Title  Pt will report reduction in headaches by 50% in order to indicate improved ability to carryout work duties.     Time  8    Period  Weeks    Status  Revised    Target Date  02/16/18            Plan - 01/17/18 1226    Clinical Impression Statement  The patient met  1 long term goal.  She had a decline in status last week with increased tightness and migraine HA.  She responds well to mobilization and strengthening activities in therapy.  PT to continue to work towards unmet LTGs as patient has not been seen for anticipated # of visits.  PT discussed performing exercise as needed during work schedule due to job requirements/demands on posture/neck muscles.     PT Treatment/Interventions  ADLs/Self Care Home Management;Moist Heat;Traction;Ultrasound;Functional mobility training;Therapeutic activities;Therapeutic exercise;Neuromuscular re-education;Patient/family education;Manual techniques;Passive range of motion;Dry needling;Taping    PT Next Visit Plan  *work on posture  during work activities (retraction of neck and shoulders), soft tissue mobilization, end range isometrics with retraction, scapular strengthening, postural stabilization.    Consulted and Agree with Plan of Care  Patient       Patient will benefit from skilled therapeutic intervention in order to improve the following deficits and impairments:  Decreased mobility, Decreased range of motion, Hypomobility, Increased muscle spasms, Impaired flexibility, Postural dysfunction, Improper body mechanics, Impaired UE functional use, Pain  Visit Diagnosis: Cervicalgia  Abnormal posture     Problem List Patient Active Problem List   Diagnosis Date Noted  . Migraine 01/12/2018  . Ear pain, left 12/13/2017  . Edema 11/07/2017  . Hyperlipidemia   . Cerebrovascular accident (CVA) due to embolism of cerebral artery ([place]) 09/15/2017  . History of cholecystectomy 06/27/2017  . Small intestinal bacterial overgrowth 03/23/2017  . Paresthesia 01/06/2017  . Prediabetes 10/29/2015  . Tendinopathy of left rotator cuff 10/27/2015  . Essential hypertension   . Right shoulder pain 04/15/2014  . Obesity 04/15/2014  . Cervicalgia 02/11/2014  . Lateral knee pain, left 08/06/2013  . Allergic rhinoconjunctivitis 07/19/2013  . Thyroid nodule 02/12/2013  . GERD (gastroesophageal reflux disease) 02/06/2012  . Tobacco abuse 09/21/2011  . Hypothyroidism 09/21/2011  . Throat pain 09/21/2011    Teresa [name], PT 01/17/2018, 12:29 PM  [facility] [address] [place], [place], [address] Phone: [phone]   Fax:  [phone]  Name: Teresa [name] MRN: [id number] Date of Birth: 1960/09/12

## 2018-01-19 ENCOUNTER — Ambulatory Visit: Payer: BLUE CROSS/BLUE SHIELD | Admitting: Rehabilitation

## 2018-01-19 ENCOUNTER — Encounter

## 2018-01-19 ENCOUNTER — Encounter: Payer: Self-pay | Admitting: Rehabilitation

## 2018-01-19 VITALS — BP 178/90

## 2018-01-19 DIAGNOSIS — M542 Cervicalgia: Secondary | ICD-10-CM

## 2018-01-19 DIAGNOSIS — R293 Abnormal posture: Secondary | ICD-10-CM

## 2018-01-19 NOTE — Therapy (Signed)
[facility] [address] [place], [place], [address] Phone: [phone]   Fax:  [phone]  Physical Therapy Treatment  Patient Details  Name: Ethell Blatchford MRN: [id number] Date of Birth: [birth year] Referring Provider: [name], MD   Encounter Date: 01/19/2018  PT End of Session - 01/19/18 1234    Visit Number  13    Number of Visits  17 per updated POC    Date for PT Re-Evaluation  02/16/18 per updated POC    Authorization Type  BCBS    Authorization - Visit Number  13    Authorization - Number of Visits  30    PT Start Time  [phone]    PT Stop Time  0930    PT Time Calculation (min)  41 min    Activity Tolerance  Patient tolerated treatment well    Behavior During Therapy  [facility] for tasks assessed/performed       Past Medical History:  Diagnosis Date  . Allergy   . Anemia   . Cervical disc disorder with radiculopathy of cervical region 02/10/2016  . Chronic lower back pain   . Diverticulosis   . EKG, abnormal 01/06/2017  . Gastritis   . GERD (gastroesophageal reflux disease)   . H/O hiatal hernia   . Hiatal hernia   . History of amputation of lesser toe of left foot ([place]) 03/11/2015  . History of colon cancer   . Hypertension   . Hyperthyroidism    hx  . Hypothyroidism   . Migraine headache    "a few times/yr" (03/05/2015)  . Neck strain 02/06/2012  . Osteomyelitis ([place])    [name] 03/05/2015  . Osteomyelitis of toe of left foot ([place]) 03/05/2015  . Palpitations 01/05/2016  . Positive TB test   . Stroke ([place])   . Tubular adenoma of colon   . Tubular adenoma of colon     Past Surgical History:  Procedure Laterality Date  . ABDOMINAL HYSTERECTOMY  2006  . AMPUTATION TOE Left 03/05/2015   Procedure: AMPUTATION LEFT 5TH  TOE;  Surgeon: Wylene Simmer, MD;  Location: [facility];  Service: Orthopedics;  Laterality: Left;  . APPENDECTOMY  2006  . BREAST BIOPSY Left 11/26/2014  . [place]  .  COLONOSCOPY    . GALLBLADDER SURGERY    . LOOP RECORDER INSERTION N/A 09/18/2017   Procedure: LOOP RECORDER INSERTION;  Surgeon: Evans Lance, MD;  Location: [place] [facility];  Service: Cardiovascular;  Laterality: N/A;  . TEE WITHOUT CARDIOVERSION N/A 09/18/2017   Procedure: TRANSESOPHAGEAL ECHOCARDIOGRAM (TEE);  Surgeon: Pixie Casino, MD;  Location: [facility] ENDOSCOPY;  Service: Cardiovascular;  Laterality: N/A;  . TOE AMPUTATION Left 03/05/2015   5th toe  . TUBAL LIGATION Bilateral 1990  . UPPER GASTROINTESTINAL ENDOSCOPY      Vitals:   01/19/18 1233  BP: (!) 178/90    Subjective Assessment - 01/19/18 0852    Subjective  Pt reports feeling somewhat better since last week.  Reports she was out in heat too long.      Pertinent History  History of old CVA with L sided weakness.      Limitations  House hold activities    How long can you stand comfortably?  5 hours at work without pain    Patient Stated Goals  "to get rid of this pain and tightness"     Currently in Pain?  Yes    Pain Score  4     Pain Location  Neck    Pain Orientation  Right;Left    Pain Descriptors / Indicators  Tightness    Pain Type  Chronic pain    Pain Onset  More than a month ago    Pain Frequency  Constant    Aggravating Factors   working    Pain Relieving Factors  rest, heat, exercises, stretches                       [facility] Adult PT Treatment/Exercise - 01/19/18 0915      Neck Exercises: Standing   Other Standing Exercises  Standing shoulder extension with emphasis on scapular retraction/depression with red theraband x 10 reps with tactile cues for posture.      Other Standing Exercises  Standing pectoral stretch in corner with forearms on [place] x 3 reps of 45 secs.       Manual Therapy   Manual Therapy  Joint mobilization;Soft tissue mobilization;Myofascial release;Manual Traction;Muscle Energy Technique    Manual therapy comments  Goal of manual therapy is to reduce muscle tension,  improve ROM, decrease "tightness and pain"    Joint Mobilization  Prone PA grade II/III mobilization to entire cervical and upper thoracic spine (thoracic spine x 2 reps due to decreased mobility noted).  Supine upglide opposite side rotating towards (downglide on same side) x 3 reps with 5 sec hold to each side-mobilization with movement.  Performed Grade V upper thoracic manipulation with slight cavitation noted and pt reporting decreased tightness.  Supine with PT supporting head off mat providing traction, retraction and extension with overpressure x 5 reps with light mobs into end range.  Pt tolerated well with reports of 0/10 pain following treatment.     Soft tissue mobilization  paraspinal musculature R and L upper cervical muscles, suboccipitals, scalenes    Myofascial Release  bil cervical paraspinals, upper traps, scalenes and rhomboids, first rib mobilization     Manual Traction  see above with traction/retraction/extension    Muscle Energy Technique  resisted cervical rotation into end range x 3 reps on each side with 5 sec holds.              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     Person(s) Educated  Patient    Methods  Explanation    Comprehension  Verbalized understanding       PT Short Term Goals - 12/22/17 0908      PT SHORT TERM GOAL #1   Title  Pt will be independent with initial HEP in order to indicate improved function and decreased pain.  (Target Date: 12/13/17)    Baseline  given updated HEP    Time  4    Period  Weeks    Status  Achieved      PT SHORT TERM GOAL #2   Title  Will assess NDI and write appropriate LTG.       Baseline  not given within first/second visit    Time  4    Period  Weeks    Status  Deferred      PT SHORT TERM GOAL #3   Title  Will assess neck flexor endurance test and write appropriate LTG.     Baseline  was not  performed within first week of care    Time  4    Status  Deferred      PT SHORT TERM GOAL #4   Title  Pt will improve ROM in all planes by 5 deg in order to indicate improved cervical mobility and ability to perform ADLs.     Baseline  met, see assessment section    Time  4    Period  Weeks    Status  Achieved      PT SHORT TERM GOAL #5   Title  Pt will improve L shoulder abd strength to 4/5 in order to indicate improved functional strength.     Baseline  5/5 on 12/22/17    Time  4    Period  Weeks    Status  Achieved      PT SHORT TERM GOAL #6   Title  Pt will report no more than 4/10 pain in neck/shoulders during mobility in order to indicate improved function.      Baseline  reports no pain, just some tightness    Time  4    Period  Weeks    Status  Achieved        PT Long Term Goals - 01/17/18 1215      PT LONG TERM GOAL #1   Title  Pt will be independent with final HEP in order to indicate improved function and decreased pain.  (Target Date: 02/16/2018)    Baseline  PT added further HEP today for self mobilization.  *Still needs further review prior to d/c.  *Modified target date.    Time  8    Period  Weeks    Status  Revised    Target Date  02/16/18      PT LONG TERM GOAL #2   Title  Pt will improve cervical rotation to 80 deg bilaterally with no more than 2/10 pain in order to indicate improved ability to complete ADLs.      Baseline  This week, patient with more tightness.  Had 70 deg bilat cervical rotation at beginning of session and 85 degrees at end of session.    Time  8    Period  Weeks    Status  Achieved      PT LONG TERM GOAL #3   Title  Pt will report no more than 2/10 pain in order to indicate improved ability to carryout work duties.      Baseline  Revised target date.  Pain increased this week.  Session beginning rated 6/10 and then 0/10 at end.    Time  8    Period  Weeks    Status  Revised    Target Date  02/16/18      PT LONG TERM GOAL #4    Title  Pt will report reduction in headaches by 50% in order to indicate improved ability to carryout work duties.     Time  8    Period  Weeks    Status  Revised    Target Date  02/16/18            Plan - 01/19/18 1236    Clinical Impression Statement  Pt reports feeling better since last session, however has conitnued tightness in upper trap and throughout paraspinal muscles.  Continues to respond very well  to manual therapy and strengthening with reports of 0/10 tightness following session.  Will plan to get her back into dry needling as she felt this helped decreased tightness.      PT Treatment/Interventions  ADLs/Self Care Home Management;Moist Heat;Traction;Ultrasound;Functional mobility training;Therapeutic activities;Therapeutic exercise;Neuromuscular re-education;Patient/family education;Manual techniques;Passive range of motion;Dry needling;Taping    PT Next Visit Plan  *work on posture during work activities (retraction of neck and shoulders), soft tissue mobilization, end range isometrics with retraction, scapular strengthening, postural stabilization.    Consulted and Agree with Plan of Care  Patient       Patient will benefit from skilled therapeutic intervention in order to improve the following deficits and impairments:  Decreased mobility, Decreased range of motion, Hypomobility, Increased muscle spasms, Impaired flexibility, Postural dysfunction, Improper body mechanics, Impaired UE functional use, Pain  Visit Diagnosis: Cervicalgia  Abnormal posture     Problem List Patient Active Problem List   Diagnosis Date Noted  . Migraine 01/12/2018  . Ear pain, left 12/13/2017  . Edema 11/07/2017  . Hyperlipidemia   . Cerebrovascular accident (CVA) due to embolism of cerebral artery ([place]) 09/15/2017  . History of cholecystectomy 06/27/2017  . Small intestinal bacterial overgrowth 03/23/2017  . Paresthesia 01/06/2017  . Prediabetes 10/29/2015  . Tendinopathy of  left rotator cuff 10/27/2015  . Essential hypertension   . Right shoulder pain 04/15/2014  . Obesity 04/15/2014  . Cervicalgia 02/11/2014  . Lateral knee pain, left 08/06/2013  . Allergic rhinoconjunctivitis 07/19/2013  . Thyroid nodule 02/12/2013  . GERD (gastroesophageal reflux disease) 02/06/2012  . Tobacco abuse 09/21/2011  . Hypothyroidism 09/21/2011  . Throat pain 09/21/2011    Cameron Sprang, PT, MPT [facility] [address] [place], [place], [address] Phone: [phone]   Fax:  [phone] 01/19/18, 12:44 PM  Name: Shanine Kreiger MRN: [id number] Date of Birth: 11/07/1960

## 2018-01-23 ENCOUNTER — Ambulatory Visit (INDEPENDENT_AMBULATORY_CARE_PROVIDER_SITE_OTHER): Payer: BLUE CROSS/BLUE SHIELD | Admitting: Adult Health

## 2018-01-23 ENCOUNTER — Encounter: Payer: Self-pay | Admitting: Adult Health

## 2018-01-23 VITALS — BP 130/74 | HR 72 | Ht 65.0 in | Wt 200.0 lb

## 2018-01-23 DIAGNOSIS — E785 Hyperlipidemia, unspecified: Secondary | ICD-10-CM

## 2018-01-23 DIAGNOSIS — I10 Essential (primary) hypertension: Secondary | ICD-10-CM | POA: Diagnosis not present

## 2018-01-23 DIAGNOSIS — I6340 Cerebral infarction due to embolism of unspecified cerebral artery: Secondary | ICD-10-CM

## 2018-01-23 LAB — CUP PACEART REMOTE DEVICE CHECK
Date Time Interrogation Session: 20190629220852
Implantable Pulse Generator Implant Date: 20190325

## 2018-01-23 NOTE — Patient Instructions (Signed)
Continue aspirin 81 mg daily  and zetia  for secondary stroke prevention  Continue to follow up with cardiologist regarding PSCK9 injection and other cardiac conditions   Continue to follow up with PCP regarding cholesterol and blood pressure management   Continue to stay active and eat healthy  Continue to monitor blood pressure at home  Maintain strict control of hypertension with blood pressure goal below 130/90, diabetes with hemoglobin A1c goal below 6.5% and cholesterol with LDL cholesterol (bad cholesterol) goal below 70 mg/dL. I also advised the patient to eat a healthy diet with plenty of whole grains, cereals, fruits and vegetables, exercise regularly and maintain ideal body weight.  Followup in the future with me in 6 months or call earlier if needed       Thank you for coming to see [place] at [facility]. I hope we have been able to provide you high quality care today.  You may receive a patient satisfaction survey over the next few weeks. We would appreciate your feedback and comments so that we may continue to improve ourselves and the health of our patients.

## 2018-01-23 NOTE — Progress Notes (Signed)
STROKE NEUROLOGY FOLLOW UP NOTE  NAME: Teresa [name] DOB: 09/27/1960  REASON FOR VISIT: stroke follow up HISTORY FROM: pt and chart  Today we had the pleasure of seeing Teresa [name] in follow-up at our Neurology Clinic. Pt was accompanied by no one.   History Summary Teresa [name] is a 57 y.o. L handed female with history of palpitations, positive TB test, Graves' disease s/p radiation on synthroid, migraines, colon cancer, and smoker admitted on 09/15/17 for left-sided weakness, blurred vision, ataxia, and elevated BP.   MRI showed multiple small acute infarct involving the right MCA, right MCA/ACA, left MCA/PCA, left cerebellum.  Also had chronic CMBs likely due to chronic hypertension.  CT head and neck unremarkable.  TTE EF 60 to 65%.  TEE no source of emboli, no PFO.  Loop recorder placed.  LDL 118 and A1c 6.0.  Hypercoagulable and autoimmune work-up negative.  BP high during admission, discharged on Coreg, hydralazine and spironolactone.  Encouraged to quit smoking.  She was discharged with aspirin and Lipitor.  10/24/2017 visit  JX: During the interval time, the patient has been doing well.  At neuro baseline.  However complains of frequent leg cramping, she stopped hydralazine and Lipitor by herself due to leg cramping.  BP high today 175/97.  She agrees to resume hydralazine. We will change Lipitor to pravastatin.  Loop recorder so far no A. fib.  She has quit smoking 3 weeks ago.  Patient complains of neck pain at the back of the neck, consistent with cervical radiculopathy.  Patient would like to have physical therapy for that.  Interval history: Patient returns today for routine follow-up visit.  From a stroke standpoint she has been doing well.  She states that she has been working on regularly and walking daily along with maintaining a healthy diet.  She has completely quit smoking for the past 4 months.  She continues to take aspirin without bleeding or bruising.  Her cardiologist  has been adjusting statin medications due to possible myalgias and recently recommended starting PCSK9 inhibitor.  She has not started at this time nor has heard anything from the office regarding starting this medication.  Advised patient that it may take a little while to begin this due to needing prior authorizations but advised to call office regarding the start of this as she continues to complain of muscle cramps on the Zetia.  Blood pressure today satisfactory 130/74.  Patient is mainly complaining at today's appointment of dizziness and shortness of breath that has been consistent and not improving.  Patient states she is frustrated because she continues to tell providers about complaints of dizziness and shortness of breath but continues not to have this worked up or have interventions done for this.  Patient was mildly upset during appointment with myself due to not performing different studies to evaluate shortness of breath complaints.  Patient was advised to continue to follow with cardiologist/PCP regarding these complaints as they are not stroke/neurological related and if they do worsen patient was advised to call 911 or be seen in the ED.  Loop recorder intact and has not shown atrial fibrillation thus far.  Denies new or worsening stroke/TIA symptoms.     REVIEW OF SYSTEMS: Full 14 system review of systems performed and notable only for those listed below and in HPI above, all others are negative:  Constitutional:   Cardiovascular: Leg swelling Ear/Nose/Throat: Ear pain Skin:  Eyes: Eye redness, light sensitivity and eye pain Respiratory: SOB Gastroitestinal:  Diarrhea Genitourinary:  Hematology/Lymphatic:   Endocrine:  Musculoskeletal: Muscle cramps, neck pain and neck stiffness Allergy/Immunology:   Neurological: Headache Psychiatric:  Sleep:   The following represents the patient's updated allergies and side effects list: Allergies  Allergen Reactions  . Bactrim  [Sulfamethoxazole-Trimethoprim] Anaphylaxis  . Clonidine Derivatives Swelling and Other (See Comments)    Facial swelling  . Amlodipine Swelling    Leg swelling  . Lisinopril Swelling    Lip swelling  . Statins Other (See Comments)    Body and muscle aches on Statin (Lipitor, Pravachol)  . Aspirin Nausea Only  . Latex Rash    The neurologically relevant items on the patient's problem list were reviewed on today's visit.  Neurologic Examination  A problem focused neurological exam (12 or more points of the single system neurologic examination, vital signs counts as 1 point, cranial nerves count for 8 points) was performed.  Blood pressure 130/74, pulse 72, height 5\' 5"  (1.651 m), weight 200 lb (90.7 kg).  General - Well nourished, well developed, middle-aged African-American female, in no apparent distress.  Ophthalmologic - Sharp disc margins OU.   Cardiovascular - Regular rate and rhythm  Mental Status -  Level of arousal and orientation to time, place, and person were intact. Language including expression, naming, repetition, comprehension was assessed and found intact. Attention span and concentration were normal. Fund of Knowledge was assessed and was intact.  Cranial Nerves II - XII - II - Visual field intact OU. III, IV, VI - Extraocular movements intact.  V - Facial sensation intact bilaterally. VII - Facial movement intact bilaterally. VIII - Hearing & vestibular intact bilaterally. X - Palate elevates symmetrically. XI - Chin turning & shoulder shrug intact bilaterally. XII - Tongue protrusion intact.  Motor Strength - The patient's strength was normal in all extremities and pronator drift was absent.  Bulk was normal and fasciculations were absent.   Motor Tone - Muscle tone was assessed at the neck and appendages and was normal.  Reflexes - The patient's reflexes were 1+ in all extremities and she had no pathological reflexes.  Sensory - Light touch,  temperature/pinprick, vibration and proprioception, and Romberg testing were assessed and were normal.    Coordination - The patient had normal movements in the hands and feet with no ataxia or dysmetria.  Tremor was absent.  Gait and Station - The patient's transfers, posture, gait, station, and turns were observed as normal.     Data reviewed: I personally reviewed the images and agree with the radiology interpretations.  Ct Angio Head W Or Wo Contrast Ct Angio Neck W Or Wo Contrast 09/15/2017 IMPRESSION:  1. No emergent large vessel occlusion or hemodynamically significant stenosis of the arteries of the head and neck.  2. Minimal bilateral carotid bifurcation atherosclerotic calcification without stenosis.   Ct Head Wo Contrast 09/15/2017 IMPRESSION:  No acute intracranial abnormality. Mild ethmoid sinusitis.   Mr Brain Wo Contrast 09/15/2017 IMPRESSION:  Multiple small acute infarcts involving the right MCA territory, left PCA territory, and left PICA territory. Findings suggest multiple emboli. Chronic microhemorrhage right posterior temporal lobe and right thalamus possibly due to hypertension.   Transthoracic Echocardiogram - Left ventricle: The cavity size was normal. There was moderate concentric hypertrophy. Systolic function was normal. Theestimated ejection fraction was in the range of 60% to 65%. Wallmotion was normal; there were no regional Piasecki motionabnormalities. Doppler parameters are consistent with abnormalleft ventricular relaxation (grade 1 diastolic dysfunction).Doppler parameters are consistent with elevated ventricularend-diastolic filling  pressure. - Aortic valve: There was moderate regurgitation. - Mitral valve: Calcified annulus. Mildly thickened leaflets .There was mild regurgitation. - Left atrium: The atrium was mildly dilated. - Right ventricle: Systolic function was normal. - Tricuspid valve: There was mild regurgitation. - Pulmonic valve:  There was trivial regurgitation. - Pulmonary arteries: The main pulmonary artery was normal-sized.Systolic pressure was within the normal range. - Inferior vena cava: The vessel was normal in size. - Pericardium, extracardiac: There was no pericardial effusion.  TEE 1. No LAA thrombus 2. Negative for PFO 3. Mild LAE 4. Trace to mild MR 5. Mild central AI 6. LVEF 60-65%  Component     Latest Ref Rng & Units 09/15/2017 09/18/2017  Cholesterol     0 - 200 mg/dL 171   Triglycerides     <150 mg/dL 63   HDL Cholesterol     >40 mg/dL 40 (L)   Total CHOL/HDL Ratio     RATIO 4.3   VLDL     0 - 40 mg/dL 13   LDL (calc)     0 - 99 mg/dL 118 (H)   PTT Lupus Anticoagulant     0.0 - 51.9 sec  36.9  DRVVT     0.0 - 47.0 sec  38.5  Lupus Anticoag Interp       Comment:  Beta-2 Glycoprotein I Ab, IgG     0 - 20 GPI IgG units  <9  Beta-2-Glycoprotein I IgM     0 - 32 GPI IgM units  <9  Beta-2-Glycoprotein I IgA     0 - 25 GPI IgA units  <9  Anticardiolipin Ab,IgG,Qn     0 - 14 GPL U/mL  <9  Anticardiolipin Ab,IgM,Qn     0 - 12 MPL U/mL  12  Anticardiolipin Ab,IgA,Qn     0 - 11 APL U/mL  <9  Hemoglobin A1C     4.8 - 5.6 % 6.0 (H)   Mean Plasma Glucose     mg/dL 125.5   TSH     0.350 - 4.500 uIU/mL 2.407   T4,Free(Direct)     0.61 - 1.12 ng/dL 1.18 (H)   ds DNA Ab     0 - 9 IU/mL  1  ANA Ab, IFA       Negative  Sickle Cell Screen     Negative  Negative  Sed Rate     0 - 22 mm/hr  20  CRP     <1.0 mg/dL  0.8  Homocysteine     0.0 - 15.0 umol/L  12.3    Assessment: Cariana Karge is a 57 year old female with cryptogenic multiple infarcts stroke on 09/15/2017.  Vascular risk factors include HTN and HLD.  Loop recorder was placed during hospitalization.   Plan:  -Continue aspirin and Zetia at this time for secondary stroke prevention follow-up with cardiology in regards to prescribing PCSK9 inhibitor due to statin intolerance -Patient was advised to follow with  cardiology/PCP in regards to continued complaints of dizziness and shortness of breath - continue to monitor loop recorder - Follow up with your primary care physician for stroke risk factor modification. Recommend maintain blood pressure goal <130/80, diabetes with hemoglobin A1c goal below 7.0% and lipids with LDL cholesterol goal below 70 mg/dL.  - check BP at home and record and adjust BP meds if needed - healthy diet and regular exercise  Follow-up in 6 months or call earlier if needed  I spent more than 25 minutes of  face to face time with the patient. Greater than 50% of time was spent in counseling and coordination of care. We discussed BP control, medication change, and loop recorder monitoring  Venancio Poisson, AGNP-BC  [facility] [address] [place], [place] [address]  Phone [phone] Fax [phone] Note: This document was prepared with digital dictation and possible smart phrase technology. Any transcriptional errors that result from this process are unintentional.

## 2018-01-24 ENCOUNTER — Ambulatory Visit: Payer: BLUE CROSS/BLUE SHIELD | Admitting: Physical Therapy

## 2018-01-24 ENCOUNTER — Encounter: Payer: Self-pay | Admitting: Physical Therapy

## 2018-01-24 VITALS — BP 144/97

## 2018-01-24 DIAGNOSIS — M542 Cervicalgia: Secondary | ICD-10-CM

## 2018-01-24 DIAGNOSIS — R293 Abnormal posture: Secondary | ICD-10-CM

## 2018-01-25 ENCOUNTER — Ambulatory Visit (INDEPENDENT_AMBULATORY_CARE_PROVIDER_SITE_OTHER): Payer: BLUE CROSS/BLUE SHIELD | Admitting: *Deleted

## 2018-01-25 DIAGNOSIS — I639 Cerebral infarction, unspecified: Secondary | ICD-10-CM

## 2018-01-25 NOTE — Therapy (Signed)
[facility] [address] [place], [place], [address] Phone: [phone]   Fax:  [phone]  Physical Therapy Treatment  Patient Details  Name: Teresa [name] MRN: [id number] Date of Birth: 1960-08-30 Referring Provider: [name], MD   Encounter Date: 01/24/2018  PT End of Session - 01/24/18 0851    Visit Number  14    Number of Visits  17 per updated POC    Date for PT Re-Evaluation  02/16/18 per updated POC    Authorization Type  [employer]    Authorization - Visit Number  14    Authorization - Number of Visits  30    PT Start Time  0848    PT Stop Time  0929    PT Time Calculation (min)  41 min    Activity Tolerance  Patient tolerated treatment well    Behavior During Therapy  [facility] for tasks assessed/performed       Past Medical History:  Diagnosis Date  . Allergy   . Anemia   . Cervical disc disorder with radiculopathy of cervical region 02/10/2016  . Chronic lower back pain   . Diverticulosis   . EKG, abnormal 01/06/2017  . Gastritis   . GERD (gastroesophageal reflux disease)   . H/O hiatal hernia   . Hiatal hernia   . History of amputation of lesser toe of left foot ([place]) 03/11/2015  . History of colon cancer   . Hypertension   . Hyperthyroidism    hx  . Hypothyroidism   . Migraine headache    "a few times/yr" (03/05/2015)  . Neck strain 02/06/2012  . Osteomyelitis ([place])    [name] 03/05/2015  . Osteomyelitis of toe of left foot ([place]) 03/05/2015  . Palpitations 01/05/2016  . Positive TB test   . Stroke ([place])   . Tubular adenoma of colon   . Tubular adenoma of colon     Past Surgical History:  Procedure Laterality Date  . ABDOMINAL HYSTERECTOMY  2006  . AMPUTATION TOE Left 03/05/2015   Procedure: AMPUTATION LEFT 5TH  TOE;  Surgeon: Wylene Simmer, MD;  Location: [facility];  Service: Orthopedics;  Laterality: Left;  . APPENDECTOMY  2006  . BREAST BIOPSY Left 11/26/2014  . [place]  .  COLONOSCOPY    . GALLBLADDER SURGERY    . LOOP RECORDER INSERTION N/A 09/18/2017   Procedure: LOOP RECORDER INSERTION;  Surgeon: Evans Lance, MD;  Location: [facility] CV LAB;  Service: Cardiovascular;  Laterality: N/A;  . TEE WITHOUT CARDIOVERSION N/A 09/18/2017   Procedure: TRANSESOPHAGEAL ECHOCARDIOGRAM (TEE);  Surgeon: Pixie Casino, MD;  Location: [facility] ENDOSCOPY;  Service: Cardiovascular;  Laterality: N/A;  . TOE AMPUTATION Left 03/05/2015   5th toe  . TUBAL LIGATION Bilateral 1990  . UPPER GASTROINTESTINAL ENDOSCOPY      Vitals:   01/24/18 0850  BP: (!) 144/97    Subjective Assessment - 01/24/18 0850    Subjective  Reports feeling sluggish today. Did move some furniture around on Monday and feels that may be why she is tired today. Reports neck continues to improve slowly, tight today with no pain.     Pertinent History  History of old CVA with L sided weakness.      Limitations  House hold activities    How long can you stand comfortably?  5 hours at work without pain    Patient Stated Goals  "to get rid of this pain and tightness"  Currently in Pain?  No/denies    Pain Score  0-No pain           [facility] Adult PT Treatment/Exercise - 01/24/18 0855      Neck Exercises: Theraband   Scapula Retraction  10 reps;Red;Limitations 5 sec holds each rep    Scapula Retraction Limitations  cues for posture and hold times, performed in standing    Shoulder Extension  10 reps;Red;Limitations 5 sec hold each rep    Shoulder Extension Limitations  cues on posture and hold times, performed in standing    Horizontal ABduction  10 reps;Red;Limitations    Horizontal ABduction Limitations  cues on posture, to relax shoulders down (scapular depression), performed in sitting    Other Theraband Exercises  with red theraband: alternating diagonal pulls x 10 each direction with red band, cues on posture and slow, controlled movements.       Shoulder Exercises: Therapy Ball   Flexion   Both;10 reps;Limitations    Flexion Limitations  rolling ball up Scruggs for flexion stretch x 10 reps      Manual Therapy   Manual Therapy  Soft tissue mobilization;Myofascial release;Manual Traction;Neural Stretch    Manual therapy comments  all manual therapy was performed for decreased tightness, decreased muscle tension and for improved ROM    Soft tissue mobilization  paraspinal musculature R and L upper cervical muscles, suboccipitals, scalenes    Myofascial Release  bil cervical paraspinals, upper traps, scalenes and rhomboids, first rib mobilization     Manual Traction  suboccipital release for 30 sec's x 5 reps; gentle cervical distraction, progressing to gentle cervical distraction with overpressure to shoulder for scapular depression for increased muscular stretching, performed this to both sides.                              Muscle Energy Technique  gentle cervical distraction concurrent with scapular retraction x 10 reps, then concurrent with scapular depression x 10 reps for postural correction, muscular stretching.                                     PT Short Term Goals - 12/22/17 0908      PT SHORT TERM GOAL #1   Title  Pt will be independent with initial HEP in order to indicate improved function and decreased pain.  (Target Date: 12/13/17)    Baseline  given updated HEP    Time  4    Period  Weeks    Status  Achieved      PT SHORT TERM GOAL #2   Title  Will assess NDI and write appropriate LTG.       Baseline  not given within first/second visit    Time  4    Period  Weeks    Status  Deferred      PT SHORT TERM GOAL #3   Title  Will assess neck flexor endurance test and write appropriate LTG.     Baseline  was not performed within first week of care    Time  4    Status  Deferred      PT SHORT TERM GOAL #4   Title  Pt will improve ROM in all planes by 5 deg in order to indicate improved cervical mobility and ability to perform ADLs.     Baseline  met, see  assessment section    Time  4    Period  Weeks    Status  Achieved      PT SHORT TERM GOAL #5   Title  Pt will improve L shoulder abd strength to 4/5 in order to indicate improved functional strength.     Baseline  5/5 on 12/22/17    Time  4    Period  Weeks    Status  Achieved      PT SHORT TERM GOAL #6   Title  Pt will report no more than 4/10 pain in neck/shoulders during mobility in order to indicate improved function.      Baseline  reports no pain, just some tightness    Time  4    Period  Weeks    Status  Achieved        PT Long Term Goals - 01/17/18 1215      PT LONG TERM GOAL #1   Title  Pt will be independent with final HEP in order to indicate improved function and decreased pain.  (Target Date: 02/16/2018)    Baseline  PT added further HEP today for self mobilization.  *Still needs further review prior to d/c.  *Modified target date.    Time  8    Period  Weeks    Status  Revised    Target Date  02/16/18      PT LONG TERM GOAL #2   Title  Pt will improve cervical rotation to 80 deg bilaterally with no more than 2/10 pain in order to indicate improved ability to complete ADLs.      Baseline  This week, patient with more tightness.  Had 70 deg bilat cervical rotation at beginning of session and 85 degrees at end of session.    Time  8    Period  Weeks    Status  Achieved      PT LONG TERM GOAL #3   Title  Pt will report no more than 2/10 pain in order to indicate improved ability to carryout work duties.      Baseline  Revised target date.  Pain increased this week.  Session beginning rated 6/10 and then 0/10 at end.    Time  8    Period  Weeks    Status  Revised    Target Date  02/16/18      PT LONG TERM GOAL #4   Title  Pt will report reduction in headaches by 50% in order to indicate improved ability to carryout work duties.     Time  8    Period  Weeks    Status  Revised    Target Date  02/16/18            Plan - 01/24/18 3557    Clinical  Impression Statement  Today's skilled session continued to focus on cervical pain/muscular tightness and gentle strengthening with pt reporting feeling better at the end of the session with decreased tightness. Pt continues to make slow, steady progress toward goals and should benefit from continued PT to progress toward unmet goals.                        PT Treatment/Interventions  ADLs/Self Care Home Management;Moist Heat;Traction;Ultrasound;Functional mobility training;Therapeutic activities;Therapeutic exercise;Neuromuscular re-education;Patient/family education;Manual techniques;Passive range of motion;Dry needling;Taping    PT Next Visit Plan  *work on posture during work activities (retraction of neck and shoulders), soft tissue mobilization, end  range isometrics with retraction, scapular strengthening, postural stabilization.    Consulted and Agree with Plan of Care  Patient       Patient will benefit from skilled therapeutic intervention in order to improve the following deficits and impairments:  Decreased mobility, Decreased range of motion, Hypomobility, Increased muscle spasms, Impaired flexibility, Postural dysfunction, Improper body mechanics, Impaired UE functional use, Pain  Visit Diagnosis: Cervicalgia  Abnormal posture     Problem List Patient Active Problem List   Diagnosis Date Noted  . Migraine 01/12/2018  . Ear pain, left 12/13/2017  . Edema 11/07/2017  . Hyperlipidemia   . Cerebrovascular accident (CVA) due to embolism of cerebral artery ([place]) 09/15/2017  . History of cholecystectomy 06/27/2017  . Small intestinal bacterial overgrowth 03/23/2017  . Paresthesia 01/06/2017  . Prediabetes 10/29/2015  . Tendinopathy of left rotator cuff 10/27/2015  . Essential hypertension   . Right shoulder pain 04/15/2014  . Obesity 04/15/2014  . Cervicalgia 02/11/2014  . Lateral knee pain, left 08/06/2013  . Allergic rhinoconjunctivitis 07/19/2013  . Thyroid nodule  02/12/2013  . GERD (gastroesophageal reflux disease) 02/06/2012  . Tobacco abuse 09/21/2011  . Hypothyroidism 09/21/2011  . Throat pain 09/21/2011    Willow Ora, PTA, [facility] [address], [place], [place] [address] [phone] 01/25/18, 1:11 PM   Name: Teresa [name] MRN: [id number] Date of Birth: [birth year]

## 2018-01-26 NOTE — Progress Notes (Signed)
[facility] Summary Report / Loop Recorder 

## 2018-01-29 ENCOUNTER — Encounter: Payer: Self-pay | Admitting: Family Medicine

## 2018-01-31 ENCOUNTER — Ambulatory Visit: Payer: BLUE CROSS/BLUE SHIELD | Admitting: Rehabilitative and Restorative Service Providers"

## 2018-01-31 ENCOUNTER — Other Ambulatory Visit: Payer: Self-pay | Admitting: Family Medicine

## 2018-01-31 DIAGNOSIS — E039 Hypothyroidism, unspecified: Secondary | ICD-10-CM

## 2018-02-04 ENCOUNTER — Emergency Department (HOSPITAL_COMMUNITY): Payer: BLUE CROSS/BLUE SHIELD

## 2018-02-04 ENCOUNTER — Encounter (HOSPITAL_COMMUNITY): Payer: Self-pay | Admitting: Emergency Medicine

## 2018-02-04 ENCOUNTER — Emergency Department (HOSPITAL_BASED_OUTPATIENT_CLINIC_OR_DEPARTMENT_OTHER): Payer: BLUE CROSS/BLUE SHIELD

## 2018-02-04 ENCOUNTER — Emergency Department (HOSPITAL_COMMUNITY)
Admission: EM | Admit: 2018-02-04 | Discharge: 2018-02-04 | Disposition: A | Discharge status: Home / Self Care | Payer: BLUE CROSS/BLUE SHIELD | Attending: Emergency Medicine | Admitting: Emergency Medicine

## 2018-02-04 DIAGNOSIS — Z7982 Long term (current) use of aspirin: Secondary | ICD-10-CM | POA: Diagnosis not present

## 2018-02-04 DIAGNOSIS — M25572 Pain in left ankle and joints of left foot: Secondary | ICD-10-CM | POA: Diagnosis not present

## 2018-02-04 DIAGNOSIS — M79609 Pain in unspecified limb: Secondary | ICD-10-CM | POA: Diagnosis not present

## 2018-02-04 DIAGNOSIS — E039 Hypothyroidism, unspecified: Secondary | ICD-10-CM | POA: Insufficient documentation

## 2018-02-04 DIAGNOSIS — Z8673 Personal history of transient ischemic attack (TIA), and cerebral infarction without residual deficits: Secondary | ICD-10-CM | POA: Insufficient documentation

## 2018-02-04 DIAGNOSIS — Z91040 Latex allergy status: Secondary | ICD-10-CM | POA: Insufficient documentation

## 2018-02-04 DIAGNOSIS — Z85038 Personal history of other malignant neoplasm of large intestine: Secondary | ICD-10-CM | POA: Insufficient documentation

## 2018-02-04 DIAGNOSIS — M79662 Pain in left lower leg: Secondary | ICD-10-CM

## 2018-02-04 DIAGNOSIS — Z79899 Other long term (current) drug therapy: Secondary | ICD-10-CM | POA: Insufficient documentation

## 2018-02-04 DIAGNOSIS — G8929 Other chronic pain: Secondary | ICD-10-CM | POA: Diagnosis not present

## 2018-02-04 DIAGNOSIS — Z87891 Personal history of nicotine dependence: Secondary | ICD-10-CM | POA: Insufficient documentation

## 2018-02-04 LAB — BASIC METABOLIC PANEL
Anion gap: 7 (ref 5–15)
BUN: 14 mg/dL (ref 6–20)
CO2: 27 mmol/L (ref 22–32)
Calcium: 9.6 mg/dL (ref 8.9–10.3)
Chloride: 105 mmol/L (ref 98–111)
Creatinine, Ser: 1.08 mg/dL — ABNORMAL HIGH (ref 0.44–1.00)
GFR calc Af Amer: >60 mL/min (ref >60)
GFR calc non Af Amer: 57 mL/min — ABNORMAL LOW (ref >60)
Glucose, Bld: 98 mg/dL (ref 70–99)
Potassium: 4.1 mmol/L (ref 3.5–5.1)
Sodium: 139 mmol/L (ref 135–145)

## 2018-02-04 LAB — CBC
HCT: 44.3 % (ref 36.0–46.0)
Hemoglobin: 13.7 g/dL (ref 12.0–15.0)
MCH: 25.8 pg — ABNORMAL LOW (ref 26.0–34.0)
MCHC: 30.9 g/dL (ref 30.0–36.0)
MCV: 83.3 fL (ref 78.0–100.0)
Platelets: 318 10*3/uL (ref 150–400)
RBC: 5.32 MIL/uL — ABNORMAL HIGH (ref 3.87–5.11)
RDW: 14 % (ref 11.5–15.5)
WBC: 7.4 10*3/uL (ref 4.0–10.5)

## 2018-02-04 LAB — CK: Total CK: 119 U/L (ref 38–234)

## 2018-02-04 MED ORDER — OXYCODONE-ACETAMINOPHEN 5-325 MG PO TABS
1.0000 | ORAL_TABLET | Freq: Once | ORAL | Status: AC
  Administered 2018-02-04: 1 via ORAL
  Filled 2018-02-04: qty 1

## 2018-02-04 MED ORDER — HYDROCODONE-ACETAMINOPHEN 5-325 MG PO TABS: 1.0000 | ORAL_TABLET | Freq: Four times a day (QID) | ORAL | 0 refills | Status: DC | PRN

## 2018-02-04 NOTE — Progress Notes (Signed)
VASCULAR LAB PRELIMINARY  PRELIMINARY  PRELIMINARY  PRELIMINARY  Left lower extremity venous duplex completed.    Preliminary report:  There is no DVT or SVT noted in the left lower extremity.   Rosibel Giacobbe, RVT 02/04/2018, 9:58 AM

## 2018-02-04 NOTE — ED Notes (Signed)
Pt states that she walks everyday, has noticed increasing pain in her left posterior knee. States that the last time this happened she had a hairline fracture.

## 2018-02-04 NOTE — ED Provider Notes (Signed)
[place] [facility] EMERGENCY DEPARTMENT Provider Note   CSN: [id number] Arrival date & time: 02/04/18  0841     History   Chief Complaint Chief Complaint  Patient presents with  . Leg Pain    HPI Teresa [name] is a 57 y.o. female.  HPI   Teresa [name] is a 57yo female with a history of CVA, hypertension, hyperlipidemia, obesity, prediabetes, hypothyroidism who presents to the emergency department for evaluation of left ankle and calf pain.  Patient states that she has had left anterior ankle pain which radiates to the lateral calf over the last 3 weeks now.  Pain has progressively worsened over the past 3 days.  She denies inciting injury or trauma.  States that she has had similar pain in the past and was diagnosed with a hairline fibular fracture.  She reports that pain is severe and feels "like a spasm."  It is worsened with ankle movement and with weightbearing.  She has noticed intermittent swelling, but not like this currently.  She has been elevating the left foot and applying ice without relief.  She has not tried taking any over-the-counter medications.  She states that her doctors have been changing her medication for hyperlipidemia due to joint pain aching and has been on 3 different medications over the past several months.  She denies fever, chills, redness, rash, numbness, weakness, arthralgias/myalgias elsewhere, chest pain, shortness of breath.  No history of DVT/PE, recent surgery or immobilization, exogenous estrogen or active cancer.  She is able to ambulate independently despite pain.  Past Medical History:  Diagnosis Date  . Allergy   . Anemia   . Cervical disc disorder with radiculopathy of cervical region 02/10/2016  . Chronic lower back pain   . Diverticulosis   . EKG, abnormal 01/06/2017  . Gastritis   . GERD (gastroesophageal reflux disease)   . H/O hiatal hernia   . Hiatal hernia   . History of amputation of lesser toe of left foot ([place]) 03/11/2015  .  History of colon cancer   . Hypertension   . Hyperthyroidism    hx  . Hypothyroidism   . Migraine headache    "a few times/yr" (03/05/2015)  . Neck strain 02/06/2012  . Osteomyelitis ([place])    [name] 03/05/2015  . Osteomyelitis of toe of left foot ([place]) 03/05/2015  . Palpitations 01/05/2016  . Positive TB test   . Stroke ([place])   . Tubular adenoma of colon   . Tubular adenoma of colon     Patient Active Problem List   Diagnosis Date Noted  . Migraine 01/12/2018  . Ear pain, left 12/13/2017  . Edema 11/07/2017  . Hyperlipidemia   . Cerebrovascular accident (CVA) due to embolism of cerebral artery ([place]) 09/15/2017  . History of cholecystectomy 06/27/2017  . Small intestinal bacterial overgrowth 03/23/2017  . Paresthesia 01/06/2017  . Prediabetes 10/29/2015  . Tendinopathy of left rotator cuff 10/27/2015  . Essential hypertension   . Right shoulder pain 04/15/2014  . Obesity 04/15/2014  . Cervicalgia 02/11/2014  . Lateral knee pain, left 08/06/2013  . Allergic rhinoconjunctivitis 07/19/2013  . Thyroid nodule 02/12/2013  . GERD (gastroesophageal reflux disease) 02/06/2012  . Tobacco abuse 09/21/2011  . Hypothyroidism 09/21/2011  . Throat pain 09/21/2011    Past Surgical History:  Procedure Laterality Date  . ABDOMINAL HYSTERECTOMY  2006  . AMPUTATION TOE Left 03/05/2015   Procedure: AMPUTATION LEFT 5TH  TOE;  Surgeon: Wylene Simmer, MD;  Location: [facility];  Service:  Orthopedics;  Laterality: Left;  . APPENDECTOMY  2006  . BREAST BIOPSY Left 11/26/2014  . [place]  . COLONOSCOPY    . GALLBLADDER SURGERY    . LOOP RECORDER INSERTION N/A 09/18/2017   Procedure: LOOP RECORDER INSERTION;  Surgeon: Evans Lance, MD;  Location: [facility];  Service: Cardiovascular;  Laterality: N/A;  . TEE WITHOUT CARDIOVERSION N/A 09/18/2017   Procedure: TRANSESOPHAGEAL ECHOCARDIOGRAM (TEE);  Surgeon: Pixie Casino, MD;  Location: [facility];  Service:  Cardiovascular;  Laterality: N/A;  . TOE AMPUTATION Left 03/05/2015   5th toe  . TUBAL LIGATION Bilateral 1990  . UPPER GASTROINTESTINAL ENDOSCOPY       OB History   None      Home Medications    Prior to Admission medications   Medication Sig Start Date End Date Taking? Authorizing Provider  aspirin 81 MG chewable tablet Chew 1 tablet (81 mg total) by mouth daily. 09/19/17   Sherene Sires, DO  butalbital-acetaminophen-caffeine (FIORICET, ESGIC) 50-325-40 MG tablet Take 1 tablet by mouth every 6 (six) hours as needed for headache or migraine. 01/12/18 01/12/19  Kinnie Feil, MD  carvedilol (COREG) 12.5 MG tablet Take 1 tablet (12.5 mg total) by mouth 2 (two) times daily with a meal. 12/06/17   Kinnie Feil, MD  chlorthalidone (HYGROTON) 25 MG tablet Take 1 tablet (25 mg total) by mouth daily. 01/11/18   Skeet Latch, MD  cycloSPORINE (RESTASIS) 0.05 % ophthalmic emulsion Place 1 drop into both eyes 2 (two) times daily.     [provider]  ezetimibe (ZETIA) 10 MG tablet Take 10 mg by mouth daily.    [provider]  hydrALAZINE (APRESOLINE) 50 MG tablet Take 50 mg by mouth 3 (three) times daily.    [provider]  levothyroxine (SYNTHROID, LEVOTHROID) 112 MCG tablet TAKE ONE TABLET BY MOUTH ONCE DAILY BEFORE BREAKFAST 01/04/18   Eniola, Phill Myron, MD  naphazoline-pheniramine (VISINE-A) 0.025-0.3 % ophthalmic solution Place 1 drop into both eyes 2 (two) times daily.    [provider]  spironolactone (ALDACTONE) 50 MG tablet Take 1 tablet (50 mg total) by mouth daily. 01/04/18   Kinnie Feil, MD    Family History Family History  Problem Relation Age of Onset  . Heart disease Mother        CAB age 67  . Diabetes Mother   . Cancer Father 39       pancreatic cancer  . Stroke Father   . Diabetes Sister   . Kidney disease Sister        renal failure  . Diabetes Brother   . Kidney disease Brother        renal failure  . Heart disease  Other   . Diabetes Other   . Breast cancer Maternal Aunt        per pt aunts and uncles died of cancer not sure the type  . Breast cancer Paternal Aunt   . Esophageal cancer Neg Hx   . Rectal cancer Neg Hx   . Stomach cancer Neg Hx     Social History Social History   Tobacco Use  . Smoking status: Former Smoker    Packs/day: 0.50    Years: 36.00    Pack years: 18.00    Types: Cigarettes    Last attempt to quit: 09/23/2017    Years since quitting: 0.3  . Smokeless tobacco: Never Used  . Tobacco comment: Tobacco  info given 03/01/17  Substance Use Topics  . Alcohol use: No  . Drug use: No     Allergies   Bactrim [sulfamethoxazole-trimethoprim]; Clonidine derivatives; Amlodipine; Lisinopril; Statins; Aspirin; and Latex   Review of Systems Review of Systems  Constitutional: Negative for chills and fever.  Respiratory: Negative for shortness of breath.   Cardiovascular: Negative for chest pain.  Gastrointestinal: Negative for vomiting.  Musculoskeletal: Positive for arthralgias (left ankle) and myalgias (left calf). Negative for gait problem.  Skin: Negative for color change, rash and wound.  Neurological: Negative for weakness and numbness.  Psychiatric/Behavioral: Negative for agitation.  All other systems reviewed and are negative.    Physical Exam Updated Vital Signs BP (!) 143/84 (BP Location: Left Arm)   Pulse 72   Temp 98.5 F (36.9 C) (Oral)   Resp 16   SpO2 97%   Physical Exam  Constitutional: She is oriented to person, place, and time. She appears well-developed and well-nourished. No distress.  HENT:  Head: Normocephalic and atraumatic.  Eyes: Right eye exhibits no discharge. Left eye exhibits no discharge.  Neck: Normal range of motion.  Cardiovascular: Normal rate, regular rhythm and intact distal pulses.  Pulmonary/Chest: Effort normal and breath sounds normal. No stridor. No respiratory distress. She has no wheezes. She has no rales.    Musculoskeletal:       Feet:  No tenderness over the medial or lateral malleolus. No tenderness over the navicular bone. Achilles intact. No swelling over the calf. Full ROM of the left knee. DP pulses 2+ bilaterally. Distal sensation to light touch intact in bilateral LE.   Neurological: She is alert and oriented to person, place, and time. Coordination normal.  Skin: Skin is warm and dry. Capillary refill takes less than 2 seconds. She is not diaphoretic.  Psychiatric: She has a normal mood and affect. Her behavior is normal.  Nursing note and vitals reviewed.    ED Treatments / Results  Labs (all labs ordered are listed, but only abnormal results are displayed) Labs Reviewed  BASIC METABOLIC PANEL - Abnormal; Notable for the following components:      Result Value   Creatinine, Ser 1.08 (*)    GFR calc non Af Amer 57 (*)    All other components within normal limits  CBC - Abnormal; Notable for the following components:   RBC 5.32 (*)    MCH 25.8 (*)    All other components within normal limits  CK    EKG None  Radiology Dg Tibia/fibula Left  Result Date: 02/04/2018 CLINICAL DATA:  6 months left lower leg pain for the past. The patient was told that she had a left knee hairline fracture 6 months ago. EXAM: LEFT TIBIA AND FIBULA - 2 VIEW COMPARISON:  Left ankle radiographs obtained today. Left knee radiographs dated 10/28/2016. FINDINGS: Stable corticated fragmentation of the anterior tibial tubercle. Stable mild left knee degenerative changes. No fracture or dislocation seen. IMPRESSION: No acute abnormality. Stable mild left knee degenerative changes. Electronically Signed   By: Claudie Revering M.D.   On: 02/04/2018 11:40   Dg Ankle Complete Left  Result Date: 02/04/2018 CLINICAL DATA:  Left lower leg and ankle pain for the past 6 months. EXAM: LEFT ANKLE COMPLETE - 3+ VIEW COMPARISON:  12/19/2013. FINDINGS: Diffuse soft tissue swelling, most pronounced laterally. No effusion.  Calcaneal spurs. IMPRESSION: Soft tissue swelling without underlying bony abnormality. Electronically Signed   By: Claudie Revering M.D.   On: 02/04/2018 11:42  Procedures Procedures (including critical care time)  Medications Ordered in ED Medications  oxyCODONE-acetaminophen (PERCOCET/ROXICET) 5-325 MG per tablet 1 tablet (1 tablet Oral Given 02/04/18 1054)     Initial Impression / Assessment and Plan / ED Course  I have reviewed the triage vital signs and the nursing notes.  Pertinent labs & imaging results that were available during my care of the patient were reviewed by me and considered in my medical decision making (see chart for details).     Presents with left ankle spasms radiating to the left calf.  No inciting injury.  On exam left lower extremity neurovascularly intact, no concern for arterial occlusion or ischemic limb.  No swelling, warmth, erythema to suggest infectious process.  She does report recently being on a statin. Venous doppler study negative for DVT. Xray left tib/fib and ankle negative for acute fracture or abnormality. BNP unremarkable, no major electrolyte abnormalities. I did order CK to evaluate for myositis which is negative. Patient's symptoms likely muscle strain. Plan to discharge home with instructions on RICE protocol and symptomatic management for pain.  Blood pressure elevated in the ER today, counseled her to have this rechecked by her PCP and to take her blood pressure medication when she gets home as she did not take this today.  She agrees and voiced understanding to the plan and appears reliable.  Final Clinical Impressions(s) / ED Diagnoses   Final diagnoses:  Pain of left calf  Chronic pain of left ankle    ED Discharge Orders    None       Bernarda Caffey 02/04/18 1727    Noemi Chapel, MD 02/08/18 2020

## 2018-02-04 NOTE — ED Notes (Signed)
ED Provider at bedside. 

## 2018-02-04 NOTE — ED Notes (Signed)
PA Shrosbre made aware of patient, verbal order given to this RN for doppler study to rule out DVT

## 2018-02-04 NOTE — ED Triage Notes (Signed)
Pt reports left leg pain x1 week, states pain starts in her ankle and travels up behind her knee. Denies any falls or known injuries, no warmth or edema present.

## 2018-02-04 NOTE — Discharge Instructions (Addendum)
Ultrasound of your left leg without blood clot.  X-rays reassuring, no broken bones or abnormality.  Blood work was reassuring.  You likely have strain in your calf due to recent walking.  Please take Aleve 500 mg twice a day for pain.  I have also written you prescription for Norco which is a narcotic pain medicine, this medicine can make you drowsy so please do not drive or work while taking it.  Follow-up with your regular doctor for recheck in a week if your symptoms are not improving.  Please also have your blood pressure rechecked as it was elevated in the ER today.  Return if you have any new or concerning symptoms like fever, worsening swelling in the leg, redness over the joint.

## 2018-02-04 NOTE — ED Triage Notes (Signed)
TTS 

## 2018-02-06 ENCOUNTER — Encounter (HOSPITAL_COMMUNITY): Payer: Self-pay | Admitting: Emergency Medicine

## 2018-02-06 ENCOUNTER — Emergency Department (HOSPITAL_COMMUNITY)
Admission: EM | Admit: 2018-02-06 | Discharge: 2018-02-06 | Discharge status: Against Medical Advice | Payer: BLUE CROSS/BLUE SHIELD | Attending: Emergency Medicine | Admitting: Emergency Medicine

## 2018-02-06 ENCOUNTER — Ambulatory Visit: Payer: BLUE CROSS/BLUE SHIELD | Admitting: Physical Therapy

## 2018-02-06 DIAGNOSIS — M791 Myalgia, unspecified site: Secondary | ICD-10-CM | POA: Diagnosis present

## 2018-02-06 DIAGNOSIS — Z5321 Procedure and treatment not carried out due to patient leaving prior to being seen by health care provider: Secondary | ICD-10-CM | POA: Diagnosis not present

## 2018-02-06 DIAGNOSIS — R52 Pain, unspecified: Secondary | ICD-10-CM

## 2018-02-06 LAB — CBC WITH DIFFERENTIAL/PLATELET
Abs Immature Granulocytes: 0 10*3/uL (ref 0.0–0.1)
Basophils Absolute: 0.1 10*3/uL (ref 0.0–0.1)
Basophils Relative: 1 %
Eosinophils Absolute: 0.3 10*3/uL (ref 0.0–0.7)
Eosinophils Relative: 4 %
HCT: 43.8 % (ref 36.0–46.0)
Hemoglobin: 13.4 g/dL (ref 12.0–15.0)
Immature Granulocytes: 0 %
Lymphocytes Relative: 38 %
Lymphs Abs: 3.1 10*3/uL (ref 0.7–4.0)
MCH: 25.7 pg — ABNORMAL LOW (ref 26.0–34.0)
MCHC: 30.6 g/dL (ref 30.0–36.0)
MCV: 83.9 fL (ref 78.0–100.0)
Monocytes Absolute: 0.4 10*3/uL (ref 0.1–1.0)
Monocytes Relative: 5 %
Neutro Abs: 4.1 10*3/uL (ref 1.7–7.7)
Neutrophils Relative %: 52 %
Platelets: 241 10*3/uL (ref 150–400)
RBC: 5.22 MIL/uL — ABNORMAL HIGH (ref 3.87–5.11)
RDW: 14.2 % (ref 11.5–15.5)
WBC: 8 10*3/uL (ref 4.0–10.5)

## 2018-02-06 LAB — COMPREHENSIVE METABOLIC PANEL
ALT: 30 U/L (ref 0–44)
AST: 25 U/L (ref 15–41)
Albumin: 3.8 g/dL (ref 3.5–5.0)
Alkaline Phosphatase: 80 U/L (ref 38–126)
Anion gap: 11 (ref 5–15)
BUN: 13 mg/dL (ref 6–20)
CO2: 22 mmol/L (ref 22–32)
Calcium: 9.7 mg/dL (ref 8.9–10.3)
Chloride: 107 mmol/L (ref 98–111)
Creatinine, Ser: 1.14 mg/dL — ABNORMAL HIGH (ref 0.44–1.00)
GFR calc Af Amer: >60 mL/min (ref >60)
GFR calc non Af Amer: 53 mL/min — ABNORMAL LOW (ref >60)
Glucose, Bld: 133 mg/dL — ABNORMAL HIGH (ref 70–99)
Potassium: 3.9 mmol/L (ref 3.5–5.1)
Sodium: 140 mmol/L (ref 135–145)
Total Bilirubin: 0.4 mg/dL (ref 0.3–1.2)
Total Protein: 6.9 g/dL (ref 6.5–8.1)

## 2018-02-06 LAB — URINALYSIS, ROUTINE W REFLEX MICROSCOPIC
Bilirubin Urine: NEGATIVE
Glucose, UA: NEGATIVE mg/dL
Ketones, ur: NEGATIVE mg/dL
Leukocytes, UA: NEGATIVE
Nitrite: NEGATIVE
Protein, ur: NEGATIVE mg/dL
Specific Gravity, Urine: 1.017 (ref 1.005–1.030)
pH: 6 (ref 5.0–8.0)

## 2018-02-06 MED ORDER — ONDANSETRON 4 MG PO TBDP: 4.0000 mg | ORAL_TABLET | Freq: Once | ORAL | Status: DC

## 2018-02-06 MED ORDER — ONDANSETRON 4 MG PO TBDP
4.0000 mg | ORAL_TABLET | Freq: Once | ORAL | Status: AC
  Administered 2018-02-06: 4 mg via ORAL
  Filled 2018-02-06: qty 1

## 2018-02-06 NOTE — ED Notes (Signed)
Pt to nurse first stating that she is no longer willing to wait to be seen. Attempted to redirect patient to stay and wait, but unsuccessful. Eloped after being seen by pit provider.

## 2018-02-06 NOTE — ED Notes (Addendum)
Pt came up and asked about wait time and how many people were in front of her. I told her and she handed me her stickers and said she is leaving. Pt walked out. RN Emilie notified

## 2018-02-06 NOTE — ED Triage Notes (Signed)
Pt presents with multiple complaints. Pt states she is SOB, reports bilateral leg pain, arm pain, N/V.

## 2018-02-06 NOTE — ED Provider Notes (Cosign Needed)
Patient placed in Quick Look pathway, seen and evaluated   Chief Complaint: Body aches and nausea  HPI:   Pt complains of nausea.  Pt had leg swelling several days ago.  No dvt on   ROS: no fever, no chest pain   Physical Exam:   Gen: No distress  Neuro: Awake and Alert  Skin: Warm    Focused Exam: Lungs clear, Heart rrr    Initiation of care has begun. The patient has been counseled on the process, plan, and necessity for staying for the completion/evaluation, and the remainder of the medical screening examination   Fransico Meadow, Hershal Coria 02/06/18 2005

## 2018-02-07 ENCOUNTER — Ambulatory Visit: Payer: BLUE CROSS/BLUE SHIELD

## 2018-02-08 NOTE — Progress Notes (Signed)
I agree with the above plan 

## 2018-02-09 ENCOUNTER — Ambulatory Visit: Payer: BLUE CROSS/BLUE SHIELD | Admitting: Rehabilitative and Restorative Service Providers"

## 2018-02-13 ENCOUNTER — Ambulatory Visit: Payer: BLUE CROSS/BLUE SHIELD

## 2018-02-14 ENCOUNTER — Ambulatory Visit: Payer: BLUE CROSS/BLUE SHIELD | Attending: Neurology | Admitting: Physical Therapy

## 2018-02-16 ENCOUNTER — Ambulatory Visit: Payer: BLUE CROSS/BLUE SHIELD | Admitting: Physical Therapy

## 2018-02-20 ENCOUNTER — Ambulatory Visit (INDEPENDENT_AMBULATORY_CARE_PROVIDER_SITE_OTHER): Payer: BLUE CROSS/BLUE SHIELD | Admitting: Pharmacist Clinician (PhC)/ Clinical Pharmacy Specialist

## 2018-02-20 ENCOUNTER — Encounter: Payer: Self-pay | Admitting: Adult Health

## 2018-02-20 ENCOUNTER — Encounter: Payer: Self-pay | Admitting: Pharmacist Clinician (PhC)/ Clinical Pharmacy Specialist

## 2018-02-20 ENCOUNTER — Encounter: Payer: Self-pay | Admitting: Family Medicine

## 2018-02-20 DIAGNOSIS — E7800 Pure hypercholesterolemia, unspecified: Secondary | ICD-10-CM

## 2018-02-20 LAB — LIPID PANEL
Chol/HDL Ratio: 3.2 ratio (ref 0.0–4.4)
Cholesterol, Total: 149 mg/dL (ref 100–199)
HDL: 46 mg/dL (ref >39)
LDL Calculated: 93 mg/dL (ref 0–99)
Triglycerides: 50 mg/dL (ref 0–149)
VLDL Cholesterol Cal: 10 mg/dL (ref 5–40)

## 2018-02-20 NOTE — Patient Instructions (Signed)
Get your cholesterol labs drawn today.  Stop ezetimibe.   We will start paperwork to get you on Repatha injections.  Evolocumab injection What is this medicine? EVOLOCUMAB (e voe LOK ue mab) is known as a PCSK9 inhibitor. It is used to lower the level of cholesterol in the blood. It may be used alone or in combination with other cholesterol-lowering drugs. This drug may also be used to reduce the risk of heart attack, stroke, and certain types of heart surgery in patients with heart disease. This medicine may be used for other purposes; ask your health care provider or pharmacist if you have questions. COMMON BRAND NAME(S): REPATHA What should I tell my health care provider before I take this medicine? They need to know if you have any of these conditions: -an unusual or allergic reaction to evolocumab, other medicines, foods, dyes, or preservatives -pregnant or trying to get pregnant -breast-feeding How should I use this medicine? This medicine is for injection under the skin. You will be taught how to prepare and give this medicine. Use exactly as directed. Take your medicine at regular intervals. Do not take your medicine more often than directed. It is important that you put your used needles and syringes in a special sharps container. Do not put them in a trash can. If you do not have a sharps container, call your pharmacist or health care provider to get one. Talk to your pediatrician regarding the use of this medicine in children. While this drug may be prescribed for children as young as 13 years for selected conditions, precautions do apply. Overdosage: If you think you have taken too much of this medicine contact a poison control center or emergency room at once. NOTE: This medicine is only for you. Do not share this medicine with others. What if I miss a dose? If you miss a dose, take it as soon as you can if there are more than 7 days until the next scheduled dose, or skip the  missed dose and take the next dose according to your original schedule. Do not take double or extra doses. What may interact with this medicine? Interactions are not expected. This list may not describe all possible interactions. Give your health care provider a list of all the medicines, herbs, non-prescription drugs, or dietary supplements you use. Also tell them if you smoke, drink alcohol, or use illegal drugs. Some items may interact with your medicine. What should I watch for while using this medicine? You may need blood work while you are taking this medicine. What side effects may I notice from receiving this medicine? Side effects that you should report to your doctor or health care professional as soon as possible: -allergic reactions like skin rash, itching or hives, swelling of the face, lips, or tongue -signs and symptoms of infection like fever or chills; cough; sore throat; pain or trouble passing urine Side effects that usually do not require medical attention (report to your doctor or health care professional if they continue or are bothersome): -diarrhea -nausea -muscle pain -pain, redness, or irritation at site where injected This list may not describe all possible side effects. Call your doctor for medical advice about side effects. You may report side effects to FDA at 1-800-FDA-1088. Where should I keep my medicine? Keep out of the reach of children. You will be instructed on how to store this medicine. Throw away any unused medicine after the expiration date on the label. NOTE: This sheet is a summary.  It may not cover all possible information. If you have questions about this medicine, talk to your doctor, pharmacist, or health care provider.  2018 Elsevier/Gold Standard (2016-05-30 13:21:53)

## 2018-02-20 NOTE — Assessment & Plan Note (Signed)
Patient with hyperlipidemia and history of CVA, with LDL not at goal at 93.  Will start paperwork to get PCSK-9 inhibitor covered by insurance.  She should discontinue ezetimibe, as the benefit is not worth the side effects for her.  Patient was also encouraged to follow up with PCP and neurologist for ongoing pain issues, as it is important that we get her BP controlled as soon as possible.

## 2018-02-20 NOTE — Progress Notes (Signed)
02/20/2018 Teresa [name] 02-Sep-1960 [id number]   HPI:  Teresa [name] is a 57 y.o. female patient of Dr Oval Linsey, who presents today for a lipid clinic evaluation.  In addition to hyperlipidemia, her medical history is significant for hypertension, migraines, prior CVA (cryptogenic, 08-2017), grade 1 diastolic dysfunction,  hypothyroidism and obesity.    Today she comes into the office quite discouraged.  She is continuing to have pain on the left side of her body, from legs up thru shoulder.  Has been to ED twice this month with no relief or indication of cause.  Because of the pain as well as compliance problems with her BP medications, her home readings continue to be elevated, with diastolic readings often > 100.   She tells me she does not take her BP meds as prescribed, because "nothing is working", and regardless of what she does, her BP remains the same.  She also notes that she is taking ezetimibe, but can only take about twice weekly, as it causes severe muscle aches in her shoulders, on top of the pain she is already noting.    She assures me that today is just a bad day, that she really wants to feel better, but is getting frustrated with the lack of results to date.  Since her stroke she has made significant lifestyle changes (see below), but has yet to have any significant weight loss or improvement of her BP.  Current Medications:  Ezetimibe 10 mg - takes about twice weekly due to shoulder pain  Cholesterol Goals:   LDL < 70  Intolerant/previously tried:  Atorvastatin 20 and 40 mg doses - causes severe myalgias  Pravastatin 40 mg - caused severe myalgias  Family history:   Mother had CABG X 3, at 32, still alive now in her 70's  MGF died from MI in his 28's  Father - pancreatic cancer, multiple strokes, died at 69  3 children - all with htn dx at early 20's, states 2 of them are not compliant with medications  Diet:   Since her stroke has made many dietary changes; eats  Cheerios or a boiled egg for breakfast;  Lunch and dinner consist of skinless chicken, fish, plenty of fruits and vegetables.  Cut out junk and processed foods  Exercise:    Now gets up at 6 am to walk 3-4 miles each day Labs:   08/2017:  TC 171, TG 63, HDL 40, LDL 118  01/2018:  TC 149, TG 50, HDL 46, LDL 93 (on ezetimibe 1-2 times per week)  Current Outpatient Medications  Medication Sig Dispense Refill  . aspirin 81 MG chewable tablet Chew 1 tablet (81 mg total) by mouth daily. 30 tablet 0  . butalbital-acetaminophen-caffeine (FIORICET, ESGIC) 50-325-40 MG tablet Take 1 tablet by mouth every 6 (six) hours as needed for headache or migraine. 20 tablet 0  . carvedilol (COREG) 12.5 MG tablet Take 1 tablet (12.5 mg total) by mouth 2 (two) times daily with a meal. 60 tablet 1  . chlorthalidone (HYGROTON) 25 MG tablet Take 1 tablet (25 mg total) by mouth daily. 90 tablet 1  . cycloSPORINE (RESTASIS) 0.05 % ophthalmic emulsion Place 1 drop into both eyes 2 (two) times daily.     Marland Kitchen ezetimibe (ZETIA) 10 MG tablet Take 10 mg by mouth daily.    . hydrALAZINE (APRESOLINE) 50 MG tablet Take 50 mg by mouth daily.     Marland Kitchen HYDROcodone-acetaminophen (NORCO/VICODIN) 5-325 MG tablet Take 1 tablet by mouth every 6 (  six) hours as needed. 12 tablet 0  . levothyroxine (SYNTHROID, LEVOTHROID) 112 MCG tablet TAKE ONE TABLET BY MOUTH ONCE DAILY BEFORE BREAKFAST 90 tablet 1  . spironolactone (ALDACTONE) 50 MG tablet Take 1 tablet (50 mg total) by mouth daily. 90 tablet 1   Current Facility-Administered Medications  Medication Dose Route Frequency Provider Last Rate Last Dose  . olopatadine (PATANOL) 0.1 % ophthalmic solution 1 drop  1 drop Both Eyes BID McDiarmid, Blane Ohara, MD        Allergies  Allergen Reactions  . Bactrim [Sulfamethoxazole-Trimethoprim] Anaphylaxis  . Clonidine Derivatives Swelling and Other (See Comments)    Facial swelling  . Amlodipine Swelling    Leg swelling  . Lisinopril Swelling    Lip  swelling  . Statins Other (See Comments)    Body and muscle aches on Statin (Lipitor, Pravachol)  . Aspirin Nausea Only  . Latex Rash    Past Medical History:  Diagnosis Date  . Allergy   . Anemia   . Cervical disc disorder with radiculopathy of cervical region 02/10/2016  . Chronic lower back pain   . Diverticulosis   . EKG, abnormal 01/06/2017  . Gastritis   . GERD (gastroesophageal reflux disease)   . H/O hiatal hernia   . Hiatal hernia   . History of amputation of lesser toe of left foot ([place]) 03/11/2015  . History of colon cancer   . Hypertension   . Hyperthyroidism    hx  . Hypothyroidism   . Migraine headache    "a few times/yr" (03/05/2015)  . Neck strain 02/06/2012  . Osteomyelitis ([place])    [name] 03/05/2015  . Osteomyelitis of toe of left foot ([place]) 03/05/2015  . Palpitations 01/05/2016  . Positive TB test   . Stroke ([place])   . Tubular adenoma of colon   . Tubular adenoma of colon     There were no vitals taken for this visit.   Hyperlipidemia Patient with hyperlipidemia and history of CVA, with LDL not at goal at 39.  Will start paperwork to get PCSK-9 inhibitor covered by insurance.  She should discontinue ezetimibe, as the benefit is not worth the side effects for her.  Patient was also encouraged to follow up with PCP and neurologist for ongoing pain issues, as it is important that we get her BP controlled as soon as possible.    Tommy Medal PharmD CPP [facility]

## 2018-02-28 ENCOUNTER — Ambulatory Visit (INDEPENDENT_AMBULATORY_CARE_PROVIDER_SITE_OTHER): Payer: BLUE CROSS/BLUE SHIELD | Admitting: *Deleted

## 2018-02-28 DIAGNOSIS — I639 Cerebral infarction, unspecified: Secondary | ICD-10-CM

## 2018-02-28 NOTE — Progress Notes (Signed)
[facility] Summary Report / Loop Recorder 

## 2018-03-07 LAB — CUP PACEART REMOTE DEVICE CHECK
Date Time Interrogation Session: 20190801223914
Implantable Pulse Generator Implant Date: 20190325

## 2018-03-11 ENCOUNTER — Emergency Department (HOSPITAL_COMMUNITY): Payer: BLUE CROSS/BLUE SHIELD

## 2018-03-11 ENCOUNTER — Emergency Department (HOSPITAL_COMMUNITY)
Admission: EM | Admit: 2018-03-11 | Discharge: 2018-03-11 | Disposition: A | Discharge status: Home / Self Care | Payer: BLUE CROSS/BLUE SHIELD | Attending: Emergency Medicine | Admitting: Emergency Medicine

## 2018-03-11 ENCOUNTER — Other Ambulatory Visit: Payer: Self-pay

## 2018-03-11 ENCOUNTER — Encounter (HOSPITAL_COMMUNITY): Payer: Self-pay | Admitting: Emergency Medicine

## 2018-03-11 DIAGNOSIS — E039 Hypothyroidism, unspecified: Secondary | ICD-10-CM | POA: Diagnosis not present

## 2018-03-11 DIAGNOSIS — Z87891 Personal history of nicotine dependence: Secondary | ICD-10-CM | POA: Diagnosis not present

## 2018-03-11 DIAGNOSIS — R200 Anesthesia of skin: Secondary | ICD-10-CM | POA: Diagnosis not present

## 2018-03-11 DIAGNOSIS — R51 Headache: Secondary | ICD-10-CM | POA: Insufficient documentation

## 2018-03-11 DIAGNOSIS — Z91040 Latex allergy status: Secondary | ICD-10-CM | POA: Insufficient documentation

## 2018-03-11 DIAGNOSIS — R519 Headache, unspecified: Secondary | ICD-10-CM

## 2018-03-11 DIAGNOSIS — Z7982 Long term (current) use of aspirin: Secondary | ICD-10-CM | POA: Diagnosis not present

## 2018-03-11 DIAGNOSIS — Z79899 Other long term (current) drug therapy: Secondary | ICD-10-CM | POA: Insufficient documentation

## 2018-03-11 DIAGNOSIS — I10 Essential (primary) hypertension: Secondary | ICD-10-CM | POA: Diagnosis not present

## 2018-03-11 LAB — CBC WITH DIFFERENTIAL/PLATELET
Abs Immature Granulocytes: 0 10*3/uL (ref 0.0–0.1)
Basophils Absolute: 0.1 10*3/uL (ref 0.0–0.1)
Basophils Relative: 1 %
Eosinophils Absolute: 0.3 10*3/uL (ref 0.0–0.7)
Eosinophils Relative: 3 %
HCT: 43.6 % (ref 36.0–46.0)
Hemoglobin: 13.8 g/dL (ref 12.0–15.0)
Immature Granulocytes: 0 %
Lymphocytes Relative: 38 %
Lymphs Abs: 2.9 10*3/uL (ref 0.7–4.0)
MCH: 26.4 pg (ref 26.0–34.0)
MCHC: 31.7 g/dL (ref 30.0–36.0)
MCV: 83.4 fL (ref 78.0–100.0)
Monocytes Absolute: 0.5 10*3/uL (ref 0.1–1.0)
Monocytes Relative: 7 %
Neutro Abs: 3.9 10*3/uL (ref 1.7–7.7)
Neutrophils Relative %: 51 %
Platelets: 294 10*3/uL (ref 150–400)
RBC: 5.23 MIL/uL — ABNORMAL HIGH (ref 3.87–5.11)
RDW: 14.2 % (ref 11.5–15.5)
WBC: 7.7 10*3/uL (ref 4.0–10.5)

## 2018-03-11 LAB — COMPREHENSIVE METABOLIC PANEL
ALT: 32 U/L (ref 0–44)
AST: 24 U/L (ref 15–41)
Albumin: 4 g/dL (ref 3.5–5.0)
Alkaline Phosphatase: 76 U/L (ref 38–126)
Anion gap: 9 (ref 5–15)
BUN: 14 mg/dL (ref 6–20)
CO2: 23 mmol/L (ref 22–32)
Calcium: 9.6 mg/dL (ref 8.9–10.3)
Chloride: 108 mmol/L (ref 98–111)
Creatinine, Ser: 1.05 mg/dL — ABNORMAL HIGH (ref 0.44–1.00)
GFR calc Af Amer: >60 mL/min (ref >60)
GFR calc non Af Amer: 59 mL/min — ABNORMAL LOW (ref >60)
Glucose, Bld: 70 mg/dL (ref 70–99)
Potassium: 4.2 mmol/L (ref 3.5–5.1)
Sodium: 140 mmol/L (ref 135–145)
Total Bilirubin: 0.6 mg/dL (ref 0.3–1.2)
Total Protein: 7.1 g/dL (ref 6.5–8.1)

## 2018-03-11 LAB — URINALYSIS, ROUTINE W REFLEX MICROSCOPIC
Bilirubin Urine: NEGATIVE
Glucose, UA: NEGATIVE mg/dL
Hgb urine dipstick: NEGATIVE
Ketones, ur: NEGATIVE mg/dL
Leukocytes, UA: NEGATIVE
Nitrite: NEGATIVE
Protein, ur: NEGATIVE mg/dL
Specific Gravity, Urine: 1.021 (ref 1.005–1.030)
pH: 6 (ref 5.0–8.0)

## 2018-03-11 MED ORDER — SODIUM CHLORIDE 0.9 % IV BOLUS
1000.0000 mL | Freq: Once | INTRAVENOUS | Status: AC
  Administered 2018-03-11: 1000 mL via INTRAVENOUS

## 2018-03-11 MED ORDER — DIPHENHYDRAMINE HCL 50 MG/ML IJ SOLN
25.0000 mg | Freq: Once | INTRAMUSCULAR | Status: AC
  Administered 2018-03-11: 25 mg via INTRAVENOUS
  Filled 2018-03-11: qty 1

## 2018-03-11 MED ORDER — PROCHLORPERAZINE MALEATE 10 MG PO TABS: 10.0000 mg | ORAL_TABLET | Freq: Two times a day (BID) | ORAL | 0 refills | Status: DC | PRN

## 2018-03-11 MED ORDER — PROCHLORPERAZINE EDISYLATE 10 MG/2ML IJ SOLN
10.0000 mg | Freq: Once | INTRAMUSCULAR | Status: AC
  Administered 2018-03-11: 10 mg via INTRAVENOUS
  Filled 2018-03-11: qty 2

## 2018-03-11 NOTE — ED Notes (Signed)
Patient transported to X-ray 

## 2018-03-11 NOTE — ED Triage Notes (Signed)
Pt. Stated, I was in church and I became SOB and my whole left side is hurting. My leg, arm, shoulder , head and neck is hurting.

## 2018-03-11 NOTE — ED Provider Notes (Signed)
[facility] EMERGENCY DEPARTMENT Provider Note   CSN: [id number] Arrival date & time: 03/11/18  6389     History   Chief Complaint Chief Complaint  Patient presents with  . Shortness of Breath  . Leg Pain  . Arm Pain  . Neck Pain    HPI Teresa [name] is a 57 y.o. female.  The history is provided by the patient and medical records. No language interpreter was used.  Headache   This is a new problem. The current episode started more than 2 days ago. The problem occurs constantly. The problem has not changed since onset.The headache is associated with bright light and loud noise. The quality of the pain is described as dull. The pain is severe. The pain does not radiate. Pertinent negatives include no fever, no near-syncope, no shortness of breath, no nausea and no vomiting. She has tried nothing for the symptoms. The treatment provided no relief.    Past Medical History:  Diagnosis Date  . Allergy   . Anemia   . Cervical disc disorder with radiculopathy of cervical region 02/10/2016  . Chronic lower back pain   . Diverticulosis   . EKG, abnormal 01/06/2017  . Gastritis   . GERD (gastroesophageal reflux disease)   . H/O hiatal hernia   . Hiatal hernia   . History of amputation of lesser toe of left foot ([place]) 03/11/2015  . History of colon cancer   . Hypertension   . Hyperthyroidism    hx  . Hypothyroidism   . Migraine headache    "a few times/yr" (03/05/2015)  . Neck strain 02/06/2012  . Osteomyelitis ([place])    [name] 03/05/2015  . Osteomyelitis of toe of left foot ([place]) 03/05/2015  . Palpitations 01/05/2016  . Positive TB test   . Stroke ([place])   . Tubular adenoma of colon   . Tubular adenoma of colon     Patient Active Problem List   Diagnosis Date Noted  . Migraine 01/12/2018  . Ear pain, left 12/13/2017  . Edema 11/07/2017  . Hyperlipidemia   . Cerebrovascular accident (CVA) due to embolism of cerebral artery ([place]) 09/15/2017  . History of  cholecystectomy 06/27/2017  . Small intestinal bacterial overgrowth 03/23/2017  . Paresthesia 01/06/2017  . Prediabetes 10/29/2015  . Tendinopathy of left rotator cuff 10/27/2015  . Essential hypertension   . Right shoulder pain 04/15/2014  . Obesity 04/15/2014  . Cervicalgia 02/11/2014  . Lateral knee pain, left 08/06/2013  . Allergic rhinoconjunctivitis 07/19/2013  . Thyroid nodule 02/12/2013  . GERD (gastroesophageal reflux disease) 02/06/2012  . Tobacco abuse 09/21/2011  . Hypothyroidism 09/21/2011  . Throat pain 09/21/2011    Past Surgical History:  Procedure Laterality Date  . ABDOMINAL HYSTERECTOMY  2006  . AMPUTATION TOE Left 03/05/2015   Procedure: AMPUTATION LEFT 5TH  TOE;  Surgeon: Wylene Simmer, MD;  Location: [facility];  Service: Orthopedics;  Laterality: Left;  . APPENDECTOMY  2006  . BREAST BIOPSY Left 11/26/2014  . [place]  . COLONOSCOPY    . GALLBLADDER SURGERY    . LOOP RECORDER INSERTION N/A 09/18/2017   Procedure: LOOP RECORDER INSERTION;  Surgeon: Evans Lance, MD;  Location: [facility];  Service: Cardiovascular;  Laterality: N/A;  . TEE WITHOUT CARDIOVERSION N/A 09/18/2017   Procedure: TRANSESOPHAGEAL ECHOCARDIOGRAM (TEE);  Surgeon: Pixie Casino, MD;  Location: [facility] ENDOSCOPY;  Service: Cardiovascular;  Laterality: N/A;  . TOE AMPUTATION Left 03/05/2015  5th toe  . TUBAL LIGATION Bilateral 1990  . UPPER GASTROINTESTINAL ENDOSCOPY       OB History   None      Home Medications    Prior to Admission medications   Medication Sig Start Date End Date Taking? Authorizing Provider  aspirin 81 MG chewable tablet Chew 1 tablet (81 mg total) by mouth daily. 09/19/17   Sherene Sires, DO  butalbital-acetaminophen-caffeine (FIORICET, ESGIC) 50-325-40 MG tablet Take 1 tablet by mouth every 6 (six) hours as needed for headache or migraine. 01/12/18 01/12/19  Kinnie Feil, MD  carvedilol (COREG) 12.5 MG tablet Take 1 tablet (12.5 mg  total) by mouth 2 (two) times daily with a meal. 12/06/17   Kinnie Feil, MD  chlorthalidone (HYGROTON) 25 MG tablet Take 1 tablet (25 mg total) by mouth daily. 01/11/18   Skeet Latch, MD  cycloSPORINE (RESTASIS) 0.05 % ophthalmic emulsion Place 1 drop into both eyes 2 (two) times daily.     [provider]  ezetimibe (ZETIA) 10 MG tablet Take 10 mg by mouth daily.    [provider]  hydrALAZINE (APRESOLINE) 50 MG tablet Take 50 mg by mouth daily.     [provider]  HYDROcodone-acetaminophen (NORCO/VICODIN) 5-325 MG tablet Take 1 tablet by mouth every 6 (six) hours as needed. 02/04/18   Glyn Ade, PA-C  levothyroxine (SYNTHROID, LEVOTHROID) 112 MCG tablet TAKE ONE TABLET BY MOUTH ONCE DAILY BEFORE BREAKFAST 01/04/18   Kinnie Feil, MD  spironolactone (ALDACTONE) 50 MG tablet Take 1 tablet (50 mg total) by mouth daily. 01/04/18   Kinnie Feil, MD    Family History Family History  Problem Relation Age of Onset  . Heart disease Mother        CAB age 88  . Diabetes Mother   . Cancer Father 19       pancreatic cancer  . Stroke Father   . Diabetes Sister   . Kidney disease Sister        renal failure  . Diabetes Brother   . Kidney disease Brother        renal failure  . Heart disease Other   . Diabetes Other   . Breast cancer Maternal Aunt        per pt aunts and uncles died of cancer not sure the type  . Breast cancer Paternal Aunt   . Esophageal cancer Neg Hx   . Rectal cancer Neg Hx   . Stomach cancer Neg Hx     Social History Social History   Tobacco Use  . Smoking status: Former Smoker    Packs/day: 0.50    Years: 36.00    Pack years: 18.00    Types: Cigarettes    Last attempt to quit: 09/23/2017    Years since quitting: 0.4  . Smokeless tobacco: Never Used  . Tobacco comment: Tobacco info given 03/01/17  Substance Use Topics  . Alcohol use: No  . Drug use: No     Allergies   Bactrim  [sulfamethoxazole-trimethoprim]; Clonidine derivatives; Amlodipine; Lisinopril; Statins; Aspirin; and Latex   Review of Systems Review of Systems  Constitutional: Negative for chills, diaphoresis, fatigue and fever.  HENT: Negative for congestion.   Respiratory: Negative for cough, chest tightness, shortness of breath and wheezing.   Cardiovascular: Negative for chest pain and near-syncope.  Gastrointestinal: Negative for abdominal pain, anal bleeding, nausea and vomiting.  Genitourinary: Negative for dysuria, flank pain, frequency and genital sores.  Musculoskeletal: Positive  for neck pain. Negative for back pain and neck stiffness.  Neurological: Positive for weakness (subjective), numbness and headaches. Negative for dizziness, seizures and light-headedness.  Psychiatric/Behavioral: Negative for agitation and confusion.  All other systems reviewed and are negative.    Physical Exam Updated Vital Signs BP 124/73   Pulse 75   Temp 98.5 F (36.9 C) (Oral)   Resp 14   Ht 5\' 5"  (1.651 m)   Wt 90.7 kg   SpO2 96%   BMI 33.28 kg/m   Physical Exam  Constitutional: She is oriented to person, place, and time. She appears well-developed and well-nourished. No distress.  HENT:  Head: Normocephalic and atraumatic.  Mouth/Throat: Oropharynx is clear and moist. No oropharyngeal exudate.  Eyes: Pupils are equal, round, and reactive to light. Conjunctivae and EOM are normal.  Neck: Normal range of motion. Neck supple.  Cardiovascular: Normal rate and regular rhythm.  No murmur heard. Pulmonary/Chest: Effort normal and breath sounds normal. No respiratory distress. She has no wheezes. She has no rales. She exhibits no tenderness.  Abdominal: Soft. There is no tenderness.  Musculoskeletal: She exhibits tenderness. She exhibits no edema.  Neurological: She is alert and oriented to person, place, and time. She displays normal reflexes. A sensory deficit is present. No cranial nerve deficit.  She exhibits normal muscle tone. Coordination normal.  Left facial numbness by report.  Normal sensation and strength in extremes.  No facial droop.  Normal extraocular movements.  No focal neurologic deficits aside from the subjective numbness of the left face and reported subjective weakness in the left hand.  Skin: Skin is warm and dry. Capillary refill takes less than 2 seconds. No rash noted. She is not diaphoretic. No erythema.  Psychiatric: She has a normal mood and affect.  Nursing note and vitals reviewed.    ED Treatments / Results  Labs (all labs ordered are listed, but only abnormal results are displayed) Labs Reviewed  CBC WITH DIFFERENTIAL/PLATELET - Abnormal; Notable for the following components:      Result Value   RBC 5.23 (*)    All other components within normal limits  COMPREHENSIVE METABOLIC PANEL - Abnormal; Notable for the following components:   Creatinine, Ser 1.05 (*)    GFR calc non Af Amer 59 (*)    All other components within normal limits  URINALYSIS, ROUTINE W REFLEX MICROSCOPIC    EKG EKG Interpretation  Date/Time:  Sunday March 11 2018 09:56:52 EDT Ventricular Rate:  69 PR Interval:  156 QRS Duration: 84 QT Interval:  426 QTC Calculation: 456 R Axis:   19 Text Interpretation:  Normal sinus rhythm Biatrial enlargement Left ventricular hypertrophy with repolarization abnormality Abnormal ECG When compared to prior, no significant changes seen.  No STEMI Confirmed by Antony Blackbird [phone]) on 03/11/2018 10:42:20 AM   Radiology Dg Chest 2 View  Result Date: 03/11/2018 CLINICAL DATA:  Left-sided chest pain and shortness of breath EXAM: CHEST - 2 VIEW COMPARISON:  09/15/2017 FINDINGS: Cardiac shadow is at the upper limits of normal in size but accentuated by the frontal technique. The lungs are clear. No pneumothorax, effusion or infiltrate is noted. No bony abnormality is seen. IMPRESSION: No active cardiopulmonary disease. Electronically Signed    By: Inez Catalina M.D.   On: 03/11/2018 10:58   Mr Brain Wo Contrast  Result Date: 03/11/2018 CLINICAL DATA:  Left-sided sensory abnormalities beginning a acutely this morning. EXAM: MRI HEAD WITHOUT CONTRAST TECHNIQUE: Multiplanar, multiecho pulse sequences of the brain and surrounding  structures were obtained without intravenous contrast. COMPARISON:  CT and MRI studies 09/15/2017 FINDINGS: Brain: Diffusion imaging does not show any acute or subacute infarction. There are mild chronic small-vessel changes of the pons. No cerebellar infarction is seen. Cerebral hemispheres show chronic small-vessel infarctions of the basal ganglia, thalami I and hemispheric white matter, right more than left. No large vessel confluent infarction. No mass lesion, hemorrhage, hydrocephalus or extra-axial collection. Scattered punctate foci of hemosiderin deposition are seen related to some of the old small vessel infarctions. Vascular: Major vessels at the base of the brain show flow. Skull and upper cervical spine: Negative Sinuses/Orbits: Clear/normal Other: None IMPRESSION: No acute finding by MRI today. Chronic small vessel infarctions scattered throughout the brain as outlined above, many of which were acute on the study of 09/15/2017 Electronically Signed   By: Nelson Chimes M.D.   On: 03/11/2018 14:30    Procedures Procedures (including critical care time)  Medications Ordered in ED Medications  prochlorperazine (COMPAZINE) injection 10 mg (10 mg Intravenous Given 03/11/18 1243)  diphenhydrAMINE (BENADRYL) injection 25 mg (25 mg Intravenous Given 03/11/18 1243)  sodium chloride 0.9 % bolus 1,000 mL (0 mLs Intravenous Stopped 03/11/18 1530)     Initial Impression / Assessment and Plan / ED Course  I have reviewed the triage vital signs and the nursing notes.  Pertinent labs & imaging results that were available during my care of the patient were reviewed by me and considered in my medical decision making (see  chart for details).     Teresa [name] is a 57 y.o. female with a past medical history significant for prior stroke, hypertension, hyperlipidemia, prior osteomyelitis, migraines, and chronic left-sided body pains who presents with headache, left facial numbness, and left-sided pains.  Patient reports that she has a headache for the last 3 days that has been severe.  She reports it feels similar to when she had her stroke.  She denies nausea, vomiting, or vision changes.  She reports her left face is numb in her left arm feels weak.  This is also similar to her prior stroke.  She denies any numbness or weakness in the left leg.  She reports pain in her left head and her left hemibody which she reports she has been extensively worked up for and they feel it is likely muscular skeletal.  Patient does report associated photophobia and phonophobia similar to prior migraines.   On exam, patient has no facial droop.  Normal extraocular movements.  Patient has decreased sensation in her left face and normal sensation otherwise.  No strength deficit found in her arms or legs.  Normal coordination with finger-nose-finger testing.  Lungs clear and chest nontender.  Patient had left lateral neck tenderness which she reports is her muscles.  Clinically I suspect patient has a complicated migraine however given her report of left facial numbness that is new over the last several days and the headache similar to prior, patient will have MRI.    MRI showed old strokes but no evidence of new stroke.  Patient given a headache cocktail for likely, gated migraine.    On my reassessment, patient reports her numbness has resolved and her weakness feels better.  Her headache is gone.  I feel this likely confirms comp gated migraine as etiology.  Patient will be given prescription for Compazine and instructed to use Benadryl at home with it if needed for her headache.  Patient will follow-up with her PCP and neurologist for  further  management.  Do not feel patient needs further work-up at this time as symptoms have improved.    Patient discharged home with improved symptoms.  Final Clinical Impressions(s) / ED Diagnoses   Final diagnoses:  Acute nonintractable headache, unspecified headache type  Numbness    ED Discharge Orders         Ordered    prochlorperazine (COMPAZINE) 10 MG tablet  2 times daily PRN     03/11/18 1505         Clinical Impression: 1. Acute nonintractable headache, unspecified headache type   2. Numbness     Disposition: Discharge  Condition: Good  I have discussed the results, Dx and Tx plan with the pt(& family if present). He/she/they expressed understanding and agree(s) with the plan. Discharge instructions discussed at great length. Strict return precautions discussed and pt &/or family have verbalized understanding of the instructions. No further questions at time of discharge.    Discharge Medication List as of 03/11/2018  3:07 PM    START taking these medications   Details  prochlorperazine (COMPAZINE) 10 MG tablet Take 1 tablet (10 mg total) by mouth 2 (two) times daily as needed for nausea or vomiting., Starting Sun 03/11/2018, Print        Follow Up: Kinnie Feil, MD [place] [address] [place] [address] [phone] [place] [phone]    Your Neurologist        Tegeler, Gwenyth Allegra, MD 03/11/18 [phone]

## 2018-03-11 NOTE — ED Notes (Signed)
Patient transported to MRI 

## 2018-03-11 NOTE — Discharge Instructions (Signed)
Your MRI today did not show evidence of new bleeds or strokes.  As your numbness and weakness improved after the headache improved, I suspect it was more of a complicated migraine type headache.  Please use the medicine we prescribed to help with the headaches and nausea.  Please use Benadryl as well to help if you have agitation with it.  Please follow-up with your primary care physician and your neurologist for further management.  If any symptoms change or worsen, please return to the nearest emergency department.

## 2018-03-18 ENCOUNTER — Other Ambulatory Visit: Payer: Self-pay | Admitting: Family Medicine

## 2018-03-23 LAB — CUP PACEART REMOTE DEVICE CHECK
Date Time Interrogation Session: 20190903233540
Implantable Pulse Generator Implant Date: 20190325

## 2018-04-02 ENCOUNTER — Ambulatory Visit (INDEPENDENT_AMBULATORY_CARE_PROVIDER_SITE_OTHER): Payer: BLUE CROSS/BLUE SHIELD | Admitting: *Deleted

## 2018-04-02 DIAGNOSIS — I639 Cerebral infarction, unspecified: Secondary | ICD-10-CM

## 2018-04-02 DIAGNOSIS — I5032 Chronic diastolic (congestive) heart failure: Secondary | ICD-10-CM

## 2018-04-02 NOTE — Progress Notes (Signed)
[facility] Summary Report / Loop Recorder 

## 2018-04-03 LAB — CUP PACEART REMOTE DEVICE CHECK
Date Time Interrogation Session: 20191007000756
Implantable Pulse Generator Implant Date: 20190325

## 2018-04-08 NOTE — Progress Notes (Signed)
Patient ID: Teresa [name], female   DOB: 04-May-1961, 57 y.o.   MRN: [id number]           Referring Provider: Dr. Gwendlyn Deutscher  Reason for Appointment:  Hypothyroidism, new visit    History of Present Illness:   Hypothyroidism was first diagnosed in 2005  At the time of diagnosis patient had radioactive iodine treatment for hyperthyroidism.  Although she had prominence of her eyes her treatment diagnosis for the I-131 treatment was toxic multinodular goiter with uptake of about 35% She was treated with 24.9 mCi  Although the patient thinks that she has had progressive increase in her thyroid dosage it appears that she has been on the same dose of 112 mcg since 2017 when she was taking 100 mcg Her main complaint is weight gain She does not complain of any unusual fatigue or cold intolerance She is having some hot flashes and also complaining about dry skin and breaking nails  The patient takes the thyroid supplement about an hour before breakfast         Patient's weight history is as follows:   Wt Readings from Last 3 Encounters:  04/09/18 206 lb (93.4 kg)  03/11/18 200 lb (90.7 kg)  02/06/18 200 lb (90.7 kg)    Thyroid function results have been as follows:  Lab Results  Component Value Date   TSH 2.407 09/15/2017   TSH 1.710 11/04/2016   TSH 4.100 09/27/2016   TSH 1.83 06/14/2016   FREET4 1.18 (H) 09/15/2017   FREET4 1.65 11/04/2016   FREET4 0.91 02/12/2013   T3FREE 3.2 11/04/2016     Past Medical History:  Diagnosis Date  . Allergy   . Anemia   . Cervical disc disorder with radiculopathy of cervical region 02/10/2016  . Chronic lower back pain   . Diverticulosis   . EKG, abnormal 01/06/2017  . Gastritis   . GERD (gastroesophageal reflux disease)   . H/O hiatal hernia   . Hiatal hernia   . History of amputation of lesser toe of left foot ([place]) 03/11/2015  . History of colon cancer   . Hypertension   . Hyperthyroidism    hx  . Hypothyroidism   . Migraine  headache    "a few times/yr" (03/05/2015)  . Neck strain 02/06/2012  . Osteomyelitis ([place])    [name] 03/05/2015  . Osteomyelitis of toe of left foot ([place]) 03/05/2015  . Palpitations 01/05/2016  . Positive TB test   . Stroke ([place])   . Tubular adenoma of colon   . Tubular adenoma of colon     Past Surgical History:  Procedure Laterality Date  . ABDOMINAL HYSTERECTOMY  2006  . AMPUTATION TOE Left 03/05/2015   Procedure: AMPUTATION LEFT 5TH  TOE;  Surgeon: Wylene Simmer, MD;  Location: [facility];  Service: Orthopedics;  Laterality: Left;  . APPENDECTOMY  2006  . BREAST BIOPSY Left 11/26/2014  . [place]  . COLONOSCOPY    . GALLBLADDER SURGERY    . LOOP RECORDER INSERTION N/A 09/18/2017   Procedure: LOOP RECORDER INSERTION;  Surgeon: Evans Lance, MD;  Location: [place] [facility];  Service: Cardiovascular;  Laterality: N/A;  . TEE WITHOUT CARDIOVERSION N/A 09/18/2017   Procedure: TRANSESOPHAGEAL ECHOCARDIOGRAM (TEE);  Surgeon: Pixie Casino, MD;  Location: [facility] ENDOSCOPY;  Service: Cardiovascular;  Laterality: N/A;  . TOE AMPUTATION Left 03/05/2015   5th toe  . TUBAL LIGATION Bilateral 1990  . UPPER GASTROINTESTINAL ENDOSCOPY  Family History  Problem Relation Age of Onset  . Heart disease Mother        CAB age 94  . Diabetes Mother   . Cancer Father 22       pancreatic cancer  . Stroke Father   . Diabetes Sister   . Kidney disease Sister        renal failure  . Diabetes Brother   . Kidney disease Brother        renal failure  . Heart disease Other   . Diabetes Other   . Breast cancer Maternal Aunt        per pt aunts and uncles died of cancer not sure the type  . Breast cancer Paternal Aunt   . Esophageal cancer Neg Hx   . Rectal cancer Neg Hx   . Stomach cancer Neg Hx     Social History:  reports that she quit smoking about 6 months ago. Her smoking use included cigarettes. She has a 18.00 pack-year smoking history. She has never used smokeless  tobacco. She reports that she does not drink alcohol or use drugs.  Allergies:  Allergies  Allergen Reactions  . Bactrim [Sulfamethoxazole-Trimethoprim] Anaphylaxis  . Clonidine Derivatives Swelling and Other (See Comments)    Facial swelling  . Amlodipine Swelling    Leg swelling  . Lisinopril Swelling    Lip swelling  . Statins Other (See Comments)    Body and muscle aches on Statin (Lipitor, Pravachol)  . Aspirin Nausea Only  . Latex Rash    Allergies as of 04/09/2018      Reactions   Bactrim [sulfamethoxazole-trimethoprim] Anaphylaxis   Clonidine Derivatives Swelling, Other (See Comments)   Facial swelling   Amlodipine Swelling   Leg swelling   Lisinopril Swelling   Lip swelling   Statins Other (See Comments)   Body and muscle aches on Statin (Lipitor, Pravachol)   Aspirin Nausea Only   Latex Rash      Medication List        Accurate as of 04/09/18  2:41 PM. Always use your most recent med list.          aspirin 81 MG chewable tablet Chew 1 tablet (81 mg total) by mouth daily.   butalbital-acetaminophen-caffeine 50-325-40 MG tablet Commonly known as:  FIORICET, ESGIC Take 1 tablet by mouth every 6 (six) hours as needed for headache or migraine.   carvedilol 12.5 MG tablet Commonly known as:  COREG TAKE 1 TABLET BY MOUTH TWICE DAILY WITH A MEAL   chlorthalidone 25 MG tablet Commonly known as:  HYGROTON Take 1 tablet (25 mg total) by mouth daily.   cycloSPORINE 0.05 % ophthalmic emulsion Commonly known as:  RESTASIS Place 1 drop into both eyes 2 (two) times daily.   hydrALAZINE 50 MG tablet Commonly known as:  APRESOLINE Take 50 mg by mouth daily.   HYDROcodone-acetaminophen 5-325 MG tablet Commonly known as:  NORCO/VICODIN Take 1 tablet by mouth every 6 (six) hours as needed.   levothyroxine 112 MCG tablet Commonly known as:  SYNTHROID, LEVOTHROID TAKE ONE TABLET BY MOUTH ONCE DAILY BEFORE BREAKFAST   spironolactone 50 MG tablet Commonly  known as:  ALDACTONE Take 1 tablet (50 mg total) by mouth daily.         Review of Systems  Constitutional: Positive for weight loss and weight gain.       He has gained about 10 pounds this year.  She thinks she is watching her diet exercise: walking  30 min and toning  HENT: Negative for trouble swallowing.   Respiratory: Negative for daytime sleepiness.   Gastrointestinal: Negative for constipation.  Endocrine: Negative for fatigue and cold intolerance.       Getting some hot flashes now  Musculoskeletal: Positive for joint pain.  Skin: Positive for dry skin.       Nails tend to break.  No hair loss  Psychiatric/Behavioral: Positive for insomnia.       Has difficulty sleeping mostly because of pain   HYPERTENSION: Long-standing and managed by PCP and cardiologist  BP Readings from Last 3 Encounters:  04/09/18 (!) 172/92  03/11/18 (!) 162/61  02/06/18 (!) 178/95                Examination:    BP (!) 172/92   Pulse 70   Ht 5\' 5"  (1.651 m)   Wt 206 lb (93.4 kg)   SpO2 95%   BMI 34.28 kg/m   GENERAL:  Large build.  Has generalized obesity  No pallor.    Skin:  no rash or significant skin lesions.  EYES:  No prominence of the eyes or swelling of the eyelids  ENT: Oral mucosa and tongue normal.  NECK: No lymphadenopathy  THYROID:  Not palpable.  HEART:  Normal  S1 and S2; no murmur or click.  CHEST:    Lungs: Vescicular breath sounds heard equally.  No crepitations/ wheeze.  ABDOMEN:  No distention.  Liver and spleen not palpable.  No other mass or tenderness.  NEUROLOGICAL: Reflexes are bilaterally normal at biceps, difficult to elicit at ankles.  EXTREMITIES:  Normal peripheral joints.  No ankle edema present   Assessment:  HYPOTHYROIDISM, post ablative since 2007  Although she had radioactive treatment for her hyperthyroidism not clear if she had Graves' disease or toxic nodular goiter However requiring only moderate doses of thyroid supplement  for her weight Thyroid levels have been relatively stable since 2017 when the dose was increased up to 112 mcg  She is compliant with her supplement Her main complaint is weight gain although she is having issues with pain, insomnia and possible depression Also did stop smoking earlier this year potentially causing weight gain Discussed with the patient that since she has no other symptoms of hypothyroidism such as fatigue it is unlikely that her weight gain is related to her hypothyroidism  PLAN:  Check thyroid levels and decide on dose of the thyroid hormone She may benefit from a bariatric specialist for weight loss  Follow-up   Elayne Snare 04/09/2018, 2:41 PM   Consultation note copy sent to the PCP  Note: This office note was prepared with Dragon voice recognition system technology. Any transcriptional errors that result from this process are unintentional.  Addendum: TSH is 6.3, she will go up to 125 mcg levothyroxine and follow-up in 2 months Also since free T3 level is in the upper normal range she will not need to change treatment plan and continue levothyroxine only  Elayne Snare

## 2018-04-09 ENCOUNTER — Telehealth: Payer: Self-pay | Admitting: Family Medicine

## 2018-04-09 ENCOUNTER — Telehealth: Payer: Self-pay | Admitting: Endocrinology

## 2018-04-09 ENCOUNTER — Encounter: Payer: Self-pay | Admitting: Endocrinology

## 2018-04-09 ENCOUNTER — Encounter

## 2018-04-09 ENCOUNTER — Encounter: Payer: Self-pay | Admitting: Family Medicine

## 2018-04-09 ENCOUNTER — Ambulatory Visit (INDEPENDENT_AMBULATORY_CARE_PROVIDER_SITE_OTHER): Payer: BLUE CROSS/BLUE SHIELD | Admitting: Endocrinology

## 2018-04-09 VITALS — BP 172/92 | HR 70 | Ht 65.0 in | Wt 206.0 lb

## 2018-04-09 DIAGNOSIS — R635 Abnormal weight gain: Secondary | ICD-10-CM | POA: Diagnosis not present

## 2018-04-09 DIAGNOSIS — E890 Postprocedural hypothyroidism: Secondary | ICD-10-CM

## 2018-04-09 LAB — T4, FREE: Free T4: 0.74 ng/dL (ref 0.60–1.60)

## 2018-04-09 LAB — T3, FREE: T3, Free: 3.8 pg/mL (ref 2.3–4.2)

## 2018-04-09 LAB — TSH: TSH: 6.26 u[IU]/mL — ABNORMAL HIGH (ref 0.35–4.50)

## 2018-04-09 NOTE — Telephone Encounter (Signed)
I called to discuss her symptoms based on the MyChart message she sent to me. As discussed with her, I will prefer a full assessment and then we can check her for clots if appropriate.  I offered an appointment for her to come in this Friday. However, she is not available till next Friday the 25th. Appointment scheduled.  I advised her to go to the ED if her symptoms worsens and not wait till the 25th to see me. She verbalized understanding and agreed with the plan.

## 2018-04-09 NOTE — Telephone Encounter (Signed)
Her thyroid level is now slightly low and will need to change her prescription to 125 mcg levothyroxine and follow-up in about 6 weeks with labs

## 2018-04-09 NOTE — ED Provider Notes (Signed)
Note copied and pasted in blank note from 8/13 as unable to co-sign PA note in the chart.    Patient placed in Quick Look pathway, seen and evaluated   Chief Complaint: Body aches and nausea  HPI:   Pt complains of nausea.  Pt had leg swelling several days ago.  No dvt on   ROS: no fever, no chest pain   Physical Exam:              Gen: No distress             Neuro: Awake and Alert             Skin: Warm                          Focused Exam: Lungs clear, Heart rrr    Initiation of care has begun. The patient has been counseled on the process, plan, and necessity for staying for the completion/evaluation, and the remainder of the medical screening examination   Sidney Ace 02/06/18 2005  Medical screening examination/treatment/procedure(s) were performed by non-physician practitioner and as supervising physician I was immediately available for consultation/collaboration.  EKG Interpretation  Date/Time:  Tuesday February 06 2018 19:37:59 EDT Ventricular Rate:  70 PR Interval:  150 QRS Duration: 82 QT Interval:  406 QTC Calculation: 438 R Axis:   22 Text Interpretation:  Normal sinus rhythm Possible Left atrial enlargement Left ventricular hypertrophy with repolarization abnormality Abnormal ECG No significant change was found Confirmed by Jola Schmidt [phone]) on 02/07/2018 6:35:59 PM     Elnora Morrison, MD 04/09/18 [phone]

## 2018-04-10 MED ORDER — LEVOTHYROXINE SODIUM 125 MCG PO TABS: 125.0000 ug | ORAL_TABLET | Freq: Every day | ORAL | 2 refills | Status: DC

## 2018-04-10 NOTE — Telephone Encounter (Signed)
LVM requesting patient to call back to discuss note below

## 2018-04-12 NOTE — Telephone Encounter (Signed)
Please contact patient and if not able to reach her send a letter and also schedule as below

## 2018-04-12 NOTE — Telephone Encounter (Signed)
Since the medication change his required will need to try and call her again sooner than once a week

## 2018-04-12 NOTE — Telephone Encounter (Signed)
Protocol is calling patients once a week then letter is sent upon the 3rd week. [name] if this patient is not scheduled by next week we just need to call her again  Thank you

## 2018-04-13 ENCOUNTER — Encounter: Payer: Self-pay | Admitting: Cardiovascular Disease

## 2018-04-13 ENCOUNTER — Ambulatory Visit (INDEPENDENT_AMBULATORY_CARE_PROVIDER_SITE_OTHER): Payer: BLUE CROSS/BLUE SHIELD | Admitting: Cardiovascular Disease

## 2018-04-13 VITALS — BP 194/72 | HR 81 | Ht 65.0 in | Wt 208.0 lb

## 2018-04-13 DIAGNOSIS — I10 Essential (primary) hypertension: Secondary | ICD-10-CM

## 2018-04-13 DIAGNOSIS — E7800 Pure hypercholesterolemia, unspecified: Secondary | ICD-10-CM | POA: Diagnosis not present

## 2018-04-13 DIAGNOSIS — I639 Cerebral infarction, unspecified: Secondary | ICD-10-CM | POA: Diagnosis not present

## 2018-04-13 DIAGNOSIS — I5032 Chronic diastolic (congestive) heart failure: Secondary | ICD-10-CM | POA: Diagnosis not present

## 2018-04-13 MED ORDER — CARVEDILOL 25 MG PO TABS: 25.0000 mg | ORAL_TABLET | Freq: Two times a day (BID) | ORAL | 3 refills | Status: DC

## 2018-04-13 MED ORDER — HYDRALAZINE HCL 50 MG PO TABS: 50.0000 mg | ORAL_TABLET | Freq: Three times a day (TID) | ORAL | 3 refills | Status: DC

## 2018-04-13 NOTE — Telephone Encounter (Signed)
Patient has been notified and has f/u on 12/4

## 2018-04-13 NOTE — Patient Instructions (Addendum)
Medication Instructions:  INCREASE YOUR HYDRALAZINE TO 50 MG THREE TIMES A DAY   INCREASE YOUR CARVEDILOL TO 25 MG TWICE A DAY   If you need a refill on your cardiac medications before your next appointment, please call your pharmacy.   Lab work: NONE   Testing/Procedures: NONE  Follow-Up: At [facility], you and your health needs are our priority.  As part of our continuing mission to provide you with exceptional heart care, we have created designated Provider Care Teams.  These Care Teams include your primary Cardiologist (physician) and Advanced Practice Providers (APPs -  Physician Assistants and Nurse Practitioners) who all work together to provide you with the care you need, when you need it. You will need a follow up appointment in 3 months. You may see DR [facility]  or one of the following Advanced Practice Providers on your designated Care Team:   Kerin Ransom, PA-C Roby Lofts, [place] . Sande Rives, PA-C  Your physician recommends that you schedule a follow-up appointment in: [place] D FOR BLOOD PRESSURE   MONITOR YOUR BLOOD PRESSURE AT HOME, IF IT REMAINS ABOVE 130/80 INCREASE YOUR HYDRALAZINE TO 100 MG THREE TIMES A DAY   WILL HAVE PHARMACIST CALL YOU REGARDING REPATHA

## 2018-04-13 NOTE — Progress Notes (Signed)
Cardiology Office Note   Date:  04/13/2018   ID:  Teresa [name], DOB 1961-06-19, MRN [id number]  PCP:  Teresa Feil, MD  Cardiologist:   Teresa Latch, MD   Chief Complaint  Patient presents with  . Follow-up    3 months  . Headache  . Shortness of Breath     History of Present Illness: Teresa [name] is a 57 y.o. female with  stroke, hypertension, palpitations, moderate LVH, and hyperthyroidism who presents for follow up.  Teresa [name] saw Teresa Mews, MD, on 01/05/16.  At that appointment she reported occasional palpitations and burning in her chest.  She had an echo on 7/13 showed LVEF 55-60% with grade 2 diastolic dysfunction and mild MR.  Teresa [name] reports one year of palpitations and chest pain that occurs sporadically.  She was referred for [facility] 02/24/16 that revealed LVEF 47% and no ischemia.  At her appointment on 01/2016 her blood pressure was elevated so she was switched from metoprolol to carvedilol.  She was subsequently diagnosed with cervicalgia.  Teresa [name] presented 08/2017 with cryptogenic embolic stroke.  Echo at that time revealed LVEF 60 to 65% with grade 1 diastolic dysfunction and moderate aortic regurgitation She had a TEE that was negative for left atrial appendage thrombus and her aortic regurgitation was noted to be mild.  A loop recorder was implanted and no significant arrhythmias have been recorded thus far.   Amlodipine was discontinued due to swelling.   At her last appointment chlorthalidone was added.  Hydralazine was switched from qid to tid.  She was referred to pharmacy to start [place].  She saw our pharmacists 01/2018 and switched Zetia to [place].  However she never received the medication.  She has been exercising daily and walks for 30 minutes followed by using for exercise machines.  She has no exertional chest pain or shortness of breath.  She feels great with exercise.  She has no lower extremity edema, orthopnea, or PND.  Her main  complaint is pain throughout the left side of her body.  This is been ongoing since August.  She had an MRI of her brain which did not reveal any etiology.  She also complains of spasms in her neck.  At some point she started taking hydralazine once daily instead of 3 times a day.  She is unsure why this change was made.  She is been stressed lately because she is caring for her mother who was recently diagnosed with lung cancer.  Past Medical History:  Diagnosis Date  . Allergy   . Anemia   . Cervical disc disorder with radiculopathy of cervical region 02/10/2016  . Chronic lower back pain   . Diverticulosis   . EKG, abnormal 01/06/2017  . Gastritis   . GERD (gastroesophageal reflux disease)   . H/O hiatal hernia   . Hiatal hernia   . History of amputation of lesser toe of left foot ([place]) 03/11/2015  . History of colon cancer   . Hypertension   . Hyperthyroidism    hx  . Hypothyroidism   . Migraine headache    "a few times/yr" (03/05/2015)  . Neck strain 02/06/2012  . Osteomyelitis ([place])    Teresa [name] 03/05/2015  . Osteomyelitis of toe of left foot ([place]) 03/05/2015  . Palpitations 01/05/2016  . Positive TB test   . Stroke ([place])   . Tubular adenoma of colon   . Tubular adenoma of colon     Past Surgical History:  Procedure Laterality Date  . ABDOMINAL HYSTERECTOMY  2006  . AMPUTATION TOE Left 03/05/2015   Procedure: AMPUTATION LEFT 5TH  TOE;  Surgeon: Wylene Simmer, MD;  Location: [facility];  Service: Orthopedics;  Laterality: Left;  . APPENDECTOMY  2006  . BREAST BIOPSY Left 11/26/2014  . [place]  . COLONOSCOPY    . GALLBLADDER SURGERY    . LOOP RECORDER INSERTION N/A 09/18/2017   Procedure: LOOP RECORDER INSERTION;  Surgeon: Evans Lance, MD;  Location: [place] [facility];  Service: Cardiovascular;  Laterality: N/A;  . TEE WITHOUT CARDIOVERSION N/A 09/18/2017   Procedure: TRANSESOPHAGEAL ECHOCARDIOGRAM (TEE);  Surgeon: Pixie Casino, MD;  Location: [facility] ENDOSCOPY;   Service: Cardiovascular;  Laterality: N/A;  . TOE AMPUTATION Left 03/05/2015   5th toe  . TUBAL LIGATION Bilateral 1990  . UPPER GASTROINTESTINAL ENDOSCOPY       Current Outpatient Medications  Medication Sig Dispense Refill  . aspirin 81 MG chewable tablet Chew 1 tablet (81 mg total) by mouth daily. 30 tablet 0  . butalbital-acetaminophen-caffeine (FIORICET, ESGIC) 50-325-40 MG tablet Take 1 tablet by mouth every 6 (six) hours as needed for headache or migraine. 20 tablet 0  . carvedilol (COREG) 25 MG tablet Take 1 tablet (25 mg total) by mouth 2 (two) times daily with a meal. 180 tablet 3  . cycloSPORINE (RESTASIS) 0.05 % ophthalmic emulsion Place 1 drop into both eyes 2 (two) times daily.     . hydrALAZINE (APRESOLINE) 50 MG tablet Take 1 tablet (50 mg total) by mouth 3 (three) times daily. 270 tablet 3  . HYDROcodone-acetaminophen (NORCO/VICODIN) 5-325 MG tablet Take 1 tablet by mouth every 6 (six) hours as needed. 12 tablet 0  . levothyroxine (SYNTHROID, LEVOTHROID) 125 MCG tablet Take 1 tablet (125 mcg total) by mouth daily. 30 tablet 2  . spironolactone (ALDACTONE) 50 MG tablet Take 1 tablet (50 mg total) by mouth daily. 90 tablet 1   Current Facility-Administered Medications  Medication Dose Route Frequency Provider Last Rate Last Dose  . olopatadine (PATANOL) 0.1 % ophthalmic solution 1 drop  1 drop Both Eyes BID McDiarmid, Blane Ohara, MD        Allergies:   Bactrim [sulfamethoxazole-trimethoprim]; Clonidine derivatives; Amlodipine; Lisinopril; Statins; Aspirin; and Latex    Social History:  The patient  reports that she quit smoking about 6 months ago. Her smoking use included cigarettes. She has a 18.00 pack-year smoking history. She has never used smokeless tobacco. She reports that she does not drink alcohol or use drugs.   Family History:  The patient's family history includes Breast cancer in her maternal aunt and paternal aunt; Cancer (age of onset: 56) in her father; Diabetes  in her brother, mother, other, and sister; Heart disease in her mother and other; Kidney disease in her brother and sister; Stroke in her father.    ROS:  Please see the history of present illness.   Otherwise, review of systems are positive for neck pain.   All other systems are reviewed and negative.    PHYSICAL EXAM: VS:  BP (!) 194/72   Pulse 81   Ht 5\' 5"  (1.651 m)   Wt 208 lb (94.3 kg)   BMI 34.61 kg/m  , BMI Body mass index is 34.61 kg/m. GENERAL:  Well appearing HEENT: Pupils equal round and reactive, fundi not visualized, oral mucosa unremarkable NECK:  No jugular venous distention, waveform within normal limits, carotid upstroke brisk and symmetric, no bruits  LUNGS:  Clear to auscultation bilaterally HEART:  RRR.  PMI not displaced or sustained,S1 and S2 within normal limits, no S3, no S4, no clicks, no rubs, no murmurs ABD:  Flat, positive bowel sounds normal in frequency in pitch, no bruits, no rebound, no guarding, no midline pulsatile mass, no hepatomegaly, no splenomegaly EXT:  2 plus pulses throughout, no edema, no cyanosis no clubbing SKIN:  No rashes no nodules NEURO:  Cranial nerves II through XII grossly intact, motor grossly intact throughout PSYCH:  Cognitively intact, oriented to person place and time   EKG:  EKG is not ordered today. The ekg ordered 02/11/16 demonstrates sinus bradycardia.  Rate 59 bpm.  Inferolateral TWI, concerning for inferolateral ischemia.  Unchanged from 03/09/15.  Lexiscan Myoview 02/24/16:  Nuclear stress EF: 47% by computer calculations. Visually , the EF is normal and is around 55-60%.  There was no ST segment deviation noted during stress.  The study is normal.  This is a low risk study.  Echo 09/16/17: Study Conclusions  - Left ventricle: The cavity size was normal. There was moderate   concentric hypertrophy. Systolic function was normal. The   estimated ejection fraction was in the range of 60% to 65%. Frosch   motion was  normal; there were no regional Santosuosso motion   abnormalities. Doppler parameters are consistent with abnormal   left ventricular relaxation (grade 1 diastolic dysfunction).   Doppler parameters are consistent with elevated ventricular   end-diastolic filling pressure. - Aortic valve: There was moderate regurgitation. - Mitral valve: Calcified annulus. Mildly thickened leaflets .   There was mild regurgitation. - Left atrium: The atrium was mildly dilated. - Right ventricle: Systolic function was normal. - Tricuspid valve: There was mild regurgitation. - Pulmonic valve: There was trivial regurgitation. - Pulmonary arteries: The main pulmonary artery was normal-sized.   Systolic pressure was within the normal range. - Inferior vena cava: The vessel was normal in size. - Pericardium, extracardiac: There was no pericardial effusion.   Recent Labs: 03/11/2018: ALT 32; BUN 14; Creatinine, Ser 1.05; Hemoglobin 13.8; Platelets 294; Potassium 4.2; Sodium 140 04/09/2018: TSH 6.26    Lipid Panel    Component Value Date/Time   CHOL 149 02/20/2018 1100   TRIG 50 02/20/2018 1100   HDL 46 02/20/2018 1100   CHOLHDL 3.2 02/20/2018 1100   CHOLHDL 4.3 09/15/2017 1217   VLDL 13 09/15/2017 1217   LDLCALC 93 02/20/2018 1100      Wt Readings from Last 3 Encounters:  04/13/18 208 lb (94.3 kg)  04/09/18 206 lb (93.4 kg)  03/11/18 200 lb (90.7 kg)      ASSESSMENT AND PLAN:  # Atypical chest pain: Resolved. Lexiscan Myoview was negative.   # Hypertension: BP remains poorly controlled.  It is unclear why her hydralazine has switched to only once per day.  We will resume 50 mg 3 times daily.  Also increase carvedilol to 25 mg twice a day.  She will check her blood pressures at home and if her blood pressure remains elevated she will increase hydralazine to 100 mg 3 times a day.  # Embolic stroke: Idiopathic.  Loop recorder in place.  Hypertension and lipid management as above.   # Hyperlipidemia:  We will follow-up with pharmacy regarding Repatha.  # Chronic diastolic heart failure: Ms. Ballester appears euvolemic on exam.  BP control as above.    # Palpitations: Stable.  # OSA: Not severe enough for CPAP 03/2016.   Current medicines are reviewed  at length with the patient today.  The patient does not have concerns regarding medicines.  The following changes have been made:  Increase carvedilol and hydralazine.  Labs/ tests ordered today include:   No orders of the defined types were placed in this encounter.    Disposition:   FU with Parneet Glantz C. Oval Linsey, MD, [facility] in 3 months.  Pharm.D. in 1 month.   Signed, Joseluis Alessio C. Oval Linsey, MD, [facility]  04/13/2018 1:59 PM    [facility]

## 2018-04-18 ENCOUNTER — Encounter: Payer: Self-pay | Admitting: Family Medicine

## 2018-04-20 ENCOUNTER — Encounter: Payer: Self-pay | Admitting: Family Medicine

## 2018-04-20 ENCOUNTER — Other Ambulatory Visit: Payer: Self-pay

## 2018-04-20 ENCOUNTER — Ambulatory Visit (INDEPENDENT_AMBULATORY_CARE_PROVIDER_SITE_OTHER): Payer: BLUE CROSS/BLUE SHIELD | Admitting: Family Medicine

## 2018-04-20 VITALS — BP 157/70 | HR 74 | Temp 98.2°F | Wt 207.0 lb

## 2018-04-20 DIAGNOSIS — M791 Myalgia, unspecified site: Secondary | ICD-10-CM

## 2018-04-20 DIAGNOSIS — M2550 Pain in unspecified joint: Secondary | ICD-10-CM | POA: Diagnosis not present

## 2018-04-20 DIAGNOSIS — R0600 Dyspnea, unspecified: Secondary | ICD-10-CM | POA: Insufficient documentation

## 2018-04-20 DIAGNOSIS — I10 Essential (primary) hypertension: Secondary | ICD-10-CM

## 2018-04-20 DIAGNOSIS — R7303 Prediabetes: Secondary | ICD-10-CM

## 2018-04-20 DIAGNOSIS — R7309 Other abnormal glucose: Secondary | ICD-10-CM

## 2018-04-20 DIAGNOSIS — E669 Obesity, unspecified: Secondary | ICD-10-CM

## 2018-04-20 DIAGNOSIS — H00019 Hordeolum externum unspecified eye, unspecified eyelid: Secondary | ICD-10-CM | POA: Insufficient documentation

## 2018-04-20 DIAGNOSIS — Z6832 Body mass index (BMI) 32.0-32.9, adult: Secondary | ICD-10-CM

## 2018-04-20 DIAGNOSIS — R0602 Shortness of breath: Secondary | ICD-10-CM

## 2018-04-20 DIAGNOSIS — Z23 Encounter for immunization: Secondary | ICD-10-CM

## 2018-04-20 DIAGNOSIS — H00014 Hordeolum externum left upper eyelid: Secondary | ICD-10-CM

## 2018-04-20 LAB — POCT GLYCOSYLATED HEMOGLOBIN (HGB A1C): HbA1c, POC (controlled diabetic range): 6 % (ref 0.0–7.0)

## 2018-04-20 LAB — POCT SEDIMENTATION RATE: POCT SED RATE: 6 mm/hr (ref 0–22)

## 2018-04-20 MED ORDER — ALBUTEROL SULFATE HFA 108 (90 BASE) MCG/ACT IN AERS: 2.0000 | INHALATION_SPRAY | Freq: Four times a day (QID) | RESPIRATORY_TRACT | 2 refills | Status: DC | PRN

## 2018-04-20 MED ORDER — METFORMIN HCL 500 MG PO TABS: 500.0000 mg | ORAL_TABLET | Freq: Every day | ORAL | 1 refills | Status: DC

## 2018-04-20 NOTE — Assessment & Plan Note (Signed)
Not interested in nutrition and weight management referral. She will continue home exercise and diet. Will like to start Metformin for preDM which may also help with weight loss. TSH reviewed. Synthroid dose was recently increased by her endo due to elevated TSH.

## 2018-04-20 NOTE — Patient Instructions (Signed)
It was nice seeing you today. Please schedule PFT with Dr. Valentina Lucks. I will call you soon with your results.  Calorie Counting for Weight Loss Calories are units of energy. Your body needs a certain amount of calories from food to keep you going throughout the day. When you eat more calories than your body needs, your body stores the extra calories as fat. When you eat fewer calories than your body needs, your body burns fat to get the energy it needs. Calorie counting means keeping track of how many calories you eat and drink each day. Calorie counting can be helpful if you need to lose weight. If you make sure to eat fewer calories than your body needs, you should lose weight. Ask your health care provider what a healthy weight is for you. For calorie counting to work, you will need to eat the right number of calories in a day in order to lose a healthy amount of weight per week. A dietitian can help you determine how many calories you need in a day and will give you suggestions on how to reach your calorie goal.  A healthy amount of weight to lose per week is usually 1-2 lb (0.5-0.9 kg). This usually means that your daily calorie intake should be reduced by 500-750 calories.  Eating 1,200 - 1,500 calories per day can help most women lose weight.  Eating 1,500 - 1,800 calories per day can help most men lose weight.  What is my plan? My goal is to have __________ calories per day. If I have this many calories per day, I should lose around __________ pounds per week. What do I need to know about calorie counting? In order to meet your daily calorie goal, you will need to:  Find out how many calories are in each food you would like to eat. Try to do this before you eat.  Decide how much of the food you plan to eat.  Write down what you ate and how many calories it had. Doing this is called keeping a food log.  To successfully lose weight, it is important to balance calorie counting with a  healthy lifestyle that includes regular activity. Aim for 150 minutes of moderate exercise (such as walking) or 75 minutes of vigorous exercise (such as running) each week. Where do I find calorie information?  The number of calories in a food can be found on a Nutrition Facts label. If a food does not have a Nutrition Facts label, try to look up the calories online or ask your dietitian for help. Remember that calories are listed per serving. If you choose to have more than one serving of a food, you will have to multiply the calories per serving by the amount of servings you plan to eat. For example, the label on a package of bread might say that a serving size is 1 slice and that there are 90 calories in a serving. If you eat 1 slice, you will have eaten 90 calories. If you eat 2 slices, you will have eaten 180 calories. How do I keep a food log? Immediately after each meal, record the following information in your food log:  What you ate. Don't forget to include toppings, sauces, and other extras on the food.  How much you ate. This can be measured in cups, ounces, or number of items.  How many calories each food and drink had.  The total number of calories in the meal.  Keep your  food log near you, such as in a small notebook in your pocket, or use a mobile app or website. Some programs will calculate calories for you and show you how many calories you have left for the day to meet your goal. What are some calorie counting tips?  Use your calories on foods and drinks that will fill you up and not leave you hungry: ? Some examples of foods that fill you up are nuts and nut butters, vegetables, lean proteins, and high-fiber foods like whole grains. High-fiber foods are foods with more than 5 g fiber per serving. ? Drinks such as sodas, specialty coffee drinks, alcohol, and juices have a lot of calories, yet do not fill you up.  Eat nutritious foods and avoid empty calories. Empty calories  are calories you get from foods or beverages that do not have many vitamins or protein, such as candy, sweets, and soda. It is better to have a nutritious high-calorie food (such as an avocado) than a food with few nutrients (such as a bag of chips).  Know how many calories are in the foods you eat most often. This will help you calculate calorie counts faster.  Pay attention to calories in drinks. Low-calorie drinks include water and unsweetened drinks.  Pay attention to nutrition labels for "low fat" or "fat free" foods. These foods sometimes have the same amount of calories or more calories than the full fat versions. They also often have added sugar, starch, or salt, to make up for flavor that was removed with the fat.  Find a way of tracking calories that works for you. Get creative. Try different apps or programs if writing down calories does not work for you. What are some portion control tips?  Know how many calories are in a serving. This will help you know how many servings of a certain food you can have.  Use a measuring cup to measure serving sizes. You could also try weighing out portions on a kitchen scale. With time, you will be able to estimate serving sizes for some foods.  Take some time to put servings of different foods on your favorite plates, bowls, and cups so you know what a serving looks like.  Try not to eat straight from a bag or box. Doing this can lead to overeating. Put the amount you would like to eat in a cup or on a plate to make sure you are eating the right portion.  Use smaller plates, glasses, and bowls to prevent overeating.  Try not to multitask (for example, watch TV or use your computer) while eating. If it is time to eat, sit down at a table and enjoy your food. This will help you to know when you are full. It will also help you to be aware of what you are eating and how much you are eating. What are tips for following this plan? Reading food  labels  Check the calorie count compared to the serving size. The serving size may be smaller than what you are used to eating.  Check the source of the calories. Make sure the food you are eating is high in vitamins and protein and low in saturated and trans fats. Shopping  Read nutrition labels while you shop. This will help you make healthy decisions before you decide to purchase your food.  Make a grocery list and stick to it. Cooking  Try to cook your favorite foods in a healthier way. For example, try baking  instead of frying.  Use low-fat dairy products. Meal planning  Use more fruits and vegetables. Half of your plate should be fruits and vegetables.  Include lean proteins like poultry and fish. How do I count calories when eating out?  Ask for smaller portion sizes.  Consider sharing an entree and sides instead of getting your own entree.  If you get your own entree, eat only half. Ask for a box at the beginning of your meal and put the rest of your entree in it so you are not tempted to eat it.  If calories are listed on the menu, choose the lower calorie options.  Choose dishes that include vegetables, fruits, whole grains, low-fat dairy products, and lean protein.  Choose items that are boiled, broiled, grilled, or steamed. Stay away from items that are buttered, battered, fried, or served with cream sauce. Items labeled "crispy" are usually fried, unless stated otherwise.  Choose water, low-fat milk, unsweetened iced tea, or other drinks without added sugar. If you want an alcoholic beverage, choose a lower calorie option such as a glass of wine or light beer.  Ask for dressings, sauces, and syrups on the side. These are usually high in calories, so you should limit the amount you eat.  If you want a salad, choose a garden salad and ask for grilled meats. Avoid extra toppings like bacon, cheese, or fried items. Ask for the dressing on the side, or ask for olive oil  and vinegar or lemon to use as dressing.  Estimate how many servings of a food you are given. For example, a serving of cooked rice is  cup or about the size of half a baseball. Knowing serving sizes will help you be aware of how much food you are eating at restaurants. The list below tells you how big or small some common portion sizes are based on everyday objects: ? 1 oz-4 stacked dice. ? 3 oz-1 deck of cards. ? 1 tsp-1 die. ? 1 Tbsp- a ping-pong ball. ? 2 Tbsp-1 ping-pong ball. ?  cup- baseball. ? 1 cup-1 baseball. Summary  Calorie counting means keeping track of how many calories you eat and drink each day. If you eat fewer calories than your body needs, you should lose weight.  A healthy amount of weight to lose per week is usually 1-2 lb (0.5-0.9 kg). This usually means reducing your daily calorie intake by 500-750 calories.  The number of calories in a food can be found on a Nutrition Facts label. If a food does not have a Nutrition Facts label, try to look up the calories online or ask your dietitian for help.  Use your calories on foods and drinks that will fill you up, and not on foods and drinks that will leave you hungry.  Use smaller plates, glasses, and bowls to prevent overeating. This information is not intended to replace advice given to you by your health care provider. Make sure you discuss any questions you have with your health care provider. Document Released: 06/13/2005 Document Revised: 05/13/2016 Document Reviewed: 05/13/2016 Elsevier Interactive Patient Education  [employer].

## 2018-04-20 NOTE — Assessment & Plan Note (Signed)
A1C stable. We will start Metformin also for weight loss. Continue diet and exercise as before.

## 2018-04-20 NOTE — Assessment & Plan Note (Signed)
Quit Smoking. ?? COPD. Albuterol prn SOB prescribed. PFT recommended. She will schedule with Dr. Valentina Lucks. Cardiac eval completed in the past and was not impressive., She had sleep study done in 2017 without a diagnosis of sleep apnea. PFT as planned.

## 2018-04-20 NOTE — Assessment & Plan Note (Signed)
With myalgia. Recent ANA neg. ESR,CRP checked to r/o PMR. Rheumatoid Arthritis panel ordered. I will contact her with the result.

## 2018-04-20 NOTE — Assessment & Plan Note (Signed)
Warm compress recommended.

## 2018-04-20 NOTE — Progress Notes (Signed)
Subjective:     Patient ID: Teresa [name], female   DOB: 1960-10-30, 57 y.o.   MRN: [id number]  Shortness of Breath  This is a chronic problem. The current episode started more than 1 year ago. The problem occurs intermittently. The problem has been waxing and waning. Associated symptoms include leg swelling. Pertinent negatives include no chest pain, fever, neck pain, vomiting or wheezing. Nothing aggravates the symptoms. Associated symptoms comments: Last episode was yesterday. No SOB now. At times she feels flattering her her chest, no chest pain.. She has tried nothing for the symptoms.  Weight Gain:Gaining weight despite exercise and diet control. She is upset about this. She feels something metabolic is going on. Stye:Woke up this morning with bump and mild pain on her left upper eyelid. HTN: She is compliant with her meds. She ran out of her Aldactone yesterday and is yet to pick it up. She does not monitor her BP at home regularly. Never started the Chlorthalidone PreDM:Here for f/u Pain: C/O generalized body aches on going for months. Pain waxes and wane. Occasional joint swelling especially her ankle. No ankle swelling today.  Current Outpatient Medications on File Prior to Visit  Medication Sig Dispense Refill  . aspirin 81 MG chewable tablet Chew 1 tablet (81 mg total) by mouth daily. 30 tablet 0  . butalbital-acetaminophen-caffeine (FIORICET, ESGIC) 50-325-40 MG tablet Take 1 tablet by mouth every 6 (six) hours as needed for headache or migraine. 20 tablet 0  . carvedilol (COREG) 25 MG tablet Take 1 tablet (25 mg total) by mouth 2 (two) times daily with a meal. 180 tablet 3  . cycloSPORINE (RESTASIS) 0.05 % ophthalmic emulsion Place 1 drop into both eyes 2 (two) times daily.     . hydrALAZINE (APRESOLINE) 50 MG tablet Take 1 tablet (50 mg total) by mouth 3 (three) times daily. 270 tablet 3  . HYDROcodone-acetaminophen (NORCO/VICODIN) 5-325 MG tablet Take 1 tablet by mouth every 6  (six) hours as needed. 12 tablet 0  . levothyroxine (SYNTHROID, LEVOTHROID) 125 MCG tablet Take 1 tablet (125 mcg total) by mouth daily. 30 tablet 2  . spironolactone (ALDACTONE) 50 MG tablet Take 1 tablet (50 mg total) by mouth daily. 90 tablet 1   Current Facility-Administered Medications on File Prior to Visit  Medication Dose Route Frequency Provider Last Rate Last Dose  . olopatadine (PATANOL) 0.1 % ophthalmic solution 1 drop  1 drop Both Eyes BID McDiarmid, Blane Ohara, MD       Past Medical History:  Diagnosis Date  . Allergy   . Anemia   . Cervical disc disorder with radiculopathy of cervical region 02/10/2016  . Chronic lower back pain   . Diverticulosis   . EKG, abnormal 01/06/2017  . Gastritis   . GERD (gastroesophageal reflux disease)   . H/O hiatal hernia   . Hiatal hernia   . History of amputation of lesser toe of left foot ([place]) 03/11/2015  . History of colon cancer   . Hypertension   . Hyperthyroidism    hx  . Hypothyroidism   . Lateral knee pain, left 08/06/2013  . Migraine headache    "a few times/yr" (03/05/2015)  . Neck strain 02/06/2012  . Osteomyelitis ([place])    [name] 03/05/2015  . Osteomyelitis of toe of left foot ([place]) 03/05/2015  . Palpitations 01/05/2016  . Positive TB test   . Stroke ([place])   . Tubular adenoma of colon   . Tubular adenoma of colon    Vitals:  04/20/18 0912 04/20/18 0936  BP: (!) 158/82 (!) 157/70  Pulse: 74   Temp: 98.2 F (36.8 C)   TempSrc: Oral   SpO2: 99%   Weight: 207 lb (93.9 kg)      Review of Systems  Constitutional: Negative for activity change, appetite change and fever.  Respiratory: Positive for shortness of breath. Negative for wheezing.   Cardiovascular: Positive for leg swelling. Negative for chest pain.  Gastrointestinal: Negative for vomiting.  Musculoskeletal: Positive for arthralgias, joint swelling and myalgias. Negative for gait problem, neck pain and neck stiffness.  Neurological: Negative.   All other systems  reviewed and are negative.      Objective:   Physical Exam  Constitutional: She is oriented to person, place, and time. She appears well-developed. No distress.  Neck: No thyromegaly present.  Cardiovascular: Normal rate, regular rhythm and normal heart sounds.  No murmur heard. Pulmonary/Chest: Effort normal and breath sounds normal. No stridor. No respiratory distress. She has no wheezes.  Abdominal: Soft. Bowel sounds are normal. She exhibits no distension and no mass. There is no tenderness. There is no guarding.  Musculoskeletal: Normal range of motion. She exhibits no edema.       Right ankle: Normal.       Left ankle: Normal.  Neurological: She is alert and oriented to person, place, and time. No cranial nerve deficit. Coordination normal.  Nursing note and vitals reviewed.      Assessment:     SOB Obesity Stye HTN PreDM Multiple joint pain    Plan:     Check problem list.

## 2018-04-20 NOTE — Assessment & Plan Note (Signed)
Off Aldactone today. She will pick up meds. Continue current regimen.

## 2018-04-22 LAB — RHEUMATOID ARTHRITIS PROFILE
Cyclic Citrullin Peptide Ab: 9 units (ref 0–19)
Rhuematoid fact SerPl-aCnc: <10 IU/mL (ref 0.0–13.9)

## 2018-04-22 LAB — C-REACTIVE PROTEIN: CRP: 5 mg/L (ref 0–10)

## 2018-04-22 LAB — CK: Total CK: 111 U/L (ref 24–173)

## 2018-04-22 LAB — MYOGLOBIN, SERUM: Myoglobin: 22 ng/mL — ABNORMAL LOW (ref 25–58)

## 2018-04-24 ENCOUNTER — Other Ambulatory Visit: Payer: Self-pay | Admitting: Pharmacist Clinician (PhC)/ Clinical Pharmacy Specialist

## 2018-04-24 MED ORDER — EVOLOCUMAB 140 MG/ML ~~LOC~~ SOAJ: 140.0000 mg | SUBCUTANEOUS | 12 refills | Status: DC

## 2018-05-04 ENCOUNTER — Ambulatory Visit (INDEPENDENT_AMBULATORY_CARE_PROVIDER_SITE_OTHER): Payer: BLUE CROSS/BLUE SHIELD | Admitting: *Deleted

## 2018-05-04 ENCOUNTER — Encounter: Payer: Self-pay | Admitting: Family Medicine

## 2018-05-04 DIAGNOSIS — I639 Cerebral infarction, unspecified: Secondary | ICD-10-CM

## 2018-05-06 ENCOUNTER — Telehealth: Payer: Self-pay | Admitting: Physician Assistant

## 2018-05-06 NOTE — Telephone Encounter (Signed)
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   Richardson Dopp, PA-C    05/06/2018 10:29 AM

## 2018-05-06 NOTE — Progress Notes (Signed)
[facility] Summary Report / Loop Recorder 

## 2018-05-13 ENCOUNTER — Encounter: Payer: Self-pay | Admitting: Family Medicine

## 2018-05-16 ENCOUNTER — Encounter: Payer: Self-pay | Admitting: Family Medicine

## 2018-05-16 ENCOUNTER — Other Ambulatory Visit: Payer: Self-pay

## 2018-05-16 ENCOUNTER — Ambulatory Visit (INDEPENDENT_AMBULATORY_CARE_PROVIDER_SITE_OTHER): Payer: BLUE CROSS/BLUE SHIELD | Admitting: Family Medicine

## 2018-05-16 VITALS — BP 146/90 | HR 71 | Temp 98.3°F | Ht 65.0 in | Wt 211.0 lb

## 2018-05-16 DIAGNOSIS — Z6832 Body mass index (BMI) 32.0-32.9, adult: Secondary | ICD-10-CM

## 2018-05-16 DIAGNOSIS — R200 Anesthesia of skin: Secondary | ICD-10-CM | POA: Insufficient documentation

## 2018-05-16 DIAGNOSIS — I10 Essential (primary) hypertension: Secondary | ICD-10-CM | POA: Diagnosis not present

## 2018-05-16 DIAGNOSIS — R609 Edema, unspecified: Secondary | ICD-10-CM

## 2018-05-16 DIAGNOSIS — M25512 Pain in left shoulder: Secondary | ICD-10-CM

## 2018-05-16 DIAGNOSIS — E039 Hypothyroidism, unspecified: Secondary | ICD-10-CM

## 2018-05-16 DIAGNOSIS — R202 Paresthesia of skin: Secondary | ICD-10-CM

## 2018-05-16 DIAGNOSIS — G8929 Other chronic pain: Secondary | ICD-10-CM

## 2018-05-16 DIAGNOSIS — E669 Obesity, unspecified: Secondary | ICD-10-CM

## 2018-05-16 HISTORY — DX: Paresthesia of skin: R20.0

## 2018-05-16 MED ORDER — FUROSEMIDE 20 MG PO TABS: 20.0000 mg | ORAL_TABLET | Freq: Every day | ORAL | 1 refills | Status: DC

## 2018-05-16 NOTE — Assessment & Plan Note (Signed)
She is concern that her Synthroid is not effective and will like to switch her meds. TSH rechecked today. F/U with endocrinologist as scheduled.

## 2018-05-16 NOTE — Assessment & Plan Note (Addendum)
Left shoulder xray ordered. Use Tylenol or Ibuprofen as needed for pain. I will call with result. PT in the future.

## 2018-05-16 NOTE — Assessment & Plan Note (Signed)
She is adamant about Coreg being the cause of her problems. She is yet to discuss meds with her cardiologist. She will like to d/c Coreg. We however reached a compromise to reduce dose into half I.e Coreg 12.5 mg BID. Start Lasix 20 mg qd Continue Aldactone 50 mg qd. Continue Hydralazine 50 mg TID. Monitor BP closely at home. F/U with cards as planned. See me back in 4 weeks for reassessment. Bmet checked today. We will recheck in 4 weeks to monitor her kidney functions. She agreed with the plan.

## 2018-05-16 NOTE — Progress Notes (Signed)
Subjective:     Patient ID: Teresa [name], female   DOB: [birth year], 57 y.o.   MRN: [id number]  HPI Edema:C/O ankle swelling intermittently which was worse last week. She feels like she is retaining fluid in her body. She has gained weight since last visit despite exercising everyday and diet change. She will like to get on water pills. She is also concern that Coreg is causing most of her problems since she read online about it. She also stated that her symptoms started worsening after she started Coreg. Weight gain:As above Shoulder pain: C/O left shoulder pain x 2 months. Denies right shoulder pain today. Pain is about 6/10 in severity, worse at night, improves with icing. She endorsed stiffness in AM, worse after work as well. Right hand numbness:C/O right hand numbness whenever she wakes up in the morning regardless of whether she sleeps on her hand or not. Denies symptoms now. CBJ:[id number] with Coreg 25 mg BID, Aldactone 50 mg qd,Hydralazine 50 mg TID. Here for f/u.  Hypothyroidism: Compliant with her synthroid.  Current Outpatient Medications on File Prior to Visit  Medication Sig Dispense Refill  . albuterol (PROVENTIL HFA;VENTOLIN HFA) 108 (90 Base) MCG/ACT inhaler Inhale 2 puffs into the lungs every 6 (six) hours as needed for wheezing or shortness of breath. 1 Inhaler 2  . aspirin 81 MG chewable tablet Chew 1 tablet (81 mg total) by mouth daily. 30 tablet 0  . butalbital-acetaminophen-caffeine (FIORICET, ESGIC) 50-325-40 MG tablet Take 1 tablet by mouth every 6 (six) hours as needed for headache or migraine. (Patient not taking: Reported on 04/20/2018) 20 tablet 0  . carvedilol (COREG) 25 MG tablet Take 1 tablet (25 mg total) by mouth 2 (two) times daily with a meal. 180 tablet 3  . cycloSPORINE (RESTASIS) 0.05 % ophthalmic emulsion Place 1 drop into both eyes 2 (two) times daily.     . Evolocumab (REPATHA SURECLICK) 160 MG/ML SOAJ Inject 140 mg into the skin every 14 (fourteen) days.  2 pen 12  . hydrALAZINE (APRESOLINE) 50 MG tablet Take 1 tablet (50 mg total) by mouth 3 (three) times daily. 270 tablet 3  . levothyroxine (SYNTHROID, LEVOTHROID) 125 MCG tablet Take 1 tablet (125 mcg total) by mouth daily. 30 tablet 2  . metFORMIN (GLUCOPHAGE) 500 MG tablet Take 1 tablet (500 mg total) by mouth daily with breakfast. 90 tablet 1  . Multiple Vitamins-Minerals (CENTRUM MULTIGUMMIES) CHEW Chew 2 tablets by mouth daily.    Marland Kitchen spironolactone (ALDACTONE) 50 MG tablet Take 1 tablet (50 mg total) by mouth daily. 90 tablet 1   Current Facility-Administered Medications on File Prior to Visit  Medication Dose Route Frequency Provider Last Rate Last Dose  . olopatadine (PATANOL) 0.1 % ophthalmic solution 1 drop  1 drop Both Eyes BID McDiarmid, Blane Ohara, MD       Past Medical History:  Diagnosis Date  . Allergy   . Anemia   . Cervical disc disorder with radiculopathy of cervical region 02/10/2016  . Chronic lower back pain   . Diverticulosis   . EKG, abnormal 01/06/2017  . Gastritis   . GERD (gastroesophageal reflux disease)   . H/O hiatal hernia   . Hiatal hernia   . History of amputation of lesser toe of left foot ([place]) 03/11/2015  . History of colon cancer   . Hypertension   . Hyperthyroidism    hx  . Hypothyroidism   . Lateral knee pain, left 08/06/2013  . Migraine headache    "a  few times/yr" (03/05/2015)  . Neck strain 02/06/2012  . Osteomyelitis ([place])    [name] 03/05/2015  . Osteomyelitis of toe of left foot ([place]) 03/05/2015  . Palpitations 01/05/2016  . Positive TB test   . Stroke ([place])   . Tubular adenoma of colon   . Tubular adenoma of colon    Vitals:   05/16/18 0950  BP: (!) 146/90  Pulse: 71  Temp: 98.3 F (36.8 C)  TempSrc: Oral  SpO2: 97%  Weight: 211 lb (95.7 kg)  Height: 5\' 5"  (1.651 m)     Review of Systems  Respiratory: Negative.   Cardiovascular: Negative.   Gastrointestinal: Negative.   Musculoskeletal: Positive for arthralgias. Negative for joint  swelling.  Neurological: Negative.   All other systems reviewed and are negative.      Objective:   Physical Exam  Constitutional: She is oriented to person, place, and time. She appears well-developed. No distress.  Cardiovascular: Normal rate, regular rhythm and normal heart sounds.  No murmur heard. Pulmonary/Chest: Effort normal and breath sounds normal. No stridor. No respiratory distress. She has no wheezes.  Abdominal: Soft. Bowel sounds are normal. She exhibits no mass. There is no tenderness. There is no guarding.  Musculoskeletal: She exhibits tenderness. She exhibits no deformity.       Right shoulder: Normal.       Left shoulder: She exhibits decreased range of motion and tenderness. She exhibits no swelling, no effusion and no crepitus.  Mild ankle puffiness B/L  Neurological: She is alert and oriented to person, place, and time.  Nursing note and vitals reviewed.      Assessment:     Edema Weight gain Shoulder pain Hand numbness HTN Hypothyrodism    Plan:     Check problem list.

## 2018-05-16 NOTE — Assessment & Plan Note (Signed)
Currently asymptomatic. Dermatomes non-specific. Referral to Neuro for EMG/NSC

## 2018-05-16 NOTE — Patient Instructions (Signed)
It was nice seeing you today. I am sorry you don't feel well. You have indeed gained some extra weight since the last time I saw you.  I will go ahead and start you on Lasix 20 mg qd. Cut Coreg into half daily. Continue Aldactone as instructed. We will recheck labs today including you thyroid. Please see your endocrinologist and Cardiologist soon. Call if you have any question.

## 2018-05-16 NOTE — Progress Notes (Deleted)
05/16/2018 Teresa [name] 1961-02-17 [id number]   HPI:  Teresa [name] is a 57 y.o. female patient of Dr Oval Linsey, with a [place] below who presents today for hypertension clinic evaluation.  In addition to hypertension, her medical history is significant for prior embolic stroke, hyperlipidemia, migraine headaches, DM2, GERD, and hypothyroidism.  Blood Pressure Goal:  130/80  Current Medications:  Family Hx:  Social Hx:  Diet:  Exercise:  Home BP readings:  Intolerances:   Labs:  Wt Readings from Last 3 Encounters:  05/16/18 211 lb (95.7 kg)  04/20/18 207 lb (93.9 kg)  04/13/18 208 lb (94.3 kg)   BP Readings from Last 3 Encounters:  05/16/18 (!) 146/90  04/20/18 (!) 157/70  04/13/18 (!) 194/72   Pulse Readings from Last 3 Encounters:  05/16/18 71  04/20/18 74  04/13/18 81    Current Outpatient Medications  Medication Sig Dispense Refill  . albuterol (PROVENTIL HFA;VENTOLIN HFA) 108 (90 Base) MCG/ACT inhaler Inhale 2 puffs into the lungs every 6 (six) hours as needed for wheezing or shortness of breath. (Patient not taking: Reported on 05/16/2018) 1 Inhaler 2  . aspirin 81 MG chewable tablet Chew 1 tablet (81 mg total) by mouth daily. 30 tablet 0  . butalbital-acetaminophen-caffeine (FIORICET, ESGIC) 50-325-40 MG tablet Take 1 tablet by mouth every 6 (six) hours as needed for headache or migraine. (Patient not taking: Reported on 04/20/2018) 20 tablet 0  . carvedilol (COREG) 25 MG tablet Take 1 tablet (25 mg total) by mouth 2 (two) times daily with a meal. 180 tablet 3  . cycloSPORINE (RESTASIS) 0.05 % ophthalmic emulsion Place 1 drop into both eyes 2 (two) times daily.     . Evolocumab (REPATHA SURECLICK) 428 MG/ML SOAJ Inject 140 mg into the skin every 14 (fourteen) days. 2 pen 12  . furosemide (LASIX) 20 MG tablet Take 1 tablet (20 mg total) by mouth daily. 30 tablet 1  . hydrALAZINE (APRESOLINE) 50 MG tablet Take 1 tablet (50 mg total) by mouth 3 (three) times  daily. 270 tablet 3  . levothyroxine (SYNTHROID, LEVOTHROID) 125 MCG tablet Take 1 tablet (125 mcg total) by mouth daily. 30 tablet 2  . metFORMIN (GLUCOPHAGE) 500 MG tablet Take 1 tablet (500 mg total) by mouth daily with breakfast. 90 tablet 1  . Multiple Vitamins-Minerals (CENTRUM MULTIGUMMIES) CHEW Chew 2 tablets by mouth daily.    Marland Kitchen spironolactone (ALDACTONE) 50 MG tablet Take 1 tablet (50 mg total) by mouth daily. 90 tablet 1   Current Facility-Administered Medications  Medication Dose Route Frequency Provider Last Rate Last Dose  . olopatadine (PATANOL) 0.1 % ophthalmic solution 1 drop  1 drop Both Eyes BID McDiarmid, Blane Ohara, MD        Allergies  Allergen Reactions  . Bactrim [Sulfamethoxazole-Trimethoprim] Anaphylaxis  . Clonidine Derivatives Swelling and Other (See Comments)    Facial swelling  . Amlodipine Swelling    Leg swelling  . Lisinopril Swelling    Lip swelling  . Statins Other (See Comments)    Body and muscle aches on Statin (Lipitor, Pravachol)  . Aspirin Nausea Only  . Latex Rash    Past Medical History:  Diagnosis Date  . Allergy   . Anemia   . Cervical disc disorder with radiculopathy of cervical region 02/10/2016  . Chronic lower back pain   . Diverticulosis   . EKG, abnormal 01/06/2017  . Gastritis   . GERD (gastroesophageal reflux disease)   . H/O hiatal hernia   .  Hiatal hernia   . History of amputation of lesser toe of left foot ([place]) 03/11/2015  . History of colon cancer   . Hypertension   . Hyperthyroidism    hx  . Hypothyroidism   . Lateral knee pain, left 08/06/2013  . Migraine headache    "a few times/yr" (03/05/2015)  . Neck strain 02/06/2012  . Osteomyelitis ([place])    [name] 03/05/2015  . Osteomyelitis of toe of left foot ([place]) 03/05/2015  . Palpitations 01/05/2016  . Positive TB test   . Stroke ([place])   . Tubular adenoma of colon   . Tubular adenoma of colon     There were no vitals taken for this visit.  No problem-specific  Assessment & Plan notes found for this encounter.   Tommy Medal PharmD CPP [facility] [address] [place], [place] [address] [phone]

## 2018-05-16 NOTE — Assessment & Plan Note (Signed)
Gained 4 lbs since last visit despite effort made to lose weight. ?? Retaining fluid vs fat. Continue diet and exercise plan. Consider nutrition referral. I have discussed this in the past with her.

## 2018-05-16 NOTE — Assessment & Plan Note (Signed)
Trace non-pitting ankle edema. She has gained 4lbs since last visit. Unclear if this is fluid or fat. She wanted to started water pills. Trial of Lasix 20 mg qd. Monitor BP closely at home. Bmet checked to evaluate baseline kidney function today. We will recheck in about 4 weeks depending on result.

## 2018-05-17 ENCOUNTER — Ambulatory Visit: Payer: BLUE CROSS/BLUE SHIELD

## 2018-05-17 LAB — BASIC METABOLIC PANEL
BUN/Creatinine Ratio: 16 (ref 9–23)
BUN: 16 mg/dL (ref 6–24)
CO2: 21 mmol/L (ref 20–29)
Calcium: 9.9 mg/dL (ref 8.7–10.2)
Chloride: 103 mmol/L (ref 96–106)
Creatinine, Ser: 1.02 mg/dL — ABNORMAL HIGH (ref 0.57–1.00)
GFR calc Af Amer: 71 mL/min/{1.73_m2} (ref >59)
GFR calc non Af Amer: 62 mL/min/{1.73_m2} (ref >59)
Glucose: 106 mg/dL — ABNORMAL HIGH (ref 65–99)
Potassium: 4.6 mmol/L (ref 3.5–5.2)
Sodium: 140 mmol/L (ref 134–144)

## 2018-05-17 LAB — TSH: TSH: 1.58 u[IU]/mL (ref 0.450–4.500)

## 2018-05-21 ENCOUNTER — Ambulatory Visit (HOSPITAL_COMMUNITY)
Admission: RE | Admit: 2018-05-21 | Discharge: 2018-05-21 | Disposition: A | Discharge status: Home / Self Care | Payer: BLUE CROSS/BLUE SHIELD | Source: Ambulatory Visit | Attending: Family Medicine | Admitting: Family Medicine

## 2018-05-21 DIAGNOSIS — M25512 Pain in left shoulder: Secondary | ICD-10-CM | POA: Insufficient documentation

## 2018-05-21 DIAGNOSIS — G8929 Other chronic pain: Secondary | ICD-10-CM | POA: Diagnosis present

## 2018-05-28 ENCOUNTER — Other Ambulatory Visit: Payer: BLUE CROSS/BLUE SHIELD

## 2018-05-30 ENCOUNTER — Other Ambulatory Visit (INDEPENDENT_AMBULATORY_CARE_PROVIDER_SITE_OTHER): Payer: BLUE CROSS/BLUE SHIELD

## 2018-05-30 DIAGNOSIS — E890 Postprocedural hypothyroidism: Secondary | ICD-10-CM | POA: Diagnosis not present

## 2018-05-30 LAB — TSH: TSH: 5.77 u[IU]/mL — ABNORMAL HIGH (ref 0.35–4.50)

## 2018-05-30 LAB — T4, FREE: Free T4: 0.76 ng/dL (ref 0.60–1.60)

## 2018-06-05 ENCOUNTER — Encounter: Payer: Self-pay | Admitting: Endocrinology

## 2018-06-05 ENCOUNTER — Ambulatory Visit (INDEPENDENT_AMBULATORY_CARE_PROVIDER_SITE_OTHER): Payer: BLUE CROSS/BLUE SHIELD | Admitting: Endocrinology

## 2018-06-05 VITALS — BP 132/86 | HR 80 | Ht 65.0 in | Wt 208.0 lb

## 2018-06-05 DIAGNOSIS — R232 Flushing: Secondary | ICD-10-CM

## 2018-06-05 DIAGNOSIS — E890 Postprocedural hypothyroidism: Secondary | ICD-10-CM | POA: Diagnosis not present

## 2018-06-05 NOTE — Progress Notes (Signed)
Patient ID: Teresa [name], female   DOB: March 06, 1961, 57 y.o.   MRN: [id number]           Referring Provider: Dr. Gwendlyn Deutscher  Reason for Appointment:  Hypothyroidism     History of Present Illness:   Hypothyroidism was first diagnosed in 2005  At the time of diagnosis patient had radioactive iodine treatment for hyperthyroidism.  Although she had prominence of her eyes her treatment diagnosis for the I-131 treatment with uptake of about 35% She was treated with 24.9 mCi  Her records indicate that she has been on the same dose of 112 mcg since 2017 when she was taking 100 mcg Her main complaint is again weight gain Her TSH was increased on her initial consultation in 10/19  With this her dosage was increased up to 125 mcg She is still having some fatigue but not consistently Her main complaint is also related to tendency to swelling all over being addressed by her PCP  She is having more hot flashes  The patient takes the thyroid supplement consistently about an hour before breakfast  Although her TSH from outside lab was normal last month it is now high again at 5.8         Patient's weight history is as follows:   Wt Readings from Last 3 Encounters:  06/05/18 208 lb (94.3 kg)  05/16/18 211 lb (95.7 kg)  04/20/18 207 lb (93.9 kg)    Thyroid function results have been as follows:  Lab Results  Component Value Date   TSH 5.77 (H) 05/30/2018   TSH 1.580 05/16/2018   TSH 6.26 (H) 04/09/2018   TSH 2.407 09/15/2017   FREET4 0.76 05/30/2018   FREET4 0.74 04/09/2018   FREET4 1.18 (H) 09/15/2017   FREET4 1.65 11/04/2016   T3FREE 3.8 04/09/2018   T3FREE 3.2 11/04/2016     Past Medical History:  Diagnosis Date  . Allergy   . Anemia   . Cervical disc disorder with radiculopathy of cervical region 02/10/2016  . Chronic lower back pain   . Diverticulosis   . EKG, abnormal 01/06/2017  . Gastritis   . GERD (gastroesophageal reflux disease)   . H/O hiatal hernia   . Hiatal  hernia   . History of amputation of lesser toe of left foot ([place]) 03/11/2015  . History of colon cancer   . Hypertension   . Hyperthyroidism    hx  . Hypothyroidism   . Lateral knee pain, left 08/06/2013  . Migraine headache    "a few times/yr" (03/05/2015)  . Neck strain 02/06/2012  . Osteomyelitis ([place])    [name] 03/05/2015  . Osteomyelitis of toe of left foot ([place]) 03/05/2015  . Palpitations 01/05/2016  . Positive TB test   . Stroke ([place])   . Tubular adenoma of colon   . Tubular adenoma of colon     Past Surgical History:  Procedure Laterality Date  . ABDOMINAL HYSTERECTOMY  2006  . AMPUTATION TOE Left 03/05/2015   Procedure: AMPUTATION LEFT 5TH  TOE;  Surgeon: Wylene Simmer, MD;  Location: [facility];  Service: Orthopedics;  Laterality: Left;  . APPENDECTOMY  2006  . BREAST BIOPSY Left 11/26/2014  . [place]  . COLONOSCOPY    . GALLBLADDER SURGERY    . LOOP RECORDER INSERTION N/A 09/18/2017   Procedure: LOOP RECORDER INSERTION;  Surgeon: Evans Lance, MD;  Location: [facility];  Service: Cardiovascular;  Laterality: N/A;  . TEE WITHOUT CARDIOVERSION  N/A 09/18/2017   Procedure: TRANSESOPHAGEAL ECHOCARDIOGRAM (TEE);  Surgeon: Pixie Casino, MD;  [facility];  Service: Cardiovascular;  Laterality: N/A;  . TOE AMPUTATION Left 03/05/2015   5th toe  . TUBAL LIGATION Bilateral 1990  . UPPER GASTROINTESTINAL ENDOSCOPY      Family History  Problem Relation Age of Onset  . Heart disease Mother        CAB age 85  . Diabetes Mother   . Cancer Father 17       pancreatic cancer  . Stroke Father   . Diabetes Sister   . Kidney disease Sister        renal failure  . Diabetes Brother   . Kidney disease Brother        renal failure  . Heart disease Other   . Diabetes Other   . Breast cancer Maternal Aunt        per pt aunts and uncles died of cancer not sure the type  . Breast cancer Paternal Aunt   . Esophageal cancer Neg Hx   . Rectal cancer  Neg Hx   . Stomach cancer Neg Hx   . Thyroid disease Neg Hx     Social History:  reports that she quit smoking about 8 months ago. Her smoking use included cigarettes. She has a 18.00 pack-year smoking history. She has never used smokeless tobacco. She reports that she does not drink alcohol or use drugs.  Allergies:  Allergies  Allergen Reactions  . Bactrim [Sulfamethoxazole-Trimethoprim] Anaphylaxis  . Clonidine Derivatives Swelling and Other (See Comments)    Facial swelling  . Amlodipine Swelling    Leg swelling  . Lisinopril Swelling    Lip swelling  . Statins Other (See Comments)    Body and muscle aches on Statin (Lipitor, Pravachol)  . Aspirin Nausea Only  . Latex Rash    Allergies as of 06/05/2018      Reactions   Bactrim [sulfamethoxazole-trimethoprim] Anaphylaxis   Clonidine Derivatives Swelling, Other (See Comments)   Facial swelling   Amlodipine Swelling   Leg swelling   Lisinopril Swelling   Lip swelling   Statins Other (See Comments)   Body and muscle aches on Statin (Lipitor, Pravachol)   Aspirin Nausea Only   Latex Rash      Medication List        Accurate as of 06/05/18  3:15 PM. Always use your most recent med list.          albuterol 108 (90 Base) MCG/ACT inhaler Commonly known as:  PROVENTIL HFA;VENTOLIN HFA Inhale 2 puffs into the lungs every 6 (six) hours as needed for wheezing or shortness of breath.   aspirin 81 MG chewable tablet Chew 1 tablet (81 mg total) by mouth daily.   butalbital-acetaminophen-caffeine 50-325-40 MG tablet Commonly known as:  FIORICET, ESGIC Take 1 tablet by mouth every 6 (six) hours as needed for headache or migraine.   carvedilol 25 MG tablet Commonly known as:  COREG Take 1 tablet (25 mg total) by mouth 2 (two) times daily with a meal.   CENTRUM MULTIGUMMIES Chew Chew 2 tablets by mouth daily.   cycloSPORINE 0.05 % ophthalmic emulsion Commonly known as:  RESTASIS Place 1 drop into both eyes 2 (two)  times daily.   Evolocumab 140 MG/ML Soaj Inject 140 mg into the skin every 14 (fourteen) days.   furosemide 20 MG tablet Commonly known as:  LASIX Take 1 tablet (20 mg total) by mouth daily.  hydrALAZINE 50 MG tablet Commonly known as:  APRESOLINE Take 1 tablet (50 mg total) by mouth 3 (three) times daily.   levothyroxine 125 MCG tablet Commonly known as:  SYNTHROID, LEVOTHROID Take 1 tablet (125 mcg total) by mouth daily.   metFORMIN 500 MG tablet Commonly known as:  GLUCOPHAGE Take 1 tablet (500 mg total) by mouth daily with breakfast.   spironolactone 50 MG tablet Commonly known as:  ALDACTONE Take 1 tablet (50 mg total) by mouth daily.         Review of Systems   HYPERTENSION: Long-standing and managed by PCP and cardiologist She reported side effects from carvedilol and has not taken this, is taking hydralazine, Aldactone at a higher dose, Lasix She said that she had significant facial swelling with trial of clonidine in the past  BP Readings from Last 3 Encounters:  06/05/18 132/86  05/16/18 (!) 146/90  04/20/18 (!) 157/70               Examination:    BP 132/86 (BP Location: Left Arm, Patient Position: Sitting, Cuff Size: Normal)   Pulse 80   Ht 5\' 5"  (1.651 m)   Wt 208 lb (94.3 kg)   SpO2 97%   BMI 34.61 kg/m   Exam not indicated   Assessment:  HYPOTHYROIDISM, post ablative since 2007  She appears to have progressive increase in the requirement for thyroid supplement She has only gained a few pounds since the last few months Difficult to identify her symptoms because of numerous other problems Unlikely that her weight gain is related to hypothyroidism which is mild  Since TSH is again high at 5.8 she will have  Hypertension: She will continue to follow-up with PCP, also she will discuss her hot flashes with PCP  PLAN:  Levothyroxine 137 mcg daily She can use of her current supply by taking an extra half a pill a week and call for new  prescription when needed  Follow-up 2 months  Patient Instructions  Take take extra 1/2 pill weekly of 125 dose    Natalyn Szymanowski 06/05/2018, 3:15 PM     Note: This office note was prepared with Dragon voice recognition system technology. Any transcriptional errors that result from this process are unintentional.

## 2018-06-05 NOTE — Patient Instructions (Addendum)
Take take extra 1/2 pill weekly of 125 dose

## 2018-06-06 ENCOUNTER — Ambulatory Visit (INDEPENDENT_AMBULATORY_CARE_PROVIDER_SITE_OTHER): Payer: BLUE CROSS/BLUE SHIELD

## 2018-06-06 DIAGNOSIS — I639 Cerebral infarction, unspecified: Secondary | ICD-10-CM

## 2018-06-08 ENCOUNTER — Encounter: Payer: Self-pay | Admitting: Family Medicine

## 2018-06-08 NOTE — Progress Notes (Signed)
[facility] Summary Report / Loop Recorder 

## 2018-06-12 ENCOUNTER — Ambulatory Visit (INDEPENDENT_AMBULATORY_CARE_PROVIDER_SITE_OTHER): Payer: BLUE CROSS/BLUE SHIELD | Admitting: Neurology

## 2018-06-12 ENCOUNTER — Encounter: Payer: Self-pay | Admitting: Neurology

## 2018-06-12 DIAGNOSIS — G5621 Lesion of ulnar nerve, right upper limb: Secondary | ICD-10-CM

## 2018-06-12 HISTORY — DX: Lesion of ulnar nerve, right upper limb: G56.21

## 2018-06-12 NOTE — Progress Notes (Signed)
Please refer to EMG and nerve conduction procedure note.  

## 2018-06-12 NOTE — Procedures (Signed)
     HISTORY:  Teresa [name] is a 57 year old patient with a history of neck pain and right greater than left shoulder discomfort.  The patient has some discomfort down the right arm and numbness in the right fifth finger.  The patient has a lot of spasm and headaches come up in the back of the head.  The patient is being evaluated for a possible neuropathy or a cervical radiculopathy.  NERVE CONDUCTION STUDIES:  Nerve conduction studies were performed on the right upper extremity.  The distal motor latencies and motor amplitudes for the right median and ulnar nerves were normal.  The nerve conduction velocity for the right median nerve was normal.  The nerve conduction velocities for the right ulnar nerve was notable for slowing with stimulation above the elbow, normal below the elbow.  The sensory latencies for the right median and ulnar nerves were normal.  The F-wave latency for the right ulnar nerve was normal.  EMG STUDIES:  EMG study was performed on the right upper extremity:  The first dorsal interosseous muscle reveals 2 to 5 K units with full recruitment. No fibrillations or positive waves were noted. The abductor pollicis brevis muscle reveals 2 to 4 K units with full recruitment. No fibrillations or positive waves were noted. The extensor indicis proprius muscle reveals 1 to 3 K units with full recruitment. No fibrillations or positive waves were noted. The flexor digitorum profundus muscle (III-IV) reveals 2 to 5 K units with full recruitment.  No fibrillations or positive waves were noted. The pronator teres muscle reveals 2 to 3 K units with full recruitment. No fibrillations or positive waves were noted. The biceps muscle reveals 1 to 2 K units with full recruitment. No fibrillations or positive waves were noted. The triceps muscle reveals 2 to 4 K units with full recruitment. No fibrillations or positive waves were noted. The anterior deltoid muscle reveals 2 to 3 K units with  full recruitment. No fibrillations or positive waves were noted. The cervical paraspinal muscles were tested at 2 levels. No abnormalities of insertional activity were seen at either level tested. There was good relaxation.   IMPRESSION:  Nerve conduction studies done on the right upper extremity show slowing across the elbow for the right ulnar nerve.  EMG evaluation of the right upper extremity does show some very mild chronic changes in the ulnar nerve innervated muscles.  There is no evidence of an overlying cervical radiculopathy, but there is evidence of a mild chronic ulnar neuropathy at the right elbow.  Jill Alexanders MD 06/12/2018 9:26 AM  [facility] [address] [employer] [place], [place] [address]  Phone [phone] Fax [phone]

## 2018-06-12 NOTE — Progress Notes (Signed)
[place]    Nerve / Sites Muscle Latency Ref. Amplitude Ref. Rel Amp Segments Distance Velocity Ref. Area    ms ms mV mV %  cm m/s m/s mVms  R Median - APB     Wrist APB 2.6 ?4.4 16.4 ?4.0 100 Wrist - APB 7   52.5     Upper arm APB 6.7  15.7  96.1 Upper arm - Wrist 24 58 ?49 47.0  R Ulnar - ADM     Wrist ADM 1.7 ?3.3 8.3 ?6.0 100 Wrist - ADM 7   26.1     B.Elbow ADM 5.0  7.6  91.6 B.Elbow - Wrist 19 58 ?49 24.9     A.Elbow ADM 7.5  7.0  91.8 A.Elbow - B.Elbow 10 40 ?49 24.2         A.Elbow - Wrist             SNC    Nerve / Sites Rec. Site Peak Lat Ref.  Amp Ref. Segments Distance    ms ms V V  cm  R Median - Orthodromic (Dig II, Mid palm)     Dig II Wrist 3.0 ?3.4 11 ?10 Dig II - Wrist 13  R Ulnar - Orthodromic, (Dig V, Mid palm)     Dig V Wrist 2.4 ?3.1 10 ?5 Dig V - Wrist 55         F  Wave    Nerve F Lat Ref.   ms ms  R Ulnar - ADM 29.1 ?32.0

## 2018-06-23 LAB — CUP PACEART REMOTE DEVICE CHECK
Date Time Interrogation Session: 20191109003959
Implantable Pulse Generator Implant Date: 20190325

## 2018-06-25 ENCOUNTER — Other Ambulatory Visit: Payer: Self-pay

## 2018-06-25 ENCOUNTER — Telehealth: Payer: Self-pay | Admitting: Endocrinology

## 2018-06-25 ENCOUNTER — Encounter (HOSPITAL_COMMUNITY): Payer: Self-pay | Admitting: Emergency Medicine

## 2018-06-25 ENCOUNTER — Ambulatory Visit (HOSPITAL_COMMUNITY)
Admission: EM | Admit: 2018-06-25 | Discharge: 2018-06-25 | Disposition: A | Discharge status: Home / Self Care | Payer: BLUE CROSS/BLUE SHIELD | Attending: Nurse Practitioner | Admitting: Nurse Practitioner

## 2018-06-25 DIAGNOSIS — J0110 Acute frontal sinusitis, unspecified: Secondary | ICD-10-CM

## 2018-06-25 DIAGNOSIS — I10 Essential (primary) hypertension: Secondary | ICD-10-CM | POA: Diagnosis not present

## 2018-06-25 DIAGNOSIS — J029 Acute pharyngitis, unspecified: Secondary | ICD-10-CM | POA: Diagnosis not present

## 2018-06-25 DIAGNOSIS — R05 Cough: Secondary | ICD-10-CM | POA: Diagnosis present

## 2018-06-25 LAB — POCT RAPID STREP A: Streptococcus, Group A Screen (Direct): NEGATIVE

## 2018-06-25 MED ORDER — LEVOTHYROXINE SODIUM 125 MCG PO TABS: 125.0000 ug | ORAL_TABLET | Freq: Every day | ORAL | 4 refills | Status: DC

## 2018-06-25 MED ORDER — ACETAMINOPHEN 325 MG PO TABS
975.0000 mg | ORAL_TABLET | Freq: Once | ORAL | Status: AC
  Administered 2018-06-25: 975 mg via ORAL

## 2018-06-25 MED ORDER — CEFDINIR 300 MG PO CAPS: 300.0000 mg | ORAL_CAPSULE | Freq: Two times a day (BID) | ORAL | 0 refills | Status: AC

## 2018-06-25 MED ORDER — METHYLPREDNISOLONE 4 MG PO TBPK: ORAL_TABLET | ORAL | 0 refills | Status: DC

## 2018-06-25 MED ORDER — FLUTICASONE PROPIONATE 50 MCG/ACT NA SUSP: 1.0000 | Freq: Every day | NASAL | 0 refills | Status: DC

## 2018-06-25 MED ORDER — ACETAMINOPHEN 325 MG PO TABS
ORAL_TABLET | ORAL | Status: AC
  Filled 2018-06-25: qty 3

## 2018-06-25 NOTE — ED Provider Notes (Signed)
[place]    CSN: [id number] Arrival date & time: 06/25/18  1021     History   Chief Complaint Chief Complaint  Patient presents with  . URI    HPI Teresa [name] is a 57 y.o. female.   Subjective:   Teresa [name] is a 57 y.o. female who presents for evaluation of symptoms of a URI. Symptoms include headache, nasal blockage, sinus and nasal congestion and sore throat. Onset of symptoms was 4 days ago and has been gradually worsening since that time. Associated symptoms include non productive cough.  She is drinking plenty of fluids. She has contacts with similar symptoms.  Evaluation to date: none. Treatment to date: none  The following portions of the patient's history were reviewed and updated as appropriate: allergies, current medications, past family history, past medical history, past social history, past surgical history and problem list.       Past Medical History:  Diagnosis Date  . Allergy   . Anemia   . Cervical disc disorder with radiculopathy of cervical region 02/10/2016  . Chronic lower back pain   . Diverticulosis   . EKG, abnormal 01/06/2017  . Gastritis   . GERD (gastroesophageal reflux disease)   . H/O hiatal hernia   . Hiatal hernia   . History of amputation of lesser toe of left foot ([place]) 03/11/2015  . History of colon cancer   . Hypertension   . Hyperthyroidism    hx  . Hypothyroidism   . Lateral knee pain, left 08/06/2013  . Migraine headache    "a few times/yr" (03/05/2015)  . Neck strain 02/06/2012  . Osteomyelitis ([place])    [name] 03/05/2015  . Osteomyelitis of toe of left foot ([place]) 03/05/2015  . Palpitations 01/05/2016  . Positive TB test   . Stroke ([place])   . Tubular adenoma of colon   . Tubular adenoma of colon   . Ulnar neuropathy at elbow of right upper extremity 06/12/2018    Patient Active Problem List   Diagnosis Date Noted  . Ulnar neuropathy at elbow of right upper extremity 06/12/2018  . Numbness and tingling in  right hand 05/16/2018  . Shortness of breath 04/20/2018  . Stye 04/20/2018  . Multiple joint pain 04/20/2018  . Migraine 01/12/2018  . Ear pain, left 12/13/2017  . Edema 11/07/2017  . Hyperlipidemia   . Cerebrovascular accident (CVA) due to embolism of cerebral artery ([place]) 09/15/2017  . History of cholecystectomy 06/27/2017  . Small intestinal bacterial overgrowth 03/23/2017  . Paresthesia 01/06/2017  . Prediabetes 10/29/2015  . Tendinopathy of left rotator cuff 10/27/2015  . Left shoulder pain 05/28/2015  . Essential hypertension   . Right shoulder pain 04/15/2014  . Obesity 04/15/2014  . Cervicalgia 02/11/2014  . Allergic rhinoconjunctivitis 07/19/2013  . Thyroid nodule 02/12/2013  . GERD (gastroesophageal reflux disease) 02/06/2012  . Tobacco abuse 09/21/2011  . Hypothyroidism 09/21/2011  . Throat pain 09/21/2011    Past Surgical History:  Procedure Laterality Date  . ABDOMINAL HYSTERECTOMY  2006  . AMPUTATION TOE Left 03/05/2015   Procedure: AMPUTATION LEFT 5TH  TOE;  Surgeon: Wylene Simmer, MD;  Location: [facility];  Service: Orthopedics;  Laterality: Left;  . APPENDECTOMY  2006  . BREAST BIOPSY Left 11/26/2014  . [place]  . COLONOSCOPY    . GALLBLADDER SURGERY    . LOOP RECORDER INSERTION N/A 09/18/2017   Procedure: LOOP RECORDER INSERTION;  Surgeon: Evans Lance, MD;  Location: [facility]  INVASIVE CV LAB;  Service: Cardiovascular;  Laterality: N/A;  . TEE WITHOUT CARDIOVERSION N/A 09/18/2017   Procedure: TRANSESOPHAGEAL ECHOCARDIOGRAM (TEE);  Surgeon: Pixie Casino, MD;  Location: [facility] ENDOSCOPY;  Service: Cardiovascular;  Laterality: N/A;  . TOE AMPUTATION Left 03/05/2015   5th toe  . TUBAL LIGATION Bilateral 1990  . UPPER GASTROINTESTINAL ENDOSCOPY      OB History   No obstetric history on file.      Home Medications    Prior to Admission medications   Medication Sig Start Date End Date Taking? Authorizing Provider  albuterol (PROVENTIL  HFA;VENTOLIN HFA) 108 (90 Base) MCG/ACT inhaler Inhale 2 puffs into the lungs every 6 (six) hours as needed for wheezing or shortness of breath. 04/20/18   Kinnie Feil, MD  aspirin 81 MG chewable tablet Chew 1 tablet (81 mg total) by mouth daily. 09/19/17   Sherene Sires, DO  butalbital-acetaminophen-caffeine (FIORICET, ESGIC) 50-325-40 MG tablet Take 1 tablet by mouth every 6 (six) hours as needed for headache or migraine. 01/12/18 01/12/19  Kinnie Feil, MD  carvedilol (COREG) 25 MG tablet Take 1 tablet (25 mg total) by mouth 2 (two) times daily with a meal. 04/13/18   Skeet Latch, MD  cefdinir (OMNICEF) 300 MG capsule Take 1 capsule (300 mg total) by mouth 2 (two) times daily for 7 days. 06/25/18 07/02/18  Enrique Sack, FNP  cycloSPORINE (RESTASIS) 0.05 % ophthalmic emulsion Place 1 drop into both eyes 2 (two) times daily.     [provider]  Evolocumab (REPATHA SURECLICK) 427 MG/ML SOAJ Inject 140 mg into the skin every 14 (fourteen) days. 04/24/18   Skeet Latch, MD  fluticasone [facility]) 50 MCG/ACT nasal spray Place 1 spray into both nostrils daily. 06/25/18   Enrique Sack, FNP  furosemide (LASIX) 20 MG tablet Take 1 tablet (20 mg total) by mouth daily. 05/16/18   Kinnie Feil, MD  hydrALAZINE (APRESOLINE) 50 MG tablet Take 1 tablet (50 mg total) by mouth 3 (three) times daily. 04/13/18   Skeet Latch, MD  levothyroxine (SYNTHROID, LEVOTHROID) 125 MCG tablet Take 1 tablet (125 mcg total) by mouth daily. 04/10/18   Elayne Snare, MD  metFORMIN (GLUCOPHAGE) 500 MG tablet Take 1 tablet (500 mg total) by mouth daily with breakfast. 04/20/18   Kinnie Feil, MD  methylPREDNISolone (MEDROL DOSEPAK) 4 MG TBPK tablet Take as directed 06/25/18   Enrique Sack, FNP  Multiple Vitamins-Minerals (CENTRUM MULTIGUMMIES) CHEW Chew 2 tablets by mouth daily.    [provider]  spironolactone (ALDACTONE) 50 MG tablet Take 1 tablet (50 mg total) by mouth  daily. 01/04/18   Kinnie Feil, MD    Family History Family History  Problem Relation Age of Onset  . Heart disease Mother        CAB age 54  . Diabetes Mother   . Cancer Father 44       pancreatic cancer  . Stroke Father   . Diabetes Sister   . Kidney disease Sister        renal failure  . Diabetes Brother   . Kidney disease Brother        renal failure  . Heart disease Other   . Diabetes Other   . Breast cancer Maternal Aunt        per pt aunts and uncles died of cancer not sure the type  . Breast cancer Paternal Aunt   . Esophageal cancer Neg Hx   . Rectal cancer Neg Hx   .  Stomach cancer Neg Hx   . Thyroid disease Neg Hx     Social History Social History   Tobacco Use  . Smoking status: Former Smoker    Packs/day: 0.50    Years: 36.00    Pack years: 18.00    Types: Cigarettes    Last attempt to quit: 09/23/2017    Years since quitting: 0.7  . Smokeless tobacco: Never Used  . Tobacco comment: Tobacco info given 03/01/17  Substance Use Topics  . Alcohol use: No  . Drug use: No     Allergies   Bactrim [sulfamethoxazole-trimethoprim]; Clonidine derivatives; Amlodipine; Lisinopril; Statins; Aspirin; and Latex   Review of Systems Review of Systems  Constitutional: Negative for fever.  HENT: Positive for congestion, rhinorrhea, sinus pressure, sinus pain and sore throat.   Eyes: Negative.   Respiratory: Positive for cough. Negative for shortness of breath and wheezing.   Cardiovascular: Negative.   Gastrointestinal: Negative.   Neurological: Positive for headaches. Negative for dizziness, facial asymmetry, weakness, light-headedness and numbness.  All other systems reviewed and are negative.    Physical Exam Triage Vital Signs ED Triage Vitals  Enc Vitals Group     BP 06/25/18 1223 (!) 202/98     Pulse Rate 06/25/18 1223 84     Resp 06/25/18 1223 18     Temp 06/25/18 1223 98.4 F (36.9 C)     Temp Source 06/25/18 1223 Oral     SpO2 06/25/18  1223 95 %     Weight --      Height --      Head Circumference --      Peak Flow --      Pain Score 06/25/18 1224 5     Pain Loc --      Pain Edu? --      Excl. in [place]? --    No data found.  Updated Vital Signs BP (!) 185/65 (BP Location: Right Arm)   Pulse 72   Temp 98.2 F (36.8 C) (Oral)   Resp 18   SpO2 97%   Visual Acuity Right Eye Distance:   Left Eye Distance:   Bilateral Distance:    Right Eye Near:   Left Eye Near:    Bilateral Near:     Physical Exam Constitutional:      Appearance: Normal appearance.  HENT:     Head: Normocephalic.     Right Ear: Tympanic membrane, ear canal and external ear normal.     Left Ear: Tympanic membrane, ear canal and external ear normal.     Nose:     Right Sinus: Frontal sinus tenderness present. No maxillary sinus tenderness.     Left Sinus: Frontal sinus tenderness present. No maxillary sinus tenderness.     Mouth/Throat:     Mouth: Mucous membranes are moist.     Pharynx: Oropharynx is clear.  Eyes:     Extraocular Movements: Extraocular movements intact.     Conjunctiva/sclera: Conjunctivae normal.     Pupils: Pupils are equal, round, and reactive to light.  Neck:     Musculoskeletal: Normal range of motion and neck supple.  Cardiovascular:     Rate and Rhythm: Normal rate and regular rhythm.  Pulmonary:     Effort: Pulmonary effort is normal.     Breath sounds: Normal breath sounds.  Musculoskeletal: Normal range of motion.  Lymphadenopathy:     Cervical: No cervical adenopathy.  Skin:    General: Skin is warm and dry.  Neurological:     General: No focal deficit present.     Mental Status: She is alert and oriented to person, place, and time.  Psychiatric:        Mood and Affect: Mood normal.        Behavior: Behavior normal.      UC Treatments / Results  Labs (all labs ordered are listed, but only abnormal results are displayed) Labs Reviewed  CULTURE, GROUP A STREP [facility])  POCT RAPID STREP A     EKG None  Radiology No results found.  Procedures Procedures (including critical care time)  Medications Ordered in UC Medications  acetaminophen (TYLENOL) tablet 975 mg (975 mg Oral Given 06/25/18 1314)    Initial Impression / Assessment and Plan / UC Course  I have reviewed the triage vital signs and the nursing notes.  Pertinent labs & imaging results that were available during my care of the patient were reviewed by me and considered in my medical decision making (see chart for details).     57 year old female presenting with a 4-day history of URI.  Patient afebrile.  Non-toxic appearing.  Hypertensive.  History of hypertension. Reports compliance with prescribed antihypertensives. She has a headache. Denies blurred vision, chest pain, dizziness, shortness of breath, limb/facial paraesthesias or palpitations. Given Tylenol and allowed to rest. Repeat BP improved but still elevated. Headache better as well. No focal deficits noted. Strep negative. Will discharge home with close outpatient f/u with PCP. Discussed indications for immediate ED follow-up.     Today's evaluation has revealed no signs of a dangerous process. Discussed diagnosis with patient. Patient aware of their diagnosis, possible red flag symptoms to watch out for and need for close follow up. Patient understands verbal and written discharge instructions. Patient comfortable with plan and disposition.  Patient has a clear mental status at this time, good insight into illness (after discussion and teaching) and has clear judgment to make decisions regarding their care.  Documentation was completed with the aid of voice recognition software. Transcription may contain typographical errors.  Final Clinical Impressions(s) / UC Diagnoses   Final diagnoses:  Acute non-recurrent frontal sinusitis     Discharge Instructions     Take medications as prescribed.  Tylenol and/or ibuprofen as needed.  Plenty of fluids.   Adhere to a low-salt diet.  Monitor blood pressure closely.  Follow-up with your primary care provider    ED Prescriptions    Medication Sig Dispense Auth. Provider   methylPREDNISolone (MEDROL DOSEPAK) 4 MG TBPK tablet Take as directed 21 tablet Enrique Sack, FNP   cefdinir (OMNICEF) 300 MG capsule Take 1 capsule (300 mg total) by mouth 2 (two) times daily for 7 days. 14 capsule Elianah Karis, Aldona Bar, FNP   fluticasone (FLONASE) 50 MCG/ACT nasal spray Place 1 spray into both nostrils daily. 16 g Enrique Sack, FNP     Controlled Substance Prescriptions [place] Controlled Substance Registry consulted? Not Applicable   Enrique Sack, [place] 06/25/18 1335

## 2018-06-25 NOTE — Telephone Encounter (Signed)
rx sent

## 2018-06-25 NOTE — Telephone Encounter (Signed)
MEDICATION: levothyroxine (SYNTHROID, LEVOTHROID) 125 MCG tablet  PHARMACY:  [facility], [place] - [address]  IS THIS A 90 DAY SUPPLY : Patient would like a 90 day supply  IS PATIENT OUT OF MEDICATION: No  IF NOT; HOW MUCH IS LEFT:   LAST APPOINTMENT DATE: @12 /03/2018  NEXT APPOINTMENT DATE:@2 /12/2018  DO WE HAVE YOUR PERMISSION TO LEAVE A DETAILED MESSAGE:  OTHER COMMENTS:    **Let patient know to contact pharmacy at the end of the day to make sure medication is ready. **  ** Please notify patient to allow 48-72 hours to process**  **Encourage patient to contact the pharmacy for refills or they can request refills through [facility]**

## 2018-06-25 NOTE — Discharge Instructions (Signed)
Take medications as prescribed.  Tylenol and/or ibuprofen as needed.  Plenty of fluids.  Adhere to a low-salt diet.  Monitor blood pressure closely.  Follow-up with your primary care provider

## 2018-06-25 NOTE — ED Triage Notes (Signed)
Pt here for URI and sore throat sx

## 2018-06-28 LAB — CULTURE, GROUP A STREP (THRC)

## 2018-07-04 ENCOUNTER — Encounter

## 2018-07-04 ENCOUNTER — Encounter: Payer: BLUE CROSS/BLUE SHIELD | Admitting: Neurology

## 2018-07-09 ENCOUNTER — Ambulatory Visit (INDEPENDENT_AMBULATORY_CARE_PROVIDER_SITE_OTHER): Payer: Self-pay

## 2018-07-09 DIAGNOSIS — I639 Cerebral infarction, unspecified: Secondary | ICD-10-CM

## 2018-07-10 NOTE — Progress Notes (Signed)
[facility] Summary Report / Loop Recorder 

## 2018-07-12 LAB — CUP PACEART REMOTE DEVICE CHECK
Date Time Interrogation Session: 20200114003643
Implantable Pulse Generator Implant Date: 20190325

## 2018-07-15 LAB — CUP PACEART REMOTE DEVICE CHECK
Date Time Interrogation Session: 20191212003731
Implantable Pulse Generator Implant Date: 20190325

## 2018-07-17 ENCOUNTER — Ambulatory Visit: Payer: BLUE CROSS/BLUE SHIELD | Admitting: Cardiovascular Disease

## 2018-07-17 NOTE — Progress Notes (Deleted)
Cardiology Office Note   Date:  07/17/2018   ID:  Teresa [name], DOB [birth year], MRN [id number]  PCP:  Teresa Feil, MD  Cardiologist:   Teresa Latch, MD   No chief complaint on file.    History of Present Illness: Teresa [name] is a 58 y.o. female with  stroke, hypertension, palpitations, moderate LVH, and hyperthyroidism who presents for follow up.  Teresa [name] saw Teresa Mews, MD, on 01/05/16.  At that appointment she reported occasional palpitations and burning in her chest.  She had an echo on 7/13 showed LVEF 55-60% with grade 2 diastolic dysfunction and mild MR.  Teresa [name] reports one year of palpitations and chest pain that occurs sporadically.  She was referred for [facility] 02/24/16 that revealed LVEF 47% and no ischemia.  At her appointment on 01/2016 her blood pressure was elevated so she was switched from metoprolol to carvedilol.  She was subsequently diagnosed with cervicalgia.  Teresa [name] presented 08/2017 with cryptogenic embolic stroke.  Echo at that time revealed LVEF 60 to 65% with grade 1 diastolic dysfunction and moderate aortic regurgitation She had a TEE that was negative for left atrial appendage thrombus and her aortic regurgitation was noted to be mild.  A loop recorder was implanted and no significant arrhythmias have been recorded thus far.   Amlodipine was discontinued due to swelling and chlorthalidone was added.  She was referred to pharmacy to start Unity.    At her last appointment Teresa [name]'s blood pressure was poorly controlled so hydralazine was increased back to 3 times daily and carvedilol was increased.  Past Medical History:  Diagnosis Date  . Allergy   . Anemia   . Cervical disc disorder with radiculopathy of cervical region 02/10/2016  . Chronic lower back pain   . Diverticulosis   . EKG, abnormal 01/06/2017  . Gastritis   . GERD (gastroesophageal reflux disease)   . H/O hiatal hernia   . Hiatal hernia   . History of amputation of  lesser toe of left foot ([place]) 03/11/2015  . History of colon cancer   . Hypertension   . Hyperthyroidism    hx  . Hypothyroidism   . Lateral knee pain, left 08/06/2013  . Migraine headache    "a few times/yr" (03/05/2015)  . Neck strain 02/06/2012  . Osteomyelitis ([place])    Teresa [name] 03/05/2015  . Osteomyelitis of toe of left foot ([name]) 03/05/2015  . Palpitations 01/05/2016  . Positive TB test   . Stroke ([place])   . Tubular adenoma of colon   . Tubular adenoma of colon   . Ulnar neuropathy at elbow of right upper extremity 06/12/2018    Past Surgical History:  Procedure Laterality Date  . ABDOMINAL HYSTERECTOMY  2006  . AMPUTATION TOE Left 03/05/2015   Procedure: AMPUTATION LEFT 5TH  TOE;  Surgeon: Teresa Simmer, MD;  Location: [facility];  Service: Orthopedics;  Laterality: Left;  . APPENDECTOMY  2006  . BREAST BIOPSY Left 11/26/2014  . [place]  . COLONOSCOPY    . GALLBLADDER SURGERY    . LOOP RECORDER INSERTION N/A 09/18/2017   Procedure: LOOP RECORDER INSERTION;  Surgeon: Teresa Lance, MD;  Location: [facility] CV LAB;  Service: Cardiovascular;  Laterality: N/A;  . TEE WITHOUT CARDIOVERSION N/A 09/18/2017   Procedure: TRANSESOPHAGEAL ECHOCARDIOGRAM (TEE);  Surgeon: Teresa Casino, MD;  Location: [facility] ENDOSCOPY;  Service: Cardiovascular;  Laterality: N/A;  . TOE AMPUTATION Left 03/05/2015  5th toe  . TUBAL LIGATION Bilateral 1990  . UPPER GASTROINTESTINAL ENDOSCOPY       Current Outpatient Medications  Medication Sig Dispense Refill  . albuterol (PROVENTIL HFA;VENTOLIN HFA) 108 (90 Base) MCG/ACT inhaler Inhale 2 puffs into the lungs every 6 (six) hours as needed for wheezing or shortness of breath. 1 Inhaler 2  . aspirin 81 MG chewable tablet Chew 1 tablet (81 mg total) by mouth daily. 30 tablet 0  . butalbital-acetaminophen-caffeine (FIORICET, ESGIC) 50-325-40 MG tablet Take 1 tablet by mouth every 6 (six) hours as needed for headache or migraine. 20 tablet 0  .  carvedilol (COREG) 25 MG tablet Take 1 tablet (25 mg total) by mouth 2 (two) times daily with a meal. 180 tablet 3  . cycloSPORINE (RESTASIS) 0.05 % ophthalmic emulsion Place 1 drop into both eyes 2 (two) times daily.     . Evolocumab (REPATHA SURECLICK) 371 MG/ML SOAJ Inject 140 mg into the skin every 14 (fourteen) days. 2 pen 12  . fluticasone (FLONASE) 50 MCG/ACT nasal spray Place 1 spray into both nostrils daily. 16 g 0  . furosemide (LASIX) 20 MG tablet Take 1 tablet (20 mg total) by mouth daily. 30 tablet 1  . hydrALAZINE (APRESOLINE) 50 MG tablet Take 1 tablet (50 mg total) by mouth 3 (three) times daily. 270 tablet 3  . levothyroxine (SYNTHROID, LEVOTHROID) 125 MCG tablet Take 1 tablet (125 mcg total) by mouth daily. 90 tablet 4  . metFORMIN (GLUCOPHAGE) 500 MG tablet Take 1 tablet (500 mg total) by mouth daily with breakfast. 90 tablet 1  . methylPREDNISolone (MEDROL DOSEPAK) 4 MG TBPK tablet Take as directed 21 tablet 0  . Multiple Vitamins-Minerals (CENTRUM MULTIGUMMIES) CHEW Chew 2 tablets by mouth daily.    Marland Kitchen spironolactone (ALDACTONE) 50 MG tablet Take 1 tablet (50 mg total) by mouth daily. 90 tablet 1   Current Facility-Administered Medications  Medication Dose Route Frequency Provider Last Rate Last Dose  . olopatadine (PATANOL) 0.1 % ophthalmic solution 1 drop  1 drop Both Eyes BID McDiarmid, Blane Ohara, MD        Allergies:   Bactrim [sulfamethoxazole-trimethoprim]; Clonidine derivatives; Amlodipine; Lisinopril; Statins; Aspirin; and Latex    Social History:  The patient  reports that she quit smoking about 9 months ago. Her smoking use included cigarettes. She has a 18.00 pack-year smoking history. She has never used smokeless tobacco. She reports that she does not drink alcohol or use drugs.   Family History:  The patient's family history includes Breast cancer in her maternal aunt and paternal aunt; Cancer (age of onset: 32) in her father; Diabetes in her brother, mother,  sister, and another family member; Heart disease in her mother and another family member; Kidney disease in her brother and sister; Stroke in her father.    ROS:  Please see the history of present illness.   Otherwise, review of systems are positive for neck pain.   All other systems are reviewed and negative.    PHYSICAL EXAM: VS:  There were no vitals taken for this visit. , BMI There is no height or weight on file to calculate BMI. GENERAL:  Well appearing HEENT: Pupils equal round and reactive, fundi not visualized, oral mucosa unremarkable NECK:  No jugular venous distention, waveform within normal limits, carotid upstroke brisk and symmetric, no bruits LUNGS:  Clear to auscultation bilaterally HEART:  RRR.  PMI not displaced or sustained,S1 and S2 within normal limits, no S3, no S4, no clicks,  no rubs, no murmurs ABD:  Flat, positive bowel sounds normal in frequency in pitch, no bruits, no rebound, no guarding, no midline pulsatile mass, no hepatomegaly, no splenomegaly EXT:  2 plus pulses throughout, no edema, no cyanosis no clubbing SKIN:  No rashes no nodules NEURO:  Cranial nerves II through XII grossly intact, motor grossly intact throughout PSYCH:  Cognitively intact, oriented to person place and time   EKG:  EKG is not ordered today. The ekg ordered 02/11/16 demonstrates sinus bradycardia.  Rate 59 bpm.  Inferolateral TWI, concerning for inferolateral ischemia.  Unchanged from 03/09/15.  Lexiscan Myoview 02/24/16:  Nuclear stress EF: 47% by computer calculations. Visually , the EF is normal and is around 55-60%.  There was no ST segment deviation noted during stress.  The study is normal.  This is a low risk study.  Echo 09/16/17: Study Conclusions  - Left ventricle: The cavity size was normal. There was moderate   concentric hypertrophy. Systolic function was normal. The   estimated ejection fraction was in the range of 60% to 65%. Prisco   motion was normal; there  were no regional Ezra motion   abnormalities. Doppler parameters are consistent with abnormal   left ventricular relaxation (grade 1 diastolic dysfunction).   Doppler parameters are consistent with elevated ventricular   end-diastolic filling pressure. - Aortic valve: There was moderate regurgitation. - Mitral valve: Calcified annulus. Mildly thickened leaflets .   There was mild regurgitation. - Left atrium: The atrium was mildly dilated. - Right ventricle: Systolic function was normal. - Tricuspid valve: There was mild regurgitation. - Pulmonic valve: There was trivial regurgitation. - Pulmonary arteries: The main pulmonary artery was normal-sized.   Systolic pressure was within the normal range. - Inferior vena cava: The vessel was normal in size. - Pericardium, extracardiac: There was no pericardial effusion.   Recent Labs: 03/11/2018: ALT 32; Hemoglobin 13.8; Platelets 294 05/16/2018: BUN 16; Creatinine, Ser 1.02; Potassium 4.6; Sodium 140 05/30/2018: TSH 5.77    Lipid Panel    Component Value Date/Time   CHOL 149 02/20/2018 1100   TRIG 50 02/20/2018 1100   HDL 46 02/20/2018 1100   CHOLHDL 3.2 02/20/2018 1100   CHOLHDL 4.3 09/15/2017 1217   VLDL 13 09/15/2017 1217   LDLCALC 93 02/20/2018 1100      Wt Readings from Last 3 Encounters:  06/05/18 208 lb (94.3 kg)  05/16/18 211 lb (95.7 kg)  04/20/18 207 lb (93.9 kg)      ASSESSMENT AND PLAN:  # Atypical chest pain: Resolved. Lexiscan Myoview was negative.   # Hypertension: BP remains poorly controlled.  It is unclear why her hydralazine has switched to only once per day.  We will resume 50 mg 3 times daily.  Also increase carvedilol to 25 mg twice a day.  She will check her blood pressures at home and if her blood pressure remains elevated she will increase hydralazine to 100 mg 3 times a day.  # Embolic stroke: Idiopathic.  Loop recorder in place.  Hypertension and lipid management as above.   # Hyperlipidemia: We  will follow-up with pharmacy regarding Repatha.  # Chronic diastolic heart failure: Ms. Kydd appears euvolemic on exam.  BP control as above.    # Palpitations: Stable.  # OSA: Not severe enough for CPAP 03/2016.   Current medicines are reviewed at length with the patient today.  The patient does not have concerns regarding medicines.  The following changes have been made:  Increase carvedilol  and hydralazine.  Labs/ tests ordered today include:   No orders of the defined types were placed in this encounter.    Disposition:   FU with Oden Lindaman C. Oval Linsey, MD, [facility] in 3 months.  Pharm.D. in 1 month.   Signed, Koula Venier C. Oval Linsey, MD, [facility]  07/17/2018 7:50 AM    [place]

## 2018-07-19 ENCOUNTER — Encounter: Payer: Self-pay | Admitting: Cardiovascular Disease

## 2018-07-26 ENCOUNTER — Ambulatory Visit: Payer: BLUE CROSS/BLUE SHIELD | Admitting: Adult Health

## 2018-07-27 ENCOUNTER — Other Ambulatory Visit: Payer: Self-pay | Admitting: Family Medicine

## 2018-07-27 ENCOUNTER — Encounter: Payer: Self-pay | Admitting: Family Medicine

## 2018-07-27 DIAGNOSIS — I10 Essential (primary) hypertension: Secondary | ICD-10-CM

## 2018-07-27 MED ORDER — SPIRONOLACTONE 50 MG PO TABS: 50.0000 mg | ORAL_TABLET | Freq: Every day | ORAL | 1 refills | Status: DC

## 2018-08-03 ENCOUNTER — Other Ambulatory Visit: Payer: BLUE CROSS/BLUE SHIELD

## 2018-08-08 ENCOUNTER — Ambulatory Visit: Payer: BLUE CROSS/BLUE SHIELD | Admitting: Endocrinology

## 2018-08-13 ENCOUNTER — Ambulatory Visit (INDEPENDENT_AMBULATORY_CARE_PROVIDER_SITE_OTHER): Payer: Self-pay

## 2018-08-13 DIAGNOSIS — I639 Cerebral infarction, unspecified: Secondary | ICD-10-CM

## 2018-08-14 LAB — CUP PACEART REMOTE DEVICE CHECK
Date Time Interrogation Session: 20200216003844
Implantable Pulse Generator Implant Date: 20190325

## 2018-08-22 NOTE — Progress Notes (Signed)
[facility] Summary Report / Loop Recorder 

## 2018-08-23 ENCOUNTER — Ambulatory Visit: Payer: Self-pay

## 2018-09-13 ENCOUNTER — Ambulatory Visit (INDEPENDENT_AMBULATORY_CARE_PROVIDER_SITE_OTHER): Payer: Self-pay | Admitting: *Deleted

## 2018-09-13 ENCOUNTER — Other Ambulatory Visit: Payer: Self-pay

## 2018-09-13 DIAGNOSIS — I639 Cerebral infarction, unspecified: Secondary | ICD-10-CM

## 2018-09-14 LAB — CUP PACEART REMOTE DEVICE CHECK
Date Time Interrogation Session: 20200320014008
Implantable Pulse Generator Implant Date: 20190325

## 2018-09-21 NOTE — Progress Notes (Signed)
[facility] Summary Report / Loop Recorder 

## 2018-10-04 ENCOUNTER — Other Ambulatory Visit: Payer: BLUE CROSS/BLUE SHIELD

## 2018-10-08 ENCOUNTER — Ambulatory Visit: Payer: BLUE CROSS/BLUE SHIELD | Admitting: Endocrinology

## 2018-10-16 ENCOUNTER — Ambulatory Visit (INDEPENDENT_AMBULATORY_CARE_PROVIDER_SITE_OTHER): Payer: Self-pay | Admitting: *Deleted

## 2018-10-16 ENCOUNTER — Other Ambulatory Visit: Payer: Self-pay

## 2018-10-16 DIAGNOSIS — I639 Cerebral infarction, unspecified: Secondary | ICD-10-CM

## 2018-10-16 DIAGNOSIS — I5032 Chronic diastolic (congestive) heart failure: Secondary | ICD-10-CM

## 2018-10-17 LAB — CUP PACEART REMOTE DEVICE CHECK
Date Time Interrogation Session: 20200422020520
Implantable Pulse Generator Implant Date: 20190325

## 2018-10-22 ENCOUNTER — Telehealth: Payer: Self-pay | Admitting: Pharmacist

## 2018-10-22 NOTE — Telephone Encounter (Signed)
PA for repatha approved since October/2019. Rx sent to [facility] pharmacy as well. Patient not currently on therapy for unknown reason.   LMOM for patient to call back to talk to pharmacist

## 2018-10-22 NOTE — Telephone Encounter (Signed)
-----   Message from Earvin Hansen, LPN sent at 1/50/4136 10:05 AM EDT ----- Just following up on this  ----- Message ----- From: Earvin Hansen, LPN Sent: [id number]   8:44 AM EDT To: Earvin Hansen, LPN, C[facility] Nl Anticoag  GM,  MS Bergeron WAS SEEN REGARDING LIPIDS AND REPATHA. SHE HAS NOT HEARD ANYTHING ABOUT STATUS OF THIS. CAN YOU PLEASE CALL HER TO DISCUSS THANKS [place]

## 2018-10-23 NOTE — Telephone Encounter (Signed)
Patient lost medical insurance for year 20202.  Will mail [facility] paperwork to try coverage until end of year.

## 2018-10-23 NOTE — Progress Notes (Signed)
[facility] Summary Report / Loop Recorder 

## 2018-10-26 ENCOUNTER — Telehealth: Payer: Self-pay

## 2018-10-26 NOTE — Telephone Encounter (Signed)
Overdue for an appt. Called pt to schedule. Unfortunately, pt no longer can afford her insurance premiums and is now self pay. Requested to reschedule once her financial situation has changed.

## 2018-11-13 ENCOUNTER — Encounter: Payer: Self-pay | Admitting: Family Medicine

## 2018-11-19 LAB — CUP PACEART REMOTE DEVICE CHECK
Date Time Interrogation Session: 20200525020726
Implantable Pulse Generator Implant Date: 20190325

## 2018-11-20 ENCOUNTER — Ambulatory Visit (INDEPENDENT_AMBULATORY_CARE_PROVIDER_SITE_OTHER): Payer: Self-pay | Admitting: *Deleted

## 2018-11-20 DIAGNOSIS — I5032 Chronic diastolic (congestive) heart failure: Secondary | ICD-10-CM

## 2018-11-20 DIAGNOSIS — I639 Cerebral infarction, unspecified: Secondary | ICD-10-CM

## 2018-12-03 NOTE — Progress Notes (Signed)
[facility] Summary Report / Loop Recorder 

## 2018-12-21 ENCOUNTER — Ambulatory Visit (INDEPENDENT_AMBULATORY_CARE_PROVIDER_SITE_OTHER): Payer: Self-pay | Admitting: *Deleted

## 2018-12-21 DIAGNOSIS — I639 Cerebral infarction, unspecified: Secondary | ICD-10-CM

## 2018-12-22 LAB — CUP PACEART REMOTE DEVICE CHECK
Date Time Interrogation Session: 20200627023533
Implantable Pulse Generator Implant Date: 20190325

## 2018-12-27 NOTE — Progress Notes (Signed)
[facility] Summary Report / Loop Recorder 

## 2019-01-23 ENCOUNTER — Ambulatory Visit (INDEPENDENT_AMBULATORY_CARE_PROVIDER_SITE_OTHER): Payer: Self-pay | Admitting: *Deleted

## 2019-01-23 DIAGNOSIS — I6340 Cerebral infarction due to embolism of unspecified cerebral artery: Secondary | ICD-10-CM

## 2019-01-24 LAB — CUP PACEART REMOTE DEVICE CHECK
Date Time Interrogation Session: 20200730073904
Implantable Pulse Generator Implant Date: 20190325

## 2019-02-04 NOTE — Progress Notes (Signed)
[facility] Summary Report / Loop Recorder 

## 2019-02-26 ENCOUNTER — Encounter: Payer: Self-pay | Admitting: *Deleted

## 2019-02-26 LAB — CUP PACEART REMOTE DEVICE CHECK
Date Time Interrogation Session: 20200901121252
Implantable Pulse Generator Implant Date: 20190325

## 2019-03-29 ENCOUNTER — Ambulatory Visit (INDEPENDENT_AMBULATORY_CARE_PROVIDER_SITE_OTHER): Payer: Self-pay | Admitting: *Deleted

## 2019-03-29 DIAGNOSIS — I6340 Cerebral infarction due to embolism of unspecified cerebral artery: Secondary | ICD-10-CM

## 2019-03-31 LAB — CUP PACEART REMOTE DEVICE CHECK
Date Time Interrogation Session: 20201004121624
Implantable Pulse Generator Implant Date: 20190325

## 2019-04-02 NOTE — Progress Notes (Signed)
[facility] Summary Report / Loop Recorder 

## 2019-04-07 ENCOUNTER — Emergency Department (HOSPITAL_COMMUNITY)
Admission: EM | Admit: 2019-04-07 | Discharge: 2019-04-07 | Disposition: A | Discharge status: Home / Self Care | Payer: Self-pay | Attending: Emergency Medicine | Admitting: Emergency Medicine

## 2019-04-07 ENCOUNTER — Encounter (HOSPITAL_COMMUNITY): Payer: Self-pay | Admitting: Emergency Medicine

## 2019-04-07 ENCOUNTER — Other Ambulatory Visit: Payer: Self-pay

## 2019-04-07 ENCOUNTER — Emergency Department (HOSPITAL_COMMUNITY): Payer: Self-pay

## 2019-04-07 DIAGNOSIS — I10 Essential (primary) hypertension: Secondary | ICD-10-CM | POA: Insufficient documentation

## 2019-04-07 DIAGNOSIS — E039 Hypothyroidism, unspecified: Secondary | ICD-10-CM | POA: Insufficient documentation

## 2019-04-07 DIAGNOSIS — Z91040 Latex allergy status: Secondary | ICD-10-CM | POA: Insufficient documentation

## 2019-04-07 DIAGNOSIS — M542 Cervicalgia: Secondary | ICD-10-CM | POA: Insufficient documentation

## 2019-04-07 DIAGNOSIS — Z7982 Long term (current) use of aspirin: Secondary | ICD-10-CM | POA: Insufficient documentation

## 2019-04-07 DIAGNOSIS — Z87891 Personal history of nicotine dependence: Secondary | ICD-10-CM | POA: Insufficient documentation

## 2019-04-07 DIAGNOSIS — M546 Pain in thoracic spine: Secondary | ICD-10-CM | POA: Insufficient documentation

## 2019-04-07 DIAGNOSIS — Z79899 Other long term (current) drug therapy: Secondary | ICD-10-CM | POA: Insufficient documentation

## 2019-04-07 DIAGNOSIS — Z8673 Personal history of transient ischemic attack (TIA), and cerebral infarction without residual deficits: Secondary | ICD-10-CM | POA: Insufficient documentation

## 2019-04-07 LAB — URINALYSIS, ROUTINE W REFLEX MICROSCOPIC
Bilirubin Urine: NEGATIVE
Glucose, UA: NEGATIVE mg/dL
Hgb urine dipstick: NEGATIVE
Ketones, ur: NEGATIVE mg/dL
Leukocytes,Ua: NEGATIVE
Nitrite: NEGATIVE
Protein, ur: NEGATIVE mg/dL
Specific Gravity, Urine: 1.024 (ref 1.005–1.030)
pH: 5 (ref 5.0–8.0)

## 2019-04-07 LAB — COMPREHENSIVE METABOLIC PANEL
ALT: 24 U/L (ref 0–44)
AST: 19 U/L (ref 15–41)
Albumin: 4.1 g/dL (ref 3.5–5.0)
Alkaline Phosphatase: 85 U/L (ref 38–126)
Anion gap: 13 (ref 5–15)
BUN: 16 mg/dL (ref 6–20)
CO2: 22 mmol/L (ref 22–32)
Calcium: 9.9 mg/dL (ref 8.9–10.3)
Chloride: 104 mmol/L (ref 98–111)
Creatinine, Ser: 1 mg/dL (ref 0.44–1.00)
GFR calc Af Amer: >60 mL/min (ref >60)
GFR calc non Af Amer: >60 mL/min (ref >60)
Glucose, Bld: 93 mg/dL (ref 70–99)
Potassium: 3.8 mmol/L (ref 3.5–5.1)
Sodium: 139 mmol/L (ref 135–145)
Total Bilirubin: 0.5 mg/dL (ref 0.3–1.2)
Total Protein: 7.4 g/dL (ref 6.5–8.1)

## 2019-04-07 LAB — DIFFERENTIAL
Abs Immature Granulocytes: 0.02 10*3/uL (ref 0.00–0.07)
Basophils Absolute: 0.1 10*3/uL (ref 0.0–0.1)
Basophils Relative: 1 %
Eosinophils Absolute: 0.2 10*3/uL (ref 0.0–0.5)
Eosinophils Relative: 2 %
Immature Granulocytes: 0 %
Lymphocytes Relative: 36 %
Lymphs Abs: 2.6 10*3/uL (ref 0.7–4.0)
Monocytes Absolute: 0.4 10*3/uL (ref 0.1–1.0)
Monocytes Relative: 6 %
Neutro Abs: 4.1 10*3/uL (ref 1.7–7.7)
Neutrophils Relative %: 55 %

## 2019-04-07 LAB — PROTIME-INR
INR: 1.2 (ref 0.8–1.2)
Prothrombin Time: 14.6 seconds (ref 11.4–15.2)

## 2019-04-07 LAB — CBC
HCT: 45.9 % (ref 36.0–46.0)
Hemoglobin: 14 g/dL (ref 12.0–15.0)
MCH: 25.7 pg — ABNORMAL LOW (ref 26.0–34.0)
MCHC: 30.5 g/dL (ref 30.0–36.0)
MCV: 84.4 fL (ref 80.0–100.0)
Platelets: 312 10*3/uL (ref 150–400)
RBC: 5.44 MIL/uL — ABNORMAL HIGH (ref 3.87–5.11)
RDW: 14 % (ref 11.5–15.5)
WBC: 7.3 10*3/uL (ref 4.0–10.5)
nRBC: 0 % (ref 0.0–0.2)

## 2019-04-07 LAB — I-STAT CHEM 8, ED
BUN: 17 mg/dL (ref 6–20)
Calcium, Ion: 1.2 mmol/L (ref 1.15–1.40)
Chloride: 105 mmol/L (ref 98–111)
Creatinine, Ser: 1 mg/dL (ref 0.44–1.00)
Glucose, Bld: 93 mg/dL (ref 70–99)
HCT: 45 % (ref 36.0–46.0)
Hemoglobin: 15.3 g/dL — ABNORMAL HIGH (ref 12.0–15.0)
Potassium: 3.8 mmol/L (ref 3.5–5.1)
Sodium: 142 mmol/L (ref 135–145)
TCO2: 24 mmol/L (ref 22–32)

## 2019-04-07 LAB — TROPONIN I (HIGH SENSITIVITY)
Troponin I (High Sensitivity): 16 ng/L (ref <18)
Troponin I (High Sensitivity): 21 ng/L — ABNORMAL HIGH (ref <18)

## 2019-04-07 LAB — APTT: aPTT: 35 seconds (ref 24–36)

## 2019-04-07 LAB — CBG MONITORING, ED: Glucose-Capillary: 81 mg/dL (ref 70–99)

## 2019-04-07 LAB — I-STAT BETA HCG BLOOD, ED (MC, WL, AP ONLY): I-stat hCG, quantitative: 5.7 m[IU]/mL — ABNORMAL HIGH (ref <5)

## 2019-04-07 MED ORDER — CYCLOBENZAPRINE HCL 10 MG PO TABS
10.0000 mg | ORAL_TABLET | Freq: Once | ORAL | Status: AC
  Administered 2019-04-07: 10 mg via ORAL
  Filled 2019-04-07: qty 1

## 2019-04-07 MED ORDER — CYCLOBENZAPRINE HCL 10 MG PO TABS: 10.0000 mg | ORAL_TABLET | Freq: Three times a day (TID) | ORAL | 0 refills | Status: AC

## 2019-04-07 MED ORDER — ACETAMINOPHEN 500 MG PO TABS
1000.0000 mg | ORAL_TABLET | Freq: Once | ORAL | Status: AC
  Administered 2019-04-07: 1000 mg via ORAL
  Filled 2019-04-07: qty 2

## 2019-04-07 NOTE — ED Provider Notes (Signed)
[facility] EMERGENCY DEPARTMENT Provider Note   CSN: [id number] Arrival date & time: 04/07/19  1211     History   Chief Complaint Chief Complaint  Patient presents with   Numbness    Left sided x 1 week    HPI Teresa [name] is a 58 y.o. female with h/o cervicalgia, hiatal hernia, GERD, CVA, HLD, HTN, obesity, headaches presents to ER for evaluation of left sided neck pain that radiates to left upper extremity symptoms described as "pain" like spasms and feeling like it is going "dead" and sore for the last week.  Located from left lateral neck with radiation to left hand.  States there is no numbness, loss of sensation or tingling but spasm like discomfort.  Occasionally worse with movement and neck movements. Better if she massages the arm and rubs pain cream over the arm.  Reports long history of this for "years". She used to go to PT for it but lost insurance and has not been in a while.  She is a hair stylist. States left arm symptoms got worse this morning when she woke up from bed and she also had new right low back pain that has moved up to right thoracic back, onset while in bed waking up.   Back pain has been constant, worse with movement. She thinks it is related to her new mattress.  No known neck injuries, surgeries. No falls.  Also notes increased "gas" and right upper quadrant abdominal discomfort, states she has been doing vegetable/fruit smoothies and adding natural supplements, and since has noticed increased belching, gas, epigastric discomfort. Feels better when passing gas. Reports shortness of breath with exertion "for months", unchanged, but never CP with exertion.  Has told several of her doctors about this shortness of breath and neurologist told her to follow up with cardiology. She has no insurance anymore and has not done this.  Also ran out of one of her Bp medicines a month ago.  Three weeks ago had some difficulty urinating and she took cherry juice  and this resolved.   No Cp, palpitations, HA, loss of sensation or weakness distally. No vision changes, dizziness, speech or swallowing difficulties.       HPI  Past Medical History:  Diagnosis Date   Allergy    Anemia    Cervical disc disorder with radiculopathy of cervical region 02/10/2016   Chronic lower back pain    Diverticulosis    EKG, abnormal 01/06/2017   Gastritis    GERD (gastroesophageal reflux disease)    H/O hiatal hernia    Hiatal hernia    History of amputation of lesser toe of left foot ([place]) 03/11/2015   History of colon cancer    Hypertension    Hyperthyroidism    hx   Hypothyroidism    Lateral knee pain, left 08/06/2013   Migraine headache    "a few times/yr" (03/05/2015)   Neck strain 02/06/2012   Osteomyelitis ([place])    [name] 03/05/2015   Osteomyelitis of toe of left foot ([place]) 03/05/2015   Palpitations 01/05/2016   Positive TB test    Stroke [facility])    Tubular adenoma of colon    Tubular adenoma of colon    Ulnar neuropathy at elbow of right upper extremity 06/12/2018    Patient Active Problem List   Diagnosis Date Noted   Ulnar neuropathy at elbow of right upper extremity 06/12/2018   Numbness and tingling in right hand 05/16/2018   Shortness of  breath 04/20/2018   Stye 04/20/2018   Multiple joint pain 04/20/2018   Migraine 01/12/2018   Ear pain, left 12/13/2017   Edema 11/07/2017   Hyperlipidemia    Cerebrovascular accident (CVA) due to embolism of cerebral artery ([place]) 09/15/2017   History of cholecystectomy 06/27/2017   Small intestinal bacterial overgrowth 03/23/2017   Paresthesia 01/06/2017   Prediabetes 10/29/2015   Tendinopathy of left rotator cuff 10/27/2015   Left shoulder pain 05/28/2015   Essential hypertension    Right shoulder pain 04/15/2014   Obesity 04/15/2014   Cervicalgia 02/11/2014   Allergic rhinoconjunctivitis 07/19/2013   Thyroid nodule 02/12/2013   GERD  (gastroesophageal reflux disease) 02/06/2012   Tobacco abuse 09/21/2011   Hypothyroidism 09/21/2011   Throat pain 09/21/2011    Past Surgical History:  Procedure Laterality Date   ABDOMINAL HYSTERECTOMY  2006   AMPUTATION TOE Left 03/05/2015   Procedure: AMPUTATION LEFT 5TH  TOE;  Surgeon: Wylene Simmer, MD;  Location: [place];  Service: Orthopedics;  Laterality: Left;   APPENDECTOMY  2006   BREAST BIOPSY Left 11/26/2014   CESAREAN SECTION  1979 1986 1990   COLONOSCOPY     GALLBLADDER SURGERY     LOOP RECORDER INSERTION N/A 09/18/2017   Procedure: LOOP RECORDER INSERTION;  Surgeon: Evans Lance, MD;  Location: [facility] CV LAB;  Service: Cardiovascular;  Laterality: N/A;   TEE WITHOUT CARDIOVERSION N/A 09/18/2017   Procedure: TRANSESOPHAGEAL ECHOCARDIOGRAM (TEE);  Surgeon: Pixie Casino, MD;  Location: [facility] ENDOSCOPY;  Service: Cardiovascular;  Laterality: N/A;   TOE AMPUTATION Left 03/05/2015   5th toe   TUBAL LIGATION Bilateral 1990   UPPER GASTROINTESTINAL ENDOSCOPY       OB History   No obstetric history on file.      Home Medications    Prior to Admission medications   Medication Sig Start Date End Date Taking? Authorizing Provider  aspirin 81 MG chewable tablet Chew 1 tablet (81 mg total) by mouth daily. 09/19/17  Yes Sherene Sires, DO  B Complex Vitamins (VITAMIN B COMPLEX PO) Take 1 tablet by mouth daily.   Yes [provider]  Carboxymethylcellulose Sodium (EYE DROPS OP) Apply 1 drop to eye 3 (three) times daily.   Yes [provider]  cycloSPORINE (RESTASIS) 0.05 % ophthalmic emulsion Place 1 drop into both eyes 2 (two) times daily.    Yes [provider]  diclofenac sodium (VOLTAREN) 1 % GEL Apply 2 g topically daily as needed (neck pain).   Yes [provider]  hydrALAZINE (APRESOLINE) 50 MG tablet Take 1 tablet (50 mg total) by mouth 3 (three) times daily. 04/13/18  Yes Skeet Latch, MD  levothyroxine (SYNTHROID,  LEVOTHROID) 125 MCG tablet Take 1 tablet (125 mcg total) by mouth daily. 06/25/18  Yes Elayne Snare, MD  Multiple Vitamins-Minerals (CENTRUM MULTIGUMMIES) CHEW Chew 2 tablets by mouth daily.   Yes [provider]  spironolactone (ALDACTONE) 50 MG tablet Take 1 tablet (50 mg total) by mouth daily. 07/27/18  Yes Kinnie Feil, MD  VITAMIN D PO Take 1 tablet by mouth daily.   Yes [provider]  albuterol (PROVENTIL HFA;VENTOLIN HFA) 108 (90 Base) MCG/ACT inhaler Inhale 2 puffs into the lungs every 6 (six) hours as needed for wheezing or shortness of breath. Patient not taking: Reported on 04/07/2019 04/20/18   Kinnie Feil, MD  carvedilol (COREG) 25 MG tablet Take 1 tablet (25 mg total) by mouth 2 (two) times daily with a meal. Patient not  taking: Reported on 04/07/2019 04/13/18   Skeet Latch, MD  cyclobenzaprine (FLEXERIL) 10 MG tablet Take 1 tablet (10 mg total) by mouth 3 (three) times daily for 7 days. 04/07/19 04/14/19  Kinnie Feil, PA-C  Evolocumab (REPATHA SURECLICK) [id number] MG/ML SOAJ Inject 140 mg into the skin every 14 (fourteen) days. Patient not taking: Reported on 04/07/2019 04/24/18   Skeet Latch, MD  fluticasone [facility]) 50 MCG/ACT nasal spray Place 1 spray into both nostrils daily. Patient not taking: Reported on 04/07/2019 06/25/18   Enrique Sack, FNP  furosemide (LASIX) 20 MG tablet Take 1 tablet (20 mg total) by mouth daily. Patient not taking: Reported on 04/07/2019 05/16/18   Kinnie Feil, MD  metFORMIN (GLUCOPHAGE) 500 MG tablet Take 1 tablet (500 mg total) by mouth daily with breakfast. Patient not taking: Reported on 04/07/2019 04/20/18   Kinnie Feil, MD  methylPREDNISolone (MEDROL DOSEPAK) 4 MG TBPK tablet Take as directed Patient not taking: Reported on 04/07/2019 06/25/18   Enrique Sack, FNP    Family History Family History  Problem Relation Age of Onset   Heart disease Mother        CAB age 47    Diabetes Mother    Cancer Father 49       pancreatic cancer   Stroke Father    Diabetes Sister    Kidney disease Sister        renal failure   Diabetes Brother    Kidney disease Brother        renal failure   Heart disease Other    Diabetes Other    Breast cancer Maternal Aunt        per pt aunts and uncles died of cancer not sure the type   Breast cancer Paternal Aunt    Esophageal cancer Neg Hx    Rectal cancer Neg Hx    Stomach cancer Neg Hx    Thyroid disease Neg Hx     Social History Social History   Tobacco Use   Smoking status: Former Smoker    Packs/day: 0.50    Years: 36.00    Pack years: 18.00    Types: Cigarettes    Quit date: 09/23/2017    Years since quitting: 1.5   Smokeless tobacco: Never Used   Tobacco comment: Tobacco info given 03/01/17  Substance Use Topics   Alcohol use: No   Drug use: No     Allergies   Bactrim [sulfamethoxazole-trimethoprim], Clonidine derivatives, Amlodipine, Lisinopril, Statins, Aspirin, and Latex   Review of Systems Review of Systems  Respiratory: Positive for shortness of breath (for months, unchanged).   Gastrointestinal: Positive for abdominal pain.       Increased gas  Musculoskeletal: Positive for arthralgias (LUE), back pain, myalgias (LUE and hand) and neck pain.  All other systems reviewed and are negative.    Physical Exam Updated Vital Signs BP (!) 186/95    Pulse 61    Temp 98.7 F (37.1 C) (Oral)    Resp 16    Ht 5\' 5"  (1.651 m)    Wt 90.7 kg    LMP  (Exact Date)    SpO2 99%    BMI 33.28 kg/m   Physical Exam Vitals signs and nursing note reviewed.  Constitutional:      General: She is not in acute distress.    Appearance: She is well-developed.     Comments: NAD.  HENT:     Head: Normocephalic and atraumatic.  Right Ear: External ear normal.     Left Ear: External ear normal.     Nose: Nose normal.  Eyes:     General: No scleral icterus.    Conjunctiva/sclera: Conjunctivae  normal.  Neck:     Musculoskeletal: Normal range of motion and neck supple. Muscular tenderness present.     Comments: Mild left sided paraspinal muscular tenderness. No midline tenderness. No meningismus. Negative Adson's test. Spurling's reproduced left lateral neck pain but no radicular symptoms down LUE Cardiovascular:     Rate and Rhythm: Normal rate and regular rhythm.     Heart sounds: Normal heart sounds. No murmur.     Comments: 1+ Dp and radial pulses bilaterally. No LE edema Pulmonary:     Effort: Pulmonary effort is normal.     Breath sounds: Normal breath sounds. No wheezing.  Abdominal:     Palpations: Abdomen is soft.     Tenderness: There is no abdominal tenderness.     Comments: Obese abdomen. No pulsatility. Non tender.   Musculoskeletal: Normal range of motion.        General: Tenderness present. No deformity.     Comments: T-spine: mild right sided muscular tenderness. No midline tenderness.  Pain reproduced with sitting up L-spine: no midline or paraspinal muscle tenderness.   Skin:    General: Skin is warm and dry.     Capillary Refill: Capillary refill takes less than 2 seconds.     Comments: Skin to neck, LUE and back normal  Neurological:     Mental Status: She is alert and oriented to person, place, and time.     Comments:  Mental Status: Patient is awake, alert, oriented to person, place, year, and situation. Patient is able to give a clear and coherent history.  Speech is fluent and clear without dysarthria or aphasia.  No signs of neglect.  Cranial Nerves: I not tested II visual fields full bilaterally. PERRL.  Unable to visualize posterior eye. III, IV, VI EOMs intact without ptosis or diplopia  V sensation to light touch intact in all 3 divisions of trigeminal nerve bilaterally  VII facial movements symmetric bilaterally VIII hearing intact to voice/conversation  IX, X no uvula deviation, symmetric rise of soft palate/uvula XI 5/5 SCM and  trapezius strength bilaterally  XII tongue protrusion midline, symmetric L/R movements  Motor: Strength 5/5 in upper/lower extremities .   Sensation to light touch intact in face, upper/lower extremities. No pronator drift. No leg drop.  Cerebellar: No ataxia with finger to nose.  Steady gait/No truncal sway. Normal Romberg.   Psychiatric:        Behavior: Behavior normal.        Thought Content: Thought content normal.        Judgment: Judgment normal.      ED Treatments / Results  Labs (all labs ordered are listed, but only abnormal results are displayed) Labs Reviewed  CBC - Abnormal; Notable for the following components:      Result Value   RBC 5.44 (*)    MCH 25.7 (*)    All other components within normal limits  I-STAT CHEM 8, ED - Abnormal; Notable for the following components:   Hemoglobin 15.3 (*)    All other components within normal limits  I-STAT BETA HCG BLOOD, ED ([facility], [facility], [facility] ONLY) - Abnormal; Notable for the following components:   I-stat hCG, quantitative 5.7 (*)    All other components within normal limits  TROPONIN I (HIGH SENSITIVITY) -  Abnormal; Notable for the following components:   Troponin I (High Sensitivity) 21 (*)    All other components within normal limits  PROTIME-INR  APTT  DIFFERENTIAL  COMPREHENSIVE METABOLIC PANEL  URINALYSIS, ROUTINE W REFLEX MICROSCOPIC  CBG MONITORING, ED  TROPONIN I (HIGH SENSITIVITY)    EKG EKG Interpretation  Date/Time:  Sunday April 07 2019 13:25:56 EDT Ventricular Rate:  71 PR Interval:    QRS Duration: 83 QT Interval:  428 QTC Calculation: 466 R Axis:   24 Text Interpretation:  Sinus rhythm LAE, consider biatrial enlargement LVH with secondary repolarization abnormality Confirmed by Veryl Speak [phone]) on 04/07/2019 1:28:36 PM   Radiology Ct Head Wo Contrast  Result Date: 04/07/2019 CLINICAL DATA:  Left side numbness for 1 week. Onset blurry vision in left eye today. No known injury. EXAM: CT  HEAD WITHOUT CONTRAST TECHNIQUE: Contiguous axial images were obtained from the base of the skull through the vertex without intravenous contrast. COMPARISON:  Brain MRI 03/11/2018.  Head CT 09/15/2017. FINDINGS: Brain: No evidence of acute infarction, hemorrhage, hydrocephalus, extra-axial collection or mass lesion/mass effect. Scattered small infarcts seen on prior MRI are not visible on this exam. Vascular: No hyperdense vessel or unexpected calcification. Skull: Intact.  No focal lesion. Sinuses/Orbits: Negative. Other: None. IMPRESSION: Negative head CT. Electronically Signed   By: Inge Rise M.D.   On: 04/07/2019 14:36    Procedures Procedures (including critical care time)  Medications Ordered in ED Medications  acetaminophen (TYLENOL) tablet 1,000 mg (1,000 mg Oral Given 04/07/19 1520)  cyclobenzaprine (FLEXERIL) tablet 10 mg (10 mg Oral Given 04/07/19 1521)     Initial Impression / Assessment and Plan / ED Course  I have reviewed the triage vital signs and the nursing notes.  Pertinent labs & imaging results that were available during my care of the patient were reviewed by me and considered in my medical decision making (see chart for details).  58 yo F here with several complaints including acute on chronic left neck pain radiating to left hand, gas, epigastric/RUQ abd pain. Reports chronic un changed SOB, has not f/u with cardiology due to lack of insurance.   Sounds like new symptoms that prompted ER visit is new TL spine right sided back pain today. She thinks back pain is from her old mattress.   No neuro or pulse deficits. No CP. No fall, trauma. No fever. No meningismus on exam.  No complaints of numbness or weakness, other stroke symptoms.   She is hypertensive in setting of partial med compliance.   EMR reviewed. She has h/o cervicalgia, PT for this.  Stroke work up initiated in triage reviewed by me.  Labs unremarkable. EKG without changes, ischemia.  CT head without  acute findings.   Highest on ddx is acute on chronic cervical radiculopathy in LUE and MSK back pain.  Probably not related. She is a hair stylist and LHD. She has old mattress, waiting for new one.  Right TL back pain and is positional and reproducible on exam, but no chest pain or changes to SOB, neuro pulse deficits.  I considered ACS, dissection less likely. No pulsatility or abd tenderness on exam. Less likely UTI/renal stone.   Given elevated BP however and vague back pain, LUE, epigastric symptoms (although chronic), will obtain trop, UA, monitor BP, reassess.   1755: Trop 16 > 21.  Initial one time trop ordered due to vague LUE discomfort, right thoracic back pain and elevated BP. RN obtained a second trop.  She never had CP.  Thoracic back significantly improved with tylenol and flexeril.  Re-evaluated her and it is reproducible with movement and palpation. No neuro/pulse deficits. No longer having LUE discomfort, abd discomfort or tenderness. BP has improved.  Overall clinical picture not consistent with ACS, dissection.  Discussed MDM and consideration of dissection with EDP who also evaluated patient in ER.  She is appropriate for discharge with flexeril, tylenol and tx for MSK etiology/back pain.  Recommended f/u with PCP. Patient is comfortable with this as well, thinks her back pain is muscular from her mattress. Return precautions given.  Final Clinical Impressions(s) / ED Diagnoses   Final diagnoses:  Acute right-sided thoracic back pain  Neck pain on left side    ED Discharge Orders         Ordered    cyclobenzaprine (FLEXERIL) 10 MG tablet  3 times daily     04/07/19 1704           Arlean Hopping 04/07/19 1804    Veryl Speak, MD 04/07/19 2003

## 2019-04-07 NOTE — Discharge Instructions (Addendum)
You were seen in the ER for right sided back pain.  Also reported discomfort in your left neck, upper abdominal pain and gas.  Cardiac enzymes were minimally elevated.  You did not have any chest pain.  Your pain in the back improved after anti-inflammatories and muscle relaxers.  Pain in your back is most likely from a muscular process such as strain or spasms.  Alternate 500 to 1000 mg of acetaminophen every 6-8 hours for pain.  Use Flexeril for muscle spasms and catching.  Apply heat and massage and stretch.  Return to the ER for sudden severe chest pain, severe abdominal and back pain, lightheadedness, passing out, one-sided loss of sensation or weakness

## 2019-04-07 NOTE — ED Notes (Signed)
Discharge instructions and prescription discussed with Pt. Pt verbalized understanding. Pt stable and ambulatory.    

## 2019-04-07 NOTE — ED Triage Notes (Addendum)
Pt reports numbess to her left side x 1 week and blurred vision that started this morning. Pt endorses mid back pain as well that also started this a.m.

## 2019-04-07 NOTE — ED Notes (Signed)
MD Delo at bedside. 

## 2019-04-08 ENCOUNTER — Other Ambulatory Visit: Payer: Self-pay | Admitting: Family Medicine

## 2019-04-08 DIAGNOSIS — I10 Essential (primary) hypertension: Secondary | ICD-10-CM

## 2019-05-01 ENCOUNTER — Ambulatory Visit (INDEPENDENT_AMBULATORY_CARE_PROVIDER_SITE_OTHER): Payer: Self-pay | Admitting: *Deleted

## 2019-05-01 DIAGNOSIS — I10 Essential (primary) hypertension: Secondary | ICD-10-CM

## 2019-05-01 DIAGNOSIS — I6340 Cerebral infarction due to embolism of unspecified cerebral artery: Secondary | ICD-10-CM

## 2019-05-04 LAB — CUP PACEART REMOTE DEVICE CHECK
Date Time Interrogation Session: 20201106121610
Implantable Pulse Generator Implant Date: 20190325

## 2019-05-15 ENCOUNTER — Other Ambulatory Visit: Payer: Self-pay | Admitting: Cardiovascular Disease

## 2019-05-19 NOTE — Progress Notes (Signed)
[facility] Summary Report / Loop Recorder 

## 2019-06-03 ENCOUNTER — Ambulatory Visit (INDEPENDENT_AMBULATORY_CARE_PROVIDER_SITE_OTHER): Payer: Self-pay | Admitting: *Deleted

## 2019-06-03 DIAGNOSIS — I6340 Cerebral infarction due to embolism of unspecified cerebral artery: Secondary | ICD-10-CM

## 2019-06-05 LAB — CUP PACEART REMOTE DEVICE CHECK
Date Time Interrogation Session: 20201209103707
Implantable Pulse Generator Implant Date: 20190325

## 2019-06-20 ENCOUNTER — Telehealth: Payer: Self-pay

## 2019-06-20 NOTE — Telephone Encounter (Signed)
LMOVM for pt to stop sending manual transmissions with home monitor. I left my direct office number for the pt to call if she has any questions.

## 2019-07-02 ENCOUNTER — Other Ambulatory Visit: Payer: Self-pay

## 2019-07-02 ENCOUNTER — Telehealth (INDEPENDENT_AMBULATORY_CARE_PROVIDER_SITE_OTHER): Payer: Self-pay | Admitting: Family Medicine

## 2019-07-02 DIAGNOSIS — U071 COVID-19: Secondary | ICD-10-CM

## 2019-07-02 MED ORDER — LOPERAMIDE HCL 2 MG PO TABS: 2.0000 mg | ORAL_TABLET | Freq: Four times a day (QID) | ORAL | 0 refills | Status: DC | PRN

## 2019-07-02 NOTE — Patient Instructions (Signed)
You should quarantine (isolate at home) until the following criteria are met: At least 10 days since symptoms first appeared and At least 24 hours with no fever without fever-reducing medication and Other symptoms of COVID-19 are improving (Loss of taste and smell may persist for weeks or months after recovery and need not delay the end of isolation)  When to seek emergency medical attention Look for emergency warning signs* for COVID-19. If your or someone you know is showing any of these signs, seek emergency medical care immediately:   Trouble breathing Persistent pain or pressure in the chest New confusion Inability to wake or stay awake Bluish lips or face Inability to tolerate fluids  *This list is not all possible symptoms. We discussed additional symptoms   Call 911 or call ahead to your local emergency facility: Notify the operator that you are seeking care for someone who has or may have COVID-19.

## 2019-07-02 NOTE — Assessment & Plan Note (Signed)
Patient has positive Covid test on 06/23/2019.  She was symptomatic on 06/22/2019.  Her symptoms have included cough, congestion, shortness of breath with exertion, fatigue, body aches, diarrhea.  Most of her symptoms of improved with symptomatic management using Robitussin/Mucinex.  She did report taking a Flexeril once for body aches.  Patient reports she still has severe fatigue and extreme diarrhea.  She has tried no over-the-counter remedies for the diarrhea.  She also takes no medications such as Tylenol for the body aches. -Counseled patient on use of over-the-counter pain medications including acetaminophen -Prescribed Imodium as needed for the diarrhea -Extended patient's quarantine given her symptoms have not improved regarding the fatigue and diarrhea so that her quarantine will end 07/08/2019. -Counseled on wearing a mask, washing hands and avoiding social gatherings  -ED precautions discussed and patient expressed good understanding -Patient instructed to avoid others until they meet criteria for ending isolation after any suspected COVID, which are:  -24 hours with no fever (without use of medicaitons) and -respiratory symptoms have improved (e.g. cough, shortness of breath) or  -10 days since symptoms first appeared

## 2019-07-02 NOTE — Progress Notes (Signed)
[facility] Telemedicine Visit  Patient consented to have virtual visit. Method of visit: Video was attempted, but technology challenges prevented patient from using video, so visit was conducted via telephone.  Encounter participants: Patient: Teresa [name] - located at home  Provider: Gifford Shave - located at home    Chief Complaint: Continuation of Covid symptoms  HPI: Patient reportedly started showing symptoms of COVID-19 on 06/22/2019 which included cough, congestion, fatigue, diarrhea.  She tested positive for COVID-19 on 06/23/2019.  Since that time her symptoms have improved regarding the cough and congestion.  She has been taking Robitussin regularly and recently switched to taking Mucinex nighttime at night which she feels helped.  She does report that she took 1 dose of Flexeril for body aches.  Patient has taken no ibuprofen or Tylenol.  The patient's main complaint is that she is remains fatigued and she is had continuous diarrhea since her diagnosis.  Patient notes she also still has shortness of breath with exertion.  An example would be while making her bed she noticed she was extremely short of breath and had to sit down to recover.  Patient has no shortness of breath at rest.  She has taken nothing for her diarrhea but feels like she is not absorbing enough nutrients which is causing her to be weak.  She is concerned about returning to work given the continuation of the diarrhea and fatigue.  Her work has her scheduled to return on 07/04/2019.  ROS: per HPI  Pertinent PMHx: Hypertension, Covid positive  Exam:  Respiratory: Patient is speaking in clear complete sentences.  No apparent shortness of breath  Assessment/Plan:  COVID-19 Patient has positive Covid test on 06/23/2019.  She was symptomatic on 06/22/2019.  Her symptoms have included cough, congestion, shortness of breath with exertion, fatigue, body aches, diarrhea.  Most of her symptoms of  improved with symptomatic management using Robitussin/Mucinex.  She did report taking a Flexeril once for body aches.  Patient reports she still has severe fatigue and extreme diarrhea.  She has tried no over-the-counter remedies for the diarrhea.  She also takes no medications such as Tylenol for the body aches. -Counseled patient on use of over-the-counter pain medications including acetaminophen -Prescribed Imodium as needed for the diarrhea -Extended patient's quarantine given her symptoms have not improved regarding the fatigue and diarrhea so that her quarantine will end 07/08/2019. -Counseled on wearing a mask, washing hands and avoiding social gatherings  -ED precautions discussed and patient expressed good understanding -Patient instructed to avoid others until they meet criteria for ending isolation after any suspected COVID, which are:  -24 hours with no fever (without use of medicaitons) and -respiratory symptoms have improved (e.g. cough, shortness of breath) or  -10 days since symptoms first appeared

## 2019-07-05 ENCOUNTER — Ambulatory Visit (INDEPENDENT_AMBULATORY_CARE_PROVIDER_SITE_OTHER): Payer: Self-pay | Admitting: *Deleted

## 2019-07-05 DIAGNOSIS — I6340 Cerebral infarction due to embolism of unspecified cerebral artery: Secondary | ICD-10-CM

## 2019-07-08 LAB — CUP PACEART REMOTE DEVICE CHECK
Date Time Interrogation Session: 20210106231625
Implantable Pulse Generator Implant Date: 20190325

## 2019-08-02 ENCOUNTER — Encounter: Payer: Self-pay | Admitting: Family Medicine

## 2019-08-05 ENCOUNTER — Ambulatory Visit (INDEPENDENT_AMBULATORY_CARE_PROVIDER_SITE_OTHER): Payer: Self-pay | Admitting: *Deleted

## 2019-08-05 DIAGNOSIS — I6340 Cerebral infarction due to embolism of unspecified cerebral artery: Secondary | ICD-10-CM

## 2019-08-05 LAB — CUP PACEART REMOTE DEVICE CHECK
Date Time Interrogation Session: 20210208000258
Implantable Pulse Generator Implant Date: 20190325

## 2019-08-06 NOTE — Progress Notes (Signed)
ILR Remote 

## 2019-08-08 LAB — CUP PACEART REMOTE DEVICE CHECK
Date Time Interrogation Session: 20210211105527
Implantable Pulse Generator Implant Date: 20190325

## 2019-08-19 ENCOUNTER — Encounter: Payer: Self-pay | Admitting: Family Medicine

## 2019-08-20 ENCOUNTER — Encounter: Payer: Self-pay | Admitting: Family Medicine

## 2019-08-20 ENCOUNTER — Ambulatory Visit (INDEPENDENT_AMBULATORY_CARE_PROVIDER_SITE_OTHER): Payer: 59 | Admitting: Family Medicine

## 2019-08-20 ENCOUNTER — Other Ambulatory Visit: Payer: Self-pay

## 2019-08-20 VITALS — BP 190/90 | HR 76 | Wt 205.4 lb

## 2019-08-20 DIAGNOSIS — E785 Hyperlipidemia, unspecified: Secondary | ICD-10-CM

## 2019-08-20 DIAGNOSIS — I10 Essential (primary) hypertension: Secondary | ICD-10-CM | POA: Diagnosis not present

## 2019-08-20 DIAGNOSIS — R7303 Prediabetes: Secondary | ICD-10-CM

## 2019-08-20 DIAGNOSIS — E039 Hypothyroidism, unspecified: Secondary | ICD-10-CM | POA: Diagnosis not present

## 2019-08-20 DIAGNOSIS — R7309 Other abnormal glucose: Secondary | ICD-10-CM

## 2019-08-20 DIAGNOSIS — R42 Dizziness and giddiness: Secondary | ICD-10-CM

## 2019-08-20 DIAGNOSIS — R519 Headache, unspecified: Secondary | ICD-10-CM

## 2019-08-20 DIAGNOSIS — Z1231 Encounter for screening mammogram for malignant neoplasm of breast: Secondary | ICD-10-CM | POA: Diagnosis not present

## 2019-08-20 DIAGNOSIS — Z23 Encounter for immunization: Secondary | ICD-10-CM | POA: Diagnosis not present

## 2019-08-20 LAB — POCT GLYCOSYLATED HEMOGLOBIN (HGB A1C): HbA1c, POC (controlled diabetic range): 6 % (ref 0.0–7.0)

## 2019-08-20 MED ORDER — SPIRONOLACTONE 50 MG PO TABS: 50.0000 mg | ORAL_TABLET | Freq: Every day | ORAL | 1 refills | Status: DC

## 2019-08-20 MED ORDER — CARVEDILOL 12.5 MG PO TABS: 12.5000 mg | ORAL_TABLET | Freq: Two times a day (BID) | ORAL | 1 refills | Status: DC

## 2019-08-20 MED ORDER — HYDRALAZINE HCL 50 MG PO TABS: 50.0000 mg | ORAL_TABLET | Freq: Four times a day (QID) | ORAL | 1 refills | Status: DC

## 2019-08-20 MED ORDER — FLUTICASONE PROPIONATE 50 MCG/ACT NA SUSP: 1.0000 | Freq: Every day | NASAL | 0 refills | Status: DC

## 2019-08-20 NOTE — Assessment & Plan Note (Signed)
FLP today 

## 2019-08-20 NOTE — Assessment & Plan Note (Signed)
BP elevated due to poor medication adherence due to cost. Now she has insurance and is able to get her meds. I refilled her Aldactone. Increased Hydralazine to 50 mg QID from TID. She is willing to get back on Coreg and let me know if she experiences any s/e. Start Coreg at a lower dose of 12.5 mg BID. Hold Lasix for now. F/U in 4 weeks for BP eval. Continue home BP monitoring.

## 2019-08-20 NOTE — Assessment & Plan Note (Signed)
TSH checked today.

## 2019-08-20 NOTE — Assessment & Plan Note (Signed)
Chronic since 2017. Likely vertigo. No neurologic deficit on exam. ENT exam benign and her gait normal. She had ENT eval in the past. I am uncertain what diagnosis she is referral to, as she mentioned that the ENT said she has a syndrome causing her problem. I mentioned meniere's but she said it is called something deferent. Monitor closely for now. Consider referral back to the ENT. ED visit if symptoms worsens. I will give her a follow-up call this week.

## 2019-08-20 NOTE — Patient Instructions (Signed)
It was nice seeing you today. I refilled your BP meds so you can get back on as soon as possible. I  Have restarted your Coreg at a lower dose, please let me know as soon as possible if you react to it. Continue current dose of Spironolactone and take Hydralazine 50 mg QID. I will see you in 4 weeks.

## 2019-08-20 NOTE — Assessment & Plan Note (Signed)
A1C remains at 6.0 despite not on Metformin. D/C Metformin all together. Continue diet and exercise control.

## 2019-08-20 NOTE — Progress Notes (Signed)
SUBJECTIVE:   CHIEF COMPLAINT / HPI:  Hypertension This is a chronic problem. The problem has been gradually worsening (Off medicine for over 4 months. She ran out of her insurance, hence was not able to get her meds. ) since onset. Associated symptoms include headaches. Pertinent negatives include no blurred vision. There are no associated agents to hypertension. Risk factors for coronary artery disease include obesity. Treatments tried: Off meds for 4 months. She was on Hydralazine 50 mg TID, Aldactone 50 qd. She is uncertain if she took her Lasix and feels like she had not taken her Coreg in a while. Compliance problems include medication cost.  hx of CVA. Identifiable causes of hypertension include a thyroid problem.  Headache  This is a recurrent problem. Episode onset: Few days ago. Episode frequency: Used to have daily headache end of 2020, but now about 2-3 per week or less. Pain location: Headache is located around her sinuses above her nose ridge. The pain does not radiate. The pain quality is similar to prior headaches (She stated it felt like the headache she had years ago when she had a stroke). The quality of the pain is described as aching and sharp. The pain is at a severity of 0/10 (She is currently asymtomatic, but at times, it could be about 5/10 in severity). Associated symptoms include dizziness and sinus pressure. Pertinent negatives include no blurred vision, coughing, drainage, hearing loss, loss of balance, nausea, numbness, rhinorrhea or tinnitus. Nothing aggravates the symptoms. She has tried NSAIDs (Nasal spray improved her symptoms a lot) for the symptoms. Her past medical history is significant for hypertension, migraine headaches and sinus disease. (Note that she tested positive for COVID-19 in Dec 2020)  Dizziness This is a recurrent problem. The current episode started more than 1 month ago. The problem occurs intermittently. The problem has been waxing and waning.  Associated symptoms include headaches. Pertinent negatives include no coughing, nausea or numbness. The symptoms are aggravated by bending and standing. Treatments tried: Staying still. The treatment provided moderate relief.  PreDM2:Off Metformin for months, she is here for f/u. Hypothyroidism/HLD: On Synthroid. Not taking her Cholesterol meds.  PERTINENT  PMH / PSH: PMH reviewed  OBJECTIVE:   BP (!) 190/90   Pulse 76   Wt 205 lb 6.4 oz (93.2 kg)   SpO2 97%   BMI 34.18 kg/m   Body mass index is 34.18 kg/m.  Physical Exam Vitals and nursing note reviewed.  Constitutional:      Appearance: She is not ill-appearing.  HENT:     Head: Normocephalic and atraumatic.     Right Ear: Tympanic membrane, ear canal and external ear normal.     Left Ear: Tympanic membrane, ear canal and external ear normal.  Eyes:     Extraocular Movements: Extraocular movements intact.  Cardiovascular:     Rate and Rhythm: Normal rate and regular rhythm.     Heart sounds: Normal heart sounds. No murmur.  Pulmonary:     Effort: Pulmonary effort is normal. No respiratory distress.     Breath sounds: Normal breath sounds. No wheezing.  Abdominal:     General: Bowel sounds are normal.     Palpations: Abdomen is soft. There is no mass.  Musculoskeletal:     Cervical back: Normal range of motion. No rigidity.     Right lower leg: No edema.     Left lower leg: No edema.  Neurological:     Mental Status: She is alert.  Cranial Nerves: Cranial nerves are intact.     Sensory: No sensory deficit.     Motor: Motor function is intact.     Coordination: Coordination is intact.     Deep Tendon Reflexes: Reflexes normal.     Comments: Kernig sign normal      ASSESSMENT/PLAN:   No problem-specific Assessment & Plan notes found for this encounter.    Mammogram slip given and test ordered. [name], MD [place]

## 2019-08-20 NOTE — Assessment & Plan Note (Addendum)
No neurologic deficit. Likely multifactorial : Migraine, sinus headache and elevated BP. We will have her restart her antihypertensive regimen. Per the patient, she got better with Flonase, I refilled this. Use Tylenol or Ibuprofen as needed for pain. Doubt stroke since this has been since Dec last year, I.e > 2 months ago and it is improving. However, ED precaution discussed. Normal CT head from Oct 2020. Consider repeat if there is no improvement. F/U soon if symptoms does not improve. She agreed with the plan.

## 2019-08-21 ENCOUNTER — Telehealth: Payer: Self-pay | Admitting: Family Medicine

## 2019-08-21 LAB — LIPID PANEL
Chol/HDL Ratio: 3.7 ratio (ref 0.0–4.4)
Cholesterol, Total: 163 mg/dL (ref 100–199)
HDL: 44 mg/dL (ref >39)
LDL Chol Calc (NIH): 108 mg/dL — ABNORMAL HIGH (ref 0–99)
Triglycerides: 55 mg/dL (ref 0–149)
VLDL Cholesterol Cal: 11 mg/dL (ref 5–40)

## 2019-08-21 LAB — BASIC METABOLIC PANEL
BUN/Creatinine Ratio: 11 (ref 9–23)
BUN: 12 mg/dL (ref 6–24)
CO2: 23 mmol/L (ref 20–29)
Calcium: 10.2 mg/dL (ref 8.7–10.2)
Chloride: 103 mmol/L (ref 96–106)
Creatinine, Ser: 1.08 mg/dL — ABNORMAL HIGH (ref 0.57–1.00)
GFR calc Af Amer: 66 mL/min/{1.73_m2} (ref >59)
GFR calc non Af Amer: 57 mL/min/{1.73_m2} — ABNORMAL LOW (ref >59)
Glucose: 113 mg/dL — ABNORMAL HIGH (ref 65–99)
Potassium: 4.8 mmol/L (ref 3.5–5.2)
Sodium: 140 mmol/L (ref 134–144)

## 2019-08-21 LAB — TSH: TSH: 6.75 u[IU]/mL — ABNORMAL HIGH (ref 0.450–4.500)

## 2019-08-21 NOTE — Telephone Encounter (Signed)
HIPAA compliant callback message left.  Need to discuss lab with her. Please have her see me soon or virtual visit.

## 2019-08-22 ENCOUNTER — Telehealth: Payer: Self-pay | Admitting: Family Medicine

## 2019-08-22 ENCOUNTER — Encounter: Payer: Self-pay | Admitting: Family Medicine

## 2019-08-22 NOTE — Telephone Encounter (Signed)
Test result discussed with her.  TSH elevated: Plan to increase Synthroid dose. She agreed with the plan.  LDL discussed. Unable to take Statins due to allergies. She was plugged in with the lipid clinic for [place], but never got it due to insurance. We will discuss further at subsequent visit. She did say she is working on diet as well for now.

## 2019-08-23 ENCOUNTER — Encounter: Payer: Self-pay | Admitting: Family Medicine

## 2019-08-26 ENCOUNTER — Other Ambulatory Visit: Payer: Self-pay | Admitting: Family Medicine

## 2019-08-26 DIAGNOSIS — J329 Chronic sinusitis, unspecified: Secondary | ICD-10-CM

## 2019-08-26 DIAGNOSIS — G8929 Other chronic pain: Secondary | ICD-10-CM

## 2019-08-26 DIAGNOSIS — R070 Pain in throat: Secondary | ICD-10-CM

## 2019-08-26 MED ORDER — AZITHROMYCIN 250 MG PO TABS: ORAL_TABLET | ORAL | 0 refills | Status: DC

## 2019-09-05 ENCOUNTER — Other Ambulatory Visit: Payer: Self-pay | Admitting: Family Medicine

## 2019-09-05 ENCOUNTER — Ambulatory Visit (INDEPENDENT_AMBULATORY_CARE_PROVIDER_SITE_OTHER): Payer: 59 | Admitting: *Deleted

## 2019-09-05 DIAGNOSIS — I63449 Cerebral infarction due to embolism of unspecified cerebellar artery: Secondary | ICD-10-CM | POA: Diagnosis not present

## 2019-09-05 DIAGNOSIS — Z1231 Encounter for screening mammogram for malignant neoplasm of breast: Secondary | ICD-10-CM

## 2019-09-05 LAB — CUP PACEART REMOTE DEVICE CHECK
Date Time Interrogation Session: 20210311002812
Implantable Pulse Generator Implant Date: 20190325

## 2019-09-06 ENCOUNTER — Ambulatory Visit
Admission: RE | Admit: 2019-09-06 | Discharge: 2019-09-06 | Disposition: A | Discharge status: Home / Self Care | Payer: 59 | Source: Ambulatory Visit | Attending: Family Medicine | Admitting: Family Medicine

## 2019-09-06 ENCOUNTER — Other Ambulatory Visit: Payer: Self-pay

## 2019-09-06 DIAGNOSIS — Z1231 Encounter for screening mammogram for malignant neoplasm of breast: Secondary | ICD-10-CM

## 2019-09-06 NOTE — Progress Notes (Signed)
ILR Remote 

## 2019-09-11 ENCOUNTER — Encounter: Payer: Self-pay | Admitting: Family Medicine

## 2019-09-13 ENCOUNTER — Inpatient Hospital Stay (HOSPITAL_COMMUNITY)
Admission: EM | Admit: 2019-09-13 | Discharge: 2019-09-19 | DRG: 511 | Disposition: A | Discharge status: Home / Self Care | Payer: 59 | Attending: Internal Medicine | Admitting: Internal Medicine

## 2019-09-13 ENCOUNTER — Emergency Department (HOSPITAL_COMMUNITY): Payer: 59

## 2019-09-13 ENCOUNTER — Encounter (HOSPITAL_COMMUNITY): Payer: Self-pay | Admitting: Internal Medicine

## 2019-09-13 DIAGNOSIS — E669 Obesity, unspecified: Secondary | ICD-10-CM | POA: Diagnosis present

## 2019-09-13 DIAGNOSIS — R0789 Other chest pain: Secondary | ICD-10-CM | POA: Diagnosis present

## 2019-09-13 DIAGNOSIS — S5291XA Unspecified fracture of right forearm, initial encounter for closed fracture: Secondary | ICD-10-CM | POA: Diagnosis not present

## 2019-09-13 DIAGNOSIS — S52201B Unspecified fracture of shaft of right ulna, initial encounter for open fracture type I or II: Principal | ICD-10-CM | POA: Diagnosis present

## 2019-09-13 DIAGNOSIS — M5010 Cervical disc disorder with radiculopathy, unspecified cervical region: Secondary | ICD-10-CM | POA: Diagnosis present

## 2019-09-13 DIAGNOSIS — Z79890 Hormone replacement therapy (postmenopausal): Secondary | ICD-10-CM

## 2019-09-13 DIAGNOSIS — Z720 Tobacco use: Secondary | ICD-10-CM | POA: Diagnosis not present

## 2019-09-13 DIAGNOSIS — D126 Benign neoplasm of colon, unspecified: Secondary | ICD-10-CM | POA: Diagnosis present

## 2019-09-13 DIAGNOSIS — Z8673 Personal history of transient ischemic attack (TIA), and cerebral infarction without residual deficits: Secondary | ICD-10-CM | POA: Diagnosis not present

## 2019-09-13 DIAGNOSIS — E662 Morbid (severe) obesity with alveolar hypoventilation: Secondary | ICD-10-CM | POA: Diagnosis not present

## 2019-09-13 DIAGNOSIS — I110 Hypertensive heart disease with heart failure: Secondary | ICD-10-CM | POA: Diagnosis present

## 2019-09-13 DIAGNOSIS — Z6836 Body mass index (BMI) 36.0-36.9, adult: Secondary | ICD-10-CM | POA: Diagnosis not present

## 2019-09-13 DIAGNOSIS — M542 Cervicalgia: Secondary | ICD-10-CM | POA: Diagnosis present

## 2019-09-13 DIAGNOSIS — S52301A Unspecified fracture of shaft of right radius, initial encounter for closed fracture: Secondary | ICD-10-CM | POA: Diagnosis present

## 2019-09-13 DIAGNOSIS — Z882 Allergy status to sulfonamides status: Secondary | ICD-10-CM

## 2019-09-13 DIAGNOSIS — I10 Essential (primary) hypertension: Secondary | ICD-10-CM | POA: Diagnosis present

## 2019-09-13 DIAGNOSIS — E038 Other specified hypothyroidism: Secondary | ICD-10-CM | POA: Diagnosis not present

## 2019-09-13 DIAGNOSIS — Y92410 Unspecified street and highway as the place of occurrence of the external cause: Secondary | ICD-10-CM

## 2019-09-13 DIAGNOSIS — Z888 Allergy status to other drugs, medicaments and biological substances status: Secondary | ICD-10-CM | POA: Diagnosis not present

## 2019-09-13 DIAGNOSIS — Z20822 Contact with and (suspected) exposure to covid-19: Secondary | ICD-10-CM | POA: Diagnosis present

## 2019-09-13 DIAGNOSIS — Z7982 Long term (current) use of aspirin: Secondary | ICD-10-CM

## 2019-09-13 DIAGNOSIS — Z6834 Body mass index (BMI) 34.0-34.9, adult: Secondary | ICD-10-CM | POA: Diagnosis not present

## 2019-09-13 DIAGNOSIS — E785 Hyperlipidemia, unspecified: Secondary | ICD-10-CM | POA: Diagnosis present

## 2019-09-13 DIAGNOSIS — Z89422 Acquired absence of other left toe(s): Secondary | ICD-10-CM | POA: Diagnosis not present

## 2019-09-13 DIAGNOSIS — M545 Low back pain, unspecified: Secondary | ICD-10-CM | POA: Diagnosis present

## 2019-09-13 DIAGNOSIS — E871 Hypo-osmolality and hyponatremia: Secondary | ICD-10-CM | POA: Diagnosis present

## 2019-09-13 DIAGNOSIS — Z823 Family history of stroke: Secondary | ICD-10-CM

## 2019-09-13 DIAGNOSIS — E039 Hypothyroidism, unspecified: Secondary | ICD-10-CM | POA: Diagnosis present

## 2019-09-13 DIAGNOSIS — R110 Nausea: Secondary | ICD-10-CM | POA: Diagnosis present

## 2019-09-13 DIAGNOSIS — I5032 Chronic diastolic (congestive) heart failure: Secondary | ICD-10-CM | POA: Diagnosis present

## 2019-09-13 DIAGNOSIS — Z87891 Personal history of nicotine dependence: Secondary | ICD-10-CM | POA: Diagnosis present

## 2019-09-13 DIAGNOSIS — S52611A Displaced fracture of right ulna styloid process, initial encounter for closed fracture: Secondary | ICD-10-CM | POA: Diagnosis present

## 2019-09-13 DIAGNOSIS — Z803 Family history of malignant neoplasm of breast: Secondary | ICD-10-CM

## 2019-09-13 DIAGNOSIS — G43909 Migraine, unspecified, not intractable, without status migrainosus: Secondary | ICD-10-CM | POA: Diagnosis present

## 2019-09-13 DIAGNOSIS — Z886 Allergy status to analgesic agent status: Secondary | ICD-10-CM | POA: Diagnosis not present

## 2019-09-13 DIAGNOSIS — E876 Hypokalemia: Secondary | ICD-10-CM | POA: Diagnosis present

## 2019-09-13 DIAGNOSIS — E7800 Pure hypercholesterolemia, unspecified: Secondary | ICD-10-CM | POA: Diagnosis not present

## 2019-09-13 DIAGNOSIS — Z8249 Family history of ischemic heart disease and other diseases of the circulatory system: Secondary | ICD-10-CM | POA: Diagnosis not present

## 2019-09-13 DIAGNOSIS — G8929 Other chronic pain: Secondary | ICD-10-CM | POA: Diagnosis present

## 2019-09-13 DIAGNOSIS — Z91040 Latex allergy status: Secondary | ICD-10-CM | POA: Diagnosis not present

## 2019-09-13 DIAGNOSIS — Z79899 Other long term (current) drug therapy: Secondary | ICD-10-CM

## 2019-09-13 DIAGNOSIS — I160 Hypertensive urgency: Secondary | ICD-10-CM | POA: Diagnosis present

## 2019-09-13 DIAGNOSIS — K449 Diaphragmatic hernia without obstruction or gangrene: Secondary | ICD-10-CM | POA: Diagnosis present

## 2019-09-13 DIAGNOSIS — Z833 Family history of diabetes mellitus: Secondary | ICD-10-CM

## 2019-09-13 DIAGNOSIS — S5291XC Unspecified fracture of right forearm, initial encounter for open fracture type IIIA, IIIB, or IIIC: Secondary | ICD-10-CM | POA: Diagnosis not present

## 2019-09-13 DIAGNOSIS — Z841 Family history of disorders of kidney and ureter: Secondary | ICD-10-CM

## 2019-09-13 DIAGNOSIS — I5189 Other ill-defined heart diseases: Secondary | ICD-10-CM | POA: Diagnosis present

## 2019-09-13 LAB — RESPIRATORY PANEL BY RT PCR (FLU A&B, COVID)
Influenza A by PCR: NEGATIVE
Influenza B by PCR: NEGATIVE
SARS Coronavirus 2 by RT PCR: NEGATIVE

## 2019-09-13 LAB — COMPREHENSIVE METABOLIC PANEL
ALT: 26 U/L (ref 0–44)
AST: 19 U/L (ref 15–41)
Albumin: 4.1 g/dL (ref 3.5–5.0)
Alkaline Phosphatase: 69 U/L (ref 38–126)
Anion gap: 14 (ref 5–15)
BUN: 10 mg/dL (ref 6–20)
CO2: 19 mmol/L — ABNORMAL LOW (ref 22–32)
Calcium: 9.3 mg/dL (ref 8.9–10.3)
Chloride: 101 mmol/L (ref 98–111)
Creatinine, Ser: 0.98 mg/dL (ref 0.44–1.00)
GFR calc Af Amer: >60 mL/min (ref >60)
GFR calc non Af Amer: >60 mL/min (ref >60)
Glucose, Bld: 124 mg/dL — ABNORMAL HIGH (ref 70–99)
Potassium: 3.4 mmol/L — ABNORMAL LOW (ref 3.5–5.1)
Sodium: 134 mmol/L — ABNORMAL LOW (ref 135–145)
Total Bilirubin: 0.8 mg/dL (ref 0.3–1.2)
Total Protein: 7.2 g/dL (ref 6.5–8.1)

## 2019-09-13 LAB — CBC WITH DIFFERENTIAL/PLATELET
Abs Immature Granulocytes: 0.03 10*3/uL (ref 0.00–0.07)
Basophils Absolute: 0.1 10*3/uL (ref 0.0–0.1)
Basophils Relative: 1 %
Eosinophils Absolute: 0.1 10*3/uL (ref 0.0–0.5)
Eosinophils Relative: 1 %
HCT: 42.5 % (ref 36.0–46.0)
Hemoglobin: 13.1 g/dL (ref 12.0–15.0)
Immature Granulocytes: 0 %
Lymphocytes Relative: 29 %
Lymphs Abs: 2.1 10*3/uL (ref 0.7–4.0)
MCH: 25.9 pg — ABNORMAL LOW (ref 26.0–34.0)
MCHC: 30.8 g/dL (ref 30.0–36.0)
MCV: 84.2 fL (ref 80.0–100.0)
Monocytes Absolute: 0.3 10*3/uL (ref 0.1–1.0)
Monocytes Relative: 5 %
Neutro Abs: 4.6 10*3/uL (ref 1.7–7.7)
Neutrophils Relative %: 64 %
Platelets: 308 10*3/uL (ref 150–400)
RBC: 5.05 MIL/uL (ref 3.87–5.11)
RDW: 13.5 % (ref 11.5–15.5)
WBC: 7.2 10*3/uL (ref 4.0–10.5)
nRBC: 0 % (ref 0.0–0.2)

## 2019-09-13 MED ORDER — CEFAZOLIN SODIUM-DEXTROSE 1-4 GM/50ML-% IV SOLN
1.0000 g | Freq: Once | INTRAVENOUS | Status: AC
  Administered 2019-09-13: 19:00:00 1 g via INTRAVENOUS
  Filled 2019-09-13: qty 50

## 2019-09-13 MED ORDER — HYDRALAZINE HCL 20 MG/ML IJ SOLN
10.0000 mg | Freq: Once | INTRAMUSCULAR | Status: AC
  Administered 2019-09-13: 10 mg via INTRAVENOUS
  Filled 2019-09-13: qty 1

## 2019-09-13 MED ORDER — CARVEDILOL 12.5 MG PO TABS
12.5000 mg | ORAL_TABLET | Freq: Two times a day (BID) | ORAL | Status: DC
  Administered 2019-09-14 – 2019-09-19 (×11): 12.5 mg via ORAL
  Filled 2019-09-13 (×11): qty 1

## 2019-09-13 MED ORDER — HYDRALAZINE HCL 20 MG/ML IJ SOLN
10.0000 mg | INTRAMUSCULAR | Status: DC | PRN
  Administered 2019-09-14: 10 mg via INTRAVENOUS
  Filled 2019-09-13: qty 1

## 2019-09-13 MED ORDER — TETANUS-DIPHTH-ACELL PERTUSSIS 5-2.5-18.5 LF-MCG/0.5 IM SUSP
0.5000 mL | Freq: Once | INTRAMUSCULAR | Status: DC
  Filled 2019-09-13: qty 0.5

## 2019-09-13 MED ORDER — HYDROMORPHONE HCL 1 MG/ML IJ SOLN
1.0000 mg | Freq: Once | INTRAMUSCULAR | Status: AC
  Administered 2019-09-13: 1 mg via INTRAVENOUS
  Filled 2019-09-13: qty 1

## 2019-09-13 MED ORDER — MORPHINE SULFATE (PF) 2 MG/ML IV SOLN
2.0000 mg | INTRAVENOUS | Status: DC | PRN
  Administered 2019-09-14 (×3): 2 mg via INTRAVENOUS
  Filled 2019-09-13 (×3): qty 1

## 2019-09-13 MED ORDER — CYCLOBENZAPRINE HCL 10 MG PO TABS
10.0000 mg | ORAL_TABLET | Freq: Once | ORAL | Status: AC
  Administered 2019-09-13: 10 mg via ORAL
  Filled 2019-09-13: qty 1

## 2019-09-13 MED ORDER — ONDANSETRON HCL 4 MG/2ML IJ SOLN
4.0000 mg | Freq: Four times a day (QID) | INTRAMUSCULAR | Status: DC | PRN
  Administered 2019-09-15: 4 mg via INTRAVENOUS

## 2019-09-13 MED ORDER — ONDANSETRON HCL 4 MG PO TABS: 4.0000 mg | ORAL_TABLET | Freq: Four times a day (QID) | ORAL | Status: DC | PRN

## 2019-09-13 MED ORDER — ONDANSETRON HCL 4 MG/2ML IJ SOLN
4.0000 mg | Freq: Four times a day (QID) | INTRAMUSCULAR | Status: DC
  Administered 2019-09-13 – 2019-09-19 (×15): 4 mg via INTRAVENOUS
  Filled 2019-09-13 (×16): qty 2

## 2019-09-13 MED ORDER — SPIRONOLACTONE 25 MG PO TABS
50.0000 mg | ORAL_TABLET | Freq: Every day | ORAL | Status: DC
  Administered 2019-09-14 – 2019-09-19 (×6): 50 mg via ORAL
  Filled 2019-09-13 (×7): qty 2

## 2019-09-13 MED ORDER — MORPHINE SULFATE (PF) 2 MG/ML IV SOLN
2.0000 mg | Freq: Once | INTRAVENOUS | Status: AC
  Administered 2019-09-13: 2 mg via INTRAVENOUS
  Filled 2019-09-13: qty 1

## 2019-09-13 MED ORDER — MORPHINE SULFATE (PF) 4 MG/ML IV SOLN
4.0000 mg | Freq: Once | INTRAVENOUS | Status: AC
  Administered 2019-09-13: 4 mg via INTRAVENOUS
  Filled 2019-09-13: qty 1

## 2019-09-13 MED ORDER — CEFAZOLIN SODIUM-DEXTROSE 2-4 GM/100ML-% IV SOLN
2.0000 g | INTRAVENOUS | Status: AC
  Administered 2019-09-14: 2 g via INTRAVENOUS
  Filled 2019-09-13: qty 100

## 2019-09-13 MED ORDER — ACETAMINOPHEN 650 MG RE SUPP: 650.0000 mg | Freq: Four times a day (QID) | RECTAL | Status: DC | PRN

## 2019-09-13 MED ORDER — LABETALOL HCL 5 MG/ML IV SOLN
10.0000 mg | Freq: Once | INTRAVENOUS | Status: AC
  Administered 2019-09-13: 10 mg via INTRAVENOUS
  Filled 2019-09-13: qty 4

## 2019-09-13 MED ORDER — ACETAMINOPHEN 325 MG PO TABS
650.0000 mg | ORAL_TABLET | Freq: Four times a day (QID) | ORAL | Status: DC | PRN
  Administered 2019-09-14 – 2019-09-17 (×2): 650 mg via ORAL
  Filled 2019-09-13 (×2): qty 2

## 2019-09-13 MED ORDER — LEVOTHYROXINE SODIUM 25 MCG PO TABS
125.0000 ug | ORAL_TABLET | Freq: Every day | ORAL | Status: DC
  Administered 2019-09-14 – 2019-09-19 (×6): 125 ug via ORAL
  Filled 2019-09-13 (×6): qty 1

## 2019-09-13 MED ORDER — HYDRALAZINE HCL 50 MG PO TABS
50.0000 mg | ORAL_TABLET | Freq: Four times a day (QID) | ORAL | Status: DC
  Administered 2019-09-14 – 2019-09-16 (×9): 50 mg via ORAL
  Filled 2019-09-13 (×9): qty 1

## 2019-09-13 MED ORDER — ALBUTEROL SULFATE (2.5 MG/3ML) 0.083% IN NEBU: 2.5000 mg | INHALATION_SOLUTION | Freq: Four times a day (QID) | RESPIRATORY_TRACT | Status: DC | PRN
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
## 2019-09-13 NOTE — Progress Notes (Signed)
Orthopedic Tech Progress Note Patient Details:  Teresa [name] 1961-04-02 [id number]  Ortho Devices Type of Ortho Device: Arm sling, Sugartong splint Ortho Device/Splint Location: rue Ortho Device/Splint Interventions: Ordered, Application, Adjustment   Post Interventions Patient Tolerated: Well Instructions Provided: Care of device, Adjustment of device   Teresa [name] 09/13/2019, 8:37 PM
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
## 2019-09-13 NOTE — Progress Notes (Signed)
Patient arrived to unit complaining of 4/10 right upper extremity pain and nausea, no PRN orders noted, Dr. Hal Hope paged.

## 2019-09-13 NOTE — Consult Note (Signed)
I was asked to assist with the management of Teresa [name]. The patient does have the Displaced right both bone forearm fracture The patient was irrigated and splinted in the ER The patient will be admitted for management   Hypertension pain control and plan for operative intervention tomorrow The plan will be for open reduction and internal fixation of the radius and ulna shafts. The patient does have the highly unstable both bone forearm fracture needing surgery. Orders will be placed for surgery. Nothing by mouth at midnight Please contact me directly if you have any questions [phone]

## 2019-09-13 NOTE — ED Notes (Signed)
Spoke to [employer], concerns about pt bp not having responded to labetalol 10mg  at 2100. BP rechecked, confirmed 190/112mmHg with pt legs not crossed and not moving/talking. Admitting messaged; new orders for hydralazine 10mg  IV placed, would like SBP 180 or less from this intervention.

## 2019-09-14 ENCOUNTER — Encounter (HOSPITAL_COMMUNITY)
Admission: EM | Disposition: A | Discharge status: Home / Self Care | Payer: Self-pay | Source: Home / Self Care | Attending: Internal Medicine

## 2019-09-14 ENCOUNTER — Inpatient Hospital Stay (HOSPITAL_COMMUNITY): Payer: 59 | Admitting: Anesthesiology

## 2019-09-14 ENCOUNTER — Encounter (HOSPITAL_COMMUNITY): Payer: Self-pay | Admitting: Internal Medicine

## 2019-09-14 ENCOUNTER — Inpatient Hospital Stay (HOSPITAL_COMMUNITY): Payer: 59

## 2019-09-14 DIAGNOSIS — M545 Low back pain, unspecified: Secondary | ICD-10-CM | POA: Diagnosis present

## 2019-09-14 DIAGNOSIS — E669 Obesity, unspecified: Secondary | ICD-10-CM | POA: Diagnosis present

## 2019-09-14 DIAGNOSIS — I5189 Other ill-defined heart diseases: Secondary | ICD-10-CM | POA: Diagnosis present

## 2019-09-14 DIAGNOSIS — G8929 Other chronic pain: Secondary | ICD-10-CM | POA: Diagnosis present

## 2019-09-14 DIAGNOSIS — D126 Benign neoplasm of colon, unspecified: Secondary | ICD-10-CM | POA: Diagnosis present

## 2019-09-14 DIAGNOSIS — S5291XB Unspecified fracture of right forearm, initial encounter for open fracture type I or II: Secondary | ICD-10-CM

## 2019-09-14 DIAGNOSIS — I5032 Chronic diastolic (congestive) heart failure: Secondary | ICD-10-CM | POA: Diagnosis present

## 2019-09-14 HISTORY — PX: OPEN REDUCTION INTERNAL FIXATION (ORIF) DISTAL RADIAL FRACTURE: SHX5989

## 2019-09-14 HISTORY — PX: ORIF ULNAR FRACTURE: SHX5417

## 2019-09-14 LAB — BASIC METABOLIC PANEL
Anion gap: 13 (ref 5–15)
BUN: 11 mg/dL (ref 6–20)
CO2: 23 mmol/L (ref 22–32)
Calcium: 9.7 mg/dL (ref 8.9–10.3)
Chloride: 100 mmol/L (ref 98–111)
Creatinine, Ser: 1.03 mg/dL — ABNORMAL HIGH (ref 0.44–1.00)
GFR calc Af Amer: >60 mL/min (ref >60)
GFR calc non Af Amer: >60 mL/min (ref >60)
Glucose, Bld: 125 mg/dL — ABNORMAL HIGH (ref 70–99)
Potassium: 4.2 mmol/L (ref 3.5–5.1)
Sodium: 136 mmol/L (ref 135–145)

## 2019-09-14 LAB — CBC
HCT: 41.6 % (ref 36.0–46.0)
Hemoglobin: 13.1 g/dL (ref 12.0–15.0)
MCH: 25.8 pg — ABNORMAL LOW (ref 26.0–34.0)
MCHC: 31.5 g/dL (ref 30.0–36.0)
MCV: 82.1 fL (ref 80.0–100.0)
Platelets: 321 10*3/uL (ref 150–400)
RBC: 5.07 MIL/uL (ref 3.87–5.11)
RDW: 13.4 % (ref 11.5–15.5)
WBC: 8.7 10*3/uL (ref 4.0–10.5)
nRBC: 0 % (ref 0.0–0.2)

## 2019-09-14 LAB — SURGICAL PCR SCREEN
MRSA, PCR: NEGATIVE
Staphylococcus aureus: NEGATIVE

## 2019-09-14 LAB — HIV ANTIBODY (ROUTINE TESTING W REFLEX): HIV Screen 4th Generation wRfx: NONREACTIVE

## 2019-09-14 SURGERY — OPEN REDUCTION INTERNAL FIXATION (ORIF) DISTAL RADIUS FRACTURE
Anesthesia: Regional | Site: Arm Lower | Laterality: Right

## 2019-09-14 MED ORDER — FENTANYL CITRATE (PF) 250 MCG/5ML IJ SOLN
INTRAMUSCULAR | Status: AC
  Filled 2019-09-14: qty 5

## 2019-09-14 MED ORDER — PHENYLEPHRINE 40 MCG/ML (10ML) SYRINGE FOR IV PUSH (FOR BLOOD PRESSURE SUPPORT)
PREFILLED_SYRINGE | INTRAVENOUS | Status: DC | PRN
  Administered 2019-09-14: 120 ug via INTRAVENOUS
  Administered 2019-09-14: 80 ug via INTRAVENOUS
  Administered 2019-09-14: 120 ug via INTRAVENOUS
  Administered 2019-09-14: 80 ug via INTRAVENOUS

## 2019-09-14 MED ORDER — PROPOFOL 10 MG/ML IV BOLUS
INTRAVENOUS | Status: AC
  Filled 2019-09-14: qty 20

## 2019-09-14 MED ORDER — DEXAMETHASONE SODIUM PHOSPHATE 10 MG/ML IJ SOLN
INTRAMUSCULAR | Status: AC
  Filled 2019-09-14: qty 1

## 2019-09-14 MED ORDER — PROPOFOL 10 MG/ML IV BOLUS
INTRAVENOUS | Status: DC | PRN
  Administered 2019-09-14: 200 mg via INTRAVENOUS

## 2019-09-14 MED ORDER — HYDROMORPHONE HCL 1 MG/ML IJ SOLN: 0.2500 mg | INTRAMUSCULAR | Status: DC | PRN

## 2019-09-14 MED ORDER — BUPIVACAINE HCL (PF) 0.25 % IJ SOLN
INTRAMUSCULAR | Status: AC
  Filled 2019-09-14: qty 30

## 2019-09-14 MED ORDER — SALINE SPRAY 0.65 % NA SOLN
1.0000 | NASAL | Status: DC | PRN
  Filled 2019-09-14: qty 44

## 2019-09-14 MED ORDER — MORPHINE SULFATE (PF) 2 MG/ML IV SOLN
2.0000 mg | INTRAVENOUS | Status: DC | PRN
  Administered 2019-09-14 – 2019-09-15 (×2): 2 mg via INTRAVENOUS
  Filled 2019-09-14 (×2): qty 1

## 2019-09-14 MED ORDER — METHOCARBAMOL 1000 MG/10ML IJ SOLN
500.0000 mg | Freq: Four times a day (QID) | INTRAVENOUS | Status: DC | PRN
  Administered 2019-09-15: 500 mg via INTRAVENOUS
  Filled 2019-09-14 (×2): qty 5

## 2019-09-14 MED ORDER — PROMETHAZINE HCL 25 MG/ML IJ SOLN: 6.2500 mg | INTRAMUSCULAR | Status: DC | PRN

## 2019-09-14 MED ORDER — MIDAZOLAM HCL 5 MG/5ML IJ SOLN
INTRAMUSCULAR | Status: DC | PRN
  Administered 2019-09-14: 2 mg via INTRAVENOUS

## 2019-09-14 MED ORDER — OXYCODONE HCL 5 MG PO TABS: 5.0000 mg | ORAL_TABLET | Freq: Once | ORAL | Status: DC | PRN

## 2019-09-14 MED ORDER — CEFAZOLIN SODIUM-DEXTROSE 1-4 GM/50ML-% IV SOLN
1.0000 g | Freq: Three times a day (TID) | INTRAVENOUS | Status: DC
  Administered 2019-09-15 – 2019-09-18 (×11): 1 g via INTRAVENOUS
  Filled 2019-09-14 (×12): qty 50

## 2019-09-14 MED ORDER — MIDAZOLAM HCL 2 MG/2ML IJ SOLN
INTRAMUSCULAR | Status: AC
  Filled 2019-09-14: qty 2

## 2019-09-14 MED ORDER — FENTANYL CITRATE (PF) 100 MCG/2ML IJ SOLN
INTRAMUSCULAR | Status: DC | PRN
  Administered 2019-09-14: 100 ug via INTRAVENOUS

## 2019-09-14 MED ORDER — OXYCODONE HCL 5 MG/5ML PO SOLN: 5.0000 mg | Freq: Once | ORAL | Status: DC | PRN

## 2019-09-14 MED ORDER — EPHEDRINE SULFATE-NACL 50-0.9 MG/10ML-% IV SOSY
PREFILLED_SYRINGE | INTRAVENOUS | Status: DC | PRN
  Administered 2019-09-14: 10 mg via INTRAVENOUS
  Administered 2019-09-14: 15 mg via INTRAVENOUS
  Administered 2019-09-14: 5 mg via INTRAVENOUS

## 2019-09-14 MED ORDER — 0.9 % SODIUM CHLORIDE (POUR BTL) OPTIME
TOPICAL | Status: DC | PRN
  Administered 2019-09-14: 1000 mL

## 2019-09-14 MED ORDER — CLONIDINE HCL (ANALGESIA) 100 MCG/ML EP SOLN
EPIDURAL | Status: DC | PRN
  Administered 2019-09-14: 100 ug

## 2019-09-14 MED ORDER — TRAMADOL HCL 50 MG PO TABS
50.0000 mg | ORAL_TABLET | Freq: Once | ORAL | Status: AC
  Administered 2019-09-14: 50 mg via ORAL
  Filled 2019-09-14: qty 1

## 2019-09-14 MED ORDER — PHENYLEPHRINE HCL-NACL 10-0.9 MG/250ML-% IV SOLN
INTRAVENOUS | Status: DC | PRN
  Administered 2019-09-14: 40 ug/min via INTRAVENOUS
  Administered 2019-09-14: 100 ug/min via INTRAVENOUS

## 2019-09-14 MED ORDER — ONDANSETRON HCL 4 MG/2ML IJ SOLN
INTRAMUSCULAR | Status: AC
  Filled 2019-09-14: qty 2

## 2019-09-14 MED ORDER — ROPIVACAINE HCL 5 MG/ML IJ SOLN
INTRAMUSCULAR | Status: DC | PRN
  Administered 2019-09-14: 30 mL via PERINEURAL

## 2019-09-14 MED ORDER — LACTATED RINGERS IV SOLN: INTRAVENOUS | Status: DC

## 2019-09-14 MED ORDER — LIDOCAINE 2% (20 MG/ML) 5 ML SYRINGE
INTRAMUSCULAR | Status: AC
  Filled 2019-09-14: qty 5

## 2019-09-14 MED ORDER — ONDANSETRON HCL 4 MG/2ML IJ SOLN
INTRAMUSCULAR | Status: DC | PRN
  Administered 2019-09-14: 4 mg via INTRAVENOUS

## 2019-09-14 MED ORDER — CEFAZOLIN SODIUM-DEXTROSE 1-4 GM/50ML-% IV SOLN
1.0000 g | INTRAVENOUS | Status: AC
  Filled 2019-09-14: qty 50

## 2019-09-14 SURGICAL SUPPLY — 74 items
BIT DRILL 110X2.5XQCK CNCT (BIT) IMPLANT
BIT DRILL 2.5 (BIT) ×2
BIT DRILL 2.5X205 (BIT) IMPLANT
BIT DRILL 2.7X100 LONG (BIT) ×1 IMPLANT
BIT DRILL Q-C 2.0 DIA 100 (BIT) ×1 IMPLANT
BIT DRILL STD 2.0MM (DRILL) IMPLANT
BIT DRL 110X2.5XQCK CNCT (BIT) ×1
BLADE CLIPPER SURG (BLADE) IMPLANT
BNDG CMPR 9X4 STRL LF SNTH (GAUZE/BANDAGES/DRESSINGS) ×1
BNDG ELASTIC 3X5.8 VLCR STR LF (GAUZE/BANDAGES/DRESSINGS) ×2 IMPLANT
BNDG ELASTIC 4X5.8 VLCR STR LF (GAUZE/BANDAGES/DRESSINGS) ×2 IMPLANT
BNDG ESMARK 4X9 LF (GAUZE/BANDAGES/DRESSINGS) ×2 IMPLANT
BNDG GAUZE ELAST 4 BULKY (GAUZE/BANDAGES/DRESSINGS) ×2 IMPLANT
CANISTER SUCT 3000ML PPV (MISCELLANEOUS) ×2 IMPLANT
CORD BIPOLAR FORCEPS 12FT (ELECTRODE) ×2 IMPLANT
COVER SURGICAL LIGHT HANDLE (MISCELLANEOUS) ×2 IMPLANT
COVER WAND RF STERILE (DRAPES) ×2 IMPLANT
CUFF TOURN SGL QUICK 18X4 (TOURNIQUET CUFF) ×2 IMPLANT
DECANTER SPIKE VIAL GLASS SM (MISCELLANEOUS) ×2 IMPLANT
DRAPE OEC MINIVIEW 54X84 (DRAPES) ×2 IMPLANT
DRAPE SURG 17X11 SM STRL (DRAPES) ×2 IMPLANT
DRILL BIT 2.5X205 (BIT) ×4
DRILL STANDARD 2.0MM (DRILL) ×2
DRSG ADAPTIC 3X8 NADH LF (GAUZE/BANDAGES/DRESSINGS) ×2 IMPLANT
GAUZE 4X4 16PLY RFD (DISPOSABLE) ×2 IMPLANT
GAUZE SPONGE 4X4 12PLY STRL (GAUZE/BANDAGES/DRESSINGS) ×2 IMPLANT
GAUZE XEROFORM 5X9 LF (GAUZE/BANDAGES/DRESSINGS) ×2 IMPLANT
GLOVE BIOGEL PI IND STRL 7.5 (GLOVE) IMPLANT
GLOVE BIOGEL PI IND STRL 8.5 (GLOVE) ×1 IMPLANT
GLOVE BIOGEL PI INDICATOR 7.5 (GLOVE) ×1
GLOVE BIOGEL PI INDICATOR 8.5 (GLOVE) ×1
GLOVE SURG ORTHO 8.0 STRL STRW (GLOVE) ×1 IMPLANT
GLOVE SURG SS PI 7.0 STRL IVOR (GLOVE) ×1 IMPLANT
GLOVE SURG SYN 8.0 (GLOVE) ×4 IMPLANT
GLOVE SURG SYN 8.0 PF PI (GLOVE) IMPLANT
GOWN STRL REUS W/ TWL LRG LVL3 (GOWN DISPOSABLE) ×1 IMPLANT
GOWN STRL REUS W/ TWL XL LVL3 (GOWN DISPOSABLE) ×1 IMPLANT
GOWN STRL REUS W/TWL LRG LVL3 (GOWN DISPOSABLE) ×2
GOWN STRL REUS W/TWL XL LVL3 (GOWN DISPOSABLE) ×2
KIT BASIN OR (CUSTOM PROCEDURE TRAY) ×2 IMPLANT
KIT TOURNIQUET VASCULAR (MISCELLANEOUS) ×1 IMPLANT
KIT TURNOVER KIT B (KITS) ×2 IMPLANT
NDL HYPO 25X1 1.5 SAFETY (NEEDLE) ×1 IMPLANT
NEEDLE HYPO 25X1 1.5 SAFETY (NEEDLE) ×2 IMPLANT
NS IRRIG 1000ML POUR BTL (IV SOLUTION) ×2 IMPLANT
PACK ORTHO EXTREMITY (CUSTOM PROCEDURE TRAY) ×2 IMPLANT
PAD ARMBOARD 7.5X6 YLW CONV (MISCELLANEOUS) ×4 IMPLANT
PAD CAST 3X4 CTTN HI CHSV (CAST SUPPLIES) IMPLANT
PAD CAST 4YDX4 CTTN HI CHSV (CAST SUPPLIES) ×1 IMPLANT
PADDING CAST COTTON 3X4 STRL (CAST SUPPLIES) ×2
PADDING CAST COTTON 4X4 STRL (CAST SUPPLIES) ×2
PLATE 7 HOLE (Plate) ×1 IMPLANT
PLATE COMP DUAL 7HOLE 2.7 (Plate) ×1 IMPLANT
SCREW 2.7X16MM (Screw) ×6 IMPLANT
SCREW CORTICAL 3.5 16MM (Screw) ×4 IMPLANT
SCREW CORTICAL 3.5 18MM (Screw) ×3 IMPLANT
SCREW LOCK 14X2.7X NS (Screw) IMPLANT
SCREW LOCKING 2.7X12 (Screw) ×2 IMPLANT
SCREW LOCKING 2.7X14 (Screw) ×2 IMPLANT
SCREW NLOCK CORT 2.7X16 NS (Screw) IMPLANT
SOAP 2 % CHG 4 OZ (WOUND CARE) ×2 IMPLANT
SPLINT FIBERGLASS 3X35 (CAST SUPPLIES) ×1 IMPLANT
SPONGE LAP 4X18 RFD (DISPOSABLE) ×2 IMPLANT
SUT PROLENE 3 0 PS 2 (SUTURE) ×5 IMPLANT
SUT PROLENE 4 0 PS 2 18 (SUTURE) IMPLANT
SUT VIC AB 2-0 CT1 27 (SUTURE) ×2
SUT VIC AB 2-0 CT1 TAPERPNT 27 (SUTURE) IMPLANT
SUT VICRYL 4-0 PS2 18IN ABS (SUTURE) ×4 IMPLANT
SYR CONTROL 10ML LL (SYRINGE) IMPLANT
TOWEL GREEN STERILE (TOWEL DISPOSABLE) ×2 IMPLANT
TOWEL GREEN STERILE FF (TOWEL DISPOSABLE) IMPLANT
TUBE CONNECTING 12X1/4 (SUCTIONS) ×2 IMPLANT
WATER STERILE IRR 1000ML POUR (IV SOLUTION) ×2 IMPLANT
YANKAUER SUCT BULB TIP NO VENT (SUCTIONS) ×1 IMPLANT

## 2019-09-14 NOTE — Anesthesia Procedure Notes (Signed)
Procedure Name: LMA Insertion Date/Time: 09/14/2019 11:03 AM Performed by: Jenne Campus, CRNA Pre-anesthesia Checklist: Patient identified, Emergency Drugs available, Suction available and Patient being monitored Patient Re-evaluated:Patient Re-evaluated prior to induction Oxygen Delivery Method: Circle System Utilized Preoxygenation: Pre-oxygenation with 100% oxygen Induction Type: IV induction Ventilation: Mask ventilation without difficulty LMA: LMA inserted LMA Size: 4.0 Number of attempts: 1 Placement Confirmation: positive ETCO2 and breath sounds checked- equal and bilateral Tube secured with: Tape Dental Injury: Teeth and Oropharynx as per pre-operative assessment

## 2019-09-14 NOTE — Anesthesia Procedure Notes (Signed)
Anesthesia Regional Block: Supraclavicular block   Pre-Anesthetic Checklist: ,, timeout performed, Correct Patient, Correct Site, Correct Laterality, Correct Procedure, Correct Position, site marked, Risks and benefits discussed,  Surgical consent,  Pre-op evaluation,  At surgeon's request and post-op pain management  Laterality: Left  Prep: chloraprep       Needles:  Injection technique: Single-shot  Needle Type: Echogenic Stimulator Needle     Needle Length: 9cm  Needle Gauge: 21     Additional Needles:   Procedures:,,,, ultrasound used (permanent image in chart),,,,  Narrative:  Start time: 09/14/2019 10:50 AM End time: 09/14/2019 11:00 AM Injection made incrementally with aspirations every 5 mL.  Performed by: Personally  Anesthesiologist: Murvin Natal, MD  Additional Notes: Functioning IV was confirmed and monitors were applied.  A timeout was performed. Sterile prep, hand hygiene and sterile gloves were used. A 54mm 21ga Arrow echogenic stimulator needle was used. Negative aspiration and negative test dose prior to incremental administration of local anesthetic. The patient tolerated the procedure well.  Ultrasound guidance: relevent anatomy identified, needle position confirmed, local anesthetic spread visualized around nerve(s), vascular puncture avoided.  Image printed for medical record.
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
## 2019-09-14 NOTE — Anesthesia Postprocedure Evaluation (Signed)
Anesthesia Post Note  Patient: Teresa [name]  Procedure(s) Performed: OPEN REDUCTION INTERNAL FIXATION (ORIF)  RADIAL AND ULNA FRACTURE (Right Arm Lower)     Patient location during evaluation: PACU Anesthesia Type: Regional and General Level of consciousness: awake and alert Pain management: pain level controlled Vital Signs Assessment: post-procedure vital signs reviewed and stable Respiratory status: spontaneous breathing, nonlabored ventilation, respiratory function stable and patient connected to nasal cannula oxygen Cardiovascular status: blood pressure returned to baseline and stable Postop Assessment: no apparent nausea or vomiting Anesthetic complications: no    Last Vitals:  Vitals:   09/14/19 1415 09/14/19 1430  BP: 110/73 116/69  Pulse: 73 80  Resp: 14 19  Temp:    SpO2: 99% 97%    Last Pain:  Vitals:   09/14/19 1430  TempSrc:   PainSc: 0-No pain                 [name]

## 2019-09-14 NOTE — Transfer of Care (Signed)
Immediate Anesthesia Transfer of Care Note  Patient: Lorelei Neidhardt  Procedure(s) Performed: OPEN REDUCTION INTERNAL FIXATION (ORIF)  RADIAL AND ULNA FRACTURE (Right Arm Lower)  Patient Location: PACU  Anesthesia Type:General  Level of Consciousness: oriented, sedated and patient cooperative  Airway & Oxygen Therapy: Patient Spontanous Breathing and Patient connected to nasal cannula oxygen  Post-op Assessment: Report given to RN and Post -op Vital signs reviewed and stable  Post vital signs: Reviewed  Last Vitals:  Vitals Value Taken Time  BP 107/66 09/14/19 1401  Temp    Pulse 72 09/14/19 1402  Resp 14 09/14/19 1402  SpO2 96 % 09/14/19 1402  Vitals shown include unvalidated device data.  Last Pain:  Vitals:   09/14/19 0754  TempSrc: Oral  PainSc:       Patients Stated Pain Goal: 0 [id number] [phone])  Complications: No apparent anesthesia complications

## 2019-09-14 NOTE — Discharge Instructions (Signed)
KEEP BANDAGE CLEAN AND DRY CALL OFFICE FOR F/U APPT [phone] in 2 weeks HOME ON PAIN MEDICATION AND ORAL ANTIBIOTICS KEEP HAND ELEVATED ABOVE HEART OK TO APPLY ICE TO OPERATIVE AREA CONTACT OFFICE IF ANY WORSENING PAIN OR CONCERNS.
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
## 2019-09-14 NOTE — Progress Notes (Signed)
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 vessels.  No unexpected calcification Skull: Normal. Negative for fracture or focal lesion. Sinuses/Orbits: No acute finding. Other: None CT CERVICAL SPINE FINDINGS Alignment: Straightening of the cervical spine. Facet alignment is maintained Skull base and vertebrae: No acute fracture. No primary bone lesion or focal pathologic process. Soft tissues and spinal canal: No prevertebral fluid or swelling. No visible canal hematoma. Disc levels: Disc spaces are grossly maintained. Small anterior osteophytes at C6-C7 and C7-T1. Upper chest: Apical emphysema Other: None IMPRESSION: 1. Negative non contrasted CT appearance of the brain 2. Straightening of the cervical spine. No acute osseous abnormality Electronically Signed   By: Donavan Foil M.D.   On: 09/13/2019 18:47        Scheduled Meds: . carvedilol  12.5 mg Oral BID WC  . hydrALAZINE  50 mg Oral QID  . levothyroxine  125 mcg Oral Daily  . ondansetron (ZOFRAN) IV  4 mg Intravenous Q6H  .  spironolactone  50 mg Oral Daily  . Tdap  0.5 mL Intramuscular Once   Continuous Infusions: .  ceFAZolin (ANCEF) IV       LOS: 1 day    Time spent:0 min    Nickalas Mccarrick, Geraldo Docker, MD [facility] Pager [phone]  If 7PM-7AM, please contact night-coverage [email or web address] Password [email or web address] 09/14/2019, 8:00 AM

## 2019-09-14 NOTE — Anesthesia Preprocedure Evaluation (Addendum)
Anesthesia Evaluation  Patient identified by MRN, date of birth, ID band Patient awake    Reviewed: Allergy & Precautions, NPO status , Patient's Chart, lab work & pertinent test results  Airway Mallampati: II  TM Distance: >3 FB Neck ROM: Full    Dental no notable dental hx. (+) Teeth Intact, Dental Advisory Given, Chipped,    Pulmonary former smoker,    Pulmonary exam normal breath sounds clear to auscultation       Cardiovascular hypertension, Pt. on home beta blockers and Pt. on medications +CHF  Normal cardiovascular exam Rhythm:Regular Rate:Normal     Neuro/Psych  Headaches, CVA, No Residual Symptoms negative psych ROS   GI/Hepatic Neg liver ROS, hiatal hernia,   Endo/Other  Hypothyroidism   Renal/GU negative Renal ROS     Musculoskeletal Chronic lower back pain   Abdominal (+) + obese,   Peds  Hematology negative hematology ROS (+)   Anesthesia Other Findings RIGHT RADIUS AND ULNA FRACTURE  Reproductive/Obstetrics                            Anesthesia Physical Anesthesia Plan  ASA: III  Anesthesia Plan: General and Regional   Post-op Pain Management: GA combined w/ Regional for post-op pain   Induction: Intravenous  PONV Risk Score and Plan: 3 and Ondansetron, Dexamethasone, Midazolam and Treatment may vary due to age or medical condition  Airway Management Planned: LMA  Additional Equipment:   Intra-op Plan:   Post-operative Plan: Extubation in OR  Informed Consent: I have reviewed the patients History and Physical, chart, labs and discussed the procedure including the risks, benefits and alternatives for the proposed anesthesia with the patient or authorized representative who has indicated his/her understanding and acceptance.     Dental advisory given  Plan Discussed with: CRNA  Anesthesia Plan Comments: (Motor and sensation intact to right hand)         Anesthesia Quick Evaluation

## 2019-09-15 DIAGNOSIS — I10 Essential (primary) hypertension: Secondary | ICD-10-CM

## 2019-09-15 DIAGNOSIS — S5291XA Unspecified fracture of right forearm, initial encounter for closed fracture: Secondary | ICD-10-CM

## 2019-09-15 DIAGNOSIS — I5032 Chronic diastolic (congestive) heart failure: Secondary | ICD-10-CM

## 2019-09-15 DIAGNOSIS — Z8673 Personal history of transient ischemic attack (TIA), and cerebral infarction without residual deficits: Secondary | ICD-10-CM

## 2019-09-15 DIAGNOSIS — E038 Other specified hypothyroidism: Secondary | ICD-10-CM

## 2019-09-15 DIAGNOSIS — Z6836 Body mass index (BMI) 36.0-36.9, adult: Secondary | ICD-10-CM

## 2019-09-15 DIAGNOSIS — S5291XC Unspecified fracture of right forearm, initial encounter for open fracture type IIIA, IIIB, or IIIC: Secondary | ICD-10-CM

## 2019-09-15 DIAGNOSIS — E662 Morbid (severe) obesity with alveolar hypoventilation: Secondary | ICD-10-CM

## 2019-09-15 DIAGNOSIS — E7800 Pure hypercholesterolemia, unspecified: Secondary | ICD-10-CM

## 2019-09-15 LAB — PHOSPHORUS: Phosphorus: 3.6 mg/dL (ref 2.5–4.6)

## 2019-09-15 LAB — COMPREHENSIVE METABOLIC PANEL
ALT: 20 U/L (ref 0–44)
AST: 29 U/L (ref 15–41)
Albumin: 3.8 g/dL (ref 3.5–5.0)
Alkaline Phosphatase: 75 U/L (ref 38–126)
Anion gap: 7 (ref 5–15)
BUN: 19 mg/dL (ref 6–20)
CO2: 24 mmol/L (ref 22–32)
Calcium: 9 mg/dL (ref 8.9–10.3)
Chloride: 106 mmol/L (ref 98–111)
Creatinine, Ser: 1.21 mg/dL — ABNORMAL HIGH (ref 0.44–1.00)
GFR calc Af Amer: 58 mL/min — ABNORMAL LOW (ref >60)
GFR calc non Af Amer: 50 mL/min — ABNORMAL LOW (ref >60)
Glucose, Bld: 115 mg/dL — ABNORMAL HIGH (ref 70–99)
Potassium: 4.4 mmol/L (ref 3.5–5.1)
Sodium: 137 mmol/L (ref 135–145)
Total Bilirubin: 0.6 mg/dL (ref 0.3–1.2)
Total Protein: 7.1 g/dL (ref 6.5–8.1)

## 2019-09-15 LAB — CBC WITH DIFFERENTIAL/PLATELET
Abs Immature Granulocytes: 0.07 10*3/uL (ref 0.00–0.07)
Basophils Absolute: 0 10*3/uL (ref 0.0–0.1)
Basophils Relative: 0 %
Eosinophils Absolute: 0 10*3/uL (ref 0.0–0.5)
Eosinophils Relative: 0 %
HCT: 38.4 % (ref 36.0–46.0)
Hemoglobin: 12.2 g/dL (ref 12.0–15.0)
Immature Granulocytes: 1 %
Lymphocytes Relative: 15 %
Lymphs Abs: 2.2 10*3/uL (ref 0.7–4.0)
MCH: 26.2 pg (ref 26.0–34.0)
MCHC: 31.8 g/dL (ref 30.0–36.0)
MCV: 82.4 fL (ref 80.0–100.0)
Monocytes Absolute: 1.3 10*3/uL — ABNORMAL HIGH (ref 0.1–1.0)
Monocytes Relative: 9 %
Neutro Abs: 11.4 10*3/uL — ABNORMAL HIGH (ref 1.7–7.7)
Neutrophils Relative %: 75 %
Platelets: 352 10*3/uL (ref 150–400)
RBC: 4.66 MIL/uL (ref 3.87–5.11)
RDW: 13.7 % (ref 11.5–15.5)
WBC: 15.1 10*3/uL — ABNORMAL HIGH (ref 4.0–10.5)
nRBC: 0 % (ref 0.0–0.2)

## 2019-09-15 LAB — MAGNESIUM: Magnesium: 2 mg/dL (ref 1.7–2.4)

## 2019-09-15 MED ORDER — HYDROMORPHONE HCL 1 MG/ML IJ SOLN
1.0000 mg | Freq: Once | INTRAMUSCULAR | Status: AC
  Administered 2019-09-15: 1 mg via INTRAVENOUS
  Filled 2019-09-15: qty 1

## 2019-09-15 MED ORDER — METHOCARBAMOL 1000 MG/10ML IJ SOLN
500.0000 mg | INTRAVENOUS | Status: DC
  Administered 2019-09-15 – 2019-09-18 (×15): 500 mg via INTRAVENOUS
  Filled 2019-09-15: qty 5
  Filled 2019-09-15: qty 500
  Filled 2019-09-15 (×2): qty 5
  Filled 2019-09-15: qty 500
  Filled 2019-09-15: qty 5
  Filled 2019-09-15: qty 500
  Filled 2019-09-15 (×2): qty 5
  Filled 2019-09-15: qty 500
  Filled 2019-09-15: qty 5
  Filled 2019-09-15: qty 500
  Filled 2019-09-15 (×8): qty 5

## 2019-09-15 MED ORDER — TRAMADOL HCL 50 MG PO TABS
100.0000 mg | ORAL_TABLET | Freq: Four times a day (QID) | ORAL | Status: DC
  Administered 2019-09-15 (×2): 100 mg via ORAL
  Filled 2019-09-15 (×2): qty 2

## 2019-09-15 MED ORDER — BISACODYL 5 MG PO TBEC
5.0000 mg | DELAYED_RELEASE_TABLET | Freq: Every day | ORAL | Status: DC
  Administered 2019-09-16 – 2019-09-19 (×4): 5 mg via ORAL
  Filled 2019-09-15 (×4): qty 1

## 2019-09-15 MED ORDER — POLYETHYLENE GLYCOL 3350 17 G PO PACK
17.0000 g | PACK | Freq: Every day | ORAL | Status: DC
  Administered 2019-09-15 – 2019-09-19 (×5): 17 g via ORAL
  Filled 2019-09-15 (×5): qty 1

## 2019-09-15 MED ORDER — METHOCARBAMOL 1000 MG/10ML IJ SOLN
500.0000 mg | INTRAVENOUS | Status: DC
  Administered 2019-09-15: 500 mg via INTRAVENOUS
  Filled 2019-09-15 (×5): qty 5

## 2019-09-15 MED ORDER — METHOCARBAMOL 1000 MG/10ML IJ SOLN
500.0000 mg | INTRAVENOUS | Status: DC | PRN
  Administered 2019-09-15: 500 mg via INTRAVENOUS
  Filled 2019-09-15: qty 500
  Filled 2019-09-15: qty 5

## 2019-09-15 MED ORDER — LIP MEDEX EX OINT
TOPICAL_OINTMENT | CUTANEOUS | Status: DC | PRN
  Filled 2019-09-15: qty 7

## 2019-09-15 MED ORDER — MORPHINE SULFATE (PF) 2 MG/ML IV SOLN
2.0000 mg | INTRAVENOUS | Status: DC | PRN
  Administered 2019-09-15: 4 mg via INTRAVENOUS
  Administered 2019-09-17 (×2): 2 mg via INTRAVENOUS
  Administered 2019-09-18 (×2): 4 mg via INTRAVENOUS
  Filled 2019-09-15 (×2): qty 1
  Filled 2019-09-15 (×3): qty 2

## 2019-09-15 MED ORDER — MORPHINE SULFATE (PF) 4 MG/ML IV SOLN
4.0000 mg | Freq: Once | INTRAVENOUS | Status: AC
  Administered 2019-09-15: 4 mg via INTRAVENOUS
  Filled 2019-09-15: qty 1

## 2019-09-15 MED ORDER — POLYVINYL ALCOHOL 1.4 % OP SOLN
1.0000 [drp] | OPHTHALMIC | Status: DC | PRN
  Filled 2019-09-15: qty 15

## 2019-09-15 MED ORDER — PHENOL 1.4 % MT LIQD
1.0000 | OROMUCOSAL | Status: DC | PRN
  Administered 2019-09-15: 1 via OROMUCOSAL
  Filled 2019-09-15: qty 177

## 2019-09-15 MED ORDER — TRAMADOL HCL 50 MG PO TABS
100.0000 mg | ORAL_TABLET | Freq: Four times a day (QID) | ORAL | Status: DC
  Administered 2019-09-15 – 2019-09-19 (×15): 100 mg via ORAL
  Filled 2019-09-15 (×15): qty 2

## 2019-09-15 MED ORDER — HYDROMORPHONE HCL 1 MG/ML IJ SOLN
2.0000 mg | Freq: Four times a day (QID) | INTRAMUSCULAR | Status: DC
  Administered 2019-09-15 – 2019-09-18 (×12): 2 mg via INTRAVENOUS
  Filled 2019-09-15 (×12): qty 2
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
## 2019-09-15 NOTE — Progress Notes (Signed)
Pt cont to have severe pain in r arm. Dr. Sherral Hammers notified. New orders placed. Dr. Ambrose Finland paged to notify of increasing pain post surgery without pain control from pain meds. Cont to monitor. Carroll Kinds RN

## 2019-09-15 NOTE — Progress Notes (Signed)
The patient was seen and examined today. The patient is still having pain has the regional block is worn off. Would recommend continuing with the IV pain medicine Dilaudid seemed to been working better think this is very reasonable given her injury and surgery. We went over strict elevation Ice therapy Okay to be discharged once pain under better oral control. She will need to see me back in the office in approximately 2 weeks.

## 2019-09-16 LAB — GLUCOSE, CAPILLARY: Glucose-Capillary: 99 mg/dL (ref 70–99)

## 2019-09-16 MED ORDER — HYDRALAZINE HCL 50 MG PO TABS
75.0000 mg | ORAL_TABLET | Freq: Four times a day (QID) | ORAL | Status: DC
  Administered 2019-09-16 – 2019-09-19 (×11): 75 mg via ORAL
  Filled 2019-09-16 (×11): qty 1

## 2019-09-16 MED ORDER — ISOSORBIDE MONONITRATE ER 30 MG PO TB24
30.0000 mg | ORAL_TABLET | Freq: Every day | ORAL | Status: DC
  Administered 2019-09-16 – 2019-09-19 (×4): 30 mg via ORAL
  Filled 2019-09-16 (×4): qty 1
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
## 2019-09-17 ENCOUNTER — Other Ambulatory Visit: Payer: Self-pay

## 2019-09-17 ENCOUNTER — Encounter (HOSPITAL_COMMUNITY): Payer: Self-pay | Admitting: Internal Medicine

## 2019-09-17 ENCOUNTER — Ambulatory Visit: Payer: 59 | Admitting: Family Medicine

## 2019-09-17 DIAGNOSIS — Z720 Tobacco use: Secondary | ICD-10-CM

## 2019-09-17 LAB — CBC WITH DIFFERENTIAL/PLATELET
Abs Immature Granulocytes: 0.04 10*3/uL (ref 0.00–0.07)
Basophils Absolute: 0.1 10*3/uL (ref 0.0–0.1)
Basophils Relative: 1 %
Eosinophils Absolute: 0.3 10*3/uL (ref 0.0–0.5)
Eosinophils Relative: 4 %
HCT: 37.3 % (ref 36.0–46.0)
Hemoglobin: 11.6 g/dL — ABNORMAL LOW (ref 12.0–15.0)
Immature Granulocytes: 1 %
Lymphocytes Relative: 32 %
Lymphs Abs: 2.5 10*3/uL (ref 0.7–4.0)
MCH: 26.2 pg (ref 26.0–34.0)
MCHC: 31.1 g/dL (ref 30.0–36.0)
MCV: 84.4 fL (ref 80.0–100.0)
Monocytes Absolute: 0.6 10*3/uL (ref 0.1–1.0)
Monocytes Relative: 7 %
Neutro Abs: 4.4 10*3/uL (ref 1.7–7.7)
Neutrophils Relative %: 55 %
Platelets: 332 10*3/uL (ref 150–400)
RBC: 4.42 MIL/uL (ref 3.87–5.11)
RDW: 13.6 % (ref 11.5–15.5)
WBC: 7.8 10*3/uL (ref 4.0–10.5)
nRBC: 0 % (ref 0.0–0.2)

## 2019-09-17 LAB — BASIC METABOLIC PANEL
Anion gap: 11 (ref 5–15)
BUN: 12 mg/dL (ref 6–20)
CO2: 25 mmol/L (ref 22–32)
Calcium: 9.2 mg/dL (ref 8.9–10.3)
Chloride: 98 mmol/L (ref 98–111)
Creatinine, Ser: 1 mg/dL (ref 0.44–1.00)
GFR calc Af Amer: >60 mL/min (ref >60)
GFR calc non Af Amer: >60 mL/min (ref >60)
Glucose, Bld: 96 mg/dL (ref 70–99)
Potassium: 4.7 mmol/L (ref 3.5–5.1)
Sodium: 134 mmol/L — ABNORMAL LOW (ref 135–145)

## 2019-09-17 LAB — PHOSPHORUS: Phosphorus: 4.3 mg/dL (ref 2.5–4.6)

## 2019-09-17 LAB — MAGNESIUM: Magnesium: 1.9 mg/dL (ref 1.7–2.4)

## 2019-09-17 MED ORDER — SORBITOL 70 % SOLN
960.0000 mL | TOPICAL_OIL | Freq: Once | ORAL | Status: DC
  Filled 2019-09-17: qty 473
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
## 2019-09-18 LAB — COMPREHENSIVE METABOLIC PANEL
ALT: 30 U/L (ref 0–44)
AST: 35 U/L (ref 15–41)
Albumin: 3.4 g/dL — ABNORMAL LOW (ref 3.5–5.0)
Alkaline Phosphatase: 70 U/L (ref 38–126)
Anion gap: 9 (ref 5–15)
BUN: 12 mg/dL (ref 6–20)
CO2: 28 mmol/L (ref 22–32)
Calcium: 9.3 mg/dL (ref 8.9–10.3)
Chloride: 97 mmol/L — ABNORMAL LOW (ref 98–111)
Creatinine, Ser: 1.06 mg/dL — ABNORMAL HIGH (ref 0.44–1.00)
GFR calc Af Amer: >60 mL/min (ref >60)
GFR calc non Af Amer: 58 mL/min — ABNORMAL LOW (ref >60)
Glucose, Bld: 118 mg/dL — ABNORMAL HIGH (ref 70–99)
Potassium: 4.2 mmol/L (ref 3.5–5.1)
Sodium: 134 mmol/L — ABNORMAL LOW (ref 135–145)
Total Bilirubin: 0.5 mg/dL (ref 0.3–1.2)
Total Protein: 6.5 g/dL (ref 6.5–8.1)

## 2019-09-18 LAB — CBC
HCT: 35.5 % — ABNORMAL LOW (ref 36.0–46.0)
Hemoglobin: 11.1 g/dL — ABNORMAL LOW (ref 12.0–15.0)
MCH: 26.2 pg (ref 26.0–34.0)
MCHC: 31.3 g/dL (ref 30.0–36.0)
MCV: 83.7 fL (ref 80.0–100.0)
Platelets: 327 10*3/uL (ref 150–400)
RBC: 4.24 MIL/uL (ref 3.87–5.11)
RDW: 13.5 % (ref 11.5–15.5)
WBC: 8.7 10*3/uL (ref 4.0–10.5)
nRBC: 0 % (ref 0.0–0.2)

## 2019-09-18 LAB — PHOSPHORUS: Phosphorus: 3.6 mg/dL (ref 2.5–4.6)

## 2019-09-18 LAB — MAGNESIUM: Magnesium: 1.7 mg/dL (ref 1.7–2.4)

## 2019-09-18 MED ORDER — CEPHALEXIN 500 MG PO CAPS
500.0000 mg | ORAL_CAPSULE | Freq: Four times a day (QID) | ORAL | Status: DC
  Administered 2019-09-18 – 2019-09-19 (×4): 500 mg via ORAL
  Filled 2019-09-18 (×4): qty 1

## 2019-09-18 MED ORDER — METHOCARBAMOL 500 MG PO TABS
500.0000 mg | ORAL_TABLET | Freq: Four times a day (QID) | ORAL | Status: DC | PRN
  Administered 2019-09-18 – 2019-09-19 (×3): 500 mg via ORAL
  Filled 2019-09-18 (×3): qty 1

## 2019-09-18 MED ORDER — HYDROMORPHONE HCL 1 MG/ML IJ SOLN
1.0000 mg | INTRAMUSCULAR | Status: DC | PRN
  Administered 2019-09-18 (×3): 1 mg via INTRAVENOUS
  Filled 2019-09-18 (×3): qty 1

## 2019-09-18 MED ORDER — HYDROCODONE-ACETAMINOPHEN 7.5-325 MG PO TABS
1.0000 | ORAL_TABLET | ORAL | Status: DC | PRN
  Administered 2019-09-18 – 2019-09-19 (×4): 1 via ORAL
  Filled 2019-09-18 (×4): qty 1

## 2019-09-18 NOTE — Progress Notes (Signed)
  Mobility Specialist Criteria Algorithm Info. Mobility Team:  [facility] elevated:Self regulated Activity: Ambulated in hall;Dangled on edge of bed Range of motion: Active;All extremities Level of assistance: Independent Assistive device: None Minutes sitting in chair:  Minutes stood: 5 minutes Minutes ambulated: 5 minutes Distance ambulated (ft): 500 ft Mobility response: Tolerated well Bed Position: Semi-fowlers (Dangle EOB)  Pre-ambulation: HR 71, BP 162/79 Post-ambulation: HR 85, BP 170/80   09/18/2019 2:49 PM
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
## 2019-09-19 MED ORDER — HYDROCODONE-ACETAMINOPHEN 5-325 MG PO TABS
1.0000 | ORAL_TABLET | ORAL | Status: DC | PRN
  Administered 2019-09-19: 1 via ORAL
  Filled 2019-09-19: qty 1

## 2019-09-19 MED ORDER — CEPHALEXIN 500 MG PO CAPS: 500.0000 mg | ORAL_CAPSULE | Freq: Four times a day (QID) | ORAL | 0 refills | Status: AC

## 2019-09-19 MED ORDER — HYDRALAZINE HCL 25 MG PO TABS: 75.0000 mg | ORAL_TABLET | Freq: Four times a day (QID) | ORAL | 0 refills | Status: DC

## 2019-09-19 MED ORDER — CEPHALEXIN 500 MG PO CAPS: 500.0000 mg | ORAL_CAPSULE | Freq: Four times a day (QID) | ORAL | 0 refills | Status: DC

## 2019-09-19 MED ORDER — ISOSORBIDE MONONITRATE ER 30 MG PO TB24: 30.0000 mg | ORAL_TABLET | Freq: Every day | ORAL | 0 refills | Status: DC

## 2019-09-19 MED ORDER — TIZANIDINE HCL 2 MG PO TABS: 2.0000 mg | ORAL_TABLET | Freq: Four times a day (QID) | ORAL | 0 refills | Status: DC | PRN

## 2019-09-19 MED ORDER — HYDROCODONE-ACETAMINOPHEN 5-325 MG PO TABS: 1.0000 | ORAL_TABLET | Freq: Four times a day (QID) | ORAL | 0 refills | Status: DC | PRN
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
## 2019-09-24 ENCOUNTER — Encounter: Payer: Self-pay | Admitting: Family Medicine

## 2019-09-24 ENCOUNTER — Other Ambulatory Visit: Payer: Self-pay

## 2019-09-24 ENCOUNTER — Ambulatory Visit: Payer: 59 | Admitting: Family Medicine

## 2019-09-24 VITALS — BP 179/80 | HR 73 | Ht 65.0 in | Wt 203.4 lb

## 2019-09-24 DIAGNOSIS — D649 Anemia, unspecified: Secondary | ICD-10-CM | POA: Diagnosis not present

## 2019-09-24 DIAGNOSIS — E038 Other specified hypothyroidism: Secondary | ICD-10-CM

## 2019-09-24 DIAGNOSIS — N179 Acute kidney failure, unspecified: Secondary | ICD-10-CM

## 2019-09-24 DIAGNOSIS — I10 Essential (primary) hypertension: Secondary | ICD-10-CM | POA: Diagnosis not present

## 2019-09-24 DIAGNOSIS — S5291XA Unspecified fracture of right forearm, initial encounter for closed fracture: Secondary | ICD-10-CM

## 2019-09-24 MED ORDER — HYDROCODONE-ACETAMINOPHEN 5-325 MG PO TABS: 1.0000 | ORAL_TABLET | Freq: Three times a day (TID) | ORAL | 0 refills | Status: AC

## 2019-09-24 MED ORDER — LEVOTHYROXINE SODIUM 150 MCG PO TABS: 150.0000 ug | ORAL_TABLET | ORAL | 2 refills | Status: DC

## 2019-09-24 MED ORDER — IBUPROFEN 400 MG PO TABS: 400.0000 mg | ORAL_TABLET | Freq: Three times a day (TID) | ORAL | 0 refills | Status: AC | PRN

## 2019-09-24 NOTE — Assessment & Plan Note (Signed)
Resent TSH elevated. As discussed, I recommended increasing her Synthroid to 150 mcg qd. She is about to finish her current dose of 125 mcg qd. I went ahead and refilled her Synthroid to reflect change in her dose. Recheck lab in about 4-8 weeks.  She agreed with the plan.

## 2019-09-24 NOTE — Patient Instructions (Addendum)
It was nice seeing you today. Please increase your Coreg to 25 mg BID. Do not start Imdur. Check your BP and HR closely at home. Keep your HR above 55 if possible. If low or symptomatic, please hold your Coreg and call me immediately. I will see you back in 2 weeks.  Call your orthopedic today for follow-up as planned.

## 2019-09-24 NOTE — Assessment & Plan Note (Addendum)
Xray reviewed. Hospital discharge summary reviewed. I refilled her Vicodin for a few more days and advised her that this is not a chronic medications and expected to d/c all together in about a week. I recommended that she takes Ibuprofen 400 mg first and Vicodin only for breakthrough pain. She will call her orthopedic's office today for her symptoms to check if she need to come in sooner than later. F/U as planned. [place] PMP aware reviewed

## 2019-09-24 NOTE — Assessment & Plan Note (Addendum)
BP remains elevated. I advised that she holds Imdur for now. Increase Coreg to 25 mg bid. She denies any s/e to Coreg. Continue current dose of Aldactone and Hydralazine. Bmet checked. Home BP and HR monitoring instruction given. F/U in 2 weeks for reassessment.
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
## 2019-09-25 LAB — BASIC METABOLIC PANEL
BUN/Creatinine Ratio: 16 (ref 9–23)
BUN: 16 mg/dL (ref 6–24)
CO2: 20 mmol/L (ref 20–29)
Calcium: 10 mg/dL (ref 8.7–10.2)
Chloride: 106 mmol/L (ref 96–106)
Creatinine, Ser: 1.02 mg/dL — ABNORMAL HIGH (ref 0.57–1.00)
GFR calc Af Amer: 71 mL/min/{1.73_m2} (ref >59)
GFR calc non Af Amer: 61 mL/min/{1.73_m2} (ref >59)
Glucose: 109 mg/dL — ABNORMAL HIGH (ref 65–99)
Potassium: 4.3 mmol/L (ref 3.5–5.2)
Sodium: 142 mmol/L (ref 134–144)

## 2019-09-25 LAB — CBC
Hematocrit: 41.1 % (ref 34.0–46.6)
Hemoglobin: 13.1 g/dL (ref 11.1–15.9)
MCH: 25.9 pg — ABNORMAL LOW (ref 26.6–33.0)
MCHC: 31.9 g/dL (ref 31.5–35.7)
MCV: 81 fL (ref 79–97)
Platelets: 390 10*3/uL (ref 150–450)
RBC: 5.06 x10E6/uL (ref 3.77–5.28)
RDW: 13.4 % (ref 11.7–15.4)
WBC: 8.3 10*3/uL (ref 3.4–10.8)

## 2019-09-29 ENCOUNTER — Encounter: Payer: Self-pay | Admitting: Family Medicine

## 2019-09-30 ENCOUNTER — Other Ambulatory Visit: Payer: Self-pay | Admitting: Family Medicine

## 2019-09-30 MED ORDER — FLUCONAZOLE 150 MG PO TABS: 150.0000 mg | ORAL_TABLET | Freq: Once | ORAL | 0 refills | Status: AC

## 2019-10-07 ENCOUNTER — Ambulatory Visit (INDEPENDENT_AMBULATORY_CARE_PROVIDER_SITE_OTHER): Payer: 59 | Admitting: *Deleted

## 2019-10-07 DIAGNOSIS — Z8673 Personal history of transient ischemic attack (TIA), and cerebral infarction without residual deficits: Secondary | ICD-10-CM

## 2019-10-07 LAB — CUP PACEART REMOTE DEVICE CHECK
Date Time Interrogation Session: 20210411033640
Implantable Pulse Generator Implant Date: 20190325

## 2019-10-08 NOTE — Progress Notes (Signed)
ILR Remote 

## 2019-10-16 ENCOUNTER — Encounter: Payer: Self-pay | Admitting: Family Medicine

## 2019-10-18 ENCOUNTER — Encounter (INDEPENDENT_AMBULATORY_CARE_PROVIDER_SITE_OTHER): Payer: Self-pay | Admitting: Otolaryngology

## 2019-10-18 ENCOUNTER — Ambulatory Visit (INDEPENDENT_AMBULATORY_CARE_PROVIDER_SITE_OTHER): Payer: 59 | Admitting: Otolaryngology

## 2019-10-18 ENCOUNTER — Other Ambulatory Visit: Payer: Self-pay

## 2019-10-18 VITALS — Temp 97.2°F

## 2019-10-18 DIAGNOSIS — H6123 Impacted cerumen, bilateral: Secondary | ICD-10-CM

## 2019-10-18 DIAGNOSIS — J310 Chronic rhinitis: Secondary | ICD-10-CM | POA: Diagnosis not present
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
## 2019-10-19 ENCOUNTER — Encounter: Payer: Self-pay | Admitting: Family Medicine

## 2019-10-22 ENCOUNTER — Other Ambulatory Visit: Payer: Self-pay | Admitting: Family Medicine

## 2019-10-22 MED ORDER — PROMETHAZINE HCL 12.5 MG PO TABS: 12.5000 mg | ORAL_TABLET | Freq: Three times a day (TID) | ORAL | 0 refills | Status: DC | PRN

## 2019-11-07 LAB — CUP PACEART REMOTE DEVICE CHECK
Date Time Interrogation Session: 20210512033506
Implantable Pulse Generator Implant Date: 20190325

## 2019-11-11 ENCOUNTER — Ambulatory Visit (INDEPENDENT_AMBULATORY_CARE_PROVIDER_SITE_OTHER): Payer: 59 | Admitting: *Deleted

## 2019-11-11 DIAGNOSIS — I5032 Chronic diastolic (congestive) heart failure: Secondary | ICD-10-CM

## 2019-11-12 NOTE — Progress Notes (Signed)
[facility] Summary Report / Loop Recorder 

## 2019-12-02 ENCOUNTER — Encounter: Payer: Self-pay | Admitting: Family Medicine

## 2019-12-05 ENCOUNTER — Telehealth: Payer: Self-pay | Admitting: Family Medicine

## 2019-12-05 NOTE — Telephone Encounter (Signed)
I received a form from her daughter's job for [employer] request. I called to inform her that since her daughter is not my patient, I am unable to complete this form on her behalf.  She will have her daughter return to our clinic to pick up the form.  Form eft in the RN's box

## 2019-12-06 NOTE — Telephone Encounter (Signed)
Form placed up front for pick up.

## 2019-12-16 ENCOUNTER — Ambulatory Visit (INDEPENDENT_AMBULATORY_CARE_PROVIDER_SITE_OTHER): Payer: 59 | Admitting: *Deleted

## 2019-12-16 DIAGNOSIS — I639 Cerebral infarction, unspecified: Secondary | ICD-10-CM | POA: Diagnosis not present

## 2019-12-16 LAB — CUP PACEART REMOTE DEVICE CHECK
Date Time Interrogation Session: 20210621004813
Implantable Pulse Generator Implant Date: 20190325

## 2019-12-17 NOTE — Progress Notes (Signed)
[facility] Summary Report / Loop Recorder 

## 2020-01-14 ENCOUNTER — Encounter: Payer: Self-pay | Admitting: Family Medicine

## 2020-01-14 ENCOUNTER — Other Ambulatory Visit: Payer: Self-pay

## 2020-01-14 ENCOUNTER — Ambulatory Visit (INDEPENDENT_AMBULATORY_CARE_PROVIDER_SITE_OTHER): Payer: 59 | Admitting: Family Medicine

## 2020-01-14 VITALS — BP 162/72 | HR 69 | Ht 65.0 in | Wt 209.2 lb

## 2020-01-14 DIAGNOSIS — E041 Nontoxic single thyroid nodule: Secondary | ICD-10-CM

## 2020-01-14 DIAGNOSIS — E7800 Pure hypercholesterolemia, unspecified: Secondary | ICD-10-CM

## 2020-01-14 DIAGNOSIS — I10 Essential (primary) hypertension: Secondary | ICD-10-CM | POA: Diagnosis not present

## 2020-01-14 DIAGNOSIS — E039 Hypothyroidism, unspecified: Secondary | ICD-10-CM | POA: Diagnosis not present

## 2020-01-14 DIAGNOSIS — M542 Cervicalgia: Secondary | ICD-10-CM

## 2020-01-14 MED ORDER — HYDRALAZINE HCL 100 MG PO TABS
100.0000 mg | ORAL_TABLET | Freq: Three times a day (TID) | ORAL | 0 refills | Status: DC
Start: 2020-01-14 — End: 2020-07-08

## 2020-01-14 MED ORDER — TIZANIDINE HCL 2 MG PO TABS: 2.0000 mg | ORAL_TABLET | Freq: Three times a day (TID) | ORAL | 0 refills | Status: DC | PRN

## 2020-01-14 NOTE — Assessment & Plan Note (Addendum)
BP elevated due to medication non-adherence. Given low normal HR, I am fine with her keep taking Coreg at 12.5 mg BID. I did go over her Hydralazin regimen, I.e 75 mg QID. Continue Aldactone at 50 mg QD. Bmet checked today also to assess her kidney function. I did mention to her that her creatinine level were elevated in her last few checks. I will contact her soon with her result. Med refills done today.  Addendum: I called the patient back to further discuss her hydralazin regimen since the dosing is somewhat confusing. She has both 25 mg and 50 mg tab and was under the impression that she was supposed to take 75/50/50 qd instead of 75 mg TID. To reduce her pill burden, I discussed switching her from Hydralazin 75 mg QID to 100 mg TID which will both equate to a total of 300 mg daily (maximum daily dose). She had been on Hydralazine 100 mg TID in the past. She agreed with the switch. Med refilled. Advised home BP monitoring. F/U with me in 2-3 weeks. Appointment made. I also discussed her with our pharmacist/Dr. Valentina Lucks. He agreed to see her in the pharmacy clinic. I will reach out to her to schedule an appointment.

## 2020-01-14 NOTE — Assessment & Plan Note (Signed)
Checked TSH today. I will contact her with result. COntinue Synthroid 150 mcg pending result.

## 2020-01-14 NOTE — Assessment & Plan Note (Signed)
Recent CT cervical spine in March looks good. ?? Muscle strain in the neck. Use Tylenol as needed for pain. I refilled her muscle relaxant. F/U soon if there is no improvement.

## 2020-01-14 NOTE — Assessment & Plan Note (Signed)
Still did not follow-up at the pharmacy clinic for PSK3 inhibitors. Will have him see Dr. Valentina Lucks to help set this up for her.

## 2020-01-14 NOTE — Patient Instructions (Signed)
Please take Hydralazine 75 mg four times daily. Continue Coreg 12.5 mg BID Continue Aldactone 50 mg daily.  Please check BP regularly at home. I will see you in 2 weeks.

## 2020-01-14 NOTE — Assessment & Plan Note (Signed)
No palpable nodule on her neck exam. Given previous hx of thyroid nodule, I ordered US to further assess. TSH checked. I will contact her soon with result.
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
## 2020-01-15 ENCOUNTER — Telehealth: Payer: Self-pay | Admitting: Family Medicine

## 2020-01-15 ENCOUNTER — Encounter: Payer: Self-pay | Admitting: Family Medicine

## 2020-01-15 DIAGNOSIS — N182 Chronic kidney disease, stage 2 (mild): Secondary | ICD-10-CM | POA: Insufficient documentation

## 2020-01-15 LAB — BASIC METABOLIC PANEL
BUN/Creatinine Ratio: 15 (ref 9–23)
BUN: 16 mg/dL (ref 6–24)
CO2: 24 mmol/L (ref 20–29)
Calcium: 10.2 mg/dL (ref 8.7–10.2)
Chloride: 102 mmol/L (ref 96–106)
Creatinine, Ser: 1.1 mg/dL — ABNORMAL HIGH (ref 0.57–1.00)
GFR calc Af Amer: 64 mL/min/{1.73_m2} (ref >59)
GFR calc non Af Amer: 56 mL/min/{1.73_m2} — ABNORMAL LOW (ref >59)
Glucose: 97 mg/dL (ref 65–99)
Potassium: 4.5 mmol/L (ref 3.5–5.2)
Sodium: 140 mmol/L (ref 134–144)

## 2020-01-15 LAB — TSH: TSH: 9.93 u[IU]/mL — ABNORMAL HIGH (ref 0.450–4.500)

## 2020-01-15 MED ORDER — LEVOTHYROXINE SODIUM 175 MCG PO TABS: 175.0000 ug | ORAL_TABLET | Freq: Every day | ORAL | 1 refills | Status: DC

## 2020-01-15 NOTE — Telephone Encounter (Signed)
I called and discussed test results with her.  Her TSH level increased. She claimed compliance with her Synthroid 150 mcg qd. I discussed increasing her dose and she agreed with the plan.  Increase Synthroid to 175 mcg qd.  I discussed her kidney function as well. BP control may improve her kidney function or delay further impairment. She verbalized understanding.

## 2020-01-15 NOTE — Telephone Encounter (Signed)
Transmission failed.  Called in verbally.  Christen Bame, CMA

## 2020-01-15 NOTE — Telephone Encounter (Signed)
Rx seems to have already been faxed.  LMOVM of pharmacy informing of duplicate fill and to discard one. Christen Bame, CMA

## 2020-01-15 NOTE — Addendum Note (Signed)
Addended by: Christen Bame D on: 01/15/2020 10:32 AM   Modules accepted: Orders

## 2020-01-15 NOTE — Telephone Encounter (Signed)
Thanks. However, I already placed a script to be faxed earlier this morning around 9 AM. Please check to ensure it has not been faxed, so the pharmacy will not have two different refill request. Thanks.

## 2020-01-15 NOTE — Progress Notes (Signed)
Spoke with patient and she states that she has already discussed with pcp and patient is unable to find a date that works for both her and Dr. Graylin Shiver schedule due to other appointments.  Patient will call back to make this appt.  Tykeisha Peer,CMA

## 2020-01-17 ENCOUNTER — Encounter: Payer: Self-pay | Admitting: Family Medicine

## 2020-01-20 ENCOUNTER — Ambulatory Visit (INDEPENDENT_AMBULATORY_CARE_PROVIDER_SITE_OTHER): Payer: 59 | Admitting: *Deleted

## 2020-01-20 DIAGNOSIS — I5032 Chronic diastolic (congestive) heart failure: Secondary | ICD-10-CM

## 2020-01-21 LAB — CUP PACEART REMOTE DEVICE CHECK
Date Time Interrogation Session: 20210725233234
Implantable Pulse Generator Implant Date: 20190325

## 2020-01-23 ENCOUNTER — Ambulatory Visit (HOSPITAL_COMMUNITY)
Admission: RE | Admit: 2020-01-23 | Discharge: 2020-01-23 | Disposition: A | Discharge status: Home / Self Care | Payer: 59 | Source: Ambulatory Visit | Attending: Family Medicine | Admitting: Family Medicine

## 2020-01-23 DIAGNOSIS — E041 Nontoxic single thyroid nodule: Secondary | ICD-10-CM | POA: Insufficient documentation

## 2020-01-23 NOTE — Progress Notes (Signed)
[facility] Summary Report / Loop Recorder 

## 2020-01-27 ENCOUNTER — Telehealth: Payer: Self-pay | Admitting: Family Medicine

## 2020-01-27 NOTE — Telephone Encounter (Signed)
Result discussed with the patient. All her questions were answered.   IMPRESSION: 1. Stable small thyroid with subcentimeter nodules. None meets criteria for biopsy. 2. Recommend annual/biennial ultrasound follow-up of inferior left nodule as above, until stability x5 years confirmed.  The above is in keeping with the ACR TI-RADS recommendations - J Am Coll Radiol 2017;14:587-595.

## 2020-02-04 ENCOUNTER — Other Ambulatory Visit: Payer: Self-pay

## 2020-02-04 ENCOUNTER — Ambulatory Visit (INDEPENDENT_AMBULATORY_CARE_PROVIDER_SITE_OTHER): Payer: 59 | Admitting: Family Medicine

## 2020-02-04 ENCOUNTER — Encounter: Payer: Self-pay | Admitting: Family Medicine

## 2020-02-04 VITALS — BP 148/80 | HR 78 | Ht 65.0 in | Wt 207.0 lb

## 2020-02-04 DIAGNOSIS — R42 Dizziness and giddiness: Secondary | ICD-10-CM

## 2020-02-04 DIAGNOSIS — I10 Essential (primary) hypertension: Secondary | ICD-10-CM

## 2020-02-04 DIAGNOSIS — E038 Other specified hypothyroidism: Secondary | ICD-10-CM | POA: Diagnosis not present

## 2020-02-04 DIAGNOSIS — R0602 Shortness of breath: Secondary | ICD-10-CM | POA: Diagnosis not present

## 2020-02-04 DIAGNOSIS — S5291XA Unspecified fracture of right forearm, initial encounter for closed fracture: Secondary | ICD-10-CM | POA: Diagnosis not present

## 2020-02-04 DIAGNOSIS — K219 Gastro-esophageal reflux disease without esophagitis: Secondary | ICD-10-CM

## 2020-02-04 NOTE — Patient Instructions (Signed)
Shortness of Breath, Adult Shortness of breath means you have trouble breathing. Shortness of breath could be a sign of a medical problem. Follow these instructions at home:   Watch for any changes in your symptoms.  Do not use any products that contain nicotine or tobacco, such as cigarettes, e-cigarettes, and chewing tobacco.  Do not smoke. Smoking can cause shortness of breath. If you need help to quit smoking, ask your doctor.  Avoid things that can make it harder to breathe, such as: ? Mold. ? Dust. ? Air pollution. ? Chemical smells. ? Things that can cause allergy symptoms (allergens), if you have allergies.  Keep your living space clean. Use products that help remove mold and dust.  Rest as needed. Slowly return to your normal activities.  Take over-the-counter and prescription medicines only as told by your doctor. This includes oxygen therapy and inhaled medicines.  Keep all follow-up visits as told by your doctor. This is important. Contact a doctor if:  Your condition does not get better as soon as expected.  You have a hard time doing your normal activities, even after you rest.  You have new symptoms. Get help right away if:  Your shortness of breath gets worse.  You have trouble breathing when you are resting.  You feel light-headed or you pass out (faint).  You have a cough that is not helped by medicines.  You cough up blood.  You have pain with breathing.  You have pain in your chest, arms, shoulders, or belly (abdomen).  You have a fever.  You cannot walk up stairs.  You cannot exercise the way you normally do. These symptoms may represent a serious problem that is an emergency. Do not wait to see if the symptoms will go away. Get medical help right away. Call your local emergency services (911 in the [place] Do not drive yourself to the hospital. Summary  Shortness of breath is when you have trouble breathing enough air. It can be a sign of a  medical problem.  Avoid things that make it hard for you to breathe, such as smoking, pollution, mold, and dust.  Watch for any changes in your symptoms. Contact your doctor if you do not get better or you get worse. This information is not intended to replace advice given to you by your health care provider. Make sure you discuss any questions you have with your health care provider. Document Revised: 11/13/2017 Document Reviewed: 11/13/2017 Elsevier Patient Education  2020 Elsevier Inc.  

## 2020-02-04 NOTE — Assessment & Plan Note (Signed)
S/P ORIF and PT evaluation. Pain mild and chronic. No acute findings. Continue Tylenol as needed.   Left axilla pain: No tenderness on exam. ?? MSK muscle strain due to her lying on the side at night. Use Tylenol as needed. Monitor closely.

## 2020-02-04 NOTE — Assessment & Plan Note (Signed)
Continue current dose of Synthroid. Recheck TSH in about 2-4 weeks.

## 2020-02-04 NOTE — Progress Notes (Signed)
SUBJECTIVE:   CHIEF COMPLAINT / HPI:   HTN: She is compliant with Hydralazine 100 mg TID, Aldactone 50 mg qd and Coreg 12.5 mg BID. Her home BP readings has improved. She is here for f/u.  Hypothyroidism: Now on Synthroid 175 mcg qd. Denies any concerns.  Right forearm/Left axilla pain/Epigastric discomfort: She got her right arm brace off f/u fracture. Her forearm pain persists but is tolerable, about 4/10 in severity now. She completed her PT session and now doing home self forearm exercise. She also c/o left lateral chest/axilla pain, which started about one week ago. The pain was described as pressure-like pain about 3/10 in severity. Sometimes she has similar pain in her epigastric region, feels pressure-like, or like she has gas builed up in her chest. She denies reflux like symptoms. She said she knows what reflux feels like and this is different.  Dizziness:On and off, currently asymptomatic. This self resolves whenever she squeeze her butt muscle for a few seconds. No recent fall, headache or LOC.  SOB: C/O for months, now worsening with ambulation/exertion. Denies chest pain, no cough, no fever. SOB improves with rest.  PERTINENT  PMH / PSH: PMX reviewed.   OBJECTIVE:   BP (!) 148/80   Pulse 78   Ht 5\' 5"  (1.651 m)   Wt 207 lb (93.9 kg)   SpO2 98%   BMI 34.45 kg/m   Physical Exam Vitals and nursing note reviewed.  Cardiovascular:     Rate and Rhythm: Normal rate and regular rhythm.     Heart sounds: Normal heart sounds. No murmur heard.   Pulmonary:     Effort: Pulmonary effort is normal. No respiratory distress.     Breath sounds: Normal breath sounds. No wheezing or rhonchi.  Abdominal:     General: Abdomen is flat. Bowel sounds are normal. There is no distension.     Palpations: Abdomen is soft. There is no mass.     Tenderness: There is no abdominal tenderness.  Musculoskeletal:     Right shoulder: Normal.     Left shoulder: Normal.     Right upper arm:  Tenderness present.     Left upper arm: Normal.     Right lower leg: No edema.     Left lower leg: No edema.     Comments: Right forearm mild tenderness, no swelling or erythema. Linear surgical scare well healed on the ventral surface of his right forearm.  Neurological:     General: No focal deficit present.     Cranial Nerves: Cranial nerves are intact.     Sensory: Sensation is intact.     Motor: No weakness.     Coordination: Coordination is intact.     Gait: Gait is intact.      ASSESSMENT/PLAN:   Essential hypertension BP improved from last visit. Monitor on her current regimen.  Hypothyroidism Continue current dose of Synthroid. Recheck TSH in about 2-4 weeks.  Right forearm fracture S/P ORIF and PT evaluation. Pain mild and chronic. No acute findings. Continue Tylenol as needed.   Left axilla pain: No tenderness on exam. ?? MSK muscle strain due to her lying on the side at night. Use Tylenol as needed. Monitor closely.   GERD (gastroesophageal reflux disease) As discussed with her, her epigastric discomfort may be GERD. However, she stated that this does not feel like her typical GERD symptoms. Feels like gas in her stomach. She is up to date with her colon cancer screen. Since  it is intermittent, we can watch. Consider PPI in the future.  Dizziness Currently asymptomatic. Improves with valsalva maneuver by squeezing her but muscle tight. ?? Vertigo. Recent hemoglobin level is normal. I reviewed her CT head from March which showed no intracranial pathology. No neurologic deficit on exam today. She is on loop recorder which so far had not shown Afib. We will repeat labs if this persists. Return precaution discussed.  Dyspnea Worsening in the last few months. Last ECHO done in 2019 reviewed and discussed with her. I recommended repeating ECHO to evaluate her LVEF. ECHO ordered and appointment was made for her during this visit.     [name], MD [place]

## 2020-02-04 NOTE — Assessment & Plan Note (Signed)
BP improved from last visit. Monitor on her current regimen.

## 2020-02-04 NOTE — Assessment & Plan Note (Signed)
Worsening in the last few months. Last ECHO done in 2019 reviewed and discussed with her. I recommended repeating ECHO to evaluate her LVEF. ECHO ordered and appointment was made for her during this visit.

## 2020-02-04 NOTE — Assessment & Plan Note (Signed)
Currently asymptomatic. Improves with valsalva maneuver by squeezing her but muscle tight. ?? Vertigo. Recent hemoglobin level is normal. I reviewed her CT head from March which showed no intracranial pathology. No neurologic deficit on exam today. She is on loop recorder which so far had not shown Afib. We will repeat labs if this persists. Return precaution discussed.

## 2020-02-04 NOTE — Assessment & Plan Note (Addendum)
As discussed with her, her epigastric discomfort may be GERD. However, she stated that this does not feel like her typical GERD symptoms. Feels like gas in her stomach. She is up to date with her colon cancer screen. Since it is intermittent, we can watch. Consider PPI in the future.

## 2020-02-05 ENCOUNTER — Encounter: Payer: Self-pay | Admitting: Family Medicine

## 2020-02-10 ENCOUNTER — Ambulatory Visit (HOSPITAL_COMMUNITY)
Admission: RE | Admit: 2020-02-10 | Discharge: 2020-02-10 | Disposition: A | Discharge status: Home / Self Care | Payer: 59 | Source: Ambulatory Visit | Attending: Family Medicine | Admitting: Family Medicine

## 2020-02-10 ENCOUNTER — Other Ambulatory Visit: Payer: Self-pay

## 2020-02-10 ENCOUNTER — Telehealth: Payer: Self-pay | Admitting: Family Medicine

## 2020-02-10 DIAGNOSIS — R0609 Other forms of dyspnea: Secondary | ICD-10-CM

## 2020-02-10 DIAGNOSIS — I351 Nonrheumatic aortic (valve) insufficiency: Secondary | ICD-10-CM | POA: Insufficient documentation

## 2020-02-10 DIAGNOSIS — K219 Gastro-esophageal reflux disease without esophagitis: Secondary | ICD-10-CM | POA: Diagnosis not present

## 2020-02-10 DIAGNOSIS — R0600 Dyspnea, unspecified: Secondary | ICD-10-CM

## 2020-02-10 DIAGNOSIS — I5030 Unspecified diastolic (congestive) heart failure: Secondary | ICD-10-CM

## 2020-02-10 DIAGNOSIS — E039 Hypothyroidism, unspecified: Secondary | ICD-10-CM | POA: Insufficient documentation

## 2020-02-10 DIAGNOSIS — E785 Hyperlipidemia, unspecified: Secondary | ICD-10-CM | POA: Diagnosis not present

## 2020-02-10 DIAGNOSIS — R0602 Shortness of breath: Secondary | ICD-10-CM | POA: Insufficient documentation

## 2020-02-10 LAB — ECHOCARDIOGRAM COMPLETE
Area-P 1/2: 2.2 cm2
P 1/2 time: 604 msec
S' Lateral: 3 cm

## 2020-02-10 NOTE — Telephone Encounter (Signed)
ECHO report discussed with her. EF dropped a bid but still within range. +G1DD.   She will like second opinion from cardiology for her SOB. Referral placed. ED/clinic return precaution discussed.

## 2020-02-10 NOTE — Progress Notes (Signed)
  Echocardiogram 2D Echocardiogram has been performed.  Aaniya Sterba G Briscoe Daniello 02/10/2020, 12:01 PM

## 2020-02-24 ENCOUNTER — Ambulatory Visit (INDEPENDENT_AMBULATORY_CARE_PROVIDER_SITE_OTHER): Payer: 59 | Admitting: *Deleted

## 2020-02-24 DIAGNOSIS — I639 Cerebral infarction, unspecified: Secondary | ICD-10-CM

## 2020-02-25 LAB — CUP PACEART REMOTE DEVICE CHECK
Date Time Interrogation Session: 20210827235048
Implantable Pulse Generator Implant Date: 20190325

## 2020-02-26 NOTE — Progress Notes (Signed)
[facility] Summary Report / Loop Recorder 

## 2020-03-05 ENCOUNTER — Other Ambulatory Visit: Payer: Self-pay | Admitting: Family Medicine

## 2020-03-05 DIAGNOSIS — I10 Essential (primary) hypertension: Secondary | ICD-10-CM

## 2020-03-23 ENCOUNTER — Ambulatory Visit: Payer: 59 | Admitting: Cardiovascular Disease

## 2020-03-23 NOTE — Progress Notes (Deleted)
Cardiology Office Note   Date:  03/23/2020   ID:  Teresa [name], DOB 1961-03-09, MRN [id number]  PCP:  Kinnie Feil, MD  Cardiologist:   Skeet Latch, MD   No chief complaint on file.    History of Present Illness: Teresa [name] is a 59 y.o. female with  stroke, hypertension, palpitations, moderate LVH, and hyperthyroidism who presents for follow up.  Teresa [name] saw [name], MD, on 01/05/16.  At that appointment she reported occasional palpitations and burning in her chest.  She had an echo on 7/13 showed LVEF 55-60% with grade 2 diastolic dysfunction and mild MR.  Teresa [name] reports one year of palpitations and chest pain that occurs sporadically.  She was referred for [facility] 02/24/16 that revealed LVEF 47% and no ischemia.  At her appointment on 01/2016 her blood pressure was elevated so she was switched from metoprolol to carvedilol.  She was subsequently diagnosed with cervicalgia.  Teresa [name] presented 08/2017 with cryptogenic embolic stroke.  Echo at that time revealed LVEF 60 to 65% with grade 1 diastolic dysfunction and moderate aortic regurgitation She had a TEE that was negative for left atrial appendage thrombus and her aortic regurgitation was noted to be mild.  A loop recorder was implanted and no significant arrhythmias have been recorded thus far.   Amlodipine was discontinued due to swelling.   At her last appointment chlorthalidone was added.  Hydralazine was switched from qid to tid.  She was referred to pharmacy to start [place].  She saw our pharmacists 01/2018 and switched Zetia to [place].  However she never received the medication.  She has been exercising daily and walks for 30 minutes followed by using for exercise machines.  She has no exertional chest pain or shortness of breath.  She feels great with exercise.  She has no lower extremity edema, orthopnea, or PND.  Her main complaint is pain throughout the left side of her body.  This is been ongoing since  August.  She had an MRI of her brain which did not reveal any etiology.  She also complains of spasms in her neck.  At some point she started taking hydralazine once daily instead of 3 times a day.  She is unsure why this change was made.  She is been stressed lately because she is caring for her mother who was recently diagnosed with lung cancer.  At her last appointment Teresa [name]'s blood pressure was very elevated.  She was taking her hydralazine only once daily.  This was resumed at 3 times a day and carvedilol was increased.  She was scheduled to follow up in one month and hasn't been seen since 03/2018.  Past Medical History:  Diagnosis Date  . Allergy   . Anemia   . Cervical disc disorder with radiculopathy of cervical region 02/10/2016  . Chronic lower back pain   . Diverticulosis   . EKG, abnormal 01/06/2017  . Gastritis   . GERD (gastroesophageal reflux disease)   . H/O hiatal hernia   . Hiatal hernia   . History of amputation of lesser toe of left foot ([place]) 03/11/2015  . History of colon cancer   . Hypertension   . Hyperthyroidism    hx  . Hypothyroidism   . Lateral knee pain, left 08/06/2013  . Migraine headache    "a few times/yr" (03/05/2015)  . Neck strain 02/06/2012  . Osteomyelitis ([place])    [name] 03/05/2015  . Osteomyelitis of toe of  left foot ([place]) 03/05/2015  . Palpitations 01/05/2016  . Positive TB test   . Stroke ([place])   . Tubular adenoma of colon   . Tubular adenoma of colon   . Ulnar neuropathy at elbow of right upper extremity 06/12/2018    Past Surgical History:  Procedure Laterality Date  . ABDOMINAL HYSTERECTOMY  2006  . AMPUTATION TOE Left 03/05/2015   Procedure: AMPUTATION LEFT 5TH  TOE;  Surgeon: Wylene Simmer, MD;  Location: [place];  Service: Orthopedics;  Laterality: Left;  . APPENDECTOMY  2006  . BREAST BIOPSY Left 11/26/2014  . [employer]  . COLONOSCOPY    . GALLBLADDER SURGERY    . LOOP RECORDER INSERTION N/A 09/18/2017    Procedure: LOOP RECORDER INSERTION;  Surgeon: Evans Lance, MD;  Location: [place] [facility];  Service: Cardiovascular;  Laterality: N/A;  . OPEN REDUCTION INTERNAL FIXATION (ORIF) DISTAL RADIAL FRACTURE Right 09/14/2019   Procedure: OPEN REDUCTION INTERNAL FIXATION (ORIF)  RADIAL AND ULNA FRACTURE;  Surgeon: Iran Planas, MD;  Location: [place];  Service: Orthopedics;  Laterality: Right;  . ORIF ULNAR FRACTURE Right 09/14/2019  . TEE WITHOUT CARDIOVERSION N/A 09/18/2017   Procedure: TRANSESOPHAGEAL ECHOCARDIOGRAM (TEE);  Surgeon: Pixie Casino, MD;  Location[facility];  Service: Cardiovascular;  Laterality: N/A;  . TOE AMPUTATION Left 03/05/2015   5th toe  . TUBAL LIGATION Bilateral 1990  . UPPER GASTROINTESTINAL ENDOSCOPY       Current Outpatient Medications  Medication Sig Dispense Refill  . albuterol (PROVENTIL HFA;VENTOLIN HFA) 108 (90 Base) MCG/ACT inhaler Inhale 2 puffs into the lungs every 6 (six) hours as needed for wheezing or shortness of breath. (Patient not taking: Reported on 01/14/2020) 1 Inhaler 2  . aspirin 81 MG chewable tablet Chew 1 tablet (81 mg total) by mouth daily. 30 tablet 0  . B Complex Vitamins (VITAMIN B COMPLEX PO) Take 1 tablet by mouth daily.    . Carboxymethylcellulose Sodium (EYE DROPS OP) Place 1 drop into both eyes See admin instructions. Place 1-2 drops into both eyes three to six times a day    . carvedilol (COREG) 12.5 MG tablet Take 1 tablet (12.5 mg total) by mouth 2 (two) times daily with a meal. 30 tablet 1  . Evolocumab (REPATHA SURECLICK) 532 MG/ML SOAJ Inject 140 mg into the skin every 14 (fourteen) days. (Patient not taking: Reported on 01/14/2020) 2 pen 12  . fluticasone (FLONASE) 50 MCG/ACT nasal spray Place 1 spray into both nostrils daily. 16 g 0  . hydrALAZINE (APRESOLINE) 100 MG tablet Take 1 tablet (100 mg total) by mouth 3 (three) times daily. 270 tablet 0  . levothyroxine (SYNTHROID) 175 MCG tablet Take 1 tablet (175 mcg total) by  mouth daily before breakfast. 90 tablet 1  . Multiple Vitamins-Minerals (CENTRUM MULTIGUMMIES) CHEW Chew 2 tablets by mouth daily.    . Probiotic Product (PROBIOTIC DAILY PO) Take 1 tablet by mouth.    . promethazine (PHENERGAN) 12.5 MG tablet Take 1 tablet (12.5 mg total) by mouth every 8 (eight) hours as needed for nausea or vomiting. 20 tablet 0  . spironolactone (ALDACTONE) 50 MG tablet Take 1 tablet by mouth once daily 90 tablet 0  . tiZANidine (ZANAFLEX) 2 MG tablet Take 1 tablet (2 mg total) by mouth every 8 (eight) hours as needed for up to 60 doses for muscle spasms. 30 tablet 0  . VITAMIN D PO Take 1 tablet by mouth daily.  Current Facility-Administered Medications  Medication Dose Route Frequency Provider Last Rate Last Admin  . olopatadine (PATANOL) 0.1 % ophthalmic solution 1 drop  1 drop Both Eyes BID McDiarmid, Blane Ohara, MD        Allergies:   Bactrim [sulfamethoxazole-trimethoprim], Clonidine derivatives, Amlodipine, Lisinopril, Statins, Aspirin, and Latex    Social History:  The patient  reports that she quit smoking about 2 years ago. Her smoking use included cigarettes. She has a 18.00 pack-year smoking history. She has never used smokeless tobacco. She reports that she does not drink alcohol and does not use drugs.   Family History:  The patient's family history includes Breast cancer in her maternal aunt and paternal aunt; Cancer (age of onset: 48) in her father; Diabetes in her brother, mother, sister, and another family member; Heart disease in her mother and another family member; Kidney disease in her brother and sister; Stroke in her father.    ROS:  Please see the history of present illness.   Otherwise, review of systems are positive for neck pain.   All other systems are reviewed and negative.    PHYSICAL EXAM: VS:  There were no vitals taken for this visit. , BMI There is no height or weight on file to calculate BMI. GENERAL:  Well appearing HEENT: Pupils  equal round and reactive, fundi not visualized, oral mucosa unremarkable NECK:  No jugular venous distention, waveform within normal limits, carotid upstroke brisk and symmetric, no bruits LUNGS:  Clear to auscultation bilaterally HEART:  RRR.  PMI not displaced or sustained,S1 and S2 within normal limits, no S3, no S4, no clicks, no rubs, no murmurs ABD:  Flat, positive bowel sounds normal in frequency in pitch, no bruits, no rebound, no guarding, no midline pulsatile mass, no hepatomegaly, no splenomegaly EXT:  2 plus pulses throughout, no edema, no cyanosis no clubbing SKIN:  No rashes no nodules NEURO:  Cranial nerves II through XII grossly intact, motor grossly intact throughout PSYCH:  Cognitively intact, oriented to person place and time   EKG:  EKG is not ordered today. The ekg ordered 02/11/16 demonstrates sinus bradycardia.  Rate 59 bpm.  Inferolateral TWI, concerning for inferolateral ischemia.  Unchanged from 03/09/15.  Lexiscan Myoview 02/24/16:  Nuclear stress EF: 47% by computer calculations. Visually , the EF is normal and is around 55-60%.  There was no ST segment deviation noted during stress.  The study is normal.  This is a low risk study.  Echo 09/16/17: Study Conclusions  - Left ventricle: The cavity size was normal. There was moderate   concentric hypertrophy. Systolic function was normal. The   estimated ejection fraction was in the range of 60% to 65%. Sporer   motion was normal; there were no regional Wurth motion   abnormalities. Doppler parameters are consistent with abnormal   left ventricular relaxation (grade 1 diastolic dysfunction).   Doppler parameters are consistent with elevated ventricular   end-diastolic filling pressure. - Aortic valve: There was moderate regurgitation. - Mitral valve: Calcified annulus. Mildly thickened leaflets .   There was mild regurgitation. - Left atrium: The atrium was mildly dilated. - Right ventricle: Systolic function  was normal. - Tricuspid valve: There was mild regurgitation. - Pulmonic valve: There was trivial regurgitation. - Pulmonary arteries: The main pulmonary artery was normal-sized.   Systolic pressure was within the normal range. - Inferior vena cava: The vessel was normal in size. - Pericardium, extracardiac: There was no pericardial effusion.   Recent Labs: 09/18/2019:  ALT 30; Magnesium 1.7 09/24/2019: Hemoglobin 13.1; Platelets 390 01/14/2020: BUN 16; Creatinine, Ser 1.10; Potassium 4.5; Sodium 140; TSH 9.930    Lipid Panel    Component Value Date/Time   CHOL 163 08/20/2019 1129   TRIG 55 08/20/2019 1129   HDL 44 08/20/2019 1129   CHOLHDL 3.7 08/20/2019 1129   CHOLHDL 4.3 09/15/2017 1217   VLDL 13 09/15/2017 1217   LDLCALC 108 (H) 08/20/2019 1129      Wt Readings from Last 3 Encounters:  02/04/20 207 lb (93.9 kg)  01/14/20 209 lb 4 oz (94.9 kg)  09/24/19 203 lb 6.4 oz (92.3 kg)      ASSESSMENT AND PLAN:  # Atypical chest pain: Resolved. Lexiscan Myoview was negative.   # Hypertension: BP remains poorly controlled.  It is unclear why her hydralazine has switched to only once per day.  We will resume 50 mg 3 times daily.  Also increase carvedilol to 25 mg twice a day.  She will check her blood pressures at home and if her blood pressure remains elevated she will increase hydralazine to 100 mg 3 times a day.  # Embolic stroke: Idiopathic.  Loop recorder in place.  Hypertension and lipid management as above.   # Hyperlipidemia: We will follow-up with pharmacy regarding Repatha.  # Chronic diastolic heart failure: Teresa [name] appears euvolemic on exam.  BP control as above.    # Palpitations: Stable.  # OSA: Not severe enough for CPAP 03/2016.  # Tobacco abuse:   Current medicines are reviewed at length with the patient today.  The patient does not have concerns regarding medicines.  The following changes have been made:  Increase carvedilol and hydralazine.  Labs/ tests  ordered today include:   No orders of the defined types were placed in this encounter.    Disposition:   FU with Janielle Mittelstadt C. Oval Linsey, MD, [facility] in 3 months.  Pharm.D. in 1 month.   Signed, Clayton Bosserman C. Oval Linsey, MD, [facility]  03/23/2020 8:48 AM    [facility]

## 2020-03-30 ENCOUNTER — Ambulatory Visit (INDEPENDENT_AMBULATORY_CARE_PROVIDER_SITE_OTHER): Payer: 59

## 2020-03-30 DIAGNOSIS — I639 Cerebral infarction, unspecified: Secondary | ICD-10-CM

## 2020-03-30 LAB — CUP PACEART REMOTE DEVICE CHECK
Date Time Interrogation Session: 20210929235122
Implantable Pulse Generator Implant Date: 20190325

## 2020-04-01 NOTE — Progress Notes (Signed)
[facility] Summary Report / Loop Recorder 

## 2020-04-13 ENCOUNTER — Encounter: Payer: Self-pay | Admitting: Family Medicine

## 2020-04-14 ENCOUNTER — Other Ambulatory Visit: Payer: Self-pay

## 2020-04-14 ENCOUNTER — Ambulatory Visit (INDEPENDENT_AMBULATORY_CARE_PROVIDER_SITE_OTHER): Payer: 59 | Admitting: Family Medicine

## 2020-04-14 ENCOUNTER — Encounter: Payer: Self-pay | Admitting: Family Medicine

## 2020-04-14 VITALS — BP 162/80 | HR 71 | Ht 65.0 in | Wt 210.0 lb

## 2020-04-14 DIAGNOSIS — R7309 Other abnormal glucose: Secondary | ICD-10-CM | POA: Diagnosis not present

## 2020-04-14 DIAGNOSIS — I10 Essential (primary) hypertension: Secondary | ICD-10-CM | POA: Diagnosis not present

## 2020-04-14 DIAGNOSIS — E669 Obesity, unspecified: Secondary | ICD-10-CM

## 2020-04-14 DIAGNOSIS — M6530 Trigger finger, unspecified finger: Secondary | ICD-10-CM

## 2020-04-14 DIAGNOSIS — E039 Hypothyroidism, unspecified: Secondary | ICD-10-CM

## 2020-04-14 DIAGNOSIS — R7303 Prediabetes: Secondary | ICD-10-CM

## 2020-04-14 LAB — POCT GLYCOSYLATED HEMOGLOBIN (HGB A1C): HbA1c, POC (controlled diabetic range): 6.3 % (ref 0.0–7.0)

## 2020-04-14 MED ORDER — OZEMPIC (0.25 OR 0.5 MG/DOSE) 2 MG/1.5ML ~~LOC~~ SOPN: PEN_INJECTOR | SUBCUTANEOUS | 2 refills | Status: DC

## 2020-04-14 MED ORDER — IBUPROFEN 400 MG PO TABS
400.0000 mg | ORAL_TABLET | Freq: Three times a day (TID) | ORAL | 1 refills | Status: DC | PRN
Start: 2020-04-14 — End: 2020-06-12

## 2020-04-14 NOTE — Assessment & Plan Note (Signed)
TSH rechecked today.

## 2020-04-14 NOTE — Assessment & Plan Note (Addendum)
Associated with metabolic syndrome. I discussed initiating GLP1 for weight management. This will also serve as renoprotector as well as cardiovascular benefit. She denies hx of Thyroid cancer and no documentation of such on her file. She agreed to trail this treatment. Semaglutide escribed. She was instructed to make Pharmacy appointment for injection education once she obtain her medication. I discussed diet and exercise continuation while on this medication. She agreed with the plan.

## 2020-04-14 NOTE — Patient Instructions (Signed)
Semaglutide injection solution What is this medicine? SEMAGLUTIDE (Sem a GLOO tide) is used to improve blood sugar control in adults with type 2 diabetes. This medicine may be used with other diabetes medicines. This drug may also reduce the risk of heart attack or stroke if you have type 2 diabetes and risk factors for heart disease. This medicine may be used for other purposes; ask your health care provider or pharmacist if you have questions. COMMON BRAND NAME(S): OZEMPIC What should I tell my health care provider before I take this medicine? They need to know if you have any of these conditions:  endocrine tumors (MEN 2) or if someone in your family had these tumors  eye disease, vision problems  history of pancreatitis  kidney disease  stomach problems  thyroid cancer or if someone in your family had thyroid cancer  an unusual or allergic reaction to semaglutide, other medicines, foods, dyes, or preservatives  pregnant or trying to get pregnant  breast-feeding How should I use this medicine? This medicine is for injection under the skin of your upper leg (thigh), stomach area, or upper arm. It is given once every week (every 7 days). You will be taught how to prepare and give this medicine. Use exactly as directed. Take your medicine at regular intervals. Do not take it more often than directed. If you use this medicine with insulin, you should inject this medicine and the insulin separately. Do not mix them together. Do not give the injections right next to each other. Change (rotate) injection sites with each injection. It is important that you put your used needles and syringes in a special sharps container. Do not put them in a trash can. If you do not have a sharps container, call your pharmacist or healthcare provider to get one. A special MedGuide will be given to you by the pharmacist with each prescription and refill. Be sure to read this information carefully each  time. This drug comes with INSTRUCTIONS FOR USE. Ask your pharmacist for directions on how to use this drug. Read the information carefully. Talk to your pharmacist or health care provider if you have questions. Talk to your pediatrician regarding the use of this medicine in children. Special care may be needed. Overdosage: If you think you have taken too much of this medicine contact a poison control center or emergency room at once. NOTE: This medicine is only for you. Do not share this medicine with others. What if I miss a dose? If you miss a dose, take it as soon as you can within 5 days after the missed dose. Then take your next dose at your regular weekly time. If it has been longer than 5 days after the missed dose, do not take the missed dose. Take the next dose at your regular time. Do not take double or extra doses. If you have questions about a missed dose, contact your health care provider for advice. What may interact with this medicine?  other medicines for diabetes Many medications may cause changes in blood sugar, these include:  alcohol containing beverages  antiviral medicines for HIV or AIDS  aspirin and aspirin-like drugs  certain medicines for blood pressure, heart disease, irregular heart beat  chromium  diuretics  female hormones, such as estrogens or progestins, birth control pills  fenofibrate  gemfibrozil  isoniazid  lanreotide  female hormones or anabolic steroids  MAOIs like Carbex, Eldepryl, Marplan, Nardil, and Parnate  medicines for weight loss  medicines for   allergies, asthma, cold, or cough  medicines for depression, anxiety, or psychotic disturbances  niacin  nicotine  NSAIDs, medicines for pain and inflammation, like ibuprofen or naproxen  octreotide  pasireotide  pentamidine  phenytoin  probenecid  quinolone antibiotics such as ciprofloxacin, levofloxacin, ofloxacin  some herbal dietary supplements  steroid medicines  such as prednisone or cortisone  sulfamethoxazole; trimethoprim  thyroid hormones Some medications can hide the warning symptoms of low blood sugar (hypoglycemia). You may need to monitor your blood sugar more closely if you are taking one of these medications. These include:  beta-blockers, often used for high blood pressure or heart problems (examples include atenolol, metoprolol, propranolol)  clonidine  guanethidine  reserpine This list may not describe all possible interactions. Give your health care provider a list of all the medicines, herbs, non-prescription drugs, or dietary supplements you use. Also tell them if you smoke, drink alcohol, or use illegal drugs. Some items may interact with your medicine. What should I watch for while using this medicine? Visit your doctor or health care professional for regular checks on your progress. Drink plenty of fluids while taking this medicine. Check with your doctor or health care professional if you get an attack of severe diarrhea, nausea, and vomiting. The loss of too much body fluid can make it dangerous for you to take this medicine. A test called the HbA1C (A1C) will be monitored. This is a simple blood test. It measures your blood sugar control over the last 2 to 3 months. You will receive this test every 3 to 6 months. Learn how to check your blood sugar. Learn the symptoms of low and high blood sugar and how to manage them. Always carry a quick-source of sugar with you in case you have symptoms of low blood sugar. Examples include hard sugar candy or glucose tablets. Make sure others know that you can choke if you eat or drink when you develop serious symptoms of low blood sugar, such as seizures or unconsciousness. They must get medical help at once. Tell your doctor or health care professional if you have high blood sugar. You might need to change the dose of your medicine. If you are sick or exercising more than usual, you might need  to change the dose of your medicine. Do not skip meals. Ask your doctor or health care professional if you should avoid alcohol. Many nonprescription cough and cold products contain sugar or alcohol. These can affect blood sugar. Pens should never be shared. Even if the needle is changed, sharing may result in passing of viruses like hepatitis or HIV. Wear a medical ID bracelet or chain, and carry a card that describes your disease and details of your medicine and dosage times. Do not become pregnant while taking this medicine. Women should inform their doctor if they wish to become pregnant or think they might be pregnant. There is a potential for serious side effects to an unborn child. Talk to your health care professional or pharmacist for more information. What side effects may I notice from receiving this medicine? Side effects that you should report to your doctor or health care professional as soon as possible:  allergic reactions like skin rash, itching or hives, swelling of the face, lips, or tongue  breathing problems  changes in vision  diarrhea that continues or is severe  lump or swelling on the neck  severe nausea  signs and symptoms of infection like fever or chills; cough; sore throat; pain or trouble   passing urine  signs and symptoms of low blood sugar such as feeling anxious, confusion, dizziness, increased hunger, unusually weak or tired, sweating, shakiness, cold, irritable, headache, blurred vision, fast heartbeat, loss of consciousness  signs and symptoms of kidney injury like trouble passing urine or change in the amount of urine  trouble swallowing  unusual stomach upset or pain  vomiting Side effects that usually do not require medical attention (report to your doctor or health care professional if they continue or are bothersome):  constipation  diarrhea  nausea  pain, redness, or irritation at site where injected  stomach upset This list may not  describe all possible side effects. Call your doctor for medical advice about side effects. You may report side effects to FDA at 1-800-FDA-1088. Where should I keep my medicine? Keep out of the reach of children. Store unopened pens in a refrigerator between 2 and 8 degrees C (36 and 46 degrees F). Do not freeze. Protect from light and heat. After you first use the pen, it can be stored for 56 days at room temperature between 15 and 30 degrees C (59 and 86 degrees F) or in a refrigerator. Throw away your used pen after 56 days or after the expiration date, whichever comes first. Do not store your pen with the needle attached. If the needle is left on, medicine may leak from the pen. NOTE: This sheet is a summary. It may not cover all possible information. If you have questions about this medicine, talk to your doctor, pharmacist, or health care provider.  2020 Elsevier/Gold Standard (2019-02-26 09:41:51)  

## 2020-04-14 NOTE — Progress Notes (Signed)
    SUBJECTIVE:   CHIEF COMPLAINT / HPI:  Hand Pain   C/O left-hand pain, ongoing for about three weeks. Pain is aching in nature, mostly affecting her left middle and ring fingers. Sometimes her finger will get stuck, and she will need to use her other hand to massage and get them back in position. Each episode occurs at least three times weekly. She denies numbness, weakness, or tingling sensation.  HTN: Here for f/u. She took all of her meds today which are: Coreg 12.5 mg BID,  Hydralazine 100 mg TID and Aldactone 50 mg qd. No other concerns.  Hypothyroidism: Compliant with Synthroid 175 mcg qd. No new concerns.  Weight management/PreDM: She is compliant with exercise and dieting. She is uncertain while she is not losing weight despite her effort.  PERTINENT  PMH / PSH: PMX reviewed  OBJECTIVE:   Vitals:   04/14/20 1110 04/14/20 1133  BP: (!) 150/84 (!) 162/80  Pulse: 71   SpO2: 98%   Weight: 210 lb (95.3 kg)   Height: 5\' 5"  (1.651 m)     Physical Exam Vitals and nursing note reviewed.  Cardiovascular:     Rate and Rhythm: Normal rate and regular rhythm.     Heart sounds: Normal heart sounds. No murmur heard.   Pulmonary:     Effort: Pulmonary effort is normal. No respiratory distress.     Breath sounds: Normal breath sounds. No wheezing or rhonchi.  Abdominal:     General: Bowel sounds are normal. There is no distension.     Palpations: Abdomen is soft. There is no mass.     Tenderness: There is no abdominal tenderness.  Musculoskeletal:     Right lower leg: No edema.     Left lower leg: No edema.     Comments: Rigid brace on her right hand. Normal ROM of her left hand and finger with mild tenderness at the wrist joint.  Neurological:     Mental Status: She is alert.      ASSESSMENT/PLAN:  Trigger finger - left: Discussed massaging and NSAID treatment. Ibuprofen prescribed prn pain. Consider xray in the future. She agreed with the plan.  Essential  hypertension Elevated BP despite medication compliance. Home BP diary discussed. F/U with me in 2 weeks for reassessment and med adjustment if needed. She agreed with the plan.  Hypothyroidism TSH rechecked today.  Obese Associated with metabolic syndrome. I discussed initiating GLP1 for weight management. This will also serve as renoprotector as well as cardiovascular benefit. She denies hx of Thyroid cancer and no documentation of such on her file. She agreed to trail this treatment. Semaglutide escribed. She was instructed to make Pharmacy appointment for injection education once she obtain her medication. I discussed diet and exercise continuation while on this medication. She agreed with the plan.  Prediabetes A1C 6.3 Diet and exercise counseling done. She is already doing a great job at that. Semaglutide added to her regimen. Monitor closely.     [name], MD [place]

## 2020-04-14 NOTE — Assessment & Plan Note (Signed)
Elevated BP despite medication compliance. Home BP diary discussed. F/U with me in 2 weeks for reassessment and med adjustment if needed. She agreed with the plan.

## 2020-04-14 NOTE — Assessment & Plan Note (Signed)
A1C 6.3 Diet and exercise counseling done. She is already doing a great job at that. Semaglutide added to her regimen. Monitor closely.

## 2020-04-15 ENCOUNTER — Telehealth: Payer: Self-pay | Admitting: Family Medicine

## 2020-04-15 LAB — TSH: TSH: 2.47 u[IU]/mL (ref 0.450–4.500)

## 2020-04-15 NOTE — Telephone Encounter (Signed)
TSH within normal range. Continue Synthroid at 169mcg qd. Repeat lab in 6 months.  She verbalized understanding.

## 2020-04-16 ENCOUNTER — Encounter: Payer: Self-pay | Admitting: Pharmacist

## 2020-04-16 ENCOUNTER — Other Ambulatory Visit: Payer: Self-pay

## 2020-04-16 ENCOUNTER — Ambulatory Visit (INDEPENDENT_AMBULATORY_CARE_PROVIDER_SITE_OTHER): Payer: 59 | Admitting: Pharmacist

## 2020-04-16 DIAGNOSIS — Z87891 Personal history of nicotine dependence: Secondary | ICD-10-CM

## 2020-04-16 DIAGNOSIS — I10 Essential (primary) hypertension: Secondary | ICD-10-CM

## 2020-04-16 DIAGNOSIS — R7303 Prediabetes: Secondary | ICD-10-CM

## 2020-04-16 NOTE — Progress Notes (Signed)
Reviewed: I agree with Dr. Koval's documentation and management. 

## 2020-04-16 NOTE — Assessment & Plan Note (Signed)
Hypertension is currently uncontrolled despite reported medication adherence, and frequent exercise. If additional agents are added in the future, consider alpha blockers like terazosin or doxazosin in this patient with multiple medication intolerances (ACE/ARB class, amlodipine and clonidine- angioedema). Patient plans to check and document blood pressure at home and will be seen by a cardiologist in the near future.

## 2020-04-16 NOTE — Patient Instructions (Addendum)
Nice to meet you today!  Continue taking Ozempic 0.25mg  injection once weekly on Thursdays. If you have any severe side effects, call the clinic to let us know.   Follow up in clinic with Dr. Gwendlyn Deutscher on 04/21/20.   Thanks!

## 2020-04-16 NOTE — Assessment & Plan Note (Signed)
Tobacco cessation - quit 08/2017 Congratulated on success and updated problem list to history of tobacco abuse.

## 2020-04-16 NOTE — Assessment & Plan Note (Signed)
Patient with pre-diabetes and obesity, prescribed Ozempic by Dr. Gwendlyn Deutscher arrives with supply of this new prescription. Patient educated on purpose, proper use and potential adverse effects of Ozempic.  Following instruction patient verbalized understanding and demonstrated proper injection technique. Plan to start with Ozempic 0.25mg  once weekly and titrate to goal dose based on patient response.

## 2020-04-16 NOTE — Progress Notes (Signed)
    S: Patient arrives for Ozempic injection demonstration. Patient was referred and last seen by Primary Care Provider on 10/19.   Family/Social History: thyroid disorder  She reports adherence to all medications. She is prediabetic with her last A1c of 6.3 and currently does not take diabetes medication. Her current hypertension medications are carvedilol 12.5mg  BID, spironolactone 50mg  QD, and hydralazine 100mg  TID. She does not take medication for hyperlipidemia (previously prescribed evolocumab).   She often has shortness of breath with minor activity and believes it may be related to her chronic hernia. She also reports a history of gallbladder removal but no procedures for the hernia. Per patient, her pulmonary function tests have been WNL and she rarely uses her albuterol.   She has not started taking her blood pressure at home but plans to soon, and has two monitors she can use at home. She plans to follow up with her cardiologist soon.  Patient-reported exercise habits: walks at the park at least 3-5 times per week  O:  Physical Exam Vitals reviewed.  Pulmonary:     Effort: Pulmonary effort is normal.  Musculoskeletal:     Right lower leg: No edema.     Left lower leg: No edema.  Neurological:     Mental Status: She is alert.  Psychiatric:        Mood and Affect: Mood normal.        Behavior: Behavior normal.        Thought Content: Thought content normal.    Review of Systems  Respiratory: Positive for shortness of breath.   Gastrointestinal: Positive for abdominal pain.     Lab Results  Component Value Date   HGBA1C 6.3 04/14/2020   Vitals:   04/16/20 1023  BP: (!) 152/82  Pulse: 72  SpO2: 98%    Lipid Panel     Component Value Date/Time   CHOL 163 08/20/2019 1129   TRIG 55 08/20/2019 1129   HDL 44 08/20/2019 1129   CHOLHDL 3.7 08/20/2019 1129   CHOLHDL 4.3 09/15/2017 1217   VLDL 13 09/15/2017 1217   LDLCALC 108 (H) 08/20/2019 1129   A/P: Patient  with pre-diabetes and obesity, prescribed Ozempic by Dr. Gwendlyn Deutscher arrives with supply of this new prescription. Patient educated on purpose, proper use and potential adverse effects of Ozempic.  Following instruction patient verbalized understanding and demonstrated proper injection technique. Plan to start with Ozempic 0.25mg  once weekly and titrate to goal dose based on patient response.   Hypertension is currently uncontrolled despite reported medication adherence, and frequent exercise. If additional agents are added in the future, consider alpha blockers like terazosin or doxazosin in this patient with multiple medication intolerances (ACE/ARB class, amlodipine and clonidine- angioedema). Patient plans to check and document blood pressure at home and will be seen by a cardiologist in the near future.  Tobacco cessation - quit 08/2017 Congratulated on success and updated problem list to history of tobacco abuse.   Written patient instructions provided.  Total time in face to face counseling 25 minutes.   Follow up PCP Clinic Visit in 1 week.  Please schedule patient return to Rx clinic prn for additional med adjustment.   Patient seen with Mercy Riding, PharmD, PGY1 Acute Care Pharmacy Resident and Lorel Monaco, PharmD, PGY-2 Ambulatory Care Pharmacy Resident.

## 2020-04-21 ENCOUNTER — Ambulatory Visit: Payer: 59 | Admitting: Family Medicine

## 2020-05-01 ENCOUNTER — Ambulatory Visit (INDEPENDENT_AMBULATORY_CARE_PROVIDER_SITE_OTHER): Payer: 59 | Admitting: Family Medicine

## 2020-05-01 ENCOUNTER — Other Ambulatory Visit: Payer: Self-pay

## 2020-05-01 ENCOUNTER — Encounter: Payer: Self-pay | Admitting: Family Medicine

## 2020-05-01 DIAGNOSIS — Z8781 Personal history of (healed) traumatic fracture: Secondary | ICD-10-CM

## 2020-05-01 DIAGNOSIS — I10 Essential (primary) hypertension: Secondary | ICD-10-CM

## 2020-05-01 DIAGNOSIS — Z8719 Personal history of other diseases of the digestive system: Secondary | ICD-10-CM | POA: Insufficient documentation

## 2020-05-01 LAB — CUP PACEART REMOTE DEVICE CHECK
Date Time Interrogation Session: 20211101235500
Implantable Pulse Generator Implant Date: 20190325

## 2020-05-01 MED ORDER — CARVEDILOL 25 MG PO TABS: 25.0000 mg | ORAL_TABLET | Freq: Two times a day (BID) | ORAL | 1 refills | Status: DC
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
## 2020-05-01 NOTE — Assessment & Plan Note (Signed)
Hiatal hernia, 1cm in size was found on her 2018 EGD. This is too small to be symptomatic. As discussed with her, we could trial PPI for her symptoms. She will like to defer starting PPI for now since her epigastric discomfort is intermittent. Consider referral back to GI for re-scoping in the future. F/U as needed.

## 2020-05-01 NOTE — Assessment & Plan Note (Signed)
BP poorly controlled. Will go up on her Coreg to 25 mg BID. Monitor home BP closely. F/U in 4 weeks for reassessment.

## 2020-05-01 NOTE — Patient Instructions (Signed)

## 2020-05-01 NOTE — Progress Notes (Signed)
Asked by Dr. Gwendlyn Deutscher to review Ozempic treatment plan.  Patient currently on 0.25mg  subcutaneously weekly - started 04/16/20.  Patient experienced slight nausea at initiation of medication but has since subsided.  Overall, the patient says she has been tolerating the medication well.  She is not on any insulin therapy at baseline.  Denies hypoglycemic events, however she states she sometimes feels dizzy but this has been going on for some time.  Advised patient to carry peppermint candies with her to chew or drink orange juice if dizziness occurs to help raise her blood sugar.   Patient does not check her blood sugar currently.   Patient endorses multiple doses (~3) left in her current Ozempic pen.  Patient did not endorse any issues with getting the medication from the pharmacy.    Patient educated on purpose, proper use and potential adverse effects of Ozempic.  Will continue Ozempic at current dose of 0.25 mg weekly.  Patient Following instruction patient verbalized understanding of treatment plan.

## 2020-05-01 NOTE — Assessment & Plan Note (Signed)
S/P ORIF. 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 ?? Some element of neuropathic pain. We discussed referral for NSC/EMG. Per the patient, she had discussed referral to neuro with her hand specialist and they are planning on placing referral for this. As discussed with her, she will let me know if she need me to place her referral for [facility]. Unable to return to PT at this time due to cost. Continue Ibuprofen as needed.

## 2020-05-04 ENCOUNTER — Ambulatory Visit (INDEPENDENT_AMBULATORY_CARE_PROVIDER_SITE_OTHER): Payer: 59

## 2020-05-04 DIAGNOSIS — I639 Cerebral infarction, unspecified: Secondary | ICD-10-CM | POA: Diagnosis not present

## 2020-05-04 NOTE — Progress Notes (Signed)
[facility] Summary Report / Loop Recorder 

## 2020-05-05 ENCOUNTER — Ambulatory Visit: Payer: 59 | Admitting: Cardiovascular Disease

## 2020-05-12 ENCOUNTER — Encounter: Payer: Self-pay | Admitting: Family Medicine

## 2020-05-14 ENCOUNTER — Encounter: Payer: Self-pay | Admitting: Family Medicine

## 2020-05-14 ENCOUNTER — Telehealth: Payer: Self-pay

## 2020-05-14 NOTE — Telephone Encounter (Signed)
She can continue her current dose and we will discuss dose adjust ment at her next appointment with me on 05/26/20

## 2020-05-14 NOTE — Telephone Encounter (Signed)
Pt called to see if she needs to start a higher dose after she takes her last shot next week? She currently is taking 0.25mg  of Ozempit please call to advise [phone]

## 2020-05-14 NOTE — Telephone Encounter (Signed)
Patient voiced understanding.  Danecia Underdown,CMA ? ?

## 2020-05-14 NOTE — Telephone Encounter (Signed)
Will forward to MD to advise on next steps.  Patient is scheduled for follow up on 05/26/2020.  Ashlen Kiger,CMA

## 2020-05-25 ENCOUNTER — Encounter: Payer: Self-pay | Admitting: Family Medicine

## 2020-05-25 ENCOUNTER — Telehealth: Payer: Self-pay

## 2020-05-25 NOTE — Telephone Encounter (Signed)
Contacted patient regarding Ozempic treatment plan.  Currently she has been taking Ozempic 0.25mg  weekly since 04/16/2020.  Patient denies any GI upset and has not noticed changes in her appetite, however does endorse some neck stiffness at time of Ozempic administration that persists throughout the day.  Encouraged patient to try administration in different positions and to monitor for signs of improvement or worsening.    At last visit, patient endorsed some dizziness.  Encouraged patient at that time to carry peppermint candies with her.  She states she purchased some to keep with her and has not had to use them.    Since patient has been tolerating Ozempic 0.25mg  weekly for several weeks - increase to Ozempic 0.5mg  weekly to help with appetite.   Patient educated on purpose, proper use and potential adverse effects of Ozempic 0.5mg  weekly.  Following instruction patient verbalized understanding of treatment plan.   Dimple Nanas, PharmD PGY-1 Acute Care Pharmacy Resident 05/25/2020 11:12 AM

## 2020-05-26 ENCOUNTER — Ambulatory Visit: Payer: 59 | Admitting: Family Medicine

## 2020-06-07 LAB — CUP PACEART REMOTE DEVICE CHECK
Date Time Interrogation Session: 20211204225846
Implantable Pulse Generator Implant Date: 20190325

## 2020-06-08 ENCOUNTER — Ambulatory Visit (INDEPENDENT_AMBULATORY_CARE_PROVIDER_SITE_OTHER): Payer: 59

## 2020-06-08 ENCOUNTER — Other Ambulatory Visit: Payer: Self-pay | Admitting: *Deleted

## 2020-06-08 DIAGNOSIS — I10 Essential (primary) hypertension: Secondary | ICD-10-CM

## 2020-06-08 DIAGNOSIS — I639 Cerebral infarction, unspecified: Secondary | ICD-10-CM | POA: Diagnosis not present

## 2020-06-08 MED ORDER — SPIRONOLACTONE 50 MG PO TABS: 50.0000 mg | ORAL_TABLET | Freq: Every day | ORAL | 0 refills | Status: DC

## 2020-06-12 ENCOUNTER — Encounter: Payer: Self-pay | Admitting: Cardiovascular Disease

## 2020-06-12 ENCOUNTER — Ambulatory Visit (INDEPENDENT_AMBULATORY_CARE_PROVIDER_SITE_OTHER): Payer: 59 | Admitting: Cardiovascular Disease

## 2020-06-12 ENCOUNTER — Other Ambulatory Visit: Payer: Self-pay

## 2020-06-12 VITALS — BP 168/88 | HR 78 | Ht 65.0 in | Wt 207.4 lb

## 2020-06-12 DIAGNOSIS — I5032 Chronic diastolic (congestive) heart failure: Secondary | ICD-10-CM

## 2020-06-12 DIAGNOSIS — R0683 Snoring: Secondary | ICD-10-CM

## 2020-06-12 DIAGNOSIS — N182 Chronic kidney disease, stage 2 (mild): Secondary | ICD-10-CM

## 2020-06-12 DIAGNOSIS — I10 Essential (primary) hypertension: Secondary | ICD-10-CM | POA: Diagnosis not present

## 2020-06-12 DIAGNOSIS — R400 Somnolence: Secondary | ICD-10-CM | POA: Diagnosis not present

## 2020-06-12 DIAGNOSIS — I739 Peripheral vascular disease, unspecified: Secondary | ICD-10-CM | POA: Diagnosis not present

## 2020-06-12 DIAGNOSIS — Z5181 Encounter for therapeutic drug level monitoring: Secondary | ICD-10-CM

## 2020-06-12 HISTORY — DX: Peripheral vascular disease, unspecified: I73.9

## 2020-06-12 MED ORDER — HYDROCHLOROTHIAZIDE 25 MG PO TABS
25.0000 mg | ORAL_TABLET | Freq: Every day | ORAL | 3 refills | Status: DC
Start: 2020-06-12 — End: 2020-07-17

## 2020-06-12 NOTE — Patient Instructions (Addendum)
Medication Instructions:  START HYDROCHLOROTHIAZIDE 25 MG DAILY    Labwork: BMET IN 1 WEEK    Testing/Procedures: Your physician has requested that you have a renal artery duplex. During this test, an ultrasound is used to evaluate blood flow to the kidneys. Allow one hour for this exam. Do not eat after midnight the day before and avoid carbonated beverages. Take your medications as you usually do.  Your physician has requested that you have a lower extremity arterial exercise duplex. During this test, exercise and ultrasound are used to evaluate arterial blood flow in the legs. Allow one hour for this exam. There are no restrictions or special instructions.  Your physician has recommended that you have a sleep study. This test records several body functions during sleep, including: brain activity, eye movement, oxygen and carbon dioxide blood levels, heart rate and rhythm, breathing rate and rhythm, the flow of air through your mouth and nose, snoring, body muscle movements, and chest and belly movement. ONCE INSURANCE HAS APPROVED THE OFFICE WILL CALL YOU TO SCHEDULE. IF YOU DO NOT HEAR FROM THE OFFICE IN 2 WEEKS CALL TO FOLLOW UP    Follow-Up: 1 MONTH WITH PHARM D FOR BLOOD PRESSURE   DR [place] IN 4 MONTHS    You will receive a phone call from the PREP exercise and nutrition program to schedule an initial assessment.   Special Instructions:   MONITOR YOUR BLOOD PRESSURE TWICE A DAY, LOG IN THE BOOK PROVIDED. BRING THE BOOK AND YOUR BLOOD PRESSURE MACHINE TO YOUR FOLLOW UP IN 1 MONTH   WILL HAVE GAREGUIDE REACH OUT TO YU REGARDING TRANSPORTATION   DASH Eating Plan DASH stands for "Dietary Approaches to Stop Hypertension." The DASH eating plan is a healthy eating plan that has been shown to reduce high blood pressure (hypertension). It may also reduce your risk for type 2 diabetes, heart disease, and stroke. The DASH eating plan may also help with weight loss. What are tips for  following this plan?  General guidelines  Avoid eating more than 2,300 mg (milligrams) of salt (sodium) a day. If you have hypertension, you may need to reduce your sodium intake to 1,500 mg a day.  Limit alcohol intake to no more than 1 drink a day for nonpregnant women and 2 drinks a day for men. One drink equals 12 oz of beer, 5 oz of wine, or 1 oz of hard liquor.  Work with your health care provider to maintain a healthy body weight or to lose weight. Ask what an ideal weight is for you.  Get at least 30 minutes of exercise that causes your heart to beat faster (aerobic exercise) most days of the week. Activities may include walking, swimming, or biking.  Work with your health care provider or diet and nutrition specialist (dietitian) to adjust your eating plan to your individual calorie needs. Reading food labels   Check food labels for the amount of sodium per serving. Choose foods with less than 5 percent of the Daily Value of sodium. Generally, foods with less than 300 mg of sodium per serving fit into this eating plan.  To find whole grains, look for the word "whole" as the first word in the ingredient list. Shopping  Buy products labeled as "low-sodium" or "no salt added."  Buy fresh foods. Avoid canned foods and premade or frozen meals. Cooking  Avoid adding salt when cooking. Use salt-free seasonings or herbs instead of table salt or sea salt. Check with your health  care provider or pharmacist before using salt substitutes.  Do not fry foods. Cook foods using healthy methods such as baking, boiling, grilling, and broiling instead.  Cook with heart-healthy oils, such as olive, canola, soybean, or sunflower oil. Meal planning  Eat a balanced diet that includes: ? 5 or more servings of fruits and vegetables each day. At each meal, try to fill half of your plate with fruits and vegetables. ? Up to 6-8 servings of whole grains each day. ? Less than 6 oz of lean meat,  poultry, or fish each day. A 3-oz serving of meat is about the same size as a deck of cards. One egg equals 1 oz. ? 2 servings of low-fat dairy each day. ? A serving of nuts, seeds, or beans 5 times each week. ? Heart-healthy fats. Healthy fats called Omega-3 fatty acids are found in foods such as flaxseeds and coldwater fish, like sardines, salmon, and mackerel.  Limit how much you eat of the following: ? Canned or prepackaged foods. ? Food that is high in trans fat, such as fried foods. ? Food that is high in saturated fat, such as fatty meat. ? Sweets, desserts, sugary drinks, and other foods with added sugar. ? Full-fat dairy products.  Do not salt foods before eating.  Try to eat at least 2 vegetarian meals each week.  Eat more home-cooked food and less restaurant, buffet, and fast food.  When eating at a restaurant, ask that your food be prepared with less salt or no salt, if possible. What foods are recommended? The items listed may not be a complete list. Talk with your dietitian about what dietary choices are best for you. Grains Whole-grain or whole-wheat bread. Whole-grain or whole-wheat pasta. Brown rice. [place]. Bulgur. Whole-grain and low-sodium cereals. Pita bread. Low-fat, low-sodium crackers. Whole-wheat flour tortillas. Vegetables Fresh or frozen vegetables (raw, steamed, roasted, or grilled). Low-sodium or reduced-sodium tomato and vegetable juice. Low-sodium or reduced-sodium tomato sauce and tomato paste. Low-sodium or reduced-sodium canned vegetables. Fruits All fresh, dried, or frozen fruit. Canned fruit in natural juice (without added sugar). Meat and other protein foods Skinless chicken or [place]. Ground chicken or [place]. Pork with fat trimmed off. Fish and seafood. Egg whites. Dried beans, peas, or lentils. Unsalted nuts, nut butters, and seeds. Unsalted canned beans. Lean cuts of beef with fat trimmed off. Low-sodium, lean deli meat. Dairy Low-fat  (1%) or fat-free (skim) milk. Fat-free, low-fat, or reduced-fat cheeses. Nonfat, low-sodium ricotta or cottage cheese. Low-fat or nonfat yogurt. Low-fat, low-sodium cheese. Fats and oils Soft margarine without trans fats. Vegetable oil. Low-fat, reduced-fat, or light mayonnaise and salad dressings (reduced-sodium). Canola, safflower, olive, soybean, and sunflower oils. Avocado. Seasoning and other foods Herbs. Spices. Seasoning mixes without salt. Unsalted popcorn and pretzels. Fat-free sweets. What foods are not recommended? The items listed may not be a complete list. Talk with your dietitian about what dietary choices are best for you. Grains Baked goods made with fat, such as croissants, muffins, or some breads. Dry pasta or rice meal packs. Vegetables Creamed or fried vegetables. Vegetables in a cheese sauce. Regular canned vegetables (not low-sodium or reduced-sodium). Regular canned tomato sauce and paste (not low-sodium or reduced-sodium). Regular tomato and vegetable juice (not low-sodium or reduced-sodium). Angie Fava. Olives. Fruits Canned fruit in a light or heavy syrup. Fried fruit. Fruit in cream or butter sauce. Meat and other protein foods Fatty cuts of meat. Ribs. Fried meat. Berniece Salines. Sausage. Bologna and other processed lunch meats. Salami. Fatback.  Hotdogs. Bratwurst. Salted nuts and seeds. Canned beans with added salt. Canned or smoked fish. Whole eggs or egg yolks. Chicken or [place] with skin. Dairy Whole or 2% milk, cream, and half-and-half. Whole or full-fat cream cheese. Whole-fat or sweetened yogurt. Full-fat cheese. Nondairy creamers. Whipped toppings. Processed cheese and cheese spreads. Fats and oils Butter. Stick margarine. Lard. Shortening. Ghee. Bacon fat. Tropical oils, such as coconut, palm kernel, or palm oil. Seasoning and other foods Salted popcorn and pretzels. Onion salt, garlic salt, seasoned salt, table salt, and sea salt. Worcestershire sauce. Tartar sauce.  Barbecue sauce. Teriyaki sauce. Soy sauce, including reduced-sodium. Steak sauce. Canned and packaged gravies. Fish sauce. Oyster sauce. Cocktail sauce. Horseradish that you find on the shelf. Ketchup. Mustard. Meat flavorings and tenderizers. Bouillon cubes. Hot sauce and Tabasco sauce. Premade or packaged marinades. Premade or packaged taco seasonings. Relishes. Regular salad dressings. Where to find more information:  National Heart, Lung, and [place]: https://wilson-eaton.com/  American Heart Association: www.heart.org Summary  The DASH eating plan is a healthy eating plan that has been shown to reduce high blood pressure (hypertension). It may also reduce your risk for type 2 diabetes, heart disease, and stroke.  With the DASH eating plan, you should limit salt (sodium) intake to 2,300 mg a day. If you have hypertension, you may need to reduce your sodium intake to 1,500 mg a day.  When on the DASH eating plan, aim to eat more fresh fruits and vegetables, whole grains, lean proteins, low-fat dairy, and heart-healthy fats.  Work with your health care provider or diet and nutrition specialist (dietitian) to adjust your eating plan to your individual calorie needs. This information is not intended to replace advice given to you by your health care provider. Make sure you discuss any questions you have with your health care provider. Document Released: 06/02/2011 Document Revised: 05/26/2017 Document Reviewed: 06/06/2016 Elsevier Patient Education  2020 [employer].
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
## 2020-06-15 ENCOUNTER — Telehealth: Payer: Self-pay | Admitting: Family Medicine

## 2020-06-15 ENCOUNTER — Telehealth: Payer: Self-pay | Admitting: Licensed Clinical Social Worker

## 2020-06-15 ENCOUNTER — Telehealth: Payer: Self-pay

## 2020-06-15 NOTE — Progress Notes (Signed)
Heart and Vascular Care Navigation  06/15/2020  Teresa [name] Jan 18, 1961 [id number]  Reason for Referral:  Transportation challenges                                                                                                    Assessment:  CSW spoke with pt via telephone at [phone] after receiving referral from [facility], LPN for Dr. Oval Linsey, that pt may have upcoming challenges w/ transportation to appointments. Introduced self, role, reason for call. Confirmed pt indeed having challenges w/ transportation. She has had access to her daughter's car and will have no issues with getting to her appointment tomorrow at [facility] w/ Dr. Gwendlyn Deutscher. She gave permission for this Probation officer to send a referral to [employer]. Confirmed home address, PCP, emergency contacts and phone number. Pt also took down my name and number and the number for [employer]. She understands that the [employer] office will give her a call to go over waiver form.   CSW inquired about any additional needs at this time, including rent/utilties. Pt shared that she "has a lot going on." But that she had applied for assistance through [place]. CSW let pt know to give me a call if she has any additional questions or needs that may arise.                                        HRT/VAS Care Coordination    Patients Home Cardiology Office [place] arrangements for the past 2 months Single Family Home   Lives with: Relatives   Patient Current Librarian, academic   Patient Has Concern With Paying Medical Bills No   Does Patient Have Prescription Coverage? Yes   Home Assistive Devices/Equipment Eyeglasses   DME [place] History:                                                                             SDOH Screenings   Alcohol Screen: Not on file  Depression (PHQ2-9): Low  Risk   . PHQ-2 Score: 2  Financial Resource Strain: Not on file  Food Insecurity: Not on file  Housing: Not on file  Physical Activity: Not on file  Social Connections: Not on file  Stress: Not on file  Tobacco Use: Medium Risk  . Smoking Tobacco Use: Former Smoker  . Smokeless Tobacco Use: Never Used  Transportation Needs: Unmet Transportation Needs  . Lack of Transportation (Medical): Yes  . Lack of Transportation (Non-Medical): Yes    SDOH Interventions: Housing Insecurity:   Pt shares she has already applied for assistance through DSS.  Transportation:   Transportation Interventions: Financial planner   Follow-up plan:   Referral sent to [employer], no additional needs identified a this time by pt. Pt has my contact information for any additional needs.

## 2020-06-15 NOTE — Telephone Encounter (Signed)
Call placed to patient reference referral to [place] program and can do a daytime/anytime class Will need assist with transport Agreeable to a T/TH class starting 06/30/20 1p-215pm Intake scheduled for 06/24/20 at 11am Texted time of appt to pt. Home address for transport  [address] [place] [address]

## 2020-06-15 NOTE — Telephone Encounter (Signed)
   Teresa [name] DOB: 10-07-1960 MRN: [id number]   Teresa [name]  For purposes of improving physical access to our facilities, Teresa [name] is pleased to partner with third parties to provide Teresa [name] patients or other authorized individuals the option of convenient, on-demand ground transportation services (the [place]") through use of the technology service that enables users to request on-demand ground transportation from independent third-party providers.  By opting to use and accept these Teresa [name], I, the undersigned, hereby agree on behalf of myself, and on behalf of any minor child using the Teresa [name] for whom I am the parent or legal guardian, as follows:  1. Government social research officer provided to me are provided by independent third-party transportation providers who are not [employer] or employees and who are unaffiliated with Teresa [name]. 2. Teresa [name] is neither a transportation carrier nor a common or public carrier. 3. Teresa [name] has no control over the quality or safety of the transportation that occurs as a result of the Teresa [name]. 4. Teresa [name] cannot guarantee that any third-party transportation provider will complete any arranged transportation service. 5. Teresa [name] makes no representation, warranty, or guarantee regarding the reliability, timeliness, quality, safety, suitability, or availability of any of the Transport Services or that they will be error free. 6. I fully understand that traveling by vehicle involves risks and dangers of serious bodily injury, including permanent disability, paralysis, and death. I agree, on behalf of myself and on behalf of any minor child using the Transport Services for whom I am the parent or legal guardian, that the entire risk arising out of my use of the Teresa [name] remains solely with me, to the maximum extent permitted under applicable law. 7. The Teresa [name] are provided "as is" and "as available." Teresa [name] disclaims all representations and warranties, express, implied or statutory, not expressly set out in these terms, including the implied warranties of merchantability and fitness for a particular purpose. 8. I hereby waive and release Teresa [name], its agents, employees, officers, directors, representatives, insurers, attorneys, assigns, successors, subsidiaries, and affiliates from any and all past, present, or future claims, demands, liabilities, actions, causes of action, or suits of any kind directly or indirectly arising from acceptance and use of the Teresa [name]. 9. I further waive and release Teresa [name] and its affiliates from all present and future [name] and responsibility for any injury or death to persons or damages to property caused by or related to the use of the Teresa [name]. 10. I have read this Waiver and Release of [name], and I understand the terms used in it and their legal significance. This Waiver is freely and voluntarily given with the understanding that my right (as well as the right of any minor child for whom I am the parent or legal guardian using the Teresa [name]) to legal recourse against  in connection with the Teresa [name] is knowingly surrendered in return for use of these services.   I attest that I read the consent document to Teresa [name], gave Teresa [name] the opportunity to ask questions and answered the questions asked (if any). I affirm that Teresa [name] then provided consent for she's participation in this program.     Teresa [name]

## 2020-06-16 ENCOUNTER — Encounter: Payer: Self-pay | Admitting: Family Medicine

## 2020-06-16 ENCOUNTER — Ambulatory Visit: Payer: 59 | Admitting: Family Medicine

## 2020-06-16 ENCOUNTER — Other Ambulatory Visit: Payer: Self-pay

## 2020-06-16 VITALS — BP 134/68 | HR 88 | Wt 204.2 lb

## 2020-06-16 DIAGNOSIS — N189 Chronic kidney disease, unspecified: Secondary | ICD-10-CM | POA: Diagnosis not present

## 2020-06-16 DIAGNOSIS — E669 Obesity, unspecified: Secondary | ICD-10-CM | POA: Diagnosis not present

## 2020-06-16 DIAGNOSIS — H9202 Otalgia, left ear: Secondary | ICD-10-CM

## 2020-06-16 DIAGNOSIS — I10 Essential (primary) hypertension: Secondary | ICD-10-CM | POA: Diagnosis not present

## 2020-06-16 DIAGNOSIS — R252 Cramp and spasm: Secondary | ICD-10-CM

## 2020-06-16 DIAGNOSIS — N182 Chronic kidney disease, stage 2 (mild): Secondary | ICD-10-CM

## 2020-06-16 MED ORDER — OZEMPIC (0.25 OR 0.5 MG/DOSE) 2 MG/1.5ML ~~LOC~~ SOPN: 0.5000 mg | PEN_INJECTOR | SUBCUTANEOUS | 1 refills | Status: DC

## 2020-06-16 MED ORDER — ZOSTER VAC RECOMB ADJUVANTED 50 MCG/0.5ML IM SUSR: 0.5000 mL | Freq: Once | INTRAMUSCULAR | 1 refills | Status: AC

## 2020-06-16 NOTE — Assessment & Plan Note (Signed)
Recently seen by her cardiologist.  Stable from cardiac standpoint.

## 2020-06-16 NOTE — Patient Instructions (Signed)
Zoster Vaccine, Recombinant injection What is this medicine? ZOSTER VACCINE (ZOS ter vak SEEN) is used to prevent shingles in adults 58 years old and over. This vaccine is not used to treat shingles or nerve pain from shingles. This medicine may be used for other purposes; ask your health care provider or pharmacist if you have questions. COMMON BRAND NAME(S): SHINGRIX What should I tell my health care provider before I take this medicine? They need to know if you have any of these conditions:  blood disorders or disease  cancer like leukemia or lymphoma  immune system problems or therapy  an unusual or allergic reaction to vaccines, other medications, foods, dyes, or preservatives  pregnant or trying to get pregnant  breast-feeding How should I use this medicine? This vaccine is for injection in a muscle. It is given by a health care professional. Talk to your pediatrician regarding the use of this medicine in children. This medicine is not approved for use in children. Overdosage: If you think you have taken too much of this medicine contact a poison control center or emergency room at once. NOTE: This medicine is only for you. Do not share this medicine with others. What if I miss a dose? Keep appointments for follow-up (booster) doses as directed. It is important not to miss your dose. Call your doctor or health care professional if you are unable to keep an appointment. What may interact with this medicine?  medicines that suppress your immune system  medicines to treat cancer  steroid medicines like prednisone or cortisone This list may not describe all possible interactions. Give your health care provider a list of all the medicines, herbs, non-prescription drugs, or dietary supplements you use. Also tell them if you smoke, drink alcohol, or use illegal drugs. Some items may interact with your medicine. What should I watch for while using this medicine? Visit your doctor for  regular check ups. This vaccine, like all vaccines, may not fully protect everyone. What side effects may I notice from receiving this medicine? Side effects that you should report to your doctor or health care professional as soon as possible:  allergic reactions like skin rash, itching or hives, swelling of the face, lips, or tongue  breathing problems Side effects that usually do not require medical attention (report these to your doctor or health care professional if they continue or are bothersome):  chills  headache  fever  nausea, vomiting  redness, warmth, pain, swelling or itching at site where injected  tiredness This list may not describe all possible side effects. Call your doctor for medical advice about side effects. You may report side effects to FDA at 1-800-FDA-1088. Where should I keep my medicine? This vaccine is only given in a clinic, pharmacy, doctor's office, or other health care setting and will not be stored at home. NOTE: This sheet is a summary. It may not cover all possible information. If you have questions about this medicine, talk to your doctor, pharmacist, or health care provider.  2020 Elsevier/Gold Standard (2017-01-23 13:20:30)  

## 2020-06-16 NOTE — Progress Notes (Signed)
SUBJECTIVE:   CHIEF COMPLAINT / HPI:   HTN: Was started on HCTZ by her cardiologists recently in addition to Coreg 25 mg BID, Hydralazine 100 mg TID, Aldactone 50 mg qd. She denies any concerns today.   Weight management: She is compliant with Ozempic 0.5 mg weekly. She was referred to the PREP weight loss program and her first appointment is on the 29th Dec. She endorses occasional blurry vision with ozempic, but uncertain if it is truly related to the medicine.  Right ear discomfort: C/O intermittent cracking sensation in her right ears. No discharge, no hearing loss, no pain. No recent ENT for a while. No other concerns.  Calf pain Left: C/O intermittent knee swelling with pain as well as calf pain since she had ankle fracture in 2016. However, in the last few weeks, her calf pain is becoming more frequent. Pain improves with messaging. She also uses Ibuprofen as needed. Her cardiologist also ordered carotid doppler of her LL to further evaluate.  CKDII: Here for f/u.  Also mentioned chronic right forearm and finger pain as well as her left middle finger triggered joint. Not changed from her baseline.   PERTINENT  PMH / PSH: PMX reviewed.  OBJECTIVE:   BP 134/68   Pulse 88   Wt 204 lb 3.2 oz (92.6 kg)   SpO2 99%   BMI 33.98 kg/m   Physical Exam Vitals and nursing note reviewed.  HENT:     Right Ear: Tympanic membrane, ear canal and external ear normal. There is no impacted cerumen.     Left Ear: Tympanic membrane, ear canal and external ear normal. There is no impacted cerumen.  Cardiovascular:     Rate and Rhythm: Regular rhythm.     Heart sounds: Normal heart sounds. No murmur heard.   Pulmonary:     Effort: Pulmonary effort is normal. No respiratory distress.     Breath sounds: Normal breath sounds. No wheezing or rhonchi.  Abdominal:     General: Bowel sounds are normal. There is no distension.     Palpations: Abdomen is soft. There is no mass.     Tenderness:  There is no abdominal tenderness.  Musculoskeletal:     Right forearm: No swelling.     Left forearm: No swelling.     Right lower leg: Normal. No swelling or tenderness. No edema.     Left lower leg: Normal. No swelling or tenderness. No edema.     Comments:  No calf swelling or tenderness B/L. Well healed linear surgical scar on the ventral surface of her right forearm. ROM adequate across all joints.      ASSESSMENT/PLAN:   Essential hypertension BP looks good on current regimen. I reviewed her record from her recent visit to the cardiologists. Renal US ordered by cards to assess for RAS. Obtain U/S as planned. Continue current BP regiment with close home BP monitoring. F/U in 3 months.  Obese She is compliant with her Ozempic and diet. She [facility] I refilled her Ozempic. Monitor weight loss closely.  Ear discomfort Right ear discomfort. Ear exam benign. ENT referral offered. She declined for now due to financial constraint.  Monitor for now. F/U as needed.   CKD (chronic kidney disease), stage II Caution with Ibuprofen use discussed. Monitor closely now that she was started on HCTZ. Bmet checked today. Monitor closely.   Calf pain: Currently asymptomatic ?? Myalgia Vs. Myositis Vs. PVD. Cardiologist ordered  doppler US. Will consider ABI in the future if this persists. Muscle relaxant prn   HM: Shingrix discussed and was escribed to her pharmacy.  [name], MD [place]

## 2020-06-16 NOTE — Assessment & Plan Note (Signed)
BP looks good on current regimen. I reviewed her record from her recent visit to the cardiologists. Renal US ordered by cards to assess for RAS. Obtain U/S as planned. Continue current BP regiment with close home BP monitoring. F/U in 3 months.

## 2020-06-16 NOTE — Assessment & Plan Note (Addendum)
Right ear discomfort. Ear exam benign. ENT referral offered. She declined for now due to financial constraint.  Monitor for now. F/U as needed.

## 2020-06-16 NOTE — Assessment & Plan Note (Signed)
Caution with Ibuprofen use discussed. Monitor closely now that she was started on HCTZ. Bmet checked today. Monitor closely.

## 2020-06-16 NOTE — Assessment & Plan Note (Signed)
She is compliant with her Ozempic and diet. She recently enrolled in the exercise program at the [facility] I refilled her Ozempic. Monitor weight loss closely.

## 2020-06-17 ENCOUNTER — Telehealth: Payer: Self-pay | Admitting: Cardiovascular Disease

## 2020-06-17 LAB — MAGNESIUM: Magnesium: 2 mg/dL (ref 1.6–2.3)

## 2020-06-17 LAB — BASIC METABOLIC PANEL
BUN/Creatinine Ratio: 16 (ref 9–23)
BUN: 17 mg/dL (ref 6–24)
CO2: 23 mmol/L (ref 20–29)
Calcium: 10.5 mg/dL — ABNORMAL HIGH (ref 8.7–10.2)
Chloride: 100 mmol/L (ref 96–106)
Creatinine, Ser: 1.07 mg/dL — ABNORMAL HIGH (ref 0.57–1.00)
GFR calc Af Amer: 66 mL/min/{1.73_m2} (ref >59)
GFR calc non Af Amer: 57 mL/min/{1.73_m2} — ABNORMAL LOW (ref >59)
Glucose: 91 mg/dL (ref 65–99)
Potassium: 4.4 mmol/L (ref 3.5–5.2)
Sodium: 138 mmol/L (ref 134–144)

## 2020-06-17 NOTE — Telephone Encounter (Signed)
06/17/20 9:20am Auth pended; Ref # [id number]

## 2020-06-18 ENCOUNTER — Telehealth: Payer: Self-pay

## 2020-06-18 ENCOUNTER — Encounter: Payer: Self-pay | Admitting: Family Medicine

## 2020-06-18 NOTE — Telephone Encounter (Signed)
Set up transport via [facility] for [facility] intake appt 06/24/20  time 11am at [facility]/return home 12N.

## 2020-06-22 ENCOUNTER — Telehealth: Payer: Self-pay | Admitting: Licensed Clinical Social Worker

## 2020-06-22 NOTE — Telephone Encounter (Signed)
Pt called this Probation officer to inquire if her nephew who recently had a procedure would be able to take [place] to his upcoming appointments. CSW explained process for calling and assisting nephew with the waiver for his appointment. Pt shared she has an appointment tomorrow at [facility] and will take transportation there. CSW explained that they should call her to confirm the day before the ride however it may be best to confirm when she calls to assist her nephew today.   Offered if she should have any additional issues to give this Probation officer a call back.  Remain available as needed for any additional questions/needs.   Westley Hummer, MSW, [place]  [phone]

## 2020-06-22 NOTE — Progress Notes (Signed)
[facility] Summary Report / Loop Recorder 

## 2020-06-23 NOTE — Telephone Encounter (Signed)
Teresa [name] with [facility] Clinical Support Team is calling to follow up regarding the status of the authorization for polysomnography. He states has been approved for 06/17/20 - 09/15/20.   Phone #: [phone] Fax#: [phone]

## 2020-06-24 ENCOUNTER — Telehealth: Payer: Self-pay | Admitting: *Deleted

## 2020-06-24 NOTE — Telephone Encounter (Signed)
Patient is scheduled for lab study on 08/06/20. Patient understands her sleep study will be done at [facility] sleep lab. Patient understands she will receive a sleep packet in a week or so. Patient understands to call if she does not receive the sleep packet in a timely manner. Patient agrees with treatment and thanked me for call.  

## 2020-06-24 NOTE — Progress Notes (Signed)
[facility] PRE[facility] Progress Report   Patient Details  Name: Teresa [name] MRN: [id number] Date of Birth: 1960-08-26 Age: 59 y.o. PCP: Doreene Eland, MD  Vitals:   06/24/20 1539  BP: (!) 142/80  Pulse: 80  SpO2: 97%  Weight: 202 lb 3.2 oz (91.7 kg)  Height: 5\' 5"  (1.651 m)      [facility] Eval - 06/24/20 1500      Referral    Referring Provider 06/26/20    Reason for referral Hypertension    Program Start Date 07/14/20   T/TH 1p/215pm x 12 wks     Measurement   Waist Circumference 43.5 inches    Hip Circumference 44.5 inches    Body fat 42.9 percent      Information for Trainer   Goals Control HTN, Control Pain, wants to be healthy for grandbaby    Current Exercise walking occ    Orthopedic Concerns leg pain, right arm s/p fx and surgery    Pertinent Medical History HTN GERD CKD2 stroke, prediabetic, hypothyroid denies CHF    Medications that affect exercise Beta blocker;Asthma inhaler;Medication causing dizziness/drowsiness      Timed Up and Go (TUGS)   Timed Up and Go Low risk <9 seconds      Mobility and Daily Activities   I find it easy to walk up or down two or more flights of stairs. 2    I have no trouble taking out the trash. 4    I do housework such as vacuuming and dusting on my own without difficulty. 3    I can easily lift a gallon of milk (8lbs). 4    I can easily walk a mile. 2    I have no trouble reaching into high cupboards or reaching down to pick up something from the floor. 4    I do not have trouble doing out-door work such as 03-22-1973, raking leaves, or gardening. 1      Mobility and Daily Activities   I feel younger than my age. 4    I feel independent. 4    I feel energetic. 1    I live an active life.  1    I feel strong. 2    I feel healthy. 1    I feel active as other people my age. 2      How fit and strong are you.   Fit and Strong Total Score 35          Past Medical History:  Diagnosis Date  . Allergy   .  Anemia   . Cervical disc disorder with radiculopathy of cervical region 02/10/2016  . Chronic lower back pain   . Claudication in peripheral vascular disease (HCC) 06/12/2020  . Diverticulosis   . EKG, abnormal 01/06/2017  . Gastritis   . GERD (gastroesophageal reflux disease)   . H/O hiatal hernia   . Hiatal hernia   . History of amputation of lesser toe of left foot (HCC) 03/11/2015  . History of colon cancer   . Hypertension   . Hyperthyroidism    hx  . Hypothyroidism   . Lateral knee pain, left 08/06/2013  . Left shoulder pain 05/28/2015  . Migraine headache    "a few times/yr" (03/05/2015)  . Neck pain 02/11/2014  . Neck strain 02/06/2012  . Numbness and tingling in right hand 05/16/2018  . Osteomyelitis (HCC)    05/18/2018 03/05/2015  . Osteomyelitis of toe of left foot (HCC)  03/05/2015  . Palpitations 01/05/2016  . Positive TB test   . Stroke (HCC)   . Throat pain 09/21/2011   Normal CT and US neck march 2013, repeat Neck US in 1 year to follow-up small nodules.  Head CT 2013: normal ? glosspharyngeal neuralgia.  On amitriptyline await [facility] ENt referral.   . Tubular adenoma of colon   . Tubular adenoma of colon   . Ulnar neuropathy at elbow of right upper extremity 06/12/2018   Past Surgical History:  Procedure Laterality Date  . ABDOMINAL HYSTERECTOMY  2006  . AMPUTATION TOE Left 03/05/2015   Procedure: AMPUTATION LEFT 5TH  TOE;  Surgeon: Toni Arthurs, MD;  Location: [facility] OR;  Service: Orthopedics;  Laterality: Left;  . APPENDECTOMY  2006  . BREAST BIOPSY Left 11/26/2014  . CESAREAN SECTION  1979 1986 1990  . COLONOSCOPY    . GALLBLADDER SURGERY    . LOOP RECORDER INSERTION N/A 09/18/2017   Procedure: LOOP RECORDER INSERTION;  Surgeon: Marinus Maw, MD;  Location: [facility] INVASIVE CV LAB;  Service: Cardiovascular;  Laterality: N/A;  . OPEN REDUCTION INTERNAL FIXATION (ORIF) DISTAL RADIAL FRACTURE Right 09/14/2019   Procedure: OPEN REDUCTION INTERNAL FIXATION (ORIF)  RADIAL AND ULNA  FRACTURE;  Surgeon: Bradly Bienenstock, MD;  Location: [facility] OR;  Service: Orthopedics;  Laterality: Right;  . ORIF ULNAR FRACTURE Right 09/14/2019  . TEE WITHOUT CARDIOVERSION N/A 09/18/2017   Procedure: TRANSESOPHAGEAL ECHOCARDIOGRAM (TEE);  Surgeon: Chrystie Nose, MD;  Location: [facility] ENDOSCOPY;  Service: Cardiovascular;  Laterality: N/A;  . TOE AMPUTATION Left 03/05/2015   5th toe  . TUBAL LIGATION Bilateral 1990  . UPPER GASTROINTESTINAL ENDOSCOPY     Social History   Tobacco Use  Smoking Status Former Smoker  . Packs/day: 0.50  . Years: 36.00  . Pack years: 18.00  . Types: Cigarettes  . Quit date: 09/23/2017  . Years since quitting: 2.7  Smokeless Tobacco Never Used  Tobacco Comment   Tobacco info given 03/01/17      Bonnye Fava 06/24/2020, 3:44 PM

## 2020-06-24 NOTE — Telephone Encounter (Signed)
06/17/20 9:20am Auth pended; Ref # [id number]  Jorge Ny with [facility] Clinical Support Team is calling to follow up regarding the status of the authorization for polysomnography. He states has been approved for 06/17/20 - 09/15/20.   Phone #: [phone] Fax#: [phone]

## 2020-06-24 NOTE — Telephone Encounter (Signed)
Patient is scheduled for lab study on 08/06/20. Patient understands her sleep study will be done at [facility] sleep lab. Patient understands she will receive a sleep packet in a week or so. Patient understands to call if she does not receive the sleep packet in a timely manner. Patient agrees with treatment and thanked me for call.

## 2020-07-01 NOTE — Telephone Encounter (Signed)
Case # [id number]; valid through 06/17/20 - 09/15/20

## 2020-07-03 ENCOUNTER — Other Ambulatory Visit: Payer: Self-pay | Admitting: Family Medicine

## 2020-07-06 ENCOUNTER — Ambulatory Visit (HOSPITAL_COMMUNITY)
Admission: RE | Admit: 2020-07-06 | Discharge: 2020-07-06 | Disposition: A | Discharge status: Home / Self Care | Payer: 59 | Source: Ambulatory Visit | Attending: Cardiology | Admitting: Cardiology

## 2020-07-06 ENCOUNTER — Other Ambulatory Visit: Payer: Self-pay | Admitting: Cardiovascular Disease

## 2020-07-06 ENCOUNTER — Other Ambulatory Visit: Payer: Self-pay

## 2020-07-06 DIAGNOSIS — I10 Essential (primary) hypertension: Secondary | ICD-10-CM | POA: Diagnosis present

## 2020-07-06 DIAGNOSIS — I739 Peripheral vascular disease, unspecified: Secondary | ICD-10-CM

## 2020-07-08 ENCOUNTER — Other Ambulatory Visit: Payer: Self-pay | Admitting: Family Medicine

## 2020-07-09 ENCOUNTER — Encounter: Payer: Self-pay | Admitting: Family Medicine

## 2020-07-10 ENCOUNTER — Telehealth: Payer: Self-pay

## 2020-07-10 NOTE — Telephone Encounter (Signed)
Call to patient reference start date change for PREP to 08/03/20 M/W 10am-1115a. Agreeable to starting then.

## 2020-07-13 ENCOUNTER — Ambulatory Visit (INDEPENDENT_AMBULATORY_CARE_PROVIDER_SITE_OTHER): Payer: 59

## 2020-07-13 DIAGNOSIS — I639 Cerebral infarction, unspecified: Secondary | ICD-10-CM | POA: Diagnosis not present

## 2020-07-14 ENCOUNTER — Encounter: Payer: Self-pay | Admitting: Family Medicine

## 2020-07-15 LAB — CUP PACEART REMOTE DEVICE CHECK
Date Time Interrogation Session: 20220115235551
Implantable Pulse Generator Implant Date: 20190325

## 2020-07-17 ENCOUNTER — Encounter: Payer: Self-pay | Admitting: Pharmacist Clinician (PhC)/ Clinical Pharmacy Specialist

## 2020-07-17 ENCOUNTER — Other Ambulatory Visit: Payer: Self-pay

## 2020-07-17 ENCOUNTER — Ambulatory Visit (HOSPITAL_COMMUNITY)
Admission: RE | Admit: 2020-07-17 | Discharge: 2020-07-17 | Disposition: A | Discharge status: Home / Self Care | Payer: 59 | Source: Ambulatory Visit | Attending: Cardiology | Admitting: Cardiology

## 2020-07-17 ENCOUNTER — Ambulatory Visit (INDEPENDENT_AMBULATORY_CARE_PROVIDER_SITE_OTHER): Payer: 59 | Admitting: Pharmacist Clinician (PhC)/ Clinical Pharmacy Specialist

## 2020-07-17 VITALS — BP 148/76 | HR 78 | Ht 65.0 in | Wt 203.0 lb

## 2020-07-17 DIAGNOSIS — I10 Essential (primary) hypertension: Secondary | ICD-10-CM

## 2020-07-17 DIAGNOSIS — I739 Peripheral vascular disease, unspecified: Secondary | ICD-10-CM | POA: Diagnosis present

## 2020-07-17 MED ORDER — CHLORTHALIDONE 25 MG PO TABS
25.0000 mg | ORAL_TABLET | Freq: Every day | ORAL | 3 refills | Status: DC
Start: 2020-07-17 — End: 2021-08-03

## 2020-07-17 NOTE — Patient Instructions (Signed)
Return for a a follow up appointment Thursday February 17 at 9:30 am  Go to the lab in 2 weeks to check kidney function  Check your blood pressure at home daily and keep record of the readings.  Take your BP meds as follows:  Stop hydrochlorothiazide and start chlorthalidone 25 mg once daily in the mornings.    Continue with all other medications  Bring all of your meds, your BP cuff and your record of home blood pressures to your next appointment.  Exercise as youre able, try to walk approximately 30 minutes per day.  Keep salt intake to a minimum, especially watch canned and prepared boxed foods.  Eat more fresh fruits and vegetables and fewer canned items.  Avoid eating in fast food restaurants.    HOW TO TAKE YOUR BLOOD PRESSURE:  Rest 5 minutes before taking your blood pressure.   Dont smoke or drink caffeinated beverages for at least 30 minutes before.  Take your blood pressure before (not after) you eat.  Sit comfortably with your back supported and both feet on the floor (dont cross your legs).  Elevate your arm to heart level on a table or a desk.  Use the proper sized cuff. It should fit smoothly and snugly around your bare upper arm. There should be enough room to slip a fingertip under the cuff. The bottom edge of the cuff should be 1 inch above the crease of the elbow.  Ideally, take 3 measurements at one sitting and record the average.

## 2020-07-17 NOTE — Progress Notes (Signed)
07/17/2020 Tela Byland 1961-04-20 [id number]   HPI:  Teresa [name] is a 60 y.o. female patient of Dr Oval Linsey, with a [place] below who presents today for hypertension clinic evaluation.  At that visit her pressure was noted to be 168/88.  At that time hydrochlorothiazide 25 mg daily was added to her regimen.   She was also enrolled in the PREP exercise program, and is scheduled for her first class next month.  Screening for secondary causes did not yield anything significant.    She returns today for follow up.  She did not bring any home readings with her, but notes that she recently purchased a new cuff on [place] because of the varying readings her older cuff was giving.  She also had lower extremity dopplers this morning and preliminary results show no issues with claudication.   She notes that she's a little worried this morning, as her daughter went to the hospital this morning at [redacted] weeks pregnant, with elevated blood pressure.    Past Medical History: hyperlipidemia 2/21 LDL 108, not able to tolerate multiple statin drugs  Chronic low back pain Has muscle relaxants that she uses prn  hypothyroidism TSH 10/21 2.47 -Currently on levothyroxine 0.175 mg, monitored by PCP  stroke Cryptogenic 08/2017  [facility] Sleep study determined no need for CPAP at this time     Blood Pressure Goal:  130/80  Current Medications: carvedilol 25 mg bid, hydralazine 100 mg tid, spironolactone 50 mg qd, hctz 25 mg  Family Hx: mother died 2 years ago, at 52 had CABG x 2; father had 2 strokes died at 77 also aneurysm); brother with hypertension led to kidney disease now on dialysis, 1 sister with DM1; 1 daughter preeclampisa; son doesn't see MDs, other daugher on low dose BP meds  Social Hx: quit tobacco with stroke 3 years ago; no alcohol; no caffeine  Diet: drinks only water; mostly home cooked meals; only air fries, no oil; mostly chicken and fish; loves vegetables - seasons with turmeric; frozen not canned;  avoids sweets  Exercise: develop pain in left calf, had to stop; was walking up to several miles per day; walked on broken leg for severl amonths  Home BP readings: recently purchased new cuff from [employer].  Notes morning readings mostly in the 130's, however well into the 140's most evenings.    Intolerances: amlodipine - edema, clonidine - edema, lisinopril - angiodemena  Labs: 12/21:  Na 138, K 4.4, Glu 91, BUN 17, SCr 1.07, GFR 66  Wt Readings from Last 3 Encounters:  07/17/20 203 lb (92.1 kg)  06/24/20 202 lb 3.2 oz (91.7 kg)  06/16/20 204 lb 3.2 oz (92.6 kg)   BP Readings from Last 3 Encounters:  07/17/20 (!) 148/76  06/24/20 (!) 142/80  06/16/20 134/68   Pulse Readings from Last 3 Encounters:  07/17/20 78  06/24/20 80  06/16/20 88    Current Outpatient Medications  Medication Sig Dispense Refill  . aspirin 81 MG chewable tablet Chew 1 tablet (81 mg total) by mouth daily. 30 tablet 0  . B Complex Vitamins (VITAMIN B COMPLEX PO) Take 1 tablet by mouth daily.    . Carboxymethylcellulose Sodium (EYE DROPS OP) Place 1 drop into both eyes See admin instructions. Place 1-2 drops into both eyes three to six times a day    . carvedilol (COREG) 25 MG tablet Take 1 tablet (25 mg total) by mouth 2 (two) times daily with a meal. 180 tablet 1  .  chlorthalidone (HYGROTON) 25 MG tablet Take 1 tablet (25 mg total) by mouth daily. 90 tablet 3  . hydrALAZINE (APRESOLINE) 100 MG tablet TAKE 1 TABLET BY MOUTH THREE TIMES DAILY 270 tablet 1  . levothyroxine (SYNTHROID) 175 MCG tablet Take 1 tablet (175 mcg total) by mouth every morning. 30 minutes before food 90 tablet 1  . Probiotic Product (PROBIOTIC DAILY PO) Take 1 tablet by mouth.    . Semaglutide,0.25 or 0.5MG /DOS, (OZEMPIC, 0.25 OR 0.5 MG/DOSE,) 2 MG/1.5ML SOPN Inject 0.5 mg into the skin every 7 (seven) weeks. 1.5 mL 1  . spironolactone (ALDACTONE) 50 MG tablet Take 1 tablet (50 mg total) by mouth daily. 90 tablet 0  . tiZANidine  (ZANAFLEX) 2 MG tablet Take 1 tablet (2 mg total) by mouth every 8 (eight) hours as needed for up to 60 doses for muscle spasms. 30 tablet 0  . VITAMIN D PO Take 1 tablet by mouth daily.     . Multiple Vitamins-Minerals (CENTRUM MULTIGUMMIES) CHEW Chew 2 tablets by mouth daily. (Patient not taking: Reported on 06/16/2020)     Current Facility-Administered Medications  Medication Dose Route Frequency Provider Last Rate Last Admin  . olopatadine (PATANOL) 0.1 % ophthalmic solution 1 drop  1 drop Both Eyes BID McDiarmid, Blane Ohara, MD        Allergies  Allergen Reactions  . Amlodipine Swelling    Leg swelling  . Bactrim [Sulfamethoxazole-Trimethoprim] Anaphylaxis  . Clonidine Derivatives Swelling and Other (See Comments)    Facial swelling  . Lisinopril Swelling    Lip swelling  . Statins Swelling    Body and muscle aches and face/lip swelling on Statins (Lipitor, Pravachol)  . Aspirin Nausea Only    Doses higher than 81mg  only  . Latex Rash    NO POWDERED GLOVES!!    Past Medical History:  Diagnosis Date  . Allergy   . Anemia   . Cervical disc disorder with radiculopathy of cervical region 02/10/2016  . Chronic lower back pain   . Claudication in peripheral vascular disease ([place]) 06/12/2020  . Diverticulosis   . EKG, abnormal 01/06/2017  . Gastritis   . GERD (gastroesophageal reflux disease)   . H/O hiatal hernia   . Hiatal hernia   . History of amputation of lesser toe of left foot ([place]) 03/11/2015  . History of colon cancer   . Hypertension   . Hyperthyroidism    hx  . Hypothyroidism   . Lateral knee pain, left 08/06/2013  . Left shoulder pain 05/28/2015  . Migraine headache    "a few times/yr" (03/05/2015)  . Neck pain 02/11/2014  . Neck strain 02/06/2012  . Numbness and tingling in right hand 05/16/2018  . Osteomyelitis ([place])    [name] 03/05/2015  . Osteomyelitis of toe of left foot ([place]) 03/05/2015  . Palpitations 01/05/2016  . Positive TB test   . Stroke ([place])   . Throat  pain 09/21/2011   Normal CT and US neck march 2013, repeat Neck US in 1 year to follow-up small nodules.  Head CT 2013: normal ? glosspharyngeal neuralgia.  On amitriptyline await [facility] ENt referral.   . Tubular adenoma of colon   . Tubular adenoma of colon   . Ulnar neuropathy at elbow of right upper extremity 06/12/2018    Blood pressure (!) 148/76, pulse 78, height 5\' 5"  (1.651 m), weight 203 lb (92.1 kg).  Essential hypertension Patient with essential hypertension, currently not well controlled.  She has been working hard on  lifestyle modifications and hopes to get out walking a mile or more each day now that she knows no problems with circulation in her leg.  She hopes to go out daily and push stroller for long walks once her granddaughter arrives in the next couple of weeks.  She is hesitant to take any more medications, so will try switching her hydrochlorothiazide out for chlorthalidone 25 mg daily.  She will continue to monitor her BP at home and bring both her medication list and new BP cuff to follow up appointment in one month.     Tommy Medal PharmD CPP [facility] [address] [place], [place] [address] [phone]

## 2020-07-17 NOTE — Assessment & Plan Note (Signed)
Patient with essential hypertension, currently not well controlled.  She has been working hard on lifestyle modifications and hopes to get out walking a mile or more each day now that she knows no problems with circulation in her leg.  She hopes to go out daily and push stroller for long walks once her granddaughter arrives in the next couple of weeks.  She is hesitant to take any more medications, so will try switching her hydrochlorothiazide out for chlorthalidone 25 mg daily.  She will continue to monitor her BP at home and bring both her medication list and new BP cuff to follow up appointment in one month.

## 2020-07-27 NOTE — Progress Notes (Signed)
[facility] Summary Report / Loop Recorder 

## 2020-07-29 ENCOUNTER — Telehealth: Payer: Self-pay

## 2020-07-29 NOTE — Telephone Encounter (Signed)
Called to confirm PREP start date of 08/03/20 10am to 1115am.  May need to change start date, will let me know.

## 2020-08-06 ENCOUNTER — Encounter (HOSPITAL_BASED_OUTPATIENT_CLINIC_OR_DEPARTMENT_OTHER): Payer: 59 | Admitting: Cardiovascular Disease

## 2020-08-13 ENCOUNTER — Other Ambulatory Visit: Payer: Self-pay

## 2020-08-13 ENCOUNTER — Ambulatory Visit (INDEPENDENT_AMBULATORY_CARE_PROVIDER_SITE_OTHER): Payer: 59 | Admitting: Pharmacist

## 2020-08-13 VITALS — BP 128/76 | HR 78 | Resp 16 | Ht 65.0 in | Wt 207.0 lb

## 2020-08-13 DIAGNOSIS — I10 Essential (primary) hypertension: Secondary | ICD-10-CM

## 2020-08-13 LAB — BASIC METABOLIC PANEL
BUN/Creatinine Ratio: 14 (ref 9–23)
BUN: 16 mg/dL (ref 6–24)
CO2: 21 mmol/L (ref 20–29)
Calcium: 10.1 mg/dL (ref 8.7–10.2)
Chloride: 100 mmol/L (ref 96–106)
Creatinine, Ser: 1.18 mg/dL — ABNORMAL HIGH (ref 0.57–1.00)
GFR calc Af Amer: 59 mL/min/{1.73_m2} — ABNORMAL LOW (ref >59)
GFR calc non Af Amer: 51 mL/min/{1.73_m2} — ABNORMAL LOW (ref >59)
Glucose: 114 mg/dL — ABNORMAL HIGH (ref 65–99)
Potassium: 4 mmol/L (ref 3.5–5.2)
Sodium: 137 mmol/L (ref 134–144)

## 2020-08-13 NOTE — Patient Instructions (Addendum)
Return for a  follow up appointment in 5 weeks  Go to the lab in [place] your blood pressure at home daily (if able) and keep record of the readings.  Take your BP meds as follows: *NO MEDICATION CHANGE*  Bring all of your meds, your BP cuff and your record of home blood pressures to your next appointment.  Exercise as you're able, try to walk approximately 30 minutes per day.  Keep salt intake to a minimum, especially watch canned and prepared boxed foods.  Eat more fresh fruits and vegetables and fewer canned items.  Avoid eating in fast food restaurants.    HOW TO TAKE YOUR BLOOD PRESSURE: . Rest 5 minutes before taking your blood pressure. .  Don't smoke or drink caffeinated beverages for at least 30 minutes before. . Take your blood pressure before (not after) you eat. . Sit comfortably with your back supported and both feet on the floor (don't cross your legs). . Elevate your arm to heart level on a table or a desk. . Use the proper sized cuff. It should fit smoothly and snugly around your bare upper arm. There should be enough room to slip a fingertip under the cuff. The bottom edge of the cuff should be 1 inch above the crease of the elbow. . Ideally, take 3 measurements at one sitting and record the average.

## 2020-08-13 NOTE — Progress Notes (Signed)
HPI:  Teresa [name] is a 60 y.o. female patient of Dr Oval Linsey, with a PMH below who presents today for hypertension clinic evaluation. She was also enrolled in the PREP exercise program. Screening for secondary causes did not yield anything significant.  During last f/u we changed HCTZ to chlorthalidone 25mg  daily She returns today for follow up and reports some episodes of pain in the last few days, but in general she feels well and no significant changes noted. Today reports pain of 4/10 during appointment.  Past Medical History: hyperlipidemia 2/21 LDL 108, not able to tolerate multiple statin drugs  Chronic low back pain Has muscle relaxants that she uses prn  hypothyroidism TSH 10/21 2.47 -Currently on levothyroxine 0.175 mg, monitored by PCP  stroke Cryptogenic 08/2017  [facility] Sleep study determined no need for CPAP at this time     Blood Pressure Goal:  130/80  Current Medications:  carvedilol 25 mg twice daily  hydralazine 100 mg three times daily  spironolactone 50 mg daily  chlorthalidone 25 mg daily  Family Hx: mother died 2 years ago, at 97 had CABG x 2; father had 2 strokes died at 86 also aneurysm); brother with hypertension led to kidney disease now on dialysis, 1 sister with DM1; 1 daughter preeclampisa; son doesn't see MDs, other daugher on low dose BP meds  Social Hx: quit tobacco with stroke 3 years ago; no alcohol; no caffeine  Diet: drinks only water; mostly home cooked meals; only air fries, no oil; mostly chicken and fish; loves vegetables - seasons with turmeric; frozen not canned; avoids sweets  Exercise: develop pain in left calf, had to stop; was walking up to several miles per day; walked on broken leg for severl amonths  Home BP readings: Home BP cuff (arm) accurate with < 44mmHg different from manual reading. 9 readings, average 134/81, HR range 67-81 bpm  Intolerances: amlodipine - edema, clonidine - edema, lisinopril - angiodemena  Labs: 12/21:  Na  138, K 4.4, Glu 91, BUN 17, SCr 1.07, GFR 66  Wt Readings from Last 3 Encounters:  08/13/20 207 lb (93.9 kg)  07/17/20 203 lb (92.1 kg)  06/24/20 202 lb 3.2 oz (91.7 kg)   BP Readings from Last 3 Encounters:  08/13/20 128/76  07/17/20 (!) 148/76  06/24/20 (!) 142/80   Pulse Readings from Last 3 Encounters:  08/13/20 78  07/17/20 78  06/24/20 80    Current Outpatient Medications  Medication Sig Dispense Refill  . aspirin 81 MG chewable tablet Chew 1 tablet (81 mg total) by mouth daily. 30 tablet 0  . B Complex Vitamins (VITAMIN B COMPLEX PO) Take 1 tablet by mouth daily.    . Carboxymethylcellulose Sodium (EYE DROPS OP) Place 1 drop into both eyes See admin instructions. Place 1-2 drops into both eyes three to six times a day    . carvedilol (COREG) 25 MG tablet Take 1 tablet (25 mg total) by mouth 2 (two) times daily with a meal. 180 tablet 1  . chlorthalidone (HYGROTON) 25 MG tablet Take 1 tablet (25 mg total) by mouth daily. 90 tablet 3  . hydrALAZINE (APRESOLINE) 100 MG tablet TAKE 1 TABLET BY MOUTH THREE TIMES DAILY 270 tablet 1  . levothyroxine (SYNTHROID) 175 MCG tablet Take 1 tablet (175 mcg total) by mouth every morning. 30 minutes before food 90 tablet 1  . Probiotic Product (PROBIOTIC DAILY PO) Take 1 tablet by mouth.    . Semaglutide,0.25 or 0.5MG /DOS, (OZEMPIC, 0.25 OR  0.5 MG/DOSE,) 2 MG/1.5ML SOPN Inject 0.5 mg into the skin every 7 (seven) weeks. 1.5 mL 1  . spironolactone (ALDACTONE) 50 MG tablet Take 1 tablet (50 mg total) by mouth daily. 90 tablet 0  . tiZANidine (ZANAFLEX) 2 MG tablet Take 1 tablet (2 mg total) by mouth every 8 (eight) hours as needed for up to 60 doses for muscle spasms. 30 tablet 0  . VITAMIN D PO Take 1 tablet by mouth daily.      Current Facility-Administered Medications  Medication Dose Route Frequency Provider Last Rate Last Admin  . olopatadine (PATANOL) 0.1 % ophthalmic solution 1 drop  1 drop Both Eyes BID McDiarmid, Blane Ohara, MD         Allergies  Allergen Reactions  . Amlodipine Swelling    Leg swelling  . Bactrim [Sulfamethoxazole-Trimethoprim] Anaphylaxis  . Clonidine Derivatives Swelling and Other (See Comments)    Facial swelling  . Lisinopril Swelling    Lip swelling  . Statins Swelling    Body and muscle aches and face/lip swelling on Statins (Lipitor, Pravachol)  . Aspirin Nausea Only    Doses higher than 81mg  only  . Latex Rash    NO POWDERED GLOVES!!    Past Medical History:  Diagnosis Date  . Allergy   . Anemia   . Cervical disc disorder with radiculopathy of cervical region 02/10/2016  . Chronic lower back pain   . Claudication in peripheral vascular disease ([place]) 06/12/2020  . Diverticulosis   . EKG, abnormal 01/06/2017  . Gastritis   . GERD (gastroesophageal reflux disease)   . H/O hiatal hernia   . Hiatal hernia   . History of amputation of lesser toe of left foot ([place]) 03/11/2015  . History of colon cancer   . Hypertension   . Hyperthyroidism    hx  . Hypothyroidism   . Lateral knee pain, left 08/06/2013  . Left shoulder pain 05/28/2015  . Migraine headache    "a few times/yr" (03/05/2015)  . Neck pain 02/11/2014  . Neck strain 02/06/2012  . Numbness and tingling in right hand 05/16/2018  . Osteomyelitis ([place])    [name] 03/05/2015  . Osteomyelitis of toe of left foot ([place]) 03/05/2015  . Palpitations 01/05/2016  . Positive TB test   . Stroke ([place])   . Throat pain 09/21/2011   Normal CT and US neck march 2013, repeat Neck US in 1 year to follow-up small nodules.  Head CT 2013: normal ? glosspharyngeal neuralgia.  On amitriptyline await P4[facility] ENt referral.   . Tubular adenoma of colon   . Tubular adenoma of colon   . Ulnar neuropathy at elbow of right upper extremity 06/12/2018    Blood pressure 128/76, pulse 78, resp. rate 16, height 5\' 5"  (1.651 m), weight 207 lb (93.9 kg), SpO2 97 %.  Essential hypertension Blood pressure at goal during OV. Patient reports compliance with current  therapy and denies problems. Will continue all medication as prescribed and follow up in 5 weeks.  Kupono Marling Rodriguez-Guzman PharmD, BCPS, [place] [address] 08/18/2020 3:57 PM

## 2020-08-14 ENCOUNTER — Other Ambulatory Visit: Payer: Self-pay | Admitting: Family Medicine

## 2020-08-14 DIAGNOSIS — Z1231 Encounter for screening mammogram for malignant neoplasm of breast: Secondary | ICD-10-CM

## 2020-08-14 LAB — CUP PACEART REMOTE DEVICE CHECK
Date Time Interrogation Session: 20220217235909
Implantable Pulse Generator Implant Date: 20190325

## 2020-08-17 ENCOUNTER — Encounter: Payer: Self-pay | Admitting: Family Medicine

## 2020-08-17 ENCOUNTER — Ambulatory Visit (INDEPENDENT_AMBULATORY_CARE_PROVIDER_SITE_OTHER): Payer: 59

## 2020-08-17 DIAGNOSIS — I639 Cerebral infarction, unspecified: Secondary | ICD-10-CM

## 2020-08-18 NOTE — Assessment & Plan Note (Signed)
Blood pressure at goal during OV. Patient reports compliance with current therapy and denies problems. Will continue all medication as prescribed and follow up in 5 weeks.

## 2020-08-20 NOTE — Progress Notes (Signed)
[facility] Summary Report / Loop Recorder 

## 2020-09-10 ENCOUNTER — Other Ambulatory Visit: Payer: Self-pay | Admitting: Family Medicine

## 2020-09-10 DIAGNOSIS — I10 Essential (primary) hypertension: Secondary | ICD-10-CM

## 2020-09-15 ENCOUNTER — Encounter: Payer: Self-pay | Admitting: Family Medicine

## 2020-09-15 DIAGNOSIS — I10 Essential (primary) hypertension: Secondary | ICD-10-CM

## 2020-09-17 ENCOUNTER — Ambulatory Visit: Payer: 59

## 2020-09-20 LAB — CUP PACEART REMOTE DEVICE CHECK
Date Time Interrogation Session: 20220323010050
Implantable Pulse Generator Implant Date: 20190325

## 2020-09-21 ENCOUNTER — Ambulatory Visit (INDEPENDENT_AMBULATORY_CARE_PROVIDER_SITE_OTHER): Payer: 59

## 2020-09-21 ENCOUNTER — Ambulatory Visit: Payer: 59

## 2020-09-21 DIAGNOSIS — I639 Cerebral infarction, unspecified: Secondary | ICD-10-CM

## 2020-09-23 ENCOUNTER — Ambulatory Visit: Payer: 59 | Admitting: Family Medicine

## 2020-09-25 ENCOUNTER — Ambulatory Visit (INDEPENDENT_AMBULATORY_CARE_PROVIDER_SITE_OTHER): Payer: 59

## 2020-09-25 ENCOUNTER — Encounter: Payer: Self-pay | Admitting: Family Medicine

## 2020-09-25 ENCOUNTER — Other Ambulatory Visit (HOSPITAL_COMMUNITY): Payer: Self-pay | Admitting: Family Medicine

## 2020-09-25 ENCOUNTER — Ambulatory Visit: Payer: 59 | Admitting: Family Medicine

## 2020-09-25 ENCOUNTER — Ambulatory Visit (INDEPENDENT_AMBULATORY_CARE_PROVIDER_SITE_OTHER): Payer: 59 | Admitting: Family Medicine

## 2020-09-25 ENCOUNTER — Other Ambulatory Visit: Payer: Self-pay

## 2020-09-25 VITALS — BP 143/85 | HR 65 | Ht 65.0 in | Wt 201.4 lb

## 2020-09-25 DIAGNOSIS — M25512 Pain in left shoulder: Secondary | ICD-10-CM | POA: Diagnosis not present

## 2020-09-25 DIAGNOSIS — E662 Morbid (severe) obesity with alveolar hypoventilation: Secondary | ICD-10-CM | POA: Insufficient documentation

## 2020-09-25 DIAGNOSIS — M542 Cervicalgia: Secondary | ICD-10-CM

## 2020-09-25 DIAGNOSIS — E669 Obesity, unspecified: Secondary | ICD-10-CM

## 2020-09-25 DIAGNOSIS — I10 Essential (primary) hypertension: Secondary | ICD-10-CM | POA: Diagnosis not present

## 2020-09-25 DIAGNOSIS — R7303 Prediabetes: Secondary | ICD-10-CM | POA: Diagnosis not present

## 2020-09-25 DIAGNOSIS — Z23 Encounter for immunization: Secondary | ICD-10-CM

## 2020-09-25 DIAGNOSIS — I5032 Chronic diastolic (congestive) heart failure: Secondary | ICD-10-CM

## 2020-09-25 DIAGNOSIS — G8929 Other chronic pain: Secondary | ICD-10-CM

## 2020-09-25 MED ORDER — SPIRONOLACTONE 50 MG PO TABS: 50.0000 mg | ORAL_TABLET | Freq: Every day | ORAL | 0 refills | Status: DC

## 2020-09-25 MED ORDER — OZEMPIC (0.25 OR 0.5 MG/DOSE) 2 MG/1.5ML ~~LOC~~ SOPN: 0.5000 mg | PEN_INJECTOR | SUBCUTANEOUS | 0 refills | Status: DC

## 2020-09-25 MED ORDER — CARVEDILOL 25 MG PO TABS: 25.0000 mg | ORAL_TABLET | Freq: Two times a day (BID) | ORAL | 0 refills | Status: DC

## 2020-09-25 MED ORDER — NAPROXEN 500 MG PO TABS: 500.0000 mg | ORAL_TABLET | Freq: Two times a day (BID) | ORAL | 0 refills | Status: DC

## 2020-09-25 MED ORDER — NAPROXEN 500 MG PO TBEC: 500.0000 mg | DELAYED_RELEASE_TABLET | Freq: Two times a day (BID) | ORAL | 0 refills | Status: DC | PRN

## 2020-09-25 NOTE — Assessment & Plan Note (Signed)
She has f/u with Cards this months she said. Dong well otherwise.

## 2020-09-25 NOTE — Assessment & Plan Note (Signed)
Unable to afford meds. She just got on disability. We provided indigent funding for her medication to [place], Aldactone Continue home BP monitoring. F/U in 2 weeks for reassessment.

## 2020-09-25 NOTE — Assessment & Plan Note (Signed)
Ozempic sample provided.

## 2020-09-25 NOTE — Patient Instructions (Signed)

## 2020-09-25 NOTE — Progress Notes (Signed)
    SUBJECTIVE:   CHIEF COMPLAINT / HPI:    Arm pain: here to f/u for her left hand, arm, shoulder, and neck pain. Her middle finger sometimes locks up on her. Pain radiates from her hand to her left shoulder and neck and sometimes vice versa. She denies numbness or tingling. No recent trauma or heavy lifting. Symptoms worsened over the last 3 weeks.  HTN/CHF:  Here for f/u. She has been unable to afford her Aldactone and has been off for a week. She takes Coreg daily instead of BID to preserve her medication. Her home BP measures/data despite rationing meds ranges between 115/70s to 140s/80. No other concerns.   Weight/PreDM: Out of Ozempic for > 1 week.  PERTINENT  PMH / PSH: PMX reviewed  OBJECTIVE:   BP (!) 143/85 Comment: Left arm  Pulse 65   Ht 5\' 5"  (1.651 m)   Wt 201 lb 6 oz (91.3 kg)   SpO2 97%   BMI 33.51 kg/m    Physical Exam Vitals and nursing note reviewed.  Cardiovascular:     Rate and Rhythm: Normal rate and regular rhythm.     Heart sounds: Normal heart sounds. No murmur heard.   Pulmonary:     Effort: No respiratory distress.     Breath sounds: Normal breath sounds. No wheezing.  Abdominal:     General: Bowel sounds are normal. There is no distension.     Palpations: Abdomen is soft. There is no mass.     Tenderness: There is no abdominal tenderness.  Musculoskeletal:     Left shoulder: No swelling or crepitus.     Left hand: No swelling or deformity. Normal range of motion.     Cervical back: Neck supple.     Right lower leg: No edema.     Left lower leg: No edema.     Comments: Mild left wrist tenderness Limited left shoulder ROM due to pain.  Neurological:     Sensory: No sensory deficit.      ASSESSMENT/PLAN:  Neck pain/Left shoulder, arm and hand pain: I reviewed CT neck done: Straightening of the cervical spine. + Osteophytes ?? Cervical nerve impingement Xray of her neck and shoulder ordered for reassessment with her worsening  symptoms. Home exercise discussed. PT referral in the future. Start Naproxen prn. F/U soon if there is no improvement.  Essential hypertension Unable to afford meds. She just got on disability. We provided indigent funding for her medication to [employer], Aldactone Continue home BP monitoring. F/U in 2 weeks for reassessment.  Prediabetes I gave Ozempic sample  Obese Ozempic sample provided.  Chronic diastolic CHF (congestive heart failure) ([place]) She has f/u with Cards this months she said. Dong well otherwise.     [name], MD [place]

## 2020-09-25 NOTE — Assessment & Plan Note (Signed)
I gave Ozempic sample

## 2020-09-26 ENCOUNTER — Other Ambulatory Visit (HOSPITAL_COMMUNITY): Payer: Self-pay

## 2020-09-28 ENCOUNTER — Other Ambulatory Visit (HOSPITAL_COMMUNITY): Payer: Self-pay

## 2020-09-28 MED FILL — Spironolactone Tab 50 MG: ORAL | 90 days supply | Qty: 90 | Fill #0 | Status: CN

## 2020-09-28 MED FILL — Naproxen Tab 500 MG: ORAL | 30 days supply | Qty: 60 | Fill #0 | Status: CN

## 2020-09-28 MED FILL — Semaglutide Soln Pen-inj 0.25 or 0.5 MG/DOSE (2 MG/1.5ML): SUBCUTANEOUS | 28 days supply | Qty: 1.5 | Fill #0 | Status: CN

## 2020-09-28 MED FILL — Carvedilol Tab 25 MG: ORAL | 90 days supply | Qty: 180 | Fill #0 | Status: CN

## 2020-09-29 ENCOUNTER — Other Ambulatory Visit (HOSPITAL_COMMUNITY): Payer: Self-pay

## 2020-10-05 ENCOUNTER — Inpatient Hospital Stay: Admission: RE | Admit: 2020-10-05 | Payer: 59 | Source: Ambulatory Visit

## 2020-10-05 NOTE — Progress Notes (Signed)
[facility] Summary Report / Loop Recorder 

## 2020-10-07 ENCOUNTER — Other Ambulatory Visit (HOSPITAL_COMMUNITY): Payer: Self-pay

## 2020-10-08 ENCOUNTER — Ambulatory Visit: Payer: 59

## 2020-10-19 ENCOUNTER — Ambulatory Visit (INDEPENDENT_AMBULATORY_CARE_PROVIDER_SITE_OTHER): Payer: Self-pay

## 2020-10-19 ENCOUNTER — Ambulatory Visit (INDEPENDENT_AMBULATORY_CARE_PROVIDER_SITE_OTHER): Payer: 59 | Admitting: Cardiovascular Disease

## 2020-10-19 ENCOUNTER — Ambulatory Visit (HOSPITAL_COMMUNITY)
Admission: RE | Admit: 2020-10-19 | Discharge: 2020-10-19 | Disposition: A | Discharge status: Home / Self Care | Payer: 59 | Source: Ambulatory Visit | Attending: Family Medicine | Admitting: Family Medicine

## 2020-10-19 ENCOUNTER — Encounter: Payer: Self-pay | Admitting: Cardiovascular Disease

## 2020-10-19 ENCOUNTER — Other Ambulatory Visit: Payer: Self-pay

## 2020-10-19 VITALS — BP 156/72 | HR 78 | Ht 65.0 in | Wt 196.6 lb

## 2020-10-19 DIAGNOSIS — E662 Morbid (severe) obesity with alveolar hypoventilation: Secondary | ICD-10-CM | POA: Diagnosis not present

## 2020-10-19 DIAGNOSIS — I10 Essential (primary) hypertension: Secondary | ICD-10-CM

## 2020-10-19 DIAGNOSIS — E7800 Pure hypercholesterolemia, unspecified: Secondary | ICD-10-CM

## 2020-10-19 DIAGNOSIS — M25512 Pain in left shoulder: Secondary | ICD-10-CM | POA: Diagnosis present

## 2020-10-19 DIAGNOSIS — G8929 Other chronic pain: Secondary | ICD-10-CM | POA: Diagnosis present

## 2020-10-19 DIAGNOSIS — M542 Cervicalgia: Secondary | ICD-10-CM | POA: Diagnosis present

## 2020-10-19 DIAGNOSIS — I5032 Chronic diastolic (congestive) heart failure: Secondary | ICD-10-CM | POA: Diagnosis not present

## 2020-10-19 DIAGNOSIS — R7303 Prediabetes: Secondary | ICD-10-CM

## 2020-10-19 DIAGNOSIS — I639 Cerebral infarction, unspecified: Secondary | ICD-10-CM

## 2020-10-19 DIAGNOSIS — I739 Peripheral vascular disease, unspecified: Secondary | ICD-10-CM | POA: Diagnosis not present

## 2020-10-19 DIAGNOSIS — Z8673 Personal history of transient ischemic attack (TIA), and cerebral infarction without residual deficits: Secondary | ICD-10-CM

## 2020-10-19 DIAGNOSIS — Z87891 Personal history of nicotine dependence: Secondary | ICD-10-CM

## 2020-10-19 NOTE — Assessment & Plan Note (Signed)
Continue with diet, exercise, and weight loss.

## 2020-10-19 NOTE — Assessment & Plan Note (Signed)
Continue diet and exercise.  She was commended on her weight loss.

## 2020-10-19 NOTE — Assessment & Plan Note (Signed)
Continues to abstain from smoking

## 2020-10-19 NOTE — Patient Instructions (Signed)
Medication Instructions:  Your physician recommends that you continue on your current medications as directed. Please refer to the Current Medication list given to you today.  *If you need a refill on your cardiac medications before your next appointment, please call your pharmacy*  Lab Work: NONE   Testing/Procedures: NONE  Follow-Up: At [facility], you and your health needs are our priority.  As part of our continuing mission to provide you with exceptional heart care, we have created designated Provider Care Teams.  These Care Teams include your primary Cardiologist (physician) and Advanced Practice Providers (APPs -  Physician Assistants and Nurse Practitioners) who all work together to provide you with the care you need, when you need it.  We recommend signing up for the patient portal called "MyChart".  Sign up information is provided on this After Visit Summary.  MyChart is used to connect with patients for Virtual Visits (Telemedicine).  Patients are able to view lab/test results, encounter notes, upcoming appointments, etc.  Non-urgent messages can be sent to your provider as well.   To learn more about what you can do with MyChart, go to [email or web address].    Your next appointment:   3 month(s)  The format for your next appointment:   In Person  Provider:   DR [place] OFFCIE

## 2020-10-19 NOTE — Assessment & Plan Note (Addendum)
Blood pressure is not at goal here today.  It has been much better controlled at home even before taking her morning medications.  She is doing a great job with diet and exercise and has lost 10 pounds.  Therefore will not make any changes.  Continue with current regimen including carvedilol, chlorthalidone, hydralazine, and spironolactone.  She was warned not to take naproxen for long as this could affect her blood pressure.  We also discussed importance of limiting sodium intake and not eating pickles regularly.  Renal Dopplers were - 06/2020.  She had no adenomas on CT, so hyperaldosteronism was not worked up.  No symptoms of pheochromocytoma.  Sleep study pending.

## 2020-10-19 NOTE — Assessment & Plan Note (Signed)
Diminished pulses peripherally.  She has no symptoms of claudication.

## 2020-10-19 NOTE — Assessment & Plan Note (Signed)
Idiopathic.  Loop recorder in place. LDL was 103.  Her goal is less than 70 given her prior stroke.  Will discuss at follow-up.

## 2020-10-19 NOTE — Assessment & Plan Note (Signed)
LDL goal should be less than 70 given history of stroke.  We will need to repeat and discuss at follow-up.

## 2020-10-19 NOTE — Assessment & Plan Note (Signed)
Doing well.  She is euvolemic.  Blood pressure management as below.

## 2020-10-19 NOTE — Progress Notes (Signed)
Cardiology Office Note   Date:  10/19/2020   ID:  Teresa [name], DOB Nov 15, 1960, MRN [id number]  PCP:  Teresa Feil, MD  Cardiologist:   Teresa Latch, MD   No chief complaint on file.    History of Present Illness: Teresa [name] is a 60 y.o. female with  stroke, hypertension, palpitations, moderate LVH, and hyperthyroidism who presents for follow up.  Teresa [name] saw Teresa Mews, MD, on 01/05/16.  At that appointment she reported occasional palpitations and burning in her chest.  She had an echo on 7/13 showed LVEF 55-60% with grade 2 diastolic dysfunction and mild MR.  Teresa [name] reports one year of palpitations and chest pain that occurs sporadically.  She was referred for Teresa [name] 02/24/16 that revealed LVEF 47% and no ischemia.  At her appointment on 01/2016 her blood pressure was elevated so she was switched from metoprolol to carvedilol.  She was subsequently diagnosed with cervicalgia.  Teresa [name] presented 08/2017 with cryptogenic embolic stroke.  Echo at that time revealed LVEF 60 to 65% with grade 1 diastolic dysfunction and moderate aortic regurgitation She had a TEE that was negative for left atrial appendage thrombus and her aortic regurgitation was noted to be mild.  A loop recorder was implanted and no significant arrhythmias have been recorded thus far.   Amlodipine was discontinued due to swelling.  She was started on chlorthalidone.  Hydralazine was switched from qid to tid.  She was referred to pharmacy to start [place].  She saw our pharmacists 01/2018 and switched Zetia to [place].   At her last visit, her blood pressure was poorly controlled, Carvedilol had recently been increased by her PCP. Hydrochlorothiazide was added at her last visit. She followed up with her pharmacist and HCTZ was switched to chlorthalidone. Spironolactoneractoi;ne was added. Her PCP noted she had trouble affording her medicine.  Today, she is doing well overall.  She is having a lot of pain  in her left shoulder, mostly when lying down.  There is also occasional weakness in her left hand and forearm, which is mildly relieved by flexing her fingers. She states this sensation is not necessarily numb and there is no tingly feeling. Her blood pressure at home has averaged 127/78 and is mostly stable.  Lately she is able to not consume as much meat in her diet. She has lost 10 pounds since her last visit. She is able to babysit her granddaughter daily, which helps her stay active. No chest pain or pressure, no orthopnea, PND, or shortness of breath.  She has no LE edema.  She attempted to enroll in the PR EP program through the [name] but was unable to due to [place].  Once the restrictions were lifted she is now not able to because she watches her 47-month-old granddaughter every day.  Last week she had episode of palpitations that lasted for several hours.  This was since her most recent ILR report.  Thus far there have been no significant arrhythmias on her loop recorder.  She had renal artery Dopplers 06/2020 that were negative for renal artery stenosis.   Past Medical History:  Diagnosis Date  . Allergy   . Anemia   . Cervical disc disorder with radiculopathy of cervical region 02/10/2016  . Chronic lower back pain   . Claudication in peripheral vascular disease ([place]) 06/12/2020  . Diverticulosis   . EKG, abnormal 01/06/2017  . Gastritis   . GERD (gastroesophageal reflux disease)   . H/O  hiatal hernia   . Hiatal hernia   . History of amputation of lesser toe of left foot ([place]) 03/11/2015  . History of colon cancer   . Hypertension   . Hyperthyroidism    hx  . Hypothyroidism   . Lateral knee pain, left 08/06/2013  . Left shoulder pain 05/28/2015  . Migraine headache    "a few times/yr" (03/05/2015)  . Neck pain 02/11/2014  . Neck strain 02/06/2012  . Numbness and tingling in right hand 05/16/2018  . Osteomyelitis ([place])    [name] 03/05/2015  . Osteomyelitis of toe of left foot ([place]) 03/05/2015   . Palpitations 01/05/2016  . Positive TB test   . Stroke ([place])   . Throat pain 09/21/2011   Normal CT and US neck march 2013, repeat Neck US in 1 year to follow-up small nodules.  Head CT 2013: normal ? glosspharyngeal neuralgia.  On amitriptyline await [facility] ENt referral.   . Tubular adenoma of colon   . Tubular adenoma of colon   . Ulnar neuropathy at elbow of right upper extremity 06/12/2018    Past Surgical History:  Procedure Laterality Date  . ABDOMINAL HYSTERECTOMY  2006  . AMPUTATION TOE Left 03/05/2015   Procedure: AMPUTATION LEFT 5TH  TOE;  Surgeon: Wylene Simmer, MD;  Location: [facility];  Service: Orthopedics;  Laterality: Left;  . APPENDECTOMY  2006  . BREAST BIOPSY Left 11/26/2014  . [place]  . COLONOSCOPY    . GALLBLADDER SURGERY    . LOOP RECORDER INSERTION N/A 09/18/2017   Procedure: LOOP RECORDER INSERTION;  Surgeon: Evans Lance, MD;  Location: [facility];  Service: Cardiovascular;  Laterality: N/A;  . OPEN REDUCTION INTERNAL FIXATION (ORIF) DISTAL RADIAL FRACTURE Right 09/14/2019   Procedure: OPEN REDUCTION INTERNAL FIXATION (ORIF)  RADIAL AND ULNA FRACTURE;  Surgeon: Iran Planas, MD;  Location: [facility];  Service: Orthopedics;  Laterality: Right;  . ORIF ULNAR FRACTURE Right 09/14/2019  . TEE WITHOUT CARDIOVERSION N/A 09/18/2017   Procedure: TRANSESOPHAGEAL ECHOCARDIOGRAM (TEE);  Surgeon: Pixie Casino, MD;  Location: [name];  Service: Cardiovascular;  Laterality: N/A;  . TOE AMPUTATION Left 03/05/2015   5th toe  . TUBAL LIGATION Bilateral 1990  . UPPER GASTROINTESTINAL ENDOSCOPY       Current Outpatient Medications  Medication Sig Dispense Refill  . aspirin 81 MG chewable tablet Chew 1 tablet (81 mg total) by mouth daily. 30 tablet 0  . B Complex Vitamins (VITAMIN B COMPLEX PO) Take 1 tablet by mouth daily.    . Carboxymethylcellulose Sodium (EYE DROPS OP) Place 1 drop into both eyes See admin instructions. Place 1-2 drops into  both eyes three to six times a day    . carvedilol (COREG) 25 MG tablet TAKE 1 TABLET (25 MG TOTAL) BY MOUTH 2 (TWO) TIMES DAILY WITH A MEAL. 180 tablet 0  . chlorthalidone (HYGROTON) 25 MG tablet Take 1 tablet (25 mg total) by mouth daily. 90 tablet 3  . hydrALAZINE (APRESOLINE) 100 MG tablet TAKE 1 TABLET BY MOUTH THREE TIMES DAILY 270 tablet 1  . levothyroxine (SYNTHROID) 175 MCG tablet Take 1 tablet (175 mcg total) by mouth every morning. 30 minutes before food 90 tablet 1  . naproxen (NAPROSYN) 500 MG tablet Take 1 tablet (500 mg total) by mouth 2 (two) times daily with a meal. 60 tablet 0  . Probiotic Product (PROBIOTIC DAILY PO) Take 1 tablet by mouth.    . Semaglutide,0.25 or 0.5MG /DOS, (  OZEMPIC, 0.25 OR 0.5 MG/DOSE,) 2 MG/1.5ML SOPN Inject 0.5 mg into the skin every 7 (seven) days. 1.5 mL 0  . spironolactone (ALDACTONE) 50 MG tablet TAKE 1 TABLET (50 MG TOTAL) BY MOUTH DAILY. 90 tablet 0  . tiZANidine (ZANAFLEX) 2 MG tablet Take 1 tablet (2 mg total) by mouth every 8 (eight) hours as needed for up to 60 doses for muscle spasms. 30 tablet 0  . VITAMIN D PO Take 1 tablet by mouth daily.      Current Facility-Administered Medications  Medication Dose Route Frequency Provider Last Rate Last Admin  . olopatadine (PATANOL) 0.1 % ophthalmic solution 1 drop  1 drop Both Eyes BID McDiarmid, Blane Ohara, MD        Allergies:   Amlodipine, Bactrim [sulfamethoxazole-trimethoprim], Clonidine derivatives, Lisinopril, Statins, Aspirin, and Latex    Social History:  The patient  reports that she quit smoking about 3 years ago. Her smoking use included cigarettes. She has a 18.00 pack-year smoking history. She has never used smokeless tobacco. She reports that she does not drink alcohol and does not use drugs.   Family History:  The patient's family history includes Breast cancer in her maternal aunt and paternal aunt; Cancer (age of onset: 36) in her father; Diabetes in her brother, mother, sister, and  another family member; Heart disease in her mother and another family member; Kidney disease in her brother and sister; Stroke in her father.    ROS:  Please see the history of present illness.   Otherwise, review of systems are positive for left shoulder pain, and weakness in her left hand and forearm.   All other systems are reviewed and negative.    PHYSICAL EXAM: VS:  BP (!) 156/72   Pulse 78   Ht 5\' 5"  (1.651 m)   Wt 196 lb 9.6 oz (89.2 kg)   BMI 32.72 kg/m  , BMI Body mass index is 32.72 kg/m. GENERAL:  Well appearing HEENT: Pupils equal round and reactive, fundi not visualized, oral mucosa unremarkable NECK:  No jugular venous distention, waveform within normal limits, carotid upstroke brisk and symmetric, no bruits LUNGS:  Clear to auscultation bilaterally HEART:  RRR.  PMI not displaced or sustained,S1 and S2 within normal limits, no S3, no S4, no clicks, no rubs, no murmurs ABD:  Flat, positive bowel sounds normal in frequency in pitch, no bruits, no rebound, no guarding, no midline pulsatile mass, no hepatomegaly, no splenomegaly EXT:  2+ DP, absent TP bilaterally.  2+ radial and carotid pulses.  No edema, no cyanosis no clubbing SKIN:  No rashes no nodules NEURO:  Cranial nerves II through XII grossly intact, motor grossly intact throughout PSYCH:  Cognitively intact, oriented to person place and time   EKG:   The ekg ordered 02/11/16 demonstrates sinus bradycardia.  Rate 59 bpm.  Inferolateral TWI, concerning for inferolateral ischemia.  Unchanged from 03/09/15. 06/12/2020: Sinus rhythm.  Rate 78 bpm.  LVH with repolarization abnormalities.  Left atrial enlargement. 10/19/2020: EKG is not ordered today.  Lexiscan Myoview 02/24/16:  Nuclear stress EF: 47% by computer calculations. Visually , the EF is normal and is around 55-60%.  There was no ST segment deviation noted during stress.  The study is normal.  This is a low risk study.  Echo 09/16/17: Study  Conclusions  - Left ventricle: The cavity size was normal. There was moderate   concentric hypertrophy. Systolic function was normal. The   estimated ejection fraction was in the range of  60% to 65%. [place]   motion was normal; there were no regional [place] motion   abnormalities. Doppler parameters are consistent with abnormal   left ventricular relaxation (grade 1 diastolic dysfunction).   Doppler parameters are consistent with elevated ventricular   end-diastolic filling pressure. - Aortic valve: There was moderate regurgitation. - Mitral valve: Calcified annulus. Mildly thickened leaflets .   There was mild regurgitation. - Left atrium: The atrium was mildly dilated. - Right ventricle: Systolic function was normal. - Tricuspid valve: There was mild regurgitation. - Pulmonic valve: There was trivial regurgitation. - Pulmonary arteries: The main pulmonary artery was normal-sized.   Systolic pressure was within the normal range. - Inferior vena cava: The vessel was normal in size. - Pericardium, extracardiac: There was no pericardial effusion.  Renal artery Doppler 06/2020: Renal:    Right: Normal size right kidney. Normal right Resisitive Index.     Normal cortical thickness of right kidney. No evidence of     right renal artery stenosis. RRV flow present. Cyst(s) noted.     See measurement above.  Left: Normal size of left kidney. Normal left Resistive Index.     Normal cortical thickness of the left kidney. No evidence of     left renal artery stenosis. LRV flow present.   Recent Labs: 04/14/2020: TSH 2.470 06/16/2020: Magnesium 2.0 08/13/2020: BUN 16; Creatinine, Ser 1.18; Potassium 4.0; Sodium 137    Lipid Panel    Component Value Date/Time   CHOL 163 08/20/2019 1129   TRIG 55 08/20/2019 1129   HDL 44 08/20/2019 1129   CHOLHDL 3.7 08/20/2019 1129   CHOLHDL 4.3 09/15/2017 1217   VLDL 13 09/15/2017 1217   LDLCALC 108 (H) 08/20/2019 1129      Wt  Readings from Last 3 Encounters:  10/19/20 196 lb 9.6 oz (89.2 kg)  09/25/20 201 lb 6 oz (91.3 kg)  08/13/20 207 lb (93.9 kg)      ASSESSMENT AND PLAN: Chronic diastolic CHF (congestive heart failure) ([place]) Doing well.  She is euvolemic.  Blood pressure management as below.  Resistant hypertension Blood pressure is not at goal here today.  It has been much better controlled at home even before taking her morning medications.  She is doing a great job with diet and exercise and has lost 10 pounds.  Therefore will not make any changes.  Continue with current regimen including carvedilol, chlorthalidone, hydralazine, and spironolactone.  She was warned not to take naproxen for long as this could affect her blood pressure.  We also discussed importance of limiting sodium intake and not eating pickles regularly.  Renal Dopplers were - 06/2020.  She had no adenomas on CT, so hyperaldosteronism was not worked up.  No symptoms of pheochromocytoma.  Sleep study pending.  Peripheral vascular disease (HCC) Diminished pulses peripherally.  She has no symptoms of claudication.  Morbid (severe) obesity with alveolar hypoventilation (HCC) Continue diet and exercise.  She was commended on her weight loss.  History of tobacco abuse Continues to abstain from smoking.  Prediabetes Continue with diet, exercise, and weight loss.  Hyperlipidemia LDL goal should be less than 70 given history of stroke.  We will need to repeat and discuss at follow-up.  History of CVA (cerebrovascular accident)  Idiopathic.  Loop recorder in place. LDL was 103.  Her goal is less than 70 given her prior stroke.  Will discuss at follow-up.      Current medicines are reviewed at length with the patient today.  The patient does not have concerns regarding medicines.  The following changes have been made:  Increase carvedilol and hydralazine.  Labs/ tests ordered today include:   No orders of the defined types were placed  in this encounter.    Disposition:   FU with Chanae Gemma C. Oval Linsey, MD, [name] in 3 months.    I,Mathew Stumpf,acting as a Education administrator for Teresa Latch, MD.,have documented all relevant documentation on the behalf of Teresa Latch, MD,as directed by  Teresa Latch, MD while in the presence of Teresa Latch, MD.   Signed, Merrill. Oval Linsey, MD, [name]  10/19/2020 4:06 PM    [facility]

## 2020-10-20 ENCOUNTER — Encounter: Payer: Self-pay | Admitting: Family Medicine

## 2020-10-20 LAB — CUP PACEART REMOTE DEVICE CHECK
Date Time Interrogation Session: 20220425010112
Implantable Pulse Generator Implant Date: 20190325

## 2020-10-25 DIAGNOSIS — Z419 Encounter for procedure for purposes other than remedying health state, unspecified: Secondary | ICD-10-CM | POA: Diagnosis not present

## 2020-11-05 ENCOUNTER — Ambulatory Visit: Payer: 59

## 2020-11-05 NOTE — Progress Notes (Signed)
[facility] Summary Report / Loop Recorder 

## 2020-11-12 NOTE — Addendum Note (Signed)
Addended by: Douglass Rivers D on: 11/12/2020 09:19 AM   Modules accepted: Level of Service

## 2020-11-23 LAB — CUP PACEART REMOTE DEVICE CHECK
Date Time Interrogation Session: 20220528011100
Implantable Pulse Generator Implant Date: 20190325

## 2020-11-24 ENCOUNTER — Ambulatory Visit
Admission: RE | Admit: 2020-11-24 | Discharge: 2020-11-24 | Disposition: A | Discharge status: Home / Self Care | Payer: 59 | Source: Ambulatory Visit | Attending: Family Medicine | Admitting: Family Medicine

## 2020-11-24 ENCOUNTER — Other Ambulatory Visit: Payer: Self-pay

## 2020-11-24 ENCOUNTER — Ambulatory Visit (INDEPENDENT_AMBULATORY_CARE_PROVIDER_SITE_OTHER): Payer: 59

## 2020-11-24 DIAGNOSIS — I639 Cerebral infarction, unspecified: Secondary | ICD-10-CM | POA: Diagnosis not present

## 2020-11-24 DIAGNOSIS — Z1231 Encounter for screening mammogram for malignant neoplasm of breast: Secondary | ICD-10-CM

## 2020-11-25 DIAGNOSIS — Z419 Encounter for procedure for purposes other than remedying health state, unspecified: Secondary | ICD-10-CM | POA: Diagnosis not present

## 2020-12-05 DIAGNOSIS — H5213 Myopia, bilateral: Secondary | ICD-10-CM | POA: Diagnosis not present

## 2020-12-16 NOTE — Progress Notes (Signed)
[facility] Summary Report / Loop Recorder 

## 2020-12-25 ENCOUNTER — Ambulatory Visit (INDEPENDENT_AMBULATORY_CARE_PROVIDER_SITE_OTHER): Payer: 59

## 2020-12-25 ENCOUNTER — Encounter: Payer: Self-pay | Admitting: Family Medicine

## 2020-12-25 ENCOUNTER — Other Ambulatory Visit: Payer: Self-pay | Admitting: Family Medicine

## 2020-12-25 DIAGNOSIS — Z419 Encounter for procedure for purposes other than remedying health state, unspecified: Secondary | ICD-10-CM | POA: Diagnosis not present

## 2020-12-25 DIAGNOSIS — I639 Cerebral infarction, unspecified: Secondary | ICD-10-CM | POA: Diagnosis not present

## 2020-12-28 LAB — CUP PACEART REMOTE DEVICE CHECK
Date Time Interrogation Session: 20220630011516
Implantable Pulse Generator Implant Date: 20190325

## 2021-01-01 ENCOUNTER — Other Ambulatory Visit: Payer: Self-pay | Admitting: Family Medicine

## 2021-01-01 MED ORDER — TIZANIDINE HCL 2 MG PO TABS: 2.0000 mg | ORAL_TABLET | Freq: Two times a day (BID) | ORAL | 1 refills | Status: DC | PRN

## 2021-01-01 MED ORDER — OZEMPIC (0.25 OR 0.5 MG/DOSE) 2 MG/1.5ML ~~LOC~~ SOPN: 0.5000 mg | PEN_INJECTOR | SUBCUTANEOUS | 2 refills | Status: DC

## 2021-01-06 ENCOUNTER — Telehealth: Payer: Self-pay

## 2021-01-06 NOTE — Telephone Encounter (Signed)
Ozempic was denied by patients insurance. You should receive more information in your box within the next 24 hours.

## 2021-01-11 ENCOUNTER — Encounter: Payer: Self-pay | Admitting: Family Medicine

## 2021-01-11 DIAGNOSIS — E669 Obesity, unspecified: Secondary | ICD-10-CM | POA: Insufficient documentation

## 2021-01-12 ENCOUNTER — Encounter: Payer: Self-pay | Admitting: Family Medicine

## 2021-01-12 ENCOUNTER — Ambulatory Visit (INDEPENDENT_AMBULATORY_CARE_PROVIDER_SITE_OTHER): Payer: 59 | Admitting: Family Medicine

## 2021-01-12 ENCOUNTER — Other Ambulatory Visit: Payer: Self-pay

## 2021-01-12 VITALS — BP 116/78 | HR 75 | Ht 65.0 in | Wt 205.6 lb

## 2021-01-12 DIAGNOSIS — I2510 Atherosclerotic heart disease of native coronary artery without angina pectoris: Secondary | ICD-10-CM | POA: Insufficient documentation

## 2021-01-12 DIAGNOSIS — Z122 Encounter for screening for malignant neoplasm of respiratory organs: Secondary | ICD-10-CM

## 2021-01-12 DIAGNOSIS — I10 Essential (primary) hypertension: Secondary | ICD-10-CM

## 2021-01-12 DIAGNOSIS — E785 Hyperlipidemia, unspecified: Secondary | ICD-10-CM | POA: Diagnosis not present

## 2021-01-12 DIAGNOSIS — Z87891 Personal history of nicotine dependence: Secondary | ICD-10-CM

## 2021-01-12 DIAGNOSIS — R7303 Prediabetes: Secondary | ICD-10-CM | POA: Diagnosis not present

## 2021-01-12 DIAGNOSIS — R079 Chest pain, unspecified: Secondary | ICD-10-CM | POA: Diagnosis not present

## 2021-01-12 LAB — POCT GLYCOSYLATED HEMOGLOBIN (HGB A1C): HbA1c, POC (controlled diabetic range): 6.3 % (ref 0.0–7.0)

## 2021-01-12 NOTE — Progress Notes (Signed)
     SUBJECTIVE:   CHIEF COMPLAINT / HPI:   Chest pain: C/O gassy left-sided lateral chest pain underneath her breast, which radiates to the back. Pain feels like gas. She denies trauma or injury to that side. Symptoms started about three weeks ago, and it waxes and wanes.   HTN/HLD/PreDM: She is compliant with her meds, Coreg 25 mg BID, hydralazine, 100 mg TID, Chlorthalidone 25 mg QD, and Aldactone 50 mg QD for her BP. She was on Semaglutide for weight loss and PreDM. However, her insurance stopped coverage of this medication about a month ago. She is not on Statins due to intolerance. She was referred to the lipid clinic in the past.  Tobacco use: She smoked 0.5-1 PPD for many years. She quit in 2019. Denies respiratory symptoms.   HM: She is due for Shingrix  PERTINENT  PMH / PSH: PMX reviewed  OBJECTIVE:   BP 116/78   Pulse 75   Ht 5\' 5"  (1.651 m)   Wt 205 lb 9.6 oz (93.3 kg)   SpO2 100%   BMI 34.21 kg/m   Physical Exam Vitals and nursing note reviewed.  Cardiovascular:     Rate and Rhythm: Normal rate and regular rhythm.     Heart sounds: Normal heart sounds. No murmur heard. Pulmonary:     Effort: Pulmonary effort is normal. No respiratory distress.     Breath sounds: Normal breath sounds. No wheezing.  Chest:  ++ tenderness over her left 6-7th ribs laterally with palpation  Abdominal:     General: Bowel sounds are normal. There is no distension.     Palpations: Abdomen is soft. There is no mass.     Tenderness: There is no abdominal tenderness.     ASSESSMENT/PLAN:  Atypical chest pain: Low concern for MI, likely MSK vs GI May trial gasX for gassy symptoms. Tylenol as needed for pain. EKG or troponin not indicated. Continue ASA for CAD. ED precautions discussed.  Hypertension BP looks great. Continue her current regimen  Hyperlipidemia Not on Statins due to intolerance. FLP checked today. I will contact her with result and readdress referral to  lipid clinic.   Prediabetes A1C checked today and it remains in the preDM range. Given previous episodes of hyperglycemia on labs, I will attempt reordering her Ozempic. Continue diet and exercise as well for DM prevention. She agreed with the plan.   History of tobacco abuse Lung cancer screen - CT ordered.    HM: Shingrix discussed. She declined.   [name], MD [place]

## 2021-01-12 NOTE — Assessment & Plan Note (Signed)
Not on Statins due to intolerance. FLP checked today. I will contact her with result and readdress referral to lipid clinic.

## 2021-01-12 NOTE — Assessment & Plan Note (Signed)
A1C checked today and it remains in the preDM range. Given previous episodes of hyperglycemia on labs, I will attempt reordering her Ozempic. Continue diet and exercise as well for DM prevention. She agreed with the plan.

## 2021-01-12 NOTE — Patient Instructions (Signed)
Chest Pilch Pain Chest Strohmeyer pain is pain in or around the bones and muscles of your chest. Chest Mccue pain may be caused by: An injury. Coughing a lot. Using your chest and arm muscles too much. Sometimes, the cause may not be known. This pain may take a few weeks or longerto get better. Follow these instructions at home: Managing pain, stiffness, and swelling If told, put ice on the painful area: Put ice in a plastic bag. Place a towel between your skin and the bag. Leave the ice on for 20 minutes, 2-3 times a day.  Activity Rest as told by your doctor. Avoid doing things that cause pain. This includes lifting heavy items. Ask your doctor what activities are safe for you. General instructions  Take over-the-counter and prescription medicines only as told by your doctor. Do not use any products that contain nicotine or tobacco, such as cigarettes, e-cigarettes, and chewing tobacco. If you need help quitting, ask your doctor. Keep all follow-up visits as told by your doctor. This is important.  Contact a doctor if: You have a fever. Your chest pain gets worse. You have new symptoms. Get help right away if: You feel sick to your stomach (nauseous) or you throw up (vomit). You feel sweaty or light-headed. You have a cough with mucus from your lungs (sputum) or you cough up blood. You are short of breath. These symptoms may be an emergency. Do not wait to see if the symptoms will go away. Get medical help right away. Call your local emergency services (911 in the [place] Do not drive yourself to the hospital. Summary Chest Lesser pain is pain in or around the bones and muscles of your chest. It may be treated with ice, rest, and medicines. Your condition may also get better if you avoid doing things that cause pain. Contact a doctor if you have a fever, chest pain that gets worse, or new symptoms. Get help right away if you feel light-headed or you get short of breath. These symptoms may  be an emergency. This information is not intended to replace advice given to you by your health care provider. Make sure you discuss any questions you have with your healthcare provider. Document Revised: 12/14/2017 Document Reviewed: 12/14/2017 Elsevier Patient Education  2022 [employer].

## 2021-01-12 NOTE — Assessment & Plan Note (Signed)
BP looks great. Continue her current regimen

## 2021-01-12 NOTE — Assessment & Plan Note (Signed)
Lung cancer screen - CT ordered.

## 2021-01-13 ENCOUNTER — Telehealth: Payer: Self-pay | Admitting: Family Medicine

## 2021-01-13 LAB — LIPID PANEL
Chol/HDL Ratio: 3.9 ratio (ref 0.0–4.4)
Cholesterol, Total: 141 mg/dL (ref 100–199)
HDL: 36 mg/dL — ABNORMAL LOW (ref >39)
LDL Chol Calc (NIH): 91 mg/dL (ref 0–99)
Triglycerides: 68 mg/dL (ref 0–149)
VLDL Cholesterol Cal: 14 mg/dL (ref 5–40)

## 2021-01-13 NOTE — Progress Notes (Signed)
[facility] Summary Report / Loop Recorder 

## 2021-01-13 NOTE — Telephone Encounter (Signed)
Result discussed. LDL improved, but still not at her goal of <70. Her current 10-year risk of ASCVD is 12%, with a lifetime risk of 50%.  I discussed moderate intensity Statin vs. Zetia. She is not willing to try either. Hx of Statin intolerance. She will continue lifestyle modification and recheck labs in 6-12 months.

## 2021-01-18 ENCOUNTER — Telehealth: Payer: Self-pay

## 2021-01-18 NOTE — Telephone Encounter (Signed)
VMT requesting call back reference next PREP class starting in August.

## 2021-01-25 ENCOUNTER — Other Ambulatory Visit: Payer: Self-pay

## 2021-01-25 ENCOUNTER — Telehealth: Payer: Self-pay

## 2021-01-25 ENCOUNTER — Ambulatory Visit (HOSPITAL_COMMUNITY)
Admission: RE | Admit: 2021-01-25 | Discharge: 2021-01-25 | Disposition: A | Discharge status: Home / Self Care | Payer: 59 | Source: Ambulatory Visit | Attending: Family Medicine | Admitting: Family Medicine

## 2021-01-25 DIAGNOSIS — Z419 Encounter for procedure for purposes other than remedying health state, unspecified: Secondary | ICD-10-CM | POA: Diagnosis not present

## 2021-01-25 DIAGNOSIS — Z122 Encounter for screening for malignant neoplasm of respiratory organs: Secondary | ICD-10-CM | POA: Diagnosis not present
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
## 2021-01-26 ENCOUNTER — Encounter: Payer: Self-pay | Admitting: Family Medicine

## 2021-01-26 ENCOUNTER — Ambulatory Visit (INDEPENDENT_AMBULATORY_CARE_PROVIDER_SITE_OTHER): Payer: 59 | Admitting: Family Medicine

## 2021-01-26 VITALS — BP 117/67 | HR 73 | Ht 65.0 in | Wt 204.8 lb

## 2021-01-26 DIAGNOSIS — S139XXA Sprain of joints and ligaments of unspecified parts of neck, initial encounter: Secondary | ICD-10-CM

## 2021-01-26 DIAGNOSIS — G8929 Other chronic pain: Secondary | ICD-10-CM | POA: Diagnosis not present

## 2021-01-26 DIAGNOSIS — R519 Headache, unspecified: Secondary | ICD-10-CM

## 2021-01-26 DIAGNOSIS — M25512 Pain in left shoulder: Secondary | ICD-10-CM

## 2021-01-26 LAB — CUP PACEART REMOTE DEVICE CHECK
Date Time Interrogation Session: 20220802011959
Implantable Pulse Generator Implant Date: 20190325

## 2021-01-26 MED ORDER — NAPROXEN 500 MG PO TABS: 500.0000 mg | ORAL_TABLET | Freq: Two times a day (BID) | ORAL | 0 refills | Status: DC | PRN
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
## 2021-01-26 NOTE — Patient Instructions (Signed)
Cervical Sprain A cervical sprain is also called a neck sprain. It is a stretch or tear in one or more ligaments in the neck. Ligaments are tissues that connect bones to eachother. Neck sprains can be mild, bad, or very bad. A very bad sprain in the neck can cause the bones in the neck to be unstable. This can damage the spinal cord. It can also cause serious problems in the brain, spinal cord, and nerves (nervous system). Most neck sprains heal in 4-6 weeks. It can take more or less time depending on: What caused the injury. The amount of injury. What are the causes? Neck sprains may be caused by trauma, such as: An injury from an accident in a vehicle such as a car or boat. A fall. The head and neck being moved front to back or side to side all of a sudden (whiplash injury). Mild neck sprains may be caused by wear and tear over time. What increases the risk? The following factors may make you more likely to develop this condition: Taking part in activities that put you at high risk of hurting your neck. These include: Contact sports. Animator. Gymnastics. Diving. Taking risks when driving or riding in a vehicle such as a car or boat. Arthritis caused by wear and tear of the joints in the spine. The neck not being very strong or flexible. Having had a neck injury in the past. Poor posture. Spending a lot of time in certain positions that put stress on the neck. This may be from sitting at a computer for a long time. What are the signs or symptoms? Symptoms of this condition include: Your neck, shoulders, or upper back feeling: Painful or sore. Stiff. Tender. Swollen. Hot, or like it is burning. Sudden tightening of neck muscles (spasms). Not being able to move the neck very much. Headache. Feeling dizzy. Feeling like you may vomit, or vomiting. Having a hand or arm that: Feels weak. Loses feeling (feels numb). Tingles. You may get symptoms right away after injury, or you  may get them over a few days. In some cases, symptoms may go away with treatment and come back overtime. How is this treated? This condition is treated by: Resting your neck. Icing the part of your neck that is hurt. Doing exercises to restore movement and strength to your neck (physical therapy). If there is no swelling, you may use heat therapy 2-3 days after the injury took place. If your injury is very bad, treatment may also include: Keeping your neck in place for a length of time. This may be done using: A neck collar. This supports your chin and the back of your head. A cervical traction device. This is a sling that holds up your head. The sling removes weight and pressure from your neck. It may also help to relieve pain. Medicines that help with: Pain. Irritation and swelling (inflammation). Medicines that help to relax your muscles (muscle relaxants). Surgery. This is rare. Follow these instructions at home: Medicines  Take over-the-counter and prescription medicines only as told by your doctor. Ask your doctor if the medicine prescribed to you: Requires you to avoid driving or using heavy machinery. Can cause trouble pooping (constipation). You may need to take these actions to prevent or treat trouble pooping: Drink enough fluid to keep your pee (urine) pale yellow. Take over-the-counter or prescription medicines. Eat foods that are high in fiber. These include beans, whole grains, and fresh fruits and vegetables. Limit foods that  are high in fat and sugar. These include fried or sweet foods.  If you have a neck collar: Wear it as told by your doctor. Do not take it off unless told. Ask your doctor before adjusting your collar. If you have long hair, keep it outside of the collar. Ask your doctor if you may take off the collar for cleaning and bathing. If you may take off the collar: Follow instructions about how to take it off safely. Clean it by hand with mild soap and  water. Let it air-dry fully. If your collar has pads that you can take out: Take the pads out every 1-2 days. Wash them by hand with soap and water. Let the pads air-dry fully before you put them back in the collar. Tell your doctor if your skin under the collar has irritation or sores. Managing pain, stiffness, and swelling     Use a cervical traction device, if told by your doctor. If told, put ice on the affected area. To do this: Put ice in a plastic bag. Place a towel between your skin and the bag. Leave the ice on for 20 minutes, 2-3 times a day. If told, put heat on the affected area. Do this before exercise or as often as told by your doctor. Use the heat source that your doctor recommends, such as a moist heat pack or a heating pad. Place a towel between your skin and the heat source. Leave the heat on for 20-30 minutes. Take the heat off if your skin turns bright red. This is very important if you cannot feel pain, heat, or cold. You may have a greater risk of getting burned. Activity Do not drive while wearing a neck collar. If you do not have a neck collar, ask if it is safe to drive while your neck heals. Do not lift anything that is heavier than 10 lb (4.5 kg), or the limit that you are told, until your doctor tells you that it is safe. Rest as told by your doctor. Do exercises as told by your doctor or physical therapist. Return to your normal activities as told by your doctor. Avoid positions and activities that make you feel worse. Ask your doctor what activities are safe for you. General instructions Do not use any products that contain nicotine or tobacco, such as cigarettes, e-cigarettes, and chewing tobacco. These can delay healing. If you need help quitting, ask your doctor. Keep all follow-up visits as told by your doctor or physical therapist. This is important. How is this prevented? To prevent a neck sprain from happening again: Practice good posture. Adjust  your workstation to help you do this. Exercise regularly as told by your doctor or physical therapist. Avoid activities that are risky or may cause a neck sprain. Contact a doctor if: Your symptoms get worse. Your symptoms do not get better after 2 weeks of treatment. Your pain gets worse. Medicine does not help your pain. You have new symptoms that you cannot explain. Your neck collar gives you sores on your skin or bothers your skin. Get help right away if: You have very bad pain. You get any of the following in any part of your body: Loss of feeling. Tingling. Weakness. You cannot move a part of your body. You have neck pain and either of these: Very bad dizziness. A very bad headache. Summary A cervical sprain is also called a neck sprain. It is a stretch or tear in one or more ligaments  in the neck. Ligaments are tissues that connect bones. Neck sprains may be caused by trauma, such as an injury or a fall. You may get symptoms right away after injury, or you may get them over a few days. Neck sprains may be treated with rest, heat, ice, medicines, exercise, and surgery. This information is not intended to replace advice given to you by your health care provider. Make sure you discuss any questions you have with your healthcare provider. Document Revised: 02/20/2019 Document Reviewed: 02/20/2019 Elsevier Patient Education  Teresa [name].

## 2021-01-27 ENCOUNTER — Ambulatory Visit (INDEPENDENT_AMBULATORY_CARE_PROVIDER_SITE_OTHER): Payer: 59

## 2021-01-27 DIAGNOSIS — I639 Cerebral infarction, unspecified: Secondary | ICD-10-CM

## 2021-02-15 ENCOUNTER — Encounter (HOSPITAL_BASED_OUTPATIENT_CLINIC_OR_DEPARTMENT_OTHER): Payer: Self-pay | Admitting: Cardiovascular Disease

## 2021-02-15 ENCOUNTER — Ambulatory Visit (INDEPENDENT_AMBULATORY_CARE_PROVIDER_SITE_OTHER): Payer: 59 | Admitting: Cardiovascular Disease

## 2021-02-15 ENCOUNTER — Other Ambulatory Visit: Payer: Self-pay

## 2021-02-15 VITALS — BP 122/78 | HR 71 | Ht 65.0 in | Wt 206.0 lb

## 2021-02-15 DIAGNOSIS — I2510 Atherosclerotic heart disease of native coronary artery without angina pectoris: Secondary | ICD-10-CM | POA: Diagnosis not present

## 2021-02-15 DIAGNOSIS — Z8673 Personal history of transient ischemic attack (TIA), and cerebral infarction without residual deficits: Secondary | ICD-10-CM | POA: Diagnosis not present

## 2021-02-15 DIAGNOSIS — I10 Essential (primary) hypertension: Secondary | ICD-10-CM

## 2021-02-15 DIAGNOSIS — R0789 Other chest pain: Secondary | ICD-10-CM

## 2021-02-15 DIAGNOSIS — R0681 Apnea, not elsewhere classified: Secondary | ICD-10-CM

## 2021-02-15 DIAGNOSIS — I1A0 Resistant hypertension: Secondary | ICD-10-CM

## 2021-02-15 DIAGNOSIS — R0683 Snoring: Secondary | ICD-10-CM | POA: Diagnosis not present

## 2021-02-15 HISTORY — DX: Snoring: R06.83

## 2021-02-15 NOTE — Assessment & Plan Note (Signed)
Blood pressure is much better controlled.  She has been taking her medications regularly and as prescribed.  Continue carvedilol, chlorthalidone, hydralazine, and spironolactone.

## 2021-02-15 NOTE — Assessment & Plan Note (Signed)
Lipids are poorly controlled.  She has not tolerated statins.  We discussed Repatha and next visit as potential options.  She is going to read about them and let us know if she is willing to try.  Continue aspirin.

## 2021-02-15 NOTE — Patient Instructions (Addendum)
Medication Instructions:  Your physician recommends that you continue on your current medications as directed. Please refer to the Current Medication list given to you today.   *If you need a refill on your cardiac medications before your next appointment, please call your pharmacy*  Lab Work: NONE   Testing/Procedures: Your physician has requested that you have en exercise stress myoview. For further information please visit [email or web address]. Please follow instruction sheet, as given. [place] [address]  NO CARVEDILOL NIGHT BEFORE OR MORNING OF STRESS TEST   Your physician has recommended that you have a sleep study. This test records several body functions during sleep, including: brain activity, eye movement, oxygen and carbon dioxide blood levels, heart rate and rhythm, breathing rate and rhythm, the flow of air through your mouth and nose, snoring, body muscle movements, and chest and belly movement. THE OFFICE WILL CALL YOU TO ARRANGE ONCE INSURANCE HAS BEEN REVIEWED  IF YOU DO NOT HEAR FROM THE OFFICE IN 2 WEEKS CALL TO FOLLOW UP   Follow-Up: At [facility], you and your health needs are our priority.  As part of our continuing mission to provide you with exceptional heart care, we have created designated Provider Care Teams.  These Care Teams include your primary Cardiologist (physician) and Advanced Practice Providers (APPs -  Physician Assistants and Nurse Practitioners) who all work together to provide you with the care you need, when you need it.  We recommend signing up for the patient portal called "MyChart".  Sign up information is provided on this After Visit Summary.  MyChart is used to connect with patients for Virtual Visits (Telemedicine).  Patients are able to view lab/test results, encounter notes, upcoming appointments, etc.  Non-urgent messages can be sent to your provider as well.   To learn more about what you can do with MyChart, go to  [email or web address].    Your next appointment:   6 month(s)  The format for your next appointment:   In Person  Provider:   Skeet Latch, MD or Laurann Montana, NP   Other Instructions  NEXILET AND REPATHA ARE THE TWO MEDICATIONS DISCUSSED FOR YOU TO CONSIDER TRYING

## 2021-02-15 NOTE — Progress Notes (Signed)
Cardiology Office Note   Date:  02/15/2021   ID:  Deshon Hovsepian, DOB [birth year], MRN [id number]  PCP:  Kinnie Feil, MD  Cardiologist:   Skeet Latch, MD   No chief complaint on file.    History of Present Illness: Teresa [name] is a 60 y.o. female with  stroke, hypertension, palpitations, moderate LVH, and hyperthyroidism who presents for follow up.  Teresa [name] saw Teresa Mews, MD, on 01/05/16.  At that appointment she reported occasional palpitations and burning in her chest.  She had an echo on 7/13 showed LVEF 55-60% with grade 2 diastolic dysfunction and mild MR.  Teresa [name] reports one year of palpitations and chest pain that occurs sporadically.  She was referred for [facility] 02/24/16 that revealed LVEF 47% and no ischemia.  At her appointment on 01/2016 her blood pressure was elevated so she was switched from metoprolol to carvedilol.  She was subsequently diagnosed with cervicalgia.  Teresa [name] presented 08/2017 with cryptogenic embolic stroke.  Echo at that time revealed LVEF 60 to 65% with grade 1 diastolic dysfunction and moderate aortic regurgitation She had a TEE that was negative for left atrial appendage thrombus and her aortic regurgitation was noted to be mild.  A loop recorder was implanted and no significant arrhythmias have been recorded thus far.   Amlodipine was discontinued due to swelling.  She was started on chlorthalidone.  Hydralazine was switched from qid to tid.  She was referred to pharmacy to start [place].  She saw our pharmacists 01/2018 and switched Zetia to [place].   Her blood pressure was poorly controlled, so Carvedilol was increased and HCTZ was added. She followed up with her pharmacist and HCTZ was switched to chlorthalidone. Spironolactone was added. Her PCP noted she had trouble affording her medicine.  She had episode of palpitations that lasted for several hours.  This was since her most recent ILR report.  Thus far there have been no  significant arrhythmias on her loop recorder.  She had renal artery Dopplers 06/2020 that were negative for renal artery stenosis. She was working on diet and exercise and lost 10 pounds. She was unable to participate in PREP because she had to care for her granddaughter.   Today, she is feeling good overall. She continues to care for her 48 month old granddaughter, which motivates her. Her main complaint today is occasionally feeling an ache in her chest and inferior to her left breast, and almost feels like a flutter. She notes that she feels like this may be gas at times. She denies feeling this pain during exertion or when lifting her granddaughter. At home, her blood pressure has been well-controlled, with readings such as 117/74. For exercise, she is walking a little bit and is limited by her schedule. For her diet, she has been ordering out more often due to staying with family. She is now back at home and will continue to work on her diet. Lately she is having some difficulty sleeping due to an uncomfortable bed. This is causing her some neck and shoulder pain. She endorses snoring, and generally feels well-rested. Of note, she states that recently her hot flashes have returned, and she experiences correlating shortness of breath. She denies any palpitations, lightheadedness, headaches, syncope, orthopnea, or PND. Also has no lower extremity edema or exertional symptoms.   Past Medical History:  Diagnosis Date   Allergy    Anemia    Cervical disc disorder with radiculopathy of cervical region  02/10/2016   Chronic lower back pain    Claudication in peripheral vascular disease ([place]) 06/12/2020   Diverticulosis    Ear discomfort 03/11/2016   EKG, abnormal 01/06/2017   Gastritis    GERD (gastroesophageal reflux disease)    H/O hiatal hernia    Hiatal hernia    History of amputation of lesser toe of left foot ([place]) 03/11/2015   History of colon cancer    Hypertension    Hyperthyroidism    hx    Hypothyroidism    Lateral knee pain, left 08/06/2013   Left shoulder pain 05/28/2015   Migraine headache    "a few times/yr" (03/05/2015)   Neck pain 02/11/2014   Neck strain 02/06/2012   Numbness and tingling in right hand 05/16/2018   Osteomyelitis ([place])    Teresa [name] 03/05/2015   Osteomyelitis of toe of left foot ([place]) 03/05/2015   Palpitations 01/05/2016   Positive TB test    Snoring 02/15/2021   Stroke [facility])    Throat pain 09/21/2011   Normal CT and US neck march 2013, repeat Neck US in 1 year to follow-up small nodules.  Head CT 2013: normal ? glosspharyngeal neuralgia.  On amitriptyline await [facility] ENt referral.    Tubular adenoma of colon    Tubular adenoma of colon    Ulnar neuropathy at elbow of right upper extremity 06/12/2018    Past Surgical History:  Procedure Laterality Date   ABDOMINAL HYSTERECTOMY  2006   AMPUTATION TOE Left 03/05/2015   Procedure: AMPUTATION LEFT 5TH  TOE;  Surgeon: Wylene Simmer, MD;  Location: [place];  Service: Orthopedics;  Laterality: Left;   APPENDECTOMY  2006   BREAST BIOPSY Left 11/26/2014   CESAREAN SECTION  1979 1986 1990   COLONOSCOPY     GALLBLADDER SURGERY     LOOP RECORDER INSERTION N/A 09/18/2017   Procedure: LOOP RECORDER INSERTION;  Surgeon: Evans Lance, MD;  Location: [place] [facility];  Service: Cardiovascular;  Laterality: N/A;   OPEN REDUCTION INTERNAL FIXATION (ORIF) DISTAL RADIAL FRACTURE Right 09/14/2019   Procedure: OPEN REDUCTION INTERNAL FIXATION (ORIF)  RADIAL AND ULNA FRACTURE;  Surgeon: Iran Planas, MD;  Location: [place];  Service: Orthopedics;  Laterality: Right;   ORIF ULNAR FRACTURE Right 09/14/2019   TEE WITHOUT CARDIOVERSION N/A 09/18/2017   Procedure: TRANSESOPHAGEAL ECHOCARDIOGRAM (TEE);  Surgeon: Pixie Casino, MD;  [facility] END[facility]COPY;  Service: Cardiovascular;  Laterality: N/A;   TOE AMPUTATION Left 03/05/2015   5th toe   TUBAL LIGATION Bilateral 1990   UPPER GASTROINTESTINAL ENDOSCOPY       Current Outpatient  Medications  Medication Sig Dispense Refill   aspirin 81 MG chewable tablet Chew 1 tablet (81 mg total) by mouth daily. 30 tablet 0   B Complex Vitamins (VITAMIN B COMPLEX PO) Take 1 tablet by mouth daily.     Carboxymethylcellulose Sodium (EYE DROPS OP) Place 1 drop into both eyes See admin instructions. Place 1-2 drops into both eyes three to six times a day     carvedilol (COREG) 25 MG tablet TAKE 1 TABLET BY MOUTH TWICE DAILY WITH MEALS 180 tablet 1   chlorthalidone (HYGROTON) 25 MG tablet Take 1 tablet (25 mg total) by mouth daily. 90 tablet 3   hydrALAZINE (APRESOLINE) 100 MG tablet TAKE 1 TABLET BY MOUTH THREE TIMES DAILY 270 tablet 1   levothyroxine (SYNTHROID) 175 MCG tablet Take 1 tablet (175 mcg total) by mouth every morning. 30 minutes before food 90 tablet 1  naproxen (NAPROSYN) 500 MG tablet Take 1 tablet (500 mg total) by mouth 2 (two) times daily as needed. 30 tablet 0   Probiotic Product (PROBIOTIC DAILY PO) Take 1 tablet by mouth.     RESTASIS 0.05 % ophthalmic emulsion Place 1 drop into both eyes 2 (two) times daily.     spironolactone (ALDACTONE) 50 MG tablet TAKE 1 TABLET (50 MG TOTAL) BY MOUTH DAILY. 90 tablet 0   tiZANidine (ZANAFLEX) 2 MG tablet Take 1 tablet (2 mg total) by mouth every 12 (twelve) hours as needed for muscle spasms. 30 tablet 1   VITAMIN D PO Take 1 tablet by mouth daily.      Semaglutide,0.25 or 0.'5MG'$ /DOS, (OZEMPIC, 0.25 OR 0.5 MG/DOSE,) 2 MG/1.5ML SOPN Inject 0.5 mg into the skin every 7 (seven) days. (Patient not taking: No sig reported) 1.5 mL 2   Current Facility-Administered Medications  Medication Dose Route Frequency Provider Last Rate Last Admin   olopatadine (PATANOL) 0.1 % ophthalmic solution 1 drop  1 drop Both Eyes BID McDiarmid, Blane Ohara, MD        Allergies:   Amlodipine, Bactrim [sulfamethoxazole-trimethoprim], Clonidine derivatives, Lisinopril, Statins, Aspirin, and Latex    Social History:  The patient  reports that she quit smoking  about 3 years ago. Her smoking use included cigarettes. She has a 36.00 pack-year smoking history. She has never used smokeless tobacco. She reports that she does not drink alcohol and does not use drugs.   Family History:  The patient's family history includes Breast cancer in her maternal aunt and paternal aunt; Cancer (age of onset: 53) in her father; Diabetes in her brother, mother, sister, and another family member; Heart disease in her mother and another family member; Kidney disease in her brother and sister; Stroke in her father.    ROS:   Please see the history of present illness. (+) Chest pain, central and inferior to left breast (+) Neck and shoulder pain (+) Hot flashes with secondary shortness of breath All other systems are reviewed and negative.     PHYSICAL EXAM: VS:  BP 122/78   Pulse 71   Ht '5\' 5"'$  (1.651 m)   Wt 206 lb (93.4 kg)   BMI 34.28 kg/m  , BMI Body mass index is 34.28 kg/m. GENERAL:  Well appearing HEENT: Pupils equal round and reactive, fundi not visualized, oral mucosa unremarkable NECK:  No jugular venous distention, waveform within normal limits, carotid upstroke brisk and symmetric, no bruits LUNGS:  Clear to auscultation bilaterally HEART:  RRR.  PMI not displaced or sustained,S1 and S2 within normal limits, no S3, no S4, no clicks, no rubs, no murmurs ABD:  Flat, positive bowel sounds normal in frequency in pitch, no bruits, no rebound, no guarding, no midline pulsatile mass, no hepatomegaly, no splenomegaly EXT:  2+ DP, absent TP bilaterally.  2+ radial and carotid pulses.  No edema, no cyanosis no clubbing SKIN:  No rashes no nodules NEURO:  Cranial nerves II through XII grossly intact, motor grossly intact throughout PSYCH:  Cognitively intact, oriented to person place and time   EKG:   02/15/2021: Sinus rhythm. Rate 71 bpm. Inferolateral T wave inversions. PVC. 10/19/2020: EKG is not ordered today. 06/12/2020: Sinus rhythm.  Rate 78 bpm.  LVH  with repolarization abnormalities.  Left atrial enlargement. 02/11/16: sinus bradycardia.  Rate 59 bpm.  Inferolateral TWI, concerning for inferolateral ischemia.  Unchanged from 03/09/15.  Renal artery Doppler 06/2020: Renal:     Right: Normal size  right kidney. Normal right Resisitive Index.         Normal cortical thickness of right kidney. No evidence of         right renal artery stenosis. RRV flow present. Cyst(s) noted.         See measurement above.  Left:  Normal size of left kidney. Normal left Resistive Index.         Normal cortical thickness of the left kidney. No evidence of         left renal artery stenosis. LRV flow present.   Echo 09/16/17: Study Conclusions   - Left ventricle: The cavity size was normal. There was moderate   concentric hypertrophy. Systolic function was normal. The   estimated ejection fraction was in the range of 60% to 65%. [place]   motion was normal; there were no regional Carmer motion   abnormalities. Doppler parameters are consistent with abnormal   left ventricular relaxation (grade 1 diastolic dysfunction).   Doppler parameters are consistent with elevated ventricular   end-diastolic filling pressure. - Aortic valve: There was moderate regurgitation. - Mitral valve: Calcified annulus. Mildly thickened leaflets .   There was mild regurgitation. - Left atrium: The atrium was mildly dilated. - Right ventricle: Systolic function was normal. - Tricuspid valve: There was mild regurgitation. - Pulmonic valve: There was trivial regurgitation. - Pulmonary arteries: The main pulmonary artery was normal-sized.   Systolic pressure was within the normal range. - Inferior vena cava: The vessel was normal in size. - Pericardium, extracardiac: There was no pericardial effusion.  Lexiscan Myoview 02/24/16: Nuclear stress EF: 47% by computer calculations. Visually , the EF is normal and is around 55-60%. There was no ST segment deviation noted during  stress. The study is normal. This is a low risk study.   Recent Labs: 04/14/2020: TSH 2.470 06/16/2020: Magnesium 2.0 08/13/2020: BUN 16; Creatinine, Ser 1.18; Potassium 4.0; Sodium 137    Lipid Panel    Component Value Date/Time   CHOL 141 01/12/2021 1105   TRIG 68 01/12/2021 1105   HDL 36 (L) 01/12/2021 1105   CHOLHDL 3.9 01/12/2021 1105   CHOLHDL 4.3 09/15/2017 1217   VLDL 13 09/15/2017 1217   LDLCALC 91 01/12/2021 1105      Wt Readings from Last 3 Encounters:  02/15/21 206 lb (93.4 kg)  01/26/21 204 lb 12.8 oz (92.9 kg)  01/12/21 205 lb 9.6 oz (93.3 kg)      ASSESSMENT AND PLAN: Resistant hypertension Blood pressure is much better controlled.  She has been taking her medications regularly and as prescribed.  Continue carvedilol, chlorthalidone, hydralazine, and spironolactone.  History of CVA (cerebrovascular accident) Lipids are poorly controlled.  She has not tolerated statins.  We discussed Repatha and next visit as potential options.  She is going to read about them and let us know if she is willing to try.  Continue aspirin.  CAD in native artery Extensive coronary calcification noted.  She has atypical chest pain and left shoulder pain.  She has no exertional chest pain.  However given her extensive coronary calcification, prior stroke, and risk factors, we will get an exercise Myoview to evaluate for ischemia.  LDL goal is less than 70.  She will consider Repatha in next visit.  Continue aspirin and carvedilol.  Snoring Patient has a history of snoring and apnea.  She was previously unable to complete her sleep study because her daughter was having her baby.  We will try and get it ordered  now.     Current medicines are reviewed at length with the patient today.  The patient does not have concerns regarding medicines.  The following changes have been made:  Increase carvedilol and hydralazine.  Labs/ tests ordered today include:   Orders Placed This  Encounter  Procedures   MYOCARDIAL PERFUSION IMAGING   EKG 12-Lead   Split night study      Disposition:   FU with Ayden Hardwick C. Oval Linsey, MD, [facility] in 6 months.    I,Mathew Stumpf,acting as a Education administrator for Skeet Latch, MD.,have documented all relevant documentation on the behalf of Skeet Latch, MD,as directed by  Skeet Latch, MD while in the presence of Skeet Latch, MD.  I, [place] Oval Linsey, MD have reviewed all documentation for this visit.  The documentation of the exam, diagnosis, procedures, and orders on 02/15/2021 are all accurate and complete.   Signed, Fronnie Urton C. Oval Linsey, MD, [facility]  02/15/2021 9:59 AM    [facility]

## 2021-02-15 NOTE — Assessment & Plan Note (Signed)
Extensive coronary calcification noted.  She has atypical chest pain and left shoulder pain.  She has no exertional chest pain.  However given her extensive coronary calcification, prior stroke, and risk factors, we will get an exercise Myoview to evaluate for ischemia.  LDL goal is less than 70.  She will consider Repatha in next visit.  Continue aspirin and carvedilol.

## 2021-02-15 NOTE — Assessment & Plan Note (Signed)
Patient has a history of snoring and apnea.  She was previously unable to complete her sleep study because her daughter was having her baby.  We will try and get it ordered now.

## 2021-02-16 ENCOUNTER — Telehealth (HOSPITAL_COMMUNITY): Payer: Self-pay | Admitting: *Deleted

## 2021-02-16 NOTE — Telephone Encounter (Signed)
Left message on voicemail per DPR in reference to upcoming appointment scheduled on 02/22/21 at 10:15 with detailed instructions given per Myocardial Perfusion Study Information Sheet for the test. LM to arrive 15 minutes early, and that it is imperative to arrive on time for appointment to keep from having the test rescheduled. If you need to cancel or reschedule your appointment, please call the office within 24 hours of your appointment. Failure to do so may result in a cancellation of your appointment, and a $50 no show fee. Phone number given for call back for any questions.

## 2021-02-18 ENCOUNTER — Telehealth: Payer: Self-pay | Admitting: *Deleted

## 2021-02-18 NOTE — Telephone Encounter (Signed)
Prior Authorization for split night sleep study sent to [facility] via web portal. Tracking Number [id number].   Prior Authorization for split night sleep study sent to [facility] Carleene Overlie) via web portal. Tracking Number [id number].  Case # [id number].

## 2021-02-22 ENCOUNTER — Other Ambulatory Visit: Payer: Self-pay

## 2021-02-22 ENCOUNTER — Ambulatory Visit (HOSPITAL_COMMUNITY): Payer: 59 | Attending: Cardiovascular Disease

## 2021-02-22 DIAGNOSIS — I2510 Atherosclerotic heart disease of native coronary artery without angina pectoris: Secondary | ICD-10-CM | POA: Diagnosis not present

## 2021-02-22 DIAGNOSIS — R0789 Other chest pain: Secondary | ICD-10-CM | POA: Insufficient documentation

## 2021-02-22 DIAGNOSIS — I10 Essential (primary) hypertension: Secondary | ICD-10-CM | POA: Insufficient documentation

## 2021-02-22 LAB — MYOCARDIAL PERFUSION IMAGING
Base ST Elevation (mm): 1 mm
LV dias vol: 81 mL (ref 46–106)
LV sys vol: 36 mL
Nuc Stress EF: 55 %
Peak HR: 101 {beats}/min
Rest HR: 64 {beats}/min
Rest Nuclear Isotope Dose: 10.8 mCi
SDS: 0
SRS: 0
SSS: 0
ST Elevation (mm): 1 mm
Stress Nuclear Isotope Dose: 32.5 mCi
TID: 0.94

## 2021-02-22 MED ORDER — TECHNETIUM TC 99M TETROFOSMIN IV KIT
32.5000 | PACK | Freq: Once | INTRAVENOUS | Status: AC | PRN
Start: 2021-02-22 — End: 2021-02-22
  Administered 2021-02-22: 32.5 via INTRAVENOUS
  Filled 2021-02-22: qty 33

## 2021-02-22 MED ORDER — TECHNETIUM TC 99M TETROFOSMIN IV KIT
10.8000 | PACK | Freq: Once | INTRAVENOUS | Status: AC | PRN
  Administered 2021-02-22: 10.8 via INTRAVENOUS
  Filled 2021-02-22: qty 11

## 2021-02-22 MED ORDER — REGADENOSON 0.4 MG/5ML IV SOLN
0.4000 mg | Freq: Once | INTRAVENOUS | Status: AC
  Administered 2021-02-22: 0.4 mg via INTRAVENOUS

## 2021-02-23 NOTE — Progress Notes (Signed)
[facility] Summary Report / Loop Recorder 

## 2021-02-25 DIAGNOSIS — Z419 Encounter for procedure for purposes other than remedying health state, unspecified: Secondary | ICD-10-CM | POA: Diagnosis not present

## 2021-02-25 NOTE — Telephone Encounter (Signed)
Denial received from [facility]. Awaiting [employer] decision.

## 2021-03-02 ENCOUNTER — Other Ambulatory Visit: Payer: Self-pay | Admitting: Family Medicine

## 2021-03-02 ENCOUNTER — Ambulatory Visit (INDEPENDENT_AMBULATORY_CARE_PROVIDER_SITE_OTHER): Payer: 59

## 2021-03-02 DIAGNOSIS — Z8673 Personal history of transient ischemic attack (TIA), and cerebral infarction without residual deficits: Secondary | ICD-10-CM | POA: Diagnosis not present

## 2021-03-02 LAB — CUP PACEART REMOTE DEVICE CHECK
Date Time Interrogation Session: 20220904012133
Implantable Pulse Generator Implant Date: 20190325

## 2021-03-02 MED ORDER — LEVOTHYROXINE SODIUM 175 MCG PO TABS: 175.0000 ug | ORAL_TABLET | Freq: Every day | ORAL | 1 refills | Status: DC

## 2021-03-10 ENCOUNTER — Other Ambulatory Visit: Payer: Self-pay | Admitting: Family Medicine

## 2021-03-10 NOTE — Progress Notes (Signed)
[facility] Summary Report / Loop Recorder 

## 2021-03-17 NOTE — Telephone Encounter (Signed)
Received approval from [facility] for split night sleep study. Auth # [id number]. Valid dates 02/27/21 to 05/28/21. Patient notified I will need to check with our billing manager to see if ok to schedule appointment since [facility] is primary insurance and they denied the request. I will schedule the appointment if ok to do so and call her back with the information. Patient agrees with this plan. Message routed to [place] for review.

## 2021-03-18 ENCOUNTER — Other Ambulatory Visit: Payer: Self-pay | Admitting: Cardiovascular Disease

## 2021-03-18 ENCOUNTER — Telehealth: Payer: Self-pay | Admitting: *Deleted

## 2021-03-18 DIAGNOSIS — R0683 Snoring: Secondary | ICD-10-CM

## 2021-03-18 DIAGNOSIS — R0681 Apnea, not elsewhere classified: Secondary | ICD-10-CM

## 2021-03-18 DIAGNOSIS — I10 Essential (primary) hypertension: Secondary | ICD-10-CM

## 2021-03-18 NOTE — Telephone Encounter (Signed)
Prior Authorization for HST sent to [facility] via web portal. Tracking Number [id number].

## 2021-03-18 NOTE — Telephone Encounter (Signed)
Received approval from [facility] for [place]. Auth # [id number]. Valid dates 04/27/21 to 06/16/21.

## 2021-03-22 ENCOUNTER — Telehealth: Payer: Self-pay | Admitting: *Deleted

## 2021-03-22 NOTE — Telephone Encounter (Signed)
Received approval from [facility] for HST. Auth # [id number]. Valid dates 04/17/21 to 05/25/21.

## 2021-03-22 NOTE — Telephone Encounter (Signed)
Patient notified of [place] appointment scheduled on 05/04/21.

## 2021-03-26 ENCOUNTER — Telehealth: Payer: Self-pay

## 2021-03-26 NOTE — Telephone Encounter (Signed)
Call to patient checking in to see if she wants to participate in next PREP class.  Now watching grandbaby M-F 7am to 5pm.  Reminded I do have an evening class at [facility] T/TH 615p-730 starting end of November Sts BP has been very well controlled.  Will check back in with her in Nov to check on interest

## 2021-03-27 DIAGNOSIS — Z419 Encounter for procedure for purposes other than remedying health state, unspecified: Secondary | ICD-10-CM | POA: Diagnosis not present

## 2021-04-05 ENCOUNTER — Ambulatory Visit (INDEPENDENT_AMBULATORY_CARE_PROVIDER_SITE_OTHER): Payer: 59

## 2021-04-05 DIAGNOSIS — I639 Cerebral infarction, unspecified: Secondary | ICD-10-CM

## 2021-04-08 LAB — CUP PACEART REMOTE DEVICE CHECK
Date Time Interrogation Session: 20221007022227
Implantable Pulse Generator Implant Date: 20190325

## 2021-04-13 NOTE — Progress Notes (Signed)
[facility] Summary Report / Loop Recorder 

## 2021-04-19 ENCOUNTER — Encounter (HOSPITAL_BASED_OUTPATIENT_CLINIC_OR_DEPARTMENT_OTHER): Payer: 59 | Admitting: Cardiovascular Disease

## 2021-04-26 ENCOUNTER — Encounter (HOSPITAL_COMMUNITY): Payer: Self-pay

## 2021-04-26 ENCOUNTER — Encounter: Payer: Self-pay | Admitting: Family Medicine

## 2021-04-26 ENCOUNTER — Ambulatory Visit (HOSPITAL_COMMUNITY)
Admission: EM | Admit: 2021-04-26 | Discharge: 2021-04-26 | Disposition: A | Discharge status: Home / Self Care | Payer: 59 | Attending: Student | Admitting: Student

## 2021-04-26 ENCOUNTER — Other Ambulatory Visit: Payer: Self-pay

## 2021-04-26 DIAGNOSIS — R1012 Left upper quadrant pain: Secondary | ICD-10-CM

## 2021-04-26 DIAGNOSIS — M545 Low back pain, unspecified: Secondary | ICD-10-CM

## 2021-04-26 DIAGNOSIS — K5901 Slow transit constipation: Secondary | ICD-10-CM

## 2021-04-26 LAB — POCT URINALYSIS DIPSTICK, ED / UC
Bilirubin Urine: NEGATIVE
Glucose, UA: NEGATIVE mg/dL
Hgb urine dipstick: NEGATIVE
Ketones, ur: NEGATIVE mg/dL
Leukocytes,Ua: NEGATIVE
Nitrite: NEGATIVE
Protein, ur: NEGATIVE mg/dL
Specific Gravity, Urine: 1.015 (ref 1.005–1.030)
Urobilinogen, UA: 1 mg/dL (ref 0.0–1.0)
pH: 7 (ref 5.0–8.0)

## 2021-04-26 NOTE — Discharge Instructions (Addendum)
-  Continue Ducolax at home, and make sure to drink plenty of water and eat leafy green vegetables. -If you develop worsening of the symptoms including abdominal pain, chest pain, shoulder pain-head to the emergency department. -Call your primary care provider tomorrow to schedule a follow-up with them.

## 2021-04-26 NOTE — ED Notes (Signed)
Pt states PCP office contacted her while in the room and will be doing EKG on her.

## 2021-04-26 NOTE — Telephone Encounter (Signed)
Spoke with patient in regards to symptoms. Patient reports symptoms have been present for ~3 days. Patient reports the pain is "really" bad. Patient reports it wraps around her back and shoulders. Patient denies any nausea or vomiting. Patient reports she has not had a "regular" BM despite taking laxatives. Due to the amount of pain the patent is in and not apts until Thursday, patient advised to go to UC. Patient agreed with plan.

## 2021-04-26 NOTE — ED Provider Notes (Signed)
[place]    CSN: [id number] Arrival date & time: 04/26/21  1536      History   Chief Complaint Chief Complaint  Patient presents with   Flank Pain   Rib Pain     HPI Teresa [name] is a 60 y.o. female presenting with flank and rib pain for few days.  Medical history chronic back pain, peripheral vascular disease, hiatal hernia.  Describes 3 days of constipation though she is still passing gas, pain of the left upper quadrant radiating to her left flank, bloating, feeling gassy.  Tolerating fluids and food, denies nausea or vomiting.  Last bowel movement was 3 days ago though she is still passing gas, she attempted Colace at home without improvement.  Denies left-sided chest pain, left jaw pain, left shoulder pain, shortness of breath, dizziness, weakness. Family history pancreatic cancer, personal history colon cancer. Cannot recall last colonoscopy.  HPI  Past Medical History:  Diagnosis Date   Allergy    Anemia    Cervical disc disorder with radiculopathy of cervical region 02/10/2016   Chronic lower back pain    Claudication in peripheral vascular disease ([place]) 06/12/2020   Diverticulosis    Ear discomfort 03/11/2016   EKG, abnormal 01/06/2017   Gastritis    GERD (gastroesophageal reflux disease)    H/O hiatal hernia    Hiatal hernia    History of amputation of lesser toe of left foot ([place]) 03/11/2015   History of colon cancer    Hypertension    Hyperthyroidism    hx   Hypothyroidism    Lateral knee pain, left 08/06/2013   Left shoulder pain 05/28/2015   Migraine headache    "a few times/yr" (03/05/2015)   Neck pain 02/11/2014   Neck strain 02/06/2012   Numbness and tingling in right hand 05/16/2018   Osteomyelitis ([place])    [name] 03/05/2015   Osteomyelitis of toe of left foot ([place]) 03/05/2015   Palpitations 01/05/2016   Positive TB test    Snoring 02/15/2021   Stroke ([place])    Throat pain 09/21/2011   Normal CT and US neck march 2013, repeat Neck US in 1 year  to follow-up small nodules.  Head CT 2013: normal ? glosspharyngeal neuralgia.  On amitriptyline await [facility] ENt referral.    Tubular adenoma of colon    Tubular adenoma of colon    Ulnar neuropathy at elbow of right upper extremity 06/12/2018    Patient Active Problem List   Diagnosis Date Noted   Snoring 02/15/2021   CAD in native artery 01/12/2021   Obese 01/11/2021   Peripheral vascular disease ([place]) 06/12/2020   History of forearm fracture 05/01/2020   History of hiatal hernia 05/01/2020   CKD (chronic kidney disease), stage II 01/15/2020   Tubular adenoma of colon 09/14/2019   Chronic diastolic CHF (congestive heart failure) ([place]) 09/14/2019   COVID-19 07/02/2019   Ulnar neuropathy at elbow of right upper extremity 06/12/2018   Migraine 01/12/2018   Hyperlipidemia    History of CVA (cerebrovascular accident) 09/15/2017   History of cholecystectomy 06/27/2017   Small intestinal bacterial overgrowth 03/23/2017   Prediabetes 10/29/2015   Tendinopathy of left rotator cuff 10/27/2015   Resistant hypertension    Thyroid nodule 02/12/2013   Headache 07/17/2012   GERD (gastroesophageal reflux disease) 02/06/2012   History of tobacco abuse 09/21/2011   Hypertension 09/21/2011   Hypothyroidism 09/21/2011    Past Surgical History:  Procedure Laterality Date   ABDOMINAL HYSTERECTOMY  2006  AMPUTATION TOE Left 03/05/2015   Procedure: AMPUTATION LEFT 5TH  TOE;  Surgeon: Wylene Simmer, MD;  Location: [facility];  Service: Orthopedics;  Laterality: Left;   APPENDECTOMY  2006   BREAST BIOPSY Left 11/26/2014   CESAREAN SECTION  1979 1986 1990   COLONOSCOPY     GALLBLADDER SURGERY     LOOP RECORDER INSERTION N/A 09/18/2017   Procedure: LOOP RECORDER INSERTION;  Surgeon: Evans Lance, MD;  Location: [facility];  Service: Cardiovascular;  Laterality: N/A;   OPEN REDUCTION INTERNAL FIXATION (ORIF) DISTAL RADIAL FRACTURE Right 09/14/2019   Procedure: OPEN REDUCTION INTERNAL FIXATION  (ORIF)  RADIAL AND ULNA FRACTURE;  Surgeon: Iran Planas, MD;  Location: [facility];  Service: Orthopedics;  Laterality: Right;   ORIF ULNAR FRACTURE Right 09/14/2019   TEE WITHOUT CARDIOVERSION N/A 09/18/2017   Procedure: TRANSESOPHAGEAL ECHOCARDIOGRAM (TEE);  Surgeon: Pixie Casino, MD;  Location: [facility];  Service: Cardiovascular;  Laterality: N/A;   TOE AMPUTATION Left 03/05/2015   5th toe   TUBAL LIGATION Bilateral 1990   UPPER GASTROINTESTINAL ENDOSCOPY      OB History   No obstetric history on file.      Home Medications    Prior to Admission medications   Medication Sig Start Date End Date Taking? Authorizing Provider  aspirin 81 MG chewable tablet Chew 1 tablet (81 mg total) by mouth daily. 09/19/17   Sherene Sires, DO  B Complex Vitamins (VITAMIN B COMPLEX PO) Take 1 tablet by mouth daily.    [provider]  Carboxymethylcellulose Sodium (EYE DROPS OP) Place 1 drop into both eyes See admin instructions. Place 1-2 drops into both eyes three to six times a day    [provider]  carvedilol (COREG) 25 MG tablet TAKE 1 TABLET BY MOUTH TWICE DAILY WITH MEALS 12/25/20   Kinnie Feil, MD  chlorthalidone (HYGROTON) 25 MG tablet Take 1 tablet (25 mg total) by mouth daily. 07/17/20 02/15/21  Skeet Latch, MD  hydrALAZINE (APRESOLINE) 100 MG tablet TAKE 1 TABLET BY MOUTH THREE TIMES DAILY 03/11/21   Kinnie Feil, MD  levothyroxine (SYNTHROID) 175 MCG tablet Take 1 tablet (175 mcg total) by mouth daily before breakfast. 03/02/21   Kinnie Feil, MD  naproxen (NAPROSYN) 500 MG tablet Take 1 tablet (500 mg total) by mouth 2 (two) times daily as needed. 01/26/21   Kinnie Feil, MD  Probiotic Product (PROBIOTIC DAILY PO) Take 1 tablet by mouth.    [provider]  RESTASIS 0.05 % ophthalmic emulsion Place 1 drop into both eyes 2 (two) times daily. 01/12/21   [provider]  Semaglutide,0.25 or 0.5MG /DOS, (OZEMPIC, 0.25 OR 0.5 MG/DOSE,) 2  MG/1.5ML SOPN Inject 0.5 mg into the skin every 7 (seven) days. Patient not taking: No sig reported 01/01/21   Kinnie Feil, MD  spironolactone (ALDACTONE) 50 MG tablet TAKE 1 TABLET (50 MG TOTAL) BY MOUTH DAILY. 09/25/20 09/25/21  Kinnie Feil, MD  tiZANidine (ZANAFLEX) 2 MG tablet Take 1 tablet (2 mg total) by mouth every 12 (twelve) hours as needed for muscle spasms. 01/01/21   Kinnie Feil, MD  VITAMIN D PO Take 1 tablet by mouth daily.     [provider]    Family History Family History  Problem Relation Age of Onset   Heart disease Mother        CAB age 77   Diabetes Mother    Cancer Father 76  pancreatic cancer   Stroke Father    Diabetes Sister    Kidney disease Sister        renal failure   Diabetes Brother    Kidney disease Brother        renal failure   Heart disease Other    Diabetes Other    Breast cancer Maternal Aunt        per pt aunts and uncles died of cancer not sure the type   Breast cancer Paternal Aunt    Esophageal cancer Neg Hx    Rectal cancer Neg Hx    Stomach cancer Neg Hx    Thyroid disease Neg Hx     Social History Social History   Tobacco Use   Smoking status: Former    Packs/day: 1.00    Years: 36.00    Pack years: 36.00    Types: Cigarettes    Quit date: 09/23/2017    Years since quitting: 3.5   Smokeless tobacco: Never   Tobacco comments:    Tobacco info given 03/01/17  Vaping Use   Vaping Use: Former  Substance Use Topics   Alcohol use: No   Drug use: No     Allergies   Amlodipine, Bactrim [sulfamethoxazole-trimethoprim], Clonidine derivatives, Lisinopril, Statins, Aspirin, and Latex   Review of Systems Review of Systems  Constitutional:  Negative for appetite change, chills, diaphoresis, fever and unexpected weight change.  HENT:  Negative for congestion, ear pain, sinus pressure, sinus pain, sneezing, sore throat and trouble swallowing.   Respiratory:  Negative for cough, chest tightness and  shortness of breath.   Cardiovascular:  Negative for chest pain.  Gastrointestinal:  Positive for abdominal pain and constipation. Negative for abdominal distention, anal bleeding, blood in stool, diarrhea, nausea, rectal pain and vomiting.  Genitourinary:  Positive for flank pain. Negative for dysuria, frequency and urgency.  Musculoskeletal:  Negative for back pain and myalgias.  Neurological:  Negative for dizziness, light-headedness and headaches.  All other systems reviewed and are negative.   Physical Exam Triage Vital Signs ED Triage Vitals  Enc Vitals Group     BP 04/26/21 1634 (!) 157/97     Pulse Rate 04/26/21 1634 74     Resp 04/26/21 1634 18     Temp 04/26/21 1634 98.8 F (37.1 C)     Temp Source 04/26/21 1634 Oral     SpO2 04/26/21 1634 95 %     Weight --      Height --      Head Circumference --      Peak Flow --      Pain Score 04/26/21 1633 7     Pain Loc --      Pain Edu? --      Excl. in [place]? --    No data found.  Updated Vital Signs BP (!) 157/97 (BP Location: Left Arm)   Pulse 74   Temp 98.8 F (37.1 C) (Oral)   Resp 18   SpO2 95%   Visual Acuity Right Eye Distance:   Left Eye Distance:   Bilateral Distance:    Right Eye Near:   Left Eye Near:    Bilateral Near:     Physical Exam Vitals reviewed.  Constitutional:      General: She is not in acute distress.    Appearance: Normal appearance. She is not ill-appearing or diaphoretic.  HENT:     Head: Normocephalic and atraumatic.     Mouth/Throat:  Mouth: Mucous membranes are moist.     Comments: Moist mucous membranes Eyes:     Extraocular Movements: Extraocular movements intact.     Pupils: Pupils are equal, round, and reactive to light.  Cardiovascular:     Rate and Rhythm: Normal rate and regular rhythm.     Pulses:          Radial pulses are 2+ on the right side and 2+ on the left side.     Heart sounds: Normal heart sounds.  Pulmonary:     Effort: Pulmonary effort is normal.      Breath sounds: Normal breath sounds. No wheezing, rhonchi or rales.  Abdominal:     General: Bowel sounds are normal. There is distension.     Palpations: Abdomen is soft. There is no mass.     Tenderness: There is no abdominal tenderness. There is no right CVA tenderness, left CVA tenderness, guarding or rebound.     Comments: Abd distension. BS positive throughout, without mass guarding or rebound. LUQ mildly tender to palpation. No CVAT.   Musculoskeletal:     Right lower leg: No edema.     Left lower leg: No edema.  Skin:    General: Skin is warm.     Capillary Refill: Capillary refill takes less than 2 seconds.     Comments: Good skin turgor  Neurological:     General: No focal deficit present.     Mental Status: She is alert and oriented to person, place, and time.  Psychiatric:        Mood and Affect: Mood normal.        Behavior: Behavior normal.        Thought Content: Thought content normal.        Judgment: Judgment normal.     UC Treatments / Results  Labs (all labs ordered are listed, but only abnormal results are displayed) Labs Reviewed  POCT URINALYSIS DIPSTICK, ED / UC    EKG   Radiology No results found.  Procedures Procedures (including critical care time)  Medications Ordered in UC Medications - No data to display  Initial Impression / Assessment and Plan / UC Course  I have reviewed the triage vital signs and the nursing notes.  Pertinent labs & imaging results that were available during my care of the patient were reviewed by me and considered in my medical decision making (see chart for details).     This patient is a very pleasant 60 y.o. year old female presenting with abd pain and bloating, with radiation to flank. Afebrile, nontachycardic, minimal abd pain and no reproducible CVAT on exam  UA wnl, did not send culture.   Initially planned to check an EKG, but patient ultimately declined this. She understands I cannot rule out  cardiac pathology.   F/u with PCP next business day.   Strict ED return precautions discussed. Patient verbalizes understanding and agreement.    Final Clinical Impressions(s) / UC Diagnoses   Final diagnoses:  Left upper quadrant abdominal pain  Acute left-sided low back pain without sciatica  Slow transit constipation     Discharge Instructions      -Continue Ducolax at home, and make sure to drink plenty of water and eat leafy green vegetables. -If you develop worsening of the symptoms including abdominal pain, chest pain, shoulder pain-head to the emergency department. -Call your primary care provider tomorrow to schedule a follow-up with them.   ED Prescriptions   None  PDMP not reviewed this encounter.   Hazel Sams, PA-C 04/26/21 1755

## 2021-04-26 NOTE — ED Triage Notes (Signed)
Pt presents with left side rib pain that radiates around to left flank X 4 days with no known injury.

## 2021-04-27 DIAGNOSIS — Z419 Encounter for procedure for purposes other than remedying health state, unspecified: Secondary | ICD-10-CM | POA: Diagnosis not present

## 2021-05-03 ENCOUNTER — Other Ambulatory Visit: Payer: Self-pay

## 2021-05-03 ENCOUNTER — Ambulatory Visit (INDEPENDENT_AMBULATORY_CARE_PROVIDER_SITE_OTHER): Payer: 59 | Admitting: Family Medicine

## 2021-05-03 VITALS — BP 132/85 | HR 69 | Ht 65.0 in | Wt 207.4 lb

## 2021-05-03 DIAGNOSIS — M546 Pain in thoracic spine: Secondary | ICD-10-CM

## 2021-05-03 DIAGNOSIS — Z23 Encounter for immunization: Secondary | ICD-10-CM

## 2021-05-03 MED ORDER — LIDOCAINE 5 % EX PTCH: 1.0000 | MEDICATED_PATCH | CUTANEOUS | 0 refills | Status: DC

## 2021-05-03 NOTE — Patient Instructions (Signed)
Thank you for coming in today. We discuss you may have a back spasm. You can try lidocaine patches, heat, ice, as well as tennis ball massages. Follow up if not improving in 1-2 weeks.
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
## 2021-05-04 ENCOUNTER — Ambulatory Visit (HOSPITAL_BASED_OUTPATIENT_CLINIC_OR_DEPARTMENT_OTHER): Payer: 59 | Admitting: Cardiovascular Disease

## 2021-05-04 ENCOUNTER — Telehealth: Payer: Self-pay

## 2021-05-04 DIAGNOSIS — I10 Essential (primary) hypertension: Secondary | ICD-10-CM

## 2021-05-04 DIAGNOSIS — R0681 Apnea, not elsewhere classified: Secondary | ICD-10-CM

## 2021-05-04 DIAGNOSIS — R0683 Snoring: Secondary | ICD-10-CM

## 2021-05-04 NOTE — Telephone Encounter (Signed)
PA submitted via covermymeds. Response may take up to 72 hours.   Talbot Grumbling, RN

## 2021-05-04 NOTE — Telephone Encounter (Signed)
Received PA request for Lidocaine 5% patches. Insurance is requiring OV note for documentation of need for patches.   Please route back to me once OV note has been completed.   Key: [id number]  Thanks.   Talbot Grumbling, RN

## 2021-05-06 LAB — CUP PACEART REMOTE DEVICE CHECK
Date Time Interrogation Session: 20221109012155
Implantable Pulse Generator Implant Date: 20190325

## 2021-05-10 ENCOUNTER — Ambulatory Visit (INDEPENDENT_AMBULATORY_CARE_PROVIDER_SITE_OTHER): Payer: 59

## 2021-05-10 DIAGNOSIS — I639 Cerebral infarction, unspecified: Secondary | ICD-10-CM

## 2021-05-11 ENCOUNTER — Encounter: Payer: Self-pay | Admitting: Family Medicine

## 2021-05-11 ENCOUNTER — Ambulatory Visit (INDEPENDENT_AMBULATORY_CARE_PROVIDER_SITE_OTHER): Payer: 59 | Admitting: Family Medicine

## 2021-05-11 ENCOUNTER — Other Ambulatory Visit: Payer: Self-pay

## 2021-05-11 VITALS — BP 143/77 | HR 69 | Ht 65.0 in | Wt 208.4 lb

## 2021-05-11 DIAGNOSIS — I10 Essential (primary) hypertension: Secondary | ICD-10-CM

## 2021-05-11 DIAGNOSIS — E8889 Other specified metabolic disorders: Secondary | ICD-10-CM

## 2021-05-11 DIAGNOSIS — R5383 Other fatigue: Secondary | ICD-10-CM | POA: Insufficient documentation

## 2021-05-11 DIAGNOSIS — S98132A Complete traumatic amputation of one left lesser toe, initial encounter: Secondary | ICD-10-CM

## 2021-05-11 DIAGNOSIS — R202 Paresthesia of skin: Secondary | ICD-10-CM | POA: Diagnosis not present

## 2021-05-11 DIAGNOSIS — N182 Chronic kidney disease, stage 2 (mild): Secondary | ICD-10-CM

## 2021-05-11 DIAGNOSIS — Z23 Encounter for immunization: Secondary | ICD-10-CM | POA: Diagnosis not present

## 2021-05-11 DIAGNOSIS — Z89422 Acquired absence of other left toe(s): Secondary | ICD-10-CM

## 2021-05-11 DIAGNOSIS — E039 Hypothyroidism, unspecified: Secondary | ICD-10-CM

## 2021-05-11 DIAGNOSIS — M5412 Radiculopathy, cervical region: Secondary | ICD-10-CM

## 2021-05-11 DIAGNOSIS — I739 Peripheral vascular disease, unspecified: Secondary | ICD-10-CM

## 2021-05-11 DIAGNOSIS — I5032 Chronic diastolic (congestive) heart failure: Secondary | ICD-10-CM

## 2021-05-11 NOTE — Assessment & Plan Note (Addendum)
Presenting as intermittent left arm numbness depending on positioning. No neurologic deficit on exam. I reviewed and discussed her neck xray done 7 months ago with her (Multilevel degenerative joint disease resulting in mild bilateral neuroforaminal narrowing at C3-4). PT and spine specialist referral discussed. She prefers to hold off while doing home exercise. Continue NSAID and muscle relaxant prn. Consider re-imaging. TSH, Vitamin B12 and CBC checked today. Return precaution discussed. Consider Gabapentin in the future.

## 2021-05-11 NOTE — Patient Instructions (Signed)
Cervical Radiculopathy ?Cervical radiculopathy means that a nerve in the neck (a cervical nerve) is pinched or bruised. This can happen because of an injury to the cervical spine (vertebrae) in the neck, or as a normal part of getting older. This condition can cause pain or loss of feeling (numbness) that runs from your neck all the way down to your arm and fingers. Often, this condition gets better with rest. Treatment may be needed if the condition does not get better. ?What are the causes? ?A neck injury. ?A bulging disk in your spine. ?Sudden muscle tightening (muscle spasms). ?Tight muscles in your neck due to overuse. ?Arthritis. ?Breakdown in the bones and joints of the spine (spondylosis) due to getting older. ?Bone spurs that form near the nerves in the neck. ?What are the signs or symptoms? ?Pain. The pain may: ?Run from the neck to the arm and hand. ?Be very bad or irritating. ?Get worse when you move your neck. ?Loss of feeling or tingling in your arm or hand. ?Weakness in your arm or hand, in very bad cases. ?How is this treated? ?In many cases, treatment is not needed for this condition. With rest, the condition often gets better over time. If treatment is needed, options may include: ?Wearing a soft neck collar (cervical collar) for short periods of time. ?Doing exercises (physical therapy) to strengthen your neck muscles. ?Taking medicines. ?Having shots (injections) in your spine, in very bad cases. ?Having surgery. This may be needed if other treatments do not help. The type of surgery that is used will depend on the cause of your condition. ?Follow these instructions at home: ?If you have a soft neck collar: ?Wear it as told by your doctor. Take it off only as told by your doctor. ?Ask your doctor if you can take the collar off for cleaning and bathing. If you are allowed to take the collar off for cleaning or bathing: ?Follow instructions from your doctor about how to take off the collar  safely. ?Clean the collar by wiping it with mild soap and water and drying it completely. ?Take out any removable pads in the collar every 1-2 days. Wash them by hand with soap and water. Let them air-dry completely before you put them back in the collar. ?Check your skin under the collar for redness or sores. If you see any, tell your doctor. ?Managing pain ?  ?Take over-the-counter and prescription medicines only as told by your doctor. ?If told, put ice on the painful area. To do this: ?If you have a soft neck collar, take if off as told by your doctor. ?Put ice in a plastic bag. ?Place a towel between your skin and the bag. ?Leave the ice on for 20 minutes, 2-3 times a day. ?Take off the ice if your skin turns bright red. This is very important. If you cannot feel pain, heat, or cold, you have a greater risk of damage to the area. ?If using ice does not help, you can try using heat. Use the heat source that your doctor recommends, such as a moist heat pack or a heating pad. ?Place a towel between your skin and the heat source. ?Leave the heat on for 20-30 minutes. ?Take off the heat if your skin turns bright red. This is very important. If you cannot feel pain, heat, or cold, you have a greater risk of getting burned. ?You may try a gentle neck and shoulder rub (massage). ?Activity ?Rest as needed. ?Return to your normal   activities when your doctor says that it is safe. ?Do exercises as told by your doctor or physical therapist. ?You may have to avoid lifting. Ask your doctor how much you can safely lift. ?General instructions ?Use a flat pillow when you sleep. ?Do not drive while wearing a soft neck collar. If you do not have a soft neck collar, ask your doctor if it is safe to drive while your neck heals. ?Ask your doctor if you should avoid driving or using machines while you are taking your medicine. ?Do not smoke or use any products that contain nicotine or tobacco. If you need help quitting, ask your  doctor. ?Keep all follow-up visits. ?Contact a doctor if: ?Your condition does not get better with treatment. ?Get help right away if: ?Your pain gets worse and medicine does not help. ?You lose feeling or feel weak in your hand, arm, face, or leg. ?You have a high fever. ?Your neck is stiff. ?You cannot control when you poop or pee (have incontinence). ?You have trouble with walking, balance, or talking. ?Summary ?Cervical radiculopathy means that a nerve in the neck is pinched or bruised. ?A nerve can get pinched from a bulging disk, arthritis, an injury to the neck, or other causes. ?Symptoms include pain, tingling, or loss of feeling that goes from the neck to the arm or hand. ?Weakness in your arm or hand can happen in very bad cases. ?Treatment may include resting, wearing a soft neck collar, and doing exercises. You might need to take medicines for pain. In very bad cases, shots or surgery may be needed. ?This information is not intended to replace advice given to you by your health care provider. Make sure you discuss any questions you have with your health care provider. ?Document Revised: 12/17/2020 Document Reviewed: 12/17/2020 ?Elsevier Patient Education ? 2022 [employer] ? ?

## 2021-05-11 NOTE — Assessment & Plan Note (Signed)
Stable on her current regimen. F/U with cards as planned.

## 2021-05-11 NOTE — Assessment & Plan Note (Signed)
TSH checked today.

## 2021-05-11 NOTE — Progress Notes (Addendum)
SUBJECTIVE:   CHIEF COMPLAINT / HPI:   Tingling:  She c/o a tingling sensation in her feet. This started about one month ago on her left foot, and now she is experiencing it intermittently on her right foot. There are no known aggravating or relieving factors. She denies LL weakness or numbness.  Weakness:  She c/o neck pain, intermittent numbness, and weakness in her left arm, which occurs only when she rests on her left arm for a prolonged period. There is associated neck pain radiating to her left arm. She denies any symptoms currently.  Fatigue/Hypothyroidism:C/O tiredness over the past week. This seems to be intermittent. She will like to get blood test done to assess this. Also need f/u for her hypothyroidism. She is compliant with her Synthroid.  CHF/PVD/CKD: No new concerns. Here for follow-up.   PERTINENT  PMH / PSH: PMHx reviewed.  OBJECTIVE:   BP (!) 143/77   Pulse 69   Ht 5\' 5"  (1.651 m)   Wt 208 lb 6.4 oz (94.5 kg)   SpO2 100%   BMI 34.68 kg/m   Physical Exam Vitals and nursing note reviewed.  Neck:     Comments: Tight trapezius muscle with mild tenderness Cardiovascular:     Rate and Rhythm: Normal rate and regular rhythm.     Heart sounds: Normal heart sounds.  Pulmonary:     Effort: Pulmonary effort is normal.     Breath sounds: Normal breath sounds. No wheezing.  Musculoskeletal:     Cervical back: Neck supple. No rigidity. Normal range of motion.     Lumbar back: Normal.     Right lower leg: No edema.     Left lower leg: No edema.  Missing/amputated left 5th toe.    Comments: Mild cervical spine tenderness  Neurological:     General: No focal deficit present.     Cranial Nerves: Cranial nerves 2-12 are intact.     Sensory: Sensation is intact.     Motor: Motor function is intact.     Coordination: Coordination is intact.     Gait: Gait is intact.     Deep Tendon Reflexes: Reflexes are normal and symmetric.     ASSESSMENT/PLAN:   Cervical  radiculopathy Presenting as intermittent left arm numbness depending on positioning. No neurologic deficit on exam. I reviewed and discussed her neck xray done 7 months ago with her (Multilevel degenerative joint disease resulting in mild bilateral neuroforaminal narrowing at C3-4). PT and spine specialist referral discussed. She prefers to hold off while doing home exercise. Continue NSAID and muscle relaxant prn. Consider re-imaging. TSH, Vitamin B12 and CBC checked today. Return precaution discussed. Consider Gabapentin in the future.   Paresthesia ?? Related to medication. Hx of preDm with a recent A1C of 6 I checked her CBC, TSH, Vitamin D and B12 today. She gets A1C checked every 6 months. I will contact her soon with the result. Consider gabapentin if this persists. She agreed with the plan.   Fatigue ?? Chronic fatigue syndrome. She had normal autoimmune work up done in the past. Check TSH, CBC, vitamins level and Cmet today. Continue home graded exercise. I will contact her soon with her results.  Hypothyroidism TSH checked today.   Chronic diastolic CHF (congestive heart failure) (HCC) Stable on her current regimen. F/U with cards as planned.   Peripheral vascular disease ([place]) Stable on her current regimen. F/U with cards as planned.   CKD (chronic kidney disease), stage II Cmet checked  today.   She declined COVID shot. PVC 20 given today.  [name], MD [place]

## 2021-05-11 NOTE — Assessment & Plan Note (Addendum)
??   Related to medication. Hx of preDm with a recent A1C of 6 I checked her CBC, TSH, Vitamin D and B12 today. She gets A1C checked every 6 months. I will contact her soon with the result. Consider gabapentin if this persists. She agreed with the plan.

## 2021-05-11 NOTE — Assessment & Plan Note (Signed)
Cmet checked today.

## 2021-05-11 NOTE — Assessment & Plan Note (Signed)
??   Chronic fatigue syndrome. She had normal autoimmune work up done in the past. Check TSH, CBC, vitamins level and Cmet today. Continue home graded exercise. I will contact her soon with her results.

## 2021-05-12 ENCOUNTER — Other Ambulatory Visit: Payer: Self-pay | Admitting: Family Medicine

## 2021-05-12 ENCOUNTER — Telehealth: Payer: Self-pay | Admitting: Family Medicine

## 2021-05-12 DIAGNOSIS — E559 Vitamin D deficiency, unspecified: Secondary | ICD-10-CM

## 2021-05-12 LAB — CMP14+EGFR
ALT: 26 IU/L (ref 0–32)
AST: 17 IU/L (ref 0–40)
Albumin/Globulin Ratio: 1.5 (ref 1.2–2.2)
Albumin: 4.4 g/dL (ref 3.8–4.9)
Alkaline Phosphatase: 95 IU/L (ref 44–121)
BUN/Creatinine Ratio: 19 (ref 9–23)
BUN: 20 mg/dL (ref 6–24)
Bilirubin Total: 0.2 mg/dL (ref 0.0–1.2)
CO2: 23 mmol/L (ref 20–29)
Calcium: 10.3 mg/dL — ABNORMAL HIGH (ref 8.7–10.2)
Chloride: 100 mmol/L (ref 96–106)
Creatinine, Ser: 1.06 mg/dL — ABNORMAL HIGH (ref 0.57–1.00)
Globulin, Total: 2.9 g/dL (ref 1.5–4.5)
Glucose: 104 mg/dL — ABNORMAL HIGH (ref 70–99)
Potassium: 4.6 mmol/L (ref 3.5–5.2)
Sodium: 139 mmol/L (ref 134–144)
Total Protein: 7.3 g/dL (ref 6.0–8.5)
eGFR: 61 mL/min/{1.73_m2} (ref >59)

## 2021-05-12 LAB — TSH: TSH: 2.8 u[IU]/mL (ref 0.450–4.500)

## 2021-05-12 LAB — CBC WITH DIFFERENTIAL/PLATELET
Basophils Absolute: 0.1 10*3/uL (ref 0.0–0.2)
Basos: 1 %
EOS (ABSOLUTE): 0.2 10*3/uL (ref 0.0–0.4)
Eos: 2 %
Hematocrit: 41.2 % (ref 34.0–46.6)
Hemoglobin: 13.8 g/dL (ref 11.1–15.9)
Immature Grans (Abs): 0 10*3/uL (ref 0.0–0.1)
Immature Granulocytes: 0 %
Lymphocytes Absolute: 2.7 10*3/uL (ref 0.7–3.1)
Lymphs: 36 %
MCH: 25.7 pg — ABNORMAL LOW (ref 26.6–33.0)
MCHC: 33.5 g/dL (ref 31.5–35.7)
MCV: 77 fL — ABNORMAL LOW (ref 79–97)
Monocytes Absolute: 0.4 10*3/uL (ref 0.1–0.9)
Monocytes: 6 %
Neutrophils Absolute: 4.2 10*3/uL (ref 1.4–7.0)
Neutrophils: 55 %
Platelets: 327 10*3/uL (ref 150–450)
RBC: 5.37 x10E6/uL — ABNORMAL HIGH (ref 3.77–5.28)
RDW: 13.1 % (ref 11.7–15.4)
WBC: 7.6 10*3/uL (ref 3.4–10.8)

## 2021-05-12 LAB — VITAMIN B12: Vitamin B-12: >2000 pg/mL — ABNORMAL HIGH (ref 232–1245)

## 2021-05-12 LAB — VITAMIN D 25 HYDROXY (VIT D DEFICIENCY, FRACTURES): Vit D, 25-Hydroxy: 27.5 ng/mL — ABNORMAL LOW (ref 30.0–100.0)

## 2021-05-12 MED ORDER — VITAMIN D (ERGOCALCIFEROL) 1.25 MG (50000 UNIT) PO CAPS: 50000.0000 [IU] | ORAL_CAPSULE | ORAL | 1 refills | Status: DC

## 2021-05-12 NOTE — Telephone Encounter (Signed)
Lab results discussed. Kidney function improved - monitor for now. High vitamin B12 is likely due to the B complex supplements she takes. As discussed with her, this could contribute to her having paresthesia. Also, excess vitamin B levels can cause liver and kidney impairment. I recommended regular MVI and discontinuing B complex for now. She agreed with the plan. Her vitamin D level is also low, even though she takes daily vitamin D supplements. I recommended weekly supplements for eight weeks and f/u lab in 8 weeks. She is to d/c the daily supplements for now. She agreed with the plan. Lab appointment made.

## 2021-05-14 ENCOUNTER — Telehealth: Payer: Self-pay

## 2021-05-14 NOTE — Telephone Encounter (Signed)
Call to patient reference PREP class starting on 06/08/21 T/TH [id number]-730p Initial assessment scheduled for 12/1 at 6pm at [facility]

## 2021-05-17 NOTE — Progress Notes (Signed)
[facility] Summary Report / Loop Recorder 

## 2021-05-18 ENCOUNTER — Encounter: Payer: Self-pay | Admitting: Family Medicine

## 2021-05-19 ENCOUNTER — Other Ambulatory Visit: Payer: Self-pay | Admitting: Family Medicine

## 2021-05-19 DIAGNOSIS — G8929 Other chronic pain: Secondary | ICD-10-CM

## 2021-05-24 ENCOUNTER — Other Ambulatory Visit: Payer: Self-pay

## 2021-05-24 ENCOUNTER — Other Ambulatory Visit: Payer: Self-pay | Admitting: Family Medicine

## 2021-05-24 ENCOUNTER — Ambulatory Visit (HOSPITAL_COMMUNITY)
Admission: RE | Admit: 2021-05-24 | Discharge: 2021-05-24 | Disposition: A | Discharge status: Home / Self Care | Payer: 59 | Source: Ambulatory Visit | Attending: Family Medicine | Admitting: Family Medicine

## 2021-05-24 DIAGNOSIS — M549 Dorsalgia, unspecified: Secondary | ICD-10-CM

## 2021-05-24 DIAGNOSIS — G8929 Other chronic pain: Secondary | ICD-10-CM | POA: Insufficient documentation

## 2021-05-25 DIAGNOSIS — S98132A Complete traumatic amputation of one left lesser toe, initial encounter: Secondary | ICD-10-CM | POA: Insufficient documentation

## 2021-05-25 NOTE — Assessment & Plan Note (Signed)
No change from baseline

## 2021-05-26 ENCOUNTER — Telehealth: Payer: Self-pay | Admitting: Family Medicine

## 2021-05-26 ENCOUNTER — Ambulatory Visit (HOSPITAL_BASED_OUTPATIENT_CLINIC_OR_DEPARTMENT_OTHER): Payer: 59 | Attending: Cardiovascular Disease | Admitting: Cardiovascular Disease

## 2021-05-26 ENCOUNTER — Other Ambulatory Visit: Payer: Self-pay

## 2021-05-26 DIAGNOSIS — M47816 Spondylosis without myelopathy or radiculopathy, lumbar region: Secondary | ICD-10-CM

## 2021-05-26 DIAGNOSIS — G8929 Other chronic pain: Secondary | ICD-10-CM

## 2021-05-26 DIAGNOSIS — Q7649 Other congenital malformations of spine, not associated with scoliosis: Secondary | ICD-10-CM | POA: Insufficient documentation

## 2021-05-26 DIAGNOSIS — M47814 Spondylosis without myelopathy or radiculopathy, thoracic region: Secondary | ICD-10-CM

## 2021-05-26 NOTE — Telephone Encounter (Addendum)
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   PT eval discussed vs spine specialist referral. She will try PT for now. Referral placed.

## 2021-05-27 ENCOUNTER — Telehealth: Payer: Self-pay | Admitting: Cardiovascular Disease

## 2021-05-27 DIAGNOSIS — Z419 Encounter for procedure for purposes other than remedying health state, unspecified: Secondary | ICD-10-CM | POA: Diagnosis not present

## 2021-05-27 NOTE — Telephone Encounter (Signed)
Hey there, looks like this pt. Attempted an at home sleep study but is having issues and would like to do a lab study, can you help?

## 2021-05-27 NOTE — Telephone Encounter (Signed)
Patient called stating she tired to do her sleep a home twice but it didn't work. She states one for the facility needs to be ordered now, she would like to have the set up.

## 2021-06-02 ENCOUNTER — Telehealth: Payer: Self-pay

## 2021-06-02 NOTE — Telephone Encounter (Signed)
Called patient to see if she wants a new ILR implanted or removed.  Patient voiced she would like to know what Dr. Lovena Le thinks and is agreeable to his recommendations.  Advised I will forward to Dr. Lovena Le and will follow up after response is received.

## 2021-06-02 NOTE — Telephone Encounter (Signed)
The patient returned a call about her ILR battery being low. She would like Dr. Tanna Furry recommendation about whether or not she should replace her ILR.  Please advise

## 2021-06-02 NOTE — Telephone Encounter (Signed)
ILR reached [facility] 06/01/21.  Attempted to contact patient to advise ILR @ [facility]. No answer, LMTCB,  Taken out of [facility]. Canceled future apts.

## 2021-06-03 NOTE — Telephone Encounter (Signed)
We do not usually replace ILR. Real question is whether patient would like the device removed.

## 2021-06-04 ENCOUNTER — Telehealth: Payer: Self-pay

## 2021-06-04 NOTE — Telephone Encounter (Signed)
Reached out to patient (VIA text) about next PREP evening class starting on 06/08/21. Pt had concerns reference back issues and potential surgery needed. Asked her to follow up with MD before proceeding. She has been ordered PT so we will hold PREP for now. Asked patient to let me know if she would like to participate in future. She will need an evening class.

## 2021-06-07 NOTE — Telephone Encounter (Signed)
Called patient to advise. Patient would like to have ILR removed. Patient would like to have ILR removed. Will route to scheduler to set up with patient.

## 2021-06-12 ENCOUNTER — Ambulatory Visit: Payer: 59 | Attending: Family Medicine | Admitting: Rehabilitative and Restorative Service Providers"

## 2021-06-12 ENCOUNTER — Other Ambulatory Visit: Payer: Self-pay

## 2021-06-12 ENCOUNTER — Encounter: Payer: Self-pay | Admitting: Rehabilitative and Restorative Service Providers"

## 2021-06-12 DIAGNOSIS — Q7649 Other congenital malformations of spine, not associated with scoliosis: Secondary | ICD-10-CM | POA: Diagnosis not present

## 2021-06-12 DIAGNOSIS — M549 Dorsalgia, unspecified: Secondary | ICD-10-CM | POA: Diagnosis not present

## 2021-06-12 DIAGNOSIS — M47814 Spondylosis without myelopathy or radiculopathy, thoracic region: Secondary | ICD-10-CM | POA: Diagnosis not present

## 2021-06-12 DIAGNOSIS — R293 Abnormal posture: Secondary | ICD-10-CM | POA: Diagnosis not present

## 2021-06-12 DIAGNOSIS — M47816 Spondylosis without myelopathy or radiculopathy, lumbar region: Secondary | ICD-10-CM | POA: Insufficient documentation

## 2021-06-12 DIAGNOSIS — M545 Low back pain, unspecified: Secondary | ICD-10-CM | POA: Diagnosis not present

## 2021-06-12 DIAGNOSIS — G8929 Other chronic pain: Secondary | ICD-10-CM | POA: Diagnosis not present

## 2021-06-12 NOTE — Progress Notes (Deleted)
OUTPATIENT PHYSICAL THERAPY THORACOLUMBAR EVALUATION   Patient Name: Teresa [name] MRN: [id number] DOB:1960-09-18, 60 y.o., female Today's Date: 06/12/2021    Past Medical History:  Diagnosis Date   Allergy    Anemia    Cervical disc disorder with radiculopathy of cervical region 02/10/2016   Chronic lower back pain    Claudication in peripheral vascular disease ([place]) 06/12/2020   Diverticulosis    Ear discomfort 03/11/2016   EKG, abnormal 01/06/2017   Gastritis    GERD (gastroesophageal reflux disease)    H/O hiatal hernia    Hiatal hernia    History of amputation of lesser toe of left foot ([place]) 03/11/2015   History of colon cancer    Hypertension    Hyperthyroidism    hx   Hypothyroidism    Lateral knee pain, left 08/06/2013   Left shoulder pain 05/28/2015   Migraine headache    "a few times/yr" (03/05/2015)   Neck pain 02/11/2014   Neck strain 02/06/2012   Numbness and tingling in right hand 05/16/2018   Osteomyelitis ([place])    [name] 03/05/2015   Osteomyelitis of toe of left foot ([place]) 03/05/2015   Palpitations 01/05/2016   Positive TB test    Snoring 02/15/2021   Stroke [facility])    Throat pain 09/21/2011   Normal CT and US neck march 2013, repeat Neck US in 1 year to follow-up small nodules.  Head CT 2013: normal ? glosspharyngeal neuralgia.  On amitriptyline await [facility] ENt referral.    Tubular adenoma of colon    Tubular adenoma of colon    Ulnar neuropathy at elbow of right upper extremity 06/12/2018   Past Surgical History:  Procedure Laterality Date   ABDOMINAL HYSTERECTOMY  2006   AMPUTATION TOE Left 03/05/2015   Procedure: AMPUTATION LEFT 5TH  TOE;  Surgeon: Wylene Simmer, MD;  Location: [place];  Service: Orthopedics;  Laterality: Left;   APPENDECTOMY  2006   BREAST BIOPSY Left 11/26/2014   CESAREAN SECTION  1979 1986 1990   COLONOSCOPY     GALLBLADDER SURGERY     LOOP RECORDER INSERTION N/A 09/18/2017   Procedure: LOOP RECORDER INSERTION;  Surgeon: Evans Lance, MD;   Location: [facility];  Service: Cardiovascular;  Laterality: N/A;   OPEN REDUCTION INTERNAL FIXATION (ORIF) DISTAL RADIAL FRACTURE Right 09/14/2019   Procedure: OPEN REDUCTION INTERNAL FIXATION (ORIF)  RADIAL AND ULNA FRACTURE;  Surgeon: Iran Planas, MD;  Location: [place];  Service: Orthopedics;  Laterality: Right;   ORIF ULNAR FRACTURE Right 09/14/2019   TEE WITHOUT CARDIOVERSION N/A 09/18/2017   Procedure: TRANSESOPHAGEAL ECHOCARDIOGRAM (TEE);  Surgeon: Pixie Casino, MD;  Location: [facility] ENDOSCOPY;  Service: Cardiovascular;  Laterality: N/A;   TOE AMPUTATION Left 03/05/2015   5th toe   TUBAL LIGATION Bilateral 1990   UPPER GASTROINTESTINAL ENDOSCOPY     Patient Active Problem List   Diagnosis Date Noted   Bertolotti's syndrome 05/26/2021   Cervical radiculopathy 05/11/2021   Fatigue 05/11/2021   Snoring 02/15/2021   CAD in native artery 01/12/2021   Obese 01/11/2021   Peripheral vascular disease ([place]) 06/12/2020   History of forearm fracture 05/01/2020   History of hiatal hernia 05/01/2020   CKD (chronic kidney disease), stage II 01/15/2020   Tubular adenoma of colon 09/14/2019   Chronic diastolic CHF (congestive heart failure) ([place]) 09/14/2019   COVID-19 07/02/2019   Ulnar neuropathy at elbow of right upper extremity 06/12/2018   Migraine 01/12/2018   Hyperlipidemia    History of  CVA (cerebrovascular accident) 09/15/2017   History of cholecystectomy 06/27/2017   Small intestinal bacterial overgrowth 03/23/2017   Paresthesia 01/06/2017   Prediabetes 10/29/2015   Tendinopathy of left rotator cuff 10/27/2015   History of amputation of lesser toe, left ([place]) 03/11/2015   Resistant hypertension    Thyroid nodule 02/12/2013   GERD (gastroesophageal reflux disease) 02/06/2012   History of tobacco abuse 09/21/2011   Hypertension 09/21/2011   Hypothyroidism 09/21/2011    PCP: Kinnie Feil, MD  REFERRING PROVIDER: Kinnie Feil, MD  REFERRING DIAG:  ***  THERAPY DIAG:  No diagnosis found.  ONSET DATE: ***  SUBJECTIVE:                                                                                                                                                                                           SUBJECTIVE STATEMENT: *** PERTINENT HISTORY:  ***  PAIN:  Are you having pain? {yes/no:20286} VAS scale: ***/10 Pain location: *** Pain orientation: {Pain Orientation:25161}  PAIN TYPE: {type:313116} Pain description: {PAIN DESCRIPTION:21022940}  Aggravating factors: *** Relieving factors: ***  PRECAUTIONS: {Therapy precautions:24002}  WEIGHT BEARING RESTRICTIONS {Yes ***/No:24003}  FALLS:  Has patient fallen in last 6 months? {yes/no:20286}, Number of falls: ***  LIVING ENVIRONMENT: Lives with: {[facility] lives with:25569::"lives with their family"} Lives in: {Lives in:25570} Stairs: {yes/no:20286}; {Stairs:24000} Has following equipment at home: {Assistive devices:23999}  OCCUPATION: ***  PLOF: {PLOF:24004}  PATIENT GOALS ***   OBJECTIVE:   DIAGNOSTIC FINDINGS:  ***  PATIENT SURVEYS:  {rehab surveys:24030}  SCREENING FOR RED FLAGS: Bowel or bladder incontinence: {Yes/No:304960894} Spinal tumors: {Yes/No:304960894} Cauda equina syndrome: {Yes/No:304960894} Compression fracture: {Yes/No:304960894} Abdominal aneurysm: {Yes/No:304960894}  COGNITION:  Overall cognitive status: {cognition:24006}     SENSATION:  Light touch: {intact/deficits:24005}  Stereognosis: {intact/deficits:24005}  Hot/Cold: {intact/deficits:24005}  Proprioception: {intact/deficits:24005}  MUSCLE LENGTH: Hamstrings: Right *** deg; Left *** deg Thomas test: Right *** deg; Left *** deg  POSTURE:  ***  PALPATION: ***  LUMBARAROM/PROM  A/PROM A/PROM  06/12/2021  Flexion   Extension   Right lateral flexion   Left lateral flexion   Right rotation   Left rotation    (Blank rows = not tested)  LE AROM/PROM:  A/PROM  Right 06/12/2021 Left 06/12/2021  Hip flexion    Hip extension    Hip abduction    Hip adduction    Hip internal rotation    Hip external rotation    Knee flexion    Knee extension    Ankle dorsiflexion    Ankle plantarflexion    Ankle inversion    Ankle eversion     (Blank rows = not tested)  LE MMT:  MMT Right 06/12/2021 Left 06/12/2021  Hip flexion    Hip extension    Hip abduction    Hip adduction    Hip internal rotation    Hip external rotation    Knee flexion    Knee extension    Ankle dorsiflexion    Ankle plantarflexion    Ankle inversion    Ankle eversion     (Blank rows = not tested)  LUMBAR SPECIAL TESTS:  {lumbar special test:25242}  FUNCTIONAL TESTS:  {Functional tests:24029}  GAIT: Distance walked: *** Assistive device utilized: {Assistive devices:23999} Level of assistance: {Levels of assistance:24026} Comments: ***    TODAY'S TREATMENT  ***   PATIENT EDUCATION:  Education details: *** Person educated: {Person educated:25204} Education method: {Education Method:25205} Education comprehension: {Education Comprehension:25206}   HOME EXERCISE PROGRAM: ***  ASSESSMENT:  CLINICAL IMPRESSION: Patient is a *** y.o. *** who was seen today for physical therapy evaluation and treatment for ***. Objective impairments include {opptimpairments:25111}. These impairments are limiting patient from {activity limitations:25113}. Personal factors including {Personal factors:25162} are also affecting patient's functional outcome. Patient will benefit from skilled PT to address above impairments and improve overall function.  REHAB POTENTIAL: {rehabpotential:25112}  CLINICAL DECISION MAKING: {clinical decision making:25114}  EVALUATION COMPLEXITY: {Evaluation complexity:25115}   GOALS: Goals reviewed with patient? {yes/no:20286}  SHORT TERM GOALS:  STG Name Target Date Goal status  1 *** Baseline:  {follow up:25551} {GOALSTATUS:25110}  2  *** Baseline:  {follow up:25551} {GOALSTATUS:25110}  3 *** Baseline: {follow up:25551} {GOALSTATUS:25110}  4 *** Baseline: {follow up:25551} {GOALSTATUS:25110}  5 *** Baseline: {follow up:25551} {GOALSTATUS:25110}  6 *** Baseline: {follow up:25551} {GOALSTATUS:25110}  7 *** Baseline: {follow up:25551} {GOALSTATUS:25110}   LONG TERM GOALS:   LTG Name Target Date Goal status  1 *** Baseline: {follow up:25551} {GOALSTATUS:25110}  2 *** Baseline: {follow up:25551} {GOALSTATUS:25110}  3 *** Baseline: {follow up:25551} {GOALSTATUS:25110}  4 *** Baseline: {follow up:25551} {GOALSTATUS:25110}  5 *** Baseline: {follow up:25551} {GOALSTATUS:25110}  6 *** Baseline: {follow up:25551} {GOALSTATUS:25110}  7 *** Baseline: {follow up:25551} {GOALSTATUS:25110}   PLAN: PT FREQUENCY: {rehab frequency:25116}  PT DURATION: {rehab duration:25117}  PLANNED INTERVENTIONS: {rehab planned interventions:25118::"Therapeutic exercises","Therapeutic activity","Neuro Muscular re-education","Balance training","Gait training","Patient/Family education","Joint mobilization"}  PLAN FOR NEXT SESSION: ***   America Brown 06/12/2021, 8:51 AM
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
## 2021-06-14 ENCOUNTER — Ambulatory Visit: Payer: 59 | Admitting: Rehabilitative and Restorative Service Providers"

## 2021-06-14 ENCOUNTER — Ambulatory Visit (HOSPITAL_BASED_OUTPATIENT_CLINIC_OR_DEPARTMENT_OTHER): Payer: 59 | Attending: Cardiovascular Disease | Admitting: Cardiovascular Disease

## 2021-06-14 ENCOUNTER — Other Ambulatory Visit: Payer: Self-pay

## 2021-06-14 DIAGNOSIS — G4733 Obstructive sleep apnea (adult) (pediatric): Secondary | ICD-10-CM

## 2021-06-14 DIAGNOSIS — R293 Abnormal posture: Secondary | ICD-10-CM

## 2021-06-14 DIAGNOSIS — E669 Obesity, unspecified: Secondary | ICD-10-CM | POA: Diagnosis not present

## 2021-06-14 DIAGNOSIS — R0683 Snoring: Secondary | ICD-10-CM | POA: Diagnosis not present

## 2021-06-14 DIAGNOSIS — G8929 Other chronic pain: Secondary | ICD-10-CM

## 2021-06-14 DIAGNOSIS — R0681 Apnea, not elsewhere classified: Secondary | ICD-10-CM

## 2021-06-14 DIAGNOSIS — M545 Low back pain, unspecified: Secondary | ICD-10-CM
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
## 2021-06-27 DIAGNOSIS — Z419 Encounter for procedure for purposes other than remedying health state, unspecified: Secondary | ICD-10-CM | POA: Diagnosis not present
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
## 2021-07-03 ENCOUNTER — Ambulatory Visit: Payer: 59 | Attending: Family Medicine

## 2021-07-03 ENCOUNTER — Other Ambulatory Visit: Payer: Self-pay

## 2021-07-03 DIAGNOSIS — M545 Low back pain, unspecified: Secondary | ICD-10-CM | POA: Diagnosis present

## 2021-07-03 DIAGNOSIS — R293 Abnormal posture: Secondary | ICD-10-CM | POA: Insufficient documentation

## 2021-07-03 DIAGNOSIS — G8929 Other chronic pain: Secondary | ICD-10-CM | POA: Insufficient documentation

## 2021-07-04 ENCOUNTER — Encounter (HOSPITAL_BASED_OUTPATIENT_CLINIC_OR_DEPARTMENT_OTHER): Payer: Self-pay | Admitting: Cardiovascular Disease

## 2021-07-04 NOTE — Procedures (Signed)
Patient Name: Teresa [name], Teresa [name] Study Date: 06/14/2021 Gender: Female D.O.B: [birth year] Age (years): 60 Referring Provider: Skeet Latch Height (inches): 57 Interpreting Physician: Shelva Majestic MD, ABSM Weight (lbs): 204 RPSGT: Zadie Rhine BMI: 34 MRN: [id number] Neck Size: 15.50  CLINICAL INFORMATION Sleep Study Type: NPSG  Indication for sleep study: Obesity, OSA, Snoring  Epworth Sleepiness Score: 3  Most recent polysomnogram dated 03/30/2016 revealed an AHI of 1.7/h and RDI of 5.9/h.  SLEEP STUDY TECHNIQUE As per the AASM Manual for the Scoring of Sleep and Associated Events v2.3 (April 2016) with a hypopnea requiring 4% desaturations.  The channels recorded and monitored were frontal, central and occipital EEG, electrooculogram (EOG), submentalis EMG (chin), nasal and oral airflow, thoracic and abdominal Breed motion, anterior tibialis EMG, snore microphone, electrocardiogram, and pulse oximetry.  MEDICATIONS aspirin 81 MG chewable tablet Carboxymethylcellulose Sodium (EYE DROPS OP) carvedilol (COREG) 25 MG tablet chlorthalidone (HYGROTON) 25 MG tablet (Expired) hydrALAZINE (APRESOLINE) 100 MG tablet levothyroxine (SYNTHROID) 175 MCG tablet lidocaine (LIDODERM) 5 % naproxen (NAPROSYN) 500 MG tablet Probiotic Product (PROBIOTIC DAILY PO) RESTASIS 0.05 % ophthalmic emulsion Semaglutide,0.25 or 0.5MG /DOS, (OZEMPIC, 0.25 OR 0.5 MG/DOSE,) 2 MG/1.5ML SOPN spironolactone (ALDACTONE) 50 MG tablet tiZANidine (ZANAFLEX) 2 MG tablet Vitamin D, Ergocalciferol, (DRISDOL) 1.25 MG (50000 UNIT) CAPS capsule Medications self-administered by patient taken the night of the study : CARVEDILOL, IBUPROFEN  SLEEP ARCHITECTURE The study was initiated at 10:38:55 PM and ended at 4:59:45 AM.  Sleep onset time was 48.3 minutes and the sleep efficiency was 74.3%%. The total sleep time was 283 minutes.  Stage REM latency was 183.0 minutes.  The patient spent 3.9%% of the night  in stage N1 sleep, 75.8%% in stage N2 sleep, 1.1%% in stage N3 and 19.3% in REM.  Alpha intrusion was absent.  Supine sleep was 37.28%.  RESPIRATORY PARAMETERS The overall apnea/hypopnea index (AHI) was 8.9 per hour. The respiratory disturbance index (RDI) was 16.3/h.  There were 27 total apneas, including 25 obstructive, 2 central and 0 mixed apneas. There were 15 hypopneas and 35 RERAs.  The AHI during Stage REM sleep was 6.6 per hour.  AHI while supine was 0.0 per hour. Slightly reduced sleep efficiency at 74.3% with wake after sleep onset (WASO 49.6 minutes.  The mean oxygen saturation was 94.2%. The minimum SpO2 during sleep was 89.0%.  Moderate snoring was noted during this study.  CARDIAC DATA The 2 lead EKG demonstrated sinus rhythm. The mean heart rate was 55.5 beats per minute. Other EKG findings include: None.  LEG MOVEMENT DATA The total PLMS were 0 with a resulting PLMS index of 0.0. Associated arousal with leg movement index was 0.0 .  IMPRESSIONS - Mild obstructive sleep apnea occurred during this study (AHI 8.9/h; RDI 16.3/h). - No significant central sleep apnea occurred during this study (CAI  0.4/h). - The patient had minimal  oxygen desaturation to a nadir 89.0%. - The patient snored with moderate snoring volume. - No significant cardiac abnormalities were noted during this study; very rare isolated PVC - Clinically significant periodic limb movements did not occur during sleep. No significant associated arousals.  DIAGNOSIS - Obstructive Sleep Apnea (G47.33)  RECOMMENDATIONS - In this patient with cardiovascular co-morbidities recommend therapeutic CPAP titration to determine optimal pressure required to alleviate sleep disordered breathing; can initiate Auto-PAP with EPR of 3 at 6 - 16 cm of water. - Effort should be made to optimize nasal and oropharyngeal patency. - Avoid alcohol, sedatives and other CNS depressants that may  worsen sleep apnea and disrupt  normal sleep architecture. - Sleep hygiene should be reviewed to assess factors that may improve sleep quality. - Weight management (BMI 34) and regular exercise should be initiated or continued if appropriate.  [Electronically signed] 07/04/2021 05:38 PM  Shelva Majestic MD, [facility], [place], American Board of Sleep Medicine   NPI: [id number]  [place] PH: [phone]   FX: [phone] [place]

## 2021-07-05 ENCOUNTER — Other Ambulatory Visit: Payer: Medicaid Other

## 2021-07-05 ENCOUNTER — Other Ambulatory Visit: Payer: Self-pay

## 2021-07-05 ENCOUNTER — Ambulatory Visit: Payer: 59

## 2021-07-05 ENCOUNTER — Encounter: Payer: Self-pay | Admitting: Family Medicine

## 2021-07-05 DIAGNOSIS — E559 Vitamin D deficiency, unspecified: Secondary | ICD-10-CM

## 2021-07-05 DIAGNOSIS — R293 Abnormal posture: Secondary | ICD-10-CM

## 2021-07-05 DIAGNOSIS — M545 Low back pain, unspecified: Secondary | ICD-10-CM | POA: Diagnosis not present

## 2021-07-05 DIAGNOSIS — G4733 Obstructive sleep apnea (adult) (pediatric): Secondary | ICD-10-CM | POA: Insufficient documentation

## 2021-07-06 LAB — VITAMIN D 25 HYDROXY (VIT D DEFICIENCY, FRACTURES): Vit D, 25-Hydroxy: 46.1 ng/mL (ref 30.0–100.0)
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
## 2021-07-10 ENCOUNTER — Ambulatory Visit: Payer: 59 | Admitting: Physical Therapy

## 2021-07-12 ENCOUNTER — Other Ambulatory Visit: Payer: Self-pay | Admitting: Family Medicine

## 2021-07-12 ENCOUNTER — Ambulatory Visit: Payer: 59

## 2021-07-12 ENCOUNTER — Other Ambulatory Visit: Payer: Self-pay

## 2021-07-12 DIAGNOSIS — M545 Low back pain, unspecified: Secondary | ICD-10-CM | POA: Diagnosis not present

## 2021-07-12 DIAGNOSIS — R293 Abnormal posture: Secondary | ICD-10-CM

## 2021-07-12 DIAGNOSIS — G8929 Other chronic pain: Secondary | ICD-10-CM

## 2021-07-12 NOTE — Patient Instructions (Signed)

## 2021-07-15 ENCOUNTER — Telehealth: Payer: Self-pay | Admitting: *Deleted

## 2021-07-15 NOTE — Telephone Encounter (Signed)
Patient returned a call and was given her sleep study results and recommendations. She agrees to proceed with APAP.

## 2021-07-15 NOTE — Telephone Encounter (Signed)
-----   Message from [name], MD sent at 07/04/2021  5:45 PM EST ----- Teresa [name],plea+se contact pt and can initiate Auto-PAP

## 2021-07-17 ENCOUNTER — Ambulatory Visit: Payer: 59 | Admitting: Rehabilitative and Restorative Service Providers"
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
## 2021-07-25 ENCOUNTER — Encounter: Payer: Self-pay | Admitting: Family Medicine

## 2021-07-26 ENCOUNTER — Encounter: Payer: Self-pay | Admitting: Internal Medicine

## 2021-07-26 ENCOUNTER — Ambulatory Visit (INDEPENDENT_AMBULATORY_CARE_PROVIDER_SITE_OTHER): Payer: 59 | Admitting: Internal Medicine

## 2021-07-26 ENCOUNTER — Other Ambulatory Visit: Payer: Self-pay

## 2021-07-26 VITALS — BP 132/74 | HR 67 | Ht 65.0 in | Wt 212.0 lb

## 2021-07-26 DIAGNOSIS — I639 Cerebral infarction, unspecified: Secondary | ICD-10-CM | POA: Diagnosis not present

## 2021-07-26 NOTE — Progress Notes (Signed)
HPI Teresa [name] returns today to have her ILR removed. She is a pleasant 61 yo woman with a h/o HTN, dyslipidemia, and diastolic heart failure who had a cryptogenic stroke and underwent ILR insertion over 3 years ago. Her ILR has reached RRT. She has not had any cardiac arrhythmias.  Allergies  Allergen Reactions   Amlodipine Swelling    Leg swelling   Bactrim [Sulfamethoxazole-Trimethoprim] Anaphylaxis   Clonidine Derivatives Swelling and Other (See Comments)    Facial swelling   Lisinopril Swelling    Lip swelling   Statins Swelling    Body and muscle aches and face/lip swelling on Statins (Lipitor, Pravachol)   Aspirin Nausea Only    Doses higher than 81mg  only   Latex Rash    NO POWDERED GLOVES!!     Current Outpatient Medications  Medication Sig Dispense Refill   aspirin 81 MG chewable tablet Chew 1 tablet (81 mg total) by mouth daily. 30 tablet 0   carvedilol (COREG) 25 MG tablet TAKE 1 TABLET BY MOUTH TWICE DAILY WITH MEALS 180 tablet 1   hydrALAZINE (APRESOLINE) 100 MG tablet TAKE 1 TABLET BY MOUTH THREE TIMES DAILY 270 tablet 1   levothyroxine (SYNTHROID) 175 MCG tablet Take 1 tablet (175 mcg total) by mouth daily before breakfast. 90 tablet 1   naproxen (NAPROSYN) 500 MG tablet Take 1 tablet (500 mg total) by mouth 2 (two) times daily as needed. 30 tablet 0   Probiotic Product (PROBIOTIC DAILY PO) Take 1 tablet by mouth.     RESTASIS 0.05 % ophthalmic emulsion Place 1 drop into both eyes 2 (two) times daily.     spironolactone (ALDACTONE) 50 MG tablet Take 1 tablet by mouth once daily 90 tablet 1   tiZANidine (ZANAFLEX) 2 MG tablet Take 1 tablet (2 mg total) by mouth every 12 (twelve) hours as needed for muscle spasms. 30 tablet 1   chlorthalidone (HYGROTON) 25 MG tablet Take 1 tablet (25 mg total) by mouth daily. 90 tablet 3   Current Facility-Administered Medications  Medication Dose Route Frequency Provider Last Rate Last Admin   olopatadine (PATANOL) 0.1 %  ophthalmic solution 1 drop  1 drop Both Eyes BID McDiarmid, Blane Ohara, MD         Past Medical History:  Diagnosis Date   Allergy    Anemia    Cervical disc disorder with radiculopathy of cervical region 02/10/2016   Chronic lower back pain    Claudication in peripheral vascular disease ([place]) 06/12/2020   Diverticulosis    Ear discomfort 03/11/2016   EKG, abnormal 01/06/2017   Gastritis    GERD (gastroesophageal reflux disease)    H/O hiatal hernia    Hiatal hernia    History of amputation of lesser toe of left foot ([place]) 03/11/2015   History of colon cancer    Hypertension    Hyperthyroidism    hx   Hypothyroidism    Lateral knee pain, left 08/06/2013   Left shoulder pain 05/28/2015   Migraine headache    "a few times/yr" (03/05/2015)   Neck pain 02/11/2014   Neck strain 02/06/2012   Numbness and tingling in right hand 05/16/2018   Osteomyelitis ([place])    [name] 03/05/2015   Osteomyelitis of toe of left foot ([place]) 03/05/2015   Palpitations 01/05/2016   Positive TB test    Snoring 02/15/2021   Stroke ([place])    Throat pain 09/21/2011   Normal CT and US neck march 2013, repeat Neck  US in 1 year to follow-up small nodules.  Head CT 2013: normal ? glosspharyngeal neuralgia.  On amitriptyline await [facility] ENt referral.    Tubular adenoma of colon    Tubular adenoma of colon    Ulnar neuropathy at elbow of right upper extremity 06/12/2018    ROS:   All systems reviewed and negative except as noted in the HPI.   Past Surgical History:  Procedure Laterality Date   ABDOMINAL HYSTERECTOMY  2006   AMPUTATION TOE Left 03/05/2015   Procedure: AMPUTATION LEFT 5TH  TOE;  Surgeon: Wylene Simmer, MD;  Location: [facility];  Service: Orthopedics;  Laterality: Left;   APPENDECTOMY  2006   BREAST BIOPSY Left 11/26/2014   CESAREAN SECTION  1979 1986 1990   COLONOSCOPY     GALLBLADDER SURGERY     LOOP RECORDER INSERTION N/A 09/18/2017   Procedure: LOOP RECORDER INSERTION;  Surgeon: Evans Lance, MD;   Location: [facility];  Service: Cardiovascular;  Laterality: N/A;   OPEN REDUCTION INTERNAL FIXATION (ORIF) DISTAL RADIAL FRACTURE Right 09/14/2019   Procedure: OPEN REDUCTION INTERNAL FIXATION (ORIF)  RADIAL AND ULNA FRACTURE;  Surgeon: Iran Planas, MD;  Location: [facility];  Service: Orthopedics;  Laterality: Right;   ORIF ULNAR FRACTURE Right 09/14/2019   TEE WITHOUT CARDIOVERSION N/A 09/18/2017   Procedure: TRANSESOPHAGEAL ECHOCARDIOGRAM (TEE);  Surgeon: Pixie Casino, MD;  [facility] END[facility];  Service: Cardiovascular;  Laterality: N/A;   TOE AMPUTATION Left 03/05/2015   5th toe   TUBAL LIGATION Bilateral 1990   UPPER GASTROINTESTINAL ENDOSCOPY       Family History  Problem Relation Age of Onset   Heart disease Mother        CAB age 75   Diabetes Mother    Cancer Father 59       pancreatic cancer   Stroke Father    Diabetes Sister    Kidney disease Sister        renal failure   Diabetes Brother    Kidney disease Brother        renal failure   Heart disease Other    Diabetes Other    Breast cancer Maternal Aunt        per pt aunts and uncles died of cancer not sure the type   Breast cancer Paternal Aunt    Esophageal cancer Neg Hx    Rectal cancer Neg Hx    Stomach cancer Neg Hx    Thyroid disease Neg Hx      Social History   Socioeconomic History   Marital status: Single    Spouse name: Not on file   Number of children: 3   Years of education: Not on file   Highest education level: Not on file  Occupational History   Occupation: cosmetolgist  Tobacco Use   Smoking status: Former    Packs/day: 1.00    Years: 36.00    Pack years: 36.00    Types: Cigarettes    Quit date: 09/23/2017    Years since quitting: 3.8   Smokeless tobacco: Never   Tobacco comments:    Tobacco info given 03/01/17  Vaping Use   Vaping Use: Former  Substance and Sexual Activity   Alcohol use: No   Drug use: No   Sexual activity: Not on file  Other Topics Concern   Not on  file  Social History Narrative   Lives with two adult children.  Another daughter lives in [place].  Cosmetologist.  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  Likes to fish and play bingo.  Goes to church.   Social Determinants of Health   Financial Resource Strain: Not on file  Food Insecurity: Not on file  Transportation Needs: Not on file  Physical Activity: Not on file  Stress: Not on file  Social Connections: Not on file  Intimate Partner Violence: Not on file     BP 132/74    Pulse 67    Ht 5\' 5"  (1.651 m)    Wt 212 lb (96.2 kg)    SpO2 97%    BMI 35.28 kg/m   Physical Exam:  Well appearing 61 yo woman, NAD HEENT: Unremarkable Neck:  7 cm JVD, no thyromegally Lymphatics:  No adenopathy Back:  No CVA tenderness Lungs:  Clear with no wheezes HEART:  Regular rate rhythm, no murmurs, no rubs, no clicks Abd:  soft, positive bowel sounds, no organomegally, no rebound, no guarding Ext:  2 plus pulses, no edema, no cyanosis, no clubbing Skin:  No rashes no nodules Neuro:  CN II through XII intact, motor grossly intact  DEVICE  Normal device function.  See PaceArt for details. RRT  Assess/Plan:  Cryptogenic stroke - she has worn a cardiac monitor and had no atrial fib. Her ILR is at RRT. I Have discussed the pro's and con's of device removal and she wishes to proceed.  HTN - her bp is reasonably well controlled.  EP Procedure Note  Procedure performed: ILR removal  Preoperative diagnosis: cryptogenic stroke  Postoperative diagnosis: same with the ILR at RRT  Description of the Procedure: after informed consent was obtained, the patient was prepped and draped in a sterile fashion. 10 cc of lidocaine was infiltrated. A 1.5 cm incision was carried out over the pectoral region. A combination of blunt and sharp dissection was carried out. The old ILR was grasped. A combination of sharp and blunt dissection was carried out. The ILR was removed in total. Benzoin and steristrips were painted on  the skin. A bandage was applied. Hemostasis assured with pressure.   Complications: none immediately.   Conclusion: successful ILR removal without complication.   Teresa Overlie Austin Pongratz,MD

## 2021-07-26 NOTE — Patient Instructions (Signed)
Medication Instructions:  Your physician recommends that you continue on your current medications as directed. Please refer to the Current Medication list given to you today.  Labwork: None ordered.  Testing/Procedures: None ordered.  Follow-Up:  Your physician wants you to follow-up in: As needed with Dr. Lovena Le.     Implantable Loop Recorder Removal, Care After This sheet gives you information about how to care for yourself after your procedure. Your health care provider may also give you more specific instructions. If you have problems or questions, contact your health care provider. What can I expect after the procedure? After the procedure, it is common to have: Soreness or discomfort near the incision. Some swelling or bruising near the incision.  Follow these instructions at home: Incision care   Leave your outer dressing on for 72 hours.  After 72 hours you can remove your outer dressing and shower. Leave adhesive strips in place. These skin closures may need to stay in place for 1-2 weeks. If adhesive strip edges start to loosen and curl up, you may trim the loose edges.  You may remove the strips if they have not fallen off after 2 weeks. Check your incision area every day for signs of infection. Check for: Redness, swelling, or pain. Fluid or blood. Warmth. Pus or a bad smell. Do not take baths, swim, or use a hot tub until your incision is completely healed. If your wound site starts to bleed apply pressure.      If you have any questions/concerns please call the device clinic at [phone].  Activity  Return to your normal activities.  Contact a health care provider if: You have redness, swelling, or pain around your incision. You have a fever.

## 2021-07-28 DIAGNOSIS — Z419 Encounter for procedure for purposes other than remedying health state, unspecified: Secondary | ICD-10-CM | POA: Diagnosis not present

## 2021-07-29 ENCOUNTER — Telehealth: Payer: Self-pay

## 2021-07-29 NOTE — Telephone Encounter (Signed)
The patient states she took her outer dressing off but one of the steri strips fell off. She put a guaze on with tape. I asked her was it bleeding and she states NO. I asked her was it drainging? She answered no. Per Benjamine Mola, rn the patient is fine and to make sure she come for her follow up visit.

## 2021-08-03 ENCOUNTER — Other Ambulatory Visit: Payer: Self-pay | Admitting: Family Medicine

## 2021-08-03 ENCOUNTER — Other Ambulatory Visit: Payer: Self-pay | Admitting: Cardiovascular Disease

## 2021-08-09 DIAGNOSIS — G4733 Obstructive sleep apnea (adult) (pediatric): Secondary | ICD-10-CM | POA: Diagnosis not present

## 2021-08-23 ENCOUNTER — Ambulatory Visit (INDEPENDENT_AMBULATORY_CARE_PROVIDER_SITE_OTHER): Payer: 59 | Admitting: Cardiovascular Disease

## 2021-08-23 ENCOUNTER — Other Ambulatory Visit: Payer: Self-pay

## 2021-08-23 ENCOUNTER — Encounter (HOSPITAL_BASED_OUTPATIENT_CLINIC_OR_DEPARTMENT_OTHER): Payer: Self-pay | Admitting: Cardiovascular Disease

## 2021-08-23 VITALS — BP 136/74 | HR 66 | Ht 65.0 in | Wt 208.9 lb

## 2021-08-23 DIAGNOSIS — Z8673 Personal history of transient ischemic attack (TIA), and cerebral infarction without residual deficits: Secondary | ICD-10-CM

## 2021-08-23 DIAGNOSIS — E7800 Pure hypercholesterolemia, unspecified: Secondary | ICD-10-CM | POA: Diagnosis not present

## 2021-08-23 DIAGNOSIS — I10 Essential (primary) hypertension: Secondary | ICD-10-CM | POA: Diagnosis not present

## 2021-08-23 NOTE — Assessment & Plan Note (Signed)
Blood pressure has been much better controlled.  She is making many healthy lifestyle changes.  Continue carvedilol, chlorthalidone, hydralazine and spironolactone.

## 2021-08-23 NOTE — Assessment & Plan Note (Signed)
Asymptomatic.  She is doing a great job with diet and exercise.  Continue aspirin.  She is trying to work on her lipids with diet.  We will recheck in a few months.

## 2021-08-23 NOTE — Progress Notes (Signed)
Cardiology Office Note   Date:  08/23/2021   ID:  Teresa [name], DOB [birth year], MRN [id number]  PCP:  Kinnie Feil, MD  Cardiologist:   Skeet Latch, MD   No chief complaint on file.    History of Present Illness: Teresa [name] is a 61 y.o. female with stroke, hypertension, OSA on CPAP, palpitations, moderate LVH, and hyperthyroidism who presents for follow up.  Teresa [name] saw [name], MD, on 01/05/16.  At that appointment she reported occasional palpitations and burning in her chest.  She had an echo on 7/13 showed LVEF 55-60% with grade 2 diastolic dysfunction and mild MR.  Teresa [name] reports one year of palpitations and chest pain that occurs sporadically.  She was referred for [facility] 02/24/16 that revealed LVEF 47% and no ischemia.  At her appointment on 01/2016 her blood pressure was elevated so she was switched from metoprolol to carvedilol.  She was subsequently diagnosed with cervicalgia.  Teresa [name] presented 08/2017 with cryptogenic embolic stroke.  Echo at that time revealed LVEF 60 to 65% with grade 1 diastolic dysfunction and moderate aortic regurgitation She had a TEE that was negative for left atrial appendage thrombus and her aortic regurgitation was noted to be mild.  A loop recorder was implanted and no significant arrhythmias have been recorded thus far.   Amlodipine was discontinued due to swelling.  She was started on chlorthalidone.  Hydralazine was switched from qid to tid.  She was referred to pharmacy to start [place].  She saw our pharmacists 01/2018 and switched [place] to [place].   Her blood pressure was poorly controlled, so Carvedilol was increased and HCTZ was added. She followed up with her pharmacist and HCTZ was switched to chlorthalidone. Spironolactone was added. Her PCP noted she had trouble affording her medicine.  She had episode of palpitations that lasted for several hours.  This was since her most recent ILR report.  Thus far there have been  no significant arrhythmias on her loop recorder.  She had renal artery Dopplers 06/2020 that were negative for renal artery stenosis. She was working on diet and exercise and lost 10 pounds. She was unable to participate in PREP because she had to care for her granddaughter.   At her last appointment her blood pressure was much better controlled.  We discussed starting a PCSK9 inhibitor and she wanted to think about it.  She saw Dr. Lovena Le 06/2021 to have her ILR removed.  She started using a CPAP machine last month.  She doesn't like using it but notes that her breathing and energy have improved. She feels good with exercise and has no e She joined gym and has been exercising regularly.  She feels good with exercise.  She hasn't taken her BP medication yet.  At home her BP has been running 465-035 systolic.  She notes tingling in her feet.  She has chronic back pain.  She denies any exertional chest pain or shortness of breath.  She has no lower extremity edema, orthopnea, or PND.  Past Medical History:  Diagnosis Date   Allergy    Anemia    Cervical disc disorder with radiculopathy of cervical region 02/10/2016   Chronic lower back pain    Claudication in peripheral vascular disease ([place]) 06/12/2020   Diverticulosis    Ear discomfort 03/11/2016   EKG, abnormal 01/06/2017   Gastritis    GERD (gastroesophageal reflux disease)    H/O hiatal hernia    Hiatal hernia  History of amputation of lesser toe of left foot ([place]) 03/11/2015   History of colon cancer    Hypertension    Hyperthyroidism    hx   Hypothyroidism    Lateral knee pain, left 08/06/2013   Left shoulder pain 05/28/2015   Migraine headache    "a few times/yr" (03/05/2015)   Neck pain 02/11/2014   Neck strain 02/06/2012   Numbness and tingling in right hand 05/16/2018   Osteomyelitis ([place])    [name] 03/05/2015   Osteomyelitis of toe of left foot ([place]) 03/05/2015   Palpitations 01/05/2016   Positive TB test    Snoring 02/15/2021    Stroke [facility])    Throat pain 09/21/2011   Normal CT and US neck march 2013, repeat Neck US in 1 year to follow-up small nodules.  Head CT 2013: normal ? glosspharyngeal neuralgia.  On amitriptyline await [facility] ENt referral.    Tubular adenoma of colon    Tubular adenoma of colon    Ulnar neuropathy at elbow of right upper extremity 06/12/2018    Past Surgical History:  Procedure Laterality Date   ABDOMINAL HYSTERECTOMY  2006   AMPUTATION TOE Left 03/05/2015   Procedure: AMPUTATION LEFT 5TH  TOE;  Surgeon: Wylene Simmer, MD;  Location: [place];  Service: Orthopedics;  Laterality: Left;   APPENDECTOMY  2006   BREAST BIOPSY Left 11/26/2014   CESAREAN SECTION  1979 1986 1990   COLONOSCOPY     GALLBLADDER SURGERY     LOOP RECORDER INSERTION N/A 09/18/2017   Procedure: LOOP RECORDER INSERTION;  Surgeon: Evans Lance, MD;  Location: [place] [facility];  Service: Cardiovascular;  Laterality: N/A;   OPEN REDUCTION INTERNAL FIXATION (ORIF) DISTAL RADIAL FRACTURE Right 09/14/2019   Procedure: OPEN REDUCTION INTERNAL FIXATION (ORIF)  RADIAL AND ULNA FRACTURE;  Surgeon: Iran Planas, MD;  Location: [place];  Service: Orthopedics;  Laterality: Right;   ORIF ULNAR FRACTURE Right 09/14/2019   TEE WITHOUT CARDIOVERSION N/A 09/18/2017   Procedure: TRANSESOPHAGEAL ECHOCARDIOGRAM (TEE);  Surgeon: Pixie Casino, MD;  Location: [facility] ENDOSCOPY;  Service: Cardiovascular;  Laterality: N/A;   TOE AMPUTATION Left 03/05/2015   5th toe   TUBAL LIGATION Bilateral 1990   UPPER GASTROINTESTINAL ENDOSCOPY       Current Outpatient Medications  Medication Sig Dispense Refill   aspirin 81 MG chewable tablet Chew 1 tablet (81 mg total) by mouth daily. 30 tablet 0   carvedilol (COREG) 25 MG tablet TAKE 1 TABLET BY MOUTH TWICE DAILY WITH MEALS 180 tablet 1   chlorthalidone (HYGROTON) 25 MG tablet Take 1 tablet by mouth once daily 90 tablet 1   hydrALAZINE (APRESOLINE) 100 MG tablet TAKE 1 TABLET BY MOUTH THREE TIMES DAILY 270  tablet 1   levothyroxine (SYNTHROID) 175 MCG tablet Take 1 tablet (175 mcg total) by mouth daily before breakfast. 90 tablet 1   naproxen (NAPROSYN) 500 MG tablet Take 1 tablet (500 mg total) by mouth 2 (two) times daily as needed. 30 tablet 0   Probiotic Product (PROBIOTIC DAILY PO) Take 1 tablet by mouth.     RESTASIS 0.05 % ophthalmic emulsion Place 1 drop into both eyes 2 (two) times daily.     spironolactone (ALDACTONE) 50 MG tablet Take 1 tablet by mouth once daily 90 tablet 1   tiZANidine (ZANAFLEX) 2 MG tablet Take 1 tablet (2 mg total) by mouth every 12 (twelve) hours as needed for muscle spasms. 30 tablet 1   Current Facility-Administered Medications  Medication  Dose Route Frequency Provider Last Rate Last Admin   olopatadine (PATANOL) 0.1 % ophthalmic solution 1 drop  1 drop Both Eyes BID McDiarmid, Blane Ohara, MD        Allergies:   Amlodipine, Bactrim [sulfamethoxazole-trimethoprim], Clonidine derivatives, Lisinopril, Statins, Aspirin, and Latex    Social History:  The patient  reports that she quit smoking about 3 years ago. Her smoking use included cigarettes. She has a 36.00 pack-year smoking history. She has never used smokeless tobacco. She reports that she does not drink alcohol and does not use drugs.   Family History:  The patient's family history includes Breast cancer in her maternal aunt and paternal aunt; Cancer (age of onset: 58) in her father; Diabetes in her brother, mother, sister, and another family member; Heart disease in her mother and another family member; Kidney disease in her brother and sister; Stroke in her father.    ROS:   Please see the history of present illness. (+) Chest pain, central and inferior to left breast (+) Neck and shoulder pain (+) Hot flashes with secondary shortness of breath All other systems are reviewed and negative.     PHYSICAL EXAM: VS:  BP 136/74    Pulse 66    Ht 5\' 5"  (1.651 m)    Wt 208 lb 14.4 oz (94.8 kg)    SpO2 97%     BMI 34.76 kg/m  , BMI Body mass index is 34.76 kg/m. GENERAL:  Well appearing HEENT: Pupils equal round and reactive, fundi not visualized, oral mucosa unremarkable NECK:  No jugular venous distention, waveform within normal limits, carotid upstroke brisk and symmetric, no bruits LUNGS:  Clear to auscultation bilaterally HEART:  RRR.  PMI not displaced or sustained,S1 and S2 within normal limits, no S3, no S4, no clicks, no rubs, no murmurs ABD:  Flat, positive bowel sounds normal in frequency in pitch, no bruits, no rebound, no guarding, no midline pulsatile mass, no hepatomegaly, no splenomegaly EXT:  2+ DP, absent TP bilaterally.  2+ radial and carotid pulses.  No edema, no cyanosis no clubbing SKIN:  No rashes no nodules NEURO:  Cranial nerves II through XII grossly intact, motor grossly intact throughout PSYCH:  Cognitively intact, oriented to person place and time   EKG:   02/15/2021: Sinus rhythm. Rate 71 bpm. Inferolateral T wave inversions. PVC. 10/19/2020: EKG is not ordered today. 06/12/2020: Sinus rhythm.  Rate 78 bpm.  LVH with repolarization abnormalities.  Left atrial enlargement. 02/11/16: sinus bradycardia.  Rate 59 bpm.  Inferolateral TWI, concerning for inferolateral ischemia.  Unchanged from 03/09/15.  Renal artery Doppler 06/2020: Renal:     Right: Normal size right kidney. Normal right Resisitive Index.         Normal cortical thickness of right kidney. No evidence of         right renal artery stenosis. RRV flow present. Cyst(s) noted.         See measurement above.  Left:  Normal size of left kidney. Normal left Resistive Index.         Normal cortical thickness of the left kidney. No evidence of         left renal artery stenosis. LRV flow present.   Echo 09/16/17: Study Conclusions   - Left ventricle: The cavity size was normal. There was moderate   concentric hypertrophy. Systolic function was normal. The   estimated ejection fraction was in the range of 60%  to 65%. [place]   motion was normal;  there were no regional Sissel motion   abnormalities. Doppler parameters are consistent with abnormal   left ventricular relaxation (grade 1 diastolic dysfunction).   Doppler parameters are consistent with elevated ventricular   end-diastolic filling pressure. - Aortic valve: There was moderate regurgitation. - Mitral valve: Calcified annulus. Mildly thickened leaflets .   There was mild regurgitation. - Left atrium: The atrium was mildly dilated. - Right ventricle: Systolic function was normal. - Tricuspid valve: There was mild regurgitation. - Pulmonic valve: There was trivial regurgitation. - Pulmonary arteries: The main pulmonary artery was normal-sized.   Systolic pressure was within the normal range. - Inferior vena cava: The vessel was normal in size. - Pericardium, extracardiac: There was no pericardial effusion.  Lexiscan Myoview 02/24/16: Nuclear stress EF: 47% by computer calculations. Visually , the EF is normal and is around 55-60%. There was no ST segment deviation noted during stress. The study is normal. This is a low risk study.   Recent Labs: 05/11/2021: ALT 26; BUN 20; Creatinine, Ser 1.06; Hemoglobin 13.8; Platelets 327; Potassium 4.6; Sodium 139; TSH 2.800    Lipid Panel    Component Value Date/Time   CHOL 141 01/12/2021 1105   TRIG 68 01/12/2021 1105   HDL 36 (L) 01/12/2021 1105   CHOLHDL 3.9 01/12/2021 1105   CHOLHDL 4.3 09/15/2017 1217   VLDL 13 09/15/2017 1217   LDLCALC 91 01/12/2021 1105      Wt Readings from Last 3 Encounters:  08/23/21 208 lb 14.4 oz (94.8 kg)  07/26/21 212 lb (96.2 kg)  06/14/21 204 lb (92.5 kg)      ASSESSMENT AND PLAN: Hypertension Blood pressure has been much better controlled.  She is making many healthy lifestyle changes.  Continue carvedilol, chlorthalidone, hydralazine and spironolactone.    CAD in native artery Asymptomatic.  She is doing a great job with diet and exercise.   Continue aspirin.  She is trying to work on her lipids with diet.  We will recheck in a few months.  Obstructive sleep apnea She is doing a good job with her CPAP.  She was encouraged to keep using it.  Hyperlipidemia She has not tolerated statins.  She would like to avoid any cholesterol medications and is doing a really wonderful job with diet and exercise.  Recheck lipids and a CMP in a few months.  History of CVA (cerebrovascular accident) Stable.  Blood pressure is well controlled and she continues to work on her lipids.  Resistant hypertension Blood pressure is now better controlled.  It is a little high today but she has not yet taken her medicine.  Continue carvedilol, chlorthalidone, and spironolactone.     Current medicines are reviewed at length with the patient today.  The patient does not have concerns regarding medicines.  The following changes have been made:  None  Labs/ tests ordered today include:   Orders Placed This Encounter  Procedures   Lipid panel      Disposition:   FU with Avry Roedl C. Oval Linsey, MD, [facility] in 6 months.   osis, procedures, and orders on 08/23/2021 are all accurate and complete.   Signed, Nicandro Perrault C. Oval Linsey, MD, [facility]  08/23/2021 10:04 AM    [place]

## 2021-08-23 NOTE — Patient Instructions (Signed)
Medication Instructions:  Your physician recommends that you continue on your current medications as directed. Please refer to the Current Medication list given to you today.   *If you need a refill on your cardiac medications before your next appointment, please call your pharmacy*  Lab Work: FASTING LP END OF MAY FIRST OF June  If you have labs (blood work) drawn today and your tests are completely normal, you will receive your results only by: [place] (if you have MyChart) OR A paper copy in the mail If you have any lab test that is abnormal or we need to change your treatment, we will call you to review the results.  Testing/Procedures: NONE  Follow-Up: At [facility], you and your health needs are our priority.  As part of our continuing mission to provide you with exceptional heart care, we have created designated Provider Care Teams.  These Care Teams include your primary Cardiologist (physician) and Advanced Practice Providers (APPs -  Physician Assistants and Nurse Practitioners) who all work together to provide you with the care you need, when you need it.  We recommend signing up for the patient portal called "MyChart".  Sign up information is provided on this After Visit Summary.  MyChart is used to connect with patients for Virtual Visits (Telemedicine).  Patients are able to view lab/test results, encounter notes, upcoming appointments, etc.  Non-urgent messages can be sent to your provider as well.   To learn more about what you can do with MyChart, go to [email or web address].    Your next appointment:   12 month(s)  The format for your next appointment:   In Person  Provider:   Skeet Latch, MD{

## 2021-08-23 NOTE — Assessment & Plan Note (Signed)
She is doing a good job with her CPAP.  She was encouraged to keep using it.

## 2021-08-23 NOTE — Assessment & Plan Note (Signed)
Stable.  Blood pressure is well controlled and she continues to work on her lipids.

## 2021-08-23 NOTE — Assessment & Plan Note (Signed)
Blood pressure is now better controlled.  It is a little high today but she has not yet taken her medicine.  Continue carvedilol, chlorthalidone, and spironolactone.

## 2021-08-23 NOTE — Assessment & Plan Note (Signed)
She has not tolerated statins.  She would like to avoid any cholesterol medications and is doing a really wonderful job with diet and exercise.  Recheck lipids and a CMP in a few months.

## 2021-08-25 DIAGNOSIS — Z419 Encounter for procedure for purposes other than remedying health state, unspecified: Secondary | ICD-10-CM | POA: Diagnosis not present

## 2021-09-06 DIAGNOSIS — G4733 Obstructive sleep apnea (adult) (pediatric): Secondary | ICD-10-CM | POA: Diagnosis not present

## 2021-09-25 DIAGNOSIS — Z419 Encounter for procedure for purposes other than remedying health state, unspecified: Secondary | ICD-10-CM | POA: Diagnosis not present

## 2021-10-07 DIAGNOSIS — G4733 Obstructive sleep apnea (adult) (pediatric): Secondary | ICD-10-CM | POA: Diagnosis not present

## 2021-10-19 ENCOUNTER — Other Ambulatory Visit: Payer: Self-pay | Admitting: Family Medicine

## 2021-10-19 DIAGNOSIS — Z1231 Encounter for screening mammogram for malignant neoplasm of breast: Secondary | ICD-10-CM

## 2021-10-25 DIAGNOSIS — Z419 Encounter for procedure for purposes other than remedying health state, unspecified: Secondary | ICD-10-CM | POA: Diagnosis not present

## 2021-10-26 ENCOUNTER — Other Ambulatory Visit: Payer: Self-pay | Admitting: Family Medicine

## 2021-10-27 ENCOUNTER — Ambulatory Visit (INDEPENDENT_AMBULATORY_CARE_PROVIDER_SITE_OTHER): Payer: 59 | Admitting: Cardiovascular Disease

## 2021-10-27 DIAGNOSIS — R0683 Snoring: Secondary | ICD-10-CM

## 2021-10-27 DIAGNOSIS — I639 Cerebral infarction, unspecified: Secondary | ICD-10-CM

## 2021-10-27 DIAGNOSIS — G4733 Obstructive sleep apnea (adult) (pediatric): Secondary | ICD-10-CM | POA: Diagnosis not present

## 2021-10-27 DIAGNOSIS — I10 Essential (primary) hypertension: Secondary | ICD-10-CM

## 2021-10-27 DIAGNOSIS — E6609 Other obesity due to excess calories: Secondary | ICD-10-CM | POA: Diagnosis not present

## 2021-10-27 DIAGNOSIS — Z6834 Body mass index (BMI) 34.0-34.9, adult: Secondary | ICD-10-CM

## 2021-10-27 DIAGNOSIS — E7800 Pure hypercholesterolemia, unspecified: Secondary | ICD-10-CM | POA: Diagnosis not present

## 2021-10-27 DIAGNOSIS — R0681 Apnea, not elsewhere classified: Secondary | ICD-10-CM

## 2021-10-27 NOTE — Patient Instructions (Addendum)
Medication Instructions:  ?No changes  ? ?*If you need a refill on your cardiac medications before your next appointment, please call your pharmacy* ? ? ?Lab Work: ?Not needed ? ? ? ?Testing/Procedures: ?Not needed ? ? ?Follow-Up: ?At [facility], you and your health needs are our priority.  As part of our continuing mission to provide you with exceptional heart care, we have created designated Provider Care Teams.  These Care Teams include your primary Cardiologist (physician) and Advanced Practice Providers (APPs -  Physician Assistants and Nurse Practitioners) who all work together to provide you with the care you need, when you need it. ? ?  ? ?Your next appointment:   ?12 month(s) ? ?The format for your next appointment:   ?In Person ? ?Provider:   ?Dr Shelva Majestic  ? ? ?Other Instructions  ?No pressure changes were done to your C-PAP -- but we are SWITCHING MASK   to N30i ?

## 2021-10-27 NOTE — Progress Notes (Signed)
? ?Cardiology Office Note   ? ?Date:  11/03/2021  ? ?ID:  Shaka Zech, DOB 26-Apr-1961, MRN [id number] ? ?PCP:  Skeet Latch, MD  ?Cardiologist:  Shelva Majestic, MD (sleep); Dr. Oval Linsey ? ?New sleep evaluation ? ?History of Present Illness:  ?Teresa [name] is a 61 y.o. female who is followed by Dr. Oval Linsey and has a history of prior stroke in 2019, hypertension, palpitations, hypothyroidism, and documented moderate left ventricular hypertrophy.  She initially had a loop recorder inserted for her cryptogenic stroke and most recently this was removed.  Due to concerns for potential obstructive sleep apnea, she underwent a sleep study on June 14, 2021.  This revealed mild overall sleep apnea with an AHI of 8.9 and RDI of 16.3.  There was minimal oxygen desaturation to a nadir of 89%.  Efficiency was reduced and there was moderate snoring.  Subsequently, she received a new ResMed air sense 11 AutoSet unit on August 09, 2021 at the care is her DME company.  She has been using a nasal mask over the nose at times has noted some patient.  She has a part-time job and works from 5 to 9 PM.  She typically is in bed by 1030 and is up at 7 AM.  A download was obtained from April 3 through Oct 26, 2021.  She is meeting compliance standards with usage at 93% and usage days greater than 4 hours at 80%.  Sleep duration, however was suboptimal but met compliance criteria at 4 hours and 40 minutes.  Her CPAP is set at a range of 6 to 16 cm of water with a 95th percentile pressure at 8.4 and maximum average pressure 9.1.  AHI 0.5.  The last several days she did not use her CPAP because there was a hole in her tube which occurred from her nail.  Since initiating CPAP therapy, she is sleeping better.  She denies any residual daytime sleepiness.  An Epworth Sleepiness Scale: Score was calculated in the office today and this endorsed at 5 arguing against residual daytime sleepiness.  She continues to be on carvedilol 25 mg twice  a day, chlorthalidone 25 mg daily, hydralazine 100 mg 3 times a day, and spironolactone 50 mg daily for hypertension.  She is on levothyroxine 175 mcg daily for hypothyroidism.  She continues to be on aspirin.  She presents for initial evaluation. ? ? ?Past Medical History:  ?Diagnosis Date  ? Allergy   ? Anemia   ? Cervical disc disorder with radiculopathy of cervical region 02/10/2016  ? Chronic lower back pain   ? Claudication in peripheral vascular disease ([place]) 06/12/2020  ? Diverticulosis   ? Ear discomfort 03/11/2016  ? EKG, abnormal 01/06/2017  ? Gastritis   ? GERD (gastroesophageal reflux disease)   ? H/O hiatal hernia   ? Hiatal hernia   ? History of amputation of lesser toe of left foot ([place]) 03/11/2015  ? History of colon cancer   ? Hypertension   ? Hyperthyroidism   ? hx  ? Hypothyroidism   ? Lateral knee pain, left 08/06/2013  ? Left shoulder pain 05/28/2015  ? Migraine headache   ? "a few times/yr" (03/05/2015)  ? Neck pain 02/11/2014  ? Neck strain 02/06/2012  ? Numbness and tingling in right hand 05/16/2018  ? Osteomyelitis ([place])   ? [name] 03/05/2015  ? Osteomyelitis of toe of left foot ([place]) 03/05/2015  ? Palpitations 01/05/2016  ? Positive TB test   ? Snoring 02/15/2021  ?  Stroke [facility])   ? Throat pain 09/21/2011  ? Normal CT and US neck march 2013, repeat Neck US in 1 year to follow-up small nodules.  Head CT 2013: normal ? glosspharyngeal neuralgia.  On amitriptyline await [facility] ENt referral.   ? Tubular adenoma of colon   ? Tubular adenoma of colon   ? Ulnar neuropathy at elbow of right upper extremity 06/12/2018  ? ? ?Past Surgical History:  ?Procedure Laterality Date  ? ABDOMINAL HYSTERECTOMY  2006  ? AMPUTATION TOE Left 03/05/2015  ? Procedure: AMPUTATION LEFT 5TH  TOE;  Surgeon: Wylene Simmer, MD;  Location: [facility];  Service: Orthopedics;  Laterality: Left;  ? APPENDECTOMY  2006  ? BREAST BIOPSY Left 11/26/2014  ? [place]  ? COLONOSCOPY    ? GALLBLADDER SURGERY    ? LOOP RECORDER  INSERTION N/A 09/18/2017  ? Procedure: LOOP RECORDER INSERTION;  Surgeon: Evans Lance, MD;  Location: [facility];  Service: Cardiovascular;  Laterality: N/A;  ? OPEN REDUCTION INTERNAL FIXATION (ORIF) DISTAL RADIAL FRACTURE Right 09/14/2019  ? Procedure: OPEN REDUCTION INTERNAL FIXATION (ORIF)  RADIAL AND ULNA FRACTURE;  Surgeon: Iran Planas, MD;  Location: [facility];  Service: Orthopedics;  Laterality: Right;  ? ORIF ULNAR FRACTURE Right 09/14/2019  ? TEE WITHOUT CARDIOVERSION N/A 09/18/2017  ? Procedure: TRANSESOPHAGEAL ECHOCARDIOGRAM (TEE);  Surgeon: Pixie Casino, MD;  Location: [facility];  Service: Cardiovascular;  Laterality: N/A;  ? TOE AMPUTATION Left 03/05/2015  ? 5th toe  ? TUBAL LIGATION Bilateral 1990  ? UPPER GASTROINTESTINAL ENDOSCOPY    ? ? ?Current Medications: ?Outpatient Medications Prior to Visit  ?Medication Sig Dispense Refill  ? aspirin 81 MG chewable tablet Chew 1 tablet (81 mg total) by mouth daily. 30 tablet 0  ? carvedilol (COREG) 25 MG tablet TAKE 1 TABLET BY MOUTH TWICE DAILY WITH MEALS 180 tablet 1  ? chlorthalidone (HYGROTON) 25 MG tablet Take 1 tablet by mouth once daily 90 tablet 1  ? hydrALAZINE (APRESOLINE) 100 MG tablet TAKE 1 TABLET BY MOUTH THREE TIMES DAILY 270 tablet 0  ? levothyroxine (SYNTHROID) 175 MCG tablet Take 1 tablet (175 mcg total) by mouth daily before breakfast. 90 tablet 1  ? naproxen (NAPROSYN) 500 MG tablet Take 1 tablet (500 mg total) by mouth 2 (two) times daily as needed. 30 tablet 0  ? Probiotic Product (PROBIOTIC DAILY PO) Take 1 tablet by mouth.    ? RESTASIS 0.05 % ophthalmic emulsion Place 1 drop into both eyes 2 (two) times daily.    ? spironolactone (ALDACTONE) 50 MG tablet Take 1 tablet by mouth once daily 90 tablet 1  ? tiZANidine (ZANAFLEX) 2 MG tablet Take 1 tablet (2 mg total) by mouth every 12 (twelve) hours as needed for muscle spasms. 30 tablet 1  ? ?Facility-Administered Medications Prior to Visit  ?Medication Dose Route Frequency  Provider Last Rate Last Admin  ? olopatadine (PATANOL) 0.1 % ophthalmic solution 1 drop  1 drop Both Eyes BID McDiarmid, Blane Ohara, MD      ?  ? ?Allergies:   Amlodipine, Bactrim [sulfamethoxazole-trimethoprim], Clonidine derivatives, Lisinopril, Statins, Aspirin, and Latex  ? ?Social History  ? ?Socioeconomic History  ? Marital status: Single  ?  Spouse name: Not on file  ? Number of children: 3  ? Years of education: Not on file  ? Highest education level: Not on file  ?Occupational History  ? Occupation: cosmetolgist  ?Tobacco Use  ? Smoking status:  Former  ?  Packs/day: 1.00  ?  Years: 36.00  ?  Pack years: 36.00  ?  Types: Cigarettes  ?  Quit date: 09/23/2017  ?  Years since quitting: 4.1  ? Smokeless tobacco: Never  ? Tobacco comments:  ?  Tobacco info given 03/01/17  ?Vaping Use  ? Vaping Use: Former  ?Substance and Sexual Activity  ? Alcohol use: No  ? Drug use: No  ? Sexual activity: Not on file  ?Other Topics Concern  ? Not on file  ?Social History Narrative  ? Lives with two adult children.  Another daughter lives in [place].  Cosmetologist.  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  Likes to fish and play bingo.  Goes to church.  ? ?Social Determinants of Health  ? ?Financial Resource Strain: Not on file  ?Food Insecurity: Not on file  ?Transportation Needs: Not on file  ?Physical Activity: Not on file  ?Stress: Not on file  ?Social Connections: Not on file  ?  ? ?Family History:  The patient's family history includes Breast cancer in her maternal aunt and paternal aunt; Cancer (age of onset: 12) in her father; Diabetes in her brother, mother, sister, and another family member; Heart disease in her mother and another family member; Kidney disease in her brother and sister; Stroke in her father.  ? ?ROS ?General: Negative; No fevers, chills, or night sweats;  ?HEENT: Negative; No changes in vision or hearing, sinus congestion, difficulty swallowing ?Pulmonary: Negative; No cough, wheezing, shortness of breath,  hemoptysis ?Cardiovascular: Hypertension, ?GI: Negative; No nausea, vomiting, diarrhea, or abdominal pain ?GU: Negative; No dysuria, hematuria, or difficulty voiding ?Musculoskeletal: Negative; no myalgias, joint pain, or wea

## 2021-11-03 ENCOUNTER — Encounter: Payer: Self-pay | Admitting: Cardiovascular Disease

## 2021-11-09 ENCOUNTER — Encounter: Payer: Self-pay | Admitting: Family Medicine

## 2021-11-09 ENCOUNTER — Ambulatory Visit (INDEPENDENT_AMBULATORY_CARE_PROVIDER_SITE_OTHER): Payer: 59 | Admitting: Family Medicine

## 2021-11-09 VITALS — BP 116/75 | HR 67 | Ht 65.0 in | Wt 203.4 lb

## 2021-11-09 DIAGNOSIS — I5032 Chronic diastolic (congestive) heart failure: Secondary | ICD-10-CM | POA: Diagnosis not present

## 2021-11-09 DIAGNOSIS — Z1231 Encounter for screening mammogram for malignant neoplasm of breast: Secondary | ICD-10-CM

## 2021-11-09 DIAGNOSIS — I10 Essential (primary) hypertension: Secondary | ICD-10-CM | POA: Diagnosis not present

## 2021-11-09 DIAGNOSIS — I739 Peripheral vascular disease, unspecified: Secondary | ICD-10-CM | POA: Diagnosis not present

## 2021-11-09 DIAGNOSIS — Z89422 Acquired absence of other left toe(s): Secondary | ICD-10-CM

## 2021-11-09 MED ORDER — TIZANIDINE HCL 2 MG PO TABS
2.0000 mg | ORAL_TABLET | Freq: Two times a day (BID) | ORAL | 1 refills | Status: DC | PRN
Start: 2021-11-09 — End: 2022-07-08

## 2021-11-09 NOTE — Progress Notes (Addendum)
    SUBJECTIVE:   CHIEF COMPLAINT / HPI:   Breast cancer screening: The patient contacted the breast center to schedule her mammogram, and she informed them that she had chest muscle pain. Hence, she was advised to see PCP first should she need a diagnostic mammogram. She denies breast pain and no lump or discharge. She stated that she pulled her chest muscle while pushing a cart a week prior, and that is where the soreness came from. This has since improved.   HTN/CHF/PVD: She is compliant with her ASA 81 mg QD, Coreg 25 mg BID, Chlorthalidone 25 mg QD, and Aldactone 50 mg QD. She recently started an exercise program for weight loss which she enjoys a lot. She is Statins intolerant.  Toe amputation: No major concerns of her left 5th toe stump.  PERTINENT  PMH / PSH: PMHx reviewed  OBJECTIVE:   BP 116/75   Pulse 67   Ht '5\' 5"'$  (1.651 m)   Wt 203 lb 6.4 oz (92.3 kg)   SpO2 99%   BMI 33.85 kg/m   Physical Exam Vitals and nursing note reviewed. Exam conducted with a chaperone present Lavell Anchors).  Cardiovascular:     Rate and Rhythm: Normal rate and regular rhythm.     Heart sounds: Normal heart sounds. No murmur heard. Pulmonary:     Effort: Pulmonary effort is normal. No respiratory distress.     Breath sounds: Normal breath sounds. No wheezing.  Chest:     Chest [place]: No mass, deformity or tenderness.  Breasts:    Right: Normal. No swelling, inverted nipple, mass, nipple discharge, skin change or tenderness.     Left: Normal. No swelling, inverted nipple, mass, nipple discharge, skin change or tenderness.  Lymphadenopathy:     Upper Body:     Right upper body: No axillary adenopathy.     Left upper body: No axillary adenopathy.     ASSESSMENT/PLAN:   Hypertension Her BP is impressively good on her current regimen. No adjustment needed.  Chronic diastolic CHF (congestive heart failure) (HCC) Stable on Coreg. Note that her ECHO in 2017 shows GIIDD, however, her  recent ECHO shows normal EF with G1DD. I reviewed her most recent cardiology note from Dr. Gaetano [facility] - no recommended adjustment to her current regimen.  She is asymptomatic. Monitor closely on her current regimen.  Peripheral vascular disease (HCC) Stable on ASA.   Chest muscle pain: Breast exam benign. Mammogram screening recommended and was ordered. She will contact GI for an appointment.   Toe amputation: Left great toe. No concerns  [name], MD [place]

## 2021-11-09 NOTE — Assessment & Plan Note (Addendum)
Stable on Coreg. ?Note that her ECHO in 2017 shows GIIDD, however, her recent ECHO shows normal EF with G1DD. ?I reviewed her most recent cardiology note from Dr. Gaetano [place] - no recommended adjustment to her current regimen.  ?She is asymptomatic. ?Monitor closely on her current regimen. ?

## 2021-11-09 NOTE — Assessment & Plan Note (Signed)
Her BP is impressively good on her current regimen. ?No adjustment needed. ?

## 2021-11-09 NOTE — Assessment & Plan Note (Signed)
Stable on ASA. ?

## 2021-11-09 NOTE — Patient Instructions (Signed)
Mammogram A mammogram is an X-ray of the breasts. This is done to check for changes that are not normal. This test can look for changes that may be caused by breast cancer or other problems. Mammograms are regularly done on women beginning at age 60. A man may have a mammogram if he has a lump or swelling in his breast. Tell a doctor: About any allergies you have. If you have breast implants. If you have had breast disease, biopsy, or surgery. If you have a family history of breast cancer. If you are breastfeeding. Whether you are pregnant or may be pregnant. What are the risks? Generally, this is a safe procedure. But problems may occur, including: Being exposed to radiation. Radiation levels are very low with this test. The need for more tests. The results were not read properly. Trouble finding breast cancer in women with dense breasts. What happens before the test? Have this test done about 1-2 weeks after your menstrual period. This is often when your breasts are the least tender. If you are visiting a new doctor or clinic, have any past mammogram images sent to your new doctor's office. Wash your breasts and under your arms on the day of the test. Do not use deodorants, perfumes, lotions, or powders on the day of the test. Take off any jewelry from your neck. Wear clothes that you can change into and out of easily. What happens during the test?  You will take off your clothes from the waist up. You will put on a gown. You will stand in front of the X-ray machine. Each breast will be placed between two plastic or glass plates. The plates will press down on your breast for a few seconds. Try to relax. This does not cause any harm to your breasts. It may not feel comfortable, but it will be very brief. X-rays will be taken from different angles of each breast. The procedure may vary among doctors and hospitals. What can I expect after the test? The mammogram will be read by a  specialist (radiologist). You may need to do parts of the test again. This depends on the quality of the images. You may go back to your normal activities. It is up to you to get the results of your test. Ask how to get your results when they are ready. Summary A mammogram is an X-ray of the breasts. It looks for changes that may be caused by breast cancer or other problems. A man may have this test if he has a lump or swelling in his breast. Before the test, tell your doctor about any breast problems that you have had in the past. Have this test done about 1-2 weeks after your menstrual period. Ask when your test results will be ready. Make sure you get your test results. This information is not intended to replace advice given to you by your health care provider. Make sure you discuss any questions you have with your health care provider. Document Revised: 02/24/2021 Document Reviewed: 04/13/2020 Elsevier Patient Education  2023 Elsevier Inc.  

## 2021-11-12 ENCOUNTER — Other Ambulatory Visit: Payer: Self-pay | Admitting: Family Medicine

## 2021-11-12 ENCOUNTER — Encounter: Payer: Self-pay | Admitting: Family Medicine

## 2021-11-16 ENCOUNTER — Encounter: Payer: Self-pay | Admitting: Family Medicine

## 2021-11-24 NOTE — Assessment & Plan Note (Signed)
No concerns. 

## 2021-11-25 ENCOUNTER — Ambulatory Visit
Admission: RE | Admit: 2021-11-25 | Discharge: 2021-11-25 | Disposition: A | Discharge status: Home / Self Care | Payer: Medicaid Other | Source: Ambulatory Visit | Attending: Family Medicine | Admitting: Family Medicine

## 2021-11-25 ENCOUNTER — Ambulatory Visit: Payer: 59

## 2021-11-25 DIAGNOSIS — Z419 Encounter for procedure for purposes other than remedying health state, unspecified: Secondary | ICD-10-CM | POA: Diagnosis not present

## 2021-11-25 DIAGNOSIS — Z1231 Encounter for screening mammogram for malignant neoplasm of breast: Secondary | ICD-10-CM

## 2021-11-26 ENCOUNTER — Ambulatory Visit (INDEPENDENT_AMBULATORY_CARE_PROVIDER_SITE_OTHER): Payer: 59 | Admitting: Family Medicine

## 2021-11-26 ENCOUNTER — Encounter: Payer: Self-pay | Admitting: Family Medicine

## 2021-11-26 VITALS — BP 134/83 | HR 62 | Ht 65.0 in | Wt 207.4 lb

## 2021-11-26 DIAGNOSIS — H814 Vertigo of central origin: Secondary | ICD-10-CM

## 2021-11-26 DIAGNOSIS — R42 Dizziness and giddiness: Secondary | ICD-10-CM | POA: Diagnosis not present

## 2021-11-26 DIAGNOSIS — H5319 Other subjective visual disturbances: Secondary | ICD-10-CM

## 2021-11-26 DIAGNOSIS — Z8673 Personal history of transient ischemic attack (TIA), and cerebral infarction without residual deficits: Secondary | ICD-10-CM

## 2021-11-26 DIAGNOSIS — H539 Unspecified visual disturbance: Secondary | ICD-10-CM

## 2021-11-26 DIAGNOSIS — I10 Essential (primary) hypertension: Secondary | ICD-10-CM

## 2021-11-26 DIAGNOSIS — R519 Headache, unspecified: Secondary | ICD-10-CM | POA: Diagnosis not present

## 2021-11-26 DIAGNOSIS — H0520 Unspecified exophthalmos: Secondary | ICD-10-CM

## 2021-11-26 LAB — POCT HEMOGLOBIN: Hemoglobin: 11.2 g/dL (ref 11–14.6)

## 2021-11-26 MED ORDER — TRIAZOLAM 0.25 MG PO TABS: ORAL_TABLET | ORAL | 0 refills | Status: DC

## 2021-11-26 NOTE — Progress Notes (Signed)
    SUBJECTIVE:   CHIEF COMPLAINT / HPI:   Dizziness/Headache/Hx of CVA: She c/o dizziness, headache, and pressure at the back of her neck, ongoing for about 1-2 months.   She had a similar presentation about a year ago, and it seems similar to her presentation when she had a stroke in 2019. She denies arm weakness but has chronic should, neck, and arm pain. Her dizziness occurs randomly with no known triggers. Sometimes, she might be sitting and suddenly feels like she will fall off the chair or while walking. Her last episode was yesterday. It makes her feel like she is going to have an anxiety attack. Each time she feels dizzy, there is associated headache associated with light flashes in her eyes. Her last eye exam was about a year ago. No falls. Sometimes her gait feels wobbly. No recent falls.    HTN: She is compliant with all her meds, she is here for follow-up  PERTINENT  PMH / PSH: PMHx reviewed  OBJECTIVE:   Vitals:   11/26/21 0845 11/26/21 0905  BP: (!) 149/76 134/83  Pulse: 62 62  SpO2: 100%   Weight: 207 lb 6.4 oz (94.1 kg)   Height: '5\' 5"'$  (1.651 m)     Physical Exam Vitals and nursing note reviewed.  Cardiovascular:     Rate and Rhythm: Normal rate and regular rhythm.     Heart sounds: Normal heart sounds. No murmur heard. Pulmonary:     Effort: Pulmonary effort is normal. No respiratory distress.     Breath sounds: Normal breath sounds. No wheezing.  Musculoskeletal:     Right lower leg: No edema.     Left lower leg: No edema.  Neurological:     Mental Status: She is oriented to person, place, and time.     Cranial Nerves: No cranial nerve deficit.     Sensory: No sensory deficit.     Motor: No weakness.     Coordination: Coordination normal.     Gait: Gait normal.     Deep Tendon Reflexes: Reflexes normal.     ASSESSMENT/PLAN:   History of CVA (cerebrovascular accident) She is now having a similar presentation to her previous CVA. I discussed neuro  imaging to further evaluate her symptoms. CT head ordered. CT precautions discussed. She agreed with th plan.   Vitreous flashes of both eyes This is associated with her dizziness and headache. ?? Migraine headache vs retinal detachment. No sudden vision change. I discussed f/u with her ophthalmologist. She sent a Mychart message after this visit that she needs referral to a different ophthalmologist. Referral placed.   Hypertension BP initially elevated. However improved on repeat. No medication adjustment at this time. Monitor closely on current regimen.      [name], MD [place]

## 2021-11-26 NOTE — Patient Instructions (Signed)
Shoulder Exercises Ask your health care provider which exercises are safe for you. Do exercises exactly as told by your health care provider and adjust them as directed. It is normal to feel mild stretching, pulling, tightness, or discomfort as you do these exercises. Stop right away if you feel sudden pain or your pain gets worse. Do not begin these exercises until told by your health care provider. Stretching exercises External rotation and abduction This exercise is sometimes called corner stretch. This exercise rotates your arm outward (external rotation) and moves your arm out from your body (abduction). Stand in a doorway with one of your feet slightly in front of the other. This is called a staggered stance. If you cannot reach your forearms to the door frame, stand facing a corner of a room. Choose one of the following positions as told by your health care provider: Place your hands and forearms on the door frame above your head. Place your hands and forearms on the door frame at the height of your head. Place your hands on the door frame at the height of your elbows. Slowly move your weight onto your front foot until you feel a stretch across your chest and in the front of your shoulders. Keep your head and chest upright and keep your abdominal muscles tight. Hold for __________ seconds. To release the stretch, shift your weight to your back foot. Repeat __________ times. Complete this exercise __________ times a day. Extension, standing Stand and hold a broomstick, a cane, or a similar object behind your back. Your hands should be a little wider than shoulder width apart. Your palms should face away from your back. Keeping your elbows straight and your shoulder muscles relaxed, move the stick away from your body until you feel a stretch in your shoulders (extension). Avoid shrugging your shoulders while you move the stick. Keep your shoulder blades tucked down toward the middle of your  back. Hold for __________ seconds. Slowly return to the starting position. Repeat __________ times. Complete this exercise __________ times a day. Range-of-motion exercises Pendulum  Stand near a [place] or a surface that you can hold onto for balance. Bend at the waist and let your left / right arm hang straight down. Use your other arm to support you. Keep your back straight and do not lock your knees. Relax your left / right arm and shoulder muscles, and move your hips and your trunk so your left / right arm swings freely. Your arm should swing because of the motion of your body, not because you are using your arm or shoulder muscles. Keep moving your hips and trunk so your arm swings in the following directions, as told by your health care provider: Side to side. Forward and backward. In clockwise and counterclockwise circles. Continue each motion for __________ seconds, or for as long as told by your health care provider. Slowly return to the starting position. Repeat __________ times. Complete this exercise __________ times a day. Shoulder flexion, standing  Stand and hold a broomstick, a cane, or a similar object. Place your hands a little more than shoulder width apart on the object. Your left / right hand should be palm up, and your other hand should be palm down. Keep your elbow straight and your shoulder muscles relaxed. Push the stick up with your healthy arm to raise your left / right arm in front of your body, and then over your head until you feel a stretch in your shoulder (flexion). Avoid   shrugging your shoulder while you raise your arm. Keep your shoulder blade tucked down toward the middle of your back. Hold for __________ seconds. Slowly return to the starting position. Repeat __________ times. Complete this exercise __________ times a day. Shoulder abduction, standing Stand and hold a broomstick, a cane, or a similar object. Place your hands a little more than shoulder  width apart on the object. Your left / right hand should be palm up, and your other hand should be palm down. Keep your elbow straight and your shoulder muscles relaxed. Push the object across your body toward your left / right side. Raise your left / right arm to the side of your body (abduction) until you feel a stretch in your shoulder. Do not raise your arm above shoulder height unless your health care provider tells you to do that. If directed, raise your arm over your head. Avoid shrugging your shoulder while you raise your arm. Keep your shoulder blade tucked down toward the middle of your back. Hold for __________ seconds. Slowly return to the starting position. Repeat __________ times. Complete this exercise __________ times a day. Internal rotation  Place your left / right hand behind your back, palm up. Use your other hand to dangle an exercise band, a towel, or a similar object over your shoulder. Grasp the band with your left / right hand so you are holding on to both ends. Gently pull up on the band until you feel a stretch in the front of your left / right shoulder. The movement of your arm toward the center of your body is called internal rotation. Avoid shrugging your shoulder while you raise your arm. Keep your shoulder blade tucked down toward the middle of your back. Hold for __________ seconds. Release the stretch by letting go of the band and lowering your hands. Repeat __________ times. Complete this exercise __________ times a day. Strengthening exercises External rotation  Sit in a stable chair without armrests. Secure an exercise band to a stable object at elbow height on your left / right side. Place a soft object, such as a folded towel or a small pillow, between your left / right upper arm and your body to move your elbow about 4 inches (10 cm) away from your side. Hold the end of the exercise band so it is tight and there is no slack. Keeping your elbow pressed  against the soft object, slowly move your forearm out, away from your abdomen (external rotation). Keep your body steady so only your forearm moves. Hold for __________ seconds. Slowly return to the starting position. Repeat __________ times. Complete this exercise __________ times a day. Shoulder abduction  Sit in a stable chair without armrests, or stand up. Hold a __________ weight in your left / right hand, or hold an exercise band with both hands. Start with your arms straight down and your left / right palm facing in, toward your body. Slowly lift your left / right hand out to your side (abduction). Do not lift your hand above shoulder height unless your health care provider tells you that this is safe. Keep your arms straight. Avoid shrugging your shoulder while you do this movement. Keep your shoulder blade tucked down toward the middle of your back. Hold for __________ seconds. Slowly lower your arm, and return to the starting position. Repeat __________ times. Complete this exercise __________ times a day. Shoulder extension Sit in a stable chair without armrests, or stand up. Secure an exercise band   to a stable object in front of you so it is at shoulder height. Hold one end of the exercise band in each hand. Your palms should face each other. Straighten your elbows and lift your hands up to shoulder height. Step back, away from the secured end of the exercise band, until the band is tight and there is no slack. Squeeze your shoulder blades together as you pull your hands down to the sides of your thighs (extension). Stop when your hands are straight down by your sides. Do not let your hands go behind your body. Hold for __________ seconds. Slowly return to the starting position. Repeat __________ times. Complete this exercise __________ times a day. Shoulder row Sit in a stable chair without armrests, or stand up. Secure an exercise band to a stable object in front of you so it  is at waist height. Hold one end of the exercise band in each hand. Position your palms so that your thumbs are facing the ceiling (neutral position). Bend each of your elbows to a 90-degree angle (right angle) and keep your upper arms at your sides. Step back until the band is tight and there is no slack. Slowly pull your elbows back behind you. Hold for __________ seconds. Slowly return to the starting position. Repeat __________ times. Complete this exercise __________ times a day. Shoulder press-ups  Sit in a stable chair that has armrests. Sit upright, with your feet flat on the floor. Put your hands on the armrests so your elbows are bent and your fingers are pointing forward. Your hands should be about even with the sides of your body. Push down on the armrests and use your arms to lift yourself off the chair. Straighten your elbows and lift yourself up as much as you comfortably can. Move your shoulder blades down, and avoid letting your shoulders move up toward your ears. Keep your feet on the ground. As you get stronger, your feet should support less of your body weight as you lift yourself up. Hold for __________ seconds. Slowly lower yourself back into the chair. Repeat __________ times. Complete this exercise __________ times a day. [place] push-ups  Stand so you are facing a stable [place]. Your feet should be about one arm-length away from the [place]. Lean forward and place your palms on the [place] at shoulder height. Keep your feet flat on the floor as you bend your elbows and lean forward toward the [place]. Hold for __________ seconds. Straighten your elbows to push yourself back to the starting position. Repeat __________ times. Complete this exercise __________ times a day. This information is not intended to replace advice given to you by your health care provider. Make sure you discuss any questions you have with your health care provider. Document Revised: 06/08/2021 Document  Reviewed: 07/13/2018 Elsevier Patient Education  2023 Elsevier Inc.  

## 2021-11-26 NOTE — Assessment & Plan Note (Signed)
This is associated with her dizziness and headache. ?? Migraine headache vs retinal detachment. No sudden vision change. I discussed f/u with her ophthalmologist. She sent a Mychart message after this visit that she needs referral to a different ophthalmologist. Referral placed.

## 2021-11-26 NOTE — Assessment & Plan Note (Signed)
BP initially elevated. However improved on repeat. No medication adjustment at this time. Monitor closely on current regimen.

## 2021-11-26 NOTE — Assessment & Plan Note (Addendum)
She is now having a similar presentation to her previous CVA. I discussed neuro imaging to further evaluate her symptoms. CT head ordered. CT precautions discussed. She agreed with th plan.

## 2021-11-30 ENCOUNTER — Encounter: Payer: Self-pay | Admitting: *Deleted

## 2021-12-07 ENCOUNTER — Ambulatory Visit (HOSPITAL_COMMUNITY)
Admission: RE | Admit: 2021-12-07 | Discharge: 2021-12-07 | Disposition: A | Discharge status: Home / Self Care | Payer: 59 | Source: Ambulatory Visit | Attending: Family Medicine | Admitting: Family Medicine

## 2021-12-07 DIAGNOSIS — R42 Dizziness and giddiness: Secondary | ICD-10-CM | POA: Insufficient documentation

## 2021-12-07 DIAGNOSIS — R519 Headache, unspecified: Secondary | ICD-10-CM | POA: Diagnosis not present

## 2021-12-07 DIAGNOSIS — Z8673 Personal history of transient ischemic attack (TIA), and cerebral infarction without residual deficits: Secondary | ICD-10-CM | POA: Diagnosis not present

## 2021-12-07 MED ORDER — IOHEXOL 300 MG/ML  SOLN
100.0000 mL | Freq: Once | INTRAMUSCULAR | Status: AC | PRN
  Administered 2021-12-07: 100 mL via INTRAVENOUS

## 2021-12-08 ENCOUNTER — Telehealth: Payer: Self-pay | Admitting: Family Medicine

## 2021-12-08 NOTE — Telephone Encounter (Signed)
CT head result discussed. F/U soon if symptoms persists. She agreed with the plan.   CT HEAD W & WO CONTRAST (5MM)  Result Date: 12/08/2021 CLINICAL DATA:  Dizziness. Non intractable headache. History of stroke. EXAM: CT HEAD WITHOUT AND WITH CONTRAST TECHNIQUE: Contiguous axial images were obtained from the base of the skull through the vertex without and with intravenous contrast. RADIATION DOSE REDUCTION: This exam was performed according to the departmental dose-optimization program which includes automated exposure control, adjustment of the mA and/or kV according to patient size and/or use of iterative reconstruction technique. CONTRAST:  19m OMNIPAQUE IOHEXOL 300 MG/ML  SOLN COMPARISON:  09/13/2019 FINDINGS: Brain: No evidence of accelerated atrophy. Mild chronic small-vessel ischemic change of the hemispheric white matter. No sign of acute infarction, mass lesion, hemorrhage, hydrocephalus or extra-axial collection. Vascular: No abnormal vascular finding. Skull: Normal Sinuses/Orbits: Clear/normal Other: None IMPRESSION: No acute CT finding. Chronic small-vessel ischemic changes of the cerebral hemispheric white matter as seen previously. No specific cause of dizziness or headache is identified. Electronically Signed   By: MNelson ChimesM.D.   On: 12/08/2021 08:59

## 2021-12-25 DIAGNOSIS — Z419 Encounter for procedure for purposes other than remedying health state, unspecified: Secondary | ICD-10-CM | POA: Diagnosis not present

## 2021-12-31 ENCOUNTER — Encounter: Payer: Self-pay | Admitting: Family Medicine

## 2022-01-12 ENCOUNTER — Other Ambulatory Visit: Payer: Self-pay | Admitting: Family Medicine

## 2022-01-17 ENCOUNTER — Encounter: Payer: Self-pay | Admitting: Internal Medicine

## 2022-02-03 ENCOUNTER — Other Ambulatory Visit: Payer: Self-pay | Admitting: Cardiovascular Disease

## 2022-02-04 NOTE — Telephone Encounter (Signed)
Rx(s) sent to pharmacy electronically.  

## 2022-02-22 ENCOUNTER — Ambulatory Visit
Admission: EM | Admit: 2022-02-22 | Discharge: 2022-02-22 | Disposition: A | Discharge status: Home / Self Care | Payer: 59 | Attending: Urgent Care | Admitting: Urgent Care

## 2022-02-22 DIAGNOSIS — L299 Pruritus, unspecified: Secondary | ICD-10-CM | POA: Diagnosis not present

## 2022-02-22 DIAGNOSIS — L249 Irritant contact dermatitis, unspecified cause: Secondary | ICD-10-CM | POA: Diagnosis not present

## 2022-02-22 MED ORDER — HYDROXYZINE HCL 25 MG PO TABS: 12.5000 mg | ORAL_TABLET | Freq: Three times a day (TID) | ORAL | 0 refills | Status: DC | PRN

## 2022-02-22 MED ORDER — TRIAMCINOLONE ACETONIDE 0.1 % EX CREA
1.0000 | TOPICAL_CREAM | Freq: Two times a day (BID) | CUTANEOUS | 0 refills | Status: DC
Start: 2022-02-22 — End: 2022-04-05

## 2022-02-22 NOTE — ED Triage Notes (Signed)
Pt. C/o lump on her right inner thigh that has spread and has continuously itch w/ some discoloration.

## 2022-02-22 NOTE — ED Provider Notes (Signed)
[place]   MRN: [id number] DOB: April 23, 1961  Subjective:   Teresa [name] is a 61 y.o. female presenting for 2-day history of acute onset persistent right inner thigh itching, discoloration.  Patient suspects she was bitten by an insect.  She takes care of her granddaughter who regularly eats on common areas.  Patient notices that there are problems everywhere especially in her bed and feels like something bit her when she was sleeping.  No tenderness, drainage of pus or bleeding.  There is a slight warm sensation per patient but feels like this is improving.   Current Facility-Administered Medications:    olopatadine (PATANOL) 0.1 % ophthalmic solution 1 drop, 1 drop, Both Eyes, BID, McDiarmid, Blane Ohara, MD  Current Outpatient Medications:    aspirin 81 MG chewable tablet, Chew 1 tablet (81 mg total) by mouth daily., Disp: 30 tablet, Rfl: 0   carvedilol (COREG) 25 MG tablet, TAKE 1 TABLET BY MOUTH TWICE DAILY WITH MEALS, Disp: 180 tablet, Rfl: 1   chlorthalidone (HYGROTON) 25 MG tablet, Take 1 tablet by mouth once daily, Disp: 90 tablet, Rfl: 1   hydrALAZINE (APRESOLINE) 100 MG tablet, TAKE 1 TABLET BY MOUTH THREE TIMES DAILY, Disp: 270 tablet, Rfl: 0   levothyroxine (SYNTHROID) 175 MCG tablet, TAKE 1 TABLET BY MOUTH IN THE MORNING 30 MINUTES BEFORE FOOD., Disp: 90 tablet, Rfl: 0   naproxen (NAPROSYN) 500 MG tablet, Take 1 tablet (500 mg total) by mouth 2 (two) times daily as needed. (Patient not taking: Reported on 11/09/2021), Disp: 30 tablet, Rfl: 0   RESTASIS 0.05 % ophthalmic emulsion, Place 1 drop into both eyes 2 (two) times daily., Disp: , Rfl:    spironolactone (ALDACTONE) 50 MG tablet, Take 1 tablet by mouth once daily, Disp: 90 tablet, Rfl: 1   tiZANidine (ZANAFLEX) 2 MG tablet, Take 1 tablet (2 mg total) by mouth every 12 (twelve) hours as needed for muscle spasms. (Patient not taking: Reported on 11/26/2021), Disp: 30 tablet, Rfl: 1   triazolam (HALCION)  0.25 MG tablet, Take 1 tablet 15 to 30 minutes before your CT scan, Disp: 1 tablet, Rfl: 0   Allergies  Allergen Reactions   Amlodipine Swelling    Leg swelling   Bactrim [Sulfamethoxazole-Trimethoprim] Anaphylaxis   Clonidine Derivatives Swelling and Other (See Comments)    Facial swelling   Lisinopril Swelling    Lip swelling   Statins Swelling    Body and muscle aches and face/lip swelling on Statins (Lipitor, Pravachol)   Aspirin Nausea Only    Doses higher than '81mg'$  only   Latex Rash    NO POWDERED GLOVES!!    Past Medical History:  Diagnosis Date   Allergy    Anemia    Cervical disc disorder with radiculopathy of cervical region 02/10/2016   Chronic lower back pain    Claudication in peripheral vascular disease ([place]) 06/12/2020   Diverticulosis    Ear discomfort 03/11/2016   EKG, abnormal 01/06/2017   Gastritis    GERD (gastroesophageal reflux disease)    H/O hiatal hernia    Hiatal hernia    History of amputation of lesser toe of left foot ([place]) 03/11/2015   History of colon cancer    Hypertension    Hyperthyroidism    hx   Hypothyroidism    Lateral knee pain, left 08/06/2013   Left shoulder pain 05/28/2015   Migraine headache    "a few times/yr" (03/05/2015)   Neck pain 02/11/2014   Neck  strain 02/06/2012   Numbness and tingling in right hand 05/16/2018   Osteomyelitis ([place])    [name] 03/05/2015   Osteomyelitis of toe of left foot ([place]) 03/05/2015   Palpitations 01/05/2016   Positive TB test    Snoring 02/15/2021   Stroke [facility])    Throat pain 09/21/2011   Normal CT and US neck march 2013, repeat Neck US in 1 year to follow-up small nodules.  Head CT 2013: normal ? glosspharyngeal neuralgia.  On amitriptyline await [facility] EN[facility] referral.    Tubular adenoma of colon    Tubular adenoma of colon    Ulnar neuropathy at elbow of right upper extremity 06/12/2018     Past Surgical History:  Procedure Laterality Date   ABDOMINAL HYSTERECTOMY  2006   AMPUTATION TOE Left  03/05/2015   Procedure: AMPUTATION LEFT 5TH  TOE;  Surgeon: Wylene Simmer, MD;  Location: [facility];  Service: Orthopedics;  Laterality: Left;   APPENDECTOMY  2006   BREAST BIOPSY Left 11/26/2014   CESAREAN SECTION  1979 1986 1990   COLONOSCOPY     GALLBLADDER SURGERY     LOOP RECORDER INSERTION N/A 09/18/2017   Procedure: LOOP RECORDER INSERTION;  Surgeon: Evans Lance, MD;  Location: [facility];  Service: Cardiovascular;  Laterality: N/A;   OPEN REDUCTION INTERNAL FIXATION (ORIF) DISTAL RADIAL FRACTURE Right 09/14/2019   Procedure: OPEN REDUCTION INTERNAL FIXATION (ORIF)  RADIAL AND ULNA FRACTURE;  Surgeon: Iran Planas, MD;  Location: [facility];  Service: Orthopedics;  Laterality: Right;   ORIF ULNAR FRACTURE Right 09/14/2019   TEE WITHOUT CARDIOVERSION N/A 09/18/2017   Procedure: TRANSESOPHAGEAL ECHOCARDIOGRAM (TEE);  Surgeon: Pixie Casino, MD;  Location: [facility];  Service: Cardiovascular;  Laterality: N/A;   TOE AMPUTATION Left 03/05/2015   5th toe   TUBAL LIGATION Bilateral 1990   UPPER GASTROINTESTINAL ENDOSCOPY      Family History  Problem Relation Age of Onset   Heart disease Mother        CAB age 105   Diabetes Mother    Cancer Father 20       pancreatic cancer   Stroke Father    Diabetes Sister    Kidney disease Sister        renal failure   Diabetes Brother    Kidney disease Brother        renal failure   Heart disease Other    Diabetes Other    Breast cancer Maternal Aunt        per pt aunts and uncles died of cancer not sure the type   Breast cancer Paternal Aunt    Esophageal cancer Neg Hx    Rectal cancer Neg Hx    Stomach cancer Neg Hx    Thyroid disease Neg Hx     Social History   Tobacco Use   Smoking status: Former    Packs/day: 1.00    Years: 36.00    Total pack years: 36.00    Types: Cigarettes    Quit date: 09/23/2017    Years since quitting: 4.4   Smokeless tobacco: Never   Tobacco comments:    Tobacco info given 03/01/17  Vaping Use    Vaping Use: Former  Substance Use Topics   Alcohol use: No   Drug use: No    ROS   Objective:   Vitals: BP 137/85   Pulse 68   Temp 98.8 F (37.1 C) (Oral)   Resp 16   SpO2 96%  Physical Exam Constitutional:      General: She is not in acute distress.    Appearance: Normal appearance. She is well-developed. She is not ill-appearing, toxic-appearing or diaphoretic.  HENT:     Head: Normocephalic and atraumatic.     Nose: Nose normal.     Mouth/Throat:     Mouth: Mucous membranes are moist.  Eyes:     General: No scleral icterus.       Right eye: No discharge.        Left eye: No discharge.     Extraocular Movements: Extraocular movements intact.  Cardiovascular:     Rate and Rhythm: Normal rate.  Pulmonary:     Effort: Pulmonary effort is normal.  Skin:    General: Skin is warm and dry.       Neurological:     General: No focal deficit present.     Mental Status: She is alert and oriented to person, place, and time.  Psychiatric:        Mood and Affect: Mood normal.        Behavior: Behavior normal.     Assessment and Plan :   PDMP not reviewed this encounter.  1. Irritant contact dermatitis, unspecified trigger   2. Itching    Suspect patient has an irritant dermatitis from an unknown insect bite.  Recommended topical therapy with triamcinolone.  Oral hydroxyzine for itching.  Low suspicion for infected wound. Counseled patient on potential for adverse effects with medications prescribed/recommended today, ER and return-to-clinic precautions discussed, patient verbalized understanding.    Jaynee Eagles, [place] 02/22/22 [phone]

## 2022-02-23 ENCOUNTER — Other Ambulatory Visit: Payer: Self-pay | Admitting: Family Medicine

## 2022-03-02 ENCOUNTER — Other Ambulatory Visit: Payer: Self-pay | Admitting: Family Medicine

## 2022-03-16 ENCOUNTER — Encounter (HOSPITAL_BASED_OUTPATIENT_CLINIC_OR_DEPARTMENT_OTHER): Payer: Self-pay | Admitting: Cardiovascular Disease

## 2022-03-16 ENCOUNTER — Encounter: Payer: Self-pay | Admitting: Cardiovascular Disease

## 2022-03-16 NOTE — Telephone Encounter (Signed)
Please advise 

## 2022-03-17 ENCOUNTER — Telehealth: Payer: Self-pay | Admitting: *Deleted

## 2022-03-17 NOTE — Telephone Encounter (Signed)
Spoke with patient concerning her CPAP machine. She informed me that [facility]'s main office contacted her saying she needs to return the machine due to her insurance change. She had Friday insurance, which went out of business. Per the patient she now has [employer] and [employer] secondary. Unfortunately [facility] does not participate with [employer]. I spoke with Skip Estimable, RT to ask what does she need to do in order not to have a lapse in therapy. He states that he can do a override so that she can keep the machine until she gets set up by a new company. Jearld Fenton states that he will contact the patient. Patient will be switched over to another company that takes her insurance.

## 2022-03-18 ENCOUNTER — Telehealth: Payer: Self-pay | Admitting: *Deleted

## 2022-03-18 NOTE — Telephone Encounter (Signed)
Patient CPAP transfer sent to [facility] via [facility] portal.

## 2022-03-24 DIAGNOSIS — I10 Essential (primary) hypertension: Secondary | ICD-10-CM | POA: Diagnosis not present

## 2022-03-24 DIAGNOSIS — G4733 Obstructive sleep apnea (adult) (pediatric): Secondary | ICD-10-CM | POA: Diagnosis not present

## 2022-03-30 DIAGNOSIS — G4733 Obstructive sleep apnea (adult) (pediatric): Secondary | ICD-10-CM | POA: Diagnosis not present

## 2022-04-05 ENCOUNTER — Encounter: Payer: Self-pay | Admitting: Family Medicine

## 2022-04-05 ENCOUNTER — Ambulatory Visit (HOSPITAL_COMMUNITY)
Admission: RE | Admit: 2022-04-05 | Discharge: 2022-04-05 | Disposition: A | Discharge status: Home / Self Care | Payer: Medicare Other | Source: Ambulatory Visit | Attending: Family Medicine | Admitting: Family Medicine

## 2022-04-05 ENCOUNTER — Ambulatory Visit (INDEPENDENT_AMBULATORY_CARE_PROVIDER_SITE_OTHER): Payer: Medicare Other | Admitting: Family Medicine

## 2022-04-05 ENCOUNTER — Telehealth: Payer: Self-pay | Admitting: Family Medicine

## 2022-04-05 VITALS — BP 125/74 | HR 67 | Ht 65.0 in | Wt 205.6 lb

## 2022-04-05 DIAGNOSIS — I10 Essential (primary) hypertension: Secondary | ICD-10-CM | POA: Diagnosis not present

## 2022-04-05 DIAGNOSIS — Z23 Encounter for immunization: Secondary | ICD-10-CM | POA: Diagnosis not present

## 2022-04-05 DIAGNOSIS — I1A0 Resistant hypertension: Secondary | ICD-10-CM

## 2022-04-05 DIAGNOSIS — M25561 Pain in right knee: Secondary | ICD-10-CM | POA: Insufficient documentation

## 2022-04-05 DIAGNOSIS — R2241 Localized swelling, mass and lump, right lower limb: Secondary | ICD-10-CM | POA: Diagnosis not present

## 2022-04-05 DIAGNOSIS — Z1211 Encounter for screening for malignant neoplasm of colon: Secondary | ICD-10-CM

## 2022-04-05 DIAGNOSIS — E039 Hypothyroidism, unspecified: Secondary | ICD-10-CM | POA: Diagnosis not present

## 2022-04-05 NOTE — Assessment & Plan Note (Signed)
BP looks great Bmet and FLP checked. I will contact her soon with her test results.

## 2022-04-05 NOTE — Telephone Encounter (Signed)
Xray result discussed with her. Unusual to her tibial tubercle apophysis  at this age. Could be due to stress and repetitive movement. Management discussed - conservative (pain control), PT, or surgery in the future. We will manage conservatively for now.  She was appreciative of the call.    DG Knee Complete 4 Views Right  Result Date: 04/05/2022 CLINICAL DATA:  Patient reports a "right knee bump", midline and just distal to the patella EXAM: RIGHT KNEE - COMPLETE 4 VIEW COMPARISON:  None Available. FINDINGS: No evidence of fracture, dislocation, or joint effusion. Prominent tibial tubercle apophysis with adjacent infrapatellar tendon edema and mild soft tissue swelling. No evidence of arthropathy. IMPRESSION: Prominent tibial tubercle apophysis with adjacent edema, which can be seen in the setting of apophysitis. Electronically Signed   By: Beryle Flock M.D.   On: 04/05/2022 14:13

## 2022-04-05 NOTE — Progress Notes (Signed)
    SUBJECTIVE:   CHIEF COMPLAINT / HPI:   Knee Pain  Incident onset: She woke up 3 weeks ago with a painful bump on her right knee. There was no injury mechanism. Pain location: Pain has since resolved, but the bump persists. Nothing aggravates the symptoms. She has tried nothing for the symptoms.  She feels the bump has not changed in size but seems mobile.  HTN/Hypothyroidism: Here for follow up. No concerns.   HM: Due for colonoscopy and vaccine update.  PERTINENT  PMH / PSH: PMHx reviewed  OBJECTIVE:   Vitals:   04/05/22 0855  BP: 125/74  Pulse: 67  SpO2: 100%  Weight: 205 lb 9.6 oz (93.3 kg)  Height: '5\' 5"'$  (1.651 m)    Physical Exam Vitals and nursing note reviewed.  Cardiovascular:     Rate and Rhythm: Normal rate and regular rhythm.     Heart sounds: Normal heart sounds. No murmur heard. Pulmonary:     Effort: Pulmonary effort is normal.     Breath sounds: Normal breath sounds. No wheezing.  Abdominal:     General: Abdomen is flat. Bowel sounds are normal. There is no distension.     Tenderness: There is no abdominal tenderness.  Musculoskeletal:     Comments: firm, nodular bump a few inches below her right knee joint which is non-tender and non-erythematous      ASSESSMENT/PLAN:  Knee pain: Likely with bone spur. Xray ordered. I will contact  her soon with her test results.  Resistant hypertension BP looks great Bmet and FLP checked. I will contact her soon with her test results.   Hypothyroidism TSH checked.    HM: Colon cancer screening discussed. Referral placed. She declined COVID or shingrix. Flu shot given today. [name], MD [place]

## 2022-04-05 NOTE — Patient Instructions (Signed)
Colorectal Cancer Screening  Colorectal cancer screening is a group of tests that are used to check for colorectal cancer before symptoms develop. Colorectal refers to the colon and rectum. The colon and rectum are located at the end of the digestive tract and carry stool (feces) out of the body. Who should have screening? All adults who are 22-60 years old should have screening. Your health care provider may recommend screening before age 60. You will have tests every 1-10 years, depending on your results and the type of screening test. Screening recommendations for adults who are 60-7 years old vary depending on a person's health. People older than age 60 should no longer get colorectal cancer screening. You may have screening tests starting before age 60, or more often than other people, if you have any of these risk factors: A personal or family history of colorectal cancer or abnormal growths known as polyps in your colon. Inflammatory bowel disease, such as ulcerative colitis or Crohn's disease. A history of having radiation treatment to the abdomen or the area between the hip bones (pelvic area) for cancer. A type of genetic syndrome that is passed from parent to child (hereditary), such as: Lynch syndrome. Familial adenomatous polyposis. Turcot syndrome. Peutz-Jeghers syndrome. MUTYH-associated polyposis (MAP). A personal history of diabetes. Types of tests There are several types of colorectal screening tests. You may have one or more of the following: Guaiac-based fecal occult blood testing. For this test, a stool sample is checked for hidden (occult) blood, which could be a sign of colorectal cancer. Fecal immunochemical test (FIT). For this test, a stool sample is checked for blood, which could be a sign of colorectal cancer. Stool DNA test. For this test, a stool sample is checked for blood and changes in DNA that could lead to colorectal cancer. Sigmoidoscopy. During this test, a  thin, flexible tube with a camera on the end, called a sigmoidoscope, is used to examine the rectum and the lower colon. Colonoscopy. During this test, a long, flexible tube with a camera on the end, called a colonoscope, is used to examine the entire colon and rectum. Also, sometimes a tissue sample is taken to be looked at under a microscope (biopsy) or small polyps are removed during this test. Virtual colonoscopy. Instead of a colonoscope, this type of colonoscopy uses a CT scan to take pictures of the colon and rectum. A CT scan is a type of X-ray that is made using computers. What are the benefits of screening? Screening reduces your risk for colorectal cancer and can help identify cancer at an early stage, when the cancer can be removed or treated more easily. It is common for polyps to form in the lining of the colon, especially as you age. These polyps may be cancerous or become cancerous over time. Screening can identify these polyps. What are the risks of screening? Generally, these are safe tests. However, problems may occur, including: The need for more tests to confirm results from a stool sample test. Stool sample tests have fewer risks than other types of screening tests. Being exposed to low levels of radiation, if you had a test involving X-rays. This may slightly increase your cancer risk. The benefit of detecting cancer outweighs the slight increase in risk. Bleeding, damage to the intestine, or infection caused by a sigmoidoscopy or colonoscopy. A reaction to medicines given during a sigmoidoscopy or colonoscopy. Talk with your health care provider to understand your risk for colorectal cancer and to make a  screening plan that is right for you. Questions to ask your health care provider When should I start colorectal cancer screening? What is my risk for colorectal cancer? How often do I need screening? Which screening tests do I need? How do I get my test results? What do my  results mean? Where to find more information Learn more about colorectal cancer screening from: The American Cancer Society: cancer.Makena: cancer.gov Summary Colorectal cancer screening is a group of tests used to check for colorectal cancer before symptoms develop. All adults who are 73-60 years old should have screening. Your health care provider may recommend screening before age 60. You may have screening tests starting before age 60, or more often than other people, if you have certain risk factors. Screening reduces your risk for colorectal cancer and can help identify cancer at an early stage, when the cancer can be removed or treated more easily. Talk with your health care provider to understand your risk for colorectal cancer and to make a screening plan that is right for you. This information is not intended to replace advice given to you by your health care provider. Make sure you discuss any questions you have with your health care provider. Document Revised: 10/02/2019 Document Reviewed: 10/02/2019 Elsevier Patient Education  [employer].

## 2022-04-05 NOTE — Assessment & Plan Note (Signed)
TSH checked 

## 2022-04-06 ENCOUNTER — Encounter: Payer: Self-pay | Admitting: Internal Medicine

## 2022-04-06 ENCOUNTER — Encounter: Payer: Self-pay | Admitting: Family Medicine

## 2022-04-06 ENCOUNTER — Telehealth: Payer: Self-pay | Admitting: Family Medicine

## 2022-04-06 LAB — BASIC METABOLIC PANEL
BUN/Creatinine Ratio: 18 (ref 12–28)
BUN: 19 mg/dL (ref 8–27)
CO2: 23 mmol/L (ref 20–29)
Calcium: 9.7 mg/dL (ref 8.7–10.3)
Chloride: 104 mmol/L (ref 96–106)
Creatinine, Ser: 1.05 mg/dL — ABNORMAL HIGH (ref 0.57–1.00)
Glucose: 103 mg/dL — ABNORMAL HIGH (ref 70–99)
Potassium: 4.1 mmol/L (ref 3.5–5.2)
Sodium: 143 mmol/L (ref 134–144)
eGFR: 61 mL/min/{1.73_m2} (ref >59)

## 2022-04-06 LAB — LIPID PANEL
Chol/HDL Ratio: 4.2 ratio (ref 0.0–4.4)
Cholesterol, Total: 135 mg/dL (ref 100–199)
HDL: 32 mg/dL — ABNORMAL LOW (ref >39)
LDL Chol Calc (NIH): 90 mg/dL (ref 0–99)
Triglycerides: 63 mg/dL (ref 0–149)
VLDL Cholesterol Cal: 13 mg/dL (ref 5–40)

## 2022-04-06 LAB — TSH: TSH: 4.31 u[IU]/mL (ref 0.450–4.500)

## 2022-04-06 MED ORDER — EZETIMIBE 10 MG PO TABS: 10.0000 mg | ORAL_TABLET | Freq: Every day | ORAL | 1 refills | Status: DC

## 2022-04-06 NOTE — Telephone Encounter (Signed)
HIPAA compliant callback message left.   Please, let her know that her cholesterol LDL level is not at goal. We need to discuss management plan. Consider restarting Zetia. Also, Kidney test is still mildly elevated. TSH is normal.

## 2022-04-06 NOTE — Addendum Note (Signed)
Addended by: [name] T on: 04/06/2022 08:55 AM   Modules accepted: Orders

## 2022-04-06 NOTE — Telephone Encounter (Signed)
Patient returned my call.  I discussed her test results with her. She stated that she never really took Statins in the past and is uncertain if she reacted to it in the past or not. She had facial swelling to some medications in the past.  She is willing to trial Zetia.  I will send her info about it through MyChart as requested and escribe her meds.

## 2022-04-14 DIAGNOSIS — H16223 Keratoconjunctivitis sicca, not specified as Sjogren's, bilateral: Secondary | ICD-10-CM | POA: Diagnosis not present

## 2022-04-23 DIAGNOSIS — G4733 Obstructive sleep apnea (adult) (pediatric): Secondary | ICD-10-CM | POA: Diagnosis not present

## 2022-04-23 DIAGNOSIS — I10 Essential (primary) hypertension: Secondary | ICD-10-CM | POA: Diagnosis not present

## 2022-05-02 ENCOUNTER — Ambulatory Visit (AMBULATORY_SURGERY_CENTER): Payer: Self-pay

## 2022-05-02 ENCOUNTER — Encounter: Payer: Self-pay | Admitting: Family Medicine

## 2022-05-02 VITALS — Ht 65.0 in | Wt 207.0 lb

## 2022-05-02 DIAGNOSIS — Z86010 Personal history of colonic polyps: Secondary | ICD-10-CM

## 2022-05-02 MED ORDER — NA SULFATE-K SULFATE-MG SULF 17.5-3.13-1.6 GM/177ML PO SOLN: 1.0000 | Freq: Once | ORAL | 0 refills | Status: AC

## 2022-05-02 NOTE — Progress Notes (Unsigned)
Welcome to Medicare/Annual Wellness Visit   Patient: Teresa [name], Female    DOB: March 09, 1961, 61 y.o.   MRN: [id number]  Subjective  Chief Complaint  Patient presents with   Annual Exam    AWV   Also: Shoulder pain and ear sensation  Welcome to Medicare/AWV:  Teresa [name] is a 61 y.o. female who presents today for her Annual Wellness Visit. She reports consuming a low sodium and fruits, vege and healthy diet. Exercise is limited by knee pain. She has not been to the Gym in about a month due to knee pain.  She generally feels fairly well. She reports sleeping well. She does have additional problems to discuss today.   Pain: Left shoulder: Left shoulder pain started many years ago. Worse with ROM and pressure on her left shoulder. No recent injury. Pain is gradually worsening.   Left ear concern:  She complains of a popping sensation in her left ear upon waking up. She occasionally experiences tinnitus but denies any hearing loss. Her pain has significantly improved with chewing gum. She denies any ear discharge or other complaints.   Vision:Within last year and last seen her ophthalmology about 2 weeks ago, next follow up is Nov 9th and Dental: No current dental problems and Last dental visit: August 2024   Patient Active Problem List   Diagnosis Date Noted   Vitreous flashes of both eyes 11/26/2021   Obstructive sleep apnea 07/05/2021   Bertolotti's syndrome 05/26/2021   Cervical radiculopathy 05/11/2021   Fatigue 05/11/2021   CAD in native artery 01/12/2021   Obese 01/11/2021   Peripheral vascular disease ([place]) 06/12/2020   History of forearm fracture 05/01/2020   History of hiatal hernia 05/01/2020   CKD (chronic kidney disease), stage II 01/15/2020   Tubular adenoma of colon 09/14/2019   Chronic diastolic CHF (congestive heart failure) ([place]) 09/14/2019   COVID-19 07/02/2019   Ulnar neuropathy at elbow of right upper extremity 06/12/2018   Migraine 01/12/2018    Hyperlipidemia    History of CVA (cerebrovascular accident) 09/15/2017   History of cholecystectomy 06/27/2017   Small intestinal bacterial overgrowth 03/23/2017   Paresthesia 01/06/2017   Prediabetes 10/29/2015   Tendinopathy of left rotator cuff 10/27/2015   History of amputation of lesser toe, left ([place]) 03/11/2015   Resistant hypertension    Thyroid nodule 02/12/2013   GERD (gastroesophageal reflux disease) 02/06/2012   History of tobacco abuse 09/21/2011   Hypertension 09/21/2011   Hypothyroidism 09/21/2011   Past Medical History:  Diagnosis Date   Allergy    Anemia    Cervical disc disorder with radiculopathy of cervical region 02/10/2016   Chronic lower back pain    Claudication in peripheral vascular disease ([place]) 06/12/2020   Diverticulosis    Ear discomfort 03/11/2016   EKG, abnormal 01/06/2017   Gastritis    GERD (gastroesophageal reflux disease)    H/O hiatal hernia    Hiatal hernia    History of amputation of lesser toe of left foot ([place]) 03/11/2015   History of colon cancer    Hypertension    Hyperthyroidism    hx   Hypothyroidism    Lateral knee pain, left 08/06/2013   Left shoulder pain 05/28/2015   Migraine headache    "a few times/yr" (03/05/2015)   Neck pain 02/11/2014   Neck strain 02/06/2012   Numbness and tingling in right hand 05/16/2018   Osteomyelitis ([place])    [name] 03/05/2015   Osteomyelitis of toe of left foot ([place]) 03/05/2015  Palpitations 01/05/2016   Positive TB test    Sleep apnea    Snoring 02/15/2021   Stroke ([place]) 08/2017   Throat pain 09/21/2011   Normal CT and US neck march 2013, repeat Neck US in 1 year to follow-up small nodules.  Head CT 2013: normal ? glosspharyngeal neuralgia.  On amitriptyline await [facility] EN[facility] referral.    Tubular adenoma of colon    Tubular adenoma of colon    Ulnar neuropathy at elbow of right upper extremity 06/12/2018      Medications: Outpatient Medications Prior to Visit  Medication Sig   aspirin  81 MG chewable tablet Chew 1 tablet (81 mg total) by mouth daily.   carvedilol (COREG) 25 MG tablet TAKE 1 TABLET BY MOUTH TWICE DAILY WITH MEALS   CEQUA 0.09 % SOLN Apply 1 drop to eye 2 (two) times daily.   chlorthalidone (HYGROTON) 25 MG tablet Take 1 tablet by mouth once daily   ezetimibe (ZETIA) 10 MG tablet Take 1 tablet (10 mg total) by mouth daily.   hydrALAZINE (APRESOLINE) 100 MG tablet TAKE 1 TABLET BY MOUTH THREE TIMES DAILY   levothyroxine (SYNTHROID) 175 MCG tablet TAKE 1 TABLET BY MOUTH ONCE DAILY IN THE MORNING 30  MINUTES  BEFORE  FOOD   olopatadine (PATANOL) 0.1 % ophthalmic solution 1 drop 2 (two) times daily.   spironolactone (ALDACTONE) 50 MG tablet Take 1 tablet by mouth once daily   acetaminophen (TYLENOL) 500 MG tablet Take 500 mg by mouth every 6 (six) hours as needed. (Patient not taking: Reported on 05/02/2022)   [EXPIRED] Na Sulfate-K Sulfate-Mg Sulf 17.5-3.13-1.6 GM/177ML SOLN Take 1 kit by mouth once for 1 dose. (Patient not taking: Reported on 05/02/2022)   naproxen (NAPROSYN) 500 MG tablet Take 1 tablet (500 mg total) by mouth 2 (two) times daily as needed. (Patient not taking: Reported on 05/02/2022)   tiZANidine (ZANAFLEX) 2 MG tablet Take 1 tablet (2 mg total) by mouth every 12 (twelve) hours as needed for muscle spasms. (Patient not taking: Reported on 05/02/2022)   [DISCONTINUED] olopatadine (PATANOL) 0.1 % ophthalmic solution 1 drop    No facility-administered medications prior to visit.    Allergies  Allergen Reactions   Amlodipine Swelling and Other (See Comments)    Leg swelling   Bactrim [Sulfamethoxazole-Trimethoprim] Anaphylaxis   Clonidine Derivatives Swelling and Other (See Comments)    Facial swelling   Lisinopril Swelling and Other (See Comments)    Lip swelling   Statins Swelling    Body and muscle aches and face/lip swelling on Statins (Lipitor, Pravachol)   Aspirin Nausea Only    Doses higher than 40m only   Latex Rash    NO POWDERED  GLOVES!!    Patient Care Team: EKinnie Feil MD as PCP - General (Family Medicine) KTroy Sine MD as PCP - Sleep Medicine (Cardiology) SKennith Center RD as Dietitian (Family Medicine) RSkeet Latch MD as Consulting Physician (Cardiology)  Review of Systems  HENT:         Ear popping  Musculoskeletal:  Positive for joint pain.       Left shoulder pain   All other systems reviewed and are negative.    Physical Exam Vitals and nursing note reviewed.  HENT:     Right Ear: Tympanic membrane normal.     Left Ear: Tympanic membrane normal.     Mouth/Throat:     Mouth: Mucous membranes are moist.     Pharynx: No oropharyngeal exudate  or posterior oropharyngeal erythema.  Eyes:     Extraocular Movements: Extraocular movements intact.     Conjunctiva/sclera: Conjunctivae normal.     Pupils: Pupils are equal, round, and reactive to light.  Cardiovascular:     Rate and Rhythm: Normal rate and regular rhythm.     Heart sounds: Normal heart sounds. No murmur heard. Pulmonary:     Effort: Pulmonary effort is normal. No respiratory distress.     Breath sounds: Normal breath sounds. No wheezing.  Abdominal:     General: Bowel sounds are normal. There is no distension.     Palpations: Abdomen is soft. There is no mass.     Tenderness: There is no abdominal tenderness.  Musculoskeletal:     Right shoulder: No deformity. Normal range of motion.     Left shoulder: Tenderness present. No deformity. Normal range of motion.     Cervical back: Neck supple.  Neurological:     Mental Status: She is oriented to person, place, and time.     Cranial Nerves: Cranial nerves 2-12 are intact.     Sensory: Sensation is intact.  Psychiatric:        Behavior: Behavior normal.     Vitals:   05/03/22 0853  BP: 126/67  Pulse: 70  SpO2: 100%  Weight: 206 lb 6 oz (93.6 kg)  Height: _0  (1.651 m)     Most recent functional status assessment:    05/02/2022    1:47 PM  In your  present state of health, do you have any difficulty performing the following activities:  Hearing? 0  Vision? 0  Difficulty concentrating or making decisions? 0  Walking or climbing stairs? 0  Dressing or bathing? 0  Doing errands, shopping? 0   Most recent fall risk assessment:    05/02/2022    1:50 PM  Fall Risk   Falls in the past year? 0    Most recent depression screenings:    05/03/2022    9:39 AM 05/02/2022    1:46 PM  PHQ 2/9 Scores  PHQ - 2 Score 0 2  PHQ- 9 Score 2 4   Most recent cognitive screening:  Mini-Cog - 05/03/22 1151     Normal clock drawing test? yes    How many words correct? 3             Most recent Audit-C alcohol use screening    05/02/2022    1:45 PM  Alcohol Use Disorder Test (AUDIT)  1. How often do you have a drink containing alcohol? 0  2. How many drinks containing alcohol do you have on a typical day when you are drinking? 0  3. How often do you have six or more drinks on one occasion? 0  AUDIT-C Score 0   A score of 3 or more in women, and 4 or more in men indicates increased risk for alcohol abuse, EXCEPT if all of the points are from question 1   Vision/Hearing Screen: Hearing Screening   _1  _2  _3  _4  _5   Right ear _6 Left ear _7 Vision Screening   Right eye Left eye Both eyes  Without correction     With correction _8  Comments: RIGHT EYE WAS A LITTLE BLURRY TO PATIENT     No results found for any visits on 05/03/22.    Assessment & Plan   Annual wellness visit done today including the  all of the following: Reviewed patient's Family Medical History Reviewed and updated list of patient's medical providers Assessment of cognitive impairment was done Assessed patient's functional ability Established a written schedule for health screening [place] Completed and Reviewed Advanced directives discussed and a provided form for review and  completion.  Exercise Activities and Dietary recommendations  Goals      Weight (lb) < 200 lb (90.7 kg)     Start:05/03/22 End: 05/04/23  Plan: Exercise more as tolerated and eat more fruits and vegetables.        Immunization History  Administered Date(s) Administered   Influenza Split 07/17/2012   Influenza,inj,Quad PF,6+ Mos 05/30/2013, 04/15/2014, 05/28/2015, 04/28/2016, 06/28/2017, 04/20/2018, 08/20/2019, 05/03/2021, 04/05/2022   PFIZER Comirnaty(Gray Top)Covid-19 Tri-Sucrose Vaccine 09/25/2020   PFIZER(Purple Top)SARS-COV-2 Vaccination 10/26/2019, 11/23/2019   PNEUMOCOCCAL CONJUGATE-20 05/11/2021   Td 05/27/1998   Tdap 07/17/2012    Health Maintenance  Topic Date Due   Medicare Annual Wellness (AWV)  Never done   COLONOSCOPY (Pts 45-80yr Insurance coverage will need to be confirmed)  11/30/2021   Lung Cancer Screening  01/25/2022   Zoster Vaccines- Shingrix (1 of 2) 11/26/2022 (Originally 03/22/2012)   COVID-19 Vaccine (4 - Pfizer series) 03/28/2023 (Originally 11/20/2020)   TETANUS/TDAP  07/17/2022   MAMMOGRAM  11/26/2023   INFLUENZA VACCINE  Completed   Hepatitis C Screening  Completed   HIV Screening  Completed   HPV VACCINES  Aged Out     Discussed health benefits of physical activity, and encouraged her to engage in regular exercise appropriate for her age and condition.    Problem List Items Addressed This Visit       Cardiovascular and Mediastinum   Peripheral vascular disease ([place]    Stable and compliant with ASA Now on Zetia for HLD.      Chronic diastolic CHF (congestive heart failure) ([facility])    No acute flare.        Other   History of amputation of lesser toe, left ([facility])    No change from her baseline       Other Visit Diagnoses     Welcome to Medicare preventive visit    -  Primary   Encounter for screening for lung cancer       Relevant Orders   CT CHEST LUNG CA SCREEN LOW DOSE W/O CM      Colon cancer screening scheduled.   She is up to date with other health maintenance.  Left ear sensation: Eustachian tube dysfunction. Improved with chewing gum. Monitor for now.   Left Shoulder pain: MRI from 2017 reviewed showing left rotator cuff tendinopathy. No repeat of MRI for now. She had PT and intraarticular steroid injections in the past. Referral to ortho discussed. She agreed with the plan.     KAndrena Mews MD

## 2022-05-02 NOTE — Progress Notes (Signed)

## 2022-05-03 ENCOUNTER — Ambulatory Visit (INDEPENDENT_AMBULATORY_CARE_PROVIDER_SITE_OTHER): Payer: Medicare Other | Admitting: Family Medicine

## 2022-05-03 ENCOUNTER — Telehealth: Payer: Self-pay

## 2022-05-03 ENCOUNTER — Encounter: Payer: Self-pay | Admitting: Family Medicine

## 2022-05-03 VITALS — BP 126/67 | HR 70 | Ht 65.0 in | Wt 206.4 lb

## 2022-05-03 DIAGNOSIS — M67912 Unspecified disorder of synovium and tendon, left shoulder: Secondary | ICD-10-CM | POA: Diagnosis not present

## 2022-05-03 DIAGNOSIS — I5032 Chronic diastolic (congestive) heart failure: Secondary | ICD-10-CM

## 2022-05-03 DIAGNOSIS — Z0000 Encounter for general adult medical examination without abnormal findings: Secondary | ICD-10-CM | POA: Diagnosis not present

## 2022-05-03 DIAGNOSIS — I739 Peripheral vascular disease, unspecified: Secondary | ICD-10-CM

## 2022-05-03 DIAGNOSIS — Z89422 Acquired absence of other left toe(s): Secondary | ICD-10-CM | POA: Diagnosis not present

## 2022-05-03 DIAGNOSIS — Z122 Encounter for screening for malignant neoplasm of respiratory organs: Secondary | ICD-10-CM

## 2022-05-03 DIAGNOSIS — H6992 Unspecified Eustachian tube disorder, left ear: Secondary | ICD-10-CM | POA: Diagnosis not present

## 2022-05-03 NOTE — Assessment & Plan Note (Signed)
No acute flare.

## 2022-05-03 NOTE — Telephone Encounter (Signed)
Spoke with patient informed of her CT at [place] location. December 11th at 8:40. Patient understood. Salvatore Marvel, CMA

## 2022-05-03 NOTE — Assessment & Plan Note (Signed)
Eustachian tube dysfunction. Improved with chewing gum. Monitor for now.

## 2022-05-03 NOTE — Assessment & Plan Note (Addendum)
Stable and compliant with ASA Now on Zetia for HLD.

## 2022-05-03 NOTE — Patient Instructions (Signed)
Eustachian Tube Dysfunction  Eustachian tube dysfunction refers to a condition in which a blockage develops in the narrow passage that connects the middle ear to the back of the nose (eustachian tube). The eustachian tube regulates air pressure in the middle ear by letting air move between the ear and nose. It also helps to drain fluid from the middle ear space. Eustachian tube dysfunction can affect one or both ears. When the eustachian tube does not function properly, air pressure, fluid, or both can build up in the middle ear. What are the causes? This condition occurs when the eustachian tube becomes blocked or cannot open normally. Common causes of this condition include: Ear infections. Colds and other infections that affect the nose, mouth, and throat (upper respiratory tract). Allergies. Irritation from cigarette smoke. Irritation from stomach acid coming up into the esophagus (gastroesophageal reflux). The esophagus is the part of the body that moves food from the mouth to the stomach. Sudden changes in air pressure, such as from descending in an airplane or scuba diving. Abnormal growths in the nose or throat, such as: Growths that line the nose (nasal polyps). Abnormal growth of cells (tumors). Enlarged tissue at the back of the throat (adenoids). What increases the risk? You are more likely to develop this condition if: You smoke. You are overweight. You are a child who has: Certain birth defects of the mouth, such as cleft palate. Large tonsils or adenoids. What are the signs or symptoms? Common symptoms of this condition include: A feeling of fullness in the ear. Ear pain. Clicking or popping noises in the ear. Ringing in the ear (tinnitus). Hearing loss. Loss of balance. Dizziness. Symptoms may get worse when the air pressure around you changes, such as when you travel to an area of high elevation, fly on an airplane, or go scuba diving. How is this diagnosed? This  condition may be diagnosed based on: Your symptoms. A physical exam of your ears, nose, and throat. Tests, such as those that measure: The movement of your eardrum. Your hearing (audiometry). How is this treated? Treatment depends on the cause and severity of your condition. In mild cases, you may relieve your symptoms by moving air into your ears. This is called "popping the ears." In more severe cases, or if you have symptoms of fluid in your ears, treatment may include: Medicines to relieve congestion (decongestants). Medicines that treat allergies (antihistamines). Nasal sprays or ear drops that contain medicines that reduce swelling (steroids). A procedure to drain the fluid in your eardrum. In this procedure, a small tube may be placed in the eardrum to: Drain the fluid. Restore the air in the middle ear space. A procedure to insert a balloon device through the nose to inflate the opening of the eustachian tube (balloon dilation). Follow these instructions at home: Lifestyle Do not do any of the following until your health care provider approves: Travel to high altitudes. Fly in airplanes. Work in a pressurized cabin or room. Scuba dive. Do not use any products that contain nicotine or tobacco. These products include cigarettes, chewing tobacco, and vaping devices, such as e-cigarettes. If you need help quitting, ask your health care provider. Keep your ears dry. Wear fitted earplugs during showering and bathing. Dry your ears completely after. General instructions Take over-the-counter and prescription medicines only as told by your health care provider. Use techniques to help pop your ears as recommended by your health care provider. These may include: Chewing gum. Yawning. Frequent, forceful swallowing.   Closing your mouth, holding your nose closed, and gently blowing as if you are trying to blow air out of your nose. Keep all follow-up visits. This is important. Contact a  health care provider if: Your symptoms do not go away after treatment. Your symptoms come back after treatment. You are unable to pop your ears. You have: A fever. Pain in your ear. Pain in your head or neck. Fluid draining from your ear. Your hearing suddenly changes. You become very dizzy. You lose your balance. Get help right away if: You have a sudden, severe increase in any of your symptoms. Summary Eustachian tube dysfunction refers to a condition in which a blockage develops in the eustachian tube. It can be caused by ear infections, allergies, inhaled irritants, or abnormal growths in the nose or throat. Symptoms may include ear pain or fullness, hearing loss, or ringing in the ears. Mild cases are treated with techniques to unblock the ears, such as yawning or chewing gum. More severe cases are treated with medicines or procedures. This information is not intended to replace advice given to you by your health care provider. Make sure you discuss any questions you have with your health care provider. Document Revised: 08/24/2020 Document Reviewed: 08/24/2020 Elsevier Patient Education  2023 Elsevier Inc.  

## 2022-05-03 NOTE — Assessment & Plan Note (Signed)
MRI from 2017 reviewed showing left rotator cuff tendinopathy. No repeat of MRI for now. She had PT and intraarticular steroid injections in the past. Referral to ortho discussed. She agreed with the plan.

## 2022-05-03 NOTE — Assessment & Plan Note (Signed)
No change from her baseline

## 2022-05-05 DIAGNOSIS — H18892 Other specified disorders of cornea, left eye: Secondary | ICD-10-CM | POA: Diagnosis not present

## 2022-05-05 DIAGNOSIS — H16223 Keratoconjunctivitis sicca, not specified as Sjogren's, bilateral: Secondary | ICD-10-CM | POA: Diagnosis not present

## 2022-05-10 ENCOUNTER — Ambulatory Visit (INDEPENDENT_AMBULATORY_CARE_PROVIDER_SITE_OTHER): Payer: Medicare Other

## 2022-05-10 ENCOUNTER — Ambulatory Visit (INDEPENDENT_AMBULATORY_CARE_PROVIDER_SITE_OTHER): Payer: Medicare Other | Admitting: Orthopaedic Surgery

## 2022-05-10 DIAGNOSIS — M542 Cervicalgia: Secondary | ICD-10-CM

## 2022-05-10 DIAGNOSIS — G8929 Other chronic pain: Secondary | ICD-10-CM

## 2022-05-10 DIAGNOSIS — M25512 Pain in left shoulder: Secondary | ICD-10-CM

## 2022-05-10 NOTE — Progress Notes (Signed)
Office Visit Note   Patient: Teresa [name]           Date of Birth: [birth year]           MRN: [id number] Visit Date: 05/10/2022              Requested by: Kinnie Feil, MD [address] [place],  [place] [address] PCP: Kinnie Feil, MD   Assessment & Plan: Visit Diagnoses:  1. Chronic left shoulder pain   2. Cervicalgia     Plan: Impression is left trapezius myofascial syndrome.  I have a low suspicion for structural problems in her shoulder and neck.  I have recommended referral back to outpatient PT for dry needling and soft tissue mobilization.  Follow-up as needed.  Follow-Up Instructions: No follow-ups on file.   Orders:  Orders Placed This Encounter  Procedures   XR Cervical Spine 2 or 3 views   XR Shoulder Left   Ambulatory referral to Physical Therapy   No orders of the defined types were placed in this encounter.     Procedures: No procedures performed   Clinical Data: No additional findings.   Subjective: Chief Complaint  Patient presents with   Left Shoulder - Pain    HPI [name] is a 61 year old female comes in for evaluation of left shoulder pain.  She localizes the pain to the left trapezius region.  She has had pain for about a month.  Has increased pain when she is lying down.  Left-hand-dominant.  Denies any radicular symptoms.  She has seen her primary care doctor for years and she has been sent to physical therapy multiple times.  She has had an MRI of her left shoulder and cervical spine back in 2017 which were both essentially unremarkable.  She did have a stroke in 2019.  Review of Systems  Constitutional: Negative.   HENT: Negative.    Eyes: Negative.   Respiratory: Negative.    Cardiovascular: Negative.   Endocrine: Negative.   Musculoskeletal: Negative.   Neurological: Negative.   Hematological: Negative.   Psychiatric/Behavioral: Negative.    All other systems reviewed and are negative.    Objective: Vital  Signs: There were no vitals taken for this visit.  Physical Exam Vitals and nursing note reviewed.  Constitutional:      Appearance: She is well-developed.  Pulmonary:     Effort: Pulmonary effort is normal.  Skin:    General: Skin is warm.     Capillary Refill: Capillary refill takes less than 2 seconds.  Neurological:     Mental Status: She is alert and oriented to person, place, and time.  Psychiatric:        Behavior: Behavior normal.        Thought Content: Thought content normal.        Judgment: Judgment normal.     Ortho Exam Examination of left shoulder shows no focal findings.  Manual muscle testing the rotator cuff is normal.  Normal range of motion with mild pain.  Localizes the pain to the trapezius region. Examination of the cervical spine shows discomfort with rotation and lateral bending.  Negative Spurling's.  No focal motor or sensory deficits in the upper extremity. Specialty Comments:  No specialty comments available.  Imaging: XR Cervical Spine 2 or 3 views  Result Date: 05/10/2022 AP and lateral x-rays of the cervical spine show straightening.  No acute or structural normalities.  Well-preserved intervertebral disc spaces.  XR Shoulder Left  Result Date: 05/10/2022 Three-view x-rays of the left shoulder show no acute or structural abnormalities.  Downward sloping acromion.  Mild degenerative changes of the [facility] joint.    PMFS History: Patient Active Problem List   Diagnosis Date Noted   Eustachian tube dysfunction, left 05/03/2022   Vitreous flashes of both eyes 11/26/2021   Obstructive sleep apnea 07/05/2021   Bertolotti's syndrome 05/26/2021   Cervical radiculopathy 05/11/2021   Fatigue 05/11/2021   CAD in native artery 01/12/2021   Obese 01/11/2021   Peripheral vascular disease ([place]) 06/12/2020   History of forearm fracture 05/01/2020   History of hiatal hernia 05/01/2020   CKD (chronic kidney disease), stage II 01/15/2020   Tubular adenoma  of colon 09/14/2019   Chronic diastolic CHF (congestive heart failure) ([place]) 09/14/2019   COVID-19 07/02/2019   Ulnar neuropathy at elbow of right upper extremity 06/12/2018   Migraine 01/12/2018   Hyperlipidemia    History of CVA (cerebrovascular accident) 09/15/2017   History of cholecystectomy 06/27/2017   Small intestinal bacterial overgrowth 03/23/2017   Paresthesia 01/06/2017   Prediabetes 10/29/2015   Tendinopathy of left rotator cuff 10/27/2015   History of amputation of lesser toe, left ([place]) 03/11/2015   Resistant hypertension    Thyroid nodule 02/12/2013   GERD (gastroesophageal reflux disease) 02/06/2012   History of tobacco abuse 09/21/2011   Hypertension 09/21/2011   Hypothyroidism 09/21/2011   Past Medical History:  Diagnosis Date   Allergy    Anemia    Cervical disc disorder with radiculopathy of cervical region 02/10/2016   Chronic lower back pain    Claudication in peripheral vascular disease ([place]) 06/12/2020   Diverticulosis    Ear discomfort 03/11/2016   EKG, abnormal 01/06/2017   Gastritis    GERD (gastroesophageal reflux disease)    H/O hiatal hernia    Hiatal hernia    History of amputation of lesser toe of left foot ([place]) 03/11/2015   History of colon cancer    Hypertension    Hyperthyroidism    hx   Hypothyroidism    Lateral knee pain, left 08/06/2013   Left shoulder pain 05/28/2015   Migraine headache    "a few times/yr" (03/05/2015)   Neck pain 02/11/2014   Neck strain 02/06/2012   Numbness and tingling in right hand 05/16/2018   Osteomyelitis ([place])    [name] 03/05/2015   Osteomyelitis of toe of left foot ([place]) 03/05/2015   Palpitations 01/05/2016   Positive TB test    Sleep apnea    Snoring 02/15/2021   Stroke ([place]) 08/2017   Throat pain 09/21/2011   Normal CT and US neck march 2013, repeat Neck US in 1 year to follow-up small nodules.  Head CT 2013: normal ? glosspharyngeal neuralgia.  On amitriptyline await [facility] ENt referral.     Tubular adenoma of colon    Tubular adenoma of colon    Ulnar neuropathy at elbow of right upper extremity 06/12/2018    Family History  Problem Relation Age of Onset   Heart disease Mother        CAB age 101   Diabetes Mother    Cancer Father 29       pancreatic cancer   Stroke Father    Pancreatic cancer Sister    Diabetes Sister    Kidney disease Sister        renal failure   Diabetes Brother    Kidney disease Brother        renal failure   Breast  cancer Maternal Aunt        per pt aunts and uncles died of cancer not sure the type   Breast cancer Paternal Aunt    Cancer Paternal Uncle    Cancer Sister    Esophageal cancer Neg Hx    Rectal cancer Neg Hx    Stomach cancer Neg Hx    Thyroid disease Neg Hx    Colon cancer Neg Hx    Colon polyps Neg Hx     Past Surgical History:  Procedure Laterality Date   ABDOMINAL HYSTERECTOMY  2006   AMPUTATION TOE Left 03/05/2015   Procedure: AMPUTATION LEFT 5TH  TOE;  Surgeon: Wylene Simmer, MD;  Location: [place];  Service: Orthopedics;  Laterality: Left;   APPENDECTOMY  2006   BREAST BIOPSY Left 11/26/2014   CESAREAN SECTION  1979 1986 1990   CHOLECYSTECTOMY     COLONOSCOPY     GALLBLADDER SURGERY     LOOP RECORDER INSERTION N/A 09/18/2017   Procedure: LOOP RECORDER INSERTION;  Surgeon: Evans Lance, MD;  Location: [place] [facility];  Service: Cardiovascular;  Laterality: N/A;   OPEN REDUCTION INTERNAL FIXATION (ORIF) DISTAL RADIAL FRACTURE Right 09/14/2019   Procedure: OPEN REDUCTION INTERNAL FIXATION (ORIF)  RADIAL AND ULNA FRACTURE;  Surgeon: Iran Planas, MD;  Location: [place];  Service: Orthopedics;  Laterality: Right;   ORIF ULNAR FRACTURE Right 09/14/2019   TEE WITHOUT CARDIOVERSION N/A 09/18/2017   Procedure: TRANSESOPHAGEAL ECHOCARDIOGRAM (TEE);  Surgeon: Pixie Casino, MD;  Location: [facility] ENDOSCOPY;  Service: Cardiovascular;  Laterality: N/A;   TOE AMPUTATION Left 03/05/2015   5th toe   TUBAL LIGATION Bilateral 1990    UPPER GASTROINTESTINAL ENDOSCOPY     Social History   Occupational History   Occupation: cosmetolgist  Tobacco Use   Smoking status: Former    Packs/day: 1.00    Years: 36.00    Total pack years: 36.00    Types: Cigarettes    Quit date: 09/23/2017    Years since quitting: 4.6   Smokeless tobacco: Never   Tobacco comments:    Tobacco info given 03/01/17  Vaping Use   Vaping Use: Never used  Substance and Sexual Activity   Alcohol use: Never   Drug use: Never   Sexual activity: Not Currently    Birth control/protection: Post-menopausal, Surgical

## 2022-05-13 ENCOUNTER — Other Ambulatory Visit: Payer: Self-pay

## 2022-05-13 ENCOUNTER — Ambulatory Visit: Payer: Medicare Other | Attending: Orthopaedic Surgery | Admitting: Physical Therapy

## 2022-05-13 ENCOUNTER — Encounter: Payer: Self-pay | Admitting: Physical Therapy

## 2022-05-13 DIAGNOSIS — M25512 Pain in left shoulder: Secondary | ICD-10-CM | POA: Insufficient documentation

## 2022-05-13 DIAGNOSIS — R293 Abnormal posture: Secondary | ICD-10-CM | POA: Insufficient documentation

## 2022-05-13 DIAGNOSIS — G8929 Other chronic pain: Secondary | ICD-10-CM | POA: Diagnosis not present

## 2022-05-13 DIAGNOSIS — M542 Cervicalgia: Secondary | ICD-10-CM | POA: Insufficient documentation

## 2022-05-13 DIAGNOSIS — M6281 Muscle weakness (generalized): Secondary | ICD-10-CM | POA: Insufficient documentation

## 2022-05-13 NOTE — Therapy (Signed)
OUTPATIENT PHYSICAL THERAPY CERVICAL/SHOULDER EVALUATION   Patient Name: Teresa [name] MRN: [id number] DOB:1960/07/19, 61 y.o., female Today's Date: 05/13/2022  END OF SESSION:  PT End of Session - 05/13/22 0925     Visit Number 1    Number of Visits 13    Date for PT Re-Evaluation 07/08/22    Authorization Type UHC Medicare/medicaid    Progress Note Due on Visit 10    PT Start Time 0926    PT Stop Time 1006    PT Time Calculation (min) 40 min    Activity Tolerance Patient tolerated treatment well;Patient limited by pain    Behavior During Therapy [facility] for tasks assessed/performed             Past Medical History:  Diagnosis Date   Allergy    Anemia    Cervical disc disorder with radiculopathy of cervical region 02/10/2016   Chronic lower back pain    Claudication in peripheral vascular disease ([place]) 06/12/2020   Diverticulosis    Ear discomfort 03/11/2016   EKG, abnormal 01/06/2017   Gastritis    GERD (gastroesophageal reflux disease)    H/O hiatal hernia    Hiatal hernia    History of amputation of lesser toe of left foot ([place]) 03/11/2015   History of colon cancer    Hypertension    Hyperthyroidism    hx   Hypothyroidism    Lateral knee pain, left 08/06/2013   Left shoulder pain 05/28/2015   Migraine headache    "a few times/yr" (03/05/2015)   Neck pain 02/11/2014   Neck strain 02/06/2012   Numbness and tingling in right hand 05/16/2018   Osteomyelitis ([place])    [name] 03/05/2015   Osteomyelitis of toe of left foot ([place]) 03/05/2015   Palpitations 01/05/2016   Positive TB test    Sleep apnea    Snoring 02/15/2021   Stroke ([place]) 08/2017   Throat pain 09/21/2011   Normal CT and US neck march 2013, repeat Neck US in 1 year to follow-up small nodules.  Head CT 2013: normal ? glosspharyngeal neuralgia.  On amitriptyline await [facility] ENt referral.    Tubular adenoma of colon    Tubular adenoma of colon    Ulnar neuropathy at elbow of right upper extremity  06/12/2018   Past Surgical History:  Procedure Laterality Date   ABDOMINAL HYSTERECTOMY  2006   AMPUTATION TOE Left 03/05/2015   Procedure: AMPUTATION LEFT 5TH  TOE;  Surgeon: Wylene Simmer, MD;  Location: [facility];  Service: Orthopedics;  Laterality: Left;   APPENDECTOMY  2006   BREAST BIOPSY Left 11/26/2014   CESAREAN SECTION  1979 1986 1990   CHOLECYSTECTOMY     COLONOSCOPY     GALLBLADDER SURGERY     LOOP RECORDER INSERTION N/A 09/18/2017   Procedure: LOOP RECORDER INSERTION;  Surgeon: Evans Lance, MD;  Location: [place] [facility];  Service: Cardiovascular;  Laterality: N/A;   OPEN REDUCTION INTERNAL FIXATION (ORIF) DISTAL RADIAL FRACTURE Right 09/14/2019   Procedure: OPEN REDUCTION INTERNAL FIXATION (ORIF)  RADIAL AND ULNA FRACTURE;  Surgeon: Iran Planas, MD;  Location: [place];  Service: Orthopedics;  Laterality: Right;   ORIF ULNAR FRACTURE Right 09/14/2019   TEE WITHOUT CARDIOVERSION N/A 09/18/2017   Procedure: TRANSESOPHAGEAL ECHOCARDIOGRAM (TEE);  Surgeon: Pixie Casino, MD;  Location: [facility] ENDOSCOPY;  Service: Cardiovascular;  Laterality: N/A;   TOE AMPUTATION Left 03/05/2015   5th toe   TUBAL LIGATION Bilateral 1990   UPPER GASTROINTESTINAL ENDOSCOPY  Patient Active Problem List   Diagnosis Date Noted   Eustachian tube dysfunction, left 05/03/2022   Vitreous flashes of both eyes 11/26/2021   Obstructive sleep apnea 07/05/2021   Bertolotti's syndrome 05/26/2021   Cervical radiculopathy 05/11/2021   Fatigue 05/11/2021   CAD in native artery 01/12/2021   Obese 01/11/2021   Peripheral vascular disease ([place]) 06/12/2020   History of forearm fracture 05/01/2020   History of hiatal hernia 05/01/2020   CKD (chronic kidney disease), stage II 01/15/2020   Tubular adenoma of colon 09/14/2019   Chronic diastolic CHF (congestive heart failure) ([place]) 09/14/2019   COVID-19 07/02/2019   Ulnar neuropathy at elbow of right upper extremity 06/12/2018   Migraine 01/12/2018    Hyperlipidemia    History of CVA (cerebrovascular accident) 09/15/2017   History of cholecystectomy 06/27/2017   Small intestinal bacterial overgrowth 03/23/2017   Paresthesia 01/06/2017   Prediabetes 10/29/2015   Tendinopathy of left rotator cuff 10/27/2015   History of amputation of lesser toe, left ([place]) 03/11/2015   Resistant hypertension    Thyroid nodule 02/12/2013   GERD (gastroesophageal reflux disease) 02/06/2012   History of tobacco abuse 09/21/2011   Hypertension 09/21/2011   Hypothyroidism 09/21/2011    PCP: Kinnie Feil, MD  REFERRING PROVIDER: Leandrew Koyanagi, MD  REFERRING DIAG: [phone] (ICD-10-CM) - Chronic left shoulder pain M54.2 (ICD-10-CM) - Cervicalgia  THERAPY DIAG:  Cervicalgia  Left shoulder pain, unspecified chronicity  Abnormal posture  Muscle weakness (generalized)  Rationale for Evaluation and Treatment: Rehabilitation  ONSET DATE: since end of October, woke up one morning with neck pain  SUBJECTIVE:                                                                                                                                                                                                         SUBJECTIVE STATEMENT: Pt states she woke up one morning with neck/shoulder pain. Felt that she had slept wrong and symptoms just haven't gone away, changed her mattress and pillows without relief. L hand dominant, symptoms all on L side. Feels like there is a knot in her neck. Hasn't been to the gym, typically goes 5 days a week. Reports pain shooting into anterior shoulder with neck retraction. Reports chronic headaches but does sometimes have "pressure" in occipital region that improves with self massage. Denies any visual changes, difficulty swallowing, dizziness. This Probation officer did note that R eye appears more open than L eye - pt reports history of bulging eye from thyroid issues and states that her L eye has had some irritation  prompting lower  eyelid on L, states this is ongoing and her providers are aware, currently getting treatment. No neuro concerns in subjective  PERTINENT HISTORY:  GERD, migraines, HTN, L toe amputation, hx CVA, CKD2, CHF/CAD, OSA  PAIN:  Are you having pain: yes 5/10  Location: L neck/shoulder, upper trap area into anterior shoulder How would you describe your pain? Shooting  Best in past week: 5/10 Worst in past week: 9/10 Aggravating factors: reaching forward, cleaning, upper body dressing Easing factors: medication, ice/heat, stretching (hurts while doing it but improves after)  PRECAUTIONS: standard   WEIGHT BEARING RESTRICTIONS: No  FALLS:  Has patient fallen in last 6 months? 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)  PLOF: Independent  PATIENT GOALS: reduce pain and keep it from coming back, return to gym activities    OBJECTIVE:   DIAGNOSTIC FINDINGS:  Grossly unremarkable cervical/GH X rays per chart review - downward sloping acromion and mild degenerative changes AC joint, straightening of cervical spine but no acute issues per result narratives  PATIENT SURVEYS:  QUICK DASH: 40.9% NDI: 32%  COGNITION: Overall cognitive status: Within functional limits for tasks assessed  SENSATION/NEURO: Light touch intact B UE aside from mildly diminished C4 on L No other neuro complaints  POSTURE: forward head, B elevated UT more prominent on R, rounded shoulders  PALPATION: Concordant tenderness with trigger points L UT, LS. Mild tenderness paracervical musculature and rhomboid, all of above improves with brief STM and trigger point release Hypomobility/tenderness mid cervical spine   CERVICAL ROM:   Active ROM A/PROM (deg) eval  Flexion 50%p!  Extension 75%  Right lateral flexion 50% relieving stretch  Left lateral flexion 75%  Right rotation 42  Left rotation 25   (Blank rows =  not tested) Comments: flexion more painful than extension UPPER EXTREMITY ROM:  Active ROM Right eval Left eval  Shoulder flexion 170 140  Shoulder abduction    Shoulder internal rotation    Shoulder external rotation    Elbow flexion    Elbow extension    Wrist flexion    Wrist extension     (Blank rows = not tested) Comments: deferred further testing given symptom irritability  UPPER EXTREMITY MMT:  MMT Right eval Left eval  Shoulder flexion    Shoulder extension    Shoulder abduction    Shoulder extension    Shoulder internal rotation    Shoulder external rotation    Elbow flexion    Elbow extension    Grip strength     (Blank rows = not tested)   Comments: deferred on eval given symptom irritability  CERVICAL SPECIAL TESTS:  Deferred on this date, will assess as able/appropriate  FUNCTIONAL TESTS:  Functional reach overhead limited and painful, increased compensatory UT elevation  TODAY'S TREATMENT:  [place] Adult PT Treatment:                                      DATE: 05/13/22 Therapeutic Exercise: Scap retraction x10 cues for form and HEP Sidebending stretch to R only 2x30sec cues for form and HEP Manual Therapy: Brief STM L LS, UT, rhomboid with report of good relief  PATIENT EDUCATION:  Education details: Pt education on PT impairments, prognosis, and POC. Informed consent. Rationale for interventions, safe/appropriate HEP performance Person educated: Patient Education method: Explanation, Demonstration, Tactile cues, Verbal cues, and Handouts Education comprehension: verbalized understanding, returned demonstration, verbal cues required, tactile cues required, and needs further education    HOME EXERCISE PROGRAM: Access Code: [email or web address] URL: [email or web address] Date: 05/13/2022 Prepared by: Enis Slipper  Exercises - Seated Scapular Retraction  - 1 x daily - 7 x weekly - 3 sets - 10 reps - Seated Cervical Sidebending Stretch  - 1 x daily - 7 x weekly - 1 sets - 3 reps - 30sec hold  ASSESSMENT:  CLINICAL IMPRESSION: Pt is a 61 year old woman who arrives to PT evaluation on this date for neck/shoulder pain. Pt reports difficulty with self care and work activities due to pain. During today's session pt demonstrates limitations in cervical and GH mobility which are limiting ability to perform aforementioned activities. Concordant pain is reproduced with palpation of trigger points in L UT and LS. Shoulder exam limited by symptom irritability although she reports improvement in pain to baseline after brief STM to periscapular musculature. Performs HEP without increase in pain, verbalizes good understanding of appropriate performance. No adverse events, pt departs in no acute distress with all voiced questions addressed appropriately from PT perspective.  Recommend skilled PT to address aforementioned deficits to improve functional independence/tolerance.   OBJECTIVE IMPAIRMENTS: decreased activity tolerance, decreased endurance, decreased mobility, decreased ROM, decreased strength, hypomobility, impaired UE functional use, postural dysfunction, and pain.   ACTIVITY LIMITATIONS: carrying, lifting, bending, squatting, sleeping, dressing, and reach over head  PARTICIPATION LIMITATIONS: cleaning, laundry, driving, community activity, and occupation  PERSONAL FACTORS: 3+ comorbidities: GERD, migraines, HTN, L toe amputation, hx CVA, CKD2, CHF/CAD, PVD, OSA  are also affecting patient's functional outcome.   REHAB POTENTIAL: Good  CLINICAL DECISION MAKING: Stable/uncomplicated  EVALUATION COMPLEXITY: Low   GOALS: Goals reviewed with patient? No  SHORT TERM GOALS: Target date: 06/03/2022 Pt will demonstrate appropriate understanding and performance of initially prescribed HEP in order to  facilitate improved independence with management of symptoms.  Baseline: HEP provided on eval Goal status: INITIAL   2. Pt will score less than or equal to 31% on Quick DASH in order to demonstrate improved perception of function due to symptoms.  Baseline: 40.9%  Goal status: INITIAL   LONG TERM GOALS: Target date: 06/24/2022 Pt will less than or equal to 20% on NDI in order to demonstrate improved perception of function due to symptoms ([place] 10-13 pts per Corwin Levins al 2009, 2010). Baseline: 32% Goal status: INITIAL  2. Pt will score less than or equal to 25% on Quick DASH in order to indicate reduced levels of disability due to shoulder pain ([facility] 16-20pts).  Baseline: 40.9%  Goal status: INITIAL  3.  Pt will demonstrate at least 160 degrees of L active shoulder elevation in order to demonstrate improved tolerance to functional movement patterns such as cleaning and reaching overhead.  Baseline: see ROM chart above  Goal status: INITIAL   4. Pt will report/demonstrate ability to push/pull up to 50# with less than 2 point increase in pain on NPS in order to indicate improved tolerance to work activities.   Baseline: Pt reports up to 9/10 pain on NPS with work activities such as pushing/pulling cleaning cart  Goal status: INITIAL   5. Pt will demonstrate at least 50 deg of cervical rotation bilaterally with less than 3/10 pain on NPS in order to facilitate improved environmental awareness and safety while driving.   Baseline: see ROM chart above  Goal status: INITIAL    PLAN:  PT FREQUENCY: 2x/week  PT DURATION: 6 weeks  PLANNED INTERVENTIONS: Therapeutic exercises, Therapeutic activity, Neuromuscular re-education, Balance training, Gait training, Patient/Family education, Self Care, Joint mobilization, Vestibular training, Aquatic Therapy, Dry Needling, Spinal mobilization, Cryotherapy, Moist heat, Taping, Manual therapy, and Re-evaluation  PLAN FOR NEXT SESSION: Progress  ROM/strengthening exercises as able/appropriate, review HEP.    Leeroy Cha PT, DPT 05/13/2022 11:37 AM

## 2022-05-17 ENCOUNTER — Encounter: Payer: Self-pay | Admitting: Physical Therapy

## 2022-05-17 ENCOUNTER — Ambulatory Visit: Payer: Medicare Other | Admitting: Physical Therapy

## 2022-05-17 DIAGNOSIS — M542 Cervicalgia: Secondary | ICD-10-CM

## 2022-05-17 DIAGNOSIS — M25512 Pain in left shoulder: Secondary | ICD-10-CM

## 2022-05-17 DIAGNOSIS — R293 Abnormal posture: Secondary | ICD-10-CM | POA: Diagnosis not present

## 2022-05-17 DIAGNOSIS — M6281 Muscle weakness (generalized): Secondary | ICD-10-CM | POA: Diagnosis not present

## 2022-05-17 DIAGNOSIS — G8929 Other chronic pain: Secondary | ICD-10-CM | POA: Diagnosis not present

## 2022-05-17 NOTE — Therapy (Signed)
OUTPATIENT PHYSICAL THERAPY TREATMENT NOTE   Patient Name: Teresa [name] MRN: [id number] DOB:Dec 10, 1960, 61 y.o., female Today's Date: 05/17/2022  PCP: Kinnie Feil, MD  REFERRING PROVIDER: Leandrew Koyanagi, MD   END OF SESSION:   PT End of Session - 05/17/22 1322     Visit Number 2    Number of Visits 13    Date for PT Re-Evaluation 07/08/22    Authorization Type [employer] Medicare/medicaid    Progress Note Due on Visit 10    PT Start Time 1330    PT Stop Time 1415    PT Time Calculation (min) 45 min    Activity Tolerance Patient tolerated treatment well;Patient limited by pain    Behavior During Therapy [facility] for tasks assessed/performed             Past Medical History:  Diagnosis Date   Allergy    Anemia    Cervical disc disorder with radiculopathy of cervical region 02/10/2016   Chronic lower back pain    Claudication in peripheral vascular disease ([place]) 06/12/2020   Diverticulosis    Ear discomfort 03/11/2016   EKG, abnormal 01/06/2017   Gastritis    GERD (gastroesophageal reflux disease)    H/O hiatal hernia    Hiatal hernia    History of amputation of lesser toe of left foot ([place]) 03/11/2015   History of colon cancer    Hypertension    Hyperthyroidism    hx   Hypothyroidism    Lateral knee pain, left 08/06/2013   Left shoulder pain 05/28/2015   Migraine headache    "a few times/yr" (03/05/2015)   Neck pain 02/11/2014   Neck strain 02/06/2012   Numbness and tingling in right hand 05/16/2018   Osteomyelitis ([place])    [name] 03/05/2015   Osteomyelitis of toe of left foot ([place]) 03/05/2015   Palpitations 01/05/2016   Positive TB test    Sleep apnea    Snoring 02/15/2021   Stroke ([place]) 08/2017   Throat pain 09/21/2011   Normal CT and US neck march 2013, repeat Neck US in 1 year to follow-up small nodules.  Head CT 2013: normal ? glosspharyngeal neuralgia.  On amitriptyline await [facility] ENt referral.    Tubular adenoma of colon    Tubular adenoma of colon     Ulnar neuropathy at elbow of right upper extremity 06/12/2018   Past Surgical History:  Procedure Laterality Date   ABDOMINAL HYSTERECTOMY  2006   AMPUTATION TOE Left 03/05/2015   Procedure: AMPUTATION LEFT 5TH  TOE;  Surgeon: Wylene Simmer, MD;  Location: [place];  Service: Orthopedics;  Laterality: Left;   APPENDECTOMY  2006   BREAST BIOPSY Left 11/26/2014   CESAREAN SECTION  1979 1986 1990   CHOLECYSTECTOMY     COLONOSCOPY     GALLBLADDER SURGERY     LOOP RECORDER INSERTION N/A 09/18/2017   Procedure: LOOP RECORDER INSERTION;  Surgeon: Evans Lance, MD;  Location: [place] [facility];  Service: Cardiovascular;  Laterality: N/A;   OPEN REDUCTION INTERNAL FIXATION (ORIF) DISTAL RADIAL FRACTURE Right 09/14/2019   Procedure: OPEN REDUCTION INTERNAL FIXATION (ORIF)  RADIAL AND ULNA FRACTURE;  Surgeon: Iran Planas, MD;  Location: [place];  Service: Orthopedics;  Laterality: Right;   ORIF ULNAR FRACTURE Right 09/14/2019   TEE WITHOUT CARDIOVERSION N/A 09/18/2017   Procedure: TRANSESOPHAGEAL ECHOCARDIOGRAM (TEE);  Surgeon: Pixie Casino, MD;  Location: [facility];  Service: Cardiovascular;  Laterality: N/A;   TOE AMPUTATION Left 03/05/2015  5th toe   TUBAL LIGATION Bilateral 1990   UPPER GASTROINTESTINAL ENDOSCOPY     Patient Active Problem List   Diagnosis Date Noted   Eustachian tube dysfunction, left 05/03/2022   Vitreous flashes of both eyes 11/26/2021   Obstructive sleep apnea 07/05/2021   Bertolotti's syndrome 05/26/2021   Cervical radiculopathy 05/11/2021   Fatigue 05/11/2021   CAD in native artery 01/12/2021   Obese 01/11/2021   Peripheral vascular disease ([place]) 06/12/2020   History of forearm fracture 05/01/2020   History of hiatal hernia 05/01/2020   CKD (chronic kidney disease), stage II 01/15/2020   Tubular adenoma of colon 09/14/2019   Chronic diastolic CHF (congestive heart failure) ([place]) 09/14/2019   COVID-19 07/02/2019   Ulnar neuropathy at elbow of right  upper extremity 06/12/2018   Migraine 01/12/2018   Hyperlipidemia    History of CVA (cerebrovascular accident) 09/15/2017   History of cholecystectomy 06/27/2017   Small intestinal bacterial overgrowth 03/23/2017   Paresthesia 01/06/2017   Prediabetes 10/29/2015   Tendinopathy of left rotator cuff 10/27/2015   History of amputation of lesser toe, left ([place]) 03/11/2015   Resistant hypertension    Thyroid nodule 02/12/2013   GERD (gastroesophageal reflux disease) 02/06/2012   History of tobacco abuse 09/21/2011   Hypertension 09/21/2011   Hypothyroidism 09/21/2011    REFERRING DIAG: M25.512,G89.29 (ICD-10-CM) - Chronic left shoulder pain M54.2 (ICD-10-CM) - Cervicalgia  THERAPY DIAG:  Cervicalgia  Left shoulder pain, unspecified chronicity  Abnormal posture  Rationale for Evaluation and Treatment Rehabilitation  PERTINENT HISTORY: GERD, migraines, HTN, L toe amputation, hx CVA, CKD2, CHF/CAD, OSA   PRECAUTIONS: standard  SUBJECTIVE:                                                                                                                                                                                      SUBJECTIVE STATEMENT:   "Still having the pain in the L shoulder, and feels like it is radiating down into the shoulder. I don't know if its the rain or the sleeping I can   PAIN:  Are you having pain? Yes: NPRS scale: 7/10 Pain location: L shoulder/ upper trap Pain description: aching/ sore Aggravating factors: unsure,  Relieving factors: falling asleep   OBJECTIVE: (objective measures completed at initial evaluation unless otherwise dated)   DIAGNOSTIC FINDINGS:  Grossly unremarkable cervical/GH X rays per chart review - downward sloping acromion and mild degenerative changes AC joint, straightening of cervical spine but no acute issues per result narratives   PATIENT SURVEYS:  QUICK DASH: 40.9% NDI: 32%   COGNITION: Overall cognitive status: Within  functional limits for tasks assessed   SENSATION/NEURO: Light touch intact  B UE aside from mildly diminished C4 on L No other neuro complaints   POSTURE: forward head, B elevated UT more prominent on R, rounded shoulders   PALPATION: Concordant tenderness with trigger points L UT, LS. Mild tenderness paracervical musculature and rhomboid, all of above improves with brief STM and trigger point release Hypomobility/tenderness mid cervical spine          CERVICAL ROM:    Active ROM A/PROM (deg) eval  Flexion 50%p!  Extension 75%  Right lateral flexion 50% relieving stretch  Left lateral flexion 75%  Right rotation 42  Left rotation 25   (Blank rows = not tested) Comments: flexion more painful than extension UPPER EXTREMITY ROM:   Active ROM Right eval Left eval  Shoulder flexion 170 140  Shoulder abduction      Shoulder internal rotation      Shoulder external rotation      Elbow flexion      Elbow extension      Wrist flexion      Wrist extension       (Blank rows = not tested) Comments: deferred further testing given symptom irritability   UPPER EXTREMITY MMT:   MMT Right eval Left eval  Shoulder flexion      Shoulder extension      Shoulder abduction      Shoulder extension      Shoulder internal rotation      Shoulder external rotation      Elbow flexion      Elbow extension      Grip strength       (Blank rows = not tested)    Comments: deferred on eval given symptom irritability   CERVICAL SPECIAL TESTS:  Deferred on this date, will assess as able/appropriate   FUNCTIONAL TESTS:  Functional reach overhead limited and painful, increased compensatory UT elevation   TODAY'S TREATMENT:                                                                                                                             [facility] Adult PT Treatment:                                                DATE: 05/17/2022 Therapeutic Exercise: Scapular retraction 2 x 10 holding  5 seconds Supine Chin tuck in supine 2 x 10 holding 2 seconds Upper trap stretch 2 x 30  Supine scapular retraction with ER 2 x 10 with GTB. Manual Therapy: IASTM along the L upper trap Cervical mobs PA and L Gapping C3-C6 grade III L first rib mobs  Manual cervical traction   Trigger Point Dry-Needling  Treatment instructions: Expect mild to moderate muscle soreness. S/S of pneumothorax if dry needled over a lung field, and to seek immediate medical attention should they occur. Patient  verbalized understanding of these instructions and education.  Patient Consent Given: Yes Education handout provided: Yes Muscles treated: L upper trap Electrical stimulation performed: Yes Parameters:  CPS Lvl 25, x 8 min increased to tolerance Treatment response/outcome: twitch response and reduction of tension     [facility] Adult PT Treatment:                                      DATE: 05/13/22 Therapeutic Exercise: Scap retraction x10 cues for form and HEP Sidebending stretch to R only 2x30sec cues for form and HEP Manual Therapy: Brief STM L LS, UT, rhomboid with report of good relief   PATIENT EDUCATION:  Education details: Pt education on PT impairments, prognosis, and POC. Informed consent. Rationale for interventions, safe/appropriate HEP performance Person educated: Patient Education method: Explanation, Demonstration, Tactile cues, Verbal cues, and Handouts Education comprehension: verbalized understanding, returned demonstration, verbal cues required, tactile cues required, and needs further education     HOME EXERCISE PROGRAM: Access Code: [email or web address] URL: [email or web address] Date: 05/17/2022 Prepared by: Starr Lake  Exercises - Seated Scapular Retraction  - 1 x daily - 7 x weekly - 3 sets - 10 reps - Seated Cervical Sidebending Stretch  - 1 x daily - 7 x weekly - 1 sets - 3 reps - 30sec hold - Supine Chin Tuck  - 1 x daily - 7 x weekly - 2 sets - 10 reps -  Standing Cervical Retraction  - 1 x daily - 7 x weekly - 2 sets - 10 reps   ASSESSMENT:   CLINICAL IMPRESSION: Pt arrives to session reporting continued elevated pain in the neck and shoulder reated at a 6-7/10. Educated and consent was provided for TPDN focusing on the L upper trap combined with E-stim followed with STW and cervical / first rib mobs. Continued working on posterior chain activation which she did very well with. Updated HEP today to include seated/ supine chin tucks. End of session she reported pain dropped to a 3/10.    OBJECTIVE IMPAIRMENTS: decreased activity tolerance, decreased endurance, decreased mobility, decreased ROM, decreased strength, hypomobility, impaired UE functional use, postural dysfunction, and pain.    ACTIVITY LIMITATIONS: carrying, lifting, bending, squatting, sleeping, dressing, and reach over head   PARTICIPATION LIMITATIONS: cleaning, laundry, driving, community activity, and occupation   PERSONAL FACTORS: 3+ comorbidities: GERD, migraines, HTN, L toe amputation, hx CVA, CKD2, CHF/CAD, PVD, OSA  are also affecting patient's functional outcome.    REHAB POTENTIAL: Good   CLINICAL DECISION MAKING: Stable/uncomplicated   EVALUATION COMPLEXITY: Low     GOALS: Goals reviewed with patient? No   SHORT TERM GOALS: Target date: 06/03/2022 Pt will demonstrate appropriate understanding and performance of initially prescribed HEP in order to facilitate improved independence with management of symptoms.  Baseline: HEP provided on eval Goal status: INITIAL    2. Pt will score less than or equal to 31% on Quick DASH in order to demonstrate improved perception of function due to symptoms.            Baseline: 40.9%            Goal status: INITIAL    LONG TERM GOALS: Target date: 06/24/2022 Pt will less than or equal to 20% on NDI in order to demonstrate improved perception of function due to symptoms ([place] 10-13 pts per Corwin Levins al 2009, 2010). Baseline:  32% Goal status: INITIAL  2. Pt will score less than or equal to 25% on Quick DASH in order to indicate reduced levels of disability due to shoulder pain ([facility] 16-20pts).            Baseline: 40.9%            Goal status: INITIAL   3.  Pt will demonstrate at least 160 degrees of L active shoulder elevation in order to demonstrate improved tolerance to functional movement patterns such as cleaning and reaching overhead.  Baseline: see ROM chart above Goal status: INITIAL     4. Pt will report/demonstrate ability to push/pull up to 50# with less than 2 point increase in pain on NPS in order to indicate improved tolerance to work activities.             Baseline: Pt reports up to 9/10 pain on NPS with work activities such as pushing/pulling cleaning cart            Goal status: INITIAL    5. Pt will demonstrate at least 50 deg of cervical rotation bilaterally with less than 3/10 pain on NPS in order to facilitate improved environmental awareness and safety while driving.             Baseline: see ROM chart above            Goal status: INITIAL      PLAN:   PT FREQUENCY: 2x/week   PT DURATION: 6 weeks   PLANNED INTERVENTIONS: Therapeutic exercises, Therapeutic activity, Neuromuscular re-education, Balance training, Gait training, Patient/Family education, Self Care, Joint mobilization, Vestibular training, Aquatic Therapy, Dry Needling, Spinal mobilization, Cryotherapy, Moist heat, Taping, Manual therapy, and Re-evaluation   PLAN FOR NEXT SESSION: Progress ROM/strengthening exercises as able/appropriate, review HEP. response to TPDN, continue PRN.    Starr Lake, PT Michaelene Dutan PT, DPT, LAT, ATC  05/17/22  2:18 PM

## 2022-05-24 ENCOUNTER — Ambulatory Visit: Payer: Medicare Other | Admitting: Physical Therapy

## 2022-05-24 ENCOUNTER — Encounter: Payer: Self-pay | Admitting: Physical Therapy

## 2022-05-24 DIAGNOSIS — M542 Cervicalgia: Secondary | ICD-10-CM

## 2022-05-24 DIAGNOSIS — G4733 Obstructive sleep apnea (adult) (pediatric): Secondary | ICD-10-CM | POA: Diagnosis not present

## 2022-05-24 DIAGNOSIS — I10 Essential (primary) hypertension: Secondary | ICD-10-CM | POA: Diagnosis not present

## 2022-05-24 DIAGNOSIS — G8929 Other chronic pain: Secondary | ICD-10-CM | POA: Diagnosis not present

## 2022-05-24 DIAGNOSIS — R293 Abnormal posture: Secondary | ICD-10-CM

## 2022-05-24 DIAGNOSIS — M25512 Pain in left shoulder: Secondary | ICD-10-CM | POA: Diagnosis not present

## 2022-05-24 DIAGNOSIS — M6281 Muscle weakness (generalized): Secondary | ICD-10-CM | POA: Diagnosis not present

## 2022-05-24 NOTE — Therapy (Signed)
OUTPATIENT PHYSICAL THERAPY TREATMENT NOTE   Patient Name: Teresa [name] MRN: [id number] DOB:10-30-1960, 61 y.o., female Today's Date: 05/24/2022  PCP: Kinnie Feil, MD  REFERRING PROVIDER: Leandrew Koyanagi, MD   END OF SESSION:   PT End of Session - 05/24/22 0923     Visit Number 3    Number of Visits 13    Date for PT Re-Evaluation 07/08/22    Authorization Type UHC Medicare/medicaid    Progress Note Due on Visit 10    PT Start Time 0922    PT Stop Time 1007    PT Time Calculation (min) 45 min    Activity Tolerance Patient tolerated treatment well;Patient limited by pain    Behavior During Therapy [facility] for tasks assessed/performed              Past Medical History:  Diagnosis Date   Allergy    Anemia    Cervical disc disorder with radiculopathy of cervical region 02/10/2016   Chronic lower back pain    Claudication in peripheral vascular disease ([place]) 06/12/2020   Diverticulosis    Ear discomfort 03/11/2016   EKG, abnormal 01/06/2017   Gastritis    GERD (gastroesophageal reflux disease)    H/O hiatal hernia    Hiatal hernia    History of amputation of lesser toe of left foot ([place]) 03/11/2015   History of colon cancer    Hypertension    Hyperthyroidism    hx   Hypothyroidism    Lateral knee pain, left 08/06/2013   Left shoulder pain 05/28/2015   Migraine headache    "a few times/yr" (03/05/2015)   Neck pain 02/11/2014   Neck strain 02/06/2012   Numbness and tingling in right hand 05/16/2018   Osteomyelitis ([place])    [name] 03/05/2015   Osteomyelitis of toe of left foot ([place]) 03/05/2015   Palpitations 01/05/2016   Positive TB test    Sleep apnea    Snoring 02/15/2021   Stroke ([place]) 08/2017   Throat pain 09/21/2011   Normal CT and US neck march 2013, repeat Neck US in 1 year to follow-up small nodules.  Head CT 2013: normal ? glosspharyngeal neuralgia.  On amitriptyline await [facility] ENt referral.    Tubular adenoma of colon    Tubular adenoma of colon     Ulnar neuropathy at elbow of right upper extremity 06/12/2018   Past Surgical History:  Procedure Laterality Date   ABDOMINAL HYSTERECTOMY  2006   AMPUTATION TOE Left 03/05/2015   Procedure: AMPUTATION LEFT 5TH  TOE;  Surgeon: Wylene Simmer, MD;  Location: [facility];  Service: Orthopedics;  Laterality: Left;   APPENDECTOMY  2006   BREAST BIOPSY Left 11/26/2014   CESAREAN SECTION  1979 1986 1990   CHOLECYSTECTOMY     COLONOSCOPY     GALLBLADDER SURGERY     LOOP RECORDER INSERTION N/A 09/18/2017   Procedure: LOOP RECORDER INSERTION;  Surgeon: Evans Lance, MD;  Location: [place] [facility];  Service: Cardiovascular;  Laterality: N/A;   OPEN REDUCTION INTERNAL FIXATION (ORIF) DISTAL RADIAL FRACTURE Right 09/14/2019   Procedure: OPEN REDUCTION INTERNAL FIXATION (ORIF)  RADIAL AND ULNA FRACTURE;  Surgeon: Iran Planas, MD;  Location: [place];  Service: Orthopedics;  Laterality: Right;   ORIF ULNAR FRACTURE Right 09/14/2019   TEE WITHOUT CARDIOVERSION N/A 09/18/2017   Procedure: TRANSESOPHAGEAL ECHOCARDIOGRAM (TEE);  Surgeon: Pixie Casino, MD;  Location: [facility] END[facility];  Service: Cardiovascular;  Laterality: N/A;   TOE AMPUTATION Left 03/05/2015  5th toe   TUBAL LIGATION Bilateral 1990   UPPER GASTROINTESTINAL ENDOSCOPY     Patient Active Problem List   Diagnosis Date Noted   Eustachian tube dysfunction, left 05/03/2022   Vitreous flashes of both eyes 11/26/2021   Obstructive sleep apnea 07/05/2021   Bertolotti's syndrome 05/26/2021   Cervical radiculopathy 05/11/2021   Fatigue 05/11/2021   CAD in native artery 01/12/2021   Obese 01/11/2021   Peripheral vascular disease ([place]) 06/12/2020   History of forearm fracture 05/01/2020   History of hiatal hernia 05/01/2020   CKD (chronic kidney disease), stage II 01/15/2020   Tubular adenoma of colon 09/14/2019   Chronic diastolic CHF (congestive heart failure) ([place]) 09/14/2019   COVID-19 07/02/2019   Ulnar neuropathy at elbow of right  upper extremity 06/12/2018   Migraine 01/12/2018   Hyperlipidemia    History of CVA (cerebrovascular accident) 09/15/2017   History of cholecystectomy 06/27/2017   Small intestinal bacterial overgrowth 03/23/2017   Paresthesia 01/06/2017   Prediabetes 10/29/2015   Tendinopathy of left rotator cuff 10/27/2015   History of amputation of lesser toe, left ([place]) 03/11/2015   Resistant hypertension    Thyroid nodule 02/12/2013   GERD (gastroesophageal reflux disease) 02/06/2012   History of tobacco abuse 09/21/2011   Hypertension 09/21/2011   Hypothyroidism 09/21/2011    REFERRING DIAG: M25.512,G89.29 (ICD-10-CM) - Chronic left shoulder pain M54.2 (ICD-10-CM) - Cervicalgia  THERAPY DIAG:  Cervicalgia  Left shoulder pain, unspecified chronicity  Abnormal posture  Rationale for Evaluation and Treatment Rehabilitation  PERTINENT HISTORY: GERD, migraines, HTN, L toe amputation, hx CVA, CKD2, CHF/CAD, OSA   PRECAUTIONS: standard  SUBJECTIVE:                                                                                                                                                                                      SUBJECTIVE STATEMENT:   " I was dong pretty good after the last session, I have been doing the exercises which helped some. I did returned to work yesterday and I noticed having more soreness."   PAIN:  Are you having pain? Yes: NPRS scale: 5/10 Pain location: L shoulder/ upper trap Pain description: aching/ sore Aggravating factors: unsure,  Relieving factors: falling asleep   OBJECTIVE: (objective measures completed at initial evaluation unless otherwise dated)   DIAGNOSTIC FINDINGS:  Grossly unremarkable cervical/GH X rays per chart review - downward sloping acromion and mild degenerative changes AC joint, straightening of cervical spine but no acute issues per result narratives   PATIENT SURVEYS:  QUICK DASH: 40.9% NDI: 32%   COGNITION: Overall  cognitive status: Within functional limits for tasks assessed   SENSATION/NEURO: Light touch  intact B UE aside from mildly diminished C4 on L No other neuro complaints   POSTURE: forward head, B elevated UT more prominent on R, rounded shoulders   PALPATION: Concordant tenderness with trigger points L UT, LS. Mild tenderness paracervical musculature and rhomboid, all of above improves with brief STM and trigger point release Hypomobility/tenderness mid cervical spine          CERVICAL ROM:    Active ROM A/PROM (deg) eval  Flexion 50%p!  Extension 75%  Right lateral flexion 50% relieving stretch  Left lateral flexion 75%  Right rotation 42  Left rotation 25   (Blank rows = not tested) Comments: flexion more painful than extension UPPER EXTREMITY ROM:   Active ROM Right eval Left eval  Shoulder flexion 170 140  Shoulder abduction      Shoulder internal rotation      Shoulder external rotation      Elbow flexion      Elbow extension      Wrist flexion      Wrist extension       (Blank rows = not tested) Comments: deferred further testing given symptom irritability   UPPER EXTREMITY MMT:   MMT Right eval Left eval  Shoulder flexion      Shoulder extension      Shoulder abduction      Shoulder extension      Shoulder internal rotation      Shoulder external rotation      Elbow flexion      Elbow extension      Grip strength       (Blank rows = not tested)    Comments: deferred on eval given symptom irritability   CERVICAL SPECIAL TESTS:  Deferred on this date, will assess as able/appropriate   FUNCTIONAL TESTS:  Functional reach overhead limited and painful, increased compensatory UT elevation   TODAY'S TREATMENT:                                                                                                                    [facility] Adult PT Treatment:                                                DATE: 05/24/2022 Therapeutic Exercise: UBE L1 x 68mn  (FWD/BWD x 2:30) Upper trap/ levator scapulae stretch 2 x 30 sec L only Seated chin tuck 1 x 10 holding 10 sec hold Seated Scapular retraction with ER 2 x 12 with RTB Seated Scaption 2 x 12 bil UE unweighted focusing on controlled loweirng and reducing upper trap activation with tactile cues.  Seated lower trap strengthening with elbows propped on bolster 2 x 12 with RTB  Updated HEP today for scaption, money exercise and self rib mobs Manual Therapy: Self-trigger point release using theracane and educating benefits and technique Self - first rib mobs with  sheet focusing on the L combined with inhalation and exhaling to maximize MWM.   [facility] Adult PT Treatment:                                                DATE: 05/17/2022 Therapeutic Exercise: Scapular retraction 2 x 10 holding 5 seconds Supine Chin tuck in supine 2 x 10 holding 2 seconds Upper trap stretch 2 x 30  Supine scapular retraction with ER 2 x 10 with GTB. Manual Therapy: IASTM along the L upper trap Cervical mobs PA and L Gapping C3-C6 grade III L first rib mobs  Manual cervical traction   Trigger Point Dry-Needling  Treatment instructions: Expect mild to moderate muscle soreness. S/S of pneumothorax if dry needled over a lung field, and to seek immediate medical attention should they occur. Patient verbalized understanding of these instructions and education.  Patient Consent Given: Yes Education handout provided: Yes Muscles treated: L upper trap Electrical stimulation performed: Yes Parameters:  CPS Lvl 25, x 8 min increased to tolerance Treatment response/outcome: twitch response and reduction of tension     [facility] Adult PT Treatment:                                      DATE: 05/13/22 Therapeutic Exercise: Scap retraction x10 cues for form and HEP Sidebending stretch to R only 2x30sec cues for form and HEP Manual Therapy: Brief STM L LS, UT, rhomboid with report of good relief   PATIENT EDUCATION:   Education details: Pt education on PT impairments, prognosis, and POC. Informed consent. Rationale for interventions, safe/appropriate HEP performance Person educated: Patient Education method: Explanation, Demonstration, Tactile cues, Verbal cues, and Handouts Education comprehension: verbalized understanding, returned demonstration, verbal cues required, tactile cues required, and needs further education     HOME EXERCISE PROGRAM: Access Code: [email or web address] URL: [email or web address] Date: 05/24/2022 Prepared by: Starr Lake  Exercises - Seated Scapular Retraction  - 1 x daily - 7 x weekly - 3 sets - 10 reps - Seated Cervical Sidebending Stretch  - 1 x daily - 7 x weekly - 1 sets - 3 reps - 30sec hold - Supine Chin Tuck  - 1 x daily - 7 x weekly - 2 sets - 10 reps - Standing Cervical Retraction  - 1 x daily - 7 x weekly - 2 sets - 10 reps - Scapular retraction with ER (MONEY)  - 1 x daily - 7 x weekly - 2 sets - 10 reps - Seated Shoulder Scaption  - 1 x daily - 7 x weekly - 2 sets - 10 reps - First Rib Mobilization with Strap  - 1 x daily - 2 sets - 10 reps - 5 hold ASSESSMENT:   CLINICAL IMPRESSION: Pt arrives to session noting she was doing better since the last session until returning to work yesterday. She does reprot pain at 5/10 today which is lower than the last session. Focused session on scapular stability to promote scapulohumeral rhythm and reduce upper trap activation. Pt did great with all exercises and she reported end of sessio pain dropped to a 3/10 at end of session.    OBJECTIVE IMPAIRMENTS: decreased activity tolerance, decreased endurance, decreased mobility, decreased ROM, decreased strength, hypomobility, impaired UE functional use, postural dysfunction, and pain.  ACTIVITY LIMITATIONS: carrying, lifting, bending, squatting, sleeping, dressing, and reach over head   PARTICIPATION LIMITATIONS: cleaning, laundry, driving, community activity, and  occupation   PERSONAL FACTORS: 3+ comorbidities: GERD, migraines, HTN, L toe amputation, hx CVA, CKD2, CHF/CAD, PVD, OSA  are also affecting patient's functional outcome.    REHAB POTENTIAL: Good   CLINICAL DECISION MAKING: Stable/uncomplicated   EVALUATION COMPLEXITY: Low     GOALS: Goals reviewed with patient? No   SHORT TERM GOALS: Target date: 06/03/2022 Pt will demonstrate appropriate understanding and performance of initially prescribed HEP in order to facilitate improved independence with management of symptoms.  Baseline: HEP provided on eval Goal status: INITIAL    2. Pt will score less than or equal to 31% on Quick DASH in order to demonstrate improved perception of function due to symptoms.            Baseline: 40.9%            Goal status: INITIAL    LONG TERM GOALS: Target date: 06/24/2022 Pt will less than or equal to 20% on NDI in order to demonstrate improved perception of function due to symptoms ([place] 10-13 pts per Corwin Levins al 2009, 2010). Baseline: 32% Goal status: INITIAL   2. Pt will score less than or equal to 25% on Quick DASH in order to indicate reduced levels of disability due to shoulder pain (MDC 16-20pts).            Baseline: 40.9%            Goal status: INITIAL   3.  Pt will demonstrate at least 160 degrees of L active shoulder elevation in order to demonstrate improved tolerance to functional movement patterns such as cleaning and reaching overhead.  Baseline: see ROM chart above Goal status: INITIAL     4. Pt will report/demonstrate ability to push/pull up to 50# with less than 2 point increase in pain on NPS in order to indicate improved tolerance to work activities.             Baseline: Pt reports up to 9/10 pain on NPS with work activities such as pushing/pulling cleaning cart            Goal status: INITIAL    5. Pt will demonstrate at least 50 deg of cervical rotation bilaterally with less than 3/10 pain on NPS in order to facilitate  improved environmental awareness and safety while driving.             Baseline: see ROM chart above            Goal status: INITIAL      PLAN:   PT FREQUENCY: 2x/week   PT DURATION: 6 weeks   PLANNED INTERVENTIONS: Therapeutic exercises, Therapeutic activity, Neuromuscular re-education, Balance training, Gait training, Patient/Family education, Self Care, Joint mobilization, Vestibular training, Aquatic Therapy, Dry Needling, Spinal mobilization, Cryotherapy, Moist heat, Taping, Manual therapy, and Re-evaluation   PLAN FOR NEXT SESSION: Progress ROM/strengthening exercises as able/appropriate, review HEP. TPDN PRN, scapular stability strengthening, posture education.    Starr Lake, PT Teresa [name] PT, DPT, LAT, ATC  05/24/22  10:09 AM

## 2022-05-25 DIAGNOSIS — H16223 Keratoconjunctivitis sicca, not specified as Sjogren's, bilateral: Secondary | ICD-10-CM | POA: Diagnosis not present

## 2022-05-25 DIAGNOSIS — H02402 Unspecified ptosis of left eyelid: Secondary | ICD-10-CM | POA: Diagnosis not present

## 2022-05-26 ENCOUNTER — Ambulatory Visit: Payer: Medicare Other | Admitting: Physical Therapy

## 2022-05-26 ENCOUNTER — Encounter: Payer: Self-pay | Admitting: Physical Therapy

## 2022-05-26 DIAGNOSIS — M542 Cervicalgia: Secondary | ICD-10-CM | POA: Diagnosis not present

## 2022-05-26 DIAGNOSIS — G8929 Other chronic pain: Secondary | ICD-10-CM | POA: Diagnosis not present

## 2022-05-26 DIAGNOSIS — M25512 Pain in left shoulder: Secondary | ICD-10-CM

## 2022-05-26 DIAGNOSIS — R293 Abnormal posture: Secondary | ICD-10-CM | POA: Diagnosis not present

## 2022-05-26 DIAGNOSIS — M6281 Muscle weakness (generalized): Secondary | ICD-10-CM | POA: Diagnosis not present

## 2022-05-26 NOTE — Therapy (Addendum)
OUTPATIENT PHYSICAL THERAPY TREATMENT NOTE + NO VISIT DISCHARGE   Patient Name: Teresa [name] MRN: [id number] DOB:05-18-1961, 61 y.o., female Today's Date: 05/26/2022  PCP: Kinnie Feil, MD  REFERRING PROVIDER: Leandrew Koyanagi, MD   END OF SESSION:   PT End of Session - 05/26/22 0931     Visit Number 4    Number of Visits 13    Date for PT Re-Evaluation 07/08/22    Authorization Type UHC Medicare/medicaid    Progress Note Due on Visit 10    PT Start Time 0931    PT Stop Time 1011    PT Time Calculation (min) 40 min    Activity Tolerance Patient tolerated treatment well;No increased pain    Behavior During Therapy [facility] for tasks assessed/performed               Past Medical History:  Diagnosis Date   Allergy    Anemia    Cervical disc disorder with radiculopathy of cervical region 02/10/2016   Chronic lower back pain    Claudication in peripheral vascular disease ([place]) 06/12/2020   Diverticulosis    Ear discomfort 03/11/2016   EKG, abnormal 01/06/2017   Gastritis    GERD (gastroesophageal reflux disease)    H/O hiatal hernia    Hiatal hernia    History of amputation of lesser toe of left foot ([place]) 03/11/2015   History of colon cancer    Hypertension    Hyperthyroidism    hx   Hypothyroidism    Lateral knee pain, left 08/06/2013   Left shoulder pain 05/28/2015   Migraine headache    "a few times/yr" (03/05/2015)   Neck pain 02/11/2014   Neck strain 02/06/2012   Numbness and tingling in right hand 05/16/2018   Osteomyelitis ([place])    [name] 03/05/2015   Osteomyelitis of toe of left foot ([place]) 03/05/2015   Palpitations 01/05/2016   Positive TB test    Sleep apnea    Snoring 02/15/2021   Stroke ([place]) 08/2017   Throat pain 09/21/2011   Normal CT and US neck march 2013, repeat Neck US in 1 year to follow-up small nodules.  Head CT 2013: normal ? glosspharyngeal neuralgia.  On amitriptyline await [facility] ENt referral.    Tubular adenoma of colon    Tubular  adenoma of colon    Ulnar neuropathy at elbow of right upper extremity 06/12/2018   Past Surgical History:  Procedure Laterality Date   ABDOMINAL HYSTERECTOMY  2006   AMPUTATION TOE Left 03/05/2015   Procedure: AMPUTATION LEFT 5TH  TOE;  Surgeon: Wylene Simmer, MD;  Location: [place];  Service: Orthopedics;  Laterality: Left;   APPENDECTOMY  2006   BREAST BIOPSY Left 11/26/2014   CESAREAN SECTION  1979 1986 1990   CHOLECYSTECTOMY     COLONOSCOPY     GALLBLADDER SURGERY     LOOP RECORDER INSERTION N/A 09/18/2017   Procedure: LOOP RECORDER INSERTION;  Surgeon: Evans Lance, MD;  Location: [place] [facility];  Service: Cardiovascular;  Laterality: N/A;   OPEN REDUCTION INTERNAL FIXATION (ORIF) DISTAL RADIAL FRACTURE Right 09/14/2019   Procedure: OPEN REDUCTION INTERNAL FIXATION (ORIF)  RADIAL AND ULNA FRACTURE;  Surgeon: Iran Planas, MD;  Location: [place];  Service: Orthopedics;  Laterality: Right;   ORIF ULNAR FRACTURE Right 09/14/2019   TEE WITHOUT CARDIOVERSION N/A 09/18/2017   Procedure: TRANSESOPHAGEAL ECHOCARDIOGRAM (TEE);  Surgeon: Pixie Casino, MD;  Location: [place];  Service: Cardiovascular;  Laterality: N/A;   TOE  AMPUTATION Left 03/05/2015   5th toe   TUBAL LIGATION Bilateral 1990   UPPER GASTROINTESTINAL ENDOSCOPY     Patient Active Problem List   Diagnosis Date Noted   Eustachian tube dysfunction, left 05/03/2022   Vitreous flashes of both eyes 11/26/2021   Obstructive sleep apnea 07/05/2021   Bertolotti's syndrome 05/26/2021   Cervical radiculopathy 05/11/2021   Fatigue 05/11/2021   CAD in native artery 01/12/2021   Obese 01/11/2021   Peripheral vascular disease ([place]) 06/12/2020   History of forearm fracture 05/01/2020   History of hiatal hernia 05/01/2020   CKD (chronic kidney disease), stage II 01/15/2020   Tubular adenoma of colon 09/14/2019   Chronic diastolic CHF (congestive heart failure) ([place]) 09/14/2019   COVID-19 07/02/2019   Ulnar neuropathy  at elbow of right upper extremity 06/12/2018   Migraine 01/12/2018   Hyperlipidemia    History of CVA (cerebrovascular accident) 09/15/2017   History of cholecystectomy 06/27/2017   Small intestinal bacterial overgrowth 03/23/2017   Paresthesia 01/06/2017   Prediabetes 10/29/2015   Tendinopathy of left rotator cuff 10/27/2015   History of amputation of lesser toe, left ([place]) 03/11/2015   Resistant hypertension    Thyroid nodule 02/12/2013   GERD (gastroesophageal reflux disease) 02/06/2012   History of tobacco abuse 09/21/2011   Hypertension 09/21/2011   Hypothyroidism 09/21/2011    REFERRING DIAG: M25.512,G89.29 (ICD-10-CM) - Chronic left shoulder pain M54.2 (ICD-10-CM) - Cervicalgia  THERAPY DIAG:  Cervicalgia  Left shoulder pain, unspecified chronicity  Abnormal posture  Muscle weakness (generalized)  Rationale for Evaluation and Treatment Rehabilitation  PERTINENT HISTORY: GERD, migraines, HTN, L toe amputation, hx CVA, CKD2, CHF/CAD, OSA   PRECAUTIONS: standard  SUBJECTIVE:                                                                                                                                                                                      SUBJECTIVE STATEMENT:   Pt arrives with report of improving symptoms overall, continues to fluctuate. Denies issues after last session, good compliance w/ HEP reported. Pt notes that she feels dry needling had been helpful and she'd like to try it again at some point.   PAIN:  Are you having pain? Yes: NPRS scale: 3/10 Pain location: L shoulder/ upper trap Pain description: aching/ sore Aggravating factors: unsure,  Relieving factors: falling asleep   OBJECTIVE: (objective measures completed at initial evaluation unless otherwise dated)   DIAGNOSTIC FINDINGS:  Grossly unremarkable cervical/GH X rays per chart review - downward sloping acromion and mild degenerative changes AC joint, straightening of cervical  spine but no acute issues per result narratives   PATIENT SURVEYS:  QUICK DASH: 40.9%  NDI: 32%   COGNITION: Overall cognitive status: Within functional limits for tasks assessed   SENSATION/NEURO: Light touch intact B UE aside from mildly diminished C4 on L No other neuro complaints   POSTURE: forward head, B elevated UT more prominent on R, rounded shoulders   PALPATION: Concordant tenderness with trigger points L UT, LS. Mild tenderness paracervical musculature and rhomboid, all of above improves with brief STM and trigger point release Hypomobility/tenderness mid cervical spine          CERVICAL ROM:    Active ROM A/PROM (deg) eval  Flexion 50%p!  Extension 75%  Right lateral flexion 50% relieving stretch  Left lateral flexion 75%  Right rotation 42  Left rotation 25   (Blank rows = not tested) Comments: flexion more painful than extension UPPER EXTREMITY ROM:   Active ROM Right eval Left eval  Shoulder flexion 170 140  Shoulder abduction      Shoulder internal rotation      Shoulder external rotation      Elbow flexion      Elbow extension      Wrist flexion      Wrist extension       (Blank rows = not tested) Comments: deferred further testing given symptom irritability   UPPER EXTREMITY MMT:   MMT Right eval Left eval  Shoulder flexion      Shoulder extension      Shoulder abduction      Shoulder extension      Shoulder internal rotation      Shoulder external rotation      Elbow flexion      Elbow extension      Grip strength       (Blank rows = not tested)    Comments: deferred on eval given symptom irritability   CERVICAL SPECIAL TESTS:  Deferred on this date, will assess as able/appropriate   FUNCTIONAL TESTS:  Functional reach overhead limited and painful, increased compensatory UT elevation   TODAY'S TREATMENT:                                                                          [facility] Adult PT Treatment:                                                 DATE: 05/26/22 Therapeutic Exercise: UBE L1 x5 min (fwd/bwd x2:30 each) Upper trap/levator scap stretch 2x30sec B cues for breath control Chin tuck ball at [place] 3x10 cues for form and pacing Seated scaption B with RTB around wrist 2x10, cues for appropriate tension and scapular mechanics RTB W (ER + scap retraction) 3x8, standing Shoulder extension RTB 3x8 cues for form and posture  Shoulder shrugs 2x12 cues for form and breath control, emphasis on eccentric portion and scap depression   [facility] Adult PT Treatment:  DATE: 05/24/2022 Therapeutic Exercise: UBE L1 x 79mn (FWD/BWD x 2:30) Upper trap/ levator scapulae stretch 2 x 30 sec L only Seated chin tuck 1 x 10 holding 10 sec hold Seated Scapular retraction with ER 2 x 12 with RTB Seated Scaption 2 x 12 bil UE unweighted focusing on controlled loweirng and reducing upper trap activation with tactile cues.  Seated lower trap strengthening with elbows propped on bolster 2 x 12 with RTB  Updated HEP today for scaption, money exercise and self rib mobs Manual Therapy: Self-trigger point release using theracane and educating benefits and technique Self - first rib mobs with sheet focusing on the L combined with inhalation and exhaling to maximize MWM.   [facility]                                                [facility]: [facility] Therapeutic Exercise: Scapular retraction 2 x 10 holding 5 seconds Supine Chin tuck in supine 2 x 10 holding 2 seconds Upper trap stretch 2 x 30  Supine scapular retraction with ER 2 x 10 with GTB. Manual Therapy: IASTM along the L upper trap Cervical mobs PA and L Gapping C3-C6 grade III L first rib mobs  Manual cervical traction   Trigger Point Dry-Needling  Treatment instructions: Expect mild to moderate muscle soreness. S/S of pneumothorax if dry needled over a lung field, and to seek immediate medical attention should they  occur. Patient verbalized understanding of these instructions and education.  Patient Consent Given: Yes Education handout provided: Yes Muscles treated: L upper trap Electrical stimulation performed: Yes Parameters:  CPS Lvl 25, x 8 min increased to tolerance Treatment response/outcome: twitch response and reduction of tension    PATIENT EDUCATION:  Education details: rationale for interventions Person educated: Patient Education method: Explanation, Demonstration, Tactile cues, Verbal cues, and Handouts Education comprehension: verbalized understanding, returned demonstration, verbal cues required, tactile cues required, and needs further education     HOME EXERCISE PROGRAM: Access Code: [email or web address] URL: [email or web address] Date: 05/24/2022 Prepared by: [place] Exercises - Seated Scapular Retraction  - 1 x daily - 7 x weekly - 3 sets - 10 reps - Seated Cervical Sidebending Stretch  - 1 x daily - 7 x weekly - 1 sets - 3 reps - 30sec hold - Supine Chin Tuck  - 1 x daily - 7 x weekly - 2 sets - 10 reps - Standing Cervical Retraction  - 1 x daily - 7 x weekly - 2 sets - 10 reps - Scapular retraction with ER (MONEY)  - 1 x daily - 7 x weekly - 2 sets - 10 reps - Seated Shoulder Scaption  - 1 x daily - 7 x weekly - 2 sets - 10 reps - First Rib Mobilization with Strap  - 1 x daily - 2 sets - 10 reps - 5 hold ASSESSMENT:   CLINICAL IMPRESSION: Pt arrives with report of continued improvement in pain although continues to fluctuate, 3/10 at present. Session initiated with exercises emphasizing tissue extensibility, pt tolerates well. Able to progress for increased resistance/complexity with exercises targeting periscapular/GH musculature. Intermittent cues required for form and reduced compensations as above. Pt denies any increase in pain throughout session, no adverse events, tolerates well. Pt departs today's session in no acute distress, all voiced  questions/concerns addressed appropriately from PT perspective.  OBJECTIVE IMPAIRMENTS: decreased activity tolerance, decreased endurance, decreased mobility, decreased ROM, decreased strength, hypomobility, impaired UE functional use, postural dysfunction, and pain.    ACTIVITY LIMITATIONS: carrying, lifting, bending, squatting, sleeping, dressing, and reach over head   PARTICIPATION LIMITATIONS: cleaning, laundry, driving, community activity, and occupation   PERSONAL FACTORS: 3+ comorbidities: GERD, migraines, HTN, L toe amputation, hx CVA, CKD2, CHF/CAD, PVD, OSA  are also affecting patient's functional outcome.    REHAB POTENTIAL: Good   CLINICAL DECISION MAKING: Stable/uncomplicated   EVALUATION COMPLEXITY: Low     GOALS: Goals reviewed with patient? No   SHORT TERM GOALS: Target date: 06/03/2022 Pt will demonstrate appropriate understanding and performance of initially prescribed HEP in order to facilitate improved independence with management of symptoms.  Baseline: HEP provided on eval Goal status: INITIAL    2. Pt will score less than or equal to 31% on Quick DASH in order to demonstrate improved perception of function due to symptoms.            Baseline: 40.9%            Goal status: INITIAL    LONG TERM GOALS: Target date: 06/24/2022 Pt will less than or equal to 20% on NDI in order to demonstrate improved perception of function due to symptoms ([place] 10-13 pts per Corwin Levins al 2009, 2010). Baseline: 32% Goal status: INITIAL   2. Pt will score less than or equal to 25% on Quick DASH in order to indicate reduced levels of disability due to shoulder pain ([facility] 16-20pts).            Baseline: 40.9%            Goal status: INITIAL   3.  Pt will demonstrate at least 160 degrees of L active shoulder elevation in order to demonstrate improved tolerance to functional movement patterns such as cleaning and reaching overhead.  Baseline: see ROM chart above Goal status:  INITIAL     4. Pt will report/demonstrate ability to push/pull up to 50# with less than 2 point increase in pain on NPS in order to indicate improved tolerance to work activities.             Baseline: Pt reports up to 9/10 pain on NPS with work activities such as pushing/pulling cleaning cart            Goal status: INITIAL    5. Pt will demonstrate at least 50 deg of cervical rotation bilaterally with less than 3/10 pain on NPS in order to facilitate improved environmental awareness and safety while driving.             Baseline: see ROM chart above            Goal status: INITIAL      PLAN:   PT FREQUENCY: 2x/week   PT DURATION: 6 weeks   PLANNED INTERVENTIONS: Therapeutic exercises, Therapeutic activity, Neuromuscular re-education, Balance training, Gait training, Patient/Family education, Self Care, Joint mobilization, Vestibular training, Aquatic Therapy, Dry Needling, Spinal mobilization, Cryotherapy, Moist heat, Taping, Manual therapy, and Re-evaluation   PLAN FOR NEXT SESSION:  Progress ROM/strengthening exercises as able/appropriate, review HEP. TPDN PRN, scapular stability strengthening, posture education.   Leeroy Cha PT, DPT 05/26/2022 10:20 AM     PHYSICAL THERAPY DISCHARGE SUMMARY  Visits from Start of Care: 4  Current functional level related to goals / functional outcomes: Unable to assess, pt not seen in clinic since 05/26/22   Remaining deficits: Unable to assess -  see above for most recent deficits   Education / Equipment: Unable to assess   Patient unable to agree to discharge due to lack of followup. Patient goals were  unable to be assessed . Patient is being discharged due to not returning since the last visit.  Leeroy Cha PT, DPT 07/07/2022 11:48 AM

## 2022-05-27 ENCOUNTER — Telehealth: Payer: Self-pay | Admitting: Orthopaedic Surgery

## 2022-05-27 NOTE — Telephone Encounter (Signed)
Patient returned call.  Appointment made.

## 2022-05-27 NOTE — Telephone Encounter (Signed)
Patient called. Says she is in a lot of pain. Would like to know what she can do? Her call back number is [phone]

## 2022-05-27 NOTE — Telephone Encounter (Signed)
She should make an appt if she wants

## 2022-05-31 ENCOUNTER — Ambulatory Visit: Payer: Medicare Other | Admitting: Physical Therapy

## 2022-05-31 ENCOUNTER — Ambulatory Visit (INDEPENDENT_AMBULATORY_CARE_PROVIDER_SITE_OTHER): Payer: Medicare Other | Admitting: Orthopaedic Surgery

## 2022-05-31 DIAGNOSIS — M5412 Radiculopathy, cervical region: Secondary | ICD-10-CM | POA: Diagnosis not present

## 2022-05-31 MED ORDER — PREDNISONE 10 MG (21) PO TBPK: ORAL_TABLET | ORAL | 0 refills | Status: DC

## 2022-05-31 MED ORDER — ACETAMINOPHEN-CODEINE 300-30 MG PO TABS: 1.0000 | ORAL_TABLET | Freq: Three times a day (TID) | ORAL | 2 refills | Status: DC | PRN

## 2022-05-31 NOTE — Progress Notes (Signed)
Office Visit Note   Patient: Teresa [name]           Date of Birth: 09/09/1960           MRN: [id number] Visit Date: 05/31/2022              Requested by: Kinnie Feil, MD [address] [place],  [place] [address] PCP: Kinnie Feil, MD   Assessment & Plan: Visit Diagnoses:  1. Cervical radiculopathy     Plan: Impression is left upper extremity cervical spine radiculopathy.  At this point, believe her symptoms are coming from her neck as she has referred pain down her arm and into the traps with cervical flexion.  I discussed starting her on a steroid taper.  We also discussed taking a break from physical therapy until her symptoms of calm down.  If her symptoms improve, she will restart therapy with dry needling and stretching, but avoid any resistance bands.  If her symptoms fail to improve at all, recommended repeat cervical spine MRI.  She does tell me she will need to be asleep for this.  She will call and let me know how she feels over the next few weeks.  Follow-Up Instructions: Return if symptoms worsen or fail to improve.   Orders:  No orders of the defined types were placed in this encounter.  Meds ordered this encounter  Medications   predniSONE (STERAPRED UNI-PAK 21 TAB) 10 MG (21) TBPK tablet    Sig: Take as directed    Dispense:  21 tablet    Refill:  0   acetaminophen-codeine (TYLENOL #3) 300-30 MG tablet    Sig: Take 1 tablet by mouth every 8 (eight) hours as needed for moderate pain.    Dispense:  30 tablet    Refill:  2      Procedures: No procedures performed   Clinical Data: No additional findings.   Subjective: Chief Complaint  Patient presents with   Left Shoulder - Pain    HPI patient is a pleasant 61 year old female who comes in today with recurrent left neck and shoulder pain.  She was seen in our office several weeks ago for pain to the left upper traps.  She was started in physical therapy which she was initially doing  well until she started working with resistance bands last week.  She woke up Friday morning with increased pain.  The pain she has is to the lateral left neck and radiates down at the parascapular region and into the left arm.  Pain down the arm occurs when she is bringing her left ear to her left shoulder.  She has been using muscle relaxers which help with a put her to sleep.  Otherwise not taking any medication for pain.  She has occasional paresthesias to right upper extremity.  She did undergo an MRI of the cervical spine several years ago which was unremarkable.  Review of Systems as detailed in HPI.  All others reviewed and are negative.   Objective: Vital Signs: There were no vitals taken for this visit.  Physical Exam well-developed well-nourished female in no acute distress.  Alert and oriented x3.  Ortho Exam left shoulder exam reveals pain in the parascapular region with forward flexion and abduction.  Cervical spine exam reveals no spinous tenderness.  She has moderate tenderness to the upper left traps.  Increased pain with left-sided rotation and cervical spine flexion.  No focal weakness.  She is neurovascular intact distally.  Specialty Comments:  No specialty comments available.  Imaging: No new imaging   PMFS History: Patient Active Problem List   Diagnosis Date Noted   Eustachian tube dysfunction, left 05/03/2022   Vitreous flashes of both eyes 11/26/2021   Obstructive sleep apnea 07/05/2021   Bertolotti's syndrome 05/26/2021   Cervical radiculopathy 05/11/2021   Fatigue 05/11/2021   CAD in native artery 01/12/2021   Obese 01/11/2021   Peripheral vascular disease ([place]) 06/12/2020   History of forearm fracture 05/01/2020   History of hiatal hernia 05/01/2020   CKD (chronic kidney disease), stage II 01/15/2020   Tubular adenoma of colon 09/14/2019   Chronic diastolic CHF (congestive heart failure) ([place]) 09/14/2019   COVID-19 07/02/2019   Ulnar neuropathy at  elbow of right upper extremity 06/12/2018   Migraine 01/12/2018   Hyperlipidemia    History of CVA (cerebrovascular accident) 09/15/2017   History of cholecystectomy 06/27/2017   Small intestinal bacterial overgrowth 03/23/2017   Paresthesia 01/06/2017   Prediabetes 10/29/2015   Tendinopathy of left rotator cuff 10/27/2015   History of amputation of lesser toe, left ([place]) 03/11/2015   Resistant hypertension    Thyroid nodule 02/12/2013   GERD (gastroesophageal reflux disease) 02/06/2012   History of tobacco abuse 09/21/2011   Hypertension 09/21/2011   Hypothyroidism 09/21/2011   Past Medical History:  Diagnosis Date   Allergy    Anemia    Cervical disc disorder with radiculopathy of cervical region 02/10/2016   Chronic lower back pain    Claudication in peripheral vascular disease ([place]) 06/12/2020   Diverticulosis    Ear discomfort 03/11/2016   EKG, abnormal 01/06/2017   Gastritis    GERD (gastroesophageal reflux disease)    H/O hiatal hernia    Hiatal hernia    History of amputation of lesser toe of left foot ([place]) 03/11/2015   History of colon cancer    Hypertension    Hyperthyroidism    hx   Hypothyroidism    Lateral knee pain, left 08/06/2013   Left shoulder pain 05/28/2015   Migraine headache    "a few times/yr" (03/05/2015)   Neck pain 02/11/2014   Neck strain 02/06/2012   Numbness and tingling in right hand 05/16/2018   Osteomyelitis ([place])    [name] 03/05/2015   Osteomyelitis of toe of left foot ([place]) 03/05/2015   Palpitations 01/05/2016   Positive TB test    Sleep apnea    Snoring 02/15/2021   Stroke ([place]) 08/2017   Throat pain 09/21/2011   Normal CT and US neck march 2013, repeat Neck US in 1 year to follow-up small nodules.  Head CT 2013: normal ? glosspharyngeal neuralgia.  On amitriptyline await [facility] ENt referral.    Tubular adenoma of colon    Tubular adenoma of colon    Ulnar neuropathy at elbow of right upper extremity 06/12/2018    Family History   Problem Relation Age of Onset   Heart disease Mother        CAB age 5   Diabetes Mother    Cancer Father 62       pancreatic cancer   Stroke Father    Pancreatic cancer Sister    Diabetes Sister    Kidney disease Sister        renal failure   Diabetes Brother    Kidney disease Brother        renal failure   Breast cancer Maternal Aunt        per pt aunts and uncles died  of cancer not sure the type   Breast cancer Paternal Aunt    Cancer Paternal Uncle    Cancer Sister    Esophageal cancer Neg Hx    Rectal cancer Neg Hx    Stomach cancer Neg Hx    Thyroid disease Neg Hx    Colon cancer Neg Hx    Colon polyps Neg Hx     Past Surgical History:  Procedure Laterality Date   ABDOMINAL HYSTERECTOMY  2006   AMPUTATION TOE Left 03/05/2015   Procedure: AMPUTATION LEFT 5TH  TOE;  Surgeon: Wylene Simmer, MD;  Location: [place];  Service: Orthopedics;  Laterality: Left;   APPENDECTOMY  2006   BREAST BIOPSY Left 11/26/2014   CESAREAN SECTION  1979 1986 1990   CHOLECYSTECTOMY     COLONOSCOPY     GALLBLADDER SURGERY     LOOP RECORDER INSERTION N/A 09/18/2017   Procedure: LOOP RECORDER INSERTION;  Surgeon: Evans Lance, MD;  Location: [place] [facility];  Service: Cardiovascular;  Laterality: N/A;   OPEN REDUCTION INTERNAL FIXATION (ORIF) DISTAL RADIAL FRACTURE Right 09/14/2019   Procedure: OPEN REDUCTION INTERNAL FIXATION (ORIF)  RADIAL AND ULNA FRACTURE;  Surgeon: Iran Planas, MD;  Location: [place];  Service: Orthopedics;  Laterality: Right;   ORIF ULNAR FRACTURE Right 09/14/2019   TEE WITHOUT CARDIOVERSION N/A 09/18/2017   Procedure: TRANSESOPHAGEAL ECHOCARDIOGRAM (TEE);  Surgeon: Pixie Casino, MD;  [facility];  Service: Cardiovascular;  Laterality: N/A;   TOE AMPUTATION Left 03/05/2015   5th toe   TUBAL LIGATION Bilateral 1990   UPPER GASTROINTESTINAL ENDOSCOPY     Social History   Occupational History   Occupation: cosmetolgist  Tobacco Use   Smoking  status: Former    Packs/day: 1.00    Years: 36.00    Total pack years: 36.00    Types: Cigarettes    Quit date: 09/23/2017    Years since quitting: 4.6   Smokeless tobacco: Never   Tobacco comments:    Tobacco info given 03/01/17  Vaping Use   Vaping Use: Never used  Substance and Sexual Activity   Alcohol use: Never   Drug use: Never   Sexual activity: Not Currently    Birth control/protection: Post-menopausal, Surgical

## 2022-06-01 ENCOUNTER — Encounter: Payer: Medicaid Other | Admitting: Physical Therapy

## 2022-06-03 ENCOUNTER — Ambulatory Visit (AMBULATORY_SURGERY_CENTER): Payer: Medicare Other | Admitting: Internal Medicine

## 2022-06-03 ENCOUNTER — Encounter: Payer: Self-pay | Admitting: Internal Medicine

## 2022-06-03 VITALS — BP 151/82 | HR 60 | Temp 96.9°F | Resp 16 | Ht 65.0 in | Wt 207.0 lb

## 2022-06-03 DIAGNOSIS — Z800 Family history of malignant neoplasm of digestive organs: Secondary | ICD-10-CM | POA: Diagnosis not present

## 2022-06-03 DIAGNOSIS — Z86010 Personal history of colonic polyps: Secondary | ICD-10-CM

## 2022-06-03 DIAGNOSIS — Z09 Encounter for follow-up examination after completed treatment for conditions other than malignant neoplasm: Secondary | ICD-10-CM | POA: Diagnosis not present

## 2022-06-03 MED ORDER — SODIUM CHLORIDE 0.9 % IV SOLN: 500.0000 mL | Freq: Once | INTRAVENOUS | Status: DC

## 2022-06-03 NOTE — Progress Notes (Signed)
Pt's states no medical or surgical changes since previsit or office visit. 

## 2022-06-03 NOTE — Progress Notes (Signed)
GASTROENTEROLOGY PROCEDURE H&P NOTE   Primary Care Physician: Kinnie Feil, MD    Reason for Procedure:  History of colon polyps and family history of colon cancer  Plan:    Colonoscopy  Patient is appropriate for endoscopic procedure(s) in the ambulatory ([facility]) setting.  The nature of the procedure, as well as the risks, benefits, and alternatives were carefully and thoroughly reviewed with the patient. Ample time for discussion and questions allowed. The patient understood, was satisfied, and agreed to proceed.     HPI: Teresa [name] is a 61 y.o. female who presents for colonoscopy.  Medical history as below.  Tolerated the prep.  No recent chest pain or shortness of breath.  No abdominal pain today.  Past Medical History:  Diagnosis Date   Allergy    Anemia    Cervical disc disorder with radiculopathy of cervical region 02/10/2016   Chronic lower back pain    Claudication in peripheral vascular disease ([place]) 06/12/2020   Diverticulosis    Ear discomfort 03/11/2016   EKG, abnormal 01/06/2017   Gastritis    GERD (gastroesophageal reflux disease)    H/O hiatal hernia    Hiatal hernia    History of amputation of lesser toe of left foot ([place]) 03/11/2015   History of colon cancer    Hypertension    Hyperthyroidism    hx   Hypothyroidism    Lateral knee pain, left 08/06/2013   Left shoulder pain 05/28/2015   Migraine headache    "a few times/yr" (03/05/2015)   Neck pain 02/11/2014   Neck strain 02/06/2012   Numbness and tingling in right hand 05/16/2018   Osteomyelitis ([place])    [name] 03/05/2015   Osteomyelitis of toe of left foot ([place]) 03/05/2015   Palpitations 01/05/2016   Positive TB test    Sleep apnea    Snoring 02/15/2021   Stroke ([place]) 08/2017   Throat pain 09/21/2011   Normal CT and US neck march 2013, repeat Neck US in 1 year to follow-up small nodules.  Head CT 2013: normal ? glosspharyngeal neuralgia.  On amitriptyline await [facility] ENt referral.     Tubular adenoma of colon    Tubular adenoma of colon    Ulnar neuropathy at elbow of right upper extremity 06/12/2018    Past Surgical History:  Procedure Laterality Date   ABDOMINAL HYSTERECTOMY  2006   AMPUTATION TOE Left 03/05/2015   Procedure: AMPUTATION LEFT 5TH  TOE;  Surgeon: Wylene Simmer, MD;  Location: [facility];  Service: Orthopedics;  Laterality: Left;   APPENDECTOMY  2006   BREAST BIOPSY Left 11/26/2014   CESAREAN SECTION  1979 1986 1990   CHOLECYSTECTOMY     COLONOSCOPY     GALLBLADDER SURGERY     LOOP RECORDER INSERTION N/A 09/18/2017   Procedure: LOOP RECORDER INSERTION;  Surgeon: Evans Lance, MD;  Location: [facility];  Service: Cardiovascular;  Laterality: N/A;   OPEN REDUCTION INTERNAL FIXATION (ORIF) DISTAL RADIAL FRACTURE Right 09/14/2019   Procedure: OPEN REDUCTION INTERNAL FIXATION (ORIF)  RADIAL AND ULNA FRACTURE;  Surgeon: Iran Planas, MD;  Location: [facility];  Service: Orthopedics;  Laterality: Right;   ORIF ULNAR FRACTURE Right 09/14/2019   TEE WITHOUT CARDIOVERSION N/A 09/18/2017   Procedure: TRANSESOPHAGEAL ECHOCARDIOGRAM (TEE);  Surgeon: Pixie Casino, MD;  Location: [facility];  Service: Cardiovascular;  Laterality: N/A;   TOE AMPUTATION Left 03/05/2015   5th toe   TUBAL LIGATION Bilateral 1990   UPPER GASTROINTESTINAL ENDOSCOPY  Prior to Admission medications   Medication Sig Start Date End Date Taking? Authorizing Provider  aspirin 81 MG chewable tablet Chew 1 tablet (81 mg total) by mouth daily. 09/19/17  Yes Bland, Scott, DO  carvedilol (COREG) 25 MG tablet TAKE 1 TABLET BY MOUTH TWICE DAILY WITH MEALS 02/23/22  Yes Eniola, Kehinde T, MD  CEQUA 0.09 % SOLN Apply 1 drop to eye 2 (two) times daily. 04/15/22  Yes [provider]  chlorthalidone (HYGROTON) 25 MG tablet Take 1 tablet by mouth once daily 02/04/22  Yes Skeet Latch, MD  ezetimibe (ZETIA) 10 MG tablet Take 1 tablet (10 mg total) by mouth daily. 04/06/22  Yes  [name] T, MD  hydrALAZINE (APRESOLINE) 100 MG tablet TAKE 1 TABLET BY MOUTH THREE TIMES DAILY 02/23/22  Yes [name] T, MD  levothyroxine (SYNTHROID) 175 MCG tablet TAKE 1 TABLET BY MOUTH ONCE DAILY IN THE MORNING 30  MINUTES  BEFORE  FOOD 03/02/22  Yes [name] T, MD  olopatadine (PATANOL) 0.1 % ophthalmic solution 1 drop 2 (two) times daily.   Yes [provider]  spironolactone (ALDACTONE) 50 MG tablet Take 1 tablet by mouth once daily 01/12/22  Yes Eniola, Kehinde T, MD  acetaminophen (TYLENOL) 500 MG tablet Take 500 mg by mouth every 6 (six) hours as needed. Patient not taking: Reported on 05/02/2022    [provider]  acetaminophen-codeine (TYLENOL #3) 300-30 MG tablet Take 1 tablet by mouth every 8 (eight) hours as needed for moderate pain. 05/31/22   Aundra Dubin, PA-C  naproxen (NAPROSYN) 500 MG tablet Take 1 tablet (500 mg total) by mouth 2 (two) times daily as needed. Patient not taking: Reported on 05/02/2022 01/26/21   Kinnie Feil, MD  predniSONE (STERAPRED UNI-PAK 21 TAB) 10 MG (21) TBPK tablet Take as directed Patient not taking: Reported on 06/03/2022 05/31/22   Aundra Dubin, PA-C  tiZANidine (ZANAFLEX) 2 MG tablet Take 1 tablet (2 mg total) by mouth every 12 (twelve) hours as needed for muscle spasms. Patient not taking: Reported on 05/02/2022 11/09/21   Kinnie Feil, MD    Current Outpatient Medications  Medication Sig Dispense Refill   aspirin 81 MG chewable tablet Chew 1 tablet (81 mg total) by mouth daily. 30 tablet 0   carvedilol (COREG) 25 MG tablet TAKE 1 TABLET BY MOUTH TWICE DAILY WITH MEALS 180 tablet 1   CEQUA 0.09 % SOLN Apply 1 drop to eye 2 (two) times daily.     chlorthalidone (HYGROTON) 25 MG tablet Take 1 tablet by mouth once daily 90 tablet 1   ezetimibe (ZETIA) 10 MG tablet Take 1 tablet (10 mg total) by mouth daily. 30 tablet 1   hydrALAZINE (APRESOLINE) 100 MG tablet TAKE 1 TABLET BY MOUTH THREE TIMES DAILY  270 tablet 1   levothyroxine (SYNTHROID) 175 MCG tablet TAKE 1 TABLET BY MOUTH ONCE DAILY IN THE MORNING 30  MINUTES  BEFORE  FOOD 90 tablet 1   olopatadine (PATANOL) 0.1 % ophthalmic solution 1 drop 2 (two) times daily.     spironolactone (ALDACTONE) 50 MG tablet Take 1 tablet by mouth once daily 90 tablet 1   acetaminophen (TYLENOL) 500 MG tablet Take 500 mg by mouth every 6 (six) hours as needed. (Patient not taking: Reported on 05/02/2022)     acetaminophen-codeine (TYLENOL #3) 300-30 MG tablet Take 1 tablet by mouth every 8 (eight) hours as needed for moderate pain. 30 tablet 2   naproxen (NAPROSYN) 500  MG tablet Take 1 tablet (500 mg total) by mouth 2 (two) times daily as needed. (Patient not taking: Reported on 05/02/2022) 30 tablet 0   predniSONE (STERAPRED UNI-PAK 21 TAB) 10 MG (21) TBPK tablet Take as directed (Patient not taking: Reported on 06/03/2022) 21 tablet 0   tiZANidine (ZANAFLEX) 2 MG tablet Take 1 tablet (2 mg total) by mouth every 12 (twelve) hours as needed for muscle spasms. (Patient not taking: Reported on 05/02/2022) 30 tablet 1   Current Facility-Administered Medications  Medication Dose Route Frequency Provider Last Rate Last Admin   0.9 %  sodium chloride infusion  500 mL Intravenous Once Maddox Hlavaty, Lajuan Lines, MD        Allergies as of 06/03/2022 - Review Complete 06/03/2022  Allergen Reaction Noted   Amlodipine Swelling and Other (See Comments) 09/15/2017   Bactrim [sulfamethoxazole-trimethoprim] Anaphylaxis 04/15/2016   Clonidine derivatives Swelling and Other (See Comments) 10/06/2011   Lisinopril Swelling and Other (See Comments) 09/15/2017   Statins Swelling 12/22/2017   Aspirin Nausea Only 05/04/2011   Latex Rash 10/27/2013    Family History  Problem Relation Age of Onset   Heart disease Mother        CAB age 55   Diabetes Mother    Cancer Father 58       pancreatic cancer   Stroke Father    Pancreatic cancer Sister    Diabetes Sister    Kidney disease  Sister        renal failure   Diabetes Brother    Kidney disease Brother        renal failure   Breast cancer Maternal Aunt        per pt aunts and uncles died of cancer not sure the type   Breast cancer Paternal Aunt    Cancer Paternal Uncle    Cancer Sister    Esophageal cancer Neg Hx    Rectal cancer Neg Hx    Stomach cancer Neg Hx    Thyroid disease Neg Hx    Colon cancer Neg Hx    Colon polyps Neg Hx     Social History   Socioeconomic History   Marital status: Single    Spouse name: Not on file   Number of children: 3   Years of education: Not on file   Highest education level: Not on file  Occupational History   Occupation: cosmetolgist  Tobacco Use   Smoking status: Former    Packs/day: 1.00    Years: 36.00    Total pack years: 36.00    Types: Cigarettes    Quit date: 09/23/2017    Years since quitting: 4.6   Smokeless tobacco: Never   Tobacco comments:    Tobacco info given 03/01/17  Vaping Use   Vaping Use: Never used  Substance and Sexual Activity   Alcohol use: Never   Drug use: Never   Sexual activity: Not Currently    Birth control/protection: Post-menopausal, Surgical  Other Topics Concern   Not on file  Social History Narrative   Lives with two adult children.  Another daughter lives in [place].  Cosmetologist.  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  Likes to fish and play bingo.  Goes to church.   Social Determinants of Health   Financial Resource Strain: Not on file  Food Insecurity: No Food Insecurity (05/02/2022)   Hunger Vital Sign    Worried About Running Out of Food in the Last Year: Never true    Ran Out of  Food in the Last Year: Never true  Transportation Needs: No Transportation Needs (05/03/2022)   PRAPARE - Hydrologist (Medical): No    Lack of Transportation (Non-Medical): No  Physical Activity: Not on file  Stress: Not on file  Social Connections: Not on file  Intimate Partner Violence: Not At Risk (05/03/2022)    Humiliation, Afraid, Rape, and Kick questionnaire    Fear of Current or Ex-Partner: No    Emotionally Abused: No    Physically Abused: No    Sexually Abused: No    Physical Exam: Vital signs in last 24 hours: '@BP'$  (!) 143/91   Pulse 67   Temp (!) 96.9 F (36.1 C) (Temporal)   Ht '5\' 5"'$  (1.651 m)   Wt 207 lb (93.9 kg)   SpO2 97%   BMI 34.45 kg/m  GEN: NAD EYE: Sclerae anicteric ENT: MMM CV: Non-tachycardic Pulm: CTA b/l GI: Soft, NT/ND NEURO:  Alert & Oriented x 3   Zenovia Jarred, MD [facility]  06/03/2022 9:33 AM

## 2022-06-03 NOTE — Patient Instructions (Signed)
Please read handouts provided. Continue present medications. Repeat colonoscopy in 5 years for screening.   YOU HAD AN ENDOSCOPIC PROCEDURE TODAY AT THE [facility]:   Refer to the procedure report that was given to you for any specific questions about what was found during the examination.  If the procedure report does not answer your questions, please call your gastroenterologist to clarify.  If you requested that your care partner not be given the details of your procedure findings, then the procedure report has been included in a sealed envelope for you to review at your convenience later.  YOU SHOULD EXPECT: Some feelings of bloating in the abdomen. Passage of more gas than usual.  Walking can help get rid of the air that was put into your GI tract during the procedure and reduce the bloating. If you had a lower endoscopy (such as a colonoscopy or flexible sigmoidoscopy) you may notice spotting of blood in your stool or on the toilet paper. If you underwent a bowel prep for your procedure, you may not have a normal bowel movement for a few days.  Please Note:  You might notice some irritation and congestion in your nose or some drainage.  This is from the oxygen used during your procedure.  There is no need for concern and it should clear up in a day or so.  SYMPTOMS TO REPORT IMMEDIATELY:  Following lower endoscopy (colonoscopy or flexible sigmoidoscopy):  Excessive amounts of blood in the stool  Significant tenderness or worsening of abdominal pains  Swelling of the abdomen that is new, acute  Fever of 100F or higher  For urgent or emergent issues, a gastroenterologist can be reached at any hour by calling [phone]. Do not use MyChart messaging for urgent concerns.    DIET:  We do recommend a small meal at first, but then you may proceed to your regular diet.  Drink plenty of fluids but you should avoid alcoholic beverages for 24 hours.  ACTIVITY:  You should plan  to take it easy for the rest of today and you should NOT DRIVE or use heavy machinery until tomorrow (because of the sedation medicines used during the test).    FOLLOW UP: Our staff will call the number listed on your records the next business day following your procedure.  We will call around 7:15- 8:00 am to check on you and address any questions or concerns that you may have regarding the information given to you following your procedure. If we do not reach you, we will leave a message.     If any biopsies were taken you will be contacted by phone or by letter within the next 1-3 weeks.  Please call us at [phone] if you have not heard about the biopsies in 3 weeks.    SIGNATURES/CONFIDENTIALITY: You and/or your care partner have signed paperwork which will be entered into your electronic medical record.  These signatures attest to the fact that that the information above on your After Visit Summary has been reviewed and is understood.  Full responsibility of the confidentiality of this discharge information lies with you and/or your care-partner. 

## 2022-06-03 NOTE — Op Note (Signed)
[facility] Patient Name: Jonathan Kirkendoll Procedure Date: 06/03/2022 9:35 AM MRN: [id number] Endoscopist: Jerene Bears , MD, [id number] Age: 61 Referring MD:  Date of Birth: 03-Apr-1961 Gender: Female Account #: [id number] Procedure:                Colonoscopy Indications:              High risk colon cancer surveillance: Personal                            history of non-advanced adenomas, Family history of                            colon cancer in a first-degree relative, Last                            colonoscopy: June 2018 Medicines:                Monitored Anesthesia Care Procedure:                Pre-Anesthesia Assessment:                           - Prior to the procedure, a History and Physical                            was performed, and patient medications and                            allergies were reviewed. The patient's tolerance of                            previous anesthesia was also reviewed. The risks                            and benefits of the procedure and the sedation                            options and risks were discussed with the patient.                            All questions were answered, and informed consent                            was obtained. Prior Anticoagulants: The patient has                            taken no anticoagulant or antiplatelet agents. ASA                            Grade Assessment: III - A patient with severe                            systemic disease. After reviewing the risks and  benefits, the patient was deemed in satisfactory                            condition to undergo the procedure.                           After obtaining informed consent, the colonoscope                            was passed under direct vision. Throughout the                            procedure, the patient's blood pressure, pulse, and                            oxygen saturations were monitored  continuously. The                            Olympus PCF-H190DL ([id number]) Colonoscope was                            introduced through the anus and advanced to the                            cecum, identified by appendiceal orifice and                            ileocecal valve. The colonoscopy was performed                            without difficulty. The patient tolerated the                            procedure well. The quality of the bowel                            preparation was good. The ileocecal valve,                            appendiceal orifice, and rectum were photographed. Scope In: 1:77:11 AM Scope Out: 9:56:43 AM Scope Withdrawal Time: 0 hours 8 minutes 52 seconds  Total Procedure Duration: 0 hours 10 minutes 26 seconds  Findings:                 The digital rectal exam was normal.                           Multiple medium-mouthed diverticula were found in                            the sigmoid colon.                           The exam was otherwise without abnormality on  direct and retroflexion views. Complications:            No immediate complications. Estimated Blood Loss:     Estimated blood loss: none. Impression:               - Mild diverticulosis in the sigmoid colon.                           - The examination was otherwise normal on direct                            and retroflexion views.                           - No specimens collected. Recommendation:           - Patient has a contact number available for                            emergencies. The signs and symptoms of potential                            delayed complications were discussed with the                            patient. Return to normal activities tomorrow.                            Written discharge instructions were provided to the                            patient.                           - Resume previous diet.                           -  Continue present medications.                           - Repeat colonoscopy in 5 years for surveillance. Jerene Bears, MD 06/03/2022 9:59:47 AM This report has been signed electronically.

## 2022-06-03 NOTE — Progress Notes (Signed)
Sedate, gd SR, tolerated procedure well, VSS, report to RN 

## 2022-06-06 ENCOUNTER — Other Ambulatory Visit: Payer: Self-pay | Admitting: Cardiovascular Disease

## 2022-06-06 ENCOUNTER — Other Ambulatory Visit: Payer: Self-pay | Admitting: Family Medicine

## 2022-06-06 ENCOUNTER — Telehealth: Payer: Self-pay | Admitting: Family Medicine

## 2022-06-06 ENCOUNTER — Ambulatory Visit
Admission: RE | Admit: 2022-06-06 | Discharge: 2022-06-06 | Disposition: A | Discharge status: Home / Self Care | Payer: Medicare Other | Source: Ambulatory Visit | Attending: Family Medicine | Admitting: Family Medicine

## 2022-06-06 ENCOUNTER — Telehealth: Payer: Self-pay | Admitting: *Deleted

## 2022-06-06 DIAGNOSIS — Z122 Encounter for screening for malignant neoplasm of respiratory organs: Secondary | ICD-10-CM

## 2022-06-06 DIAGNOSIS — J432 Centrilobular emphysema: Secondary | ICD-10-CM | POA: Diagnosis not present

## 2022-06-06 DIAGNOSIS — I771 Stricture of artery: Secondary | ICD-10-CM | POA: Diagnosis not present

## 2022-06-06 DIAGNOSIS — Z87891 Personal history of nicotine dependence: Secondary | ICD-10-CM | POA: Diagnosis not present

## 2022-06-06 DIAGNOSIS — I2510 Atherosclerotic heart disease of native coronary artery without angina pectoris: Secondary | ICD-10-CM | POA: Diagnosis not present

## 2022-06-06 NOTE — Telephone Encounter (Signed)
Called patient. Discussed CT results. No pulmonary nodules, repeat 1 year. Already on Zetia, which she will continued. Discussed moderate emphysema finding.  Dorris Singh, MD  Family Medicine Teaching Service

## 2022-06-06 NOTE — Telephone Encounter (Signed)
Rx(s) sent to pharmacy electronically.  

## 2022-06-06 NOTE — Telephone Encounter (Signed)
  Follow up Call-    Row Labels 06/03/2022    8:57 AM  Call back number   Section Header. No data exists in this row.   Post procedure Call Back phone  #   [phone]  Permission to leave phone message   Yes     Patient questions:  Do you have a fever, pain , or abdominal swelling? No. Pain Score  0 *  Have you tolerated food without any problems? Yes.    Have you been able to return to your normal activities? Yes.    Do you have any questions about your discharge instructions: Diet   No. Medications  No. Follow up visit  No.  Do you have questions or concerns about your Care? No.  Actions: * If pain score is 4 or above: No action needed, pain <4.

## 2022-06-07 ENCOUNTER — Encounter: Payer: Medicaid Other | Admitting: Physical Therapy

## 2022-06-09 ENCOUNTER — Ambulatory Visit: Payer: Medicare Other | Admitting: Physical Therapy

## 2022-06-09 ENCOUNTER — Telehealth: Payer: Self-pay | Admitting: Orthopaedic Surgery

## 2022-06-09 NOTE — Telephone Encounter (Signed)
Pt called stating insurance wont pay for the amount (qty) pt states pharmacy told her dr need to drop the quantity of pills so insurance can pay for her pain medication. Please call pt about this matter at [phone].

## 2022-06-10 ENCOUNTER — Other Ambulatory Visit: Payer: Self-pay | Admitting: Physician Assistant

## 2022-06-10 MED ORDER — ACETAMINOPHEN-CODEINE 300-30 MG PO TABS: 1.0000 | ORAL_TABLET | Freq: Two times a day (BID) | ORAL | 2 refills | Status: DC | PRN

## 2022-06-10 NOTE — Telephone Encounter (Signed)
Sent in

## 2022-06-10 NOTE — Telephone Encounter (Signed)
See message.  Thanks.

## 2022-06-10 NOTE — Telephone Encounter (Signed)
Can you guys confirm which rx this is for?

## 2022-06-10 NOTE — Telephone Encounter (Signed)
Patient wants tylenol 3

## 2022-06-10 NOTE — Telephone Encounter (Signed)
Pt called in stating that she spoke with the pharmacy and they advised her that her insurance will not cover the medication do to the pill quantity... Pt stated that the pharmacy advised her that the amount needs to be change for the insurance to cover the medication... Pt requesting callback.Marland KitchenMarland Kitchen

## 2022-06-13 ENCOUNTER — Telehealth: Payer: Self-pay

## 2022-06-13 NOTE — Telephone Encounter (Signed)
Let's have her come in for repeat eval.  Thanks.

## 2022-06-13 NOTE — Telephone Encounter (Signed)
Patient called stating that she has not had any improvement and would like to proceed with MRI C-Spine and would need to be put asleep. CB# [phone].  Please advise.  Thank you

## 2022-06-13 NOTE — Telephone Encounter (Signed)
Please advise 

## 2022-06-14 ENCOUNTER — Encounter: Payer: Medicaid Other | Admitting: Physical Therapy

## 2022-06-14 NOTE — Telephone Encounter (Signed)
Not sure where you want to put patient on your schedule. He is completely booked until January.

## 2022-06-14 NOTE — Telephone Encounter (Signed)
Called and scheduled patient

## 2022-06-16 ENCOUNTER — Encounter: Payer: Self-pay | Admitting: Family Medicine

## 2022-06-16 ENCOUNTER — Encounter: Payer: Medicaid Other | Admitting: Physical Therapy

## 2022-06-16 NOTE — Telephone Encounter (Signed)
Called patient to discuss message further.   She reports increased SHOB over the last week. Feels like she has possibly gained weight that has contributed to this. SHOB is mostly associated with activity.   Denies chest pain, no current SHOB.   Zetia started in October. Patient reports that she has been having arm pain and "changes in her body" since being on this medication.   Advised patient that she should be seen for further evaluation. Scheduled patient for tomorrow morning with Dr. Owens Shark.   ED precautions discussed.   Talbot Grumbling, RN

## 2022-06-16 NOTE — Telephone Encounter (Signed)
Dr. Owens Shark asked if patient could come to office this afternoon for evaluation.   Called patient to attempt to reschedule for this afternoon. Patient reports that she has to work at 5 pm this afternoon and feels that she would be too rushed, given our appointment availability.   ED precautions reiterated.   Talbot Grumbling, RN

## 2022-06-17 ENCOUNTER — Ambulatory Visit (INDEPENDENT_AMBULATORY_CARE_PROVIDER_SITE_OTHER): Payer: Medicare Other | Admitting: Family Medicine

## 2022-06-17 ENCOUNTER — Other Ambulatory Visit: Payer: Self-pay

## 2022-06-17 ENCOUNTER — Encounter: Payer: Self-pay | Admitting: Family Medicine

## 2022-06-17 VITALS — BP 124/72 | HR 64 | Wt 208.8 lb

## 2022-06-17 DIAGNOSIS — R140 Abdominal distension (gaseous): Secondary | ICD-10-CM | POA: Diagnosis not present

## 2022-06-17 DIAGNOSIS — R0609 Other forms of dyspnea: Secondary | ICD-10-CM

## 2022-06-17 DIAGNOSIS — E038 Other specified hypothyroidism: Secondary | ICD-10-CM | POA: Diagnosis not present

## 2022-06-17 DIAGNOSIS — I5032 Chronic diastolic (congestive) heart failure: Secondary | ICD-10-CM | POA: Diagnosis not present

## 2022-06-17 LAB — POCT HEMOGLOBIN: Hemoglobin: 14.2 g/dL (ref 11–14.6)

## 2022-06-17 MED ORDER — SEMAGLUTIDE(0.25 OR 0.5MG/DOS) 2 MG/1.5ML ~~LOC~~ SOPN: 0.2500 mg | PEN_INJECTOR | SUBCUTANEOUS | 3 refills | Status: DC

## 2022-06-17 NOTE — Progress Notes (Signed)
SUBJECTIVE:   CHIEF COMPLAINT: body aches and dyspnea HPI:   Francoise Chojnowski is a 61 y.o.  with history notable for HFpEF, HTN, CVA and PAD  presenting for body aches and dyspnea.  The patient reports that since starting Zetia she has felt worsening leg cramps and overall fatigue. This onset shortly after starting the Zetia. Around the same time, she began to notice that when walking from the bathroom to her bedroom she sometimes has to pause to take a break. She endorses dyspnea when she bends over. She reports this has been ongoing and stable for about 2-3 months. Denies chest pain, palpitations, melena, hematochezia, or other symptoms with this. Denies edema, orthopnea, PND. She is using her CPAP. She is taking all pills as prescribed. She believes much of her dyspnea is related to her abdominal girth. Her symptoms also improve if she is rested and has a nap.   Weight loss The patient desires weight loss and particularly loss of central adipose tissue. Has tried multiple dietary interventions and has seen Dr. Jenne Campus. Was on Ozempic but stopped due to cost. Her weight has been relatively stable. She is on disability due to stroke but still works 4 hours per day. Not going to the gym due to neck pain   Abdominal bloating The patient reports she thinks her intermittent dyspnea is related to abdominal girth. She reports bloating and increased abdominal girth. She has dramatically changed her diet--eating more fiber, eliminated fried foods (exception is french fries once in a while), increasing omega 3 and 6 including avocado and olive oil. She is using only baking or air fryer for all foods. She has a family history of pancreatitic cancer and is worried about this. No pain, nausea, vomiting, weight loss, melena, hematochezia. Had a colonoscopy recently and has had gas since that time. Colonoscopy showed entirely normal colon with diverticula only.    PERTINENT  PMH / PSH/Family/Social History :  CVA, HTN, HFpEF   OBJECTIVE:   BP 124/72   Pulse 64   Wt 208 lb 12.8 oz (94.7 kg)   SpO2 100%   BMI 34.75 kg/m   Today's weight:  Last Weight  Most recent update: 06/17/2022  9:49 AM    Weight  94.7 kg (208 lb 12.8 oz)            Review of prior weights: [employer]   06/17/22 0949  Weight: 208 lb 12.8 oz (94.7 kg)    HEENT Impacted cerumen bilaterally Oral mucosa unremarkable   Cardiac: Regular rate and rhythm. Normal S1/S2. No murmurs, rubs, or gallops appreciated. Lungs: Clear bilaterally to ascultation.  Abdomen: Normoactive bowel sounds. No tenderness to deep or light palpation. No rebound or guarding.   Psych: Pleasant and appropriate  No edema    ASSESSMENT/PLAN:   Fatigue and intermittent dyspnea Differential is broad, including progression of heart failure (although no edema or weight gain), anemia (normal Hgb), uncontrolled OSA. Lower supsicion for angina equivalent as this occurs primarily when she bends over and most of her symptoms are fatigue and muscle aches. HR and oxygen appropriate, low concern for PE at this time. Reviewed return precautions.  - Will defer chest imaging as had recent chest CT during which time this was occurring that was normal - Check CK, CMP, CBC today - Check CK today - Discussed stopping Zetia-recommended continuing until labs return, could consider 2 week trial with retrial or bempedoic acid  - Recommended she call Dr. Oval Linsey to 1.)  discuss symptoms 2.) discuss alternative therapy (does not want to PCSK9 inhibitor, could consider bempedoic acid)    CVA with Obesity - Discussed options - Re-start Ozempic at 0.25 mg - Follow up 4 weeks for dose titration - Offered and recommended Dr. Jenne Campus - Referral to [facility]  Abdominal Bloating No fluid wave, masses, or pain on exam - Discussed obtaining CT of abdomen/pelvis given symptoms, concerns related to family history - Patient elected to defer until after MRI of neck -  Instructed to call to be seen if symptoms change - Consider CT pending progression of symptoms   She is up to date on HCM.  Follow up with Dr. Gwendlyn Deutscher in 4 weeks to titrate Ozempic, follow up on stomach     Dorris Singh, MD  [place]

## 2022-06-17 NOTE — Patient Instructions (Signed)
It was wonderful to see you today.  Please bring ALL of your medications with you to every visit.   Today we talked about:  For your leg cramps and shortness of breath at times - We will check blood work  - You will be called about a repeat echocardiogram (heart ultrasound)  - Please call Dr. Blenda Mounts office to follow up and discuss cholesterol pills   For your abdomen - Let us know if you want to proceed with CT    For your weight loss - You will be called about [facility] - I sent in a Ozempic   Follow up in 4 weeks with Dr. Gwendlyn Deutscher    Thank you for choosing [facility].   Please call [phone] with any questions about today's appointment.  Please be sure to schedule follow up at the front  desk before you leave today.   Dorris Singh, MD  Family Medicine

## 2022-06-19 LAB — CBC WITH DIFFERENTIAL/PLATELET
Basophils Absolute: 0.1 10*3/uL (ref 0.0–0.2)
Basos: 1 %
EOS (ABSOLUTE): 0.2 10*3/uL (ref 0.0–0.4)
Eos: 2 %
Hematocrit: 41.5 % (ref 34.0–46.6)
Hemoglobin: 13.2 g/dL (ref 11.1–15.9)
Immature Grans (Abs): 0.1 10*3/uL (ref 0.0–0.1)
Immature Granulocytes: 1 %
Lymphocytes Absolute: 2.5 10*3/uL (ref 0.7–3.1)
Lymphs: 30 %
MCH: 25.1 pg — ABNORMAL LOW (ref 26.6–33.0)
MCHC: 31.8 g/dL (ref 31.5–35.7)
MCV: 79 fL (ref 79–97)
Monocytes Absolute: 0.3 10*3/uL (ref 0.1–0.9)
Monocytes: 4 %
Neutrophils Absolute: 5.2 10*3/uL (ref 1.4–7.0)
Neutrophils: 62 %
Platelets: 289 10*3/uL (ref 150–450)
RBC: 5.26 x10E6/uL (ref 3.77–5.28)
RDW: 13.1 % (ref 11.7–15.4)
WBC: 8.3 10*3/uL (ref 3.4–10.8)

## 2022-06-19 LAB — CK: Total CK: 54 U/L (ref 32–182)

## 2022-06-19 LAB — LIPASE: Lipase: 30 U/L (ref 14–72)

## 2022-06-19 LAB — COMPREHENSIVE METABOLIC PANEL
ALT: 44 IU/L — ABNORMAL HIGH (ref 0–32)
AST: 19 IU/L (ref 0–40)
Albumin/Globulin Ratio: 1.6 (ref 1.2–2.2)
Albumin: 4.1 g/dL (ref 3.8–4.9)
Alkaline Phosphatase: 78 IU/L (ref 44–121)
BUN/Creatinine Ratio: 17 (ref 12–28)
BUN: 20 mg/dL (ref 8–27)
Bilirubin Total: 0.2 mg/dL (ref 0.0–1.2)
CO2: 22 mmol/L (ref 20–29)
Calcium: 10.2 mg/dL (ref 8.7–10.3)
Chloride: 101 mmol/L (ref 96–106)
Creatinine, Ser: 1.16 mg/dL — ABNORMAL HIGH (ref 0.57–1.00)
Globulin, Total: 2.5 g/dL (ref 1.5–4.5)
Glucose: 158 mg/dL — ABNORMAL HIGH (ref 70–99)
Potassium: 4.1 mmol/L (ref 3.5–5.2)
Sodium: 141 mmol/L (ref 134–144)
Total Protein: 6.6 g/dL (ref 6.0–8.5)
eGFR: 54 mL/min/{1.73_m2} — ABNORMAL LOW (ref >59)

## 2022-06-19 LAB — BRAIN NATRIURETIC PEPTIDE: BNP: 13.4 pg/mL (ref 0.0–100.0)

## 2022-06-19 LAB — TSH: TSH: 4.55 u[IU]/mL — ABNORMAL HIGH (ref 0.450–4.500)

## 2022-06-21 ENCOUNTER — Telehealth: Payer: Self-pay | Admitting: Family Medicine

## 2022-06-21 ENCOUNTER — Encounter: Payer: Medicaid Other | Admitting: Physical Therapy

## 2022-06-21 NOTE — Telephone Encounter (Signed)
Called patient to discuss results. For elevated glucose, recommend A1C repeat at follow up. She is already starting Ozempic. Follow up in 4 weeks to titrate. For TSH, recommended increasing dose of Synthroid. She declined at this time. Discussed rationale, she would prefer to try vinegar water for 4-6 weeks. Repeat in 4 weeks with Dr. Luanne Bras of labs appropriate, recommend hepatic panel for isolated ALT elevation at follow up. All questions answered Dorris Singh, MD  [facility] Medicine Teaching Service

## 2022-06-23 ENCOUNTER — Encounter: Payer: Medicaid Other | Admitting: Physical Therapy

## 2022-06-23 ENCOUNTER — Encounter: Payer: Self-pay | Admitting: Physician Assistant

## 2022-06-23 ENCOUNTER — Ambulatory Visit (INDEPENDENT_AMBULATORY_CARE_PROVIDER_SITE_OTHER): Payer: Medicare Other | Admitting: Physician Assistant

## 2022-06-23 DIAGNOSIS — G4733 Obstructive sleep apnea (adult) (pediatric): Secondary | ICD-10-CM | POA: Diagnosis not present

## 2022-06-23 DIAGNOSIS — I10 Essential (primary) hypertension: Secondary | ICD-10-CM | POA: Diagnosis not present

## 2022-06-23 DIAGNOSIS — G8929 Other chronic pain: Secondary | ICD-10-CM

## 2022-06-23 DIAGNOSIS — M25512 Pain in left shoulder: Secondary | ICD-10-CM

## 2022-06-23 NOTE — Progress Notes (Signed)
Office Visit Note   Patient: Teresa [name]           Date of Birth: October 07, 1960           MRN: [id number] Visit Date: 06/23/2022              Requested by: Kinnie Feil, MD [address] [place],  [place] [address] PCP: Kinnie Feil, MD   Assessment & Plan: Visit Diagnoses:  1. Chronic left shoulder pain     Plan: Impression is chronic left parascapular pain.  It is somewhat difficult to discern whether her symptoms are coming from her neck or shoulder however inclined to think this is referred from the neck.  She has tried physical therapy in addition to over-the-counter and prescription medication all without significant relief.  I would like to obtain MRIs of both the neck and left shoulder to assess for structural abnormalities.  She notes that she will need to be sedated for these.  She will follow-up after the MRIs have been completed.  Follow-Up Instructions: Return for after MRI.   Orders:  No orders of the defined types were placed in this encounter.  No orders of the defined types were placed in this encounter.     Procedures: No procedures performed   Clinical Data: No additional findings.   Subjective: Chief Complaint  Patient presents with   Neck - Follow-up    HPI patient is a pleasant 61 year old female who comes in today with continued left parascapular pain.  Symptoms began back in October of this past year.  She has been sleeping with her grandchild and felt maybe she had slept in an awkward position which began the onset of her symptoms.  She also notes that around that time she started a statin medication.  The pain she has been having radiates from the left traps down the left arm.  She has associated numbness and tingling to the left upper extremity.  She has tried over-the-counter and prescription medications to include steroids and muscle relaxers and has also been to physical therapy where she has had dry needling in addition to  stretching.  None of the above have provided any significant relief.  Review of Systems as detailed in HPI.  All others reviewed and are negative.   Objective: Vital Signs: There were no vitals taken for this visit.  Physical Exam well-developed and well-nourished female no acute distress.  Alert and oriented x 3.  Ortho Exam unchanged cervical spine and left upper extremity exam  Specialty Comments:  No specialty comments available.  Imaging: No new imaging   PMFS History: Patient Active Problem List   Diagnosis Date Noted   Eustachian tube dysfunction, left 05/03/2022   Vitreous flashes of both eyes 11/26/2021   Obstructive sleep apnea 07/05/2021   Bertolotti's syndrome 05/26/2021   Cervical radiculopathy 05/11/2021   Fatigue 05/11/2021   CAD in native artery 01/12/2021   Obese 01/11/2021   Peripheral vascular disease ([place]) 06/12/2020   History of forearm fracture 05/01/2020   History of hiatal hernia 05/01/2020   CKD (chronic kidney disease), stage II 01/15/2020   Tubular adenoma of colon 09/14/2019   Chronic diastolic CHF (congestive heart failure) ([place]) 09/14/2019   COVID-19 07/02/2019   Ulnar neuropathy at elbow of right upper extremity 06/12/2018   Migraine 01/12/2018   Hyperlipidemia    History of CVA (cerebrovascular accident) 09/15/2017   History of cholecystectomy 06/27/2017   Small intestinal bacterial overgrowth 03/23/2017   Paresthesia  01/06/2017   Prediabetes 10/29/2015   Tendinopathy of left rotator cuff 10/27/2015   History of amputation of lesser toe, left ([place]) 03/11/2015   Resistant hypertension    Thyroid nodule 02/12/2013   GERD (gastroesophageal reflux disease) 02/06/2012   History of tobacco abuse 09/21/2011   Hypertension 09/21/2011   Hypothyroidism 09/21/2011   Past Medical History:  Diagnosis Date   Allergy    Anemia    Cervical disc disorder with radiculopathy of cervical region 02/10/2016   Chronic lower back pain     Claudication in peripheral vascular disease ([place]) 06/12/2020   Diverticulosis    Ear discomfort 03/11/2016   EKG, abnormal 01/06/2017   Gastritis    GERD (gastroesophageal reflux disease)    H/O hiatal hernia    Hiatal hernia    History of amputation of lesser toe of left foot ([place]) 03/11/2015   History of colon cancer    Hypertension    Hyperthyroidism    hx   Hypothyroidism    Lateral knee pain, left 08/06/2013   Left shoulder pain 05/28/2015   Migraine headache    "a few times/yr" (03/05/2015)   Neck pain 02/11/2014   Neck strain 02/06/2012   Numbness and tingling in right hand 05/16/2018   Osteomyelitis ([place])    [name] 03/05/2015   Osteomyelitis of toe of left foot ([place]) 03/05/2015   Palpitations 01/05/2016   Positive TB test    Sleep apnea    Snoring 02/15/2021   Stroke ([place]) 08/2017   Throat pain 09/21/2011   Normal CT and US neck march 2013, repeat Neck US in 1 year to follow-up small nodules.  Head CT 2013: normal ? glosspharyngeal neuralgia.  On amitriptyline await [facility] ENt referral.    Tubular adenoma of colon    Tubular adenoma of colon    Ulnar neuropathy at elbow of right upper extremity 06/12/2018    Family History  Problem Relation Age of Onset   Heart disease Mother        CAB age 81   Diabetes Mother    Cancer Father 31       pancreatic cancer   Stroke Father    Pancreatic cancer Sister    Diabetes Sister    Kidney disease Sister        renal failure   Diabetes Brother    Kidney disease Brother        renal failure   Breast cancer Maternal Aunt        per pt aunts and uncles died of cancer not sure the type   Breast cancer Paternal Aunt    Cancer Paternal Uncle    Cancer Sister    Esophageal cancer Neg Hx    Rectal cancer Neg Hx    Stomach cancer Neg Hx    Thyroid disease Neg Hx    Colon cancer Neg Hx    Colon polyps Neg Hx     Past Surgical History:  Procedure Laterality Date   ABDOMINAL HYSTERECTOMY  2006   AMPUTATION TOE Left 03/05/2015    Procedure: AMPUTATION LEFT 5TH  TOE;  Surgeon: Wylene Simmer, MD;  Location: [facility];  Service: Orthopedics;  Laterality: Left;   APPENDECTOMY  2006   BREAST BIOPSY Left 11/26/2014   CESAREAN SECTION  1979 1986 1990   CHOLECYSTECTOMY     COLONOSCOPY     GALLBLADDER SURGERY     LOOP RECORDER INSERTION N/A 09/18/2017   Procedure: LOOP RECORDER INSERTION;  Surgeon: Evans Lance, MD;  Location: [facility];  Service: Cardiovascular;  Laterality: N/A;   OPEN REDUCTION INTERNAL FIXATION (ORIF) DISTAL RADIAL FRACTURE Right 09/14/2019   Procedure: OPEN REDUCTION INTERNAL FIXATION (ORIF)  RADIAL AND ULNA FRACTURE;  Surgeon: Iran Planas, MD;  Location: [facility];  Service: Orthopedics;  Laterality: Right;   ORIF ULNAR FRACTURE Right 09/14/2019   TEE WITHOUT CARDIOVERSION N/A 09/18/2017   Procedure: TRANSESOPHAGEAL ECHOCARDIOGRAM (TEE);  Surgeon: Pixie Casino, MD;  Location: [facility];  Service: Cardiovascular;  Laterality: N/A;   TOE AMPUTATION Left 03/05/2015   5th toe   TUBAL LIGATION Bilateral 1990   UPPER GASTROINTESTINAL ENDOSCOPY     Social History   Occupational History   Occupation: cosmetolgist  Tobacco Use   Smoking status: Former    Packs/day: 1.00    Years: 36.00    Total pack years: 36.00    Types: Cigarettes    Quit date: 09/23/2017    Years since quitting: 4.7   Smokeless tobacco: Never   Tobacco comments:    Tobacco info given 03/01/17  Vaping Use   Vaping Use: Never used  Substance and Sexual Activity   Alcohol use: Never   Drug use: Never   Sexual activity: Not Currently    Birth control/protection: Post-menopausal, Surgical

## 2022-06-24 ENCOUNTER — Encounter: Payer: Self-pay | Admitting: Family Medicine

## 2022-07-06 ENCOUNTER — Telehealth: Payer: Self-pay | Admitting: Orthopaedic Surgery

## 2022-07-06 ENCOUNTER — Other Ambulatory Visit: Payer: Self-pay | Admitting: Physician Assistant

## 2022-07-06 MED ORDER — ACETAMINOPHEN-CODEINE 300-30 MG PO TABS: 1.0000 | ORAL_TABLET | Freq: Two times a day (BID) | ORAL | 2 refills | Status: DC | PRN

## 2022-07-06 NOTE — Telephone Encounter (Signed)
Patient states she need are fill on her pain medication-tylenol #3 , call into [facility] on [address]. Please advise patient

## 2022-07-06 NOTE — Telephone Encounter (Signed)
Called and notified patient.

## 2022-07-06 NOTE — Telephone Encounter (Signed)
sent 

## 2022-07-08 ENCOUNTER — Encounter: Payer: Self-pay | Admitting: Family Medicine

## 2022-07-08 ENCOUNTER — Ambulatory Visit (INDEPENDENT_AMBULATORY_CARE_PROVIDER_SITE_OTHER): Payer: Medicare HMO | Admitting: Family Medicine

## 2022-07-08 VITALS — BP 156/86 | HR 73 | Ht 65.0 in | Wt 213.0 lb

## 2022-07-08 DIAGNOSIS — M25512 Pain in left shoulder: Secondary | ICD-10-CM | POA: Diagnosis not present

## 2022-07-08 DIAGNOSIS — M5412 Radiculopathy, cervical region: Secondary | ICD-10-CM | POA: Diagnosis not present

## 2022-07-08 DIAGNOSIS — G8929 Other chronic pain: Secondary | ICD-10-CM

## 2022-07-08 DIAGNOSIS — I1A0 Resistant hypertension: Secondary | ICD-10-CM

## 2022-07-08 DIAGNOSIS — R7309 Other abnormal glucose: Secondary | ICD-10-CM

## 2022-07-08 DIAGNOSIS — E041 Nontoxic single thyroid nodule: Secondary | ICD-10-CM

## 2022-07-08 DIAGNOSIS — E038 Other specified hypothyroidism: Secondary | ICD-10-CM | POA: Diagnosis not present

## 2022-07-08 MED ORDER — TIZANIDINE HCL 2 MG PO TABS: 2.0000 mg | ORAL_TABLET | Freq: Two times a day (BID) | ORAL | 1 refills | Status: DC | PRN

## 2022-07-08 NOTE — Assessment & Plan Note (Signed)
BP elevated during this visit as she is off her medication. However, BP has been well controlled on her current regimen. No medication adjustment at this time.

## 2022-07-08 NOTE — Patient Instructions (Signed)
  It was nice to see you. Please follow up in two week for thyroid and BP check.

## 2022-07-08 NOTE — Assessment & Plan Note (Signed)
Repeat US scheduled.

## 2022-07-08 NOTE — Assessment & Plan Note (Signed)
Discussed her recent TSH result, mildly elevated. Repeat in 2 weeks. She will schedule appointment.

## 2022-07-08 NOTE — Assessment & Plan Note (Addendum)
Scheduled for MRI. F/U with ortho as planned. See pre-procedure H&P below. Copy of the H&P also faxed to [facility] at [phone]
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
## 2022-07-08 NOTE — Assessment & Plan Note (Signed)
Likely DJD. Scheduled for MRI. F/U with ortho as planned. Refilled muscle relaxant.

## 2022-07-09 LAB — HEMOGLOBIN A1C
Est. average glucose Bld gHb Est-mCnc: 140 mg/dL
Hgb A1c MFr Bld: 6.5 % — ABNORMAL HIGH (ref 4.8–5.6)

## 2022-07-11 ENCOUNTER — Encounter: Payer: Self-pay | Admitting: Family Medicine

## 2022-07-11 ENCOUNTER — Telehealth: Payer: Self-pay | Admitting: Family Medicine

## 2022-07-11 DIAGNOSIS — E119 Type 2 diabetes mellitus without complications: Secondary | ICD-10-CM | POA: Insufficient documentation

## 2022-07-11 NOTE — Telephone Encounter (Signed)
Test result discussed. A1C in the diabetic range - continue Ozempic, and lifestyle modification. F/U appointment scheduled for repeat TSH.

## 2022-07-13 ENCOUNTER — Telehealth: Payer: Self-pay | Admitting: Orthopaedic Surgery

## 2022-07-13 NOTE — Telephone Encounter (Signed)
We just wait until closer to her appt time to do the H&P

## 2022-07-13 NOTE — Telephone Encounter (Signed)
Radiology advising that soonest date is 08/16/2022 unable to be change. Nothing earlier

## 2022-07-15 ENCOUNTER — Ambulatory Visit (HOSPITAL_COMMUNITY)
Admission: RE | Admit: 2022-07-15 | Discharge: 2022-07-15 | Disposition: A | Discharge status: Home / Self Care | Payer: Medicare HMO | Source: Ambulatory Visit | Attending: Family Medicine | Admitting: Family Medicine

## 2022-07-15 DIAGNOSIS — E041 Nontoxic single thyroid nodule: Secondary | ICD-10-CM

## 2022-07-15 DIAGNOSIS — I351 Nonrheumatic aortic (valve) insufficiency: Secondary | ICD-10-CM | POA: Insufficient documentation

## 2022-07-15 DIAGNOSIS — R0609 Other forms of dyspnea: Secondary | ICD-10-CM

## 2022-07-15 DIAGNOSIS — I10 Essential (primary) hypertension: Secondary | ICD-10-CM | POA: Insufficient documentation

## 2022-07-16 LAB — ECHOCARDIOGRAM COMPLETE
Area-P 1/2: 2.99 cm2
P 1/2 time: 494 msec
S' Lateral: 2.8 cm

## 2022-07-19 ENCOUNTER — Ambulatory Visit (INDEPENDENT_AMBULATORY_CARE_PROVIDER_SITE_OTHER): Payer: Medicare HMO | Admitting: Family Medicine

## 2022-07-19 ENCOUNTER — Encounter: Payer: Self-pay | Admitting: Family Medicine

## 2022-07-19 VITALS — BP 132/77 | HR 76 | Ht 65.0 in | Wt 206.0 lb

## 2022-07-19 DIAGNOSIS — E119 Type 2 diabetes mellitus without complications: Secondary | ICD-10-CM | POA: Diagnosis not present

## 2022-07-19 DIAGNOSIS — E118 Type 2 diabetes mellitus with unspecified complications: Secondary | ICD-10-CM | POA: Diagnosis not present

## 2022-07-19 DIAGNOSIS — I5032 Chronic diastolic (congestive) heart failure: Secondary | ICD-10-CM

## 2022-07-19 DIAGNOSIS — Z23 Encounter for immunization: Secondary | ICD-10-CM | POA: Diagnosis not present

## 2022-07-19 DIAGNOSIS — M5412 Radiculopathy, cervical region: Secondary | ICD-10-CM | POA: Diagnosis not present

## 2022-07-19 DIAGNOSIS — E039 Hypothyroidism, unspecified: Secondary | ICD-10-CM

## 2022-07-19 NOTE — Assessment & Plan Note (Signed)
Recent normal thyroid US. TSH ordered today. I will contact her soon with her results.

## 2022-07-19 NOTE — Patient Instructions (Signed)
Diphtheria; Tetanus; Pertussis (DTaP or Tdap) Vaccine Injection What is this medication? DIPHTHERIA; TETANUS; PERTUSSIS VACCINE (dif THEER ee uh; TET n [place]; per TUS iss VAK seen) reduces the risk of diphtheria, tetanus (lockjaw), and pertussis (whooping cough). It does not treat diphtheria, tetanus, or pertussis. It is still possible to get diphtheria, tetanus, or pertussis after receiving this vaccine, but the symptoms may be less severe or not last as long. It works by helping your immune system learn how to fight off a future infection. This medicine may be used for other purposes; ask your health care provider or pharmacist if you have questions. COMMON BRAND NAME(S): Adacel, Boostrix, Certiva, Daptacel, Infanrix, Tripedia What should I tell my care team before I take this medication? They need to know if you have any of these conditions: Blood disorders, such as hemophilia Fever or infection Immune system problems Neurologic disease Seizures An unusual or allergic reaction to other vaccines, latex, other medications, foods, dyes, or preservatives Pregnant or trying to get pregnant Breastfeeding How should I use this medication? This vaccine is injected into a muscle. It is given by your care team. A copy of Vaccine Information Statements will be given before each vaccination. Be sure to read this information carefully each time. This sheet may change often. Talk to your care team about the use of this medication in children. While the DTaP vaccine may be given to children as young as 6 weeks and the Tdap vaccine may be given to children as young as 45 years old, precautions do apply. Overdosage: If you think you have taken too much of this medicine contact a poison control center or emergency room at once. NOTE: This medicine is only for you. Do not share this medicine with others. What if I miss a dose? It is important not to miss your dose. Call your care team if you are unable to keep an  appointment. What may interact with this medication? This medication may interact with the following: Certain medications that prevent or treat blood clots, such as warfarin, enoxaparin, dalteparin Immune globulin Medications that lower your chance of fighting an infection, such as adalimumab, anakinra, infliximab Medications to treat cancer Steroid medications, such as prednisone or cortisone This list may not describe all possible interactions. Give your health care provider a list of all the medicines, herbs, non-prescription drugs, or dietary supplements you use. Also tell them if you smoke, drink alcohol, or use illegal drugs. Some items may interact with your medicine. What should I watch for while using this medication? See your care team for all shots of this vaccine as directed. Report any side effects to your care team right away. This vaccine, like all vaccines, may not fully protect everyone. What side effects may I notice from receiving this medication? Side effects that you should report to your care team as soon as possible: Allergic reactions--skin rash, itching, hives, swelling of the face, lips, tongue, or throat Feeling faint or lightheaded Side effects that usually do not require medical attention (report these to your care team if they continue or are bothersome): Chills Fever General discomfort and fatigue Headache Joint pain Muscle pain Pain, redness, or irritation at injection site This list may not describe all possible side effects. Call your doctor for medical advice about side effects. You may report side effects to FDA at 1-800-FDA-1088. Where should I keep my medication? This vaccine is only given by your care team. It will not be stored at home. NOTE: This  sheet is a summary. It may not cover all possible information. If you have questions about this medicine, talk to your doctor, pharmacist, or health care provider.  2023 Elsevier/Gold Standard  (2021-12-13 00:00:00)

## 2022-07-19 NOTE — Assessment & Plan Note (Addendum)
Increased Ozempic to 0.5 mg weekly. Urine microalbumin checked today.
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
## 2022-07-19 NOTE — Assessment & Plan Note (Signed)
ECHO result reviewed and discussed with her. Mild left ventricular hypertrophy in a patient with past hx of resistant HTN. No acute symptoms. F/U Cards as planned.
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
## 2022-07-20 LAB — TSH: TSH: 0.84 u[IU]/mL (ref 0.450–4.500)

## 2022-07-20 LAB — MICROALBUMIN / CREATININE URINE RATIO
Creatinine, Urine: 204 mg/dL
Microalb/Creat Ratio: 2 mg/g creat (ref 0–29)
Microalbumin, Urine: 5 ug/mL

## 2022-07-21 NOTE — Progress Notes (Signed)
Cardiology Office Note:    Date:  07/22/2022   ID:  Teresa [name], DOB 1961/05/20, MRN [id number]  PCP:  Kinnie Feil, MD   [facility] Providers Cardiologist:  None Sleep Medicine:  Shelva Majestic, MD     Referring MD: Kinnie Feil, MD   No chief complaint on file.   History of Present Illness:    Teresa [name] is a 62 y.o. female with a hx of hypertension, OSA on CPAP, hypothyroidism, stroke, palpitations, type 2 diabetes.  She was last seen in our office on 08/23/2021 by Dr. Oval Linsey, at that time her blood pressure had been under better control.  Discussions were held surrounding PCSK9 inhibitor and the patient wanted to think about it.  Was started on Zetia by her PCP however noticed that she was having worsening leg cramps and overall fatigue, onset of symptoms were congruent with when she started Zetia.  She was having some DOE at this time as well, overall, DOE was felt to be related to abdominal girth by the patient.  Echo was ordered which was improved from her previous echo.  She presents today for a follow-up visit.  She is visibly in pain, related to her left shoulder and left neck pain.  She has an upcoming MRI in February for further treatment recommendations of this pain.  From a cardiac perspective she is doing relatively well.  She has recently had to suspend her walking as she has had some issues with her right knee as well.  Her CPAP machine was finally straightened out and she endorses compliance with her CPAP, wearing it 90% of the time.  She has also made dietary changes and is trying to eat more fresh fruits and vegetables. She denies chest pain, palpitations, dyspnea, pnd, orthopnea, n, v, dizziness, syncope, edema, weight gain, or early satiety.  We discussed the need to recheck her cholesterol level, she was somewhat hesitant about this as she does not want to take any additional medications. She agrees to proceed with checking her lipid level, and  lipoprotein A.    Past Medical History:  Diagnosis Date   Allergy    Anemia    Cervical disc disorder with radiculopathy of cervical region 02/10/2016   Chronic lower back pain    Claudication in peripheral vascular disease ([place]) 06/12/2020   Diverticulosis    Ear discomfort 03/11/2016   EKG, abnormal 01/06/2017   Gastritis    GERD (gastroesophageal reflux disease)    H/O hiatal hernia    Hiatal hernia    History of amputation of lesser toe of left foot ([place]) 03/11/2015   History of colon cancer    Hypertension    Hyperthyroidism    hx   Hypothyroidism    Lateral knee pain, left 08/06/2013   Left shoulder pain 05/28/2015   Migraine headache    "a few times/yr" (03/05/2015)   Neck pain 02/11/2014   Neck strain 02/06/2012   Numbness and tingling in right hand 05/16/2018   Osteomyelitis ([place])    [name] 03/05/2015   Osteomyelitis of toe of left foot ([place]) 03/05/2015   Palpitations 01/05/2016   Positive TB test    Sleep apnea    Snoring 02/15/2021   Stroke ([place]) 08/2017   Throat pain 09/21/2011   Normal CT and US neck march 2013, repeat Neck US in 1 year to follow-up small nodules.  Head CT 2013: normal ? glosspharyngeal neuralgia.  On amitriptyline await [facility] ENt referral.    Tubular  adenoma of colon    Tubular adenoma of colon    Ulnar neuropathy at elbow of right upper extremity 06/12/2018    Past Surgical History:  Procedure Laterality Date   ABDOMINAL HYSTERECTOMY  2006   AMPUTATION TOE Left 03/05/2015   Procedure: AMPUTATION LEFT 5TH  TOE;  Surgeon: Wylene Simmer, MD;  Location: [facility];  Service: Orthopedics;  Laterality: Left;   APPENDECTOMY  2006   BREAST BIOPSY Left 11/26/2014   CESAREAN SECTION  1979 1986 1990   CHOLECYSTECTOMY     COLONOSCOPY     GALLBLADDER SURGERY     LOOP RECORDER INSERTION N/A 09/18/2017   Procedure: LOOP RECORDER INSERTION;  Surgeon: Evans Lance, MD;  Location: [facility];  Service: Cardiovascular;  Laterality: N/A;   OPEN  REDUCTION INTERNAL FIXATION (ORIF) DISTAL RADIAL FRACTURE Right 09/14/2019   Procedure: OPEN REDUCTION INTERNAL FIXATION (ORIF)  RADIAL AND ULNA FRACTURE;  Surgeon: Iran Planas, MD;  Location: [facility];  Service: Orthopedics;  Laterality: Right;   ORIF ULNAR FRACTURE Right 09/14/2019   TEE WITHOUT CARDIOVERSION N/A 09/18/2017   Procedure: TRANSESOPHAGEAL ECHOCARDIOGRAM (TEE);  Surgeon: Pixie Casino, MD;  [facility];  Service: Cardiovascular;  Laterality: N/A;   TOE AMPUTATION Left 03/05/2015   5th toe   TUBAL LIGATION Bilateral 1990   UPPER GASTROINTESTINAL ENDOSCOPY      Current Medications: Current Meds  Medication Sig   acetaminophen-codeine (TYLENOL #3) 300-30 MG tablet Take 1 tablet by mouth 2 (two) times daily as needed for moderate pain.   aspirin 81 MG chewable tablet Chew 1 tablet (81 mg total) by mouth daily.   carvedilol (COREG) 25 MG tablet TAKE 1 TABLET BY MOUTH TWICE DAILY WITH MEALS   CEQUA 0.09 % SOLN Apply 1 drop to eye 2 (two) times daily.   chlorthalidone (HYGROTON) 25 MG tablet Take 1 tablet by mouth once daily   hydrALAZINE (APRESOLINE) 100 MG tablet TAKE 1 TABLET BY MOUTH THREE TIMES DAILY   levothyroxine (SYNTHROID) 175 MCG tablet TAKE 1 TABLET BY MOUTH ONCE DAILY IN THE MORNING 30  MINUTES  BEFORE  FOOD   olopatadine (PATANOL) 0.1 % ophthalmic solution 1 drop 2 (two) times daily.   Semaglutide,0.25 or 0.'5MG'$ /DOS, 2 MG/1.5ML SOPN Inject 0.25 mg into the skin once a week. 0.25 mg once weekly for 4 weeks then increase to 0.5 mg weekly for at least 4 weeks,max 1 mg   spironolactone (ALDACTONE) 50 MG tablet Take 1 tablet by mouth once daily   tiZANidine (ZANAFLEX) 2 MG tablet Take 1 tablet (2 mg total) by mouth every 12 (twelve) hours as needed for muscle spasms.     Allergies:   Amlodipine, Bactrim [sulfamethoxazole-trimethoprim], Clonidine derivatives, Lisinopril, Statins, Aspirin, and Latex   Social History   Socioeconomic History   Marital status:  Single    Spouse name: Not on file   Number of children: 3   Years of education: Not on file   Highest education level: Not on file  Occupational History   Occupation: cosmetolgist  Tobacco Use   Smoking status: Former    Packs/day: 1.00    Years: 36.00    Total pack years: 36.00    Types: Cigarettes    Quit date: 09/23/2017    Years since quitting: 4.8   Smokeless tobacco: Never   Tobacco comments:    Tobacco info given 03/01/17  Vaping Use   Vaping Use: Never used  Substance and Sexual Activity   Alcohol use: Never  Drug use: Never   Sexual activity: Not Currently    Birth control/protection: Post-menopausal, Surgical  Other Topics Concern   Not on file  Social History Narrative   Lives with two adult children.  Another daughter lives in [place].  Cosmetologist.  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  Likes to fish and play bingo.  Goes to church.   Social Determinants of Health   Financial Resource Strain: Not on file  Food Insecurity: No Food Insecurity (05/02/2022)   Hunger Vital Sign    Worried About Running Out of Food in the Last Year: Never true    Ran Out of Food in the Last Year: Never true  Transportation Needs: No Transportation Needs (05/03/2022)   PRAPARE - Hydrologist (Medical): No    Lack of Transportation (Non-Medical): No  Physical Activity: Not on file  Stress: Not on file  Social Connections: Not on file     Family History: The patient's family history includes Breast cancer in her maternal aunt and paternal aunt; Cancer in her paternal uncle and sister; Cancer (age of onset: 46) in her father; Diabetes in her brother, mother, and sister; Heart disease in her mother; Kidney disease in her brother and sister; Pancreatic cancer in her sister; Stroke in her father. There is no history of Esophageal cancer, Rectal cancer, Stomach cancer, Thyroid disease, Colon cancer, or Colon polyps.  ROS:   Please see the history of present illness.     All other systems reviewed and are negative.  EKGs/Labs/Other Studies Reviewed:    The following studies were reviewed today:  07/15/2022 echo complete -EF 55 to 60%, no RWMA, mild LVH, grade 1 diastolic dysfunction, mitral valve is degenerative, trivial MR, mild AR  06/2020 renal artery dopplers - negative for stenosis  02/10/2020 echo complete - EF 55 to 60%, moderate LVH, grade I DD, LA mildly dilated, mild AR.  02/24/16 lexiscan -  Nuclear stress EF: 47% by computer calculations. Visually , the EF is normal and is around 55-60%. There was no ST segment deviation noted during stress. The study is normal. This is a low risk study.  11/11/15 Carotid US - bilateral 1-39% stenosis.    EKG:  EKG is ordered today.  The ekg ordered today demonstrates NSR, possible left atrium enlargement, abnormal EKG HR 82 bpm.   Recent Labs: 06/17/2022: ALT 44; BNP 13.4; BUN 20; Creatinine, Ser 1.16; Hemoglobin 13.2; Platelets 289; Potassium 4.1; Sodium 141 07/19/2022: TSH 0.840  Recent Lipid Panel    Component Value Date/Time   CHOL 135 04/05/2022 1004   TRIG 63 04/05/2022 1004   HDL 32 (L) 04/05/2022 1004   CHOLHDL 4.2 04/05/2022 1004   CHOLHDL 4.3 09/15/2017 1217   VLDL 13 09/15/2017 1217   LDLCALC 90 04/05/2022 1004     Risk Assessment/Calculations:                Physical Exam:    VS:  BP 122/82 (BP Location: Right Arm, Patient Position: Sitting, Cuff Size: Large)   Pulse 82   Ht '5\' 5"'$  (1.651 m)   Wt 207 lb 4.8 oz (94 kg)   BMI 34.50 kg/m     Wt Readings from Last 3 Encounters:  07/22/22 207 lb 4.8 oz (94 kg)  07/19/22 206 lb (93.4 kg)  07/08/22 213 lb (96.6 kg)     GEN:  Well nourished, well developed in no acute distress HEENT: Normal NECK: No JVD; No carotid bruits LYMPHATICS: No lymphadenopathy CARDIAC:  RRR, no murmurs, rubs, gallops RESPIRATORY:  Clear to auscultation without rales, wheezing or rhonchi  ABDOMEN: Soft, non-tender, non-distended MUSCULOSKELETAL:   No edema; No deformity  SKIN: Warm and dry NEUROLOGIC:  Alert and oriented x 3 PSYCHIATRIC:  Normal affect   ASSESSMENT:    1. Essential hypertension   2. CAD in native artery   3. Mixed hyperlipidemia   4. OSA (obstructive sleep apnea)   5. Cryptogenic stroke ([place])   6. Bilateral carotid artery disease, unspecified type ([place])    PLAN:    In order of problems listed above:  HTN - BP today 122/82 and is well controlled. Continue current antihypertensive regimen.  CAD - Denies CP or acute decompensation. Echo obtained by PCP on 07/20/22 for DOE (felt breathless when she bends over), is improved from previous echo. Continue ASA, carvedilol.   Mixed hyperlipidemia - LDL on 04/05/22 was 90. She was on Zetia but that was recently stopped by her PCP d/t leg cramping. Will repeat fasting lipid, CMET and lipoprotein A. Cannot tolerate statins. Discussed Repatha, she does not want to take another injectable. Hesitant to use bempedoic due to hx of gout.  OSA - Compliant with nasal CPAP, sleeping well and feels rested.   Cryptogenic stroke - s/p loop recorder removal, no arrhythmias were noted. On ASA. Considering cholesterol management.  Carotid artery disease - mild bilateral disease, last exam in 200[address], will repeat carotid dopplers.  Disposition - CMET, lipid, CMET, carotid US. If cholesterol is elevated, she will continue to think about management options.           Medication Adjustments/Labs and Tests Ordered: Current medicines are reviewed at length with the patient today.  Concerns regarding medicines are outlined above.  Orders Placed This Encounter  Procedures   Comprehensive metabolic panel   Lipid panel   Lipoprotein A (LPA)   EKG 12-Lead   VAS US CAROTID   No orders of the defined types were placed in this encounter.   Patient Instructions  Medication Instructions:  Your Physician recommend you continue on your current medication as directed.    *If you need a  refill on your cardiac medications before your next appointment, please call your pharmacy*   Lab Work: Please return for Lab work in the next week or so for Fasting Lipid Panel, Lpa, and CMP. You may come to the...   [facility] (3rd floor) [address] [place], [place] [address]  Open: 8am-Noon and 1pm-4:30pm  Please ring the doorbell on the small table when you exit the elevator and the Lab Tech will come get you  [place] at [facility] [address] [place], [place],  [address] Open: 8am-1pm, then 2pm-4:30pm   [place]- Please see attached locations sheet stapled to your lab work with address and hours.   If you have labs (blood work) drawn today and your tests are completely normal, you will receive your results only by: [place] (if you have MyChart) OR A paper copy in the mail If you have any lab test that is abnormal or we need to change your treatment, we will call you to review the results.   Testing/Procedures: Your physician has requested that you have a carotid duplex. This test is an ultrasound of the carotid arteries in your neck. It looks at blood flow through these arteries that supply the brain with blood. Allow one hour for this exam. There are no restrictions or special instructions.  Follow-Up: At  [facility], you and your health needs are our priority.  As part of our continuing mission to provide you with exceptional heart care, we have created designated Provider Care Teams.  These Care Teams include your primary Cardiologist (physician) and Advanced Practice Providers (APPs -  Physician Assistants and Nurse Practitioners) who all work together to provide you with the care you need, when you need it.  We recommend signing up for the patient portal called "MyChart".  Sign up information is provided on this After Visit Summary.  MyChart is used to connect with patients for Virtual Visits  (Telemedicine).  Patients are able to view lab/test results, encounter notes, upcoming appointments, etc.  Non-urgent messages can be sent to your provider as well.   To learn more about what you can do with MyChart, go to [email or web address].    Your next appointment:   6-8 month(s)  Provider:   Skeet Latch, MD    Other Instructions Heart Healthy Diet Recommendations: A low-salt diet is recommended. Meats should be grilled, baked, or boiled. Avoid fried foods. Focus on lean protein sources like fish or chicken with vegetables and fruits. The American Heart Association is a Microbiologist!  American Heart Association Diet and Lifeystyle Recommendations   Exercise recommendations: The American Heart Association recommends 150 minutes of moderate intensity exercise weekly. Try 30 minutes of moderate intensity exercise 4-5 times per week. This could include walking, jogging, or swimming.     Signed, Trudi Ida, NP  07/22/2022 2:35 PM    [place]

## 2022-07-22 ENCOUNTER — Ambulatory Visit (HOSPITAL_BASED_OUTPATIENT_CLINIC_OR_DEPARTMENT_OTHER): Payer: Medicare HMO | Admitting: Cardiology

## 2022-07-22 ENCOUNTER — Encounter (HOSPITAL_BASED_OUTPATIENT_CLINIC_OR_DEPARTMENT_OTHER): Payer: Self-pay | Admitting: Cardiology

## 2022-07-22 VITALS — BP 122/82 | HR 82 | Ht 65.0 in | Wt 207.3 lb

## 2022-07-22 DIAGNOSIS — E782 Mixed hyperlipidemia: Secondary | ICD-10-CM | POA: Diagnosis not present

## 2022-07-22 DIAGNOSIS — I10 Essential (primary) hypertension: Secondary | ICD-10-CM

## 2022-07-22 DIAGNOSIS — G4733 Obstructive sleep apnea (adult) (pediatric): Secondary | ICD-10-CM | POA: Diagnosis not present

## 2022-07-22 DIAGNOSIS — I779 Disorder of arteries and arterioles, unspecified: Secondary | ICD-10-CM | POA: Diagnosis not present

## 2022-07-22 DIAGNOSIS — I2510 Atherosclerotic heart disease of native coronary artery without angina pectoris: Secondary | ICD-10-CM

## 2022-07-22 DIAGNOSIS — I639 Cerebral infarction, unspecified: Secondary | ICD-10-CM | POA: Diagnosis not present

## 2022-07-22 NOTE — Patient Instructions (Signed)
Medication Instructions:  Your Physician recommend you continue on your current medication as directed.    *If you need a refill on your cardiac medications before your next appointment, please call your pharmacy*   Lab Work: Please return for Lab work in the next week or so for Fasting Lipid Panel, Lpa, and CMP. You may come to the...   [facility] (3rd floor) [address], [place], [place] [address]  Open: 8am-Noon and 1pm-4:30pm  Please ring the doorbell on the small table when you exit the elevator and the Lab Tech will come get you  [place] at [facility] [address] [place], [place], [place] [address] Open: 8am-1pm, then 2pm-4:30pm   [place]- Please see attached locations sheet stapled to your lab work with address and hours.   If you have labs (blood work) drawn today and your tests are completely normal, you will receive your results only by: [place] (if you have MyChart) OR A paper copy in the mail If you have any lab test that is abnormal or we need to change your treatment, we will call you to review the results.   Testing/Procedures: Your physician has requested that you have a carotid duplex. This test is an ultrasound of the carotid arteries in your neck. It looks at blood flow through these arteries that supply the brain with blood. Allow one hour for this exam. There are no restrictions or special instructions.  Follow-Up: At [facility], you and your health needs are our priority.  As part of our continuing mission to provide you with exceptional heart care, we have created designated Provider Care Teams.  These Care Teams include your primary Cardiologist (physician) and Advanced Practice Providers (APPs -  Physician Assistants and Nurse Practitioners) who all work together to provide you with the care you need, when you need it.  We recommend signing up for the patient portal called "MyChart".  Sign  up information is provided on this After Visit Summary.  MyChart is used to connect with patients for Virtual Visits (Telemedicine).  Patients are able to view lab/test results, encounter notes, upcoming appointments, etc.  Non-urgent messages can be sent to your provider as well.   To learn more about what you can do with MyChart, go to [email or web address].    Your next appointment:   6-8 month(s)  Provider:   Skeet Latch, MD    Other Instructions Heart Healthy Diet Recommendations: A low-salt diet is recommended. Meats should be grilled, baked, or boiled. Avoid fried foods. Focus on lean protein sources like fish or chicken with vegetables and fruits. The American Heart Association is a Microbiologist!  American Heart Association Diet and Lifeystyle Recommendations   Exercise recommendations: The American Heart Association recommends 150 minutes of moderate intensity exercise weekly. Try 30 minutes of moderate intensity exercise 4-5 times per week. This could include walking, jogging, or swimming.

## 2022-07-25 ENCOUNTER — Other Ambulatory Visit (HOSPITAL_COMMUNITY): Payer: Self-pay

## 2022-07-26 ENCOUNTER — Other Ambulatory Visit: Payer: Self-pay | Admitting: Family Medicine

## 2022-07-26 ENCOUNTER — Encounter: Payer: Self-pay | Admitting: Family Medicine

## 2022-07-26 NOTE — Telephone Encounter (Signed)
Spoke with patient. She has already had another H&P done and is within 30days of her scheduled MRI.

## 2022-07-29 ENCOUNTER — Encounter: Payer: Self-pay | Admitting: Orthopaedic Surgery

## 2022-07-29 NOTE — Telephone Encounter (Signed)
Lets start with these mris first

## 2022-08-02 ENCOUNTER — Ambulatory Visit (HOSPITAL_COMMUNITY)
Admission: RE | Admit: 2022-08-02 | Discharge: 2022-08-02 | Disposition: A | Discharge status: Home / Self Care | Payer: Medicare HMO | Source: Ambulatory Visit | Attending: Internal Medicine | Admitting: Internal Medicine

## 2022-08-02 DIAGNOSIS — E782 Mixed hyperlipidemia: Secondary | ICD-10-CM

## 2022-08-02 DIAGNOSIS — G4733 Obstructive sleep apnea (adult) (pediatric): Secondary | ICD-10-CM | POA: Insufficient documentation

## 2022-08-02 DIAGNOSIS — I10 Essential (primary) hypertension: Secondary | ICD-10-CM | POA: Diagnosis present

## 2022-08-02 DIAGNOSIS — I779 Disorder of arteries and arterioles, unspecified: Secondary | ICD-10-CM | POA: Insufficient documentation

## 2022-08-02 DIAGNOSIS — H02402 Unspecified ptosis of left eyelid: Secondary | ICD-10-CM | POA: Diagnosis not present

## 2022-08-03 LAB — COMPREHENSIVE METABOLIC PANEL WITH GFR
ALT: 25 IU/L (ref 0–32)
AST: 18 IU/L (ref 0–40)
Albumin/Globulin Ratio: 1.7 (ref 1.2–2.2)
Albumin: 4.5 g/dL (ref 3.8–4.9)
Alkaline Phosphatase: 88 IU/L (ref 44–121)
BUN/Creatinine Ratio: 17 (ref 12–28)
BUN: 17 mg/dL (ref 8–27)
Bilirubin Total: 0.3 mg/dL (ref 0.0–1.2)
CO2: 24 mmol/L (ref 20–29)
Calcium: 10.5 mg/dL — ABNORMAL HIGH (ref 8.7–10.3)
Chloride: 103 mmol/L (ref 96–106)
Creatinine, Ser: 1.01 mg/dL — ABNORMAL HIGH (ref 0.57–1.00)
Globulin, Total: 2.7 g/dL (ref 1.5–4.5)
Glucose: 91 mg/dL (ref 70–99)
Potassium: 3.8 mmol/L (ref 3.5–5.2)
Sodium: 142 mmol/L (ref 134–144)
Total Protein: 7.2 g/dL (ref 6.0–8.5)
eGFR: 64 mL/min/1.73

## 2022-08-03 LAB — LIPID PANEL
Chol/HDL Ratio: 4.2 ratio (ref 0.0–4.4)
Cholesterol, Total: 171 mg/dL (ref 100–199)
HDL: 41 mg/dL
LDL Chol Calc (NIH): 112 mg/dL — ABNORMAL HIGH (ref 0–99)
Triglycerides: 97 mg/dL (ref 0–149)
VLDL Cholesterol Cal: 18 mg/dL (ref 5–40)

## 2022-08-03 LAB — LIPOPROTEIN A (LPA): Lipoprotein (a): 164.8 nmol/L — ABNORMAL HIGH

## 2022-08-04 NOTE — Progress Notes (Signed)
Lipid results called to patient as per NP.  Pt verbalized understanding, appt scheduled with Pharm D lipid clinic March 4th, 2024 at 8:45. Pt aware.  Georgana Curio MHA RN CCM

## 2022-08-10 NOTE — Telephone Encounter (Signed)
I think best to still get mris that we have ordered and go from there as the right arm pain could also be coming from her neck

## 2022-08-15 ENCOUNTER — Encounter (HOSPITAL_COMMUNITY): Payer: Self-pay | Admitting: Orthopedic Surgery

## 2022-08-15 ENCOUNTER — Other Ambulatory Visit: Payer: Self-pay

## 2022-08-15 NOTE — Progress Notes (Signed)
Anesthesia Chart Review: Same-day workup  Follows cardiology for history of HTN, OSA on CPAP, palpitations, cryptogenic stroke (prior loop recorder did not reveal any arrhythmias).  Last seen by Venia Carbon, NP 07/22/2022.  She was noted to be doing well from cardiac standpoint.  Her biggest issue was noted to be significant left shoulder and left neck pain.  It was discussed that she had upcoming MRI for further treatment recommendations for this pain.  Patient was seen by her PCP Dr. Gwendlyn Deutscher 07/19/2022 for H&P prior to MRI under anesthesia.  No acute issues at that time.  Compliant with CPAP.  She was recently started on once weekly GLP-1 agonist Ozempic for new diagnosis of DM2, she is currently on a titration schedule, administering 0.5 mg once weekly, not yet at the max dose of 1 mg weekly.  A1C 6.5 on 07/08/2022. Patient stated she was not advised by her PCP or ordering provider to hold her GLP-1 agonist prior to procedure.  She took her last dose on 08/15/2022.  Hiatal hernia listed in patient history, however, review of recent CT scans of chest and abdomen shows no mention of hiatal hernia.  I cannot find any documentation to corroborate presence of hiatal hernia.  CMP and CBC from 08/02/2022 reviewed, unremarkable.  Patient will need day of surgery evaluation.  EKG 07/22/2022: NSR.  Rate 82.  Possible LAE.  Nonspecific T wave abnormality.  TTE 07/15/2022:  1. Left ventricular ejection fraction, by estimation, is 55 to 60%. Left  ventricular ejection fraction by 3D volume is 56 %. The left ventricle has  normal function. The left ventricle has no regional Manard motion  abnormalities. There is mild left  ventricular hypertrophy. Left ventricular diastolic parameters are  consistent with Grade I diastolic dysfunction (impaired relaxation).   2. Right ventricular systolic function is normal. The right ventricular  size is normal. Tricuspid regurgitation signal is inadequate for assessing  PA  pressure.   3. The mitral valve is degenerative. Trivial mitral valve regurgitation.  No evidence of mitral stenosis.   4. The aortic valve is grossly normal. Aortic valve regurgitation is  mild. No aortic stenosis is present.   5. The inferior vena cava is normal in size with greater than 50%  respiratory variability, suggesting right atrial pressure of 3 mmHg.   Nuclear stress 02/22/2021:   The study is normal. The study is low risk.   down sloping ST depression (II, III, aVF, V4, V5 and V6) was noted.   LV perfusion is normal.   Nuclear stress EF: 55 %. The left ventricular ejection fraction is normal (55-65%). Left ventricular function is normal. End diastolic cavity size is normal. End systolic cavity size is normal.   Normal perfusion. LVEF 55% with normal Kines motion. This is a low risk study.  Compared to a prior study in 2017, the LVEF has improved.     Wynonia Musty [facility] Short Stay Center/Anesthesiology Phone [phone] 08/15/2022 4:47 PM

## 2022-08-15 NOTE — Progress Notes (Signed)
PCP - Kinnie Feil, MD Cardiologist - Skeet Latch, MD  EKG - 07/22/22 ECHO - 07/15/22  CPAP - Wears all but one night a week  OZEMPIC last dose: 08/15/22  ERAS Protcol - Clears until 0500  Anesthesia review: N  Patient verbally denies any shortness of breath, fever, cough and chest pain during phone call   -------------  SDW INSTRUCTIONS given:  Your procedure is scheduled on 08/16/22.  Report to [facility] Main Entrance "A" at 0530 A.M., and check in at the Admitting office.  Call this number if you have problems the morning of surgery:  [phone]   Remember:  Do not eat after midnight the night before your surgery  You may drink clear liquids until 0500 the morning of your surgery.   Clear liquids allowed are: Water, Non-Citrus Juices (without pulp), Carbonated Beverages, Clear Tea, Black Coffee Only, and Gatorade    Take these medicines the morning of surgery with A SIP OF WATER  REFRESH  carvedilol (COREG)  CEQUA  hydrALAZINE (APRESOLINE)  levothyroxine (SYNTHROID)  acetaminophen-codeine (TYLENOL #3)-if needed tiZANidine (ZANAFLEX)-if needed  As of today, STOP taking any Aspirin (unless otherwise instructed by your surgeon) Aleve, Naproxen, Ibuprofen, Motrin, Advil, Goody's, BC's, all herbal medications, fish oil, and all vitamins.                      Do not wear jewelry, make up, or nail polish            Do not wear lotions, powders, perfumes/colognes, or deodorant.            Do not shave 48 hours prior to surgery.  Men may shave face and neck.            Do not bring valuables to the hospital.            [facility] is not responsible for any belongings or valuables.  Do NOT Smoke (Tobacco/Vaping) 24 hours prior to your procedure If you use a CPAP at night, you may bring all equipment for your overnight stay.   Contacts, glasses, dentures or bridgework may not be worn into surgery.      For patients admitted to the hospital, discharge time  will be determined by your treatment team.   Patients discharged the day of surgery will not be allowed to drive home, and someone needs to stay with them for 24 hours.    Special instructions:   [place]- Preparing For Surgery  Before surgery, you can play an important role. Because skin is not sterile, your skin needs to be as free of germs as possible. You can reduce the number of germs on your skin by washing with CHG (chlorahexidine gluconate) Soap before surgery.  CHG is an antiseptic cleaner which kills germs and bonds with the skin to continue killing germs even after washing.    Oral Hygiene is also important to reduce your risk of infection.  Remember - BRUSH YOUR TEETH THE MORNING OF SURGERY WITH YOUR REGULAR TOOTHPASTE  Please do not use if you have an allergy to CHG or antibacterial soaps. If your skin becomes reddened/irritated stop using the CHG.  Do not shave (including legs and underarms) for at least 48 hours prior to first CHG shower. It is OK to shave your face.  Please follow these instructions carefully.   Shower the NIGHT BEFORE SURGERY and the MORNING OF SURGERY with DIAL Soap.   Pat yourself dry with a  CLEAN TOWEL.  Wear CLEAN PAJAMAS to bed the night before surgery  Place CLEAN SHEETS on your bed the night of your first shower and DO NOT SLEEP WITH PETS.   Day of Surgery: Please shower morning of surgery  Wear Clean/Comfortable clothing the morning of surgery Do not apply any deodorants/lotions.   Remember to brush your teeth WITH YOUR REGULAR TOOTHPASTE.   Questions were answered. Patient verbalized understanding of instructions.

## 2022-08-15 NOTE — Anesthesia Preprocedure Evaluation (Signed)
Anesthesia Evaluation  Patient identified by MRN, date of birth, ID band Patient awake    Reviewed: Allergy & Precautions, NPO status , Patient's Chart, lab work & pertinent test results, reviewed documented beta blocker date and time   Airway Mallampati: IV  TM Distance: >3 FB Neck ROM: Full    Dental  (+) Teeth Intact, Dental Advisory Given   Pulmonary sleep apnea and Continuous Positive Airway Pressure Ventilation , former smoker 36 pack year history    Pulmonary exam normal breath sounds clear to auscultation       Cardiovascular hypertension (134/80 preop), Pt. on medications and Pt. on home beta blockers + CAD, + Peripheral Vascular Disease and +CHF  Normal cardiovascular exam Rhythm:Regular Rate:Normal   TTE 07/15/2022: 1. Left ventricular ejection fraction, by estimation, is 55 to 60%. Left  ventricular ejection fraction by 3D volume is 56 %. The left ventricle has  normal function. The left ventricle has no regional Vester motion  abnormalities. There is mild left  ventricular hypertrophy. Left ventricular diastolic parameters are  consistent with Grade I diastolic dysfunction (impaired relaxation).  2. Right ventricular systolic function is normal. The right ventricular  size is normal. Tricuspid regurgitation signal is inadequate for assessing  PA pressure.  3. The mitral valve is degenerative. Trivial mitral valve regurgitation.  No evidence of mitral stenosis.  4. The aortic valve is grossly normal. Aortic valve regurgitation is  mild. No aortic stenosis is present.  5. The inferior vena cava is normal in size with greater than 50%  respiratory variability, suggesting right atrial pressure of 3 mmHg.   Nuclear stress 02/22/2021:  The study is normal. The study is low risk.  down sloping ST depression (II, III, aVF, V4, V5 and V6) was noted.  LV perfusion is normal.  Nuclear stress EF: 55 %. The left  ventricular ejection fraction is normal (55-65%). Left ventricular function is normal. End diastolic cavity size is normal. End systolic cavity size is normal.  Normal perfusion. LVEF 55% with normal Vantrease motion. This is a low risk study. Compared to a prior study in 2017, the LVEF has improved.   Neuro/Psych  Headaches CVA (2019)  negative psych ROS   GI/Hepatic Neg liver ROS, hiatal hernia,GERD  Controlled,,  Endo/Other  diabetes, Well Controlled, Type 2Hypothyroidism    Renal/GU negative Renal ROS  negative genitourinary   Musculoskeletal negative musculoskeletal ROS (+)    Abdominal  (+) + obese  Peds  Hematology negative hematology ROS (+)   Anesthesia Other Findings Semaglutide LD yesterday- per pt was not told she had to stop this medication  Reproductive/Obstetrics negative OB ROS                             Anesthesia Physical Anesthesia Plan  ASA: 3  Anesthesia Plan: MAC   Post-op Pain Management:    Induction:   PONV Risk Score and Plan: 2 and Treatment may vary due to age or medical condition and Midazolam  Airway Management Planned:   Additional Equipment: None  Intra-op Plan:   Post-operative Plan:   Informed Consent: I have reviewed the patients History and Physical, chart, labs and discussed the procedure including the risks, benefits and alternatives for the proposed anesthesia with the patient or authorized representative who has indicated his/her understanding and acceptance.     Dental advisory given  Plan Discussed with: CRNA  Anesthesia Plan Comments: (D/w patient that she is at high  risk of aspiration as she has not held her GLP medication, however she is very anxious to proceed. Upon further discussion, she has never tried benzos/anxiolysis prior to MRI. We will administer very light anxiolysis and attempt MRI. If she is unable to tolerate this, we will proceed with GA/ETT/RSI. Discussed extensively with  patient and her family member.   )        Anesthesia Quick Evaluation

## 2022-08-16 ENCOUNTER — Ambulatory Visit (HOSPITAL_COMMUNITY): Payer: Medicare HMO | Admitting: Certified Registered Nurse Anesthetist

## 2022-08-16 ENCOUNTER — Encounter (HOSPITAL_COMMUNITY): Payer: Self-pay | Admitting: Orthopaedic Surgery

## 2022-08-16 ENCOUNTER — Encounter (HOSPITAL_COMMUNITY)
Admission: RE | Disposition: A | Discharge status: Home / Self Care | Payer: Self-pay | Source: Home / Self Care | Attending: Orthopaedic Surgery

## 2022-08-16 ENCOUNTER — Ambulatory Visit (HOSPITAL_COMMUNITY)
Admission: RE | Admit: 2022-08-16 | Discharge: 2022-08-16 | Disposition: A | Discharge status: Home / Self Care | Payer: Medicare HMO | Attending: Orthopaedic Surgery | Admitting: Orthopaedic Surgery

## 2022-08-16 ENCOUNTER — Ambulatory Visit (HOSPITAL_COMMUNITY)
Admission: RE | Admit: 2022-08-16 | Discharge: 2022-08-16 | Disposition: A | Discharge status: Home / Self Care | Payer: Medicare HMO | Source: Ambulatory Visit | Attending: Physician Assistant | Admitting: Orthopaedic Surgery

## 2022-08-16 ENCOUNTER — Ambulatory Visit (HOSPITAL_BASED_OUTPATIENT_CLINIC_OR_DEPARTMENT_OTHER): Payer: Medicare HMO | Admitting: Certified Registered Nurse Anesthetist

## 2022-08-16 ENCOUNTER — Ambulatory Visit (HOSPITAL_COMMUNITY)
Admission: RE | Admit: 2022-08-16 | Discharge: 2022-08-16 | Disposition: A | Discharge status: Home / Self Care | Payer: Medicare HMO | Source: Ambulatory Visit | Attending: Physician Assistant | Admitting: Physician Assistant

## 2022-08-16 DIAGNOSIS — E039 Hypothyroidism, unspecified: Secondary | ICD-10-CM | POA: Diagnosis not present

## 2022-08-16 DIAGNOSIS — M25512 Pain in left shoulder: Secondary | ICD-10-CM | POA: Diagnosis not present

## 2022-08-16 DIAGNOSIS — G8929 Other chronic pain: Secondary | ICD-10-CM

## 2022-08-16 DIAGNOSIS — M5021 Other cervical disc displacement,  high cervical region: Secondary | ICD-10-CM | POA: Diagnosis not present

## 2022-08-16 DIAGNOSIS — M5022 Other cervical disc displacement, mid-cervical region, unspecified level: Secondary | ICD-10-CM | POA: Diagnosis not present

## 2022-08-16 DIAGNOSIS — I110 Hypertensive heart disease with heart failure: Secondary | ICD-10-CM

## 2022-08-16 DIAGNOSIS — M67814 Other specified disorders of tendon, left shoulder: Secondary | ICD-10-CM | POA: Insufficient documentation

## 2022-08-16 DIAGNOSIS — Z8673 Personal history of transient ischemic attack (TIA), and cerebral infarction without residual deficits: Secondary | ICD-10-CM | POA: Insufficient documentation

## 2022-08-16 DIAGNOSIS — M50221 Other cervical disc displacement at C4-C5 level: Secondary | ICD-10-CM | POA: Diagnosis not present

## 2022-08-16 DIAGNOSIS — M4802 Spinal stenosis, cervical region: Secondary | ICD-10-CM | POA: Insufficient documentation

## 2022-08-16 DIAGNOSIS — I2510 Atherosclerotic heart disease of native coronary artery without angina pectoris: Secondary | ICD-10-CM

## 2022-08-16 DIAGNOSIS — M50222 Other cervical disc displacement at C5-C6 level: Secondary | ICD-10-CM | POA: Diagnosis not present

## 2022-08-16 DIAGNOSIS — Z87891 Personal history of nicotine dependence: Secondary | ICD-10-CM

## 2022-08-16 DIAGNOSIS — K449 Diaphragmatic hernia without obstruction or gangrene: Secondary | ICD-10-CM | POA: Insufficient documentation

## 2022-08-16 DIAGNOSIS — I5030 Unspecified diastolic (congestive) heart failure: Secondary | ICD-10-CM | POA: Insufficient documentation

## 2022-08-16 DIAGNOSIS — Z9989 Dependence on other enabling machines and devices: Secondary | ICD-10-CM | POA: Diagnosis not present

## 2022-08-16 DIAGNOSIS — M542 Cervicalgia: Secondary | ICD-10-CM | POA: Diagnosis not present

## 2022-08-16 DIAGNOSIS — I509 Heart failure, unspecified: Secondary | ICD-10-CM | POA: Diagnosis not present

## 2022-08-16 DIAGNOSIS — K219 Gastro-esophageal reflux disease without esophagitis: Secondary | ICD-10-CM | POA: Insufficient documentation

## 2022-08-16 DIAGNOSIS — E1151 Type 2 diabetes mellitus with diabetic peripheral angiopathy without gangrene: Secondary | ICD-10-CM | POA: Diagnosis not present

## 2022-08-16 DIAGNOSIS — Z7985 Long-term (current) use of injectable non-insulin antidiabetic drugs: Secondary | ICD-10-CM | POA: Insufficient documentation

## 2022-08-16 DIAGNOSIS — G4730 Sleep apnea, unspecified: Secondary | ICD-10-CM | POA: Diagnosis not present

## 2022-08-16 DIAGNOSIS — E119 Type 2 diabetes mellitus without complications: Secondary | ICD-10-CM | POA: Insufficient documentation

## 2022-08-16 DIAGNOSIS — Z09 Encounter for follow-up examination after completed treatment for conditions other than malignant neoplasm: Secondary | ICD-10-CM | POA: Diagnosis not present

## 2022-08-16 DIAGNOSIS — S46012A Strain of muscle(s) and tendon(s) of the rotator cuff of left shoulder, initial encounter: Secondary | ICD-10-CM | POA: Diagnosis not present

## 2022-08-16 DIAGNOSIS — G4733 Obstructive sleep apnea (adult) (pediatric): Secondary | ICD-10-CM | POA: Diagnosis not present

## 2022-08-16 HISTORY — PX: RADIOLOGY WITH ANESTHESIA: SHX6223

## 2022-08-16 LAB — GLUCOSE, CAPILLARY
Glucose-Capillary: 119 mg/dL — ABNORMAL HIGH (ref 70–99)
Glucose-Capillary: 95 mg/dL (ref 70–99)

## 2022-08-16 SURGERY — MRI WITH ANESTHESIA
Anesthesia: General | Laterality: Left

## 2022-08-16 MED ORDER — MIDAZOLAM HCL 2 MG/2ML IJ SOLN
INTRAMUSCULAR | Status: DC | PRN
  Administered 2022-08-16: 2 mg via INTRAVENOUS

## 2022-08-16 MED ORDER — CHLORHEXIDINE GLUCONATE 0.12 % MT SOLN
OROMUCOSAL | Status: AC
  Administered 2022-08-16: 15 mL via OROMUCOSAL
  Filled 2022-08-16: qty 15

## 2022-08-16 MED ORDER — CHLORHEXIDINE GLUCONATE 0.12 % MT SOLN
15.0000 mL | OROMUCOSAL | Status: AC
  Filled 2022-08-16: qty 15

## 2022-08-16 MED ORDER — MIDAZOLAM HCL 2 MG/2ML IJ SOLN
INTRAMUSCULAR | Status: AC
  Filled 2022-08-16: qty 2

## 2022-08-16 MED ORDER — LACTATED RINGERS IV SOLN: INTRAVENOUS | Status: DC

## 2022-08-16 NOTE — Progress Notes (Signed)
Anesthesia made aware of ozempic yesterday.

## 2022-08-16 NOTE — Anesthesia Postprocedure Evaluation (Signed)
Anesthesia Post Note  Patient: Teresa [name]  Procedure(s) Performed: MRI WITH LEFT SHOULDER WITHOUT CONTRAST,CERVICAL SPINE WITHOUT (Left)     Patient location during evaluation: PACU Anesthesia Type: MAC Level of consciousness: awake and alert, oriented and patient cooperative Pain management: pain level controlled Vital Signs Assessment: post-procedure vital signs reviewed and stable Respiratory status: spontaneous breathing, nonlabored ventilation and respiratory function stable Cardiovascular status: blood pressure returned to baseline and stable Postop Assessment: no apparent nausea or vomiting Anesthetic complications: no Comments: Pt tolerated very light anxiolysis, discussed w/ her in PACU that she does not need anesthesia for future scans and that the ordering provider of her scans should provide an Rx for a one-time ativan to be taken before scans.   No notable events documented.  Last Vitals:  Vitals:   08/16/22 0900 08/16/22 0915  BP: 128/77 135/72  Pulse: 67 66  Resp: 12 11  Temp:  36.8 C  SpO2: 98% 97%    Last Pain:  Vitals:   08/16/22 0915  TempSrc:   PainSc: [place]

## 2022-08-16 NOTE — Transfer of Care (Signed)
Immediate Anesthesia Transfer of Care Note  Patient: Teresa [name]  Procedure(s) Performed: MRI WITH LEFT SHOULDER WITHOUT CONTRAST,CERVICAL SPINE WITHOUT (Left)  Patient Location: PACU  Anesthesia Type:MAC  Level of Consciousness: awake, alert , and oriented  Airway & Oxygen Therapy: Patient Spontanous Breathing  Post-op Assessment: Report given to RN and Post -op Vital signs reviewed and stable  Post vital signs: Reviewed and stable  Last Vitals:  Vitals Value Taken Time  BP 130/89 08/16/22 0851  Temp    Pulse 67 08/16/22 0852  Resp 14 08/16/22 0852  SpO2 96 % 08/16/22 0852  Vitals shown include unvalidated device data.  Last Pain:  Vitals:   08/16/22 0646  TempSrc:   PainSc: 3       Patients Stated Pain Goal: 0 ([phone] [id number])  Complications: No notable events documented.

## 2022-08-17 ENCOUNTER — Encounter (HOSPITAL_COMMUNITY): Payer: Self-pay | Admitting: Radiology

## 2022-08-17 NOTE — Progress Notes (Signed)
F/u to discuss

## 2022-08-19 ENCOUNTER — Ambulatory Visit (INDEPENDENT_AMBULATORY_CARE_PROVIDER_SITE_OTHER): Payer: Medicare HMO | Admitting: Orthopaedic Surgery

## 2022-08-19 DIAGNOSIS — M25512 Pain in left shoulder: Secondary | ICD-10-CM

## 2022-08-19 DIAGNOSIS — G8929 Other chronic pain: Secondary | ICD-10-CM

## 2022-08-19 NOTE — Progress Notes (Signed)
Office Visit Note   Patient: Teresa [name]           Date of Birth: 07-Sep-1960           MRN: [id number] Visit Date: 08/19/2022              Requested by: Kinnie Feil, MD [address] [place],  [place] [address] PCP: Kinnie Feil, MD   Assessment & Plan: Visit Diagnoses:  1. Chronic left shoulder pain     Plan: Left shoulder MRI shows severe tendinosis of the supraspinatus and moderate tendinosis of the infraspinatus.  No full-thickness defects.  C-spine MRI findings reviewed with the patient which could also cause patient's symptoms.  Based on her options of trying shoulder injections or epidural injections she is elected to try bilateral glenohumeral shoulder injections first.  If she does not feel any improvement we will make a referral to Dr. Ernestina Patches for epidural steroid injections.  Follow-Up Instructions: No follow-ups on file.   Orders:  Orders Placed This Encounter  Procedures   AMB referral to sports medicine   No orders of the defined types were placed in this encounter.     Procedures: No procedures performed   Clinical Data: No additional findings.   Subjective: Chief Complaint  Patient presents with   Left Shoulder - Follow-up   Neck - Follow-up    HPI  Teresa [name] returns today for discussion of C-spine MRI and left shoulder MRI.  Reports that her right shoulder has started to hurt as well and her left shoulder actually has started to feel better.  Review of Systems  Constitutional: Negative.   HENT: Negative.    Eyes: Negative.   Respiratory: Negative.    Cardiovascular: Negative.   Endocrine: Negative.   Musculoskeletal: Negative.   Neurological: Negative.   Hematological: Negative.   Psychiatric/Behavioral: Negative.    All other systems reviewed and are negative.    Objective: Vital Signs: There were no vitals taken for this visit.  Physical Exam Vitals and nursing note reviewed.  Constitutional:      Appearance:  She is well-developed.  HENT:     Head: Normocephalic and atraumatic.  Pulmonary:     Effort: Pulmonary effort is normal.  Abdominal:     Palpations: Abdomen is soft.  Musculoskeletal:     Cervical back: Neck supple.  Skin:    General: Skin is warm.     Capillary Refill: Capillary refill takes less than 2 seconds.  Neurological:     Mental Status: She is alert and oriented to person, place, and time.  Psychiatric:        Behavior: Behavior normal.        Thought Content: Thought content normal.        Judgment: Judgment normal.     Ortho Exam  Examination is unchanged.  Specialty Comments:  No specialty comments available.  Imaging: No results found.   PMFS History: Patient Active Problem List   Diagnosis Date Noted   Diabetes mellitus, new onset ([place]) 07/11/2022   Eustachian tube dysfunction, left 05/03/2022   Vitreous flashes of both eyes 11/26/2021   Obstructive sleep apnea 07/05/2021   Bertolotti's syndrome 05/26/2021   Cervical radiculopathy 05/11/2021   Fatigue 05/11/2021   CAD in native artery 01/12/2021   Obese 01/11/2021   Peripheral vascular disease ([place]) 06/12/2020   History of forearm fracture 05/01/2020   History of hiatal hernia 05/01/2020   CKD (chronic kidney disease), stage II 01/15/2020  Tubular adenoma of colon 09/14/2019   Chronic diastolic CHF (congestive heart failure) ([place]) 09/14/2019   COVID-19 07/02/2019   Ulnar neuropathy at elbow of right upper extremity 06/12/2018   Migraine 01/12/2018   Hyperlipidemia    History of CVA (cerebrovascular accident) 09/15/2017   History of cholecystectomy 06/27/2017   Small intestinal bacterial overgrowth 03/23/2017   Paresthesia 01/06/2017   Tendinopathy of left rotator cuff 10/27/2015   Left shoulder pain 05/28/2015   History of amputation of lesser toe, left ([place]) 03/11/2015   Resistant hypertension    Thyroid nodule 02/12/2013   GERD (gastroesophageal reflux disease) 02/06/2012   History of  tobacco abuse 09/21/2011   Hypertension 09/21/2011   Hypothyroidism 09/21/2011   Past Medical History:  Diagnosis Date   Allergy    Anemia    Cervical disc disorder with radiculopathy of cervical region 02/10/2016   Chronic lower back pain    Claudication in peripheral vascular disease ([place]) 06/12/2020   Diverticulosis    Ear discomfort 03/11/2016   EKG, abnormal 01/06/2017   Gastritis    GERD (gastroesophageal reflux disease)    history   Graves' disease    2005   H/O hiatal hernia    Hiatal hernia    History of amputation of lesser toe of left foot ([place]) 03/11/2015   History of colon cancer    Hypertension    Hyperthyroidism    hx 2005   Hypothyroidism    Lateral knee pain, left 08/06/2013   Left shoulder pain 05/28/2015   Migraine headache    "a few times/yr" (03/05/2015)   Neck pain 02/11/2014   Neck strain 02/06/2012   Numbness and tingling in right hand 05/16/2018   Osteomyelitis ([place])    [name] 03/05/2015   Osteomyelitis of toe of left foot ([place]) 03/05/2015   Palpitations 01/05/2016   Positive TB test    1980's had a reaction to TB test   Sleep apnea    Snoring 02/15/2021   Stroke ([place]) 08/2017   Throat pain 09/21/2011   Normal CT and US neck march 2013, repeat Neck US in 1 year to follow-up small nodules.  Head CT 2013: normal ? glosspharyngeal neuralgia.  On amitriptyline await [facility] ENt referral.    Tubular adenoma of colon    Tubular adenoma of colon    Ulnar neuropathy at elbow of right upper extremity 06/12/2018    Family History  Problem Relation Age of Onset   Heart disease Mother        CAB age 78   Diabetes Mother    Cancer Father 19       pancreatic cancer   Stroke Father    Pancreatic cancer Sister    Diabetes Sister    Kidney disease Sister        renal failure   Diabetes Brother    Kidney disease Brother        renal failure   Breast cancer Maternal Aunt        per pt aunts and uncles died of cancer not sure the type   Breast cancer  Paternal Aunt    Cancer Paternal Uncle    Cancer Sister    Esophageal cancer Neg Hx    Rectal cancer Neg Hx    Stomach cancer Neg Hx    Thyroid disease Neg Hx    Colon cancer Neg Hx    Colon polyps Neg Hx     Past Surgical History:  Procedure Laterality Date   ABDOMINAL HYSTERECTOMY  2006   AMPUTATION TOE Left 03/05/2015   Procedure: AMPUTATION LEFT 5TH  TOE;  Surgeon: Wylene Simmer, MD;  Location: [place] [facility];  Service: Orthopedics;  Laterality: Left;   APPENDECTOMY  2006   BREAST BIOPSY Left 11/26/2014   CESAREAN SECTION  1979 1986 1990   CHOLECYSTECTOMY     COLONOSCOPY     GALLBLADDER SURGERY     LOOP RECORDER INSERTION N/A 09/18/2017   Procedure: LOOP RECORDER INSERTION;  Surgeon: Evans Lance, MD;  Location: [facility];  Service: Cardiovascular;  Laterality: N/A;   OPEN REDUCTION INTERNAL FIXATION (ORIF) DISTAL RADIAL FRACTURE Right 09/14/2019   Procedure: OPEN REDUCTION INTERNAL FIXATION (ORIF)  RADIAL AND ULNA FRACTURE;  Surgeon: Iran Planas, MD;  Location: [facility];  Service: Orthopedics;  Laterality: Right;   ORIF ULNAR FRACTURE Right 09/14/2019   RADIOLOGY WITH ANESTHESIA Left 08/16/2022   Procedure: MRI WITH LEFT SHOULDER WITHOUT CONTRAST,CERVICAL SPINE WITHOUT;  Surgeon: Radiologist, Medication, MD;  Location: [facility];  Service: Radiology;  Laterality: Left;   TEE WITHOUT CARDIOVERSION N/A 09/18/2017   Procedure: TRANSESOPHAGEAL ECHOCARDIOGRAM (TEE);  Surgeon: Pixie Casino, MD;  Location[facility];  Service: Cardiovascular;  Laterality: N/A;   TOE AMPUTATION Left 03/05/2015   5th toe   TUBAL LIGATION Bilateral 1990   UPPER GASTROINTESTINAL ENDOSCOPY     Social History   Occupational History   Occupation: cosmetolgist  Tobacco Use   Smoking status: Former    Packs/day: 1.00    Years: 36.00    Total pack years: 36.00    Types: Cigarettes    Quit date: 09/23/2017    Years since quitting: 4.9   Smokeless tobacco: Never   Tobacco comments:    Tobacco info  given 03/01/17  Vaping Use   Vaping Use: Never used  Substance and Sexual Activity   Alcohol use: Never   Drug use: Never   Sexual activity: Not Currently    Birth control/protection: Post-menopausal, Surgical

## 2022-08-23 ENCOUNTER — Other Ambulatory Visit: Payer: Self-pay | Admitting: Cardiovascular Disease

## 2022-08-23 ENCOUNTER — Encounter: Payer: Self-pay | Admitting: Cardiovascular Disease

## 2022-08-23 ENCOUNTER — Telehealth: Payer: Self-pay | Admitting: Cardiovascular Disease

## 2022-08-23 DIAGNOSIS — G4733 Obstructive sleep apnea (adult) (pediatric): Secondary | ICD-10-CM

## 2022-08-23 NOTE — Telephone Encounter (Signed)
Pt called in stating "I changed insurance and they are taking my cpap away". Please advise.

## 2022-08-23 NOTE — H&P (Signed)
PREOPERATIVE H&P  Chief Complaint: NECK PAIN, CHRONIC LEFT SHOULDER PAIN  HPI: Teresa [name] is a 62 y.o. female who presents for surgical treatment of NECK PAIN, CHRONIC LEFT SHOULDER PAIN.  She denies any changes in medical history.  Past Medical History:  Diagnosis Date   Allergy    Anemia    Cervical disc disorder with radiculopathy of cervical region 02/10/2016   Chronic lower back pain    Claudication in peripheral vascular disease ([place]) 06/12/2020   Diverticulosis    Ear discomfort 03/11/2016   EKG, abnormal 01/06/2017   Gastritis    GERD (gastroesophageal reflux disease)    history   Graves' disease    2005   H/O hiatal hernia    Hiatal hernia    History of amputation of lesser toe of left foot ([place]) 03/11/2015   History of colon cancer    Hypertension    Hyperthyroidism    hx 2005   Hypothyroidism    Lateral knee pain, left 08/06/2013   Left shoulder pain 05/28/2015   Migraine headache    "a few times/yr" (03/05/2015)   Neck pain 02/11/2014   Neck strain 02/06/2012   Numbness and tingling in right hand 05/16/2018   Osteomyelitis ([place])    [name] 03/05/2015   Osteomyelitis of toe of left foot ([place]) 03/05/2015   Palpitations 01/05/2016   Positive TB test    1980's had a reaction to TB test   Sleep apnea    Snoring 02/15/2021   Stroke ([place]) 08/2017   Throat pain 09/21/2011   Normal CT and US neck march 2013, repeat Neck US in 1 year to follow-up small nodules.  Head CT 2013: normal ? glosspharyngeal neuralgia.  On amitriptyline await [facility] ENt referral.    Tubular adenoma of colon    Tubular adenoma of colon    Ulnar neuropathy at elbow of right upper extremity 06/12/2018   Past Surgical History:  Procedure Laterality Date   ABDOMINAL HYSTERECTOMY  2006   AMPUTATION TOE Left 03/05/2015   Procedure: AMPUTATION LEFT 5TH  TOE;  Surgeon: Wylene Simmer, MD;  Location: [facility];  Service: Orthopedics;  Laterality: Left;   APPENDECTOMY  2006   BREAST BIOPSY Left  11/26/2014   CESAREAN SECTION  1979 1986 1990   CHOLECYSTECTOMY     COLONOSCOPY     GALLBLADDER SURGERY     LOOP RECORDER INSERTION N/A 09/18/2017   Procedure: LOOP RECORDER INSERTION;  Surgeon: Evans Lance, MD;  Location: [facility];  Service: Cardiovascular;  Laterality: N/A;   OPEN REDUCTION INTERNAL FIXATION (ORIF) DISTAL RADIAL FRACTURE Right 09/14/2019   Procedure: OPEN REDUCTION INTERNAL FIXATION (ORIF)  RADIAL AND ULNA FRACTURE;  Surgeon: Iran Planas, MD;  Location: [facility];  Service: Orthopedics;  Laterality: Right;   ORIF ULNAR FRACTURE Right 09/14/2019   RADIOLOGY WITH ANESTHESIA Left 08/16/2022   Procedure: MRI WITH LEFT SHOULDER WITHOUT CONTRAST,CERVICAL SPINE WITHOUT;  Surgeon: Radiologist, Medication, MD;  Location: [facility];  Service: Radiology;  Laterality: Left;   TEE WITHOUT CARDIOVERSION N/A 09/18/2017   Procedure: TRANSESOPHAGEAL ECHOCARDIOGRAM (TEE);  Surgeon: Pixie Casino, MD;  Location: [facility];  Service: Cardiovascular;  Laterality: N/A;   TOE AMPUTATION Left 03/05/2015   5th toe   TUBAL LIGATION Bilateral 1990   UPPER GASTROINTESTINAL ENDOSCOPY     Social History   Socioeconomic History   Marital status: Single    Spouse name: Not on file   Number of children: 3   Years  of education: Not on file   Highest education level: Not on file  Occupational History   Occupation: cosmetolgist  Tobacco Use   Smoking status: Former    Packs/day: 1.00    Years: 36.00    Total pack years: 36.00    Types: Cigarettes    Quit date: 09/23/2017    Years since quitting: 4.9   Smokeless tobacco: Never   Tobacco comments:    Tobacco info given 03/01/17  Vaping Use   Vaping Use: Never used  Substance and Sexual Activity   Alcohol use: Never   Drug use: Never   Sexual activity: Not Currently    Birth control/protection: Post-menopausal, Surgical  Other Topics Concern   Not on file  Social History Narrative   Lives with two adult children.  Another  daughter lives in [place].  Cosmetologist.  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  Likes to fish and play bingo.  Goes to church.   Social Determinants of Health   Financial Resource Strain: Not on file  Food Insecurity: No Food Insecurity (05/02/2022)   Hunger Vital Sign    Worried About Running Out of Food in the Last Year: Never true    Ran Out of Food in the Last Year: Never true  Transportation Needs: No Transportation Needs (05/03/2022)   PRAPARE - Hydrologist (Medical): No    Lack of Transportation (Non-Medical): No  Physical Activity: Not on file  Stress: Not on file  Social Connections: Not on file   Family History  Problem Relation Age of Onset   Heart disease Mother        CAB age 45   Diabetes Mother    Cancer Father 87       pancreatic cancer   Stroke Father    Pancreatic cancer Sister    Diabetes Sister    Kidney disease Sister        renal failure   Diabetes Brother    Kidney disease Brother        renal failure   Breast cancer Maternal Aunt        per pt aunts and uncles died of cancer not sure the type   Breast cancer Paternal Aunt    Cancer Paternal Uncle    Cancer Sister    Esophageal cancer Neg Hx    Rectal cancer Neg Hx    Stomach cancer Neg Hx    Thyroid disease Neg Hx    Colon cancer Neg Hx    Colon polyps Neg Hx    Allergies  Allergen Reactions   Amlodipine Swelling and Other (See Comments)    Leg swelling   Bactrim [Sulfamethoxazole-Trimethoprim] Anaphylaxis   Clonidine Derivatives Swelling and Other (See Comments)    Facial swelling   Lisinopril Swelling and Other (See Comments)    Lip swelling   Statins Swelling    Body and muscle aches and face/lip swelling on Statins (Lipitor, Pravachol)   Aspirin Nausea Only    Doses higher than '81mg'$  only   Latex Rash    NO POWDERED GLOVES!!   Prior to Admission medications   Medication Sig Start Date End Date Taking? Authorizing Provider  acetaminophen-codeine (TYLENOL #3)  300-30 MG tablet Take 1 tablet by mouth 2 (two) times daily as needed for moderate pain. 07/06/22  Yes Aundra Dubin, PA-C  aspirin 81 MG chewable tablet Chew 1 tablet (81 mg total) by mouth daily. 09/19/17  Yes Sherene Sires, DO  Carboxymeth-Glyc-Polysorb PF (REFRESH  OPTIVE MEGA-3) 0.5-1-0.5 % SOLN Place 1 drop into both eyes 2 (two) times daily.   Yes [provider]  carvedilol (COREG) 25 MG tablet TAKE 1 TABLET BY MOUTH TWICE DAILY WITH MEALS 02/23/22  Yes Eniola, Kehinde T, MD  CEQUA 0.09 % SOLN Apply 1 drop to eye 2 (two) times daily. 04/15/22  Yes [provider]  chlorthalidone (HYGROTON) 25 MG tablet Take 1 tablet by mouth once daily 06/06/22  Yes Skeet Latch, MD  hydrALAZINE (APRESOLINE) 100 MG tablet TAKE 1 TABLET BY MOUTH THREE TIMES DAILY 02/23/22  Yes Kinnie Feil, MD  levothyroxine (SYNTHROID) 175 MCG tablet TAKE 1 TABLET BY MOUTH ONCE DAILY IN THE MORNING 30  MINUTES  BEFORE  FOOD 03/02/22  Yes Kinnie Feil, MD  Semaglutide,0.25 or 0.'5MG'$ /DOS, 2 MG/1.5ML SOPN Inject 0.25 mg into the skin once a week. 0.25 mg once weekly for 4 weeks then increase to 0.5 mg weekly for at least 4 weeks,max 1 mg Patient taking differently: Inject 0.5 mg into the skin once a week. 0.25 mg once weekly for 4 weeks then increase to 0.5 mg weekly for at least 4 weeks,max 1 mg 06/17/22  Yes Martyn Malay, MD  spironolactone (ALDACTONE) 50 MG tablet Take 1 tablet by mouth once daily 07/26/22  Yes Eniola, Phill Myron, MD  tiZANidine (ZANAFLEX) 2 MG tablet Take 1 tablet (2 mg total) by mouth every 12 (twelve) hours as needed for muscle spasms. 07/08/22  Yes Kinnie Feil, MD     Positive ROS: All other systems have been reviewed and were otherwise negative with the exception of those mentioned in the HPI and as above.  Physical Exam: General: Alert, no acute distress Cardiovascular: No pedal edema Respiratory: No cyanosis, no use of accessory musculature GI: abdomen soft Skin:  No lesions in the area of chief complaint Neurologic: Sensation intact distally Psychiatric: Patient is competent for consent with normal mood and affect Lymphatic: no lymphedema  MUSCULOSKELETAL: exam stable  Assessment: NECK PAIN, CHRONIC LEFT SHOULDER PAIN  Plan: Plan for Procedure(s): MRI WITH LEFT SHOULDER WITHOUT CONTRAST,CERVICAL SPINE WITHOUT  The risks benefits and alternatives were discussed with the patient including but not limited to the risks of nonoperative treatment, versus surgical intervention including infection, bleeding, nerve injury,  blood clots, cardiopulmonary complications, morbidity, mortality, among others, and they were willing to proceed.   Eduard Roux, MD 08/23/2022 8:01 PM

## 2022-08-23 NOTE — Telephone Encounter (Signed)
Returned a call to the patient. Informed her that I spoke with Wells Guiles at [facility] and she said that they told her that due to her insurance change she has to switch over from their service to [employer]. She was told by Huey Romans that once she gets a machine from [employer], she will need to bring theirs back. Patient states that this is what she understood and the relayed to our operator. States our operator must just misunderstood what she was saying. I informed patient I'm glad she called otherwise I would not have known to transfer her care.

## 2022-08-25 ENCOUNTER — Ambulatory Visit: Payer: Medicare HMO | Admitting: Sports Medicine

## 2022-08-25 ENCOUNTER — Ambulatory Visit: Payer: Self-pay

## 2022-08-25 ENCOUNTER — Encounter: Payer: Self-pay | Admitting: Sports Medicine

## 2022-08-25 ENCOUNTER — Ambulatory Visit (INDEPENDENT_AMBULATORY_CARE_PROVIDER_SITE_OTHER): Payer: Medicare HMO | Admitting: Sports Medicine

## 2022-08-25 DIAGNOSIS — M25512 Pain in left shoulder: Secondary | ICD-10-CM

## 2022-08-25 DIAGNOSIS — M25511 Pain in right shoulder: Secondary | ICD-10-CM

## 2022-08-25 DIAGNOSIS — G8929 Other chronic pain: Secondary | ICD-10-CM | POA: Diagnosis not present

## 2022-08-25 MED ORDER — BUPIVACAINE HCL 0.25 % IJ SOLN
2.0000 mL | INTRAMUSCULAR | Status: AC | PRN
  Administered 2022-08-25: 2 mL via INTRA_ARTICULAR

## 2022-08-25 MED ORDER — LIDOCAINE HCL 1 % IJ SOLN
2.0000 mL | INTRAMUSCULAR | Status: AC | PRN
  Administered 2022-08-25: 2 mL

## 2022-08-25 MED ORDER — METHYLPREDNISOLONE ACETATE 40 MG/ML IJ SUSP
40.0000 mg | INTRAMUSCULAR | Status: AC | PRN
  Administered 2022-08-25: 40 mg via INTRA_ARTICULAR

## 2022-08-25 NOTE — Progress Notes (Signed)
   Procedure Note  Patient: Teresa [name]             Date of Birth: Sep 30, 1960           MRN: [id number]             Visit Date: 08/25/2022  Procedures: Visit Diagnoses:  1. Chronic right shoulder pain   2. Chronic left shoulder pain    Large Joint Inj: R glenohumeral on 08/25/2022 10:10 AM Indications: pain Details: 22 G 3.5 in needle, ultrasound-guided posterior approach Medications: 2 mL lidocaine 1 %; 2 mL bupivacaine 0.25 %; 40 mg methylPREDNISolone acetate 40 MG/ML Outcome: tolerated well, no immediate complications  US-guided glenohumeral joint injection, right shoulder After discussion on risks/benefits/indications, informed verbal consent was obtained. A timeout was then performed. The patient was positioned lying lateral recumbent on examination table. The patient's shoulder was prepped with betadine and multiple alcohol swabs and utilizing ultrasound guidance, the patient's glenohumeral joint was identified on ultrasound. Using ultrasound guidance a 22-gauge, 3.5 inch needle with a mixture of 2:2:1 cc's lidocaine:bupivicaine:depomedrol was directed from a lateral to medial direction via in-plane technique into the glenohumeral joint with visualization of appropriate spread of injectate into the joint. Patient tolerated the procedure well without immediate complications.      Procedure, treatment alternatives, risks and benefits explained, specific risks discussed. Consent was given by the patient. Immediately prior to procedure a time out was called to verify the correct patient, procedure, equipment, support staff and site/side marked as required. Patient was prepped and draped in the usual sterile fashion.    Large Joint Inj: L glenohumeral on 08/25/2022 10:12 AM Indications: pain Details: 22 G 3.5 in needle, ultrasound-guided posterior approach Medications: 2 mL lidocaine 1 %; 2 mL bupivacaine 0.25 %; 40 mg methylPREDNISolone acetate 40 MG/ML Outcome: tolerated well, no  immediate complications  US-guided glenohumeral joint injection, left shoulder After discussion on risks/benefits/indications, informed verbal consent was obtained. A timeout was then performed. The patient was positioned lying lateral recumbent on examination table. The patient's shoulder was prepped with betadine and multiple alcohol swabs and utilizing ultrasound guidance, the patient's glenohumeral joint was identified on ultrasound. Using ultrasound guidance a 22-gauge, 3.5 inch needle with a mixture of 2:2:1 cc's lidocaine:bupivicaine:depomedrol was directed from a lateral to medial direction via in-plane technique into the glenohumeral joint with visualization of appropriate spread of injectate into the joint. Patient tolerated the procedure well without immediate complications.  Procedure, treatment alternatives, risks and benefits explained, specific risks discussed. Consent was given by the patient. Immediately prior to procedure a time out was called to verify the correct patient, procedure, equipment, support staff and site/side marked as required. Patient was prepped and draped in the usual sterile fashion.     - I evaluated the patient about 10 minutes post-injection and she had significant improvement in pain and range of motion - follow-up with Dr. Erlinda Hong as indicated; I am happy to see them as needed  Elba Barman, DO [place]  This note was dictated using Dragon naturally speaking software and may contain errors in syntax, spelling, or content which have not been identified prior to signing this note.

## 2022-08-26 ENCOUNTER — Encounter: Payer: Self-pay | Admitting: Family Medicine

## 2022-08-29 ENCOUNTER — Ambulatory Visit: Payer: Medicare HMO

## 2022-08-29 ENCOUNTER — Telehealth: Payer: Self-pay | Admitting: Family Medicine

## 2022-08-29 NOTE — Telephone Encounter (Signed)
I request a form completion request from [facility] [employer] for the patient to be off ASA for Ptosis repair. I called and discussed the plan with her. As discussed, ideally, if this is a very mild surgery with low bleeding risk, she can continue her ASA. She is aware of the thromboembolic risk of being off ASA, given her CVA in 2019. If the procedure is expected to cause heavy bleeding, then she is advised to get off ASA 7 days before surgery and may resume ASA a day or two once hemostasis is assured. She will reach out to her ophthalmologist for further instructions regarding bleeding risks--form completed based on this information. She verbalized understanding and agreed with the plan.

## 2022-09-05 ENCOUNTER — Encounter: Payer: Self-pay | Admitting: Cardiovascular Disease

## 2022-09-05 NOTE — Telephone Encounter (Signed)
Calling with to say that she dont have [employer], so patient unsure of why she is getting notification in regards to it. Please advise

## 2022-09-07 DIAGNOSIS — H02402 Unspecified ptosis of left eyelid: Secondary | ICD-10-CM | POA: Diagnosis not present

## 2022-09-19 ENCOUNTER — Encounter: Payer: Self-pay | Admitting: Cardiovascular Disease

## 2022-09-27 ENCOUNTER — Ambulatory Visit: Payer: Medicare HMO | Attending: Cardiology | Admitting: Student

## 2022-09-27 ENCOUNTER — Encounter: Payer: Self-pay | Admitting: Student

## 2022-09-27 DIAGNOSIS — E7849 Other hyperlipidemia: Secondary | ICD-10-CM | POA: Diagnosis not present

## 2022-09-27 NOTE — Progress Notes (Signed)
Patient ID: Teresa [name]                 DOB: [birth year]                    MRN: [id number]      HPI: Teresa [name] is a 62 y.o. female patient referred to lipid clinic by Teresa [name]. PMH is significant for HTN,CAD, HDL,OSA, Cryptogenic stroke, Carotid artery disease.  She was seen on 08/23/2021 by Dr. Oval [name], at that time discussions were held surrounding PCSK9 inhibitor and the patient wanted to think about it.Was started on Zetia by her PCP however noticed that she was having worsening leg cramps and overall fatigue, onset of symptoms were congruent with when she started Zetia. Patient saw Teresa [name] on 01/26, patient did not wanted to start injectable therapy and reluctant to start Nexletol due hx of gout. Patient presented for lipid clinic today. Reports she has started taking herbal supplement called "Choless Control" form last 3 weeks. Her mother and her side family has lots of heart problem. She eats healthy and recently started Ozempic to control her BG ( A1c is 6.5) she tolerates them well.  Reviewed options for lowering LDL cholesterol, including ezetimibe, PCSK-9 inhibitors, bempedoic acid and inclisiran.  Discussed mechanisms of action, dosing, side effects and potential decreases in LDL cholesterol. Also reviewed cost information and potential options for patient assistance.   Patient is in agreement to start Leqvio   Current Medications: none  Previously tried: Repatha- never took -did not wanted to go on self injection therapies, Lipitor 40,20 mg, pravastatin 40 mg - swelling lips and myalgia - 2019, Ezetimibe 10 mg muscle aches -2023 Intolerances: Lipitor 40,20 mg, pravastatin 40 mg - swelling lips and myalgia - 2019, Ezetimibe 10 mg muscle aches -2023 Risk Factors: Elevated Lp(a),HTN,CAD, HDL,OSA, Cryptogenic stroke, Carotid artery disease, T2DM  LDL goal: <55 mg/dl  Diet: eats low fat diet     Labs: Lipid Panel     Component Value Date/Time   CHOL 171  08/02/2022 0838   TRIG 97 08/02/2022 0838   HDL 41 08/02/2022 0838   CHOLHDL 4.2 08/02/2022 0838   CHOLHDL 4.3 09/15/2017 1217   VLDL 13 09/15/2017 1217   LDLCALC 112 (H) 08/02/2022 0838   LABVLDL 18 08/02/2022 0838    Past Medical History:  Diagnosis Date   Allergy    Anemia    Cervical disc disorder with radiculopathy of cervical region 02/10/2016   Chronic lower back pain    Claudication in peripheral vascular disease 06/12/2020   Diverticulosis    Ear discomfort 03/11/2016   EKG, abnormal 01/06/2017   Gastritis    GERD (gastroesophageal reflux disease)    history   Graves' disease    2005   H/O hiatal hernia    Hiatal hernia    History of amputation of lesser toe of left foot 03/11/2015   History of colon cancer    Hypertension    Hyperthyroidism    hx 2005   Hypothyroidism    Lateral knee pain, left 08/06/2013   Left shoulder pain 05/28/2015   Migraine headache    "a few times/yr" (03/05/2015)   Neck pain 02/11/2014   Neck strain 02/06/2012   Numbness and tingling in right hand 05/16/2018   Osteomyelitis    /notes 03/05/2015   Osteomyelitis of toe of left foot 03/05/2015   Palpitations 01/05/2016   Positive TB test    1980's had a reaction to TB test  Sleep apnea    Snoring 02/15/2021   Stroke 08/2017   Throat pain 09/21/2011   Normal CT and US neck march 2013, repeat Neck US in 1 year to follow-up small nodules.  Head CT 2013: normal ? glosspharyngeal neuralgia.  On amitriptyline await [facility] ENt referral.    Tubular adenoma of colon    Tubular adenoma of colon    Ulnar neuropathy at elbow of right upper extremity 06/12/2018    Current Outpatient Medications on File Prior to Visit  Medication Sig Dispense Refill   acetaminophen-codeine (TYLENOL #3) 300-30 MG tablet Take 1 tablet by mouth 2 (two) times daily as needed for moderate pain. 20 tablet 2   aspirin 81 MG chewable tablet Chew 1 tablet (81 mg total) by mouth daily. 30 tablet 0    Carboxymeth-Glyc-Polysorb PF (REFRESH OPTIVE MEGA-3) 0.5-1-0.5 % SOLN Place 1 drop into both eyes 2 (two) times daily.     carvedilol (COREG) 25 MG tablet TAKE 1 TABLET BY MOUTH TWICE DAILY WITH MEALS 180 tablet 1   CEQUA 0.09 % SOLN Apply 1 drop to eye 2 (two) times daily.     chlorthalidone (HYGROTON) 25 MG tablet Take 1 tablet by mouth once daily 90 tablet 0   hydrALAZINE (APRESOLINE) 100 MG tablet TAKE 1 TABLET BY MOUTH THREE TIMES DAILY 270 tablet 1   levothyroxine (SYNTHROID) 175 MCG tablet TAKE 1 TABLET BY MOUTH ONCE DAILY IN THE MORNING 30  MINUTES  BEFORE  FOOD 90 tablet 1   Semaglutide,0.25 or 0.5MG /DOS, 2 MG/1.5ML SOPN Inject 0.25 mg into the skin once a week. 0.25 mg once weekly for 4 weeks then increase to 0.5 mg weekly for at least 4 weeks,max 1 mg (Patient taking differently: Inject 0.5 mg into the skin once a week. 0.25 mg once weekly for 4 weeks then increase to 0.5 mg weekly for at least 4 weeks,max 1 mg) 3 mL 3   spironolactone (ALDACTONE) 50 MG tablet Take 1 tablet by mouth once daily 90 tablet 1   tiZANidine (ZANAFLEX) 2 MG tablet Take 1 tablet (2 mg total) by mouth every 12 (twelve) hours as needed for muscle spasms. 30 tablet 1   No current facility-administered medications on file prior to visit.    Allergies  Allergen Reactions   Amlodipine Swelling and Other (See Comments)    Leg swelling   Bactrim [Sulfamethoxazole-Trimethoprim] Anaphylaxis   Clonidine Derivatives Swelling and Other (See Comments)    Facial swelling   Lisinopril Swelling and Other (See Comments)    Lip swelling   Statins Swelling    Body and muscle aches and face/lip swelling on Statins (Lipitor, Pravachol)   Aspirin Nausea Only    Doses higher than 81mg  only   Latex Rash    NO POWDERED GLOVES!!    Assessment/Plan:  1. Hyperlipidemia -  Problem  Hyperlipidemia   Current Medications: none  Previously tried: Repatha- never took -did not wanted to go on self injection therapies, Lipitor  40,20 mg, pravastatin 40 mg - swelling lips and myalgia - 2019, Ezetimibe 10 mg muscle aches -2023 Intolerances: Lipitor 40,20 mg, pravastatin 40 mg - swelling lips and myalgia - 2019, Ezetimibe 10 mg muscle aches -2023 Risk Factors: Elevated Lp(a),HTN,CAD, HDL,OSA, Cryptogenic stroke, Carotid artery disease, T2DM  LDL goal: <55 mg/dl    Hyperlipidemia Assessment:  LDL goal: < 55  mg/dl last LDLc 112 mg/dl ( 08/02/2022) Intolerant/allergic  to Zetia high intensity and moderate intensity statins  Risk Factors: Elevated Lp(a),HTN,CAD, HDL,OSA, Cryptogenic  stroke, Carotid artery disease, T2DM  Discussed next potential options (PCSK-9 inhibitors, bempedoic acid and inclisiran); cost, dosing efficacy, side effects  Patient does not prefer self injectable therapies; has medicare and medicaid - Leqvio would be fully covered   Plan: Will initiate Leqvio start form for Leqvio  Will inform patient once we get more information on benefits and coverage Patient prefers to go to [facility]  [facility] and Lp(a) due in 2-3 months after starting PCSK9i    Thank you,  Cammy Copa, Pharm.D [facility] [address], [place],  [address]  Phone: [phone]; Fax: [phone]

## 2022-09-27 NOTE — Patient Instructions (Signed)
Your Results:             Your most recent labs Goal  Total Cholesterol 171 < 200  Triglycerides 97 < 150  HDL (happy/good cholesterol) 41 > 40  LDL (lousy/bad cholesterol 112 < 70   Medication discussed- Leqvio or Praluent  We will start the process to get discussed medication covered by your insurance.  Once the prior authorization is complete, we will call you to let you know and confirm pharmacy information.     Praluent is a cholesterol medication that improved your body's ability to get rid of "bad cholesterol" known as LDL. It can lower your LDL up to 60%. It is an injection that is given under the skin every 2 weeks. The most common side effects of Praluent include runny nose, symptoms of the common cold, rarely flu or flu-like symptoms, back/muscle pain in about 3-4% of the patients, and redness, pain, or bruising at the injection site.     Lab orders: We want to repeat labs after 2-3 months.  We will send you a lab order to remind you once we get closer to that time.        Cammy Copa, Pharm.D  [facility] [address], [place], [place] [address]  Phone: [phone]; Fax: [phone]

## 2022-09-27 NOTE — Assessment & Plan Note (Addendum)
Assessment:  LDL goal: < 55  mg/dl last LDLc 112 mg/dl ( 08/02/2022) Intolerant/allergic  to Zetia high intensity and moderate intensity statins  Risk Factors: Elevated Lp(a),HTN,CAD, HDL,OSA, Cryptogenic stroke, Carotid artery disease, T2DM  Discussed next potential options (PCSK-9 inhibitors, bempedoic acid and inclisiran); cost, dosing efficacy, side effects  Patient does not prefer self injectable therapies; has medicare and medicaid - Leqvio would be fully covered   Plan: Will initiate Leqvio start form for Leqvio  Will inform patient once we get more information on benefits and coverage Patient prefers to go to [facility]  [facility] lab and Lp(a) due in 2-3 months after starting [facility]

## 2022-09-28 ENCOUNTER — Other Ambulatory Visit: Payer: Self-pay | Admitting: Cardiovascular Disease

## 2022-09-28 ENCOUNTER — Other Ambulatory Visit: Payer: Self-pay | Admitting: Family Medicine

## 2022-09-28 NOTE — Telephone Encounter (Signed)
Rx request sent to pharmacy.  

## 2022-09-29 DIAGNOSIS — Z09 Encounter for follow-up examination after completed treatment for conditions other than malignant neoplasm: Secondary | ICD-10-CM | POA: Diagnosis not present

## 2022-10-03 ENCOUNTER — Encounter: Payer: Self-pay | Admitting: Family Medicine

## 2022-10-04 ENCOUNTER — Encounter: Payer: Self-pay | Admitting: Family Medicine

## 2022-10-04 ENCOUNTER — Other Ambulatory Visit: Payer: Self-pay | Admitting: Pharmacist

## 2022-10-04 DIAGNOSIS — G8929 Other chronic pain: Secondary | ICD-10-CM | POA: Insufficient documentation

## 2022-10-09 ENCOUNTER — Encounter: Payer: Self-pay | Admitting: Family Medicine

## 2022-10-10 ENCOUNTER — Telehealth: Payer: Self-pay | Admitting: Pharmacy Technician

## 2022-10-10 ENCOUNTER — Encounter: Payer: Self-pay | Admitting: Family Medicine

## 2022-10-10 DIAGNOSIS — Z8673 Personal history of transient ischemic attack (TIA), and cerebral infarction without residual deficits: Secondary | ICD-10-CM

## 2022-10-10 NOTE — Telephone Encounter (Signed)
Made appt for 4/26. Patient stated that if they are gone my appt date. She will call can cancel appt. Aquilla Solian, CMA

## 2022-10-10 NOTE — Telephone Encounter (Signed)
Teresa [name], F/U: FYI NOTE  Patient has been approved.   Auth Submission: APPROVED Site of care: Site of care: [facility] Payer: [employer] MEDICARE Medication & CPT/J Code(s) submitted: Leqvio (Inclisiran) [id number] Route of submission (phone, fax, portal):  Phone # Fax # Auth type: Buy/Bill Units/visits requested: 2 Reference number: [id number] Approval from: 10/06/22 to 06/27/23   Patient has been referred to [facility] [employer].

## 2022-10-11 ENCOUNTER — Encounter: Payer: Self-pay | Admitting: Family Medicine

## 2022-10-12 NOTE — Addendum Note (Signed)
Addended by: Tylene Fantasia on: 10/12/2022 08:41 AM   Modules accepted: Orders

## 2022-10-12 NOTE — Addendum Note (Signed)
Addended by: Tylene Fantasia on: 10/12/2022 04:04 PM   Modules accepted: Orders

## 2022-10-12 NOTE — Telephone Encounter (Signed)
Patient has been informed about approval and follow up labs.

## 2022-10-13 DIAGNOSIS — H04542 Stenosis of left lacrimal canaliculi: Secondary | ICD-10-CM | POA: Diagnosis not present

## 2022-10-13 DIAGNOSIS — H04332 Acute lacrimal canaliculitis of left lacrimal passage: Secondary | ICD-10-CM | POA: Diagnosis not present

## 2022-10-17 ENCOUNTER — Ambulatory Visit (INDEPENDENT_AMBULATORY_CARE_PROVIDER_SITE_OTHER): Payer: Medicare HMO

## 2022-10-17 VITALS — BP 139/82 | HR 75 | Temp 97.7°F | Resp 16 | Ht 65.0 in | Wt 192.6 lb

## 2022-10-17 DIAGNOSIS — Z8673 Personal history of transient ischemic attack (TIA), and cerebral infarction without residual deficits: Secondary | ICD-10-CM | POA: Diagnosis not present

## 2022-10-17 DIAGNOSIS — I2510 Atherosclerotic heart disease of native coronary artery without angina pectoris: Secondary | ICD-10-CM | POA: Diagnosis not present

## 2022-10-17 DIAGNOSIS — E7849 Other hyperlipidemia: Secondary | ICD-10-CM

## 2022-10-17 MED ORDER — INCLISIRAN SODIUM 284 MG/1.5ML ~~LOC~~ SOSY
284.0000 mg | PREFILLED_SYRINGE | Freq: Once | SUBCUTANEOUS | Status: AC
  Administered 2022-10-17: 284 mg via SUBCUTANEOUS
  Filled 2022-10-17: qty 1.5

## 2022-10-17 NOTE — Patient Instructions (Signed)
Inclisiran Injection What is this medication? INCLISIRAN (in kli SIR an) treats high cholesterol. It works by decreasing bad cholesterol (such as LDL) in your blood. Changes to diet and exercise are often combined with this medication. This medicine may be used for other purposes; ask your health care provider or pharmacist if you have questions. COMMON BRAND NAME(S): LEQVIO What should I tell my care team before I take this medication? They need to know if you have any of these conditions: An unusual or allergic reaction to inclisiran, other medications, foods, dyes, or preservatives Pregnant or trying to get pregnant Breast-feeding How should I use this medication? This medication is injected under the skin. It is given by your care team in a hospital or clinic setting. Talk to your care team about the use of this medication in children. Special care may be needed. Overdosage: If you think you have taken too much of this medicine contact a poison control center or emergency room at once. NOTE: This medicine is only for you. Do not share this medicine with others. What if I miss a dose? Keep appointments for follow-up doses. It is important not to miss your dose. Call your care team if you are unable to keep an appointment. What may interact with this medication? Interactions are not expected. This list may not describe all possible interactions. Give your health care provider a list of all the medicines, herbs, non-prescription drugs, or dietary supplements you use. Also tell them if you smoke, drink alcohol, or use illegal drugs. Some items may interact with your medicine. What should I watch for while using this medication? Visit your care team for regular checks on your progress. Tell your care team if your symptoms do not start to get better or if they get worse. You may need blood work while you are taking this medication. What side effects may I notice from receiving this  medication? Side effects that you should report to your care team as soon as possible: Allergic reactions--skin rash, itching, hives, swelling of the face, lips, tongue, or throat Side effects that usually do not require medical attention (report these to your care team if they continue or are bothersome): Joint pain Pain, redness, or irritation at injection site This list may not describe all possible side effects. Call your doctor for medical advice about side effects. You may report side effects to FDA at 1-800-FDA-1088. Where should I keep my medication? This medication is given in a hospital or clinic. It will not be stored at home. NOTE: This sheet is a summary. It may not cover all possible information. If you have questions about this medicine, talk to your doctor, pharmacist, or health care provider.  2023 Elsevier/Gold Standard (2020-07-01 00:00:00)  

## 2022-10-17 NOTE — Progress Notes (Signed)
Diagnosis: Hyperlipidemia  Provider:  Praveen Mannam MD  Procedure: Injection  Leqvio (inclisiran), Dose: 284 mg, Site: subcutaneous, Number of injections: 1  Post Care: Observation period completed  Discharge: Condition: Good, Destination: Home . AVS Provided  Performed by:  Elder Davidian, RN       

## 2022-10-21 ENCOUNTER — Ambulatory Visit: Payer: Medicare HMO | Admitting: Family Medicine

## 2022-10-25 DIAGNOSIS — Z09 Encounter for follow-up examination after completed treatment for conditions other than malignant neoplasm: Secondary | ICD-10-CM | POA: Diagnosis not present

## 2022-10-27 ENCOUNTER — Other Ambulatory Visit: Payer: Self-pay | Admitting: Family Medicine

## 2022-10-27 DIAGNOSIS — Z1231 Encounter for screening mammogram for malignant neoplasm of breast: Secondary | ICD-10-CM

## 2022-11-07 ENCOUNTER — Other Ambulatory Visit: Payer: Self-pay | Admitting: Family Medicine

## 2022-11-07 ENCOUNTER — Encounter: Payer: Self-pay | Admitting: Family Medicine

## 2022-11-07 MED ORDER — OZEMPIC (1 MG/DOSE) 4 MG/3ML ~~LOC~~ SOPN: 1.0000 mg | PEN_INJECTOR | SUBCUTANEOUS | 1 refills | Status: DC

## 2022-11-15 ENCOUNTER — Other Ambulatory Visit: Payer: Self-pay

## 2022-11-15 MED ORDER — LEVOTHYROXINE SODIUM 175 MCG PO TABS: ORAL_TABLET | ORAL | 1 refills | Status: DC

## 2022-11-18 DIAGNOSIS — G5601 Carpal tunnel syndrome, right upper limb: Secondary | ICD-10-CM | POA: Diagnosis not present

## 2022-11-28 ENCOUNTER — Other Ambulatory Visit: Payer: Self-pay | Admitting: Family Medicine

## 2022-11-28 ENCOUNTER — Encounter: Payer: Self-pay | Admitting: Family Medicine

## 2022-11-28 MED ORDER — HYDRALAZINE HCL 100 MG PO TABS: 100.0000 mg | ORAL_TABLET | Freq: Three times a day (TID) | ORAL | 1 refills | Status: DC

## 2022-11-29 ENCOUNTER — Ambulatory Visit
Admission: RE | Admit: 2022-11-29 | Discharge: 2022-11-29 | Disposition: A | Discharge status: Home / Self Care | Payer: Medicare HMO | Source: Ambulatory Visit | Attending: Family Medicine | Admitting: Family Medicine

## 2022-11-29 DIAGNOSIS — Z1231 Encounter for screening mammogram for malignant neoplasm of breast: Secondary | ICD-10-CM | POA: Diagnosis not present

## 2022-11-30 ENCOUNTER — Telehealth: Payer: Self-pay | Admitting: Family Medicine

## 2022-11-30 ENCOUNTER — Other Ambulatory Visit: Payer: Self-pay | Admitting: Family Medicine

## 2022-11-30 DIAGNOSIS — R928 Other abnormal and inconclusive findings on diagnostic imaging of breast: Secondary | ICD-10-CM

## 2022-11-30 NOTE — Telephone Encounter (Signed)
I discussed mammogram report with her. All questions answered. She will call [facility] to schedule her diagnostic mammogram. Order placed.

## 2022-12-01 ENCOUNTER — Encounter: Payer: Self-pay | Admitting: Family Medicine

## 2022-12-05 ENCOUNTER — Ambulatory Visit
Admission: RE | Admit: 2022-12-05 | Discharge: 2022-12-05 | Disposition: A | Discharge status: Home / Self Care | Payer: Medicare HMO | Source: Ambulatory Visit | Attending: Family Medicine | Admitting: Family Medicine

## 2022-12-05 ENCOUNTER — Other Ambulatory Visit: Payer: Self-pay | Admitting: Family Medicine

## 2022-12-05 DIAGNOSIS — N6320 Unspecified lump in the left breast, unspecified quadrant: Secondary | ICD-10-CM

## 2022-12-05 DIAGNOSIS — R928 Other abnormal and inconclusive findings on diagnostic imaging of breast: Secondary | ICD-10-CM

## 2022-12-05 DIAGNOSIS — N63 Unspecified lump in unspecified breast: Secondary | ICD-10-CM | POA: Diagnosis not present

## 2022-12-05 DIAGNOSIS — R922 Inconclusive mammogram: Secondary | ICD-10-CM | POA: Diagnosis not present

## 2022-12-05 DIAGNOSIS — N6321 Unspecified lump in the left breast, upper outer quadrant: Secondary | ICD-10-CM | POA: Diagnosis not present

## 2022-12-10 ENCOUNTER — Encounter: Payer: Self-pay | Admitting: Physician Assistant

## 2022-12-12 DIAGNOSIS — G5601 Carpal tunnel syndrome, right upper limb: Secondary | ICD-10-CM | POA: Diagnosis not present

## 2022-12-22 DIAGNOSIS — G5601 Carpal tunnel syndrome, right upper limb: Secondary | ICD-10-CM | POA: Diagnosis not present

## 2022-12-30 ENCOUNTER — Ambulatory Visit (INDEPENDENT_AMBULATORY_CARE_PROVIDER_SITE_OTHER): Payer: Medicare HMO | Admitting: Orthopaedic Surgery

## 2022-12-30 ENCOUNTER — Encounter: Payer: Self-pay | Admitting: Orthopaedic Surgery

## 2022-12-30 DIAGNOSIS — M5412 Radiculopathy, cervical region: Secondary | ICD-10-CM | POA: Diagnosis not present

## 2022-12-30 DIAGNOSIS — G8929 Other chronic pain: Secondary | ICD-10-CM | POA: Diagnosis not present

## 2022-12-30 DIAGNOSIS — M25511 Pain in right shoulder: Secondary | ICD-10-CM | POA: Diagnosis not present

## 2022-12-30 NOTE — Progress Notes (Signed)
Office Visit Note   Patient: Teresa [name]           Date of Birth: 09-02-1960           MRN: [id number] Visit Date: 12/30/2022              Requested by: Doreene Eland, MD [address] [place],  [place] [address] PCP: Doreene Eland, MD   Assessment & Plan: Visit Diagnoses:  1. Chronic right shoulder pain   2. Cervical radiculopathy     Plan: Patient is a 62 year old female who had good relief from left glenohumeral injection but no relief from the right glenohumeral injection.  At this point I feel that her cervical spine may be the culprit to her right upper extremity symptoms therefore I will make referral to Dr. Alvester Morin for cervical spine ESI.  Follow-up as needed.  Follow-Up Instructions: No follow-ups on file.   Orders:  No orders of the defined types were placed in this encounter.  No orders of the defined types were placed in this encounter.     Procedures: No procedures performed   Clinical Data: No additional findings.   Subjective: Chief Complaint  Patient presents with   Right Upper Arm - Pain    HPI Patient returns today for follow-up for continued right arm pain.  Glenohumeral injection was ineffective.  This was done in February. Review of Systems  Constitutional: Negative.   HENT: Negative.    Eyes: Negative.   Respiratory: Negative.    Cardiovascular: Negative.   Endocrine: Negative.   Musculoskeletal: Negative.   Neurological: Negative.   Hematological: Negative.   Psychiatric/Behavioral: Negative.    All other systems reviewed and are negative.    Objective: Vital Signs: There were no vitals taken for this visit.  Physical Exam Vitals and nursing note reviewed.  Constitutional:      Appearance: She is well-developed.  HENT:     Head: Normocephalic and atraumatic.  Pulmonary:     Effort: Pulmonary effort is normal.  Abdominal:     Palpations: Abdomen is soft.  Musculoskeletal:     Cervical back: Neck supple.   Skin:    General: Skin is warm.     Capillary Refill: Capillary refill takes less than 2 seconds.  Neurological:     Mental Status: She is alert and oriented to person, place, and time.  Psychiatric:        Behavior: Behavior normal.        Thought Content: Thought content normal.        Judgment: Judgment normal.     Ortho Exam Examination of the right upper extremity and cervical spine are unchanged. Specialty Comments:  No specialty comments available.  Imaging: No results found.   PMFS History: Patient Active Problem List   Diagnosis Date Noted   Chronic back pain 10/04/2022   Diabetes mellitus, new onset (HCC) 07/11/2022   Eustachian tube dysfunction, left 05/03/2022   Vitreous flashes of both eyes 11/26/2021   Obstructive sleep apnea 07/05/2021   Bertolotti's syndrome 05/26/2021   Cervical radiculopathy 05/11/2021   Fatigue 05/11/2021   CAD in native artery 01/12/2021   Obese 01/11/2021   Peripheral vascular disease (HCC) 06/12/2020   History of forearm fracture 05/01/2020   History of hiatal hernia 05/01/2020   CKD (chronic kidney disease), stage II 01/15/2020   Tubular adenoma of colon 09/14/2019   Chronic diastolic CHF (congestive heart failure) (HCC) 09/14/2019   COVID-19 07/02/2019   Ulnar neuropathy  at elbow of right upper extremity 06/12/2018   Migraine 01/12/2018   Hyperlipidemia    History of CVA (cerebrovascular accident) 09/15/2017   History of cholecystectomy 06/27/2017   Small intestinal bacterial overgrowth 03/23/2017   Paresthesia 01/06/2017   Tendinopathy of left rotator cuff 10/27/2015   Left shoulder pain 05/28/2015   History of amputation of lesser toe, left (HCC) 03/11/2015   Resistant hypertension    Thyroid nodule 02/12/2013   GERD (gastroesophageal reflux disease) 02/06/2012   History of tobacco abuse 09/21/2011   Hypertension 09/21/2011   Hypothyroidism 09/21/2011   Past Medical History:  Diagnosis Date   Allergy    Anemia     Cervical disc disorder with radiculopathy of cervical region 02/10/2016   Chronic lower back pain    Claudication in peripheral vascular disease (HCC) 06/12/2020   Diverticulosis    Ear discomfort 03/11/2016   EKG, abnormal 01/06/2017   Gastritis    GERD (gastroesophageal reflux disease)    history   Graves' disease    2005   H/O hiatal hernia    Hiatal hernia    History of amputation of lesser toe of left foot (HCC) 03/11/2015   History of colon cancer    Hypertension    Hyperthyroidism    hx 2005   Hypothyroidism    Lateral knee pain, left 08/06/2013   Left shoulder pain 05/28/2015   Migraine headache    "a few times/yr" (03/05/2015)   Neck pain 02/11/2014   Neck strain 02/06/2012   Numbness and tingling in right hand 05/16/2018   Osteomyelitis (HCC)    Hattie Perch 03/05/2015   Osteomyelitis of toe of left foot (HCC) 03/05/2015   Palpitations 01/05/2016   Positive TB test    1980's had a reaction to TB test   Sleep apnea    Snoring 02/15/2021   Stroke (HCC) 08/2017   Throat pain 09/21/2011   Normal CT and US neck march 2013, repeat Neck US in 1 year to follow-up small nodules.  Head CT 2013: normal ? glosspharyngeal neuralgia.  On amitriptyline await [facility] ENt referral.    Tubular adenoma of colon    Tubular adenoma of colon    Ulnar neuropathy at elbow of right upper extremity 06/12/2018    Family History  Problem Relation Age of Onset   Heart disease Mother        CAB age 40   Diabetes Mother    Cancer Father 74       pancreatic cancer   Stroke Father    Pancreatic cancer Sister    Diabetes Sister    Kidney disease Sister        renal failure   Diabetes Brother    Kidney disease Brother        renal failure   Breast cancer Maternal Aunt        per pt aunts and uncles died of cancer not sure the type   Breast cancer Paternal Aunt    Cancer Paternal Uncle    Cancer Sister    Esophageal cancer Neg Hx    Rectal cancer Neg Hx    Stomach cancer Neg Hx    Thyroid  disease Neg Hx    Colon cancer Neg Hx    Colon polyps Neg Hx     Past Surgical History:  Procedure Laterality Date   ABDOMINAL HYSTERECTOMY  2006   AMPUTATION TOE Left 03/05/2015   Procedure: AMPUTATION LEFT 5TH  TOE;  Surgeon: Toni Arthurs, MD;  Location:  [facility] OR;  Service: Orthopedics;  Laterality: Left;   APPENDECTOMY  2006   BREAST BIOPSY Left 11/26/2014   CESAREAN SECTION  1979 1986 1990   CHOLECYSTECTOMY     COLONOSCOPY     GALLBLADDER SURGERY     LOOP RECORDER INSERTION N/A 09/18/2017   Procedure: LOOP RECORDER INSERTION;  Surgeon: Marinus Maw, MD;  Location: [facility] INVASIVE [facility];  Service: Cardiovascular;  Laterality: N/A;   OPEN REDUCTION INTERNAL FIXATION (ORIF) DISTAL RADIAL FRACTURE Right 09/14/2019   Procedure: OPEN REDUCTION INTERNAL FIXATION (ORIF)  RADIAL AND ULNA FRACTURE;  Surgeon: Bradly Bienenstock, MD;  Location: [facility] OR;  Service: Orthopedics;  Laterality: Right;   ORIF ULNAR FRACTURE Right 09/14/2019   RADIOLOGY WITH ANESTHESIA Left 08/16/2022   Procedure: MRI WITH LEFT SHOULDER WITHOUT CONTRAST,CERVICAL SPINE WITHOUT;  Surgeon: Radiologist, Medication, MD;  Location: [facility] OR;  Service: Radiology;  Laterality: Left;   TEE WITHOUT CARDIOVERSION N/A 09/18/2017   Procedure: TRANSESOPHAGEAL ECHOCARDIOGRAM (TEE);  Surgeon: Chrystie Nose, MD;  Location[facility] ENDOSCOPY;  Service: Cardiovascular;  Laterality: N/A;   TOE AMPUTATION Left 03/05/2015   5th toe   TUBAL LIGATION Bilateral 1990   UPPER GASTROINTESTINAL ENDOSCOPY     Social History   Occupational History   Occupation: cosmetolgist  Tobacco Use   Smoking status: Former    Packs/day: 1.00    Years: 36.00    Additional pack years: 0.00    Total pack years: 36.00    Types: Cigarettes    Quit date: 09/23/2017    Years since quitting: 5.2   Smokeless tobacco: Never   Tobacco comments:    Tobacco info given 03/01/17  Vaping Use   Vaping Use: Never used  Substance and Sexual Activity   Alcohol use: Never   Drug  use: Never   Sexual activity: Not Currently    Birth control/protection: Post-menopausal, Surgical

## 2023-01-03 ENCOUNTER — Other Ambulatory Visit: Payer: Self-pay | Admitting: Family Medicine

## 2023-01-09 ENCOUNTER — Other Ambulatory Visit (HOSPITAL_COMMUNITY): Payer: Self-pay

## 2023-01-09 ENCOUNTER — Encounter: Payer: Self-pay | Admitting: Family Medicine

## 2023-01-11 ENCOUNTER — Encounter: Payer: Self-pay | Admitting: Family Medicine

## 2023-01-12 ENCOUNTER — Other Ambulatory Visit: Payer: Self-pay | Admitting: Family Medicine

## 2023-01-12 MED ORDER — DICLOFENAC SODIUM 1 % EX GEL: 2.0000 g | Freq: Four times a day (QID) | CUTANEOUS | 1 refills | Status: DC

## 2023-01-13 NOTE — Telephone Encounter (Signed)
Spoke with patient. Made appt for 7/23 at 9:50. Aquilla Solian, CMA

## 2023-01-16 ENCOUNTER — Ambulatory Visit (INDEPENDENT_AMBULATORY_CARE_PROVIDER_SITE_OTHER): Payer: Medicare HMO

## 2023-01-16 VITALS — BP 141/91 | HR 74 | Temp 98.5°F | Resp 18 | Ht 65.0 in | Wt 195.4 lb

## 2023-01-16 DIAGNOSIS — Z8673 Personal history of transient ischemic attack (TIA), and cerebral infarction without residual deficits: Secondary | ICD-10-CM

## 2023-01-16 DIAGNOSIS — I2510 Atherosclerotic heart disease of native coronary artery without angina pectoris: Secondary | ICD-10-CM | POA: Diagnosis not present

## 2023-01-16 DIAGNOSIS — E7849 Other hyperlipidemia: Secondary | ICD-10-CM | POA: Diagnosis not present

## 2023-01-16 MED ORDER — INCLISIRAN SODIUM 284 MG/1.5ML ~~LOC~~ SOSY
284.0000 mg | PREFILLED_SYRINGE | Freq: Once | SUBCUTANEOUS | Status: AC
  Administered 2023-01-16: 284 mg via SUBCUTANEOUS
  Filled 2023-01-16: qty 1.5

## 2023-01-16 NOTE — Progress Notes (Signed)
Diagnosis: Hyperlipidemia  Provider:  Chilton Greathouse MD  Procedure: Injection  Leqvio (inclisiran), Dose: 284 mg, Site: subcutaneous, Number of injections: 1  Post Care:  No observation period. Left arm injection  Discharge: Condition: Good, Destination: Home . AVS Declined  Performed by:  Rico Ala, LPN

## 2023-01-17 ENCOUNTER — Ambulatory Visit (HOSPITAL_COMMUNITY)
Admission: RE | Admit: 2023-01-17 | Discharge: 2023-01-17 | Disposition: A | Discharge status: Home / Self Care | Payer: Medicare HMO | Source: Ambulatory Visit | Attending: Family Medicine | Admitting: Family Medicine

## 2023-01-17 ENCOUNTER — Telehealth: Payer: Self-pay | Admitting: Orthopedic Surgery

## 2023-01-17 ENCOUNTER — Encounter: Payer: Self-pay | Admitting: Family Medicine

## 2023-01-17 ENCOUNTER — Ambulatory Visit (INDEPENDENT_AMBULATORY_CARE_PROVIDER_SITE_OTHER): Payer: Medicare HMO | Admitting: Family Medicine

## 2023-01-17 VITALS — BP 127/83 | HR 72 | Wt 194.8 lb

## 2023-01-17 DIAGNOSIS — G43809 Other migraine, not intractable, without status migrainosus: Secondary | ICD-10-CM | POA: Diagnosis not present

## 2023-01-17 DIAGNOSIS — M47812 Spondylosis without myelopathy or radiculopathy, cervical region: Secondary | ICD-10-CM | POA: Diagnosis not present

## 2023-01-17 DIAGNOSIS — E119 Type 2 diabetes mellitus without complications: Secondary | ICD-10-CM | POA: Diagnosis not present

## 2023-01-17 DIAGNOSIS — M79601 Pain in right arm: Secondary | ICD-10-CM | POA: Insufficient documentation

## 2023-01-17 DIAGNOSIS — E1122 Type 2 diabetes mellitus with diabetic chronic kidney disease: Secondary | ICD-10-CM | POA: Insufficient documentation

## 2023-01-17 DIAGNOSIS — E1169 Type 2 diabetes mellitus with other specified complication: Secondary | ICD-10-CM | POA: Diagnosis not present

## 2023-01-17 DIAGNOSIS — M25519 Pain in unspecified shoulder: Secondary | ICD-10-CM | POA: Diagnosis not present

## 2023-01-17 DIAGNOSIS — G8929 Other chronic pain: Secondary | ICD-10-CM | POA: Diagnosis not present

## 2023-01-17 LAB — POCT GLYCOSYLATED HEMOGLOBIN (HGB A1C): HbA1c, POC (controlled diabetic range): 5.8 % (ref 0.0–7.0)

## 2023-01-17 MED ORDER — BACLOFEN 10 MG PO TABS: 10.0000 mg | ORAL_TABLET | Freq: Every evening | ORAL | 0 refills | Status: DC | PRN

## 2023-01-17 NOTE — Assessment & Plan Note (Signed)
At the end of the visit she started having Migraine aura with scotoma which is typical of her No actual headache at the time She rested in the waiting area for this to resolve prior to going home I advised her to call her ophthalmologist today for visit exam She agreed with the plan Tylenol/Ibuprofen as needed for pain

## 2023-01-17 NOTE — Assessment & Plan Note (Signed)
Her A1C looks good. Continue current dose of Ozempic

## 2023-01-17 NOTE — Progress Notes (Signed)
    SUBJECTIVE:   CHIEF COMPLAINT / HPI:   Right arm: Here for right arm pain. This is a chronic issue but worsened over the last few weeks. Pain is mostly mid-upper arm, sometimes radiating to the shoulder or neck. Pain is usually worse at night about 10/10 in severity, keeping her awake all night. She denies any recent injury to her arm. She uses Tylenol #3 as needed for pain with minimal improvement. Has been unable to pick up her Voltaren gel. Tizanidine could be more helpful, too.  DM2: Compliant with Ozempic. Here for follow up.    PERTINENT  PMH / PSH: PMHx reviewed  OBJECTIVE:   BP 127/83   Pulse 72   Wt 194 lb 12.8 oz (88.4 kg)   BMI 32.42 kg/m   Physical Exam Vitals and nursing note reviewed.  Cardiovascular:     Rate and Rhythm: Normal rate and regular rhythm.     Heart sounds: Normal heart sounds. No murmur heard. Pulmonary:     Effort: Pulmonary effort is normal. No respiratory distress.     Breath sounds: Normal breath sounds. No wheezing.  Musculoskeletal:     Comments: Limited ROM of her right UL due to pain. No shoulder or arm deformity. Mild mid-humeral tenderness. Normal neck ROM.      ASSESSMENT/PLAN:   Right arm pain Sometimes associated with neck and shoulder pain May be cervical radiculopathy Obtain DG Cervical spine, right shoulder and right humerus Discussed referral to PT pending xray report F/U with orthopedic as planned   Type 2 diabetes mellitus with other specified complication (HCC) Her A1C looks good. Continue current dose of Ozempic  Migraine At the end of the visit she started having Migraine aura with scotoma which is typical of her No actual headache at the time She rested in the waiting area for this to resolve prior to going home I advised her to call her ophthalmologist today for visit exam She agreed with the plan Tylenol/Ibuprofen as needed for pain     [name], MD [facility]

## 2023-01-17 NOTE — Assessment & Plan Note (Signed)
Sometimes associated with neck and shoulder pain May be cervical radiculopathy Obtain DG Cervical spine, right shoulder and right humerus Discussed referral to PT pending xray report F/U with orthopedic as planned

## 2023-01-17 NOTE — Telephone Encounter (Signed)
Patient called in returning a call

## 2023-01-17 NOTE — Patient Instructions (Signed)
Shoulder Range of Motion Exercises Shoulder range of motion (ROM) exercises are done to keep the shoulder moving freely or to increase movement. They are recommended for people who have shoulder pain or stiffness or who are recovering from a shoulder surgery. Ask your health care provider which exercises are safe for you. Do exercises exactly as told by your health care provider and adjust them as directed. It is normal to feel mild stretching, pulling, tightness, or discomfort as you do these exercises. Stop right away if you feel sudden pain or your pain gets worse. Do not begin these exercises until told by your health care provider. Phase 1 exercise When you are able, do this exercise 1-2 times a day for 30-60 seconds in each direction, or as directed by your health care provider. Pendulum exercise     To do this exercise while sitting: Sit in a chair or at the edge of your bed with your feet flat on the floor. Let your affected arm hang down in front of you over the edge of the bed or chair. Relax your shoulder, arm, and hand. Rock your body so your arm gently swings in small circles. You can also use your unaffected arm to start the motion. Repeat, changing the direction of the circles, swinging your arm left and right, and swinging your arm forward and back. To do this exercise while standing: Stand next to a sturdy chair or table, and hold on to it with your hand on your unaffected side. Bend forward at the waist. Bend your knees slightly. Relax your shoulder, arm, and hand. While keeping your shoulder relaxed, use body motion to swing your arm in small circles. Repeat, changing the direction of the circles, swinging your arm left and right, and swinging your arm forward and back. Between exercises, stand up tall and take a short break to relax your lower back.  Phase 2 exercises Do these exercises 1-2 times a day or as told by your health care provider. Hold each stretch for 30  seconds, and repeat 3 times. Do the exercises with one or both arms as instructed by your health care provider. For these exercises, sit at a table with your hand and arm supported by the table. A chair that slides easily or has wheels can be helpful. External rotation

## 2023-01-19 NOTE — Telephone Encounter (Signed)
Spoke with patient and scheduled OV for 01/20/23.

## 2023-01-20 ENCOUNTER — Encounter: Payer: Self-pay | Admitting: Physical Medicine and Rehabilitation

## 2023-01-20 ENCOUNTER — Ambulatory Visit: Payer: Medicare HMO | Admitting: Physical Medicine and Rehabilitation

## 2023-01-20 ENCOUNTER — Telehealth: Payer: Self-pay | Admitting: Family Medicine

## 2023-01-20 DIAGNOSIS — M542 Cervicalgia: Secondary | ICD-10-CM | POA: Diagnosis not present

## 2023-01-20 DIAGNOSIS — M79601 Pain in right arm: Secondary | ICD-10-CM

## 2023-01-20 DIAGNOSIS — M5412 Radiculopathy, cervical region: Secondary | ICD-10-CM | POA: Diagnosis not present

## 2023-01-20 DIAGNOSIS — M7918 Myalgia, other site: Secondary | ICD-10-CM

## 2023-01-20 MED ORDER — DIAZEPAM 5 MG PO TABS: ORAL_TABLET | ORAL | 0 refills | Status: DC

## 2023-01-20 NOTE — Progress Notes (Unsigned)
Teresa [name] - 62 y.o. female MRN [id number]  Date of birth: 1960/09/07  Office Visit Note: Visit Date: 01/20/2023 PCP: Doreene Eland, MD Referred by: Doreene Eland, MD  Subjective: Chief Complaint  Patient presents with   Neck - Pain   HPI: Teresa [name] is a 62 y.o. female who comes in today per the request of Dr. Glee Arvin for evaluation of chronic, worsening and severe bilateral neck pain radiating to right shoulder and upper arm, neck pain ongoing for several years, right arm pain started in January.  No aggravating factors, pain does become severe when sleeping. She describes pain as sore and aching sensation, currently rates as 3 out of 10. No history of formal physical therapy. Recent cervical MRI imaging exhibits mall right paracentral disc protrusion at C4-C5, central to right paracentral disc protrusion with uncovertebral spurring at C5-C6. No high grade spinal canal stenosis. Dr. Roda Shutters recently placed order for cervical epidural steroid injection, right C7-T1 interlaminar epidural steroid injection has been approved. Patient reports she was feeling very anxious regarding injection and wanted to discuss procedure. Patient denies focal weakness, numbness and tingling. No recent trauma or falls.     Review of Systems  Musculoskeletal:  Positive for myalgias and neck pain.  Neurological:  Negative for tingling, sensory change, focal weakness and weakness.  All other systems reviewed and are negative.  Otherwise per HPI.  Assessment & Plan: Visit Diagnoses:    ICD-10-CM   1. Cervical radiculopathy  M54.12     2. Pain of right upper extremity  M79.601     3. Cervicalgia  M54.2     4. Myofascial pain syndrome  M79.18        Plan: Findings:  Chronic, worsening and severe bilateral neck pain radiating to right shoulder and upper arm.  Patient continues to have severe pain despite good conservative therapy such as home exercise regimen, rest and use of medications.   Patient's clinical presentation and exam are consistent with C6 nerve pattern.  Also feel there could be a myofascial component contributing to her pain as she does have multiple palpable trigger points noted to right levator scapula and trapezius region on exam today.  Next step is to perform diagnostic and hopefully therapeutic right C7-T1 interlaminar epidural steroid injection under fluoroscopic guidance.  She is not currently taking anticoagulant medication.  I discussed cervical injection with her in detail today, she has no further questions at this time.  If good relief of injection we can repeat this procedure infrequently as needed.  If her pain persist post injection I would recommend short course of formal physical therapy with possible dry needling.  No red flag symptoms noted upon exam today.    Meds & Orders:  Meds ordered this encounter  Medications   diazepam (VALIUM) 5 MG tablet    Sig: Take one tablet by mouth with food one hour prior to procedure. May repeat 30 minutes prior if needed.    Dispense:  2 tablet    Refill:  0   No orders of the defined types were placed in this encounter.   Follow-up: Return for Right C7-T1 interlaminar epidural steroid injection.   Procedures: No procedures performed      Clinical History: MRI CERVICAL SPINE WITHOUT CONTRAST   TECHNIQUE: Multiplanar, multisequence MR imaging of the cervical spine was performed. No intravenous contrast was administered.   COMPARISON:  Prior radiograph from 05/10/2022 as well as prior MRI from 02/18/2016.   FINDINGS: Alignment:  Straightening of the normal cervical lordosis. No listhesis.   Vertebrae: Vertebral body height maintained without acute or chronic fracture. Bone marrow signal intensity within normal limits. Subcentimeter benign hemangioma noted within the T3 vertebral body. No worrisome osseous lesions. No abnormal marrow edema.   Cord: Normal signal and morphology.   Posterior Fossa,  vertebral arteries, paraspinal tissues: Small remote lacunar infarct noted within the pons. Visualized brain and posterior fossa otherwise unremarkable. Craniocervical junction normal. Paraspinous soft tissues within normal limits. Normal flow voids seen within the vertebral arteries bilaterally.   Disc levels:   C2-C3: Unremarkable.   C3-C4: Central disc protrusion indents the ventral thecal sac, contacting and flattening the ventral cord (series 6, image 22). Slight superior and inferior migration of disc material noted. No cord signal changes. Resultant mild-to-moderate spinal stenosis. Superimposed uncovertebral spurring with resultant mild bilateral C4 foraminal stenosis.   C4-C5: Small right paracentral disc protrusion indents the ventral thecal sac (series 7, image 13). Associated annular fissure. Mild spinal stenosis without frank cord impingement. Foramina remain patent.   C5-C6: Central to right paracentral disc protrusion indents the ventral thecal sac (series 6, image 48). Slight inferior migration. Minimal cord flattening without cord signal changes. Mild spinal stenosis. Superimposed bilateral uncovertebral spurring with resultant moderate bilateral C6 foraminal stenosis.   C6-C7: Mild disc bulge with uncovertebral spurring. No spinal stenosis. Foramina remain patent.   C7-T1: Normal interspace. Mild bilateral facet hypertrophy. No significant canal or foraminal stenosis.   IMPRESSION: 1. Central disc protrusion at C3-4 with resultant mild-to-moderate spinal stenosis and mild cord flattening, but no cord signal changes. 2. Small right paracentral disc protrusion at C4-5 with resultant mild spinal stenosis. 3. Central to right paracentral disc protrusion with uncovertebral spurring at C5-6 with resultant mild spinal stenosis, with moderate bilateral C6 foraminal narrowing.     Electronically Signed   By: Rise Mu M.D.   On: 08/16/2022 12:44    She reports that she quit smoking about 5 years ago. Her smoking use included cigarettes. She started smoking about 41 years ago. She has a 36 pack-year smoking history. She has never used smokeless tobacco.  Recent Labs    07/08/22 1004 01/17/23 1000  HGBA1C 6.5* 5.8    Objective:  VS:  HT:    WT:   BMI:     BP:   HR: bpm  TEMP: ( )  RESP:  Physical Exam Vitals and nursing note reviewed.  HENT:     Head: Normocephalic and atraumatic.     Right Ear: External ear normal.     Left Ear: External ear normal.     Nose: Nose normal.     Mouth/Throat:     Mouth: Mucous membranes are moist.  Eyes:     Extraocular Movements: Extraocular movements intact.  Cardiovascular:     Rate and Rhythm: Normal rate.     Pulses: Normal pulses.  Pulmonary:     Effort: Pulmonary effort is normal.  Abdominal:     General: Abdomen is flat. There is no distension.  Musculoskeletal:        General: Tenderness present.     Cervical back: Tenderness present.     Comments: No discomfort noted with flexion, extension and side-to-side rotation. Patient has good strength in the upper extremities including 5 out of 5 strength in wrist extension, long finger flexion and APB. Shoulder range of motion is full bilaterally without any sign of impingement. There is no atrophy of the hands intrinsically. Sensation intact bilaterally.  Multiple palpable trigger point noted to right levator scapulae and trapezius region. Dysesthesias noted to right C6 dermatome. Negative Hoffman's sign. Negative Spurling's sign.     Skin:    General: Skin is warm and dry.     Capillary Refill: Capillary refill takes less than 2 seconds.  Neurological:     General: No focal deficit present.     Mental Status: She is alert and oriented to person, place, and time.  Psychiatric:        Mood and Affect: Mood normal.        Behavior: Behavior normal.     Ortho Exam  Imaging: No results found.  Past  Medical/Family/Surgical/Social History: Medications & Allergies reviewed per EMR, new medications updated. Patient Active Problem List   Diagnosis Date Noted   Right arm pain 01/17/2023   Type 2 diabetes mellitus with other specified complication (HCC) 01/17/2023   Chronic back pain 10/04/2022   Eustachian tube dysfunction, left 05/03/2022   Vitreous flashes of both eyes 11/26/2021   Obstructive sleep apnea 07/05/2021   Bertolotti's syndrome 05/26/2021   Cervical radiculopathy 05/11/2021   Fatigue 05/11/2021   CAD in native artery 01/12/2021   Obese 01/11/2021   Peripheral vascular disease (HCC) 06/12/2020   History of forearm fracture 05/01/2020   History of hiatal hernia 05/01/2020   CKD (chronic kidney disease), stage II 01/15/2020   Tubular adenoma of colon 09/14/2019   Chronic diastolic CHF (congestive heart failure) (HCC) 09/14/2019   COVID-19 07/02/2019   Ulnar neuropathy at elbow of right upper extremity 06/12/2018   Migraine 01/12/2018   Hyperlipidemia    History of CVA (cerebrovascular accident) 09/15/2017   History of cholecystectomy 06/27/2017   Small intestinal bacterial overgrowth 03/23/2017   Paresthesia 01/06/2017   Tendinopathy of left rotator cuff 10/27/2015   Left shoulder pain 05/28/2015   History of amputation of lesser toe, left (HCC) 03/11/2015   Resistant hypertension    Thyroid nodule 02/12/2013   GERD (gastroesophageal reflux disease) 02/06/2012   History of tobacco abuse 09/21/2011   Hypertension 09/21/2011   Hypothyroidism 09/21/2011   Past Medical History:  Diagnosis Date   Allergy    Anemia    Cervical disc disorder with radiculopathy of cervical region 02/10/2016   Chronic lower back pain    Claudication in peripheral vascular disease (HCC) 06/12/2020   Diverticulosis    Ear discomfort 03/11/2016   EKG, abnormal 01/06/2017   Gastritis    GERD (gastroesophageal reflux disease)    history   Graves' disease    2005   H/O hiatal hernia     Hiatal hernia    History of amputation of lesser toe of left foot (HCC) 03/11/2015   History of colon cancer    Hypertension    Hyperthyroidism    hx 2005   Hypothyroidism    Lateral knee pain, left 08/06/2013   Left shoulder pain 05/28/2015   Migraine headache    "a few times/yr" (03/05/2015)   Neck pain 02/11/2014   Neck strain 02/06/2012   Numbness and tingling in right hand 05/16/2018   Osteomyelitis (HCC)    Hattie Perch 03/05/2015   Osteomyelitis of toe of left foot (HCC) 03/05/2015   Palpitations 01/05/2016   Positive TB test    1980's had a reaction to TB test   Sleep apnea    Snoring 02/15/2021   Stroke (HCC) 08/2017   Throat pain 09/21/2011   Normal CT and US neck march 2013, repeat Neck US in 1 year  to follow-up small nodules.  Head CT 2013: normal ? glosspharyngeal neuralgia.  On amitriptyline await [facility] ENt referral.    Tubular adenoma of colon    Tubular adenoma of colon    Ulnar neuropathy at elbow of right upper extremity 06/12/2018   Family History  Problem Relation Age of Onset   Heart disease Mother        CAB age 53   Diabetes Mother    Cancer Father 74       pancreatic cancer   Stroke Father    Pancreatic cancer Sister    Diabetes Sister    Kidney disease Sister        renal failure   Diabetes Brother    Kidney disease Brother        renal failure   Breast cancer Maternal Aunt        per pt aunts and uncles died of cancer not sure the type   Breast cancer Paternal Aunt    Cancer Paternal Uncle    Cancer Sister    Esophageal cancer Neg Hx    Rectal cancer Neg Hx    Stomach cancer Neg Hx    Thyroid disease Neg Hx    Colon cancer Neg Hx    Colon polyps Neg Hx    Past Surgical History:  Procedure Laterality Date   ABDOMINAL HYSTERECTOMY  2006   AMPUTATION TOE Left 03/05/2015   Procedure: AMPUTATION LEFT 5TH  TOE;  Surgeon: Toni Arthurs, MD;  Location: [facility] OR;  Service: Orthopedics;  Laterality: Left;   APPENDECTOMY  2006   BREAST BIOPSY Left  11/26/2014   CESAREAN SECTION  1979 1986 1990   CHOLECYSTECTOMY     COLONOSCOPY     GALLBLADDER SURGERY     LOOP RECORDER INSERTION N/A 09/18/2017   Procedure: LOOP RECORDER INSERTION;  Surgeon: Marinus Maw, MD;  Location: [facility] INVASIVE CV LAB;  Service: Cardiovascular;  Laterality: N/A;   OPEN REDUCTION INTERNAL FIXATION (ORIF) DISTAL RADIAL FRACTURE Right 09/14/2019   Procedure: OPEN REDUCTION INTERNAL FIXATION (ORIF)  RADIAL AND ULNA FRACTURE;  Surgeon: Bradly Bienenstock, MD;  Location: [facility] OR;  Service: Orthopedics;  Laterality: Right;   ORIF ULNAR FRACTURE Right 09/14/2019   RADIOLOGY WITH ANESTHESIA Left 08/16/2022   Procedure: MRI WITH LEFT SHOULDER WITHOUT CONTRAST,CERVICAL SPINE WITHOUT;  Surgeon: Radiologist, Medication, MD;  Location: [facility] OR;  Service: Radiology;  Laterality: Left;   TEE WITHOUT CARDIOVERSION N/A 09/18/2017   Procedure: TRANSESOPHAGEAL ECHOCARDIOGRAM (TEE);  Surgeon: Chrystie Nose, MD;  Location: [facility] ENDOSCOPY;  Service: Cardiovascular;  Laterality: N/A;   TOE AMPUTATION Left 03/05/2015   5th toe   TUBAL LIGATION Bilateral 1990   UPPER GASTROINTESTINAL ENDOSCOPY     Social History   Occupational History   Occupation: cosmetolgist  Tobacco Use   Smoking status: Former    Current packs/day: 0.00    Average packs/day: 1 pack/day for 36.0 years (36.0 ttl pk-yrs)    Types: Cigarettes    Start date: 09/23/1981    Quit date: 09/23/2017    Years since quitting: 5.3   Smokeless tobacco: Never   Tobacco comments:    Tobacco info given 03/01/17  Vaping Use   Vaping status: Never Used  Substance and Sexual Activity   Alcohol use: Never   Drug use: Never   Sexual activity: Not Currently    Birth control/protection: Post-menopausal, Surgical

## 2023-01-20 NOTE — Progress Notes (Unsigned)
Functional Pain Scale - descriptive words and definitions  Unmanageable (7)  Pain interferes with normal ADL's/nothing seems to help/sleep is very difficult/active distractions are very difficult to concentrate on. Severe range order  Average Pain 7  Neck pain on right side that radiates into the right shoulder. Wants to discuss injection due to previous stroke

## 2023-01-20 NOTE — Telephone Encounter (Signed)
Cervical spine, humerus and shoulder xray reviewed.  No changes in her cervical spine xray compared to prior. Shoulder and humeral xray without acute findings which can explain her arm pain. She has small osteochondroma on her right distal humeral metaphysis which is likely unrelated to her symptoms. She stated that her spine specialist reviewed this result and discussed with her. She will proceed with cervical intraarticular joint injection to treat her symptoms. Spine specialist will refer to PT she said. No further questions.

## 2023-01-30 ENCOUNTER — Ambulatory Visit (HOSPITAL_BASED_OUTPATIENT_CLINIC_OR_DEPARTMENT_OTHER): Payer: Medicare HMO | Admitting: Cardiovascular Disease

## 2023-01-30 ENCOUNTER — Encounter (HOSPITAL_BASED_OUTPATIENT_CLINIC_OR_DEPARTMENT_OTHER): Payer: Self-pay | Admitting: Cardiovascular Disease

## 2023-01-30 VITALS — BP 124/82 | HR 67 | Ht 65.0 in | Wt 192.7 lb

## 2023-01-30 DIAGNOSIS — E7800 Pure hypercholesterolemia, unspecified: Secondary | ICD-10-CM | POA: Diagnosis not present

## 2023-01-30 DIAGNOSIS — I2510 Atherosclerotic heart disease of native coronary artery without angina pectoris: Secondary | ICD-10-CM | POA: Diagnosis not present

## 2023-01-30 DIAGNOSIS — Z5181 Encounter for therapeutic drug level monitoring: Secondary | ICD-10-CM

## 2023-01-30 DIAGNOSIS — I739 Peripheral vascular disease, unspecified: Secondary | ICD-10-CM

## 2023-01-30 DIAGNOSIS — I5032 Chronic diastolic (congestive) heart failure: Secondary | ICD-10-CM

## 2023-01-30 DIAGNOSIS — I10 Essential (primary) hypertension: Secondary | ICD-10-CM

## 2023-01-30 LAB — COMPREHENSIVE METABOLIC PANEL
ALT: 36 IU/L — ABNORMAL HIGH (ref 0–32)
AST: 18 IU/L (ref 0–40)
Albumin: 4.4 g/dL (ref 3.8–4.9)
Alkaline Phosphatase: 94 IU/L (ref 44–121)
BUN/Creatinine Ratio: 17 (ref 12–28)
BUN: 18 mg/dL (ref 8–27)
Bilirubin Total: 0.2 mg/dL (ref 0.0–1.2)
CO2: 23 mmol/L (ref 20–29)
Calcium: 10.8 mg/dL — ABNORMAL HIGH (ref 8.7–10.3)
Chloride: 104 mmol/L (ref 96–106)
Creatinine, Ser: 1.03 mg/dL — ABNORMAL HIGH (ref 0.57–1.00)
Globulin, Total: 2.8 g/dL (ref 1.5–4.5)
Glucose: 87 mg/dL (ref 70–99)
Potassium: 4.4 mmol/L (ref 3.5–5.2)
Sodium: 142 mmol/L (ref 134–144)
Total Protein: 7.2 g/dL (ref 6.0–8.5)
eGFR: 62 mL/min/{1.73_m2} (ref >59)

## 2023-01-30 LAB — LIPID PANEL
Chol/HDL Ratio: 2.4 ratio (ref 0.0–4.4)
Cholesterol, Total: 107 mg/dL (ref 100–199)
HDL: 45 mg/dL (ref >39)
LDL Chol Calc (NIH): 48 mg/dL (ref 0–99)
Triglycerides: 64 mg/dL (ref 0–149)
VLDL Cholesterol Cal: 14 mg/dL (ref 5–40)

## 2023-01-30 NOTE — Progress Notes (Signed)
Cardiology Office Note:  .    Date:  01/30/2023  ID:  Teresa [name], DOB [birth year], MRN [id number] PCP: Teresa Eland, MD  [facility] Providers Cardiologist:  None Sleep Medicine:  Nicki Guadalajara, MD     History of Present Illness: .    Teresa [name] is a 62 y.o. female with stroke, hypertension, OSA on CPAP, palpitations, moderate LVH, and hyperthyroidism who presents for follow up.  Teresa [name] saw Teresa Pagan, MD, on 01/05/16.  At that appointment she reported occasional palpitations and burning in her chest.  She had an echo on 7/13 showed LVEF 55-60% with grade 2 diastolic dysfunction and mild MR.  [name] reported one year of palpitations and chest pain that occurred sporadically.  She was referred for [facility] 02/24/16 that revealed LVEF 47% and no ischemia.  At her appointment on 01/2016 her blood pressure was elevated so she was switched from metoprolol to carvedilol.  She was subsequently diagnosed with cervicalgia.  Teresa [name] presented 08/2017 with cryptogenic embolic stroke.  Echo at that time revealed LVEF 60 to 65% with grade 1 diastolic dysfunction and moderate aortic regurgitation She had a TEE that was negative for left atrial appendage thrombus and her aortic regurgitation was noted to be mild.  A loop recorder was implanted and no significant arrhythmias have been recorded thus far.  Amlodipine was discontinued due to swelling.  She was started on chlorthalidone.  Hydralazine was switched from qid to tid.  She saw our pharmacists 01/2018 and switched Zetia to Repatha.    Her blood pressure was poorly controlled, so Carvedilol was increased and HCTZ was added. She followed up with her pharmacist and HCTZ was switched to chlorthalidone. Spironolactone was added. Her PCP noted she had trouble affording her medicine.  She had episode of palpitations that lasted for several hours.  There have been no significant arrhythmias on her loop recorder.  She had renal artery  Dopplers 06/2020 that were negative for renal artery stenosis. She was working on diet and exercise and lost 10 pounds. She was unable to participate in PREP because she had to care for her granddaughter.    In 01/2021 her blood pressure was much better controlled.  We discussed starting a PCSK9 inhibitor and she wanted to think about it.  She saw Dr. Ladona [name] 06/2021 to have her ILR removed.  At her visit 07/2021 she had recently started her CPAP. Home blood pressures were running 110s-130s systolic. She was doing very well with healthy lifestyle changes. Her PCP initiated Zetia but she developed worsening leg cramps and fatigue. She complained of DOE and had an echo 06/2022 showing LVEF 55-60%, mild LVH, and grade 1 diastolic dysfunction.  Carotid dopplers 07/2022 showed mild narrowing of the left carotid artery (unchanged from prior), and no evidence of stenosis in the right carotid artery.     Today, she reports struggling with right arm and shoulder pain. Mostly bothersome at night when she sleeps. She is now trying injection therapy to her neck as prior injections in the right shoulder have been ineffective. In the past 3 weeks she has been unable to exercise much given limited ROM due to her right arm/shoulder issues. Otherwise she usually walks 3-4 miles in the gym, and also participates in aerobics classes. Every now and then she may have swelling in her left leg, but this has been stable. At this time she has completed two Inclisiran injections, last received 12/29/22. She confirms that most of her  diet is cooked either with air frying or steamed. She is tolerating her CPAP, with only occasional apneic episodes. She denies any palpitations, chest pain, shortness of breath, lightheadedness, headaches, syncope.  ROS:  Please see the history of present illness. All other systems are reviewed and negative.  (+) Right shoulder pain with limited ROM (+) Occasional LLE edema  Studies Reviewed: .        Echo   07/15/2022:  1. Left ventricular ejection fraction, by estimation, is 55 to 60%. Left  ventricular ejection fraction by 3D volume is 56 %. The left ventricle has  normal function. The left ventricle has no regional Lambe motion  abnormalities. There is mild left  ventricular hypertrophy. Left ventricular diastolic parameters are  consistent with Grade I diastolic dysfunction (impaired relaxation).   2. Right ventricular systolic function is normal. The right ventricular  size is normal. Tricuspid regurgitation signal is inadequate for assessing  PA pressure.   3. The mitral valve is degenerative. Trivial mitral valve regurgitation.  No evidence of mitral stenosis.   4. The aortic valve is grossly normal. Aortic valve regurgitation is  mild. No aortic stenosis is present.   5. The inferior vena cava is normal in size with greater than 50%  respiratory variability, suggesting right atrial pressure of 3 mmHg.   Risk Assessment/Calculations:             Physical Exam:    VS:  BP 124/82 (BP Location: Left Arm, Patient Position: Sitting, Cuff Size: Normal)   Pulse 67   Ht 5\' 5"  (1.651 m)   Wt 192 lb 11.2 oz (87.4 kg)   BMI 32.07 kg/m  , BMI Body mass index is 32.07 kg/m. GENERAL:  Well appearing HEENT: Pupils equal round and reactive, fundi not visualized, oral mucosa unremarkable NECK:  No jugular venous distention, waveform within normal limits, carotid upstroke brisk and symmetric, no bruits, no thyromegaly LUNGS:  Clear to auscultation bilaterally HEART:  RRR.  PMI not displaced or sustained,S1 and S2 within normal limits, no S3, no S4, no clicks, no rubs, no murmurs ABD:  Flat, positive bowel sounds normal in frequency in pitch, no bruits, no rebound, no guarding, no midline pulsatile mass, no hepatomegaly, no splenomegaly EXT:  2 plus pulses throughout, no edema, no cyanosis no clubbing SKIN:  No rashes no nodules NEURO:  Cranial nerves II through XII grossly intact, motor grossly  intact throughout PSYCH:  Cognitively intact, oriented to person place and time  Wt Readings from Last 3 Encounters:  01/30/23 192 lb 11.2 oz (87.4 kg)  01/17/23 194 lb 12.8 oz (88.4 kg)  01/16/23 195 lb 6.4 oz (88.6 kg)     ASSESSMENT AND PLAN: .       # Hyperlipidemia On Inclisiran with no reported side effects. Last injection was in July 2024. -Check lipid panel and comprehensive metabolic panel today.  # Hypertension Blood pressure well controlled on Carvedilol, Chlorthalidone, and Hydralazine.  Encourage 150 minutes/week of exercise.  -Continue current medications.  # Obesity Patient has lost weight and continues to exercise when able. -Encourage continued weight loss and regular exercise.  # Sleep Apnea Patient is using CPAP regularly with good results. -Continue CPAP use.   # Musculoskeletal Pain Persistent pain in the right arm, worse at night. Previous injections in the shoulder and neck have not provided relief. -Consider referral to orthopedics for further evaluation and management.  Follow-up in 1 year, or sooner if needed.  Dispo:  FU with  C. Duke Salvia, MD, [facility] in 1 year.  I,Mathew Stumpf,acting as a Neurosurgeon for Chilton Si, MD.,have documented all relevant documentation on the behalf of Chilton Si, MD,as directed by  Chilton Si, MD while in the presence of Chilton Si, MD.  I,  C. Duke Salvia, MD have reviewed all documentation for this visit.  The documentation of the exam, diagnosis, procedures, and orders on 01/30/2023 are all accurate and complete.   Signed, Carlena Bjornstad

## 2023-01-30 NOTE — Patient Instructions (Signed)
Medication Instructions:  Your physician recommends that you continue on your current medications as directed. Please refer to the Current Medication list given to you today.    *If you need a refill on your cardiac medications before your next appointment, please call your pharmacy*  Lab Work: LP/CMET TODAY   If you have labs (blood work) drawn today and your tests are completely normal, you will receive your results only by: MyChart Message (if you have MyChart) OR A paper copy in the mail If you have any lab test that is abnormal or we need to change your treatment, we will call you to review the results.  Testing/Procedures: NONE  Follow-Up: At [facility], you and your health needs are our priority.  As part of our continuing mission to provide you with exceptional heart care, we have created designated Provider Care Teams.  These Care Teams include your primary Cardiologist (physician) and Advanced Practice Providers (APPs -  Physician Assistants and Nurse Practitioners) who all work together to provide you with the care you need, when you need it.  We recommend signing up for the patient portal called "MyChart".  Sign up information is provided on this After Visit Summary.  MyChart is used to connect with patients for Virtual Visits (Telemedicine).  Patients are able to view lab/test results, encounter notes, upcoming appointments, etc.  Non-urgent messages can be sent to your provider as well.   To learn more about what you can do with MyChart, go to [email or web address].    Your next appointment:   12 month(s)  Provider:   Chilton Si, MD or Gillian Shields, NP

## 2023-02-08 ENCOUNTER — Other Ambulatory Visit: Payer: Self-pay | Admitting: Family Medicine

## 2023-02-08 ENCOUNTER — Encounter: Payer: Self-pay | Admitting: Family Medicine

## 2023-02-13 ENCOUNTER — Other Ambulatory Visit: Payer: Self-pay

## 2023-02-13 ENCOUNTER — Ambulatory Visit (INDEPENDENT_AMBULATORY_CARE_PROVIDER_SITE_OTHER): Payer: Medicare HMO | Admitting: Physical Medicine and Rehabilitation

## 2023-02-13 VITALS — BP 125/76 | HR 84

## 2023-02-13 DIAGNOSIS — M5412 Radiculopathy, cervical region: Secondary | ICD-10-CM | POA: Diagnosis not present

## 2023-02-13 MED ORDER — METHYLPREDNISOLONE ACETATE 80 MG/ML IJ SUSP
80.0000 mg | Freq: Once | INTRAMUSCULAR | Status: AC
Start: 2023-02-13 — End: 2023-02-13
  Administered 2023-02-13: 80 mg

## 2023-02-13 NOTE — Progress Notes (Unsigned)
Functional Pain Scale - descriptive words and definitions  Unmanageable (7)  Pain interferes with normal ADL's/nothing seems to help/sleep is very difficult/active distractions are very difficult to concentrate on. Severe range order  Average Pain 3   +Driver, -BT, -Dye Allergies.  Neck pain on right side that radiates into the upper right arm. Ice helps ease the pain

## 2023-02-13 NOTE — Patient Instructions (Signed)

## 2023-02-14 ENCOUNTER — Telehealth: Payer: Self-pay | Admitting: Physical Medicine and Rehabilitation

## 2023-02-14 NOTE — Procedures (Signed)
Cervical Epidural Steroid Injection - Interlaminar Approach with Fluoroscopic Guidance  Patient: Teresa [name]      Date of Birth: February 25, 1961 MRN: [id number] PCP: Doreene Eland, MD      Visit Date: 02/13/2023   Universal Protocol:    Date/Time: 08/20/244:58 PM  Consent Given By: the patient  Position: PRONE  Additional Comments: Vital signs were monitored before and after the procedure. Patient was prepped and draped in the usual sterile fashion. The correct patient, procedure, and site was verified.   Injection Procedure Details:   Procedure diagnoses: Cervical radiculopathy [M54.12]    Meds Administered:  Meds ordered this encounter  Medications   methylPREDNISolone acetate (DEPO-MEDROL) injection 80 mg     Laterality: Right  Location/Site: C7-T1  Needle: 3.5 in., 20 ga. Tuohy  Needle Placement: Paramedian epidural space  Findings:  -Comments: Excellent flow of contrast into the epidural space.  Procedure Details: Using a paramedian approach from the side mentioned above, the region overlying the inferior lamina was localized under fluoroscopic visualization and the soft tissues overlying this structure were infiltrated with 4 ml. of 1% Lidocaine without Epinephrine. A # 20 gauge, Tuohy needle was inserted into the epidural space using a paramedian approach.  The epidural space was localized using loss of resistance along with contralateral oblique bi-planar fluoroscopic views.  After negative aspirate for air, blood, and CSF, a 2 ml. volume of Isovue-250 was injected into the epidural space and the flow of contrast was observed. Radiographs were obtained for documentation purposes.   The injectate was administered into the level noted above.  Additional Comments:  No complications occurred Dressing: 2 x 2 sterile gauze and Band-Aid    Post-procedure details: Patient was observed during the procedure. Post-procedure instructions were reviewed.  Patient  left the clinic in stable condition.

## 2023-02-14 NOTE — Telephone Encounter (Signed)
Patient underwent right C7-T1 on 02/14/2023 in our office, now reports pain to left side of scalp and behind left ear, describes pain as sharp. She denies headache. States her pain does resolve significantly with massage and OTC medications. I advised patient to continue with OTC medications, ice and let us know if her pain increases.

## 2023-02-14 NOTE — Progress Notes (Signed)
Teresa [name] - 62 y.o. female MRN [id number]  Date of birth: September 27, 1960  Office Visit Note: Visit Date: 02/13/2023 PCP: Doreene Eland, MD Referred by: Doreene Eland, MD  Subjective: Chief Complaint  Patient presents with   Neck - Pain   HPI:  Teresa [name] is a 62 y.o. female who comes in today at the request of Ellin Goodie, FNP for planned Right C7-T1 Cervical Interlaminar epidural steroid injection with fluoroscopic guidance.  The patient has failed conservative care including home exercise, medications, time and activity modification.  This injection will be diagnostic and hopefully therapeutic.  Please see requesting physician notes for further details and justification.   ROS Otherwise per HPI.  Assessment & Plan: Visit Diagnoses:    ICD-10-CM   1. Cervical radiculopathy  M54.12 XR C-ARM NO REPORT    Epidural Steroid injection    methylPREDNISolone acetate (DEPO-MEDROL) injection 80 mg      Plan: No additional findings.   Meds & Orders:  Meds ordered this encounter  Medications   methylPREDNISolone acetate (DEPO-MEDROL) injection 80 mg    Orders Placed This Encounter  Procedures   XR C-ARM NO REPORT   Epidural Steroid injection    Follow-up: Return for visit to requesting provider as needed.   Procedures: No procedures performed  Cervical Epidural Steroid Injection - Interlaminar Approach with Fluoroscopic Guidance  Patient: Teresa [name]      Date of Birth: 1960/06/30 MRN: [id number] PCP: Doreene Eland, MD      Visit Date: 02/13/2023   Universal Protocol:    Date/Time: 08/20/244:58 PM  Consent Given By: the patient  Position: PRONE  Additional Comments: Vital signs were monitored before and after the procedure. Patient was prepped and draped in the usual sterile fashion. The correct patient, procedure, and site was verified.   Injection Procedure Details:   Procedure diagnoses: Cervical radiculopathy [M54.12]    Meds  Administered:  Meds ordered this encounter  Medications   methylPREDNISolone acetate (DEPO-MEDROL) injection 80 mg     Laterality: Right  Location/Site: C7-T1  Needle: 3.5 in., 20 ga. Tuohy  Needle Placement: Paramedian epidural space  Findings:  -Comments: Excellent flow of contrast into the epidural space.  Procedure Details: Using a paramedian approach from the side mentioned above, the region overlying the inferior lamina was localized under fluoroscopic visualization and the soft tissues overlying this structure were infiltrated with 4 ml. of 1% Lidocaine without Epinephrine. A # 20 gauge, Tuohy needle was inserted into the epidural space using a paramedian approach.  The epidural space was localized using loss of resistance along with contralateral oblique bi-planar fluoroscopic views.  After negative aspirate for air, blood, and CSF, a 2 ml. volume of Isovue-250 was injected into the epidural space and the flow of contrast was observed. Radiographs were obtained for documentation purposes.   The injectate was administered into the level noted above.  Additional Comments:  No complications occurred Dressing: 2 x 2 sterile gauze and Band-Aid    Post-procedure details: Patient was observed during the procedure. Post-procedure instructions were reviewed.  Patient left the clinic in stable condition.   Clinical History: MRI CERVICAL SPINE WITHOUT CONTRAST   TECHNIQUE: Multiplanar, multisequence MR imaging of the cervical spine was performed. No intravenous contrast was administered.   COMPARISON:  Prior radiograph from 05/10/2022 as well as prior MRI from 02/18/2016.   FINDINGS: Alignment: Straightening of the normal cervical lordosis. No listhesis.   Vertebrae: Vertebral body height maintained without acute or chronic fracture.  Bone marrow signal intensity within normal limits. Subcentimeter benign hemangioma noted within the T3 vertebral body. No worrisome  osseous lesions. No abnormal marrow edema.   Cord: Normal signal and morphology.   Posterior Fossa, vertebral arteries, paraspinal tissues: Small remote lacunar infarct noted within the pons. Visualized brain and posterior fossa otherwise unremarkable. Craniocervical junction normal. Paraspinous soft tissues within normal limits. Normal flow voids seen within the vertebral arteries bilaterally.   Disc levels:   C2-C3: Unremarkable.   C3-C4: Central disc protrusion indents the ventral thecal sac, contacting and flattening the ventral cord (series 6, image 22). Slight superior and inferior migration of disc material noted. No cord signal changes. Resultant mild-to-moderate spinal stenosis. Superimposed uncovertebral spurring with resultant mild bilateral C4 foraminal stenosis.   C4-C5: Small right paracentral disc protrusion indents the ventral thecal sac (series 7, image 13). Associated annular fissure. Mild spinal stenosis without frank cord impingement. Foramina remain patent.   C5-C6: Central to right paracentral disc protrusion indents the ventral thecal sac (series 6, image 48). Slight inferior migration. Minimal cord flattening without cord signal changes. Mild spinal stenosis. Superimposed bilateral uncovertebral spurring with resultant moderate bilateral C6 foraminal stenosis.   C6-C7: Mild disc bulge with uncovertebral spurring. No spinal stenosis. Foramina remain patent.   C7-T1: Normal interspace. Mild bilateral facet hypertrophy. No significant canal or foraminal stenosis.   IMPRESSION: 1. Central disc protrusion at C3-4 with resultant mild-to-moderate spinal stenosis and mild cord flattening, but no cord signal changes. 2. Small right paracentral disc protrusion at C4-5 with resultant mild spinal stenosis. 3. Central to right paracentral disc protrusion with uncovertebral spurring at C5-6 with resultant mild spinal stenosis, with moderate bilateral C6  foraminal narrowing.     Electronically Signed   By: Rise Mu M.D.   On: 08/16/2022 12:44     Objective:  VS:  HT:    WT:   BMI:     BP:125/76  HR:84bpm  TEMP: ( )  RESP:  Physical Exam Vitals and nursing note reviewed.  Constitutional:      General: She is not in acute distress.    Appearance: Normal appearance. She is not ill-appearing.  HENT:     Head: Normocephalic and atraumatic.     Right Ear: External ear normal.     Left Ear: External ear normal.  Eyes:     Extraocular Movements: Extraocular movements intact.  Cardiovascular:     Rate and Rhythm: Normal rate.     Pulses: Normal pulses.  Musculoskeletal:     Cervical back: Tenderness present. No rigidity.     Right lower leg: No edema.     Left lower leg: No edema.     Comments: Patient has good strength in the upper extremities including 5 out of 5 strength in wrist extension long finger flexion and APB.  There is no atrophy of the hands intrinsically.  There is a negative Hoffmann's test.   Lymphadenopathy:     Cervical: No cervical adenopathy.  Skin:    Findings: No erythema, lesion or rash.  Neurological:     General: No focal deficit present.     Mental Status: She is alert and oriented to person, place, and time.     Sensory: No sensory deficit.     Motor: No weakness or abnormal muscle tone.     Coordination: Coordination normal.  Psychiatric:        Mood and Affect: Mood normal.        Behavior: Behavior normal.  Imaging: No results found.

## 2023-02-16 ENCOUNTER — Ambulatory Visit: Payer: Medicare HMO | Admitting: Family Medicine

## 2023-02-22 ENCOUNTER — Telehealth: Payer: Self-pay | Admitting: Physical Medicine and Rehabilitation

## 2023-02-22 NOTE — Telephone Encounter (Signed)
Patient called. Says her R arm is still hurting. Would like a call. [phone]

## 2023-02-23 ENCOUNTER — Other Ambulatory Visit: Payer: Self-pay | Admitting: Physical Medicine and Rehabilitation

## 2023-02-23 NOTE — Progress Notes (Signed)
Spoke with patient this morning, recent right C7-T1 interlaminar epidural steroid injection on 02/13/2023, she reports greater than 50% relief of pain for 1 week. I offered repeat injection, however she would like to try regimen of physical therapy. She recently started new job and will get back to [place] about PT. I also offered to prescribe Lyrica, however she does not wish to try medications at this time.

## 2023-02-25 ENCOUNTER — Other Ambulatory Visit: Payer: Self-pay | Admitting: Family Medicine

## 2023-02-26 ENCOUNTER — Ambulatory Visit
Admission: RE | Admit: 2023-02-26 | Discharge: 2023-02-26 | Disposition: A | Discharge status: Home / Self Care | Payer: Medicare HMO | Source: Ambulatory Visit | Attending: Internal Medicine | Admitting: Internal Medicine

## 2023-02-26 VITALS — BP 124/81 | HR 90 | Temp 99.2°F | Resp 20

## 2023-02-26 DIAGNOSIS — J309 Allergic rhinitis, unspecified: Secondary | ICD-10-CM

## 2023-02-26 DIAGNOSIS — U071 COVID-19: Secondary | ICD-10-CM | POA: Diagnosis not present

## 2023-02-26 DIAGNOSIS — B349 Viral infection, unspecified: Secondary | ICD-10-CM | POA: Diagnosis not present

## 2023-02-26 DIAGNOSIS — I5032 Chronic diastolic (congestive) heart failure: Secondary | ICD-10-CM | POA: Insufficient documentation

## 2023-02-26 MED ORDER — PREDNISONE 20 MG PO TABS: ORAL_TABLET | ORAL | 0 refills | Status: DC

## 2023-02-26 MED ORDER — PROMETHAZINE-DM 6.25-15 MG/5ML PO SYRP: 5.0000 mL | ORAL_SOLUTION | Freq: Three times a day (TID) | ORAL | 0 refills | Status: DC | PRN

## 2023-02-26 MED ORDER — CETIRIZINE HCL 10 MG PO TABS: 10.0000 mg | ORAL_TABLET | Freq: Every day | ORAL | 0 refills | Status: DC

## 2023-02-26 NOTE — ED Provider Notes (Signed)
[facility]  Note:  This document was prepared using Conservation officer, historic buildings and may include unintentional dictation errors.  MRN: [id number] DOB: [birth year]  Subjective:   Teresa [name] is a 62 y.o. female presenting for 3-day history of acute onset persistent sinus congestion, throat pain, hoarseness, productive cough, left ear pain, headaches.  Has been using Vick's cough drops, Epsom salt baths, Tylenol 3.  Has typically done well with prednisone but does not want to do a taper.  Patient has type 2 diabetes that is well-controlled.  Has chronic diastolic congestive heart failure that is also well-controlled, last echocardiogram from this year showed an ejection fraction of 55 to 60%.  No smoking of any kind including cigarettes, cigars, vaping, marijuana use.  However she does have a 30-pack-year smoking history.  No current facility-administered medications for this encounter.  Current Outpatient Medications:    acetaminophen-codeine (TYLENOL #3) 300-30 MG tablet, Take 1 tablet by mouth 2 (two) times daily as needed for moderate pain., Disp: 20 tablet, Rfl: 2   aspirin 81 MG chewable tablet, Chew 1 tablet (81 mg total) by mouth daily., Disp: 30 tablet, Rfl: 0   baclofen (LIORESAL) 10 MG tablet, Take 1 tablet (10 mg total) by mouth at bedtime as needed for muscle spasms., Disp: 30 each, Rfl: 0   Carboxymeth-Glyc-Polysorb PF (REFRESH OPTIVE MEGA-3) 0.5-1-0.5 % SOLN, Place 1 drop into both eyes 2 (two) times daily., Disp: , Rfl:    carvedilol (COREG) 25 MG tablet, TAKE 1 TABLET BY MOUTH TWICE DAILY WITH MEALS, Disp: 180 tablet, Rfl: 2   CEQUA 0.09 % SOLN, Apply 1 drop to eye 2 (two) times daily., Disp: , Rfl:    chlorthalidone (HYGROTON) 25 MG tablet, Take 1 tablet by mouth once daily, Disp: 90 tablet, Rfl: 1   diazepam (VALIUM) 5 MG tablet, Take one tablet by mouth with food one hour prior to procedure. May repeat 30 minutes prior if needed., Disp: 2 tablet,  Rfl: 0   diclofenac Sodium (VOLTAREN) 1 % GEL, Apply 2 g topically 4 (four) times daily. Apply to shoulder, Disp: 100 g, Rfl: 1   hydrALAZINE (APRESOLINE) 100 MG tablet, Take 1 tablet (100 mg total) by mouth 3 (three) times daily., Disp: 270 tablet, Rfl: 1   inclisiran (LEQVIO) 284 MG/1.5ML SOSY injection, Inject 284 mg into the skin once., Disp: , Rfl:    levothyroxine (SYNTHROID) 175 MCG tablet, TAKE 1 TABLET BY MOUTH ONCE DAILY IN THE MORNING 30  MINUTES  BEFORE  FOOD, Disp: 90 tablet, Rfl: 1   Semaglutide, 1 MG/DOSE, (OZEMPIC, 1 MG/DOSE,) 4 MG/3ML SOPN, INJECT 1 MG SUBCUTANEOUSLY ONCE A WEEK, Disp: 3 mL, Rfl: 1   spironolactone (ALDACTONE) 50 MG tablet, Take 1 tablet by mouth once daily, Disp: 90 tablet, Rfl: 1   Allergies  Allergen Reactions   Amlodipine Swelling and Other (See Comments)    Leg swelling   Bactrim [Sulfamethoxazole-Trimethoprim] Anaphylaxis   Clonidine Derivatives Swelling and Other (See Comments)    Facial swelling   Lisinopril Swelling and Other (See Comments)    Lip swelling   Statins Swelling    Body and muscle aches and face/lip swelling on Statins (Lipitor, Pravachol)   Aspirin Nausea Only    Doses higher than 81mg  only   Latex Rash    NO POWDERED GLOVES!!    Past Medical History:  Diagnosis Date   Allergy    Anemia    Cervical disc disorder with radiculopathy of cervical region  02/10/2016   Chronic lower back pain    Claudication in peripheral vascular disease (HCC) 06/12/2020   Diverticulosis    Ear discomfort 03/11/2016   EKG, abnormal 01/06/2017   Gastritis    GERD (gastroesophageal reflux disease)    history   Graves' disease    2005   H/O hiatal hernia    Hiatal hernia    History of amputation of lesser toe of left foot (HCC) 03/11/2015   History of colon cancer    Hypertension    Hyperthyroidism    hx 2005   Hypothyroidism    Lateral knee pain, left 08/06/2013   Left shoulder pain 05/28/2015   Migraine headache    "a few times/yr"  (03/05/2015)   Neck pain 02/11/2014   Neck strain 02/06/2012   Numbness and tingling in right hand 05/16/2018   Osteomyelitis (HCC)    Hattie Perch 03/05/2015   Osteomyelitis of toe of left foot (HCC) 03/05/2015   Palpitations 01/05/2016   Positive TB test    1980's had a reaction to TB test   Sleep apnea    Snoring 02/15/2021   Stroke (HCC) 08/2017   Throat pain 09/21/2011   Normal CT and US neck march 2013, repeat Neck US in 1 year to follow-up small nodules.  Head CT 2013: normal ? glosspharyngeal neuralgia.  On amitriptyline await [facility] ENt referral.    Tubular adenoma of colon    Tubular adenoma of colon    Ulnar neuropathy at elbow of right upper extremity 06/12/2018     Past Surgical History:  Procedure Laterality Date   ABDOMINAL HYSTERECTOMY  2006   AMPUTATION TOE Left 03/05/2015   Procedure: AMPUTATION LEFT 5TH  TOE;  Surgeon: Toni Arthurs, MD;  Location: [facility] OR;  Service: Orthopedics;  Laterality: Left;   APPENDECTOMY  2006   BREAST BIOPSY Left 11/26/2014   CESAREAN SECTION  1979 1986 1990   CHOLECYSTECTOMY     COLONOSCOPY     GALLBLADDER SURGERY     LOOP RECORDER INSERTION N/A 09/18/2017   Procedure: LOOP RECORDER INSERTION;  Surgeon: Marinus Maw, MD;  Location: [facility];  Service: Cardiovascular;  Laterality: N/A;   OPEN REDUCTION INTERNAL FIXATION (ORIF) DISTAL RADIAL FRACTURE Right 09/14/2019   Procedure: OPEN REDUCTION INTERNAL FIXATION (ORIF)  RADIAL AND ULNA FRACTURE;  Surgeon: Bradly Bienenstock, MD;  Location: [facility] OR;  Service: Orthopedics;  Laterality: Right;   ORIF ULNAR FRACTURE Right 09/14/2019   RADIOLOGY WITH ANESTHESIA Left 08/16/2022   Procedure: MRI WITH LEFT SHOULDER WITHOUT CONTRAST,CERVICAL SPINE WITHOUT;  Surgeon: Radiologist, Medication, MD;  Location: [facility] OR;  Service: Radiology;  Laterality: Left;   TEE WITHOUT CARDIOVERSION N/A 09/18/2017   Procedure: TRANSESOPHAGEAL ECHOCARDIOGRAM (TEE);  Surgeon: Chrystie Nose, MD;  Location: [facility];   Service: Cardiovascular;  Laterality: N/A;   TOE AMPUTATION Left 03/05/2015   5th toe   TUBAL LIGATION Bilateral 1990   UPPER GASTROINTESTINAL ENDOSCOPY      Family History  Problem Relation Age of Onset   Heart disease Mother        CAB age 100   Diabetes Mother    Cancer Father 67       pancreatic cancer   Stroke Father    Pancreatic cancer Sister    Diabetes Sister    Kidney disease Sister        renal failure   Diabetes Brother    Kidney disease Brother        renal failure  Breast cancer Maternal Aunt        per pt aunts and uncles died of cancer not sure the type   Breast cancer Paternal Aunt    Cancer Paternal Uncle    Cancer Sister    Esophageal cancer Neg Hx    Rectal cancer Neg Hx    Stomach cancer Neg Hx    Thyroid disease Neg Hx    Colon cancer Neg Hx    Colon polyps Neg Hx     Social History   Tobacco Use   Smoking status: Former    Current packs/day: 0.00    Average packs/day: 1 pack/day for 36.0 years (36.0 ttl pk-yrs)    Types: Cigarettes    Start date: 09/23/1981    Quit date: 09/23/2017    Years since quitting: 5.4   Smokeless tobacco: Never   Tobacco comments:    Tobacco info given 03/01/17  Vaping Use   Vaping status: Never Used  Substance Use Topics   Alcohol use: Never   Drug use: Never    ROS   Objective:   Vitals: BP 124/81 (BP Location: Right Arm)   Pulse 90   Temp 99.2 F (37.3 C) (Oral)   Resp 20   SpO2 95%   Physical Exam Constitutional:      General: She is not in acute distress.    Appearance: Normal appearance. She is well-developed and normal weight. She is not ill-appearing, toxic-appearing or diaphoretic.  HENT:     Head: Normocephalic and atraumatic.     Right Ear: Tympanic membrane, ear canal and external ear normal. No drainage or tenderness. No middle ear effusion. There is no impacted cerumen. Tympanic membrane is not erythematous or bulging.     Left Ear: Tympanic membrane, ear canal and external ear  normal. No drainage or tenderness.  No middle ear effusion. There is no impacted cerumen. Tympanic membrane is not erythematous or bulging.     Nose: Congestion and rhinorrhea present.     Mouth/Throat:     Mouth: Mucous membranes are moist. No oral lesions.     Pharynx: No pharyngeal swelling, oropharyngeal exudate, posterior oropharyngeal erythema or uvula swelling.     Tonsils: No tonsillar exudate or tonsillar abscesses.  Eyes:     General: No scleral icterus.       Right eye: No discharge.        Left eye: No discharge.     Extraocular Movements: Extraocular movements intact.     Right eye: Normal extraocular motion.     Left eye: Normal extraocular motion.     Conjunctiva/sclera: Conjunctivae normal.  Cardiovascular:     Rate and Rhythm: Normal rate and regular rhythm.     Heart sounds: Normal heart sounds. No murmur heard.    No friction rub. No gallop.  Pulmonary:     Effort: Pulmonary effort is normal. No respiratory distress.     Breath sounds: No stridor. No wheezing, rhonchi or rales.  Chest:     Chest Pruiett: No tenderness.  Musculoskeletal:     Cervical back: Normal range of motion and neck supple.  Lymphadenopathy:     Cervical: No cervical adenopathy.  Skin:    General: Skin is warm and dry.  Neurological:     General: No focal deficit present.     Mental Status: She is alert and oriented to person, place, and time.  Psychiatric:        Mood and Affect: Mood normal.  Behavior: Behavior normal.     Assessment and Plan :   PDMP not reviewed this encounter.  1. Acute viral syndrome   2. Allergic rhinitis, unspecified seasonality, unspecified trigger     Deferred imaging given clear cardiopulmonary exam, hemodynamically stable vital signs. Patient does better with prednisone, will use a 5 day 40mg  course. Otherwise, will manage for viral illness such as viral URI, viral syndrome, viral rhinitis, COVID-19. Recommended supportive care. Offered scripts for  symptomatic relief. Testing is pending. Counseled patient on potential for adverse effects with medications prescribed/recommended today, ER and return-to-clinic precautions discussed, patient verbalized understanding.   Paxlovid would be appropriate, GFR is >60 from this past month. Hold Valium.    Wallis Bamberg, [place] 02/26/23 1446

## 2023-02-26 NOTE — Discharge Instructions (Addendum)
We will notify you of your test results as they arrive and may take between about 24 hours.  I encourage you to sign up for MyChart if you have not already done so as this can be the easiest way for us to communicate results to you online or through a phone app.  Generally, we only contact you if it is a positive test result.  In the meantime, if you develop worsening symptoms including fever, chest pain, shortness of breath despite our current treatment plan then please report to the emergency room as this may be a sign of worsening status from possible viral infection.  Otherwise, we will manage this as a viral syndrome. For sore throat or cough try using a honey-based tea. Use 3 teaspoons of honey with juice squeezed from half lemon. Place shaved pieces of ginger into 1/2-1 cup of water and warm over stove top. Then mix the ingredients and repeat every 4 hours as needed. Please take Tylenol 650mg every 6 hours for aches and pains, fevers. Hydrate very well with at least 2 liters of water. Eat light meals such as soups to replenish electrolytes and soft fruits, veggies. Start an antihistamine like Zyrtec (10mg daily) for postnasal drainage, sinus congestion.  You can take this together with prednisone.  Use the cough medications as needed.   

## 2023-02-26 NOTE — ED Triage Notes (Signed)
Pt c/o prod cough, nasal congestion, sore throat, HA, left earache-sx started 8/29-NAD-steady gait

## 2023-02-27 LAB — SARS CORONAVIRUS 2 (TAT 6-24 HRS): SARS Coronavirus 2: POSITIVE — AB

## 2023-02-28 ENCOUNTER — Encounter: Payer: Self-pay | Admitting: Family Medicine

## 2023-02-28 ENCOUNTER — Telehealth: Payer: Self-pay

## 2023-02-28 ENCOUNTER — Telehealth: Payer: Self-pay | Admitting: Nurse Practitioner

## 2023-02-28 DIAGNOSIS — U071 COVID-19: Secondary | ICD-10-CM

## 2023-02-28 MED ORDER — PAXLOVID (300/100) 20 X 150 MG & 10 X 100MG PO TBPK
3.0000 | ORAL_TABLET | Freq: Two times a day (BID) | ORAL | 0 refills | Status: AC
Start: 2023-02-28 — End: 2023-03-05

## 2023-02-28 NOTE — Telephone Encounter (Signed)
Patient seen in urgent care on 9/1 for COVID symptoms.  Positive COVID PCR.  Requesting Paxlovid.  Per provider note does qualify.  Medications reviewed as well as recent labs from August 2024.  GFR greater than 60.  Patient to hold Valium while taking Paxlovid as well for 3 days after completion.  Seen staff to inform patient.

## 2023-02-28 NOTE — Telephone Encounter (Signed)
Pt called in requesting to have Paxlovid sent to [facility] on [address]. Pt states she is COVID positive per My Chart results.

## 2023-03-03 ENCOUNTER — Ambulatory Visit (INDEPENDENT_AMBULATORY_CARE_PROVIDER_SITE_OTHER): Payer: Medicare HMO | Admitting: Family Medicine

## 2023-03-03 ENCOUNTER — Encounter: Payer: Self-pay | Admitting: Family Medicine

## 2023-03-03 DIAGNOSIS — E1169 Type 2 diabetes mellitus with other specified complication: Secondary | ICD-10-CM

## 2023-03-03 DIAGNOSIS — Z89422 Acquired absence of other left toe(s): Secondary | ICD-10-CM

## 2023-03-03 DIAGNOSIS — E118 Type 2 diabetes mellitus with unspecified complications: Secondary | ICD-10-CM | POA: Diagnosis not present

## 2023-03-03 DIAGNOSIS — U071 COVID-19: Secondary | ICD-10-CM | POA: Diagnosis not present

## 2023-03-03 NOTE — Assessment & Plan Note (Signed)
Bmet repeated I will contact her soon with her test results

## 2023-03-03 NOTE — Patient Instructions (Signed)
Hypercalcemia Hypercalcemia is when the level of calcium in a person's blood is above normal. The body needs calcium to make bones and keep them strong. Calcium also helps the muscles, nerves, brain, and heart work the way they should. Most of the calcium in the body is stored in the bones. There is also calcium in the blood. Hypercalcemia occurs when there is too much calcium in your blood. Calcium levels in the blood are regulated by hormones, kidneys, and the gastrointestinal tract.  Hypercalcemia can happen when calcium comes out of the bones, or when the kidneys are not able to remove calcium from the blood. Hypercalcemia can be mild or severe. What are the causes? There are many possible causes of hypercalcemia. Common causes of this condition include: Hyperparathyroidism. This is a condition in which the body produces too much parathyroid hormone. There are four parathyroid glands in your neck. These glands produce a chemical messenger (hormone) that helps the body absorb calcium from foods and helps your bones release calcium. Certain kinds of cancer. Less common causes of hypercalcemia include: Calcium and vitamin D dietary supplements. Chronic kidney disease. Hyperthyroidism. Severe dehydration. Being on bed rest or being inactive for a long time. Certain medicines. Infections. What increases the risk? You are more likely to develop this condition if: You are female. You are 61 years of age or older. You have a family history of hypercalcemia. What are the signs or symptoms? Mild hypercalcemia that starts slowly may not cause symptoms. Severe, sudden hypercalcemia is more likely to cause symptoms, such as: Being more thirsty than usual. Needing to urinate more often than usual. Abdominal pain. Nausea and vomiting. Constipation. Muscle pain, twitching, or weakness. Feeling very tired. How is this diagnosed?  Hypercalcemia is usually diagnosed with a blood test. You may also  have tests to help check what is causing this condition. Tests include imaging tests and more blood tests. How is this treated? Treatment for hypercalcemia depends on the cause. Treatment may include: Receiving fluids through an IV. Medicines. These can be used to: Keep calcium levels steady after receiving fluids (loop diuretics). Keep calcium in your bones (bisphosphonates). Lower the calcium level in your blood. Surgery to remove overactive parathyroid glands. A procedure that filters your blood to correct calcium levels (hemodialysis). Follow these instructions at home:  Take over-the-counter and prescription medicines only as told by your health care provider. Follow instructions from your health care provider about eating or drinking restrictions. Drink enough fluid to keep your urine pale yellow. Stay active. Weight-bearing exercise helps to keep calcium in your bones. Follow instructions from your health care provider about what type and level of exercise is safe for you. Keep all follow-up visits. This is important. Contact a health care provider if: You have a fever. Your heartbeat is irregular or very fast. You have changes in mood, memory, or personality. Get help right away if: You have severe abdominal pain. You have chest pain. You have trouble breathing. You become very confused and sleepy. You lose consciousness. These symptoms may represent a serious problem that is an emergency. Do not wait to see if the symptoms will go away. Get medical help right away. Call your local emergency services (911 in the [place] Do not drive yourself to the hospital. Summary Hypercalcemia is when the level of calcium in a person's blood is above normal. The body needs calcium to make bones and keep them strong. There are many possible causes of hypercalcemia, and treatment depends on  the cause. Take over-the-counter and prescription medicines only as told by your health care  provider. This information is not intended to replace advice given to you by your health care provider. Make sure you discuss any questions you have with your health care provider. Document Revised: 11/18/2020 Document Reviewed: 11/18/2020 Elsevier Patient Education  2024 [employer].

## 2023-03-03 NOTE — Assessment & Plan Note (Signed)
Stable on Ozempic 1 mg weekly

## 2023-03-03 NOTE — Assessment & Plan Note (Signed)
Doing well on Paxlovid Isolation precaution discussed She is pretty much out of the window However, since she still coughs here and there, I recommend, that she continue to wear facemask to prevent disease spread She agreed with the plan

## 2023-03-03 NOTE — Assessment & Plan Note (Signed)
Amputation due to osteomyelitis of her left 5th toe s/p spider bit. No skin breakdown Stump looks good

## 2023-03-03 NOTE — Progress Notes (Signed)
    SUBJECTIVE:   CHIEF COMPLAINT / HPI:   COVID-19 infection: She developed COVID-19 symptoms on August 28th and tested positive on September 2nd. She started Paxlovid and has three more doses to complete. She denies SOB, and her cough has improved a lot. She returns back to work next Monday, 9/9.  Hypercalcemia: No concern. Here for repeat lab.  DM2: She is compliant with Ozempic 1 mg weekly. No concerns.  HM: Need flu shot, Shingrix, and COVID-19 shots  Hx of toe amputation: no concern   PERTINENT  PMH / PSH: PMHx reviewed  OBJECTIVE:   BP 133/83   Pulse 71   Ht 5\' 5"  (1.651 m)   Wt 192 lb 6.4 oz (87.3 kg)   SpO2 99%   BMI 32.02 kg/m   Physical Exam Vitals and nursing note reviewed.  Cardiovascular:     Rate and Rhythm: Normal rate and regular rhythm.     Heart sounds: Normal heart sounds. No murmur heard. Pulmonary:     Effort: Pulmonary effort is normal. No respiratory distress.     Breath sounds: Normal breath sounds. No wheezing.  Musculoskeletal:     Comments: Sensory exam of the foot is normal, tested with the monofilament. Good pulses, no lesions or ulcers, good peripheral pulses. Left 5th toe s/p amputation with no ulceration.  Neurological:     Mental Status: She is alert.      ASSESSMENT/PLAN:   COVID-19 Doing well on Paxlovid Isolation precaution discussed She is pretty much out of the window However, since she still coughs here and there, I recommend, that she continue to wear facemask to prevent disease spread She agreed with the plan  Hypercalcemia Bmet repeated I will contact her soon with her test results  Type 2 diabetes mellitus with other specified complication (HCC) Stable on Ozempic 1 mg weekly  History of amputation of lesser toe, left (HCC) Amputation due to osteomyelitis of her left 5th toe s/p spider bit. No skin breakdown Stump looks good   HM: Hold flu/Covid vaccination till she is completely recovered from her  COVID-19 infection F/U in 4 weeks for flu shot  [name], MD [facility]

## 2023-03-04 LAB — BASIC METABOLIC PANEL
BUN/Creatinine Ratio: 29 — ABNORMAL HIGH (ref 12–28)
BUN: 35 mg/dL — ABNORMAL HIGH (ref 8–27)
CO2: 23 mmol/L (ref 20–29)
Calcium: 10.5 mg/dL — ABNORMAL HIGH (ref 8.7–10.3)
Chloride: 99 mmol/L (ref 96–106)
Creatinine, Ser: 1.2 mg/dL — ABNORMAL HIGH (ref 0.57–1.00)
Glucose: 85 mg/dL (ref 70–99)
Potassium: 4.9 mmol/L (ref 3.5–5.2)
Sodium: 139 mmol/L (ref 134–144)
eGFR: 52 mL/min/{1.73_m2} — ABNORMAL LOW (ref >59)

## 2023-03-06 ENCOUNTER — Telehealth: Payer: Self-pay | Admitting: Family Medicine

## 2023-03-06 NOTE — Telephone Encounter (Signed)
Result discussed with the patient. Her Calcium level improved a bit, but Kidney function worsened despite good hydration.  I suspect her hypercalcemia is related to her Chlorthalidone. She is also on Aldactone which in combination with Chlorthalidone may impair her renal function.  Plan is to hold her Chlorthalidone for 4 weeks and Take Aldactone (Spironolactone) 50 mg BID instead of daily.  She will continue to monitor her BP closely at home and return to see me in 4 weeks. Appointment made.  Dr. Lum Babe

## 2023-03-10 ENCOUNTER — Encounter: Payer: Self-pay | Admitting: Family Medicine

## 2023-03-13 NOTE — Progress Notes (Deleted)
    SUBJECTIVE:   CHIEF COMPLAINT / HPI:   Calf cramps  PERTINENT  PMH / PSH: ***  OBJECTIVE:   There were no vitals taken for this visit. ***  General: NAD, pleasant, able to participate in exam Cardiac: RRR, no murmurs. Respiratory: CTAB, normal effort, No wheezes, rales or rhonchi Abdomen: Bowel sounds present, nontender, nondistended Extremities: no edema or cyanosis. Skin: warm and dry, no rashes noted Neuro: alert, no obvious focal deficits Psych: Normal affect and mood  ASSESSMENT/PLAN:   No problem-specific Assessment & Plan notes found for this encounter.     Dr. Elberta Fortis, DO [facility]    {    This will disappear when note is signed, click to select method of visit    :1}

## 2023-03-14 ENCOUNTER — Ambulatory Visit: Payer: Medicare HMO

## 2023-03-24 ENCOUNTER — Encounter: Payer: Self-pay | Admitting: Family Medicine

## 2023-03-24 ENCOUNTER — Telehealth (INDEPENDENT_AMBULATORY_CARE_PROVIDER_SITE_OTHER): Payer: Medicare HMO | Admitting: Family Medicine

## 2023-03-24 VITALS — BP 154/82

## 2023-03-24 DIAGNOSIS — M79604 Pain in right leg: Secondary | ICD-10-CM

## 2023-03-24 DIAGNOSIS — I10 Essential (primary) hypertension: Secondary | ICD-10-CM

## 2023-03-24 DIAGNOSIS — M79605 Pain in left leg: Secondary | ICD-10-CM

## 2023-03-24 DIAGNOSIS — M79606 Pain in leg, unspecified: Secondary | ICD-10-CM | POA: Insufficient documentation

## 2023-03-24 NOTE — Assessment & Plan Note (Signed)
Worse on the left after a fall Symptom is rapidly improving May continue her home pain regimen  Return precautions discussed I'll reassess at her next visit to see me

## 2023-03-24 NOTE — Patient Instructions (Signed)
Ankle Sprain  An ankle sprain is a stretch or tear in a ligament in your ankle. Ligaments are tissues that connect bones to each other.  An ankle sprain can happen when: The ankle rolls outward. This is called an inversion sprain. The ankle rolls inward. This is called an eversion sprain. What are the causes? An ankle sprain is caused by rolling or twisting your ankle. What increases the risk? You are more likely to get an ankle sprain if you play sports. What are the signs or symptoms?  Pain in your ankle. Swelling. Bruising. Bruises may form right after you sprain your ankle or 1-2 days later. Trouble standing or walking. How is this treated? An ankle sprain may be treated with: A brace or splint. This is used to keep the ankle from moving until it heals. An elastic bandage (dressing). This is used to support the ankle. Crutches. Pain medicine. Surgery. This may be needed if the sprain is very bad. Physical therapy. This can help you move your ankle better. Follow these instructions at home: If you have a brace or a splint that can be taken off: Wear the brace or splint as told by your doctor. Take it off only as told by your doctor. Check the skin around the brace or splint every day. Tell your doctor if you see problems. Loosen the brace or splint if your toes: Tingle. Become numb. Turn cold and blue. Keep the brace or splint clean and dry. If the brace or splint is not waterproof: Do not let it get wet. Cover it with a watertight covering when you take a bath or a shower. If you have an elastic dressing: Take it off to shower or bathe. Adjust it if it feels too tight. Loosen the dressing if your foot: Tingles. Becomes numb. Turns cold and blue. Managing pain, stiffness, and swelling If told, put ice on the affected area. If you have a removable brace or splint, take it off as told by your doctor. Put ice in a plastic bag. Place a towel between your skin and the  bag. Leave the ice on for 20 minutes, 2-3 times a day. If your skin turns bright red, take off the ice right away to prevent skin damage. The risk of damage is higher if you cannot feel pain, heat, or cold. Move your toes often. Raise your ankle above the level of your heart while you are sitting or lying down. General instructions Take over-the-counter and prescription medicines only as told by your doctor. Do not smoke or use any products that contain nicotine or tobacco. If you need help quitting, ask your doctor. Rest your ankle. Use crutches to support your body weight. Do not use your injured leg to support your body weight until your doctor says that you can. Ask your doctor when it is safe to drive if you have a brace or splint on your ankle. Contact a doctor if: Your bruises or swelling get worse all of a sudden. Your pain does not get better after you take medicine. Get help right away if: Your foot or toes are numb or blue. You have very bad pain that gets worse. This information is not intended to replace advice given to you by your health care provider. Make sure you discuss any questions you have with your health care provider. Document Revised: 03/16/2022 Document Reviewed: 03/16/2022 Elsevier Patient Education  2024 [employer].

## 2023-03-24 NOTE — Progress Notes (Signed)
[facility] Telemedicine Visit  Patient consented to have virtual visit and was identified by name and date of birth. Method of visit: Video  Encounter participants: Patient: Teresa [name] - located at Home Provider: [name] - located at [facility] office Others (if applicable): N/A  Chief Complaint: BP follow up and fall  HPI:  HTN:  She is compliant with her Hydralazine 100 mg TID and taking Aldactone 50 mg QD instead of BID. She is currently off her Chlorthalidone. Has not been checking her BP till today.  Fall: She fell off the stairs on 03/07/23 at her daughter's house. This was just 3 flights of stairs. She stated she missed her steps, fell, and rolled down the steps. She twisted her left ankle and woke up the next day with left ankle and left calf swelling and cramping. She also endorsed milder right calf cramping. Her pain was about 4/10 in severity but improved with her pain medicine and muscle relaxant. She endorsed significant pain improvement to 1/10 in severity today with no calf swelling or redness. She is able to bear weight better on her legs.   ROS: per HPI  Pertinent PMHx: PMhx reviewed  Exam:  BP (!) 154/82   Physical Exam Vitals and nursing note reviewed.  Constitutional:      General: She is not in acute distress.    Appearance: Normal appearance.  Pulmonary:     Effort: Pulmonary effort is normal. No respiratory distress.  Neurological:     Mental Status: She is oriented to person, place, and time.      Assessment/Plan:  Hypertension She self checked her BP multiple times during this visit (see flow sheet) BP elevated while off her Chlorthalidone as expected I reminded her that her Aldactone is now 50 mg BID since we took her off her Chlorthalidone She will check BP daily and f/u as scheduled for reassessment and blood work Appointment scheduled  Leg pain Worse on the left after a fall Symptom is rapidly improving May  continue her home pain regimen  Return precautions discussed I'll reassess at her next visit to see me    Time spent during visit with patient: 30 minutes

## 2023-03-24 NOTE — Assessment & Plan Note (Addendum)
She self checked her BP multiple times during this visit (see flow sheet) BP elevated while off her Chlorthalidone as expected I reminded her that her Aldactone is now 50 mg BID since we took her off her Chlorthalidone She will check BP daily and f/u as scheduled for reassessment and blood work Appointment scheduled

## 2023-04-14 ENCOUNTER — Encounter: Payer: Medicare HMO | Admitting: Family Medicine

## 2023-04-21 ENCOUNTER — Ambulatory Visit (INDEPENDENT_AMBULATORY_CARE_PROVIDER_SITE_OTHER): Payer: Medicare HMO | Admitting: Family Medicine

## 2023-04-21 ENCOUNTER — Encounter: Payer: Self-pay | Admitting: Family Medicine

## 2023-04-21 VITALS — BP 137/78 | HR 73 | Ht 65.0 in | Wt 198.2 lb

## 2023-04-21 DIAGNOSIS — I10 Essential (primary) hypertension: Secondary | ICD-10-CM | POA: Diagnosis not present

## 2023-04-21 DIAGNOSIS — I739 Peripheral vascular disease, unspecified: Secondary | ICD-10-CM

## 2023-04-21 DIAGNOSIS — Z0000 Encounter for general adult medical examination without abnormal findings: Secondary | ICD-10-CM | POA: Diagnosis not present

## 2023-04-21 DIAGNOSIS — Z23 Encounter for immunization: Secondary | ICD-10-CM

## 2023-04-21 DIAGNOSIS — Z6832 Body mass index (BMI) 32.0-32.9, adult: Secondary | ICD-10-CM | POA: Diagnosis not present

## 2023-04-21 DIAGNOSIS — E1169 Type 2 diabetes mellitus with other specified complication: Secondary | ICD-10-CM | POA: Diagnosis not present

## 2023-04-21 DIAGNOSIS — I5032 Chronic diastolic (congestive) heart failure: Secondary | ICD-10-CM | POA: Diagnosis not present

## 2023-04-21 MED ORDER — OZEMPIC (2 MG/DOSE) 8 MG/3ML ~~LOC~~ SOPN: 2.0000 mg | PEN_INJECTOR | SUBCUTANEOUS | 1 refills | Status: DC

## 2023-04-21 MED ORDER — SPIRONOLACTONE 100 MG PO TABS: 100.0000 mg | ORAL_TABLET | Freq: Every day | ORAL | 1 refills | Status: DC

## 2023-04-21 NOTE — Patient Instructions (Signed)
How to Increase Your Level of Physical Activity Getting regular physical activity is important for your overall health and well-being. Most people do not get enough exercise. There are easy ways to increase your level of physical activity, even if you have not been very active in the past or if you are just starting out. What are the benefits of physical activity? Physical activity has many short-term and long-term benefits. Being active on a regular basis can improve your physical and mental health as well as provide other benefits. Physical health benefits Helping you lose weight or maintain a healthy weight. Strengthening your muscles and bones. Reducing your risk of certain long-term (chronic) diseases, including heart disease, cancer, and diabetes. Being able to move around more easily and for longer periods of time without getting tired (increased endurance or stamina). Improving your ability to fight off illness (enhanced immunity). Being able to sleep better. Helping you stay healthy as you get older, including: Helping you stay mobile, or capable of walking and moving around. Preventing accidents, such as falls. Increasing life expectancy. Mental health benefits Boosting your mood and improving your self-esteem. Lowering your chance of having mental health problems, such as depression or anxiety. Helping you feel good about your body. Other benefits Finding new sources of fun and enjoyment. Meeting new people who share a common interest. Before you begin If you have a chronic illness or have not been active for a while, check with your health care provider about how to get started. Ask your health care provider what activities are safe for you. Start out slowly. Walking or doing some simple chair exercises is a good place to start, especially if you have not been active before or for a long time. Set goals that you can work toward. Ask your health care provider how much exercise is  best for you. In general, most adults should: Do moderate-intensity exercise for at least 150 minutes each week (30 minutes on most days of the week) or vigorous exercise for at least 75 minutes each week, or a combination of these. Moderate-intensity exercise can include walking at a quick pace, biking, yoga, water aerobics, or gardening. Vigorous exercise involves activities that take more effort, such as jogging or running, playing sports, swimming laps, or jumping rope. Do strength exercises on at least 2 days each week. This can include weight lifting, body weight exercises, and resistance-band exercises. How to be more physically active Make a plan  Try to find activities that you enjoy. You are more likely to commit to an exercise routine if it does not feel like a chore. If you have bone or joint problems, choose low-impact exercises, like walking or swimming. Use these tips for being successful with an exercise plan: Find a workout partner for accountability. Join a group or class, such as an aerobics class, cycling class, or sports team. Make family time active. Go for a walk, bike, or swim. Include a variety of exercises each week. Consider using a fitness tracker, such as a mobile phone app or a device worn like a watch, that will count the number of steps you take each day. Many people strive to reach 10,000 steps a day. Find ways to be active in your daily routines Besides your formal exercise plans, you can find ways to do physical activity during your daily routines, such as: Walking or biking to work or to the store. Taking the stairs instead of the elevator. Parking farther away from the door at work  or at the store. Planning walking meetings. Walking around while you are on the phone. Where to find more information Centers for Disease Control and Prevention: [employer].pt [employer] on Fitness, Sports & Nutrition: www.fitness.gov ChooseMyPlate:  http://www.harvey.com/ Contact a health care provider if: You have headaches, muscle aches, or joint pain that is concerning. You feel dizzy or light-headed while exercising. You faint. You feel your heart skipping, racing, or fluttering. You have chest pain while exercising. Summary Exercise benefits your mind and body at any age, even if you are just starting out. If you have a chronic illness or have not been active for a while, check with your health care provider before increasing your physical activity. Choose activities that are safe and enjoyable for you. Ask your health care provider what activities are safe for you. Start slowly. Tell your health care provider if you have problems as you start to increase your activity level. This information is not intended to replace advice given to you by your health care provider. Make sure you discuss any questions you have with your health care provider. Document Revised: 10/09/2020 Document Reviewed: 10/09/2020 Elsevier Patient Education  2024 [employer].

## 2023-04-21 NOTE — Assessment & Plan Note (Signed)
Continue ASA and HLD management

## 2023-04-21 NOTE — Assessment & Plan Note (Signed)
Diet and exercise counseling provided Increase Ozempic to 2 mg weekly Monitor BMI closely

## 2023-04-21 NOTE — Assessment & Plan Note (Signed)
Stable

## 2023-04-21 NOTE — Assessment & Plan Note (Signed)
D/C Chlorthalidone Increase Aldactone to 100 mg every day Continue Hydralazine as it is Monitor BP closely

## 2023-04-21 NOTE — Assessment & Plan Note (Signed)
Doing well Increase Ozempic to 2 mg weekly to also help with weight management F/U in 2 months for A1C check She agreed with the plan

## 2023-04-21 NOTE — Progress Notes (Signed)
Subjective:   Teresa [name] is a 62 y.o. female who presents for Medicare Annual (Subsequent) preventive examination.  Visit Complete: In person  Patient Medicare AWV questionnaire was completed by the patient on 04/21/23; I have confirmed that all information answered by patient is correct and no changes since this date.   HTN: She is compliant with Coreg 25 mg bid, hydralazine 100 mg TID and Aldactone 50 mg one tab BID. She d/ced Chlorthalidone as instructed. Home BP reports are fine. No new concerns.  DM/BMI: She does not get much exercise. She eats a healthy meal. Would want to lose more weight. She is compliant with Ozempic 1 mg weekly.  Other chronic problems are stable. No SOB, chest pain, or leg swelling.     Objective:    Today's Vitals   04/21/23 0923 04/21/23 0939  BP: 129/75 137/78  Pulse: 73   SpO2: 100%   Weight: 198 lb 3.2 oz (89.9 kg)   Height: 5\' 5"  (1.651 m)   PainSc: 0-No pain 2    Body mass index is 32.98 kg/m.     04/21/2023    9:28 AM 03/24/2023    8:31 AM 03/03/2023    8:54 AM 01/17/2023   10:10 AM 08/16/2022    6:44 AM 07/19/2022    9:05 AM 07/08/2022    8:54 AM  Advanced Directives  Does Patient Have a Medical Advance Directive? No No No No No No No  Would patient like information on creating a medical advance directive? No - Patient declined No - Patient declined No - Patient declined No - Patient declined No - Patient declined No - Patient declined No - Patient declined    Current Medications (verified) Outpatient Encounter Medications as of 04/21/2023  Medication Sig   aspirin 81 MG chewable tablet Chew 1 tablet (81 mg total) by mouth daily.   carvedilol (COREG) 25 MG tablet TAKE 1 TABLET BY MOUTH TWICE DAILY WITH MEALS   CEQUA 0.09 % SOLN Apply 1 drop to eye 2 (two) times daily.   hydrALAZINE (APRESOLINE) 100 MG tablet Take 1 tablet (100 mg total) by mouth 3 (three) times daily.   inclisiran (LEQVIO) 284 MG/1.5ML SOSY injection Inject 284  mg into the skin once.   levothyroxine (SYNTHROID) 175 MCG tablet TAKE 1 TABLET BY MOUTH ONCE DAILY IN THE MORNING 30  MINUTES  BEFORE  FOOD   Semaglutide, 2 MG/DOSE, (OZEMPIC, 2 MG/DOSE,) 8 MG/3ML SOPN Inject 2 mg into the skin once a week.   [DISCONTINUED] Semaglutide, 1 MG/DOSE, (OZEMPIC, 1 MG/DOSE,) 4 MG/3ML SOPN INJECT 1 MG SUBCUTANEOUSLY ONCE A WEEK   [DISCONTINUED] spironolactone (ALDACTONE) 50 MG tablet Take 1 tablet by mouth once daily   acetaminophen-codeine (TYLENOL #3) 300-30 MG tablet Take 1 tablet by mouth 2 (two) times daily as needed for moderate pain. (Patient not taking: Reported on 03/03/2023)   baclofen (LIORESAL) 10 MG tablet Take 1 tablet (10 mg total) by mouth at bedtime as needed for muscle spasms. (Patient not taking: Reported on 04/21/2023)   Carboxymeth-Glyc-Polysorb PF (REFRESH OPTIVE MEGA-3) 0.5-1-0.5 % SOLN Place 1 drop into both eyes 2 (two) times daily. (Patient not taking: Reported on 04/21/2023)   cetirizine (ZYRTEC ALLERGY) 10 MG tablet Take 1 tablet (10 mg total) by mouth daily. (Patient not taking: Reported on 04/21/2023)   diclofenac Sodium (VOLTAREN) 1 % GEL Apply 2 g topically 4 (four) times daily. Apply to shoulder (Patient not taking: Reported on 03/03/2023)   spironolactone (ALDACTONE) 100 MG tablet  Take 1 tablet (100 mg total) by mouth daily.   [DISCONTINUED] chlorthalidone (HYGROTON) 25 MG tablet Take 1 tablet by mouth once daily (Patient not taking: Reported on 03/24/2023)   No facility-administered encounter medications on file as of 04/21/2023.    Allergies (verified) Amlodipine, Bactrim [sulfamethoxazole-trimethoprim], Clonidine derivatives, Lisinopril, Statins, Aspirin, and Latex   History: Past Medical History:  Diagnosis Date   Allergy    Anemia    Cervical disc disorder with radiculopathy of cervical region 02/10/2016   Chronic lower back pain    Claudication in peripheral vascular disease (HCC) 06/12/2020   Diverticulosis    Ear discomfort  03/11/2016   EKG, abnormal 01/06/2017   Gastritis    GERD (gastroesophageal reflux disease)    history   Graves' disease    2005   H/O hiatal hernia    Hiatal hernia    History of amputation of lesser toe of left foot (HCC) 03/11/2015   History of colon cancer    Hypertension    Hyperthyroidism    hx 2005   Hypothyroidism    Lateral knee pain, left 08/06/2013   Left shoulder pain 05/28/2015   Migraine headache    "a few times/yr" (03/05/2015)   Neck pain 02/11/2014   Neck strain 02/06/2012   Numbness and tingling in right hand 05/16/2018   Osteomyelitis (HCC)    Hattie Perch 03/05/2015   Osteomyelitis of toe of left foot (HCC) 03/05/2015   Palpitations 01/05/2016   Positive TB test    1980's had a reaction to TB test   Sleep apnea    Snoring 02/15/2021   Stroke (HCC) 08/2017   Throat pain 09/21/2011   Normal CT and US neck march 2013, repeat Neck US in 1 year to follow-up small nodules.  Head CT 2013: normal ? glosspharyngeal neuralgia.  On amitriptyline await [facility] ENt referral.    Tubular adenoma of colon    Tubular adenoma of colon    Ulnar neuropathy at elbow of right upper extremity 06/12/2018   Past Surgical History:  Procedure Laterality Date   ABDOMINAL HYSTERECTOMY  2006   AMPUTATION TOE Left 03/05/2015   Procedure: AMPUTATION LEFT 5TH  TOE;  Surgeon: Toni Arthurs, MD;  Location: [facility] OR;  Service: Orthopedics;  Laterality: Left;   APPENDECTOMY  2006   BREAST BIOPSY Left 11/26/2014   CESAREAN SECTION  1979 1986 1990   CHOLECYSTECTOMY     COLONOSCOPY     GALLBLADDER SURGERY     LOOP RECORDER INSERTION N/A 09/18/2017   Procedure: LOOP RECORDER INSERTION;  Surgeon: Marinus Maw, MD;  Location: [facility];  Service: Cardiovascular;  Laterality: N/A;   OPEN REDUCTION INTERNAL FIXATION (ORIF) DISTAL RADIAL FRACTURE Right 09/14/2019   Procedure: OPEN REDUCTION INTERNAL FIXATION (ORIF)  RADIAL AND ULNA FRACTURE;  Surgeon: Bradly Bienenstock, MD;  Location: [facility] OR;  Service:  Orthopedics;  Laterality: Right;   ORIF ULNAR FRACTURE Right 09/14/2019   RADIOLOGY WITH ANESTHESIA Left 08/16/2022   Procedure: MRI WITH LEFT SHOULDER WITHOUT CONTRAST,CERVICAL SPINE WITHOUT;  Surgeon: Radiologist, Medication, MD;  Location: [facility] OR;  Service: Radiology;  Laterality: Left;   TEE WITHOUT CARDIOVERSION N/A 09/18/2017   Procedure: TRANSESOPHAGEAL ECHOCARDIOGRAM (TEE);  Surgeon: Chrystie Nose, MD;  Location: [facility];  Service: Cardiovascular;  Laterality: N/A;   TOE AMPUTATION Left 03/05/2015   5th toe   TUBAL LIGATION Bilateral 1990   UPPER GASTROINTESTINAL ENDOSCOPY     Family History  Problem Relation Age of Onset   Heart  disease Mother        CAB age 73   Diabetes Mother    Cancer Father 34       pancreatic cancer   Stroke Father    Pancreatic cancer Sister    Diabetes Sister    Kidney disease Sister        renal failure   Diabetes Brother    Kidney disease Brother        renal failure   Breast cancer Maternal Aunt        per pt aunts and uncles died of cancer not sure the type   Breast cancer Paternal Aunt    Cancer Paternal Uncle    Cancer Sister    Esophageal cancer Neg Hx    Rectal cancer Neg Hx    Stomach cancer Neg Hx    Thyroid disease Neg Hx    Colon cancer Neg Hx    Colon polyps Neg Hx    Social History   Socioeconomic History   Marital status: Single    Spouse name: Not on file   Number of children: 3   Years of education: Not on file   Highest education level: Associate degree: occupational, Scientist, product/process development, or vocational program  Occupational History   Occupation: cosmetolgist  Tobacco Use   Smoking status: Former    Current packs/day: 0.00    Average packs/day: 1 pack/day for 36.0 years (36.0 ttl pk-yrs)    Types: Cigarettes    Start date: 09/23/1981    Quit date: 09/23/2017    Years since quitting: 5.5   Smokeless tobacco: Never   Tobacco comments:    Tobacco info given 03/01/17  Vaping Use   Vaping status: Never Used  Substance  and Sexual Activity   Alcohol use: Never   Drug use: Never   Sexual activity: Not Currently    Birth control/protection: Post-menopausal, Surgical  Other Topics Concern   Not on file  Social History Narrative   Lives with two adult children.  Another daughter lives in [place].  Cosmetologist.  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  Likes to fish and play bingo.  Goes to church.   Social Determinants of Health   Financial Resource Strain: Low Risk  (04/07/2023)   Overall Financial Resource Strain (CARDIA)    Difficulty of Paying Living Expenses: Not hard at all  Food Insecurity: No Food Insecurity (04/21/2023)   Hunger Vital Sign    Worried About Running Out of Food in the Last Year: Never true    Ran Out of Food in the Last Year: Never true  Transportation Needs: No Transportation Needs (04/07/2023)   PRAPARE - Administrator, Civil Service (Medical): No    Lack of Transportation (Non-Medical): No  Physical Activity: Insufficiently Active (04/21/2023)   Exercise Vital Sign    Days of Exercise per Week: 1 day    Minutes of Exercise per Session: 120 min  Stress: No Stress Concern Present (04/07/2023)   [employer] of Occupational Health - Occupational Stress Questionnaire    Feeling of Stress : Not at all  Social Connections: Moderately Integrated (04/07/2023)   Social Connection and Isolation Panel [NHANES]    Frequency of Communication with Friends and Family: More than three times a week    Frequency of Social Gatherings with Friends and Family: Twice a week    Attends Religious Services: More than 4 times per year    Active Member of [employer] or Organizations: Yes    Attends Club or  Organization Meetings: More than 4 times per year    Marital Status: Divorced    Tobacco Counseling: Quit smoking, obtain yearly lung cancer screen Counseling given: Yes Tobacco comments: Tobacco info given 03/01/17   Clinical Intake:  Pre-visit preparation completed: Yes  Pain Score: 2   (Left ankle pain to knee area. Feels like aching. This is chronic)     BMI - recorded: 32.98 Nutritional Status: BMI > 30  Obese Diabetes: Yes CBG done?: No Did pt. bring in CBG monitor from home?: No  How often do you need to have someone help you when you read instructions, pamphlets, or other written materials from your doctor or pharmacy?: 1 - Never  Interpreter Needed?: No      Activities of Daily Living    04/21/2023    9:54 AM 08/16/2022    6:47 AM  In your present state of health, do you have any difficulty performing the following activities:  Hearing? 0 0  Vision? 0 0  Comment Wears eyeglasses   Difficulty concentrating or making decisions? 0 0  Walking or climbing stairs? 0 0  Dressing or bathing? 0 0  Doing errands, shopping? 0     Patient Care Team: Doreene Eland, MD as PCP - General (Family Medicine) Lennette Bihari, MD as PCP - Sleep Medicine (Cardiology) Linna Darner, RD as Dietitian (Family Medicine) Chilton Si, MD as Consulting Physician (Cardiology)  Indicate any recent Medical Services you may have received from other than [facility] providers in the past year (date may be approximate).     Assessment:   This is a routine wellness examination for [place].  Hearing/Vision screen Hearing Screening   500Hz  1000Hz  2000Hz  4000Hz   Right ear Pass Pass Pass Pass  Left ear Pass Pass Pass Pass   Vision Screening   Right eye Left eye Both eyes  Without correction     With correction 20/40 20/20 20/20      Goals Addressed             This Visit's Progress    Weight (lb) < 180 lb (81.6 kg)   198 lb 3.2 oz (89.9 kg)    Start 04/21/23 End 04/20/24  Patient will like to reduce the number of medications she is on.         Depression Screen    04/21/2023    9:48 AM 03/24/2023    8:31 AM 03/03/2023    8:54 AM 01/17/2023   10:08 AM 07/19/2022    9:04 AM 07/08/2022    8:56 AM 06/17/2022    9:51 AM  PHQ 2/9 Scores  PHQ - 2 Score 0 0 0 0  0 0 0  PHQ- 9 Score 0 0 0 1 1 0     Fall Risk    04/21/2023    9:53 AM 03/24/2023    8:31 AM 06/17/2022    9:50 AM 05/02/2022    1:50 PM 09/24/2019   10:01 AM  Fall Risk   Falls in the past year? 1 1 0 0 0  Number falls in past yr: 0 0 0  0  Injury with Fall? 1 0 0    Follow up Falls evaluation completed;Falls prevention discussed    Falls evaluation completed    MEDICARE RISK AT HOME:  No fall risk  TIMED UP AND GO:  Was the test performed?  Yes  Length of time to ambulate 10 feet: 9 sec Gait steady and fast without use of assistive device  Cognitive Function:  Mini-Cog - 04/21/23 0959     Normal clock drawing test? yes    How many words correct? 3                Immunizations Immunization History  Administered Date(s) Administered   Influenza Split 07/17/2012   Influenza, Seasonal, Injecte, Preservative Fre 04/21/2023   Influenza,inj,Quad PF,6+ Mos 05/30/2013, 04/15/2014, 05/28/2015, 04/28/2016, 06/28/2017, 04/20/2018, 08/20/2019, 05/03/2021, 04/05/2022   PFIZER Comirnaty(Gray Top)Covid-19 Tri-Sucrose Vaccine 09/25/2020   PFIZER(Purple Top)SARS-COV-2 Vaccination 10/26/2019, 11/23/2019   PNEUMOCOCCAL CONJUGATE-20 05/11/2021   Td 05/27/1998   Tdap 07/17/2012, 07/19/2022    TDAP status: Up to date  Flu Vaccine status: Up to date  Pneumococcal vaccine status: Up to date  Covid-19 vaccine status: Declined, Education has been provided regarding the importance of this vaccine but patient still declined. Advised may receive this vaccine at local pharmacy or Health Dept.or vaccine clinic. Aware to provide a copy of the vaccination record if obtained from local pharmacy or Health Dept. Verbalized acceptance and understanding.  Qualifies for Shingles Vaccine? No   Zostavax completed No   Shingrix Completed?: No.    Education has been provided regarding the importance of this vaccine. Patient has been advised to call insurance company to determine out of pocket  expense if they have not yet received this vaccine. Advised may also receive vaccine at local pharmacy or Health Dept. Verbalized acceptance and understanding.  Screening Tests Health Maintenance  Topic Date Due   OPHTHALMOLOGY EXAM  Never done   Zoster Vaccines- Shingrix (1 of 2) Never done   Medicare Annual Wellness (AWV)  05/04/2023   COVID-19 Vaccine (4 - 2023-24 season) 05/07/2023 (Originally 02/26/2023)   Lung Cancer Screening  06/07/2023   Diabetic kidney evaluation - Urine ACR  07/20/2023   HEMOGLOBIN A1C  07/20/2023   Diabetic kidney evaluation - eGFR measurement  03/02/2024   FOOT EXAM  03/02/2024   MAMMOGRAM  11/28/2024   Colonoscopy  06/04/2027   DTaP/Tdap/Td (4 - Td or Tdap) 07/19/2032   INFLUENZA VACCINE  Completed   Hepatitis C Screening  Completed   HIV Screening  Completed   HPV VACCINES  Aged Out    Health Maintenance  Health Maintenance Due  Topic Date Due   OPHTHALMOLOGY EXAM  Never done   Zoster Vaccines- Shingrix (1 of 2) Never done   Medicare Annual Wellness (AWV)  05/04/2023    Colorectal cancer screening: Type of screening: Colonoscopy. Completed 06/03/22. Repeat every 5 years  Mammogram status: Completed 11/2022 need a repeat in 6 months. Repeat every year  Dexa Scan: Does not qualify  Lung Cancer Screening: (Low Dose CT Chest recommended if Age 50-80 years, 20 pack-year currently smoking OR have quit w/in 15years.) does qualify.   Lung Cancer Screening Referral: Will refer in Dec 2024  Additional Screening:  Hepatitis C Screening: does qualify; Completed 2017 and was negative  Vision Screening: Recommended annual ophthalmology exams for early detection of glaucoma and other disorders of the eye. Is the patient up to date with their annual eye exam?  Not recent - need referral Who is the provider or what is the name of the office in which the patient attends annual eye exams? Dr. Valora Piccolo - moved, need to find another ophthalmology If pt is not  established with a provider, would they like to be referred to a provider to establish care? Yes .   Dental Screening: Recommended annual dental exams for proper oral hygiene  Diabetic Foot Exam: Diabetic  Foot Exam: Completed 02/2023  Community Resource Referral / Chronic Care Management: CRR required this visit?  No   CCM required this visit?  No     Plan:     I have personally reviewed and noted the following in the patient's chart:   Medical and social history Use of alcohol, tobacco or illicit drugs  Current medications and supplements including opioid prescriptions. Patient is not currently taking opioid prescriptions. Functional ability and status Nutritional status Physical activity Advanced directives List of other physicians Hospitalizations, surgeries, and ER visits in previous 12 months Vitals Screenings to include cognitive, depression, and falls Referrals and appointments  In addition, I have reviewed and discussed with patient certain preventive protocols, quality metrics, and best practice recommendations. A written personalized care plan for preventive services as well as general preventive health recommendations were provided to patient.   Type 2 diabetes mellitus with other specified complication (HCC) Doing well Increase Ozempic to 2 mg weekly to also help with weight management F/U in 2 months for A1C check She agreed with the plan  BMI 32.0-32.9,adult Diet and exercise counseling provided Increase Ozempic to 2 mg weekly Monitor BMI closely  Peripheral vascular disease (HCC) Continue ASA and HLD management  Chronic diastolic CHF (congestive heart failure) (HCC) Stable  Hypertension D/C Chlorthalidone Increase Aldactone to 100 mg every day Continue Hydralazine as it is Monitor BP closely   [name], MD   04/21/2023   After Visit Summary: (In Person-Printed) AVS printed and given to the patient

## 2023-04-22 ENCOUNTER — Other Ambulatory Visit: Payer: Self-pay | Admitting: Family Medicine

## 2023-04-22 LAB — BASIC METABOLIC PANEL
BUN/Creatinine Ratio: 13 (ref 12–28)
BUN: 14 mg/dL (ref 8–27)
CO2: 26 mmol/L (ref 20–29)
Calcium: 10.3 mg/dL (ref 8.7–10.3)
Chloride: 102 mmol/L (ref 96–106)
Creatinine, Ser: 1.06 mg/dL — ABNORMAL HIGH (ref 0.57–1.00)
Glucose: 66 mg/dL — ABNORMAL LOW (ref 70–99)
Potassium: 4.4 mmol/L (ref 3.5–5.2)
Sodium: 141 mmol/L (ref 134–144)
eGFR: 60 mL/min/{1.73_m2} (ref >59)

## 2023-04-22 MED ORDER — BLOOD GLUCOSE TEST VI STRP: 1.0000 | ORAL_STRIP | Freq: Two times a day (BID) | 1 refills | Status: DC

## 2023-04-22 MED ORDER — LANCET DEVICE MISC: 1.0000 | Freq: Two times a day (BID) | 1 refills | Status: AC

## 2023-04-22 MED ORDER — LANCETS MISC. MISC: 1.0000 | Freq: Two times a day (BID) | 1 refills | Status: AC

## 2023-04-22 MED ORDER — BLOOD GLUCOSE MONITOR KIT: PACK | 0 refills | Status: DC

## 2023-04-22 MED ORDER — BLOOD GLUCOSE MONITORING SUPPL DEVI: 1.0000 | Freq: Three times a day (TID) | 0 refills | Status: AC

## 2023-04-22 NOTE — Addendum Note (Signed)
Addended by: [name] T on: 04/22/2023 10:00 AM   Modules accepted: Orders

## 2023-04-28 ENCOUNTER — Ambulatory Visit: Payer: Medicare HMO | Admitting: Family Medicine

## 2023-05-01 ENCOUNTER — Telehealth: Payer: Self-pay | Admitting: Pharmacy Technician

## 2023-05-01 NOTE — Telephone Encounter (Signed)
Auth Submission: APPROVED Site of care: Site of care: [facility] Payer: [employer] MEDICARE Medication & CPT/J Code(s) submitted: Leqvio (Inclisiran) [id number] Route of submission (phone, fax, portal):  Phone # Fax # Auth type: Buy/Bill PB Units/visits requested: 284MG  [id number] Reference number: [id number] Approval from: 06/27/22 to 06/26/24

## 2023-05-03 DIAGNOSIS — E1169 Type 2 diabetes mellitus with other specified complication: Secondary | ICD-10-CM

## 2023-06-06 ENCOUNTER — Other Ambulatory Visit: Payer: Self-pay | Admitting: Family Medicine

## 2023-06-06 ENCOUNTER — Ambulatory Visit
Admission: RE | Admit: 2023-06-06 | Discharge: 2023-06-06 | Disposition: A | Discharge status: Home / Self Care | Payer: Medicare HMO | Source: Ambulatory Visit | Attending: Family Medicine | Admitting: Family Medicine

## 2023-06-06 DIAGNOSIS — N6321 Unspecified lump in the left breast, upper outer quadrant: Secondary | ICD-10-CM | POA: Diagnosis not present

## 2023-06-06 DIAGNOSIS — N6320 Unspecified lump in the left breast, unspecified quadrant: Secondary | ICD-10-CM

## 2023-06-06 DIAGNOSIS — R928 Other abnormal and inconclusive findings on diagnostic imaging of breast: Secondary | ICD-10-CM

## 2023-06-16 ENCOUNTER — Encounter: Payer: Self-pay | Admitting: Family Medicine

## 2023-06-16 NOTE — Telephone Encounter (Signed)
Patient is being seen in acc 12/23 @850 

## 2023-06-19 ENCOUNTER — Ambulatory Visit (HOSPITAL_COMMUNITY)
Admission: RE | Admit: 2023-06-19 | Discharge: 2023-06-19 | Disposition: A | Discharge status: Home / Self Care | Payer: Medicare HMO | Source: Ambulatory Visit | Attending: Family Medicine | Admitting: Family Medicine

## 2023-06-19 ENCOUNTER — Ambulatory Visit (INDEPENDENT_AMBULATORY_CARE_PROVIDER_SITE_OTHER): Payer: Medicare HMO | Admitting: Family Medicine

## 2023-06-19 ENCOUNTER — Encounter: Payer: Self-pay | Admitting: Family Medicine

## 2023-06-19 VITALS — BP 127/60 | HR 81 | Temp 98.4°F | Ht 65.0 in | Wt 195.4 lb

## 2023-06-19 DIAGNOSIS — K219 Gastro-esophageal reflux disease without esophagitis: Secondary | ICD-10-CM

## 2023-06-19 DIAGNOSIS — R052 Subacute cough: Secondary | ICD-10-CM

## 2023-06-19 DIAGNOSIS — R059 Cough, unspecified: Secondary | ICD-10-CM | POA: Diagnosis not present

## 2023-06-19 DIAGNOSIS — Z720 Tobacco use: Secondary | ICD-10-CM

## 2023-06-19 MED ORDER — OMEPRAZOLE 20 MG PO CPDR: 20.0000 mg | DELAYED_RELEASE_CAPSULE | Freq: Every day | ORAL | 0 refills | Status: DC

## 2023-06-19 NOTE — Assessment & Plan Note (Signed)
Patient has had long history of heart burn and history of 1 cm hiatal hernia as well. Is on ozempic which could be exacerbating her GERD as well. However, element of previous dry cough could have been GERD as well.  - omeprazole 20 mg for 8 week course.  - Follow up with PCP  - Avoid GERD triggers

## 2023-06-19 NOTE — Patient Instructions (Signed)
It was wonderful to see you today.  Please bring ALL of your medications with you to every visit.   Today we talked about:  Cough - I wonder if you may have a pneumonia given the change in your cough and the crackles I hear on my exam. We will get a chest xray today given your history of tobacco use. I have also ordered the screening lung cancer CT. They will call you to schedule this at a later time. Once we get the Xray results we will send in antibiotics as appropriate. You can take over the counter Robitussin for cough. This has less side effects and interactions than promethazine. If you have worsening shortness of breath, cough up a lot of blood, or start to have chest pain please immediately go to the emergency department   Please follow up in 1 week   Thank you for choosing [facility].   Please call [phone] with any questions about today's appointment.  Please be sure to schedule follow up at the front desk before you leave today.   Lockie Mola, MD  Family Medicine

## 2023-06-19 NOTE — Progress Notes (Signed)
    SUBJECTIVE:   CHIEF COMPLAINT / HPI:   Cough  No fevers, sometimes has pressure underneath her right breast. Also mentions intermittent chills. This started with a dry cough 3 weeks ago and then turned into a wet cough with some phlegm a week ago. This morning she started coughing up yellow sputum. She does have night sweats normally with menopause but does not endorse worsening of this. She feels like her weight has been stable. She is on ozempic.   GERD Patient endorses occasional heart burn. The aforementioned dry cough is not more common when laying down or at night.  Additionally feels her heart burn more often. No triggers that she has noticed. Has not trialed PPI previously.     PERTINENT  PMH / PSH: GERD, former tobacco use, H/o CVA, H/o hiatal hernia, Obesity on GLP-1  OBJECTIVE:   BP 127/60 (BP Location: Left Arm, Patient Position: Sitting)   Pulse 81   Temp 98.4 F (36.9 C) (Oral)   Ht 5\' 5"  (1.651 m)   Wt 195 lb 6.4 oz (88.6 kg)   SpO2 99%   BMI 32.52 kg/m   General: well appearing, in no acute distress, intermittently coughing  CV: RRR, radial pulses equal and palpable, no BLE edema  Resp: Normal work of breathing on room air, intermittent wet cough, rhonci in middle to lower right lung field, otherwise clear to auscultation, no wheezing, bases audible     ASSESSMENT/PLAN:   Assessment & Plan Subacute cough Given course of cough could be that patient had a viral illness and now has superimposed bacterial pneumonia. No diagnosis of COPD and without wheezing unlikely COPD exacerbation. Bases are audible and patient without other evidence of volume overload, not likely a CHF exacerbation. Given production unlikely that cough is caused by GERD at this time. No weight loss or frank hemoptysis; thus, less likely lung cancer, however, patient is at higher risk given tobacco use history.  - CXR today  - Robitussin prn for symptom relief  - Antibiotics to be ordered  once xray results.  - Follow up with Dr. Lum Babe (PCP) 06/28/2022.   Tobacco use Former tobacco use. 36 pack-year history. Quit in 2019 (less than 15 years ago). Normal previous screening CT scans. Due for lung cancer screening CT.  - Ordered lung cancer low dose screening CT  Gastroesophageal reflux disease, unspecified whether esophagitis present Patient has had long history of heart burn and history of 1 cm hiatal hernia as well. Is on ozempic which could be exacerbating her GERD as well. However, element of previous dry cough could have been GERD as well.  - omeprazole 20 mg for 8 week course.  - Follow up with PCP  - Avoid GERD triggers       Lockie Mola, MD [facility]

## 2023-06-22 ENCOUNTER — Other Ambulatory Visit: Payer: Self-pay | Admitting: Family Medicine

## 2023-06-23 ENCOUNTER — Telehealth: Payer: Self-pay

## 2023-06-23 DIAGNOSIS — J209 Acute bronchitis, unspecified: Secondary | ICD-10-CM

## 2023-06-23 MED ORDER — AMOXICILLIN-POT CLAVULANATE 500-125 MG PO TABS: 1.0000 | ORAL_TABLET | Freq: Three times a day (TID) | ORAL | 0 refills | Status: AC

## 2023-06-23 NOTE — Telephone Encounter (Signed)
Patient LVM on nurse line regarding follow up from visit on 06/19/23.   Returned call to patient. She reports that she is continuing to have chills and cough. She wasn't sure if she was supposed to be prescribed antibiotics.   Unknown fever. States that cough has been dry with intermittent episodes of productive cough with clear phlegm.   She reports that they cough has improved with Robitussin.   Will forward to Dr. Marsh Dolly for further advisement.   Veronda Prude, RN

## 2023-06-23 NOTE — Telephone Encounter (Signed)
Called patient and provided with update. Patient already scheduled with PCP on 06/29/23.  Patient will keep this appointment for follow up.   Veronda Prude, RN

## 2023-06-29 ENCOUNTER — Ambulatory Visit (INDEPENDENT_AMBULATORY_CARE_PROVIDER_SITE_OTHER): Payer: Medicare HMO | Admitting: Family Medicine

## 2023-06-29 ENCOUNTER — Other Ambulatory Visit: Payer: Self-pay | Admitting: Family Medicine

## 2023-06-29 ENCOUNTER — Encounter: Payer: Self-pay | Admitting: Family Medicine

## 2023-06-29 VITALS — BP 132/75 | HR 70 | Temp 98.1°F | Ht 62.0 in | Wt 196.6 lb

## 2023-06-29 DIAGNOSIS — Z87891 Personal history of nicotine dependence: Secondary | ICD-10-CM

## 2023-06-29 DIAGNOSIS — K219 Gastro-esophageal reflux disease without esophagitis: Secondary | ICD-10-CM | POA: Diagnosis not present

## 2023-06-29 DIAGNOSIS — R052 Subacute cough: Secondary | ICD-10-CM

## 2023-06-29 DIAGNOSIS — E039 Hypothyroidism, unspecified: Secondary | ICD-10-CM

## 2023-06-29 DIAGNOSIS — E1169 Type 2 diabetes mellitus with other specified complication: Secondary | ICD-10-CM | POA: Diagnosis not present

## 2023-06-29 DIAGNOSIS — R059 Cough, unspecified: Secondary | ICD-10-CM | POA: Insufficient documentation

## 2023-06-29 LAB — POCT GLYCOSYLATED HEMOGLOBIN (HGB A1C): HbA1c, POC (prediabetic range): 5.8 % (ref 5.7–6.4)

## 2023-06-29 NOTE — Assessment & Plan Note (Signed)
TSH checked today 

## 2023-06-29 NOTE — Assessment & Plan Note (Signed)
 Up to date with her colon cancer screening Plan to obtain H.Pylori testing - scheduled for tomorrow Future order placed May resume Omeprazole after test Consider GI referral for EGD if symptoms persists despite PPI She agreed with the plan

## 2023-06-29 NOTE — Assessment & Plan Note (Signed)
 Due for lung cancer screen CT chest ordered I will contact her with her result as soon as I have it

## 2023-06-29 NOTE — Patient Instructions (Signed)
 Lung Cancer Screening A lung cancer screening is a test that checks for lung cancer when there are no symptoms or history of that disease. The screening is done to look for lung cancer in its very early stages. Finding cancer early improves the chances of successful treatment. It may save your life. Who should have a screening? You should be screened for lung cancer if all of these apply: You currently smoke or you used to smoke. You are between the ages of 51 and 31 years old. Screening may be recommended up to age 58 depending on your overall health and other factors. You have a smoking history of 1 pack of cigarettes a day for 20 years or 2 packs a day for 10 years. How is screening done?  The recommended screening test is a low-dose computed tomography (LDCT) scan. This scan takes detailed images of the lungs. This allows a health care provider to look for abnormal cells. If you are at risk for lung cancer, it is recommended that you get screened once a year. Talk to your health care provider about the risks, benefits, and limitations of screening. What are the benefits of screening? Screening can find lung cancer early, before symptoms start and before it has spread outside of the lungs. The chances of curing lung cancer are greater if the cancer is diagnosed early. What are the risks of screening? The screening may show lung cancer when no cancer is present. Talk with your health care provider about what your results mean. In some cases, your health care provider may do more testing to confirm the results. The screening may not find lung cancer when it is present. You will be exposed to radiation from repeated LDCT tests, which can cause cancer in otherwise healthy people. How can I lower my risk of lung cancer? Make these lifestyle changes to lower your risk of developing lung cancer: Do not use any products that contain nicotine or tobacco. These products include cigarettes, chewing  tobacco, and vaping devices, such as e-cigarettes. If you need help quitting, ask your health care provider. Avoid secondhand smoke. Avoid exposure to radiation. Avoid exposure to radon gas. Have your home checked for radon regularly. Avoid things that cause cancer (carcinogens). Avoid living or working in places with high air pollution or diesel exhaust. Questions to ask your health care provider Am I eligible for lung cancer screening? Does my health insurance cover the cost of lung cancer screening? What happens if the lung cancer screening shows something of concern? How soon will I have results from my lung cancer screening? Is there anything that I need to do to prepare for my lung cancer screening? What happens if I decide not to have lung cancer screening? Where to find more information Ask your health care provider about the risks and benefits of screening. More information and resources are available from these organizations: American Cancer Society (ACS): cancer.org American Lung Association: lung.org National Cancer Institute: cancer.gov Contact a health care provider if: You start to show symptoms of lung cancer, including: A cough that will not go away. High-pitched whistling sounds when you breathe, most often when you breathe out (wheezing). Chest pain. Coughing up blood. Shortness of breath. Weight loss that cannot be explained. Constant tiredness (fatigue). Hoarse voice. Summary Lung cancer screening may find lung cancer before symptoms appear. Finding cancer early improves the chances of successful treatment. It may save your life. The recommended screening test is a low-dose computed tomography (LDCT) scan that  looks for abnormal cells in the lungs. If you are at risk for lung cancer, it is recommended that you get screened once a year. You can make lifestyle changes to lower your risk of lung cancer. Ask your health care provider about the risks and benefits of  screening. This information is not intended to replace advice given to you by your health care provider. Make sure you discuss any questions you have with your health care provider. Document Revised: 06/21/2022 Document Reviewed: 12/02/2020 Elsevier Patient Education  2024 [employer].

## 2023-06-29 NOTE — Assessment & Plan Note (Addendum)
 Here A1C checked today is stable on her current regimen No medication adjustment needed at this time Urine microalbumin ordered

## 2023-06-29 NOTE — Assessment & Plan Note (Signed)
 Improving  Likely due to post viral cough Recent chest xray negative for PNA May continue OTC cough regimen Monitor for improvement

## 2023-06-29 NOTE — Progress Notes (Signed)
    SUBJECTIVE:   CHIEF COMPLAINT / HPI:   Cough This is a chronic problem. The current episode started more than 1 month ago. The problem has been waxing and waning. The problem occurs constantly. Cough characteristics: Preoductive of yellowish sputum x once after taking some home medication. Associated symptoms include chills and heartburn. Pertinent negatives include no chest pain, ear pain, fever, nasal congestion, sore throat, shortness of breath or wheezing. Associated symptoms comments: Feels chest discomfort like a heart burn from her back to the front. Nasal congestion here and there. Treatments tried: OTC cough medication and antibitoic prescribed during her recent visit.   DM2: Here for f/u. She is compliant with Ozempic   2 mg every day. No new concerns.  Heart burn:   Endorses central chest pain which feels like it is coming from her back. This improves with burping. No other GI concerns. She has yet to start her Prilosec due to reviewed she read about it online.   Hypothyroidism: Compliant with her Synthroid  175 mcg every day. Here for F/U.  Hx of tobacco use: Need lung cancer screen.  PERTINENT  PMH / PSH: PMHx reviewed  OBJECTIVE:   BP 132/75   Pulse 70   Temp 98.1 F (36.7 C)   Ht 5' 2 (1.575 m)   Wt 196 lb 9.6 oz (89.2 kg)   SpO2 100%   BMI 35.96 kg/m   Physical Exam Vitals and nursing note reviewed.  Cardiovascular:     Rate and Rhythm: Normal rate and regular rhythm.     Heart sounds: Normal heart sounds. No murmur heard. Pulmonary:     Effort: Pulmonary effort is normal. No respiratory distress.     Breath sounds: Normal breath sounds. No wheezing.  Abdominal:     General: Abdomen is flat. Bowel sounds are normal. There is no distension.     Palpations: Abdomen is soft. There is no mass.     Tenderness: There is no abdominal tenderness. There is no guarding.      ASSESSMENT/PLAN:   Type 2 diabetes mellitus with other specified complication  (HCC) Here A1C checked today is stable on her current regimen No medication adjustment needed at this time Urine microalbumin ordered  Hypothyroidism TSH checked today.  GERD (gastroesophageal reflux disease) Up to date with her colon cancer screening Plan to obtain H.Pylori testing - scheduled for tomorrow Future order placed May resume Omeprazole  after test Consider GI referral for EGD if symptoms persists despite PPI She agreed with the plan  Cough Improving  Likely due to post viral cough Recent chest xray negative for PNA May continue OTC cough regimen Monitor for improvement  History of tobacco abuse Due for lung cancer screen CT chest ordered I will contact her with her result as soon as I have it     Otto Fairly, MD [facility]

## 2023-06-30 ENCOUNTER — Other Ambulatory Visit: Payer: Medicare HMO

## 2023-06-30 DIAGNOSIS — R1013 Epigastric pain: Secondary | ICD-10-CM | POA: Diagnosis not present

## 2023-06-30 DIAGNOSIS — K219 Gastro-esophageal reflux disease without esophagitis: Secondary | ICD-10-CM | POA: Diagnosis not present

## 2023-06-30 LAB — MICROALBUMIN / CREATININE URINE RATIO
Creatinine, Urine: 226.3 mg/dL
Microalb/Creat Ratio: 3 mg/g{creat} (ref 0–29)
Microalbumin, Urine: 6.3 ug/mL

## 2023-06-30 LAB — TSH: TSH: 2.13 u[IU]/mL (ref 0.450–4.500)

## 2023-07-02 LAB — H. PYLORI BREATH TEST: H pylori Breath Test: NEGATIVE

## 2023-07-08 ENCOUNTER — Encounter: Payer: Self-pay | Admitting: Family Medicine

## 2023-07-08 ENCOUNTER — Other Ambulatory Visit: Payer: Self-pay | Admitting: Family Medicine

## 2023-07-10 ENCOUNTER — Other Ambulatory Visit: Payer: Self-pay | Admitting: Family Medicine

## 2023-07-10 MED ORDER — SPIRONOLACTONE 100 MG PO TABS: 100.0000 mg | ORAL_TABLET | Freq: Every day | ORAL | 1 refills | Status: DC

## 2023-07-12 ENCOUNTER — Telehealth: Payer: Self-pay

## 2023-07-12 ENCOUNTER — Encounter: Payer: Self-pay | Admitting: Cardiovascular Disease

## 2023-07-12 NOTE — Telephone Encounter (Signed)
-----   Message from [facility] Genevia Kern S sent at 07/12/2023  8:06 AM EST ----- Ok to schedule CT at [facility]. Rosanna Comment, CMA

## 2023-07-12 NOTE — Telephone Encounter (Signed)
 Spoke with pt. Informed of CT scan on Jan 22nd at [facility] at 10:00. Patient understood . Linnie Riches, CMA

## 2023-07-18 ENCOUNTER — Encounter: Payer: Self-pay | Admitting: Cardiovascular Disease

## 2023-07-19 ENCOUNTER — Ambulatory Visit (HOSPITAL_COMMUNITY)
Admission: RE | Admit: 2023-07-19 | Discharge: 2023-07-19 | Disposition: A | Discharge status: Home / Self Care | Payer: Medicare HMO | Source: Ambulatory Visit | Attending: Family Medicine | Admitting: Family Medicine

## 2023-07-19 ENCOUNTER — Ambulatory Visit (INDEPENDENT_AMBULATORY_CARE_PROVIDER_SITE_OTHER): Payer: Medicare HMO

## 2023-07-19 VITALS — BP 149/83 | HR 80 | Temp 98.8°F | Resp 20 | Ht 65.0 in | Wt 198.6 lb

## 2023-07-19 DIAGNOSIS — E7849 Other hyperlipidemia: Secondary | ICD-10-CM | POA: Diagnosis not present

## 2023-07-19 DIAGNOSIS — I2510 Atherosclerotic heart disease of native coronary artery without angina pectoris: Secondary | ICD-10-CM | POA: Diagnosis not present

## 2023-07-19 DIAGNOSIS — Z87891 Personal history of nicotine dependence: Secondary | ICD-10-CM | POA: Insufficient documentation

## 2023-07-19 DIAGNOSIS — Z8673 Personal history of transient ischemic attack (TIA), and cerebral infarction without residual deficits: Secondary | ICD-10-CM | POA: Diagnosis not present

## 2023-07-19 MED ORDER — INCLISIRAN SODIUM 284 MG/1.5ML ~~LOC~~ SOSY
284.0000 mg | PREFILLED_SYRINGE | Freq: Once | SUBCUTANEOUS | Status: AC
Start: 2023-07-19 — End: 2023-07-19
  Administered 2023-07-19: 284 mg via SUBCUTANEOUS

## 2023-07-19 NOTE — Progress Notes (Signed)
Diagnosis: Hyperlipidemia  Provider:  Chilton Greathouse MD  Procedure: Injection  Leqvio (inclisiran), Dose: 284 mg, Site: subcutaneous, Number of injections: 1  Injection Site(s): Right arm  Post Care: Patient declined observation  Discharge: Condition: Good, Destination: Home . AVS Declined  Performed by:  Wyvonne Lenz, RN

## 2023-07-26 ENCOUNTER — Encounter: Payer: Self-pay | Admitting: Family Medicine

## 2023-07-26 ENCOUNTER — Telehealth: Payer: Self-pay | Admitting: Family Medicine

## 2023-07-26 NOTE — Telephone Encounter (Addendum)
Normal CT lung discussed. Repeat in 1 yr. She also has atherosclerosis and stigmata of COPD with mitral annulus calcification. I discussed these with her. She denies chest pain. Has occasional SOB on exertion. She had a recent ECHO in Jan 2024 and has a close follow-up with cardiology. She will contact her cardiologist for an earlier f/u appointment. Consider repeating ECHO if symptomatic. Continue ASA and Incliciran injection. She agreed with the plan.  She asked about her ophthalmology referral. It seems the previous referral was declined. I will message our referral specialist for this.

## 2023-08-08 ENCOUNTER — Encounter: Payer: Self-pay | Admitting: Family Medicine

## 2023-08-08 NOTE — Telephone Encounter (Addendum)
Called patient regarding concern.   Patient reports that she has been having the "zig zags" for over one month. They are intermittent episodes that will last for about 20 minutes. After vision returns to normal she will have a headache.   She denies facial drooping, arm weakness, balance issues or speech difficulty. Denies new visual changes today.   She would like to know if there is anything our office can do to expedite the referral to see an eye doctor.   Advised patient that I would send message to both PCP and referral coordinator.   Strict ED precautions discussed.   Veronda Prude, RN

## 2023-08-14 ENCOUNTER — Encounter: Payer: Self-pay | Admitting: Family Medicine

## 2023-08-15 ENCOUNTER — Other Ambulatory Visit: Payer: Self-pay | Admitting: Family Medicine

## 2023-08-15 MED ORDER — OZEMPIC (2 MG/DOSE) 8 MG/3ML ~~LOC~~ SOPN: 2.0000 mg | PEN_INJECTOR | SUBCUTANEOUS | 2 refills | Status: DC

## 2023-08-15 MED ORDER — LEVOTHYROXINE SODIUM 175 MCG PO TABS: ORAL_TABLET | ORAL | 1 refills | Status: DC

## 2023-08-27 ENCOUNTER — Encounter: Payer: Self-pay | Admitting: Family Medicine

## 2023-08-28 NOTE — Telephone Encounter (Signed)
 Contacted the patient to get her scheduled for an office visit. Patient is being sen in acc tomorrow @[phone]  seeing Dr.Jones

## 2023-08-29 ENCOUNTER — Ambulatory Visit

## 2023-08-29 ENCOUNTER — Encounter: Payer: Self-pay | Admitting: Family Medicine

## 2023-08-29 ENCOUNTER — Other Ambulatory Visit: Payer: Self-pay | Admitting: Family Medicine

## 2023-08-29 VITALS — BP 139/76 | HR 71 | Ht 65.0 in | Wt 200.4 lb

## 2023-08-29 DIAGNOSIS — R6883 Chills (without fever): Secondary | ICD-10-CM | POA: Diagnosis not present

## 2023-08-29 DIAGNOSIS — H6992 Unspecified Eustachian tube disorder, left ear: Secondary | ICD-10-CM

## 2023-08-29 NOTE — Patient Instructions (Signed)
 It was great to see you! Thank you for allowing me to participate in your care!  I recommend that you always bring your medications to each appointment as this makes it easy to ensure you are on the correct medications and helps [place] not miss when refills are needed.  Our plans for today:  - Symptoms of chills and night sweats is likely your body going through menopause - We will check blood work to rule out anemia   We are checking some labs today, I will call you if they are abnormal will send you a MyChart message or a letter if they are normal.  If you do not hear about your labs in the next 2 weeks please let us know.  Take care and seek immediate care sooner if you develop any concerns.   Dr. Erick Alley, DO [facility]

## 2023-08-29 NOTE — Progress Notes (Unsigned)
    SUBJECTIVE:   CHIEF COMPLAINT / HPI:   Chills Intermittent day or night for past 2 weeks, sometimes multiple x/days - gets goose bumps  No current cough. No fevers or decreased energy level.  No GI symptoms.  States she is otherwise feeling like her normal self. Had chest CT for lung cancer screening which was non concerning for malignancy but showed evidence of COPD (stopped smoking in 2019)  TSH was checked and nml 06/29/2023.  Does have night sweats which she has had for years and states is related to menopause.  Feels like L ear is "clogged" and muffled this morning. This has resolved. No pain, no decreased hearing.   PERTINENT  PMH / PSH: hypothyroidism on synthroid   OBJECTIVE:   BP 139/76   Pulse 71   Ht 5\' 5"  (1.651 m)   Wt 200 lb 6.4 oz (90.9 kg)   SpO2 99%   BMI 33.35 kg/m    General: NAD, pleasant, able to participate in exam HEENT: White sclera, clear conjunctiva, unable to view right TM due to cerumen.  Left TM with diffuse cloudiness and air bubbles behind TM, also appears retracted No submandibular, cervical, supraclavicular lymphadenopathy Cardiac: RRR, no murmurs. Respiratory: CTAB, normal effort, No wheezes, rales or rhonchi Skin: warm and dry Neuro: alert, no obvious focal deficits Psych: Normal affect and mood  ASSESSMENT/PLAN:   Chills Low concern for infection causing intermittent chills/goosebumps given no other significant symptoms, normal vitals and nonconcerning physical exam.  Could be related to menopausal symptoms.  Will rule out anemia. -CBC with differential -Return if persist/worsens and especially if other symptoms develop  Eustachian tube dysfunction, left Symptoms and exam most consistent L eustachian tube dysfunction which she has been diagnosed with previously.  Discussed use of Flonase but patient declined. -CTM     Dr. Erick Alley, DO [place] [facility]

## 2023-08-30 DIAGNOSIS — R6883 Chills (without fever): Secondary | ICD-10-CM | POA: Insufficient documentation

## 2023-08-30 LAB — CBC WITH DIFFERENTIAL/PLATELET
Basophils Absolute: 0.1 10*3/uL (ref 0.0–0.2)
Basos: 1 %
EOS (ABSOLUTE): 0.3 10*3/uL (ref 0.0–0.4)
Eos: 4 %
Hematocrit: 42.6 % (ref 34.0–46.6)
Hemoglobin: 13.7 g/dL (ref 11.1–15.9)
Immature Grans (Abs): 0 10*3/uL (ref 0.0–0.1)
Immature Granulocytes: 0 %
Lymphocytes Absolute: 3 10*3/uL (ref 0.7–3.1)
Lymphs: 42 %
MCH: 26.4 pg — ABNORMAL LOW (ref 26.6–33.0)
MCHC: 32.2 g/dL (ref 31.5–35.7)
MCV: 82 fL (ref 79–97)
Monocytes Absolute: 0.4 10*3/uL (ref 0.1–0.9)
Monocytes: 6 %
Neutrophils Absolute: 3.4 10*3/uL (ref 1.4–7.0)
Neutrophils: 47 %
Platelets: 274 10*3/uL (ref 150–450)
RBC: 5.18 x10E6/uL (ref 3.77–5.28)
RDW: 13 % (ref 11.7–15.4)
WBC: 7.2 10*3/uL (ref 3.4–10.8)

## 2023-08-30 NOTE — Assessment & Plan Note (Signed)
 Symptoms and exam most consistent L eustachian tube dysfunction which she has been diagnosed with previously.  Discussed use of Flonase but patient declined. -CTM

## 2023-08-30 NOTE — Assessment & Plan Note (Signed)
 Low concern for infection causing intermittent chills/goosebumps given no other significant symptoms, normal vitals and nonconcerning physical exam.  Could be related to menopausal symptoms.  Will rule out anemia. -CBC with differential -Return if persist/worsens and especially if other symptoms develop

## 2023-08-31 ENCOUNTER — Encounter: Payer: Self-pay | Admitting: Family Medicine

## 2023-09-04 ENCOUNTER — Encounter: Payer: Self-pay | Admitting: Family Medicine

## 2023-09-05 ENCOUNTER — Telehealth: Payer: Self-pay | Admitting: Student

## 2023-09-05 DIAGNOSIS — R6883 Chills (without fever): Secondary | ICD-10-CM

## 2023-09-05 NOTE — Telephone Encounter (Signed)
 Called pt to discuss recent cbcw/diff showing slightly low MCH.  Discussed this could be insignificant and unlikely to be cause of the chills she has been experiencing.  Discussed it is possibly a sign of IDA although hgb normal.  Pt prefers further work up for IDA. Future lab orders placed. Pt calls to schedule lab only apt.

## 2023-09-07 ENCOUNTER — Encounter: Payer: Self-pay | Admitting: Family Medicine

## 2023-09-11 ENCOUNTER — Other Ambulatory Visit: Payer: Self-pay | Admitting: Family Medicine

## 2023-10-05 ENCOUNTER — Other Ambulatory Visit: Payer: Self-pay | Admitting: Family Medicine

## 2023-10-05 ENCOUNTER — Ambulatory Visit

## 2023-10-05 ENCOUNTER — Encounter: Payer: Self-pay | Admitting: Family Medicine

## 2023-10-05 VITALS — Ht 65.0 in | Wt 194.0 lb

## 2023-10-05 DIAGNOSIS — Z0000 Encounter for general adult medical examination without abnormal findings: Secondary | ICD-10-CM

## 2023-10-05 MED ORDER — CETIRIZINE HCL 10 MG PO TABS: 10.0000 mg | ORAL_TABLET | Freq: Every day | ORAL | 1 refills | Status: DC

## 2023-10-05 NOTE — Telephone Encounter (Signed)
 Done

## 2023-10-05 NOTE — Progress Notes (Signed)
 Because this visit was a virtual/telehealth visit,  certain criteria was not obtained, such a blood pressure, CBG if applicable, and timed get up and go. Any medications not marked as "taking" were not mentioned during the medication reconciliation part of the visit. Any vitals not documented were not able to be obtained due to this being a telehealth visit or patient was unable to self-report a recent blood pressure reading due to a lack of equipment at home via telehealth. Vitals that have been documented are verbally provided by the patient.   Subjective:   Teresa [name] is a 63 y.o. who presents for a Medicare Wellness preventive visit.  Visit Complete: Virtual I connected with  Amity Filippone on 10/05/23 by a video and audio enabled telemedicine application and verified that I am speaking with the correct person using two identifiers.  Patient Location: Home  Provider Location: Office/Clinic  I discussed the limitations of evaluation and management by telemedicine. The patient expressed understanding and agreed to proceed.  Vital Signs: Because this visit was a virtual/telehealth visit, some criteria may be missing or patient reported. Any vitals not documented were not able to be obtained and vitals that have been documented are patient reported.  Persons Participating in Visit: Patient.  AWV Questionnaire: No: Patient Medicare AWV questionnaire was not completed prior to this visit.  Cardiac Risk Factors include: advanced age (>11men, >37 women);dyslipidemia;family history of premature cardiovascular disease;hypertension;obesity (BMI >30kg/m2)     Objective:    Today's Vitals   10/05/23 1646 10/05/23 1647  Weight: 194 lb (88 kg)   Height: 5\' 5"  (1.651 m)   PainSc: 0-No pain 0-No pain   Body mass index is 32.28 kg/m.     10/05/2023    4:51 PM 07/19/2023    8:31 AM 06/19/2023    9:29 AM 04/21/2023    9:28 AM 03/24/2023    8:31 AM 03/03/2023    8:54 AM 01/17/2023   10:10 AM   Advanced Directives  Does Patient Have a Medical Advance Directive? No No No No No No No  Would patient like information on creating a medical advance directive? No - Patient declined No - Patient declined  No - Patient declined No - Patient declined No - Patient declined No - Patient declined    Current Medications (verified) Outpatient Encounter Medications as of 10/05/2023  Medication Sig   aspirin 81 MG chewable tablet Chew 1 tablet (81 mg total) by mouth daily.   baclofen (LIORESAL) 10 MG tablet Take 1 tablet (10 mg total) by mouth at bedtime as needed for muscle spasms.   Blood Glucose Monitoring Suppl DEVI 1 each by Does not apply route in the morning, at noon, and at bedtime. Use to check capillary glucose BID   Carboxymeth-Glyc-Polysorb PF (REFRESH OPTIVE MEGA-3) 0.5-1-0.5 % SOLN Place 1 drop into both eyes 2 (two) times daily. (Patient not taking: Reported on 06/29/2023)   carvedilol (COREG) 25 MG tablet TAKE 1 TABLET BY MOUTH TWICE DAILY WITH MEALS   CEQUA 0.09 % SOLN Apply 1 drop to eye 2 (two) times daily.   cetirizine (ZYRTEC ALLERGY) 10 MG tablet Take 1 tablet (10 mg total) by mouth daily.   diclofenac Sodium (VOLTAREN) 1 % GEL Apply 2 g topically 4 (four) times daily. Apply to shoulder (Patient not taking: Reported on 06/29/2023)   Glucose Blood (BLOOD GLUCOSE TEST STRIPS) STRP 1 each by In Vitro route 2 (two) times daily.   hydrALAZINE (APRESOLINE) 100 MG tablet Take 1 tablet (  100 mg total) by mouth 3 (three) times daily.   inclisiran (LEQVIO) 284 MG/1.5ML SOSY injection Inject 284 mg into the skin once. (Patient not taking: Reported on 06/29/2023)   Lancets Misc. MISC 1 each by Does not apply route in the morning and at bedtime.   levothyroxine (SYNTHROID) 175 MCG tablet TAKE 1 TABLET BY MOUTH ONCE DAILY IN THE MORNING 30  MINUTES  BEFORE  FOOD   omeprazole (PRILOSEC) 20 MG capsule Take 1 capsule (20 mg total) by mouth daily. (Patient not taking: Reported on 06/29/2023)    Semaglutide, 2 MG/DOSE, (OZEMPIC, 2 MG/DOSE,) 8 MG/3ML SOPN Inject 2 mg into the skin once a week.   spironolactone (ALDACTONE) 100 MG tablet Take 1 tablet (100 mg total) by mouth daily.   No facility-administered encounter medications on file as of 10/05/2023.    Allergies (verified) Amlodipine, Bactrim [sulfamethoxazole-trimethoprim], Clonidine derivatives, Lisinopril, Statins, Aspirin, and Latex   History: Past Medical History:  Diagnosis Date   Allergy    Anemia    Cervical disc disorder with radiculopathy of cervical region 02/10/2016   Chronic lower back pain    Claudication in peripheral vascular disease (HCC) 06/12/2020   Diverticulosis    Ear discomfort 03/11/2016   EKG, abnormal 01/06/2017   Gastritis    GERD (gastroesophageal reflux disease)    history   Graves' disease    2005   H/O hiatal hernia    Hiatal hernia    History of amputation of lesser toe of left foot (HCC) 03/11/2015   History of colon cancer    Hypertension    Hyperthyroidism    hx 2005   Hypothyroidism    Lateral knee pain, left 08/06/2013   Left shoulder pain 05/28/2015   Migraine headache    "a few times/yr" (03/05/2015)   Neck pain 02/11/2014   Neck strain 02/06/2012   Numbness and tingling in right hand 05/16/2018   Osteomyelitis (HCC)    Hattie Perch 03/05/2015   Osteomyelitis of toe of left foot (HCC) 03/05/2015   Palpitations 01/05/2016   Positive TB test    1980's had a reaction to TB test   Sleep apnea    Snoring 02/15/2021   Stroke (HCC) 08/2017   Throat pain 09/21/2011   Normal CT and US neck march 2013, repeat Neck US in 1 year to follow-up small nodules.  Head CT 2013: normal ? glosspharyngeal neuralgia.  On amitriptyline await [facility] ENt referral.    Tubular adenoma of colon    Tubular adenoma of colon    Ulnar neuropathy at elbow of right upper extremity 06/12/2018   Past Surgical History:  Procedure Laterality Date   ABDOMINAL HYSTERECTOMY  2006   AMPUTATION TOE Left 03/05/2015    Procedure: AMPUTATION LEFT 5TH  TOE;  Surgeon: Toni Arthurs, MD;  Location: [facility] OR;  Service: Orthopedics;  Laterality: Left;   APPENDECTOMY  2006   BREAST BIOPSY Left 11/26/2014   CESAREAN SECTION  1979 1986 1990   CHOLECYSTECTOMY     COLONOSCOPY     GALLBLADDER SURGERY     LOOP RECORDER INSERTION N/A 09/18/2017   Procedure: LOOP RECORDER INSERTION;  Surgeon: Marinus Maw, MD;  Location: [facility] INVASIVE CV LAB;  Service: Cardiovascular;  Laterality: N/A;   OPEN REDUCTION INTERNAL FIXATION (ORIF) DISTAL RADIAL FRACTURE Right 09/14/2019   Procedure: OPEN REDUCTION INTERNAL FIXATION (ORIF)  RADIAL AND ULNA FRACTURE;  Surgeon: Bradly Bienenstock, MD;  Location: [facility] OR;  Service: Orthopedics;  Laterality: Right;   ORIF ULNAR FRACTURE  Right 09/14/2019   RADIOLOGY WITH ANESTHESIA Left 08/16/2022   Procedure: MRI WITH LEFT SHOULDER WITHOUT CONTRAST,CERVICAL SPINE WITHOUT;  Surgeon: Radiologist, Medication, MD;  Location: [facility] OR;  Service: Radiology;  Laterality: Left;   TEE WITHOUT CARDIOVERSION N/A 09/18/2017   Procedure: TRANSESOPHAGEAL ECHOCARDIOGRAM (TEE);  Surgeon: Chrystie Nose, MD;  Location: [facility] ENDOSCOPY;  Service: Cardiovascular;  Laterality: N/A;   TOE AMPUTATION Left 03/05/2015   5th toe   TUBAL LIGATION Bilateral 1990   UPPER GASTROINTESTINAL ENDOSCOPY     Family History  Problem Relation Age of Onset   Heart disease Mother        CAB age 71   Diabetes Mother    Cancer Father 28       pancreatic cancer   Stroke Father    Pancreatic cancer Sister    Diabetes Sister    Kidney disease Sister        renal failure   Diabetes Brother    Kidney disease Brother        renal failure   Breast cancer Maternal Aunt        per pt aunts and uncles died of cancer not sure the type   Breast cancer Paternal Aunt    Cancer Paternal Uncle    Cancer Sister    Esophageal cancer Neg Hx    Rectal cancer Neg Hx    Stomach cancer Neg Hx    Thyroid disease Neg Hx    Colon cancer Neg Hx    Colon  polyps Neg Hx    Social History   Socioeconomic History   Marital status: Single    Spouse name: Not on file   Number of children: 3   Years of education: 16   Highest education level: Associate degree: occupational, Scientist, product/process development, or vocational program  Occupational History   Occupation: Musician  Tobacco Use   Smoking status: Former    Current packs/day: 0.00    Average packs/day: 1 pack/day for 36.0 years (36.0 ttl pk-yrs)    Types: Cigarettes    Start date: 09/23/1981    Quit date: 09/23/2017    Years since quitting: 6.0   Smokeless tobacco: Never   Tobacco comments:    Tobacco info given 03/01/17  Vaping Use   Vaping status: Never Used  Substance and Sexual Activity   Alcohol use: Never   Drug use: Never   Sexual activity: Not Currently    Birth control/protection: Post-menopausal, Surgical  Other Topics Concern   Not on file  Social History Narrative   Lives with two adult children.  Another daughter lives in [place].  Cosmetologist.  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  Likes to fish and play bingo.  Goes to church.   Social Drivers of Corporate investment banker Strain: Low Risk  (10/05/2023)   Overall Financial Resource Strain (CARDIA)    Difficulty of Paying Living Expenses: Not hard at all  Food Insecurity: No Food Insecurity (10/05/2023)   Hunger Vital Sign    Worried About Running Out of Food in the Last Year: Never true    Ran Out of Food in the Last Year: Never true  Transportation Needs: No Transportation Needs (10/05/2023)   PRAPARE - Administrator, Civil Service (Medical): No    Lack of Transportation (Non-Medical): No  Physical Activity: Sufficiently Active (10/05/2023)   Exercise Vital Sign    Days of Exercise per Week: 5 days    Minutes of Exercise per Session: 30 min  Stress:  No Stress Concern Present (10/05/2023)   [employer] of Occupational Health - Occupational Stress Questionnaire    Feeling of Stress : Not at all  Social Connections:  Moderately Integrated (10/05/2023)   Social Connection and Isolation Panel [NHANES]    Frequency of Communication with Friends and Family: More than three times a week    Frequency of Social Gatherings with Friends and Family: Once a week    Attends Religious Services: More than 4 times per year    Active Member of [employer] or Organizations: Yes    Attends Engineer, structural: More than 4 times per year    Marital Status: Divorced    Tobacco Counseling Counseling given: Not Answered Tobacco comments: Tobacco info given 03/01/17    Clinical Intake:  Pre-visit preparation completed: Yes  Pain : No/denies pain Pain Score: 0-No pain     BMI - recorded: 32.28 Nutritional Status: BMI > 30  Obese Nutritional Risks: None Diabetes: No (PREDIABETIC)  Lab Results  Component Value Date   HGBA1C 5.8 06/29/2023   HGBA1C 5.8 01/17/2023   HGBA1C 6.5 (H) 07/08/2022     How often do you need to have someone help you when you read instructions, pamphlets, or other written materials from your doctor or pharmacy?: 1 - Never What is the last grade level you completed in school?: ASSOCIATE'S DEGREE  Interpreter Needed?: No  Information entered by :: Jonatha Gagen N. Harnoor Kohles, LPN.   Activities of Daily Living     10/05/2023    4:52 PM 04/21/2023    9:54 AM  In your present state of health, do you have any difficulty performing the following activities:  Hearing? 0 0  Vision? 0 0  Comment  Wears eyeglasses  Difficulty concentrating or making decisions? 0 0  Comment BSE: GAMES, READING, PUZZLES   Walking or climbing stairs? 0 0  Dressing or bathing? 0 0  Doing errands, shopping? 0 0  Preparing Food and eating ? N   Using the Toilet? N   In the past six months, have you accidently leaked urine? N   Do you have problems with loss of bowel control? N   Managing your Medications? N   Managing your Finances? N   Housekeeping or managing your Housekeeping? N     Patient Care  Team: Doreene Eland, MD as PCP - General (Family Medicine) Lennette Bihari, MD as PCP - Sleep Medicine (Cardiology) Linna Darner, RD as Dietitian (Family Medicine) Chilton Si, MD as Consulting Physician (Cardiology) Dairl Ponder, MD as Consulting Physician (Ophthalmology) Conley Rolls, My [place], [place] as Referring Physician (Optometry)  Indicate any recent Medical Services you may have received from other than [facility] providers in the past year (date may be approximate).     Assessment:   This is a routine wellness examination for Lynzy.  Hearing/Vision screen Hearing Screening - Comments:: Denies hearing difficulties.  Vision Screening - Comments:: Wears rx glasses - up to date with routine eye exams with My China Le, OD.    Goals Addressed             This Visit's Progress    10/05/2022: My goal is to lose weight and increase my physical activity.         Depression Screen     10/05/2023    4:53 PM 08/29/2023    3:18 PM 07/19/2023    8:31 AM 06/19/2023    9:29 AM 04/21/2023    9:48 AM 03/24/2023  8:31 AM 03/03/2023    8:54 AM  PHQ 2/9 Scores  PHQ - 2 Score 0  0 0 0 0 0  PHQ- 9 Score 0   0 0 0 0  Exception Documentation  Patient refusal         Fall Risk     10/05/2023    4:51 PM 08/29/2023    3:17 PM 07/19/2023    8:31 AM 04/21/2023    9:53 AM 03/24/2023    8:31 AM  Fall Risk   Falls in the past year? 0 0 0 1 1  Number falls in past yr: 0 0  0 0  Injury with Fall? 0 0  1 0  Risk for fall due to : No Fall Risks  No Fall Risks    Follow up Falls prevention discussed;Falls evaluation completed  Falls evaluation completed Falls evaluation completed;Falls prevention discussed     MEDICARE RISK AT HOME:  Medicare Risk at Home Any stairs in or around the home?: No If so, are there any without handrails?: No Home free of loose throw rugs in walkways, pet beds, electrical cords, etc?: Yes Adequate lighting in your home to reduce risk of falls?: Yes Life alert?:  No Use of a cane, walker or w/c?: No Grab bars in the bathroom?: Yes Shower chair or bench in shower?: No Elevated toilet seat or a handicapped toilet?: No  TIMED UP AND GO:  Was the test performed?  No  Cognitive Function: 6CIT completed    10/05/2023    5:06 PM  MMSE - Mini Mental State Exam  Not completed: Unable to complete        10/05/2023    5:05 PM  6CIT Screen  What Year? 0 points  What month? 0 points  What time? 0 points  Count back from 20 0 points  Months in reverse 0 points  Repeat phrase 0 points  Total Score 0 points    Immunizations Immunization History  Administered Date(s) Administered   Influenza Split 07/17/2012   Influenza, Seasonal, Injecte, Preservative Fre 04/21/2023   Influenza,inj,Quad PF,6+ Mos 05/30/2013, 04/15/2014, 05/28/2015, 04/28/2016, 06/28/2017, 04/20/2018, 08/20/2019, 05/03/2021, 04/05/2022   PFIZER Comirnaty(Gray Top)Covid-19 Tri-Sucrose Vaccine 09/25/2020   PFIZER(Purple Top)SARS-COV-2 Vaccination 10/26/2019, 11/23/2019   PNEUMOCOCCAL CONJUGATE-20 05/11/2021   Td 05/27/1998   Tdap 07/17/2012, 07/19/2022    Screening Tests Health Maintenance  Topic Date Due   OPHTHALMOLOGY EXAM  Never done   Zoster Vaccines- Shingrix (1 of 2) Never done   COVID-19 Vaccine (4 - 2024-25 season) 02/26/2023   HEMOGLOBIN A1C  12/27/2023   INFLUENZA VACCINE  01/26/2024   FOOT EXAM  03/02/2024   Diabetic kidney evaluation - eGFR measurement  04/20/2024   Diabetic kidney evaluation - Urine ACR  06/28/2024   Lung Cancer Screening  07/18/2024   Medicare Annual Wellness (AWV)  10/04/2024   MAMMOGRAM  11/28/2024   Colonoscopy  06/04/2027   DTaP/Tdap/Td (4 - Td or Tdap) 07/19/2032   Pneumococcal Vaccine 43-22 Years old  Completed   Hepatitis C Screening  Completed   HIV Screening  Completed   HPV VACCINES  Aged Out   Meningococcal B Vaccine  Aged Out    Health Maintenance  Health Maintenance Due  Topic Date Due   OPHTHALMOLOGY EXAM  Never  done   Zoster Vaccines- Shingrix (1 of 2) Never done   COVID-19 Vaccine (4 - 2024-25 season) 02/26/2023   Health Maintenance Items Addressed: Yes; Patient is due for an eye exam and  Shingrix vaccine.  Additional Screening:  Vision Screening: Recommended annual ophthalmology exams for early detection of glaucoma and other disorders of the eye.  Dental Screening: Recommended annual dental exams for proper oral hygiene  Community Resource Referral / Chronic Care Management: CRR required this visit?  No   CCM required this visit?  No     Plan:     I have personally reviewed and noted the following in the patient's chart:   Medical and social history Use of alcohol, tobacco or illicit drugs  Current medications and supplements including opioid prescriptions. Patient is not currently taking opioid prescriptions. Functional ability and status Nutritional status Physical activity Advanced directives List of other physicians Hospitalizations, surgeries, and ER visits in previous 12 months Vitals Screenings to include cognitive, depression, and falls Referrals and appointments  In addition, I have reviewed and discussed with patient certain preventive protocols, quality metrics, and best practice recommendations. A written personalized care plan for preventive services as well as general preventive health recommendations were provided to patient.     Mickeal Needy, LPN   10/03/8117   After Visit Summary: (MyChart) Due to this being a telephonic visit, the after visit summary with patients personalized plan was offered to patient via MyChart   Notes: Please refer to Routing Comments.

## 2023-10-05 NOTE — Patient Instructions (Addendum)
 Teresa [name] , Thank you for taking time to come for your Medicare Wellness Visit. I appreciate your ongoing commitment to your health goals. Please review the following plan we discussed and let me know if I can assist you in the future.   Referrals/Orders/Follow-Ups/Clinician Recommendations: Keep maintaining your health by keeping your appointments with Dr. Lum Babe and any specialists that you may see.  Call us if you need anything.  Have a great year!!!!  This is a list of the screening recommended for you and due dates:  Health Maintenance  Topic Date Due   Eye exam for diabetics  Never done   Zoster (Shingles) Vaccine (1 of 2) Never done   COVID-19 Vaccine (4 - 2024-25 season) 02/26/2023   Hemoglobin A1C  12/27/2023   Flu Shot  01/26/2024   Complete foot exam   03/02/2024   Yearly kidney function blood test for diabetes  04/20/2024   Yearly kidney health urinalysis for diabetes  06/28/2024   Screening for Lung Cancer  07/18/2024   Medicare Annual Wellness Visit  10/04/2024   Mammogram  11/28/2024   Colon Cancer Screening  06/04/2027   DTaP/Tdap/Td vaccine (4 - Td or Tdap) 07/19/2032   Pneumococcal Vaccination  Completed   Hepatitis C Screening  Completed   HIV Screening  Completed   HPV Vaccine  Aged Out   Meningitis B Vaccine  Aged Out    Advanced directives: Pending completion.  Next Medicare Annual Wellness Visit scheduled for next year: Yes

## 2023-10-20 ENCOUNTER — Other Ambulatory Visit: Payer: Self-pay | Admitting: Family Medicine

## 2023-10-20 ENCOUNTER — Encounter: Payer: Self-pay | Admitting: Family Medicine

## 2023-10-20 DIAGNOSIS — N6320 Unspecified lump in the left breast, unspecified quadrant: Secondary | ICD-10-CM

## 2023-10-20 NOTE — Progress Notes (Addendum)
 Paper request sent in. Will complete electronically for efficiency sake.   NB: I completed this form as well and placed in the front office for faxing.

## 2023-10-22 ENCOUNTER — Encounter: Payer: Self-pay | Admitting: Family Medicine

## 2023-10-23 NOTE — Telephone Encounter (Signed)
 Scheduled patient appointment for tomorrow 04/29 with Dr.Dahbura

## 2023-10-24 ENCOUNTER — Ambulatory Visit: Admitting: Student

## 2023-10-24 VITALS — BP 160/70 | HR 80 | Ht 65.0 in | Wt 194.4 lb

## 2023-10-24 DIAGNOSIS — H6591 Unspecified nonsuppurative otitis media, right ear: Secondary | ICD-10-CM | POA: Diagnosis not present

## 2023-10-24 DIAGNOSIS — H6501 Acute serous otitis media, right ear: Secondary | ICD-10-CM

## 2023-10-24 DIAGNOSIS — I10 Essential (primary) hypertension: Secondary | ICD-10-CM | POA: Diagnosis not present

## 2023-10-24 DIAGNOSIS — H5203 Hypermetropia, bilateral: Secondary | ICD-10-CM | POA: Diagnosis not present

## 2023-10-24 DIAGNOSIS — H9201 Otalgia, right ear: Secondary | ICD-10-CM | POA: Diagnosis not present

## 2023-10-24 MED ORDER — AMOXICILLIN-POT CLAVULANATE 500-125 MG PO TABS: 1.0000 | ORAL_TABLET | Freq: Two times a day (BID) | ORAL | 0 refills | Status: DC

## 2023-10-24 NOTE — Patient Instructions (Addendum)
 It was great to see you today! Thank you for choosing [facility] for your primary care.  Today we addressed: We will treat this as an acute ear infection with antibiotics.  Should this not resolve, please return for reevaluation so we can see if the ear has changed.  If you haven't already, sign up for My Chart to have easy access to your labs results, and communication with your primary care physician.  Return if symptoms worsen or fail to improve. Please arrive 15 minutes before your appointment to ensure smooth check in process.  We appreciate your efforts in making this happen.  Thank you for allowing me to participate in your care, Veronia Goon, DO 10/24/2023, 11:05 AM PGY-3, [facility]

## 2023-10-24 NOTE — Assessment & Plan Note (Signed)
 BP: (!) 160/70 today. Poorly controlled. Goal of <140/90.  Suspect secondary to not having taken her antihypertensives yet this morning.  Reevaluate at future visit with PCP.

## 2023-10-24 NOTE — Progress Notes (Signed)
  SUBJECTIVE:   CHIEF COMPLAINT / HPI:   Sharp pain behind the ear:The sharp pain behind her right ear began on Saturday afternoon, radiating down her neck. The pain was intense and persistent through Sunday, causing significant distress. She describes it as a sensation of a muscle balling up, restricting movement. She has used ice, heat, Tylenol , Bactine, pain pills, and a muscle relaxer, leading to improvement by today, with residual tightness and discomfort.  She recalls a similar episode in 2019 that preceded a stroke, which raises her concern. She is on aspirin  and cholesterol medication due to her stroke history.  She experiences occasional ringing in her ears however not acutely since this concern began, which she attributes to past ear issues. She has no fever, ear drainage, or changes in hearing. She uses saline nasal spray daily for pollen allergies, which cause itchy throat, nasal pressure, and eye irritation.  PERTINENT  PMH / PSH: HTN, hypothyroidism, GERD, HLD, CVA on ASA 81, OSA, CAD, PVD, CKD, migraines, eustachian tube dysfunction on the left  OBJECTIVE:  BP (!) 160/70   Pulse 80   Ht 5\' 5"  (1.651 m)   Wt 194 lb 6.4 oz (88.2 kg)   SpO2 94%   BMI 32.35 kg/m  General: Well-appearing, NAD HEENT: Left ear TM clear, right ear TM hazy with serous effusion, tenderness behind the ear and along sternocleidomastoid Neuro: No focal neurological deficits appreciable, coherent speech  ASSESSMENT/PLAN:   Assessment & Plan Non-recurrent acute serous otitis media of right ear Serous effusion, will treat as otitis media with Augmentin .  At this time not concern for mastoiditis nor neurological involvement. Primary hypertension BP: (!) 160/70 today. Poorly controlled. Goal of <140/90.  Suspect secondary to not having taken her antihypertensives yet this morning.  Reevaluate at future visit with PCP. Return if symptoms worsen or fail to improve. Veronia Goon, DO 10/24/2023, 11:24  AM PGY-3, [facility]

## 2023-10-25 ENCOUNTER — Other Ambulatory Visit: Payer: Self-pay | Admitting: Family Medicine

## 2023-10-25 ENCOUNTER — Encounter: Payer: Self-pay | Admitting: Family Medicine

## 2023-10-25 MED ORDER — TRAMADOL HCL 50 MG PO TABS: 50.0000 mg | ORAL_TABLET | Freq: Two times a day (BID) | ORAL | 0 refills | Status: AC | PRN

## 2023-10-27 NOTE — Progress Notes (Signed)
 Me to [name]      10/27/23  1:27 PM Hello,   I am uncertain if you were referred. However, if your symptoms is not getting better, I will go ahead and place ENT referral to make sure we are not missing anything. Please see me soon or go to the ED if symptoms worsens.   Dr. Grandville Lax  This MyChart message has not been read.   10/27/23  1:09 PM [name], [name], RN routed this conversation to Me [name] to P [facility] Rn Team (supporting You)      10/27/23 12:33 PM When I came in Tuesday,  Dr.Duhbura mention sending me to ENT. I prefer not go back the last ENT. Is it possible that wax is clogging behind my eardrum. I want to make sure this doesn't happen again

## 2023-10-27 NOTE — Addendum Note (Signed)
 Addended by: Penni Bowman T on: 10/27/2023 01:31 PM   Modules accepted: Orders

## 2023-11-06 DIAGNOSIS — H9313 Tinnitus, bilateral: Secondary | ICD-10-CM | POA: Diagnosis not present

## 2023-11-06 DIAGNOSIS — H9201 Otalgia, right ear: Secondary | ICD-10-CM | POA: Diagnosis not present

## 2023-11-15 ENCOUNTER — Encounter: Payer: Self-pay | Admitting: Family Medicine

## 2023-11-15 ENCOUNTER — Other Ambulatory Visit: Payer: Self-pay | Admitting: Family Medicine

## 2023-11-21 ENCOUNTER — Other Ambulatory Visit: Payer: Self-pay | Admitting: Family Medicine

## 2023-12-04 ENCOUNTER — Encounter: Payer: Self-pay | Admitting: Family Medicine

## 2023-12-06 ENCOUNTER — Ambulatory Visit
Admission: RE | Admit: 2023-12-06 | Discharge: 2023-12-06 | Disposition: A | Discharge status: Home / Self Care | Source: Ambulatory Visit | Attending: Family Medicine | Admitting: Family Medicine

## 2023-12-06 ENCOUNTER — Ambulatory Visit: Payer: Self-pay | Admitting: Family Medicine

## 2023-12-06 DIAGNOSIS — N6320 Unspecified lump in the left breast, unspecified quadrant: Secondary | ICD-10-CM

## 2023-12-06 DIAGNOSIS — N6321 Unspecified lump in the left breast, upper outer quadrant: Secondary | ICD-10-CM | POA: Diagnosis not present

## 2023-12-13 DIAGNOSIS — E0500 Thyrotoxicosis with diffuse goiter without thyrotoxic crisis or storm: Secondary | ICD-10-CM | POA: Diagnosis not present

## 2023-12-13 DIAGNOSIS — H50111 Monocular exotropia, right eye: Secondary | ICD-10-CM | POA: Diagnosis not present

## 2023-12-13 DIAGNOSIS — E119 Type 2 diabetes mellitus without complications: Secondary | ICD-10-CM | POA: Diagnosis not present

## 2023-12-13 DIAGNOSIS — H04123 Dry eye syndrome of bilateral lacrimal glands: Secondary | ICD-10-CM | POA: Diagnosis not present

## 2023-12-13 LAB — HM DIABETES EYE EXAM

## 2024-01-09 DIAGNOSIS — H35033 Hypertensive retinopathy, bilateral: Secondary | ICD-10-CM | POA: Diagnosis not present

## 2024-01-09 DIAGNOSIS — E0500 Thyrotoxicosis with diffuse goiter without thyrotoxic crisis or storm: Secondary | ICD-10-CM | POA: Diagnosis not present

## 2024-01-09 DIAGNOSIS — H3581 Retinal edema: Secondary | ICD-10-CM | POA: Diagnosis not present

## 2024-01-17 ENCOUNTER — Ambulatory Visit: Payer: Medicare HMO

## 2024-01-17 VITALS — BP 154/92 | HR 71 | Temp 97.9°F | Resp 14 | Ht 65.0 in | Wt 197.4 lb

## 2024-01-17 DIAGNOSIS — I2510 Atherosclerotic heart disease of native coronary artery without angina pectoris: Secondary | ICD-10-CM

## 2024-01-17 DIAGNOSIS — E7849 Other hyperlipidemia: Secondary | ICD-10-CM

## 2024-01-17 DIAGNOSIS — Z8673 Personal history of transient ischemic attack (TIA), and cerebral infarction without residual deficits: Secondary | ICD-10-CM

## 2024-01-17 MED ORDER — INCLISIRAN SODIUM 284 MG/1.5ML ~~LOC~~ SOSY
284.0000 mg | PREFILLED_SYRINGE | Freq: Once | SUBCUTANEOUS | Status: AC
Start: 2024-01-17 — End: 2024-01-17
  Administered 2024-01-17: 284 mg via SUBCUTANEOUS
  Filled 2024-01-17: qty 1.5

## 2024-01-17 NOTE — Progress Notes (Signed)
 Diagnosis: Hyperlipidemia  Provider:  Chilton Greathouse MD  Procedure: Injection  Leqvio (inclisiran), Dose: 284 mg, Site: subcutaneous, Number of injections: 1  Injection Site(s): Right arm  Post Care:     Discharge: Condition: Good, Destination: Home . AVS Declined  Performed by:  Garnette Czech, RN

## 2024-01-18 ENCOUNTER — Encounter: Payer: Self-pay | Admitting: Family Medicine

## 2024-01-18 DIAGNOSIS — E0500 Thyrotoxicosis with diffuse goiter without thyrotoxic crisis or storm: Secondary | ICD-10-CM | POA: Diagnosis not present

## 2024-01-23 ENCOUNTER — Encounter: Payer: Self-pay | Admitting: Family Medicine

## 2024-01-23 ENCOUNTER — Ambulatory Visit (INDEPENDENT_AMBULATORY_CARE_PROVIDER_SITE_OTHER): Admitting: Family Medicine

## 2024-01-23 VITALS — BP 140/95 | HR 68 | Ht 65.0 in | Wt 199.0 lb

## 2024-01-23 DIAGNOSIS — E1169 Type 2 diabetes mellitus with other specified complication: Secondary | ICD-10-CM | POA: Diagnosis not present

## 2024-01-23 DIAGNOSIS — I10 Essential (primary) hypertension: Secondary | ICD-10-CM

## 2024-01-23 DIAGNOSIS — E038 Other specified hypothyroidism: Secondary | ICD-10-CM

## 2024-01-23 DIAGNOSIS — K219 Gastro-esophageal reflux disease without esophagitis: Secondary | ICD-10-CM | POA: Diagnosis not present

## 2024-01-23 DIAGNOSIS — Z6832 Body mass index (BMI) 32.0-32.9, adult: Secondary | ICD-10-CM | POA: Diagnosis not present

## 2024-01-23 LAB — POCT GLYCOSYLATED HEMOGLOBIN (HGB A1C): HbA1c, POC (controlled diabetic range): 5.7 % (ref 0.0–7.0)

## 2024-01-23 MED ORDER — MOUNJARO 5 MG/0.5ML ~~LOC~~ SOAJ: 5.0000 mg | SUBCUTANEOUS | 1 refills | Status: DC

## 2024-01-23 MED ORDER — TEPEZZA 500 MG IV SOLR: 1.0000 mg | Freq: Once | INTRAVENOUS | Status: DC

## 2024-01-23 MED ORDER — OZEMPIC (2 MG/DOSE) 8 MG/3ML ~~LOC~~ SOPN: PEN_INJECTOR | SUBCUTANEOUS | Status: DC

## 2024-01-23 MED ORDER — OMEPRAZOLE 20 MG PO CPDR: 20.0000 mg | DELAYED_RELEASE_CAPSULE | Freq: Every day | ORAL | 1 refills | Status: DC | PRN

## 2024-01-23 MED ORDER — BACLOFEN 10 MG PO TABS: 10.0000 mg | ORAL_TABLET | Freq: Every evening | ORAL | 1 refills | Status: DC | PRN

## 2024-01-23 NOTE — Assessment & Plan Note (Signed)
 Blood pressure elevated at 161/95 mmHg.  Improved with recheck Hydralazine  taken twice daily instead of prescribed three times daily. - Recheck blood pressure for accuracy. - Recommend taking hydralazine  100 mg three times daily as prescribed. - Follow up in 4 weeks to reassess blood pressure management.

## 2024-01-23 NOTE — Assessment & Plan Note (Addendum)
 A1c at 5.7% indicates good glycemic control. Concerns about interactions with Tepezza  affecting blood glucose levels. - Monitor blood glucose levels closely if starting Tepezza  treatment. - Review potential interactions between Tepezza  and current medications and communicate findings via MyChart. - Will switch from Ozempic  to Mounjaro  for better weight management as well.

## 2024-01-23 NOTE — Progress Notes (Addendum)
 [employer] documentation - verbal consent obtained from the patient.  History of Present Illness Sharian Delia is a 63 year old female with thyroid  eye disease who presents with concerns about starting Tepezza .  She is experiencing symptoms of thyroid  eye disease, including bulging, burning, and dryness of the eyes. She has a history of a lazy eye that has worsened. She has not yet started Tepezza  and is concerned about potential side effects such as muscle spasms and impacts on blood pressure and blood sugar.  She has a history of hypertension and is currently taking Coreg  25 mg twice daily, hydralazine  100 mg twice daily, and Aldactone  100 mg daily. Due to a change in her work schedule, she takes her blood pressure medication in the morning and at night.  She manages her diabetes with Ozempic  2 mg weekly, with an A1c of 5.7, and reports no weight loss. She is considering a change in medication for better weight management.  She experiences intermittent shoulder pain and a recurring painful spot on her back, described as feeling like something is stuck. She has used muscle relaxants in the past but does not currently have any.  She reports a sensation of 'bubbles' or gas around her heart associated with exercise, accompanied by belching and gas but no pain. She has a history of acid reflux and was previously prescribed omeprazole , which she has not been taking regularly.  Medications - Baby aspirin  - Baclofen  (as needed) - Coreg  - Cequa  - Refresh - Zyrtec  - Hydralazine  - Synthroid  - Omeprazole  - Prilosec - Ozempic  - Aldactone   Physical Exam Vitals:   01/23/24 0934 01/23/24 0957 01/23/24 0958  BP: (!) 177/95 (!) 161/90 (!) 140/95  Pulse: 68    SpO2: 97%    Weight: 199 lb (90.3 kg)    Height: 5' 5 (1.651 m)      VITALS: BP- 161/90 CHEST: Lungs clear to auscultation bilaterally. CARDIOVASCULAR: Heart sounds normal, no murmurs. ABDOMEN: No abdominal masses  palpated. EXTREMITIES: No edema in extremities.  Results A1c: 5.7% (01/23/2024)  Assessment and Plan Hypertension Blood pressure elevated at 161/95 mmHg.  Improved with recheck Hydralazine  taken twice daily instead of prescribed three times daily. - Recheck blood pressure for accuracy. - Recommend taking hydralazine  100 mg three times daily as prescribed. - Follow up in 4 weeks to reassess blood pressure management.  Type 2 diabetes mellitus A1c at 5.7% indicates good glycemic control. Concerns about interactions with Tepezza  affecting blood glucose levels. - Monitor blood glucose levels closely if starting Tepezza  treatment. - Review potential interactions between Tepezza  and current medications and communicate findings via MyChart. - Will switch from Ozempic  to Mounjaro  for better weight management as well.   Hypothyroidism Managed with Synthroid  175 mcg daily.  Obesity Current weight management with Ozempic  2 mg weekly ineffective. Discussed potential switch to Mounjaro . - Obtain prior authorization for Mounjaro . - Once approved, transition from Ozempic  to Mounjaro  with equivalent dosing. - Start Mounjaro  the week following the last Ozempic  dose.  Thyroid  eye disease (Graves' ophthalmopathy) Considering Tepezza  infusions. Concerns about side effects including muscle spasms, hearing issues, and effects on blood pressure and glucose levels. - Discuss potential side effects and monitoring plan with ophthalmologist. - Monitor for side effects during and after Tepezza  infusions. - Communicate with ophthalmologist regarding any adverse effects experienced.  Muscle spasm of back Intermittent muscle spasms, possibly exacerbated by Tepezza  treatment. Previous use of muscle relaxants was beneficial. - Refill baclofen  for muscle spasms as needed.  Gastroesophageal reflux  disease (GERD) Intermittent symptoms of reflux and gas. Previous use of omeprazole  was beneficial. - Restart  omeprazole  20 mg daily for GERD management. - Send prescription for omeprazole  to pharmacy.  Otto Fairly, MD [facility]

## 2024-01-23 NOTE — Assessment & Plan Note (Addendum)
 Managed with Synthroid  175 mcg daily. Considering Tepezza  infusions. Concerns about side effects including muscle spasms, hearing issues, and effects on blood pressure and glucose levels. - Discuss potential side effects and monitoring plan with ophthalmologist. - Monitor for side effects during and after Tepezza  infusions. - Communicate with ophthalmologist regarding any adverse effects experienced.

## 2024-01-23 NOTE — Assessment & Plan Note (Signed)
 Current weight management with Ozempic  2 mg weekly ineffective. Discussed potential switch to Mounjaro . - Obtain prior authorization for Mounjaro . - Once approved, transition from Ozempic  to Mounjaro  with equivalent dosing. - Start Mounjaro  the week following the last Ozempic  dose.

## 2024-01-23 NOTE — Addendum Note (Signed)
 Addended by: ANDERS CUMINS T on: 01/23/2024 01:09 PM   Modules accepted: Orders

## 2024-01-23 NOTE — Patient Instructions (Signed)
 Teprotumumab  Injection What is this medication? TEPROTUMUMAB  (tep roe toom ue mab) treats thyroid  eye disease. It works by decreasing inflammation. This decreases symptoms, such as redness, swelling, irritation, or bulging of the eyes. It may also improve double vision. It is a monoclonal antibody. This medicine may be used for other purposes; ask your health care provider or pharmacist if you have questions. COMMON BRAND NAME(S): TEPEZZA  What should I tell my care team before I take this medication? They need to know if you have any of these conditions: Diabetes Inflammatory bowel disease An unusual or allergic reaction to teprotumumab , other medications, foods, dyes, or preservatives Pregnant or trying to get pregnant Breast-feeding How should I use this medication? This medication is injected into a vein. It is usually given by a care team in a hospital or clinic setting. Talk to your care team about the use of this medication in children. Special care may be needed. Overdosage: If you think you have taken too much of this medicine contact a poison control center or emergency room at once. NOTE: This medicine is only for you. Do not share this medicine with others. What if I miss a dose? It is important not to miss your dose. Call your care team if you are unable to keep an appointment. What may interact with this medication? Interactions have not been studied. This list may not describe all possible interactions. Give your health care provider a list of all the medicines, herbs, non-prescription drugs, or dietary supplements you use. Also tell them if you smoke, drink alcohol , or use illegal drugs. Some items may interact with your medicine. What should I watch for while using this medication? Visit your care team for regular checks on your progress. Tell your care team if your symptoms do not start to get better or if they get worse. Talk to your care team if you or your partner wish to  become pregnant or think either of you might be pregnant. This medication can cause serious birth defects if taken during pregnancy or for 6 months after stopping therapy. This medication may increase blood sugar. The risk may be higher in patients who already have diabetes. Ask your care team what you can do to lower your risk of diabetes while taking this medication. This medication can cause serious infusion reactions. To reduce the risk, your care team may give you other medications to take before receiving this one. Be sure to follow the directions from your care team. What side effects may I notice from receiving this medication? Side effects that you should report to your care team as soon as possible: Allergic reactions--skin rash, itching, hives, swelling of the face, lips, tongue, or throat High blood sugar (hyperglycemia)--increased thirst or amount of urine, unusual weakness or fatigue, blurry vision Infusion reactions--chest pain, shortness of breath or trouble breathing, feeling faint or lightheaded Side effects that usually do not require medical attention (report these to your care team if they continue or are bothersome): Dry skin Hair loss Hearing loss Heavy periods Irregular menstrual cycles or spotting Menstrual cramps Nausea This list may not describe all possible side effects. Call your doctor for medical advice about side effects. You may report side effects to FDA at 1-800-FDA-1088. Where should I keep my medication? This medication is given in a hospital or clinic. It will not be stored at home. NOTE: This sheet is a summary. It may not cover all possible information. If you have questions about this medicine, talk to  your doctor, pharmacist, or health care provider.  2024 Elsevier/Gold Standard (2021-09-21 00:00:00)

## 2024-02-01 ENCOUNTER — Ambulatory Visit (INDEPENDENT_AMBULATORY_CARE_PROVIDER_SITE_OTHER)

## 2024-02-01 ENCOUNTER — Ambulatory Visit: Admission: EM | Admit: 2024-02-01 | Discharge: 2024-02-01 | Disposition: A | Discharge status: Home / Self Care

## 2024-02-01 DIAGNOSIS — M25512 Pain in left shoulder: Secondary | ICD-10-CM

## 2024-02-01 DIAGNOSIS — I771 Stricture of artery: Secondary | ICD-10-CM | POA: Diagnosis not present

## 2024-02-01 MED ORDER — TIZANIDINE HCL 4 MG PO TABS: 2.0000 mg | ORAL_TABLET | Freq: Four times a day (QID) | ORAL | 0 refills | Status: DC | PRN

## 2024-02-01 MED ORDER — PREDNISONE 20 MG PO TABS: 40.0000 mg | ORAL_TABLET | Freq: Every day | ORAL | 0 refills | Status: AC

## 2024-02-01 NOTE — ED Triage Notes (Signed)
 About a month ago I started with mid upper back pain, it did start just after staring some exercise equipment, now the pain seems to be going down left shoulder and upper arm. No chest pain. No sob. My doctor will not be back until the 15th but I can't wait that long.

## 2024-02-01 NOTE — Discharge Instructions (Addendum)
  Monitor glucose levels as they may be elevated with steroid treatment.

## 2024-02-01 NOTE — ED Provider Notes (Signed)
 [name]    CSN: [id number] Arrival date & time: 02/01/24  1555      History   Chief Complaint Chief Complaint  Patient presents with   Back Pain    HPI Teresa [name] is a 63 y.o. female.   Patient here today for evaluation of mid upper back pain that started about a month ago.  She reports that this started after using exercise equipment.  She has had flares like this in the past.  She states that at times she has pain go down her left shoulder.  She denies any chest pain or shortness of breath.  She would like chest x-ray possible as she has family history of lung cancer which worries her with continued shoulder pain.  The history is provided by the patient.    Past Medical History:  Diagnosis Date   Allergy    Anemia    Cervical disc disorder with radiculopathy of cervical region 02/10/2016   Chronic lower back pain    Claudication in peripheral vascular disease (HCC) 06/12/2020   Diverticulosis    Ear discomfort 03/11/2016   EKG, abnormal 01/06/2017   Gastritis    GERD (gastroesophageal reflux disease)    history   Graves' disease    2005   H/O hiatal hernia    Hiatal hernia    History of amputation of lesser toe of left foot (HCC) 03/11/2015   History of colon cancer    Hypertension    Hyperthyroidism    hx 2005   Hypothyroidism    Lateral knee pain, left 08/06/2013   Left shoulder pain 05/28/2015   Migraine headache    a few times/yr (03/05/2015)   Neck pain 02/11/2014   Neck strain 02/06/2012   Numbness and tingling in right hand 05/16/2018   Osteomyelitis (HCC)    thelbert 03/05/2015   Osteomyelitis of toe of left foot (HCC) 03/05/2015   Palpitations 01/05/2016   Positive TB test    1980's had a reaction to TB test   Sleep apnea    Snoring 02/15/2021   Stroke (HCC) 08/2017   Throat pain 09/21/2011   Normal CT and US  neck march 2013, repeat Neck US  in 1 year to follow-up small nodules.  Head CT 2013: normal ? glosspharyngeal neuralgia.   On amitriptyline  await [facility] EN[facility] referral.    Tubular adenoma of colon    Tubular adenoma of colon    Ulnar neuropathy at elbow of right upper extremity 06/12/2018    Patient Active Problem List   Diagnosis Date Noted   Chills 08/30/2023   Cough 06/29/2023   BMI 32.0-32.9,adult 04/21/2023   Leg pain 03/24/2023   Hypercalcemia 03/03/2023   Right arm pain 01/17/2023   Type 2 diabetes mellitus with other specified complication (HCC) 01/17/2023   Chronic back pain 10/04/2022   Eustachian tube dysfunction, left 05/03/2022   Vitreous flashes of both eyes 11/26/2021   Obstructive sleep apnea 07/05/2021   Bertolotti's syndrome 05/26/2021   Cervical radiculopathy 05/11/2021   Fatigue 05/11/2021   CAD in native artery 01/12/2021   Obese 01/11/2021   Peripheral vascular disease (HCC) 06/12/2020   History of forearm fracture 05/01/2020   History of hiatal hernia 05/01/2020   CKD (chronic kidney disease), stage II 01/15/2020   Tubular adenoma of colon 09/14/2019   Grade I diastolic dysfunction 09/14/2019   COVID-19 07/02/2019   Ulnar neuropathy at elbow of right upper extremity 06/12/2018   Migraine 01/12/2018   Hyperlipidemia    History  of CVA (cerebrovascular accident) 09/15/2017   History of cholecystectomy 06/27/2017   Small intestinal bacterial overgrowth 03/23/2017   Paresthesia 01/06/2017   Tendinopathy of left rotator cuff 10/27/2015   Left shoulder pain 05/28/2015   History of amputation of lesser toe, left (HCC) 03/11/2015   Resistant hypertension    Thyroid  nodule 02/12/2013   GERD (gastroesophageal reflux disease) 02/06/2012   History of tobacco abuse 09/21/2011   Hypertension 09/21/2011   Hypothyroidism 09/21/2011    Past Surgical History:  Procedure Laterality Date   ABDOMINAL HYSTERECTOMY  2006   AMPUTATION TOE Left 03/05/2015   Procedure: AMPUTATION LEFT 5TH  TOE;  Surgeon: Norleen Armor, MD;  Location: [facility] OR;  Service: Orthopedics;  Laterality: Left;    APPENDECTOMY  2006   BREAST BIOPSY Left 11/26/2014   CESAREAN SECTION  1979 1986 1990   CHOLECYSTECTOMY     COLONOSCOPY     GALLBLADDER SURGERY     LOOP RECORDER INSERTION N/A 09/18/2017   Procedure: LOOP RECORDER INSERTION;  Surgeon: Waddell Danelle ORN, MD;  Location: [facility] INVASIVE CV LAB;  Service: Cardiovascular;  Laterality: N/A;   OPEN REDUCTION INTERNAL FIXATION (ORIF) DISTAL RADIAL FRACTURE Right 09/14/2019   Procedure: OPEN REDUCTION INTERNAL FIXATION (ORIF)  RADIAL AND ULNA FRACTURE;  Surgeon: Shari Easter, MD;  Location: [facility] OR;  Service: Orthopedics;  Laterality: Right;   ORIF ULNAR FRACTURE Right 09/14/2019   RADIOLOGY WITH ANESTHESIA Left 08/16/2022   Procedure: MRI WITH LEFT SHOULDER WITHOUT CONTRAST,CERVICAL SPINE WITHOUT;  Surgeon: Radiologist, Medication, MD;  Location: [facility] OR;  Service: Radiology;  Laterality: Left;   TEE WITHOUT CARDIOVERSION N/A 09/18/2017   Procedure: TRANSESOPHAGEAL ECHOCARDIOGRAM (TEE);  Surgeon: Mona Vinie BROCKS, MD;  Location: [facility] ENDOSCOPY;  Service: Cardiovascular;  Laterality: N/A;   TOE AMPUTATION Left 03/05/2015   5th toe   TUBAL LIGATION Bilateral 1990   UPPER GASTROINTESTINAL ENDOSCOPY      OB History   No obstetric history on file.      Home Medications    Prior to Admission medications   Medication Sig Start Date End Date Taking? Authorizing Provider  Acetaminophen  325 MG CAPS Take 2 capsules by mouth as directed. 01/25/24  Yes [provider]  EPINEPHrine  (EPIPEN  JR) 0.15 MG/0.3ML injection Inject 0.3 mg into the muscle as needed. 01/25/24  Yes [provider]  levothyroxine  (SYNTHROID ) 175 MCG tablet TAKE 1 TABLET BY MOUTH ONCE DAILY IN THE MORNING 30  MINUTES  BEFORE  FOOD 08/15/23  Yes Anders Cumins T, MD  polyethylene glycol powder (GLYCOLAX /MIRALAX ) 17 GM/SCOOP powder Take 17 g by mouth daily. 11/04/16  Yes [provider]  predniSONE  (DELTASONE ) 20 MG tablet Take 2 tablets (40 mg total) by mouth daily with  breakfast for 5 days. 02/01/24 02/06/24 Yes Billy Asberry FALCON, PA-C  Semaglutide , 2 MG/DOSE, (OZEMPIC , 2 MG/DOSE,) 8 MG/3ML SOPN Inject 2 mg into the skin every 7 (seven) days. 10/10/23  Yes [provider]  teprotumumab -trbw (TEPEZZA ) 500 MG injection Inject 1,790 mg into the vein as directed. 01/25/24  Yes [provider]  tiZANidine  (ZANAFLEX ) 4 MG tablet Take 0.5-1 tablets (2-4 mg total) by mouth every 6 (six) hours as needed for muscle spasms. 02/01/24  Yes Billy Asberry FALCON, PA-C  amoxicillin  (AMOXIL ) 250 MG capsule Take 250 mg by mouth 2 (two) times daily.    [provider]  aspirin  81 MG chewable tablet Chew 1 tablet (81 mg total) by mouth daily. 09/19/17   Benjamine Hamilton, DO  Blood Glucose Monitoring Suppl  DEVI 1 each by Does not apply route in the morning, at noon, and at bedtime. Use to check capillary glucose BID 04/22/23   Anders Cumins T, MD  Carboxymeth-Glyc-Polysorb PF (REFRESH OPTIVE MEGA-3) 0.5-1-0.5 % SOLN Place 1 drop into both eyes 2 (two) times daily. Patient not taking: Reported on 06/29/2023    [provider]  carboxymethylcellulose (REFRESH PLUS) 0.5 % SOLN Apply 1 drop to eye 2 (two) times daily as needed.    [provider]  carvedilol  (COREG ) 25 MG tablet TAKE 1 TABLET BY MOUTH TWICE DAILY WITH MEALS 09/11/23   Anders Cumins T, MD  CEQUA  0.09 % SOLN Apply 1 drop to eye 2 (two) times daily. 04/15/22   [provider]  cetirizine  (ZYRTEC  ALLERGY) 10 MG tablet Take 1 tablet (10 mg total) by mouth daily. 10/05/23   Anders Cumins DASEN, MD  diclofenac  Sodium (VOLTAREN ) 1 % GEL Apply 2 g topically 4 (four) times daily. Apply to shoulder Patient not taking: Reported on 03/03/2023 01/12/23   Anders Cumins DASEN, MD  ezetimibe  (ZETIA ) 10 MG tablet Take 1 tablet by mouth daily.    [provider]  glucose blood (ACCU-CHEK GUIDE TEST) test strip USE 1 TEST STRIP TWICE DAILY 11/21/23   Anders Cumins DASEN, MD  hydrALAZINE  (APRESOLINE ) 100 MG  tablet TAKE 1 TABLET BY MOUTH THREE TIMES DAILY Patient taking differently: Take 100 mg by mouth 3 (three) times daily. Takes 200 in am and than 100 pm 11/15/23   Anders Cumins T, MD  hydrOXYzine  (ATARAX ) 25 MG tablet Take 25 mg by mouth every 8 (eight) hours as needed.    [provider]  inclisiran (LEQVIO ) 284 MG/1.5ML SOSY injection Inject 284 mg into the skin once.    [provider]  Lancets Misc. MISC 1 each by Does not apply route in the morning and at bedtime. 04/22/23   Anders Cumins DASEN, MD  ofloxacin  (OCUFLOX ) 0.3 % ophthalmic solution Place 1 drop into both eyes 4 (four) times daily.    [provider]  omeprazole  (PRILOSEC) 20 MG capsule Take 1 capsule (20 mg total) by mouth daily as needed. 01/23/24   Anders Cumins DASEN, MD  spironolactone  (ALDACTONE ) 100 MG tablet Take 1 tablet (100 mg total) by mouth daily. 07/10/23   Anders Cumins DASEN, MD  tirzepatide  (MOUNJARO ) 5 MG/0.5ML Pen Inject 5 mg into the skin once a week. 01/23/24   Anders Cumins DASEN, MD  triamcinolone  cream (KENALOG ) 0.1 % Apply 1 Application topically 2 (two) times daily.    [provider]  triazolam  (HALCION ) 0.25 MG tablet Take 0.25 mg by mouth as needed.    [provider]    Family History Family History  Problem Relation Age of Onset   Heart disease Mother        CAB age 53   Diabetes Mother    Cancer Father 21       pancreatic cancer   Stroke Father    Pancreatic cancer Sister    Diabetes Sister    Kidney disease Sister        renal failure   Diabetes Brother    Kidney disease Brother        renal failure   Breast cancer Maternal Aunt        per pt aunts and uncles died of cancer not sure the type   Breast cancer Paternal Aunt    Cancer Paternal Uncle    Cancer Sister    Esophageal cancer  Neg Hx    Rectal cancer Neg Hx    Stomach cancer Neg Hx    Thyroid  disease Neg Hx    Colon cancer Neg Hx    Colon polyps Neg Hx     Social History Social  History   Tobacco Use   Smoking status: Former    Current packs/day: 0.00    Average packs/day: 1 pack/day for 36.0 years (36.0 ttl pk-yrs)    Types: Cigarettes    Start date: 09/23/1981    Quit date: 09/23/2017    Years since quitting: 6.3   Smokeless tobacco: Never   Tobacco comments:    Tobacco info given 03/01/17  Vaping Use   Vaping status: Never Used  Substance Use Topics   Alcohol  use: Never   Drug use: Never     Allergies   Amlodipine , Bactrim  [sulfamethoxazole -trimethoprim ], Clonidine , Clonidine  derivatives, Lisinopril , Statins, Atorvastatin , Sulfa  antibiotics, Aspirin , and Latex   Review of Systems Review of Systems  Constitutional:  Negative for chills and fever.  Eyes:  Negative for discharge and redness.  Respiratory:  Negative for shortness of breath.   Cardiovascular:  Negative for chest pain.  Gastrointestinal:  Negative for abdominal pain, nausea and vomiting.  Musculoskeletal:  Positive for back pain and myalgias.     Physical Exam Triage Vital Signs ED Triage Vitals  Encounter Vitals Group     BP      Girls Systolic BP Percentile      Girls Diastolic BP Percentile      Boys Systolic BP Percentile      Boys Diastolic BP Percentile      Pulse      Resp      Temp      Temp src      SpO2      Weight      Height      Head Circumference      Peak Flow      Pain Score      Pain Loc      Pain Education      Exclude from Growth Chart    No data found.  Updated Vital Signs BP 122/78 (BP Location: Right Arm)   Pulse 70   Temp 98.1 F (36.7 C) (Oral)   Resp 18   Ht 5' 5 (1.651 m)   Wt 197 lb (89.4 kg)   SpO2 98%   BMI 32.78 kg/m   Visual Acuity Right Eye Distance:   Left Eye Distance:   Bilateral Distance:    Right Eye Near:   Left Eye Near:    Bilateral Near:     Physical Exam Vitals and nursing note reviewed.  Constitutional:      General: She is not in acute distress.    Appearance: Normal appearance. She is not  ill-appearing.  HENT:     Head: Normocephalic and atraumatic.  Eyes:     Conjunctiva/sclera: Conjunctivae normal.  Cardiovascular:     Rate and Rhythm: Normal rate.  Pulmonary:     Effort: Pulmonary effort is normal. No respiratory distress.  Musculoskeletal:     Comments: No tenderness to palpation of midline thoracic or lumbar spine.  Mild tenderness appreciated diffusely to left trapezius  Neurological:     Mental Status: She is alert.  Psychiatric:        Mood and Affect: Mood normal.        Behavior: Behavior normal.        Thought Content: Thought content normal.  UC Treatments / Results  Labs (all labs ordered are listed, but only abnormal results are displayed) Labs Reviewed - No data to display  EKG   Radiology DG Chest 2 View Result Date: 02/01/2024 CLINICAL DATA:  left shoulder pain EXAM: CHEST - 2 VIEW COMPARISON:  None available. FINDINGS: No focal airspace consolidation, pleural effusion, or pneumothorax. No cardiomegaly. Tortuous aorta. No acute fracture or destructive lesion. Multilevel thoracic osteophytosis. IMPRESSION: No acute cardiopulmonary abnormality. Electronically Signed   By: Rogelia Fredna Stricker M.D.   On: 02/01/2024 17:07    Procedures Procedures (including critical care time)  Medications Ordered in UC Medications - No data to display  Initial Impression / Assessment and Plan / UC Course  I have reviewed the triage vital signs and the nursing notes.  Pertinent labs & imaging results that were available during my care of the patient were reviewed by me and considered in my medical decision making (see chart for details).    Discussed most likely muscular injury given location of pain and tenderness on palpation.  Patient requested chest x-ray given history of lung cancer, ordered as requested.  Chest x-ray without abnormal findings.  Will treat with steroid burst and muscle relaxer and advised her to utilize heat and massage as well and  follow-up if symptoms do not improve or worsen.  Patient expressed understanding.  Final Clinical Impressions(s) / UC Diagnoses   Final diagnoses:  Acute pain of left shoulder     Discharge Instructions       Monitor glucose levels as they may be elevated with steroid treatment.      ED Prescriptions     Medication Sig Dispense Auth. Provider   predniSONE  (DELTASONE ) 20 MG tablet Take 2 tablets (40 mg total) by mouth daily with breakfast for 5 days. 10 tablet Billy Stabs F, PA-C   tiZANidine  (ZANAFLEX ) 4 MG tablet Take 0.5-1 tablets (2-4 mg total) by mouth every 6 (six) hours as needed for muscle spasms. 30 tablet Billy Stabs FALCON, PA-C      PDMP not reviewed this encounter.   Billy Stabs FALCON, PA-C 02/01/24 [phone]

## 2024-02-02 ENCOUNTER — Ambulatory Visit (HOSPITAL_COMMUNITY): Payer: Self-pay

## 2024-02-08 ENCOUNTER — Encounter: Payer: Self-pay | Admitting: Family Medicine

## 2024-02-08 ENCOUNTER — Other Ambulatory Visit: Payer: Self-pay | Admitting: Family Medicine

## 2024-02-10 ENCOUNTER — Other Ambulatory Visit: Payer: Self-pay | Admitting: Family Medicine

## 2024-02-28 ENCOUNTER — Encounter: Payer: Self-pay | Admitting: Family Medicine

## 2024-03-01 ENCOUNTER — Encounter: Payer: Self-pay | Admitting: Family Medicine

## 2024-03-01 ENCOUNTER — Ambulatory Visit: Admitting: Family Medicine

## 2024-03-01 VITALS — BP 153/81 | HR 68 | Wt 197.2 lb

## 2024-03-01 DIAGNOSIS — E785 Hyperlipidemia, unspecified: Secondary | ICD-10-CM | POA: Diagnosis not present

## 2024-03-01 DIAGNOSIS — M546 Pain in thoracic spine: Secondary | ICD-10-CM

## 2024-03-01 DIAGNOSIS — G8929 Other chronic pain: Secondary | ICD-10-CM | POA: Diagnosis not present

## 2024-03-01 DIAGNOSIS — E1169 Type 2 diabetes mellitus with other specified complication: Secondary | ICD-10-CM | POA: Diagnosis not present

## 2024-03-01 DIAGNOSIS — Z23 Encounter for immunization: Secondary | ICD-10-CM

## 2024-03-01 DIAGNOSIS — I10 Essential (primary) hypertension: Secondary | ICD-10-CM

## 2024-03-01 MED ORDER — TRAMADOL HCL 50 MG PO TABS: 50.0000 mg | ORAL_TABLET | Freq: Two times a day (BID) | ORAL | 0 refills | Status: AC | PRN

## 2024-03-01 MED ORDER — MOUNJARO 7.5 MG/0.5ML ~~LOC~~ SOAJ: 7.5000 mg | SUBCUTANEOUS | 1 refills | Status: DC

## 2024-03-01 MED ORDER — BACLOFEN 5 MG PO TABS: 5.0000 mg | ORAL_TABLET | Freq: Two times a day (BID) | ORAL | 0 refills | Status: DC | PRN

## 2024-03-01 NOTE — Progress Notes (Signed)
 SUBJECTIVE:   CHIEF COMPLAINT / HPI:   Discussed the use of AI scribe software for clinical note transcription with the patient, who gave verbal consent to proceed.  History of Present Illness   Teresa [name] is a 64 year old female with hypertension and diabetes who presents with back pain and blood pressure management.  She has been experiencing mid upper back pain for over a month, primarily located in the upper back and shoulder area. The pain worsens when she attempts to sit up straighter. She initially mentioned this pain during a previous visit and has been managing it with baclofen , a muscle relaxant she received from urgent care, but has run out of the medication. An x-ray was performed at urgent care, but she is unsure if it was of her back or chest. She has been trying to exercise, focusing on squats and avoiding arm exercises due to pain.  She has a history of hypertension and is currently taking Coreg  25 mg twice daily, hydralazine  100 mg three times daily, and Aldactone  100 mg daily. Her blood pressure readings at home are generally good, with occasional high readings. She has not taken her blood pressure medication today due to schedule disruptions.  She is managing diabetes and has been on Mounjaro  5 mg once a week since January 23, 2024. She reports an episode of low blood sugar after taking Mounjaro , which resolved after retesting. Her morning blood sugar levels have been higher than usual since starting Mounjaro , ranging from 136 to 146 mg/dL, compared to her usual 90 to 102 mg/dL. She mentions feeling 'stuffed all the time' and experiencing constipation, for which she occasionally uses Miralax .  Her current medications also include baby aspirin , Zyrtec  10 mg, Zetia  10 mg, Synthroid  175 mcg, and an Epipen . She uses Refresh eye drops and a cream for her eyes at night. She has not started Tepezza  due to concerns about potential side effects. She takes Prilosec and Miralax  as  needed.       PERTINENT  PMH / PSH: PMhx reviewed  OBJECTIVE:   BP (!) 153/81   Pulse 68   Wt 197 lb 3.2 oz (89.4 kg)   SpO2 96%   BMI 32.82 kg/m   Physical Exam Vitals and nursing note reviewed.  Cardiovascular:     Rate and Rhythm: Normal rate and regular rhythm.     Heart sounds: Normal heart sounds. No murmur heard. Pulmonary:     Effort: Pulmonary effort is normal. No respiratory distress.     Breath sounds: Normal breath sounds. No wheezing or rhonchi.  Musculoskeletal:     Cervical back: Normal.     Thoracic back: Normal.     Lumbar back: Normal.    Results   LABS Blood Glucose: 117 mg/dL ([id number])  RADIOLOGY Chest X-ray: Performed (2024-02-01)       ASSESSMENT/PLAN:   Assessment & Plan  Assessment and Plan    Chronic upper back pain Chronic pain in shoulder and upper back, worsened by posture. Previous baclofen  use completed. Avoids further neck injections. - Order x-ray of the back. If she is able to obtain her Xray report from the UC, she would not need to get repeat Xray (she agreed) - Prescribe baclofen  for muscle relaxation. - Prescribe pain medication. - Instruct to call urgent care to confirm if a back x-ray was done and obtain a copy if available.  Hypertension Blood pressure 146/83, not yet medicated today. Generally controlled with hydralazine , Coreg  and Aldactone , occasional high readings. -  Recheck blood pressure after taking medication. - Order kidney function test. - Schedule follow-up appointment after taking blood pressure medication.  Type 2 diabetes mellitus Blood sugar fluctuating, recent low after Mounjaro . Constipation and fullness noted, possibly Mounjaro -related. Morning sugars slightly elevated. - Increase Mounjaro  to 7.5 mg once a week after current batch. - Encourage eating when taking Mounjaro  to prevent low glucose levels.  Hyperlipidemia Managed with Zetia  and inclisiran.   Constipation Fullness and laxative use  reported, possibly Mounjaro -related. - Recommend Miralax  as needed for constipation.  General Health Maintenance Flu shot given. Pneumonia vaccine current. COVID vaccine discussed. Declined.        Otto Fairly, MD [facility]

## 2024-03-01 NOTE — Assessment & Plan Note (Signed)
 Blood pressure 146/83, not yet medicated today. Generally controlled with hydralazine , Coreg  and Aldactone , occasional high readings. - Recheck blood pressure after taking medication. - Order kidney function test. - Schedule follow-up appointment after taking blood pressure medication.

## 2024-03-01 NOTE — Patient Instructions (Signed)
 Back Exercises These exercises help to make your trunk and back strong. They also help to keep the lower back flexible. Doing these exercises can help to prevent or lessen pain in your lower back. If you have back pain, try to do these exercises 2-3 times each day or as told by your doctor. As you get better, do the exercises once each day. Repeat the exercises more often as told by your doctor. To stop back pain from coming back, do the exercises once each day, or as told by your doctor. Do exercises exactly as told by your doctor. Stop right away if you feel sudden pain or your pain gets worse. Exercises Single knee to chest Do these steps 3-5 times in a row for each leg: Lie on your back on a firm bed or the floor with your legs stretched out. Bring one knee to your chest. Grab your knee or thigh with both hands and hold it in place. Pull on your knee until you feel a gentle stretch in your lower back or butt. Keep doing the stretch for 10-30 seconds. Slowly let go of your leg and straighten it. Pelvic tilt Do these steps 5-10 times in a row: Lie on your back on a firm bed or the floor with your legs stretched out. Bend your knees so they point up to the ceiling. Your feet should be flat on the floor. Tighten your lower belly (abdomen) muscles to press your lower back against the floor. This will make your tailbone point up to the ceiling instead of pointing down to your feet or the floor. Stay in this position for 5-10 seconds while you gently tighten your muscles and breathe evenly. Cat-cow Do these steps until your lower back bends more easily: Get on your hands and knees on a firm bed or the floor. Keep your hands under your shoulders, and keep your knees under your hips. You may put padding under your knees. Let your head hang down toward your chest. Tighten (contract) the muscles in your belly. Point your tailbone toward the floor so your lower back becomes rounded like the back of a  cat. Stay in this position for 5 seconds. Slowly lift your head. Let the muscles of your belly relax. Point your tailbone up toward the ceiling so your back forms a sagging arch like the back of a cow. Stay in this position for 5 seconds.  Press-ups Do these steps 5-10 times in a row: Lie on your belly (face-down) on a firm bed or the floor. Place your hands near your head, about shoulder-width apart. While you keep your back relaxed and keep your hips on the floor, slowly straighten your arms to raise the top half of your body and lift your shoulders. Do not use your back muscles. You may change where you place your hands to make yourself more comfortable. Stay in this position for 5 seconds. Keep your back relaxed. Slowly return to lying flat on the floor.  Bridges Do these steps 10 times in a row: Lie on your back on a firm bed or the floor. Bend your knees so they point up to the ceiling. Your feet should be flat on the floor. Your arms should be flat at your sides, next to your body. Tighten your butt muscles and lift your butt off the floor until your waist is almost as high as your knees. If you do not feel the muscles working in your butt and the back of  your thighs, slide your feet 1-2 inches (2.5-5 cm) farther away from your butt. Stay in this position for 3-5 seconds. Slowly lower your butt to the floor, and let your butt muscles relax. If this exercise is too easy, try doing it with your arms crossed over your chest. Belly crunches Do these steps 5-10 times in a row: Lie on your back on a firm bed or the floor with your legs stretched out. Bend your knees so they point up to the ceiling. Your feet should be flat on the floor. Cross your arms over your chest. Tip your chin a little bit toward your chest, but do not bend your neck. Tighten your belly muscles and slowly raise your chest just enough to lift your shoulder blades a tiny bit off the floor. Avoid raising your body  higher than that because it can put too much stress on your lower back. Slowly lower your chest and your head to the floor. Back lifts Do these steps 5-10 times in a row: Lie on your belly (face-down) with your arms at your sides, and rest your forehead on the floor. Tighten the muscles in your legs and your butt. Slowly lift your chest off the floor while you keep your hips on the floor. Keep the back of your head in line with the curve in your back. Look at the floor while you do this. Stay in this position for 3-5 seconds. Slowly lower your chest and your face to the floor. Contact a doctor if: Your back pain gets a lot worse when you do an exercise. Your back pain does not get better within 2 hours after you exercise. If you have any of these problems, stop doing the exercises. Do not do them again unless your doctor says it is okay. Get help right away if: You have sudden, very bad back pain. If this happens, stop doing the exercises. Do not do them again unless your doctor says it is okay. This information is not intended to replace advice given to you by your health care provider. Make sure you discuss any questions you have with your health care provider. Document Revised: 08/26/2020 Document Reviewed: 08/26/2020 Elsevier Patient Education  2024 [employer].

## 2024-03-01 NOTE — Addendum Note (Signed)
 Addended by: ANDERS CUMINS T on: 03/01/2024 12:44 PM   Modules accepted: Orders

## 2024-03-01 NOTE — Assessment & Plan Note (Signed)
 Managed with Zetia  and inclisiran.

## 2024-03-01 NOTE — Assessment & Plan Note (Signed)
 Blood sugar fluctuating, recent low after Mounjaro . Constipation and fullness noted, possibly Mounjaro -related. Morning sugars slightly elevated. - Increase Mounjaro  to 7.5 mg once a week after current batch. - Encourage eating when taking Mounjaro  to prevent low glucose levels.

## 2024-03-01 NOTE — Addendum Note (Signed)
 Addended by: ANDERS CUMINS T on: 03/01/2024 02:31 PM   Modules accepted: Orders

## 2024-03-01 NOTE — Assessment & Plan Note (Signed)
 Chronic pain in shoulder and upper back, worsened by posture. Previous baclofen  use completed. Avoids further neck injections. - Order x-ray of the back. If she is able to obtain her Xray report from the UC, she would not need to get repeat Xray (she agreed) - Prescribe baclofen  for muscle relaxation. - Prescribe pain medication. - Instruct to call urgent care to confirm if a back x-ray was done and obtain a copy if available.

## 2024-03-02 ENCOUNTER — Ambulatory Visit: Payer: Self-pay | Admitting: Family Medicine

## 2024-03-02 LAB — BASIC METABOLIC PANEL WITH GFR
BUN/Creatinine Ratio: 19 (ref 12–28)
BUN: 23 mg/dL (ref 8–27)
CO2: 22 mmol/L (ref 20–29)
Calcium: 9.9 mg/dL (ref 8.7–10.3)
Chloride: 106 mmol/L (ref 96–106)
Creatinine, Ser: 1.19 mg/dL — ABNORMAL HIGH (ref 0.57–1.00)
Glucose: 89 mg/dL (ref 70–99)
Potassium: 4.8 mmol/L (ref 3.5–5.2)
Sodium: 143 mmol/L (ref 134–144)
eGFR: 52 mL/min/1.73 — ABNORMAL LOW (ref >59)

## 2024-03-02 LAB — LIPID PANEL
Chol/HDL Ratio: 2.6 ratio (ref 0.0–4.4)
Cholesterol, Total: 122 mg/dL (ref 100–199)
HDL: 47 mg/dL (ref >39)
LDL Chol Calc (NIH): 61 mg/dL (ref 0–99)
Triglycerides: 70 mg/dL (ref 0–149)
VLDL Cholesterol Cal: 14 mg/dL (ref 5–40)

## 2024-03-06 ENCOUNTER — Ambulatory Visit (HOSPITAL_COMMUNITY)
Admission: RE | Admit: 2024-03-06 | Discharge: 2024-03-06 | Disposition: A | Discharge status: Home / Self Care | Source: Ambulatory Visit | Attending: Family Medicine | Admitting: Family Medicine

## 2024-03-06 ENCOUNTER — Other Ambulatory Visit: Payer: Self-pay | Admitting: Family Medicine

## 2024-03-06 DIAGNOSIS — M546 Pain in thoracic spine: Secondary | ICD-10-CM | POA: Diagnosis not present

## 2024-03-06 DIAGNOSIS — G8929 Other chronic pain: Secondary | ICD-10-CM | POA: Diagnosis not present

## 2024-03-06 DIAGNOSIS — M549 Dorsalgia, unspecified: Secondary | ICD-10-CM | POA: Diagnosis not present

## 2024-03-06 DIAGNOSIS — M4804 Spinal stenosis, thoracic region: Secondary | ICD-10-CM | POA: Diagnosis not present

## 2024-03-06 DIAGNOSIS — M47814 Spondylosis without myelopathy or radiculopathy, thoracic region: Secondary | ICD-10-CM | POA: Diagnosis not present

## 2024-03-06 DIAGNOSIS — E785 Hyperlipidemia, unspecified: Secondary | ICD-10-CM

## 2024-03-06 DIAGNOSIS — Z23 Encounter for immunization: Secondary | ICD-10-CM

## 2024-03-06 DIAGNOSIS — E1169 Type 2 diabetes mellitus with other specified complication: Secondary | ICD-10-CM

## 2024-03-06 DIAGNOSIS — I10 Essential (primary) hypertension: Secondary | ICD-10-CM

## 2024-03-07 ENCOUNTER — Encounter: Payer: Self-pay | Admitting: Family Medicine

## 2024-03-15 ENCOUNTER — Ambulatory Visit: Payer: Self-pay | Admitting: Family Medicine

## 2024-03-25 ENCOUNTER — Ambulatory Visit: Payer: Self-pay | Admitting: Family Medicine

## 2024-03-25 ENCOUNTER — Encounter: Payer: Self-pay | Admitting: Family Medicine

## 2024-03-25 ENCOUNTER — Ambulatory Visit (INDEPENDENT_AMBULATORY_CARE_PROVIDER_SITE_OTHER): Admitting: Family Medicine

## 2024-03-25 VITALS — BP 133/78 | HR 76 | Temp 98.2°F | Ht 65.0 in | Wt 200.8 lb

## 2024-03-25 DIAGNOSIS — R059 Cough, unspecified: Secondary | ICD-10-CM

## 2024-03-25 LAB — POC SOFIA 2 FLU + SARS ANTIGEN FIA
Influenza A, POC: NEGATIVE
Influenza B, POC: NEGATIVE
SARS Coronavirus 2 Ag: NEGATIVE

## 2024-03-25 MED ORDER — ALBUTEROL SULFATE HFA 108 (90 BASE) MCG/ACT IN AERS: 2.0000 | INHALATION_SPRAY | RESPIRATORY_TRACT | 2 refills | Status: DC | PRN

## 2024-03-25 NOTE — Progress Notes (Signed)
    SUBJECTIVE:   CHIEF COMPLAINT / HPI:   Dry Cough  Patient reports cough since last night. Feels like it is a dry cough and feels it mainly in her throat. She also noticed some wheezing and crackles.  Denies fevers. Denies shortness of breath. She also endorses a mild frontal headache since yesterday as well. No congestion.  She is not on any inhalers. Has not taken any medication for this.  She denies wheezing at baseline.  She works as a Engineer, structural for an elderly lady and wanted to make sure that she was not giving her a dangerous virus.   PERTINENT  PMH / PSH: OSA, GERD, H/o Tobacco use, Emphysema (evidence on CT)   OBJECTIVE:   BP 133/78   Pulse 76   Temp 98.2 F (36.8 C) (Oral)   Ht 5' 5 (1.651 m)   Wt 200 lb 12.8 oz (91.1 kg)   SpO2 99%   BMI 33.41 kg/m   General: well appearing, in no acute distress  HEENT: No conjunctivitis, no rhinorrhea, mild relief of headache on palpating frontal and maxillary sinuses CV: RRR, no m/r/g, cap refill < 2 seconds  Resp: Normal work of breathing on room air, CTAB, prolonged expiratory phase, no crackles, rhonci, or wheezes, some wheezing with coughing during exam    ASSESSMENT/PLAN:   Assessment & Plan Cough in adult Patient's acute cough most likely related to a virus. Does not have signs of pneumonia on exam. Patient most likely has COPD emphysematous type based on recent lung CT, not proven by PFTs. Unlikely exacerbation given no increased sputum, dyspnea.  - COVID/Flu test  - Could benefit from albuterol  inhaler during this viral infection given patient is most likely type A COPD. Prescribed albuterol  as needed for cough.  - Pharmacy referral for PFTs after patient recovers from URI. - Patient given return precautions for dyspnea, fevers, worsening cough.     Areta Saliva, MD [facility]

## 2024-03-25 NOTE — Patient Instructions (Signed)
 It was wonderful to see you today.  Please bring ALL of your medications with you to every visit.   Today we talked about:  Cough - You most likely have a virus causing an upper respiratory infection. Your exam is reassuring that you do not have pneumonia. You had evidence of something called COPD (chronic obstructive pulmonary disease) on your lung CT. This can make you have more cough and feel more sick with viruses. I am prescribing an inhaler that may help with your cough while you are sick. I have placed a referral for our pharmacist in clinic who can do breathing testing to test and see if you have COPD and the extent of it.   Please follow up if you start to have trouble breathing, you start to have fevers, or cough worsens.   Thank you for choosing [facility].   Please call [phone] with any questions about today's appointment.  Areta Saliva, MD  Family Medicine

## 2024-03-29 ENCOUNTER — Encounter: Payer: Self-pay | Admitting: Family Medicine

## 2024-03-29 ENCOUNTER — Ambulatory Visit: Admitting: Family Medicine

## 2024-03-29 VITALS — BP 135/85 | HR 78 | Ht 65.0 in | Wt 198.2 lb

## 2024-03-29 DIAGNOSIS — Z87891 Personal history of nicotine dependence: Secondary | ICD-10-CM | POA: Diagnosis not present

## 2024-03-29 DIAGNOSIS — I10 Essential (primary) hypertension: Secondary | ICD-10-CM

## 2024-03-29 DIAGNOSIS — G8929 Other chronic pain: Secondary | ICD-10-CM | POA: Diagnosis not present

## 2024-03-29 DIAGNOSIS — E785 Hyperlipidemia, unspecified: Secondary | ICD-10-CM

## 2024-03-29 DIAGNOSIS — R059 Cough, unspecified: Secondary | ICD-10-CM

## 2024-03-29 DIAGNOSIS — E1169 Type 2 diabetes mellitus with other specified complication: Secondary | ICD-10-CM | POA: Diagnosis not present

## 2024-03-29 DIAGNOSIS — M25512 Pain in left shoulder: Secondary | ICD-10-CM | POA: Diagnosis not present

## 2024-03-29 MED ORDER — PROMETHAZINE-DM 6.25-15 MG/5ML PO SYRP
2.5000 mL | ORAL_SOLUTION | Freq: Four times a day (QID) | ORAL | Status: DC | PRN
Start: 2024-03-29 — End: 2024-03-29

## 2024-03-29 MED ORDER — BACLOFEN 10 MG PO TABS: 10.0000 mg | ORAL_TABLET | Freq: Two times a day (BID) | ORAL | Status: AC | PRN

## 2024-03-29 MED ORDER — DICLOFENAC SODIUM 1 % EX GEL: 2.0000 g | Freq: Four times a day (QID) | CUTANEOUS | 1 refills | Status: DC | PRN

## 2024-03-29 NOTE — Assessment & Plan Note (Addendum)
 Home blood pressure readings normal. Current medications include carvedilol  and hydralazine . - Continue current antihypertensive medications. - Monitor blood pressure at home regularly.

## 2024-03-29 NOTE — Assessment & Plan Note (Addendum)
 Hyperlipidemia on PCSK9 inhibitor therapy On Inclisiran injections every six months. Previous statin and Zetia  intolerance. - Continue Inclisiran injections every six months.

## 2024-03-29 NOTE — Progress Notes (Signed)
 SUBJECTIVE:   CHIEF COMPLAINT / HPI:   Discussed the use of AI scribe software for clinical note transcription with the patient, who gave verbal consent to proceed.  History of Present Illness   Teresa [name] is a 63 year old female with borderline COPD who presents with a persistent cough.  She has been experiencing a persistent cough since Sunday, less than seven days ago. The cough is sometimes productive, bringing up phlegm when she coughs hard, and is accompanied by a sensation of something being 'stuck' in her throat along with a 'rattling' or 'gurgling' noise. No fever is present, and there is slight improvement in the cough. Over-the-counter remedies have not been effective.  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 She has a history of being prescribed albuterol  in the past and was recently started on it again as needed since Monday. She recalls a previous test indicating 'borderline COPD' and mentions a past discussion about pulmonary function tests (PFTs) which were not completed. She is currently using albuterol  as needed.  Her current medications include baclofen  5 mg twice daily as needed which is not as effective as her 10 mg dose which she was previously on. She uses Baclofen  in the evening to avoid drowsiness at work. She reports that her left shoulder seems to relax now, so she is not using baclofen  as much as she was about a week or two ago. She also takes carvedilol  25 mg daily, Zyrtec  10 mg daily, and hydralazine  100 mg three times daily for blood pressure management. She has not received her Voltaren  gel prescription for shoulder pain.  She is on a cholesterol management regimen with Inclisiran injections every six months, having transitioned from Zetia  due to adverse reactions to statins. She also takes Synthroid  175 mcg daily, spironolactone  100 mg daily, and Mounjaro  7.5 mg weekly for diabetes  management. Mounjaro  helps maintain her blood sugar levels but she is concerned about weight gain and associated leg pain.  She attended a recent social event, a 'sneaker ball', which she enjoyed but suspects may have contributed to her current illness. She is concerned about her nephew and his family, who are facing homelessness, and the stress this situation causes her.       PERTINENT  PMH / PSH: PMHx reviewed  OBJECTIVE:   BP 135/85   Pulse 78   Ht 5' 5 (1.651 m)   Wt 198 lb 3.2 oz (89.9 kg)   SpO2 97%   BMI 32.98 kg/m   Physical Exam   VITALS: P- 78, BP- 135/85, SaO2- 97% HEENT: Throat without erythema or discharge. CHEST: Lungs clear to auscultation, no wheezing. CARDIOVASCULAR: Heart regular rate and rhythm, no murmurs. EXTREMITIES: Extremities with good pulses, clean toes. NEUROLOGICAL: Sensation intact bilaterally on feet.       ASSESSMENT/PLAN:   Assessment & Plan Cough, unspecified type Acute cough, likely viral etiology Cough likely viral with negative COVID and flu tests, no fever, and no signs of bacterial infection. Sinus infection unlikely. - Advise honey for cough relief. - Continue albuterol  as needed. - Wear face mask if visiting hospital or working with vulnerable individuals. - Avoid hospital visits to prevent infection spread. - She has promethazine  DM at home which helped better in the past for cough - May use this sparingly for a short period due to S/E which I discussed with her History of tobacco use Possible chronic obstructive pulmonary disease (  COPD), under evaluation Borderline COPD diagnosis with incomplete PFTs. Albuterol  prescribed for symptoms during her last visit. - Refer to Dr. Koval for pulmonary function tests (PFTs). Primary hypertension Home blood pressure readings normal. Current medications include carvedilol  and hydralazine . - Continue current antihypertensive medications. - Monitor blood pressure at home regularly. Type 2  diabetes mellitus with other specified complication, without long-term current use of insulin (HCC) On Mounjaro  7.5 mg weekly. Blood sugar believed controlled, no significant weight loss. - Call pharmacy to refill Mounjaro  prescription. - Increase Mounjaro  dose after current supply is finished. Hyperlipidemia, unspecified hyperlipidemia type Hyperlipidemia on PCSK9 inhibitor therapy On Inclisiran injections every six months. Previous statin and Zetia  intolerance. - Continue Inclisiran injections every six months. Chronic left shoulder pain Left shoulder pain, intermittent Pain improved, less frequent baclofen  use. Better relief with 10 mg dose. - Increase baclofen  to 10 mg as needed. - Use current 5 mg tablets by taking two to achieve 10 mg dose. - Resend Voltaren  gel prescription to pharmacy.     Otto Fairly, MD [facility]

## 2024-03-29 NOTE — Assessment & Plan Note (Addendum)
 Left shoulder pain, intermittent Pain improved, less frequent baclofen  use. Better relief with 10 mg dose. - Increase baclofen  to 10 mg as needed. - Use current 5 mg tablets by taking two to achieve 10 mg dose. - Resend Voltaren  gel prescription to pharmacy.

## 2024-03-29 NOTE — Patient Instructions (Signed)

## 2024-03-29 NOTE — Progress Notes (Signed)
 Teresa [name]                                          MRN: [id number]   03/29/2024   The [facility]I Quality Team Specialist reviewed this patient medical record for the purposes of chart review for care gap closure. The following were reviewed: abstraction for care gap closure-kidney health evaluation for diabetes:eGFR  and uACR.    VBCI Quality Team

## 2024-03-29 NOTE — Assessment & Plan Note (Addendum)
 On Mounjaro  7.5 mg weekly. Blood sugar believed controlled, no significant weight loss. - Call pharmacy to refill Mounjaro  prescription. - Increase Mounjaro  dose after current supply is finished.

## 2024-04-01 ENCOUNTER — Telehealth: Payer: Self-pay | Admitting: Pharmacist

## 2024-04-01 NOTE — Telephone Encounter (Signed)
 Patient contacted for follow-up of request for PFT  Patient scheduled for patient visit 10/7  Total time with patient call and documentation of interaction: 4 minutes.

## 2024-04-02 ENCOUNTER — Ambulatory Visit (INDEPENDENT_AMBULATORY_CARE_PROVIDER_SITE_OTHER): Admitting: Pharmacist

## 2024-04-02 ENCOUNTER — Encounter: Payer: Self-pay | Admitting: Pharmacist

## 2024-04-02 VITALS — BP 117/69 | HR 73 | Ht 65.0 in | Wt 201.6 lb

## 2024-04-02 DIAGNOSIS — R052 Subacute cough: Secondary | ICD-10-CM | POA: Diagnosis not present

## 2024-04-02 MED ORDER — DICLOFENAC SODIUM 1 % EX GEL: 2.0000 g | Freq: Four times a day (QID) | CUTANEOUS | 1 refills | Status: AC | PRN

## 2024-04-02 NOTE — Progress Notes (Signed)
   S:    Chief Complaint  Patient presents with   Medication Management    PFT   63 y.o. female who presents for diabetes evaluation, education, and management. Patient arrives in good spirits and presents without any assistance.   Patient was referred and last seen by Primary Care Provider, Dr. Anders, on 03/29/24. At last visit, there was concern of undiagnosed potential COPD due to 40 year history of smoking with no history of PFTs. Referred for PFT evaluation.  PMH is significant for T2DM, hypertension, hyperlipidemia, hx of tobacco use, GERD, CKD.   Patient reports breathing has been fine but has been experiencing cough.   O: Review of Systems  Respiratory:  Positive for cough. Negative for sputum production and shortness of breath.   All other systems reviewed and are negative.  Physical Exam Vitals reviewed.  Constitutional:      Appearance: Normal appearance.  Pulmonary:     Effort: Pulmonary effort is normal.  Neurological:     Mental Status: She is alert.  Psychiatric:        Mood and Affect: Mood normal.        Behavior: Behavior normal.        Thought Content: Thought content normal.        Judgment: Judgment normal.    Vitals:   04/02/24 1456  BP: 117/69  Pulse: 73  SpO2: 98%   A/P: Patient has been experiencing cough for the past month, denies dyspnea, sputum production. Spirometry evaluation reveals normal lung function.  Lingering cough is likely linked to viral infection. Advised patient to continue using cough drops and honey to manage cough. Written patient instructions provided.    Total time in face to face counseling 34 minutes.    Follow-up:  Pharmacist not scheduled but can schedule as needed PCP clinic visit 05/13/2024 Patient seen with Lawson Mao, PharmD Candidate - PY3 student and Aidan Drkulec, PharmD - PY4 Candidate.

## 2024-04-02 NOTE — Patient Instructions (Addendum)
 It was nice to see you today! Your pulmonary function tests are normal for your age and height. Continue using cough drops and honey for your cough.

## 2024-04-02 NOTE — Telephone Encounter (Signed)
 Reviewed and agree with Dr Rennis plan.

## 2024-04-02 NOTE — Assessment & Plan Note (Addendum)
 Patient has been experiencing cough for the past month, denies dyspnea, sputum production. Spirometry evaluation reveals normal lung function.  Lingering cough is likely linked to viral infection. Advised patient to continue using cough drops and honey to manage cough. Written patient instructions provided.

## 2024-04-03 NOTE — Progress Notes (Signed)
 Reviewed and agree with Dr Rennis plan.

## 2024-04-04 NOTE — Progress Notes (Signed)
 Teresa [name]                                          MRN: [id number]   04/04/2024   The [facility]BCI Quality Team Specialist reviewed this patient medical record for the purposes of chart review for care gap closure. The following were reviewed: abstraction for care gap closure-controlling blood pressure.    VBCI Quality Team

## 2024-04-16 ENCOUNTER — Encounter: Payer: Self-pay | Admitting: Family Medicine

## 2024-04-16 DIAGNOSIS — T466X5A Adverse effect of antihyperlipidemic and antiarteriosclerotic drugs, initial encounter: Secondary | ICD-10-CM | POA: Insufficient documentation

## 2024-04-18 ENCOUNTER — Encounter: Payer: Self-pay | Admitting: Family Medicine

## 2024-04-22 ENCOUNTER — Other Ambulatory Visit: Payer: Self-pay | Admitting: Family Medicine

## 2024-04-22 MED ORDER — TIRZEPATIDE 10 MG/0.5ML ~~LOC~~ SOAJ: 10.0000 mg | SUBCUTANEOUS | 1 refills | Status: DC

## 2024-04-25 ENCOUNTER — Telehealth: Payer: Self-pay

## 2024-04-25 NOTE — Telephone Encounter (Signed)
 Auth Submission: APPROVED - auto renewal Site of care: Site of care: [facility] Payer: [employer] medicare Medication & CPT/J Code(s) submitted: Leqvio  (Inclisiran) J1306 Diagnosis Code:  Route of submission (phone, fax, portal): auto renewal Phone # Fax # Auth type: Buy/Bill PB Units/visits requested: 284mg  x 2 doses Reference number: [id number] Approval from: 06/27/24 to 06/26/25   Renewal approval letter is in the media tab

## 2024-04-25 NOTE — Telephone Encounter (Signed)
 Spoke with patient. She stated it was her leg that was swollen and sore, it has not got any worse. I made her appt for tomorrow at 1pm. Jahbari Repinski, CMA

## 2024-04-26 ENCOUNTER — Ambulatory Visit: Admitting: Family Medicine

## 2024-04-26 ENCOUNTER — Encounter: Payer: Self-pay | Admitting: Family Medicine

## 2024-05-02 ENCOUNTER — Encounter: Payer: Self-pay | Admitting: Family Medicine

## 2024-05-10 ENCOUNTER — Encounter: Payer: Self-pay | Admitting: Family Medicine

## 2024-05-10 ENCOUNTER — Other Ambulatory Visit: Payer: Self-pay | Admitting: Family Medicine

## 2024-05-13 ENCOUNTER — Encounter: Payer: Self-pay | Admitting: Family Medicine

## 2024-05-13 ENCOUNTER — Ambulatory Visit: Admitting: Family Medicine

## 2024-05-13 VITALS — BP 145/85 | HR 65 | Temp 98.3°F | Ht 65.0 in | Wt 200.6 lb

## 2024-05-13 DIAGNOSIS — R053 Chronic cough: Secondary | ICD-10-CM | POA: Diagnosis not present

## 2024-05-13 DIAGNOSIS — Z6832 Body mass index (BMI) 32.0-32.9, adult: Secondary | ICD-10-CM | POA: Diagnosis not present

## 2024-05-13 DIAGNOSIS — I10 Essential (primary) hypertension: Secondary | ICD-10-CM

## 2024-05-13 DIAGNOSIS — R059 Cough, unspecified: Secondary | ICD-10-CM

## 2024-05-13 DIAGNOSIS — Z77120 Contact with and (suspected) exposure to mold (toxic): Secondary | ICD-10-CM

## 2024-05-13 DIAGNOSIS — E1169 Type 2 diabetes mellitus with other specified complication: Secondary | ICD-10-CM | POA: Diagnosis not present

## 2024-05-13 LAB — POC COVID19/FLU A&B COMBO
Covid Antigen, POC: NEGATIVE
Influenza A Antigen, POC: NEGATIVE
Influenza B Antigen, POC: NEGATIVE

## 2024-05-13 MED ORDER — MOMETASONE FUROATE 50 MCG/ACT NA SUSP: 2.0000 | Freq: Every day | NASAL | 2 refills | Status: DC

## 2024-05-13 MED ORDER — AMOXICILLIN-POT CLAVULANATE 875-125 MG PO TABS: 1.0000 | ORAL_TABLET | Freq: Two times a day (BID) | ORAL | 0 refills | Status: AC

## 2024-05-13 NOTE — Assessment & Plan Note (Addendum)
 Weight 200 pounds, difficulty with weight management, Mounjaro  causing a mild reaction. - Continue Mounjaro  10 mg injection. - Monitor for adverse reactions.

## 2024-05-13 NOTE — Progress Notes (Signed)
 SUBJECTIVE:   CHIEF COMPLAINT / HPI:   Discussed the use of AI scribe software for clinical note transcription with the patient, who gave verbal consent to proceed.  History of Present Illness   Teresa [name] is a 63 year old female presenting with a persistent cough for 6 weeks.  She has been experiencing a persistent cough for approximately six to seven weeks, which is more pronounced in the morning due to mucus accumulation. The mucus is occasionally yellow, but there is no hemoptysis. Recently, she has noticed slight chest discomfort. She has been using promethazine  DM and Mucinex  without relief.  She has a history of hypertension with blood pressure readings at home typically around 127/83 mmHg, but occasionally as high as 140/91 mmHg. She is currently taking carvedilol  25 mg twice daily, hydralazine  100 mg three times daily, and spironolactone  100 mg daily. She did not take her morning dose of carvedilol  on the day of the visit.  She is on Mounjaro  10 mg for diabetes management, which causes swelling at the injection site and nausea. Her weight has increased to 200 pounds, and she feels her stomach is enlarging despite efforts to manage her sugar levels. She previously experienced similar injection site reactions with a lower dose of 7.5 mg but not with 5 mg. She feels the injection site reaction is minimal and lasts only a few days, and then it resolves.  Her medication regimen includes Tylenol  and albuterol  as needed, with albuterol  causing headaches, leading to infrequent use. She also takes Zyrtec  10 mg daily, Synthroid  175 mcg daily, and baby aspirin  once daily. She has not taken her inclisiran injection recently, with the next dose scheduled for January.  She is concerned about mold exposure in her home, which her housing management has not addressed despite ongoing issues for 2 years. She is considering legal action due to the persistent mold problem. She worries this is related  to her cough and would like to get tested for this. She endorses postnasal drip with throat irritation.   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 HEENT: Throat normal. CHEST: Lungs clear to auscultation, no crackles. CARDIOVASCULAR: Heart regular rate and rhythm, no murmurs.       ASSESSMENT/PLAN:   Assessment & Plan Chronic cough Chronic cough and suspected mold exposure vs Chronic sinusitis (Chronic cough syndrome) associated with postnasal drip irritation Chronic cough for six weeks, possible mold exposure, postnasal drip, or sinus issues. Normal chest x-ray and CT, mild COPD noted, normal PFTs. - Ordered chest x-ray. - Ordered serum IgE test for mold. - Prescribed antibiotic > Augmentin  - Add Nasonex  to her regimen - Referred to allergist if symptoms persist> she agrees - Performed COVID and flu testing> both were negative, and the patient is aware of her test result via MyChart Mold exposure See above Primary hypertension  Type 2 diabetes mellitus with other specified complication, without long-term current use of insulin (HCC) Blood sugar well-controlled, A1c 5.7 in July. - Continue current diabetes management regimen. - Schedule A1c test  in December. - Consider tapering off Mounjaro  if she continues to have injection site irritation - She prefers to continue therapy for now BMI 32.0-32.9,adult Weight 200 pounds, difficulty with weight management, Mounjaro  causing a mild reaction. - Continue Mounjaro  10 mg injection. - Monitor for adverse reactions.     Otto Fairly, MD [facility]

## 2024-05-13 NOTE — Patient Instructions (Signed)
 Chronic Cough Coughing is a reflex that clears your throat and airways (respiratory system). It helps heal and protect your lungs. It is normal to cough from time to time. A cough that happens with other symptoms or that lasts a long time may be a sign of a condition that needs treatment. A long-term (chronic) cough may last 8 or more weeks. There are two types of chronic cough: A symptomatic chronic cough. This is caused by a disease that can be found and treated. A refractory chronic cough. This is a cough that does not go away with testing and treatment. A chronic cough may be caused by: Long-term lung diseases. These include chronic obstructive pulmonary disease (COPD), asthma, and pulmonary fibrosis. Upper airway problems. These include allergies, sinusitis, and gastric reflux. Some medicines. Smoking. Follow these instructions at home: Medicines Take over-the-counter and prescription medicines only as told by your health care provider. Ask your provider about getting a flu (influenza) or pneumonia vaccine. Managing a sore or dry throat If your throat is sore or dry, gargle with a mixture of salt and water 3-4 times a day or as needed. To make salt water, completely dissolve -1 tsp (3-6 g) of salt in 1 cup (237 mL) of warm water. Soothe your throat with a cough drop or honey. A dry throat may make your cough worse. Use a cool mist vaporizer at home to add moisture to the air. Lifestyle Avoid cigarette smoke. Do not use any products that contain nicotine or tobacco. These products include cigarettes, chewing tobacco, and vaping devices, such as e-cigarettes. If you need help quitting, ask your provider. Avoid things that may irritate your throat or trigger your allergies. General instructions  Drink enough fluid to keep your pee (urine) pale yellow. Always cover your mouth when you cough. Stay away from people who are sick. Getting a cold or the flu can make your cough worse. Wash your  hands often with soap and water for at least 20 seconds. If soap and water are not available, use hand sanitizer. Contact a health care provider if: Your cough gets worse. You have a fever or chills. You are short of breath. Get help right away if: You have trouble breathing. You have chest pain. These symptoms may be an emergency. Get help right away. Call 911. Do not wait to see if the symptoms will go away. Do not drive yourself to the hospital. This information is not intended to replace advice given to you by your health care provider. Make sure you discuss any questions you have with your health care provider. Document Revised: 07/07/2022 Document Reviewed: 02/24/2022 Elsevier Patient Education  2024 [employer].

## 2024-05-13 NOTE — Assessment & Plan Note (Addendum)
 Blood sugar well-controlled, A1c 5.7 in July. - Continue current diabetes management regimen. - Schedule A1c test in December. - Consider tapering off Mounjaro  if she continues to have injection site irritation - She prefers to continue therapy for now

## 2024-05-13 NOTE — Assessment & Plan Note (Signed)
 Chronic cough and suspected mold exposure vs Chronic sinusitis (Chronic cough syndrome) associated with postnasal drip irritation Chronic cough for six weeks, possible mold exposure, postnasal drip, or sinus issues. Normal chest x-ray and CT, mild COPD noted, normal PFTs. - Ordered chest x-ray. - Ordered serum IgE test for mold. - Prescribed antibiotic > Augmentin  - Add Nasonex  to her regimen - Referred to allergist if symptoms persist> she agrees - Performed COVID and flu testing> both were negative, and the patient is aware of her test result via MyChart

## 2024-05-17 ENCOUNTER — Other Ambulatory Visit

## 2024-05-17 ENCOUNTER — Ambulatory Visit (HOSPITAL_COMMUNITY)
Admission: RE | Admit: 2024-05-17 | Discharge: 2024-05-17 | Disposition: A | Discharge status: Home / Self Care | Source: Ambulatory Visit | Attending: Family Medicine | Admitting: Family Medicine

## 2024-05-17 ENCOUNTER — Ambulatory Visit: Payer: Self-pay | Admitting: Family Medicine

## 2024-05-17 DIAGNOSIS — Z77120 Contact with and (suspected) exposure to mold (toxic): Secondary | ICD-10-CM

## 2024-05-17 DIAGNOSIS — R053 Chronic cough: Secondary | ICD-10-CM | POA: Diagnosis not present

## 2024-05-17 LAB — F082-IGE CHEESE, MOLD TYPE: F082-IgE Cheese, Mold Type: 0.36 kU/L — AB

## 2024-05-17 NOTE — Telephone Encounter (Signed)
 IgE result equivocal. Unfortunately, this is only Cheese mold. I advised patient I can refer her to an allergist or return for other IgE testing. She prefers to come in for lab work. Lab ordered F/U for chest xray as planned. Complete A/B therapy and consider CT chest in the future. She agreed with the plan.

## 2024-05-20 ENCOUNTER — Encounter: Payer: Self-pay | Admitting: Family Medicine

## 2024-05-21 ENCOUNTER — Ambulatory Visit: Payer: Self-pay | Admitting: Family Medicine

## 2024-05-21 LAB — ALLERGEN PROFILE, MOLD
Alternaria Alternata IgE: <0.1 kU/L
Aspergillus Fumigatus IgE: <0.1 kU/L
Aureobasidi Pullulans IgE: <0.1 kU/L
Candida Albicans IgE: <0.1 kU/L
Cladosporium Herbarum IgE: <0.1 kU/L
M009-IgE Fusarium proliferatum: <0.1 kU/L
M014-IgE Epicoccum purpur: <0.1 kU/L
Mucor Racemosus IgE: <0.1 kU/L
Penicillium Chrysogen IgE: <0.1 kU/L
Phoma Betae IgE: <0.1 kU/L
Setomelanomma Rostrat: <0.1 kU/L
Stemphylium Herbarum IgE: <0.1 kU/L

## 2024-05-22 NOTE — Telephone Encounter (Addendum)
 I had a lengthy conversation with the patient regarding her mold allergen test result.  Cheese mold low+, which could indicate previous exposure rather than disease. Chest X-ray official read pending; the image appears clear. F/U final read.  She completed A/B therapy with minimal improvement. She declined oral or nasal steroids due to prior intolerance with this agent. She will consider ENT/Allergist referral in the future if no improvement, but not now. Consider CT chest if chest X-ray is non-revealing. F/U Dec 5th as scheduled. She agreed with the plan and she verbalized understanding.

## 2024-05-27 ENCOUNTER — Encounter: Payer: Self-pay | Admitting: Family Medicine

## 2024-05-28 ENCOUNTER — Other Ambulatory Visit: Payer: Self-pay | Admitting: Family Medicine

## 2024-05-28 MED ORDER — PREDNISONE 20 MG PO TABS: 20.0000 mg | ORAL_TABLET | Freq: Every day | ORAL | 0 refills | Status: AC

## 2024-05-31 ENCOUNTER — Encounter: Payer: Self-pay | Admitting: Family Medicine

## 2024-05-31 ENCOUNTER — Ambulatory Visit: Admitting: Family Medicine

## 2024-05-31 VITALS — BP 138/83 | HR 67 | Ht 65.0 in | Wt 207.4 lb

## 2024-05-31 DIAGNOSIS — T8090XD Unspecified complication following infusion and therapeutic injection, subsequent encounter: Secondary | ICD-10-CM

## 2024-05-31 DIAGNOSIS — E119 Type 2 diabetes mellitus without complications: Secondary | ICD-10-CM | POA: Diagnosis not present

## 2024-05-31 DIAGNOSIS — R052 Subacute cough: Secondary | ICD-10-CM

## 2024-05-31 DIAGNOSIS — T8090XA Unspecified complication following infusion and therapeutic injection, initial encounter: Secondary | ICD-10-CM | POA: Insufficient documentation

## 2024-05-31 DIAGNOSIS — E669 Obesity, unspecified: Secondary | ICD-10-CM | POA: Diagnosis not present

## 2024-05-31 MED ORDER — OZEMPIC (0.25 OR 0.5 MG/DOSE) 2 MG/3ML ~~LOC~~ SOPN: 0.5000 mg | PEN_INJECTOR | SUBCUTANEOUS | 0 refills | Status: DC

## 2024-05-31 NOTE — Assessment & Plan Note (Addendum)
 Cough persists despite negative workup; likely related to allergic rhinitis or asthma. - Prescribed cetirizine  for allergy management. - Prescribed Singulair for nighttime use to manage allergy and potential asthma symptoms. - Will consider CT scan of the lungs if symptoms persist.

## 2024-05-31 NOTE — Assessment & Plan Note (Addendum)
 Adverse reaction to Mounjaro  injections (right thigh swelling, nodular mass, hyperpigmentation) Persistent adverse reactions to Mounjaro  10 mg weekly necessitated transition to Ozempic . - Transitioned from Mounjaro  to Ozempic  0.5 mg weekly. - Instructed to bring the remaining Mounjaro  to the next appointment for pharmacy disposal.

## 2024-05-31 NOTE — Progress Notes (Signed)
    SUBJECTIVE:   CHIEF COMPLAINT / HPI:   Discussed the use of AI scribe software for clinical note transcription with the patient, who gave verbal consent to proceed.  History of Present Illness   Teresa [name] is a 63 year old female who presents with a persistent cough and swelling in the right thigh.  She has a persistent cough that has improved somewhat; she coughs less often than before. Exposure to air exacerbates the cough, which is primarily felt in the throat area but can cause chest pain when severe. She completed antibiotics for the cough, and previous tests, including mold allergy testing, chest x-ray, COVID test, and flu test, were negative. She uses Zyrtec  and occasionally albuterol , particularly at night, but not frequently. She describes the cough as sometimes resembling a 'smoker's cough'.  She reports swelling in her right thigh following Mounjaro  injections, accompanied by pain and itchiness. The swelling was significant enough to cause discomfort and moved from the injection site to other areas. It is mostly itchy, but this time it was painful. She completed course of prednisone  treatment for this.  Her blood pressure has been slightly elevated recently, possibly due to prednisone  use. Recent home blood pressure readings have been higher than usual, but she did not bring her blood pressure monitor to the visit. She also reports a weight increase from 200 pounds to 207 pounds.  She is currently taking Zyrtec  and has been prescribed Singulair for nighttime use.       PERTINENT  PMH / PSH: PMHx reviewed  OBJECTIVE:   BP 138/83   Pulse 67   Ht 5' 5 (1.651 m)   Wt 207 lb 6.4 oz (94.1 kg)   SpO2 98%   BMI 34.51 kg/m   Physical Exam   VITALS: P- 67, BP- 138/83, SaO2- 98% MEASUREMENTS: Weight- 207. CHEST: Lungs clear to auscultation, no wheezing, no crackles. CARDIOVASCULAR: Heart regular rate and rhythm, no murmurs. EXTREMITIES: Mild hyperpigmentation on the right  thigh with a 2x3 cm flat nodular mass at the Mounjaro  injection site. No erythema or tenderness     Chaperone -Nelson Olives  ASSESSMENT/PLAN:   Assessment & Plan Type 2 diabetes mellitus in patient with obesity (HCC) Management complicated by adverse reaction to Mounjaro ; transition to Ozempic  planned. - Transitioned from Mounjaro  to Ozempic  for diabetes management. - Ordered urine protein test to assess kidney function. Subacute cough Cough persists despite negative workup; likely related to allergic rhinitis or asthma. - Prescribed cetirizine  for allergy management. - Prescribed Singulair for nighttime use to manage allergy and potential asthma symptoms. - Will consider CT scan of the lungs if symptoms persist. Injection site reaction, subsequent encounter Adverse reaction to Mounjaro  injections (right thigh swelling, nodular mass, hyperpigmentation) Persistent adverse reactions to Mounjaro  10 mg weekly necessitated transition to Ozempic . - Transitioned from Mounjaro  to Ozempic  0.5 mg weekly. - Instructed to bring the remaining Mounjaro  to the next appointment for pharmacy disposal.     Otto Fairly, MD [facility]

## 2024-05-31 NOTE — Assessment & Plan Note (Deleted)
 Management complicated by adverse reaction to Mounjaro ; transition to Ozempic  planned. - Transitioned from Mounjaro  to Ozempic  for diabetes management. - Ordered urine protein test to assess kidney function.

## 2024-05-31 NOTE — Patient Instructions (Signed)

## 2024-06-01 LAB — MICROALBUMIN / CREATININE URINE RATIO
Creatinine, Urine: 36.2 mg/dL
Microalb/Creat Ratio: <8 mg/g{creat} (ref 0–29)
Microalbumin, Urine: <3 ug/mL

## 2024-06-01 MED ORDER — MONTELUKAST SODIUM 10 MG PO TABS: 10.0000 mg | ORAL_TABLET | Freq: Every day | ORAL | 3 refills | Status: DC

## 2024-06-01 NOTE — Addendum Note (Signed)
 Addended by: ANDERS CUMINS T on: 06/01/2024 09:57 AM   Modules accepted: Orders

## 2024-06-03 ENCOUNTER — Ambulatory Visit: Payer: Self-pay | Admitting: Family Medicine

## 2024-06-07 ENCOUNTER — Encounter: Payer: Self-pay | Admitting: Family Medicine

## 2024-06-07 ENCOUNTER — Other Ambulatory Visit: Payer: Self-pay | Admitting: Family Medicine

## 2024-06-07 DIAGNOSIS — R053 Chronic cough: Secondary | ICD-10-CM

## 2024-06-10 NOTE — Progress Notes (Signed)
 Spoke with patient. Informed of CT Scan at [facility] Dec 31st at 9:15. No restrictions for this imaging. Patient understood. Nelson Land, CMA

## 2024-06-21 ENCOUNTER — Other Ambulatory Visit: Payer: Self-pay | Admitting: Family Medicine

## 2024-06-21 MED ORDER — SEMAGLUTIDE (1 MG/DOSE) 4 MG/3ML ~~LOC~~ SOPN: 1.0000 mg | PEN_INJECTOR | SUBCUTANEOUS | 1 refills | Status: AC

## 2024-06-26 ENCOUNTER — Ambulatory Visit (HOSPITAL_COMMUNITY)
Admission: RE | Admit: 2024-06-26 | Discharge: 2024-06-26 | Disposition: A | Discharge status: Home / Self Care | Source: Ambulatory Visit | Attending: Family Medicine | Admitting: Family Medicine

## 2024-06-26 DIAGNOSIS — R053 Chronic cough: Secondary | ICD-10-CM | POA: Insufficient documentation

## 2024-06-26 MED ORDER — IOHEXOL 300 MG/ML  SOLN
75.0000 mL | Freq: Once | INTRAMUSCULAR | Status: AC | PRN
  Administered 2024-06-26: 75 mL via INTRAVENOUS

## 2024-06-28 ENCOUNTER — Ambulatory Visit: Payer: Self-pay | Admitting: Family Medicine

## 2024-06-28 ENCOUNTER — Encounter: Payer: Self-pay | Admitting: Family Medicine

## 2024-06-28 DIAGNOSIS — E278 Other specified disorders of adrenal gland: Secondary | ICD-10-CM | POA: Insufficient documentation

## 2024-06-28 DIAGNOSIS — R911 Solitary pulmonary nodule: Secondary | ICD-10-CM | POA: Insufficient documentation

## 2024-06-28 DIAGNOSIS — D1803 Hemangioma of intra-abdominal structures: Secondary | ICD-10-CM | POA: Insufficient documentation

## 2024-07-18 ENCOUNTER — Ambulatory Visit

## 2024-07-18 VITALS — BP 114/73 | HR 74 | Ht 65.0 in | Wt 204.2 lb

## 2024-07-18 DIAGNOSIS — S86891A Other injury of other muscle(s) and tendon(s) at lower leg level, right leg, initial encounter: Secondary | ICD-10-CM

## 2024-07-18 DIAGNOSIS — S86892A Other injury of other muscle(s) and tendon(s) at lower leg level, left leg, initial encounter: Secondary | ICD-10-CM | POA: Diagnosis not present

## 2024-07-18 NOTE — Patient Instructions (Signed)
 Teresa [name],   It was great seeing you in clinic today! You came in for pain in both your legs.  I think this is something called shin splints, which is due to overuse and tightness of the muscles.  This could be a result of trying out some fancier shoes, so I would recommend wearing good supportive shoes while you recover from this.  You can use massage balls or freeze a water bottle and use it as a roller on the front of your legs to help with the muscle pain.  I would continue to use heat as well.  You can also use the baclofen  as needed, and ibuprofen  for severe pain.  This should get better with time and rest, but if it is worsening or if it gets better but then starts getting worse again, please reach out to us  for another appointment.  Please bring all your medications to your next office visit.  Thank you for allowing me to be a part of your care team! Alan Flies, MD [facility] [address] [place], [place], [place] [address] [phone]

## 2024-07-18 NOTE — Progress Notes (Signed)
" ° ° °  SUBJECTIVE:   CHIEF COMPLAINT / HPI:   Teresa [name] is a 64 y.o. female presenting with bilateral leg pain.  Bilateral Leg Pain Reports she started with leg pain in her left leg a week ago. No new activity prior. On Monday, she started having pain in her right leg as well; reports she had boots on and was walking more. Has been wearing heels on Sundays, recently. Pain is in the front shin region bilaterally. Pain radiates up into her left back, and down into her feet. Has used an ankle brace and knee brace. Pain first thing in the morning, pain when she gets off work and sits down. Pain with flexing feet. No falls, no loss of balance. Using a heating pad on legs. Tried tramadol  last night and baclofen  prn, both helped. Tried voltaren  gel, not helping. Trying compression stockings, help a little bit. No tingling/numbness. No loss of sensation. No cramping, no calf pain. No skin changes. No significant weight loss.   PERTINENT  PMH / PSH: HTN, PVD, CAD, OSA, GERD, hypothyroid, CKD II, hx CVA  OBJECTIVE:   BP 114/73   Pulse 74   Ht 5' 5 (1.651 m)   Wt 204 lb 3.2 oz (92.6 kg)   SpO2 97%   BMI 33.98 kg/m    General: Patient seated in chair, no acute distress. Resp: Normal work of breathing on room air Back: No spinal point tenderness to palpation. MSK: Grossly normal appearance of bilateral legs.  Equal sensation of bilateral lower extremities.  Pain to palpation of anterior lateral muscle of both legs bilaterally; muscles tight bilaterally.  No pain to palpation of calves, no pain to palpation around knee joint.  Right knee with small prepatellar bursal effusion.  ASSESSMENT/PLAN:   Assessment & Plan Shin splints of both lower extremities Given description of symptoms and physical exam findings, most likely etiology is shin splints.  No red flag symptoms, no need for further imaging or workup. - Supportive care measures such as massage, heat, ice, rest, appropriate footwear  discussed; handout on shins plints provided - Return precautions discussed; patient already has appointment scheduled 07/26/2024 and can follow-up on pain then   Follow-up as scheduled.  Alan Flies, MD [facility] "

## 2024-07-19 ENCOUNTER — Ambulatory Visit

## 2024-07-19 VITALS — BP 149/84 | HR 68 | Temp 98.0°F | Resp 16 | Ht 65.0 in | Wt 204.4 lb

## 2024-07-19 DIAGNOSIS — I2510 Atherosclerotic heart disease of native coronary artery without angina pectoris: Secondary | ICD-10-CM

## 2024-07-19 DIAGNOSIS — Z8673 Personal history of transient ischemic attack (TIA), and cerebral infarction without residual deficits: Secondary | ICD-10-CM | POA: Diagnosis not present

## 2024-07-19 DIAGNOSIS — E7849 Other hyperlipidemia: Secondary | ICD-10-CM | POA: Diagnosis not present

## 2024-07-19 MED ORDER — INCLISIRAN SODIUM 284 MG/1.5ML ~~LOC~~ SOSY
284.0000 mg | PREFILLED_SYRINGE | Freq: Once | SUBCUTANEOUS | Status: AC
  Administered 2024-07-19: 284 mg via SUBCUTANEOUS
  Filled 2024-07-19: qty 1.5

## 2024-07-19 NOTE — Progress Notes (Signed)
 Diagnosis: Hyperlipidemia  Provider:  Chilton Greathouse MD  Procedure: Injection  Leqvio (inclisiran), Dose: 284 mg, Site: subcutaneous, Number of injections: 1  Injection Site(s): Right arm  Post Care:  right arm injection  Discharge: Condition: Good, Destination: Home . AVS Declined  Performed by:  Rico Ala, LPN

## 2024-07-21 ENCOUNTER — Encounter: Payer: Self-pay | Admitting: Family Medicine

## 2024-07-23 ENCOUNTER — Ambulatory Visit: Admitting: Student

## 2024-07-23 ENCOUNTER — Encounter: Payer: Self-pay | Admitting: Family Medicine

## 2024-07-23 DIAGNOSIS — E785 Hyperlipidemia, unspecified: Secondary | ICD-10-CM | POA: Diagnosis not present

## 2024-07-23 NOTE — Telephone Encounter (Signed)
 Patient is schedule for a virtual visit today at 130

## 2024-07-23 NOTE — Progress Notes (Signed)
 [facility] Telemedicine Visit  Patient consented to have virtual visit and was identified by name and date of birth. Method of visit: Telephone  Encounter participants: Patient: Sanaia Shepherd - located at home Provider: Damien Pinal - located at home  Chief Complaint: Initiating Omega 3   HPI: Patient would like to begin supplementation with Omega 3. She is interested to know if there were any drug interactions with the supplement.   Currently she is on Hydralazine  100 mg TID, Spironolactone  100 mg daily, and Coreg  25 mg BID for blood pressure control.  She takes Synthroid  for hypothyroidism Occasionally will take Baclofen  for muscle aches but this is not daily  Takes cetrizine for daily allergy coverage  Currently on Semaglutide  weekly  Takes Leqvio  for hypercholesterolemia   Doing well on her medications with no reported side effects.   ROS: per HPI  Pertinent PMHx: as above   Exam:  There were no vitals taken for this visit.  Respiratory: speaking in complete sentences. Patient denies difficulty breathing.   Assessment & Plan Hyperlipidemia, unspecified hyperlipidemia type Discussed starting omega 3 supplementation with patient  Reviewed her medications and updated the medication list  Discussed safe supplements including using USP approved omega 3  Patient will need repeat BMP, lipid and A1c in the next 2-3 weeks  I discussed this with her and she will let us  know when she is ready for lab work     Time spent during visit with patient: 20 minutes

## 2024-07-23 NOTE — Assessment & Plan Note (Addendum)
 Discussed starting omega 3 supplementation with patient  Reviewed her medications and updated the medication list  Discussed safe supplements including using USP approved omega 3  Patient will need repeat BMP, lipid and A1c in the next 2-3 weeks  I discussed this with her and she will let us  know when she is ready for lab work

## 2024-07-26 ENCOUNTER — Ambulatory Visit: Admitting: Family Medicine

## 2024-07-26 ENCOUNTER — Encounter: Payer: Self-pay | Admitting: Family Medicine

## 2024-07-26 VITALS — BP 152/91 | HR 71 | Wt 203.0 lb

## 2024-07-26 DIAGNOSIS — M791 Myalgia, unspecified site: Secondary | ICD-10-CM | POA: Diagnosis not present

## 2024-07-26 DIAGNOSIS — E1122 Type 2 diabetes mellitus with diabetic chronic kidney disease: Secondary | ICD-10-CM

## 2024-07-26 DIAGNOSIS — E039 Hypothyroidism, unspecified: Secondary | ICD-10-CM | POA: Diagnosis not present

## 2024-07-26 DIAGNOSIS — E278 Other specified disorders of adrenal gland: Secondary | ICD-10-CM

## 2024-07-26 DIAGNOSIS — I1A0 Resistant hypertension: Secondary | ICD-10-CM

## 2024-07-26 DIAGNOSIS — I129 Hypertensive chronic kidney disease with stage 1 through stage 4 chronic kidney disease, or unspecified chronic kidney disease: Secondary | ICD-10-CM | POA: Diagnosis not present

## 2024-07-26 DIAGNOSIS — N182 Chronic kidney disease, stage 2 (mild): Secondary | ICD-10-CM | POA: Diagnosis not present

## 2024-07-26 DIAGNOSIS — E119 Type 2 diabetes mellitus without complications: Secondary | ICD-10-CM

## 2024-07-26 DIAGNOSIS — E669 Obesity, unspecified: Secondary | ICD-10-CM | POA: Diagnosis not present

## 2024-07-26 NOTE — Progress Notes (Signed)
 "   SUBJECTIVE:   CHIEF COMPLAINT / HPI:      SUBJECTIVE:   CHIEF COMPLAINT / HPI:   Discussed the use of AI scribe software for clinical note transcription with the patient, who gave verbal consent to proceed.  History of Present Illness   Teresa [name] is a 65 year old female with hypertension who presents with bilateral leg pain and burning sensation.  She has been experiencing severe pain and a burning sensation in both legs, particularly in the thighs, for the past three days. The pain is sharp and stabbing, occurring even when sitting, with the left thigh more affected than the right. The intensity of the pain has been severe enough to disturb her sleep, although it has lessened somewhat but remains present.  The pain in her leg started first and was aggravated after she walked over a mile in uncomfortable boots, which she believes may have contributed to the discomfort. She has been using diclofenac  gel and a muscle relaxant for relief, which has helped alleviate some of the pain. Additionally, she uses a back massage heater at night to manage the discomfort.  She mentions a history of injections in the thighs but currently administers her semaglutide  (Ozempic ) injections in the abdomen and has not given any recent injections in the thighs over 6 weeks now.  Her home BP are usually normal. Her current medications include Coreg  25 mg twice daily, hydralazine  100 mg three times daily and Aldactone  100 mg daily. She has not taken her morning medications yet, which she usually takes after dropping off her granddaughter.   She is on Synthroid  175 mcg daily for hypothyroidism and, semaglutide  1 mg weekly for diabetes.       PERTINENT  PMH / PSH: PMHx reviewed  OBJECTIVE:   Vitals:   07/26/24 0842 07/26/24 0907  BP: (!) 143/93 (!) 152/91  Pulse: 71   SpO2: 98%   Weight: 203 lb (92.1 kg)     BP (!) 152/91   Pulse 71   Wt 203 lb (92.1 kg)   SpO2 98%   BMI 33.78 kg/m    Physical Exam   VITALS: BP- 152/91, SaO2- 98% CHEST: Lungs clear to auscultation. CARDIOVASCULAR: Heart sounds normal, no murmurs. EXTREMITIES: No calf swelling, erythema, or tenderness in bilateral legs. No erythema or swelling in bilateral thighs. Mild tenderness on lateral surface of left thigh.     Chaperon - Thersia Meissner   ASSESSMENT/PLAN:   Assessment & Plan Myalgia Myalgia of bilateral thighs Previous hx with Statin use - now off Statin Bilateral thigh myalgia with burning sensation, more pronounced in the left thigh. Symptoms possibly related to recent physical activity. Differential diagnosis includes muscle spasm, or side effects from inclisiran (LEQVIO ) 284 MG/1.5ML 6 monthly injections. Pain improved with diclofenac  gel and muscle relaxants. - Ordered muscle enzyme tests (CK and myoglobin levels). - Ordered autoimmune tests to rule out autoimmune conditions. - Continue diclofenac  gel for pain management. - Continue muscle relaxants as needed. - Scheduled follow-up appointment in four weeks. Hypothyroidism, unspecified type Managed with Synthroid  (levothyroxine ) 175 mcg daily. - Continue Synthroid  175 mcg daily - TSH checked today Resistant hypertension Managed with Coreg , hydralazine , and Aldactone . Blood pressure elevated at 152/91 mmHg, possibly due to missed morning doses. - Scheduled follow-up appointment in four weeks with instructions to take medications before the appointment. Type 2 DM with CKD stage 2 and hypertension (HCC) Managed with semaglutide  (Ozempic ) 1 mg weekly. A1c test was not completed during the last visit. -  Added A1c test to current blood work. - Unable to cllect POC - Will contact her with the test results Adrenal hyperplasia      Otto Fairly, MD [facility]  "

## 2024-07-26 NOTE — Assessment & Plan Note (Addendum)
 Managed with Coreg , hydralazine , and Aldactone . Blood pressure elevated at 152/91 mmHg, possibly due to missed morning doses. - Scheduled follow-up appointment in four weeks with instructions to take medications before the appointment.

## 2024-07-26 NOTE — Assessment & Plan Note (Signed)
 Managed with semaglutide  (Ozempic ) 1 mg weekly. A1c test was not completed during the last visit. - Added A1c test to current blood work. - Unable to cllect POC - Will contact her with the test results

## 2024-07-27 ENCOUNTER — Ambulatory Visit: Payer: Self-pay | Admitting: Family Medicine

## 2024-07-29 MED ORDER — LEVOTHYROXINE SODIUM 200 MCG PO TABS: 200.0000 ug | ORAL_TABLET | Freq: Every day | ORAL | 0 refills | Status: AC

## 2024-07-29 NOTE — Addendum Note (Signed)
 Addended by: ANDERS CUMINS T on: 07/29/2024 12:15 PM   Modules accepted: Orders

## 2024-07-30 ENCOUNTER — Telehealth: Payer: Self-pay | Admitting: Pharmacist

## 2024-08-01 NOTE — Telephone Encounter (Signed)
 Spoke with patient today. She confirmed she is not taking Repatha  and has instead been receiving Leqvio , with LDL-C currently at goal on this therapy. She has a documented intolerance to statins. Her most recent Leqvio  dose was administered during the last week of January 2026. The patient reports new leg pain described as stabbing and tender to touch, which began on July 16, 2024, and has been progressively worsening. She also reported medication changes: she was switched from Ozempic  to Mounjaro  for improved weight-loss response but developed injection-site reactions (swelling and redness). Her PCP subsequently switched her back to Ozempic . Patient additionally noted she typically experiences a cough after Leqvio  injections, which usually subsides after 3-4 months. Following her most recent dose, the cough has returned. She also feels her CPAP device may be contributing, as it may need more thorough cleaning. Given her history of type 2 diabetes, peripheral neuropathy is a possible contributor to her leg symptoms. She was advised to follow up with her PCP for further evaluation and management.  Patient expressed appreciation for the outreach and the information provided

## 2024-08-02 LAB — BASIC METABOLIC PANEL WITH GFR
BUN/Creatinine Ratio: 14 (ref 12–28)
BUN: 16 mg/dL (ref 8–27)
CO2: 22 mmol/L (ref 20–29)
Calcium: 9.9 mg/dL (ref 8.7–10.3)
Chloride: 100 mmol/L (ref 96–106)
Creatinine, Ser: 1.13 mg/dL — ABNORMAL HIGH (ref 0.57–1.00)
Glucose: 85 mg/dL (ref 70–99)
Potassium: 4.7 mmol/L (ref 3.5–5.2)
Sodium: 137 mmol/L (ref 134–144)
eGFR: 55 mL/min/{1.73_m2} — ABNORMAL LOW

## 2024-08-02 LAB — HEMOGLOBIN A1C
Est. average glucose Bld gHb Est-mCnc: 123 mg/dL
Hgb A1c MFr Bld: 5.9 % — ABNORMAL HIGH (ref 4.8–5.6)

## 2024-08-02 LAB — MYOGLOBIN, SERUM: Myoglobin: 31 ng/mL (ref 25–58)

## 2024-08-02 LAB — ANTI-CCP AB, IGG + IGA (RDL): Anti-CCP Ab, IgG + IgA (RDL): <20 U

## 2024-08-02 LAB — TSH: TSH: 6.9 u[IU]/mL — ABNORMAL HIGH (ref 0.450–4.500)

## 2024-08-02 LAB — ANA: Anti Nuclear Antibody (ANA): NEGATIVE

## 2024-08-02 LAB — CK: Total CK: 106 U/L (ref 32–182)

## 2024-08-02 LAB — RHEUMATOID FACTOR: Rheumatoid fact SerPl-aCnc: <10 [IU]/mL

## 2024-08-30 ENCOUNTER — Ambulatory Visit: Admitting: Family Medicine

## 2024-10-07 ENCOUNTER — Encounter

## 2025-01-16 ENCOUNTER — Ambulatory Visit
